# Patient Record
Sex: Female | Born: 1940
Health system: Southern US, Community
[De-identification: ages and names within clinical notes are randomized; demographics above are authoritative.]

## PROBLEM LIST (undated history)

## (undated) DIAGNOSIS — N289 Disorder of kidney and ureter, unspecified: Secondary | ICD-10-CM

## (undated) DIAGNOSIS — K829 Disease of gallbladder, unspecified: Secondary | ICD-10-CM

## (undated) DIAGNOSIS — C189 Malignant neoplasm of colon, unspecified: Secondary | ICD-10-CM

## (undated) DIAGNOSIS — E119 Type 2 diabetes mellitus without complications: Secondary | ICD-10-CM

## (undated) DIAGNOSIS — R112 Nausea with vomiting, unspecified: Secondary | ICD-10-CM

## (undated) DIAGNOSIS — I350 Nonrheumatic aortic (valve) stenosis: Secondary | ICD-10-CM

## (undated) DIAGNOSIS — R0600 Dyspnea, unspecified: Secondary | ICD-10-CM

## (undated) DIAGNOSIS — K760 Fatty (change of) liver, not elsewhere classified: Secondary | ICD-10-CM

## (undated) DIAGNOSIS — E039 Hypothyroidism, unspecified: Secondary | ICD-10-CM

## (undated) DIAGNOSIS — I10 Essential (primary) hypertension: Secondary | ICD-10-CM

## (undated) DIAGNOSIS — M810 Age-related osteoporosis without current pathological fracture: Secondary | ICD-10-CM

## (undated) DIAGNOSIS — T7840XA Allergy, unspecified, initial encounter: Secondary | ICD-10-CM

## (undated) DIAGNOSIS — F419 Anxiety disorder, unspecified: Secondary | ICD-10-CM

## (undated) DIAGNOSIS — Z8739 Personal history of other diseases of the musculoskeletal system and connective tissue: Secondary | ICD-10-CM

## (undated) DIAGNOSIS — R011 Cardiac murmur, unspecified: Secondary | ICD-10-CM

## (undated) DIAGNOSIS — M1990 Unspecified osteoarthritis, unspecified site: Secondary | ICD-10-CM

## (undated) DIAGNOSIS — D649 Anemia, unspecified: Secondary | ICD-10-CM

## (undated) DIAGNOSIS — Z9889 Other specified postprocedural states: Secondary | ICD-10-CM

## (undated) DIAGNOSIS — K219 Gastro-esophageal reflux disease without esophagitis: Secondary | ICD-10-CM

## (undated) HISTORY — DX: Disease of gallbladder, unspecified: K82.9

## (undated) HISTORY — DX: Disorder of kidney and ureter, unspecified: N28.9

## (undated) HISTORY — DX: Anxiety disorder, unspecified: F41.9

## (undated) HISTORY — DX: Fatty (change of) liver, not elsewhere classified: K76.0

## (undated) HISTORY — PX: TUBAL LIGATION: SHX77

## (undated) HISTORY — PX: CHOLECYSTECTOMY: SHX55

## (undated) HISTORY — DX: Allergy, unspecified, initial encounter: T78.40XA

## (undated) HISTORY — PX: CATARACT EXTRACTION: SUR2

## (undated) HISTORY — PX: KNEE SURGERY: SHX244

## (undated) HISTORY — DX: Age-related osteoporosis without current pathological fracture: M81.0

## (undated) HISTORY — PX: KNEE CARTILAGE SURGERY: SHX688

---

## 2000-01-03 ENCOUNTER — Other Ambulatory Visit: Admission: RE | Admit: 2000-01-03 | Discharge: 2000-01-03 | Payer: Self-pay | Admitting: Family Medicine

## 2000-09-26 ENCOUNTER — Encounter: Payer: Self-pay | Admitting: Family Medicine

## 2000-09-26 ENCOUNTER — Encounter: Admission: RE | Admit: 2000-09-26 | Discharge: 2000-09-26 | Payer: Self-pay | Admitting: Family Medicine

## 2001-11-11 ENCOUNTER — Encounter: Admission: RE | Admit: 2001-11-11 | Discharge: 2001-11-11 | Payer: Self-pay | Admitting: Family Medicine

## 2001-11-11 ENCOUNTER — Encounter: Payer: Self-pay | Admitting: Family Medicine

## 2002-12-10 ENCOUNTER — Encounter: Payer: Self-pay | Admitting: Family Medicine

## 2002-12-10 ENCOUNTER — Encounter: Admission: RE | Admit: 2002-12-10 | Discharge: 2002-12-10 | Payer: Self-pay | Admitting: Family Medicine

## 2002-12-29 ENCOUNTER — Other Ambulatory Visit: Admission: RE | Admit: 2002-12-29 | Discharge: 2002-12-29 | Payer: Self-pay | Admitting: Obstetrics and Gynecology

## 2004-02-04 ENCOUNTER — Encounter: Admission: RE | Admit: 2004-02-04 | Discharge: 2004-02-04 | Payer: Self-pay | Admitting: Family Medicine

## 2004-09-06 ENCOUNTER — Ambulatory Visit: Payer: Self-pay | Admitting: Family Medicine

## 2005-02-01 ENCOUNTER — Ambulatory Visit: Payer: Self-pay | Admitting: Family Medicine

## 2005-03-22 ENCOUNTER — Encounter: Admission: RE | Admit: 2005-03-22 | Discharge: 2005-03-22 | Payer: Self-pay | Admitting: Family Medicine

## 2005-03-22 ENCOUNTER — Ambulatory Visit: Payer: Self-pay | Admitting: Family Medicine

## 2005-05-03 ENCOUNTER — Other Ambulatory Visit: Admission: RE | Admit: 2005-05-03 | Discharge: 2005-05-03 | Payer: Self-pay | Admitting: Obstetrics and Gynecology

## 2005-07-05 ENCOUNTER — Ambulatory Visit: Payer: Self-pay | Admitting: Family Medicine

## 2005-10-11 ENCOUNTER — Ambulatory Visit: Payer: Self-pay | Admitting: Family Medicine

## 2005-11-08 ENCOUNTER — Ambulatory Visit: Payer: Self-pay | Admitting: Family Medicine

## 2006-02-28 ENCOUNTER — Ambulatory Visit: Payer: Self-pay | Admitting: Family Medicine

## 2006-05-21 ENCOUNTER — Ambulatory Visit: Payer: Self-pay | Admitting: Family Medicine

## 2006-06-04 ENCOUNTER — Ambulatory Visit: Payer: Self-pay | Admitting: Family Medicine

## 2006-06-27 ENCOUNTER — Encounter: Admission: RE | Admit: 2006-06-27 | Discharge: 2006-06-27 | Payer: Self-pay | Admitting: Family Medicine

## 2006-10-01 ENCOUNTER — Ambulatory Visit: Payer: Self-pay | Admitting: Family Medicine

## 2006-12-05 ENCOUNTER — Ambulatory Visit: Payer: Self-pay | Admitting: Family Medicine

## 2007-11-28 ENCOUNTER — Encounter: Admission: RE | Admit: 2007-11-28 | Discharge: 2007-11-28 | Payer: Self-pay | Admitting: Family Medicine

## 2009-03-16 DEATH — deceased

## 2009-05-31 ENCOUNTER — Encounter: Admission: RE | Admit: 2009-05-31 | Discharge: 2009-05-31 | Payer: Self-pay | Admitting: Family Medicine

## 2010-08-01 ENCOUNTER — Encounter
Admission: RE | Admit: 2010-08-01 | Discharge: 2010-08-01 | Payer: Self-pay | Source: Home / Self Care | Attending: Family Medicine | Admitting: Family Medicine

## 2011-02-07 ENCOUNTER — Emergency Department (HOSPITAL_COMMUNITY): Payer: Medicare Other

## 2011-02-07 ENCOUNTER — Emergency Department (HOSPITAL_COMMUNITY)
Admission: EM | Admit: 2011-02-07 | Discharge: 2011-02-08 | Disposition: A | Discharge status: Home / Self Care | Payer: Medicare Other | Attending: Emergency Medicine | Admitting: Emergency Medicine

## 2011-02-07 DIAGNOSIS — E039 Hypothyroidism, unspecified: Secondary | ICD-10-CM | POA: Insufficient documentation

## 2011-02-07 DIAGNOSIS — E119 Type 2 diabetes mellitus without complications: Secondary | ICD-10-CM | POA: Insufficient documentation

## 2011-02-07 DIAGNOSIS — R350 Frequency of micturition: Secondary | ICD-10-CM | POA: Insufficient documentation

## 2011-02-07 DIAGNOSIS — N200 Calculus of kidney: Secondary | ICD-10-CM | POA: Insufficient documentation

## 2011-02-07 DIAGNOSIS — R109 Unspecified abdominal pain: Secondary | ICD-10-CM | POA: Insufficient documentation

## 2011-02-07 DIAGNOSIS — Q618 Other cystic kidney diseases: Secondary | ICD-10-CM | POA: Insufficient documentation

## 2011-02-07 DIAGNOSIS — I10 Essential (primary) hypertension: Secondary | ICD-10-CM | POA: Insufficient documentation

## 2011-02-07 DIAGNOSIS — Z9089 Acquired absence of other organs: Secondary | ICD-10-CM | POA: Insufficient documentation

## 2011-02-07 LAB — CBC
HCT: 44.2 % (ref 36.0–46.0)
MCH: 31 pg (ref 26.0–34.0)
MCHC: 33 g/dL (ref 30.0–36.0)
RDW: 13.5 % (ref 11.5–15.5)

## 2011-02-07 LAB — URINALYSIS, ROUTINE W REFLEX MICROSCOPIC
Protein, ur: NEGATIVE mg/dL
Urobilinogen, UA: 0.2 mg/dL (ref 0.0–1.0)

## 2011-02-07 LAB — COMPREHENSIVE METABOLIC PANEL
Albumin: 4.1 g/dL (ref 3.5–5.2)
Alkaline Phosphatase: 122 U/L — ABNORMAL HIGH (ref 39–117)
BUN: 12 mg/dL (ref 6–23)
Calcium: 10 mg/dL (ref 8.4–10.5)
Creatinine, Ser: 0.9 mg/dL (ref 0.50–1.10)
GFR calc Af Amer: >60 mL/min (ref >60)
Potassium: 4 mEq/L (ref 3.5–5.1)
Total Protein: 7.9 g/dL (ref 6.0–8.3)

## 2011-02-07 LAB — DIFFERENTIAL
Basophils Absolute: 0 10*3/uL (ref 0.0–0.1)
Basophils Relative: 0 % (ref 0–1)
Eosinophils Relative: 1 % (ref 0–5)
Lymphocytes Relative: 41 % (ref 12–46)
Monocytes Absolute: 0.5 10*3/uL (ref 0.1–1.0)
Monocytes Relative: 6 % (ref 3–12)

## 2011-02-07 LAB — URINE MICROSCOPIC-ADD ON

## 2011-02-07 LAB — LIPASE, BLOOD: Lipase: 34 U/L (ref 11–59)

## 2011-10-16 ENCOUNTER — Encounter (HOSPITAL_COMMUNITY): Payer: Self-pay | Admitting: Emergency Medicine

## 2011-10-16 ENCOUNTER — Inpatient Hospital Stay (HOSPITAL_COMMUNITY)
Admission: EM | Admit: 2011-10-16 | Discharge: 2011-10-18 | DRG: 684 | Disposition: A | Discharge status: Home / Self Care | Payer: Medicare Other | Attending: Internal Medicine | Admitting: Internal Medicine

## 2011-10-16 DIAGNOSIS — I10 Essential (primary) hypertension: Secondary | ICD-10-CM | POA: Diagnosis present

## 2011-10-16 DIAGNOSIS — R5381 Other malaise: Secondary | ICD-10-CM | POA: Diagnosis present

## 2011-10-16 DIAGNOSIS — E039 Hypothyroidism, unspecified: Secondary | ICD-10-CM | POA: Diagnosis present

## 2011-10-16 DIAGNOSIS — A084 Viral intestinal infection, unspecified: Secondary | ICD-10-CM | POA: Diagnosis present

## 2011-10-16 DIAGNOSIS — R7302 Impaired glucose tolerance (oral): Secondary | ICD-10-CM | POA: Diagnosis present

## 2011-10-16 DIAGNOSIS — R5383 Other fatigue: Secondary | ICD-10-CM | POA: Diagnosis present

## 2011-10-16 DIAGNOSIS — K219 Gastro-esophageal reflux disease without esophagitis: Secondary | ICD-10-CM | POA: Diagnosis present

## 2011-10-16 DIAGNOSIS — R7309 Other abnormal glucose: Secondary | ICD-10-CM | POA: Diagnosis present

## 2011-10-16 DIAGNOSIS — A088 Other specified intestinal infections: Secondary | ICD-10-CM | POA: Diagnosis present

## 2011-10-16 DIAGNOSIS — N179 Acute kidney failure, unspecified: Secondary | ICD-10-CM

## 2011-10-16 HISTORY — DX: Essential (primary) hypertension: I10

## 2011-10-16 HISTORY — DX: Other specified postprocedural states: Z98.890

## 2011-10-16 HISTORY — DX: Nausea with vomiting, unspecified: R11.2

## 2011-10-16 LAB — DIFFERENTIAL
Basophils Absolute: 0 10*3/uL (ref 0.0–0.1)
Basophils Relative: 0 % (ref 0–1)
Eosinophils Absolute: 0.1 10*3/uL (ref 0.0–0.7)
Eosinophils Relative: 1 % (ref 0–5)
Lymphocytes Relative: 34 % (ref 12–46)
Lymphs Abs: 2.7 10*3/uL (ref 0.7–4.0)
Monocytes Absolute: 0.5 10*3/uL (ref 0.1–1.0)
Monocytes Relative: 6 % (ref 3–12)
Neutro Abs: 4.6 10*3/uL (ref 1.7–7.7)
Neutrophils Relative %: 58 % (ref 43–77)

## 2011-10-16 LAB — BASIC METABOLIC PANEL
BUN: 51 mg/dL — ABNORMAL HIGH (ref 6–23)
CO2: 18 mEq/L — ABNORMAL LOW (ref 19–32)
Calcium: 8.9 mg/dL (ref 8.4–10.5)
Chloride: 102 mEq/L (ref 96–112)
Creatinine, Ser: 2.22 mg/dL — ABNORMAL HIGH (ref 0.50–1.10)
GFR calc Af Amer: 25 mL/min — ABNORMAL LOW (ref >90)
GFR calc non Af Amer: 21 mL/min — ABNORMAL LOW (ref >90)
Glucose, Bld: 201 mg/dL — ABNORMAL HIGH (ref 70–99)
Potassium: 3.5 mEq/L (ref 3.5–5.1)
Sodium: 136 mEq/L (ref 135–145)

## 2011-10-16 LAB — CBC
HCT: 44.4 % (ref 36.0–46.0)
Hemoglobin: 14.8 g/dL (ref 12.0–15.0)
MCH: 31.8 pg (ref 26.0–34.0)
MCHC: 33.3 g/dL (ref 30.0–36.0)
MCV: 95.3 fL (ref 78.0–100.0)
Platelets: 186 10*3/uL (ref 150–400)
RBC: 4.66 MIL/uL (ref 3.87–5.11)
RDW: 14.3 % (ref 11.5–15.5)
WBC: 7.9 10*3/uL (ref 4.0–10.5)

## 2011-10-16 LAB — GLUCOSE, CAPILLARY: Glucose-Capillary: 106 mg/dL — ABNORMAL HIGH (ref 70–99)

## 2011-10-16 MED ORDER — LEVOTHYROXINE SODIUM 88 MCG PO TABS
88.0000 ug | ORAL_TABLET | Freq: Every day | ORAL | Status: DC
  Administered 2011-10-17 – 2011-10-18 (×2): 88 ug via ORAL
  Filled 2011-10-16 (×2): qty 1

## 2011-10-16 MED ORDER — ENOXAPARIN SODIUM 30 MG/0.3ML ~~LOC~~ SOLN
30.0000 mg | SUBCUTANEOUS | Status: DC
  Administered 2011-10-17: 30 mg via SUBCUTANEOUS
  Filled 2011-10-16 (×3): qty 0.3

## 2011-10-16 MED ORDER — ACETAMINOPHEN 325 MG PO TABS: 650.0000 mg | ORAL_TABLET | Freq: Four times a day (QID) | ORAL | Status: DC | PRN

## 2011-10-16 MED ORDER — SODIUM CHLORIDE 0.9 % IV SOLN
INTRAVENOUS | Status: DC
  Administered 2011-10-16 – 2011-10-17 (×5): via INTRAVENOUS

## 2011-10-16 MED ORDER — ONDANSETRON HCL 4 MG PO TABS: 4.0000 mg | ORAL_TABLET | Freq: Four times a day (QID) | ORAL | Status: DC | PRN

## 2011-10-16 MED ORDER — MORPHINE SULFATE 4 MG/ML IJ SOLN
4.0000 mg | Freq: Once | INTRAMUSCULAR | Status: AC
  Administered 2011-10-16: 4 mg via INTRAVENOUS
  Filled 2011-10-16: qty 1

## 2011-10-16 MED ORDER — SODIUM CHLORIDE 0.9 % IV BOLUS (SEPSIS)
1000.0000 mL | Freq: Once | INTRAVENOUS | Status: AC
  Administered 2011-10-16: 1000 mL via INTRAVENOUS

## 2011-10-16 MED ORDER — SODIUM CHLORIDE 0.9 % IV SOLN
1000.0000 mL | Freq: Once | INTRAVENOUS | Status: AC
  Administered 2011-10-16: 1000 mL via INTRAVENOUS

## 2011-10-16 MED ORDER — ACETAMINOPHEN 650 MG RE SUPP: 650.0000 mg | Freq: Four times a day (QID) | RECTAL | Status: DC | PRN

## 2011-10-16 MED ORDER — ONDANSETRON HCL 4 MG/2ML IJ SOLN
4.0000 mg | Freq: Four times a day (QID) | INTRAMUSCULAR | Status: DC | PRN
  Administered 2011-10-16: 4 mg via INTRAVENOUS
  Filled 2011-10-16: qty 2

## 2011-10-16 MED ORDER — ONDANSETRON 8 MG/NS 50 ML IVPB
8.0000 mg | Freq: Once | INTRAVENOUS | Status: AC
  Administered 2011-10-16: 8 mg via INTRAVENOUS
  Filled 2011-10-16: qty 8

## 2011-10-16 MED ORDER — OXYCODONE HCL 5 MG PO TABS: 5.0000 mg | ORAL_TABLET | ORAL | Status: DC | PRN

## 2011-10-16 MED ORDER — PANTOPRAZOLE SODIUM 40 MG PO TBEC
40.0000 mg | DELAYED_RELEASE_TABLET | Freq: Every day | ORAL | Status: DC
  Administered 2011-10-16 – 2011-10-18 (×3): 40 mg via ORAL
  Filled 2011-10-16 (×3): qty 1

## 2011-10-16 NOTE — ED Notes (Signed)
Pt states she has had N/V/D since Saturday night  Pt states when she drinks anything it makes her stomach hurt and it goes straight thru her  Pt states she just feels sick all over

## 2011-10-16 NOTE — Progress Notes (Signed)
   CARE MANAGEMENT NOTE 10/16/2011  Patient:  Cheryl Jordan, Cheryl Jordan   Account Number:  [id number]  Date Initiated:  10/16/2011  Documentation initiated by:  [facility]  Subjective/Objective Assessment:   ADMITTED W/N/V/D.HX:HTN     Action/Plan:   FROM HOME W/SPOUSE   Anticipated DC Date:  10/19/2011   Anticipated DC Plan:  HOME/SELF CARE         Choice offered to / List presented to:             Status of service:  In process, will continue to follow Medicare Important Message given?   (If response is "NO", the following Medicare IM given date fields will be blank) Date Medicare IM given:   Date Additional Medicare IM given:    Discharge Disposition:    Per UR Regulation:  Reviewed for med. necessity/level of care/duration of stay  If discussed at [place] of Stay Meetings, dates discussed:    Comments:  10/16/11 [facility] RN,BSN NCM [id number]

## 2011-10-16 NOTE — H&P (Addendum)
PCP:   Dr. Dione Housekeeper   Chief Complaint:  N/V/D  HPI: Cheryl 71 y/o white Jordan with a h/o HTN, GERD, Hypothyroidism and borderline DM, has been having n/v and diarrhea since Saturday night. Her husband and sister in law have been sick as well with the same symptoms. The vomiting has now stopped, however she continues to have over 18 episodes of watery diarrhea a day. She comes to the ED to seek assistance for her GI symptoms. She is found to have ARF with a Cr of 2.22. [place] measured Cr in the system is of 0.9 in 7/12. We are asked to admit her for further evaluation and management.  Allergies:   Allergies  Allergen Reactions  . Aspirin   . Codeine       Past Medical History  Diagnosis Date  . Hypertension   . Thyroid disease   . Borderline diabetes     Past Surgical History  Procedure Date  . Cholecystectomy   . Knee surgery   . Tubal ligation     Prior to Admission medications   Medication Sig Start Date End Date Taking? Authorizing Provider  amLODipine (NORVASC) 5 MG tablet Take 5 mg by mouth daily.   Yes Historical Provider, MD  atenolol (TENORMIN) 50 MG tablet Take 50 mg by mouth daily.   Yes Historical Provider, MD  levothyroxine (SYNTHROID, LEVOTHROID) 88 MCG tablet Take 88 mcg by mouth daily.   Yes Historical Provider, MD  losartan (COZAAR) 50 MG tablet Take 25 mg by mouth daily.   Yes Historical Provider, MD  metFORMIN (GLUCOPHAGE) 500 MG tablet Take 500 mg by mouth 2 (two) times daily with a meal.   Yes Historical Provider, MD  omeprazole (PRILOSEC) 40 MG capsule Take 40 mg by mouth daily.   Yes Historical Provider, MD  rosuvastatin (CRESTOR) 5 MG tablet Take 2.5 mg by mouth daily.   Yes Historical Provider, MD    Social History:  reports that she has never smoked. She does not have any smokeless tobacco history on file. She reports that she does not drink alcohol or use illicit drugs.  Family History  Problem Relation Age of Onset  . Diabetes Mother     . Hypertension Other     Review of Systems:  Negative except as mentioned in HPI.  Physical Exam: Blood pressure 122/51, pulse 79, temperature 97.8 F (36.6 C), temperature source Oral, resp. rate 17, SpO2 91.00%. Gen: AAOx3, no distress. HEENT: [place]/AT/PERRL/EOMI, very dry mucous membranes. Neck: supple, no JVD, no LAD, no bruits, no goiter. CV: RRR, no M/R/G. Lungs: CTA B Abdomen: S/NT/ND/+BS/ no masses or organomegaly noted. Ext: no C/C/E, + pedal pulses. Neuro: grossly intact and nonfocal.  Labs on Admission:  Results for orders placed during the hospital encounter of 10/16/11 (from the past 48 hour(s))  CBC     Status: Normal   Collection Time   10/16/11  4:35 AM      Component Value Range Comment   WBC 7.9  4.0 - 10.5 (K/uL)    RBC 4.66  3.87 - 5.11 (MIL/uL)    Hemoglobin 14.8  12.0 - 15.0 (g/dL)    HCT 44.4  36.0 - 46.0 (%)    MCV 95.3  78.0 - 100.0 (fL)    MCH 31.8  26.0 - 34.0 (pg)    MCHC 33.3  30.0 - 36.0 (g/dL)    RDW 14.3  11.5 - 15.5 (%)    Platelets 186  150 - 400 (K/uL)  DIFFERENTIAL     Status: Normal   Collection Time   10/16/11  4:35 AM      Component Value Range Comment   Neutrophils Relative 58  43 - 77 (%)    Neutro Abs 4.6  1.7 - 7.7 (K/uL)    Lymphocytes Relative 34  12 - 46 (%)    Lymphs Abs 2.7  0.7 - 4.0 (K/uL)    Monocytes Relative 6  3 - 12 (%)    Monocytes Absolute 0.5  0.1 - 1.0 (K/uL)    Eosinophils Relative 1  0 - 5 (%)    Eosinophils Absolute 0.1  0.0 - 0.7 (K/uL)    Basophils Relative 0  0 - 1 (%)    Basophils Absolute 0.0  0.0 - 0.1 (K/uL)   BASIC METABOLIC PANEL     Status: Abnormal   Collection Time   10/16/11  4:35 AM      Component Value Range Comment   Sodium 136  135 - 145 (mEq/L)    Potassium 3.5  3.5 - 5.1 (mEq/L)    Chloride 102  96 - 112 (mEq/L)    CO2 18 (*) 19 - 32 (mEq/L)    Glucose, Bld 201 (*) 70 - 99 (mg/dL)    BUN 51 (*) 6 - 23 (mg/dL)    Creatinine, Ser 2.22 (*) 0.50 - 1.10 (mg/dL)    Calcium 8.9  8.4 - 10.5  (mg/dL)    GFR calc non Af Amer 21 (*) >90 (mL/min)    GFR calc Af Amer 25 (*) >90 (mL/min)     Radiological Exams on Admission: No results found.  Assessment/Plan Principal Problem:  *ARF (acute renal failure) Active Problems:  Viral gastroenteritis  HTN (hypertension)  Hypothyroidism  GERD (gastroesophageal reflux disease)  Glucose intolerance (impaired glucose tolerance)   #1 ARF: Likely prerenal 2/2 GI losses on top of ARB. Hydrate with NS and follow Cr in am. Hold losartan.  #2 N/V/Diarrhea: Likely viral gastroenteritis. Should be self limiting. Follow.  #3 HTN: hold all anti-hypertensive meds.  #4 Hypothyroidism: Check TSH. Continue home synthroid dose.  #5 Glucose Intolerance: Follow CBGs. Cover if needed.  #6 DVT Prophylaxis: Lovenox.  Time Spent on Admission: 60 minutes.  Lelon Frohlich [facility] Pager: [phone] 10/16/2011, 8:27 AM

## 2011-10-16 NOTE — ED Provider Notes (Cosign Needed)
History    70yf with n/v/d. Onset 2d ago. Persistent since. Mild crampy and diffuse abdominal pain. No radiation. No fever or chills. NO sick contacts. No urinary complaints. No CP or SOB. No blood in stool or emesis.  CSN: [id number]  Arrival date & time 10/16/11  [id number]   First MD Initiated Contact with Patient 10/16/11 0445      Chief Complaint  Patient presents with  . Emesis  . Diarrhea    (Consider location/radiation/quality/duration/timing/severity/associated sxs/prior treatment) HPI  Past Medical History  Diagnosis Date  . Hypertension   . Thyroid disease   . Borderline diabetes     Past Surgical History  Procedure Date  . Cholecystectomy   . Knee surgery   . Tubal ligation     Family History  Problem Relation Age of Onset  . Diabetes Mother   . Hypertension Other     History  Substance Use Topics  . Smoking status: Never Smoker   . Smokeless tobacco: Not on file  . Alcohol Use: No    OB History    Grav Para Term Preterm Abortions TAB SAB Ect Mult Living                  Review of Systems   Review of symptoms negative unless otherwise noted in HPI.   Allergies  Aspirin and Codeine  Home Medications   Current Outpatient Rx  Name Route Sig Dispense Refill  . AMLODIPINE BESYLATE 5 MG PO TABS Oral Take 5 mg by mouth daily.    . ATENOLOL 50 MG PO TABS Oral Take 50 mg by mouth daily.    Marland Kitchen LEVOTHYROXINE SODIUM 88 MCG PO TABS Oral Take 88 mcg by mouth daily.    Marland Kitchen LOSARTAN POTASSIUM 50 MG PO TABS Oral Take 25 mg by mouth daily.    Marland Kitchen METFORMIN HCL 500 MG PO TABS Oral Take 500 mg by mouth 2 (two) times daily with a meal.    . OMEPRAZOLE 40 MG PO CPDR Oral Take 40 mg by mouth daily.    Marland Kitchen ROSUVASTATIN CALCIUM 5 MG PO TABS Oral Take 2.5 mg by mouth daily.      BP 146/68  Pulse 87  Temp(Src) 97.8 F (36.6 C) (Oral)  Resp 20  SpO2 95%  Physical Exam  Nursing note and vitals reviewed. Constitutional:       Sitting up in bed. Uncomfortable  appearing. Obese.  HENT:  Head: Normocephalic and atraumatic.  Eyes: Conjunctivae are normal. Pupils are equal, round, and reactive to light. Right eye exhibits no discharge. Left eye exhibits no discharge.  Neck: Neck supple.  Cardiovascular: Normal rate, regular rhythm and normal heart sounds.  Exam reveals no gallop and no friction rub.   No murmur heard. Pulmonary/Chest: Effort normal and breath sounds normal. No respiratory distress.  Abdominal: Soft. She exhibits no distension. There is tenderness.       Mild diffuse tenderness without rebound or guarding.  Musculoskeletal: She exhibits no edema and no tenderness.  Neurological: She is alert.  Skin: Skin is warm and dry. She is not diaphoretic.  Psychiatric: She has a normal mood and affect. Her behavior is normal. Thought content normal.    ED Course  Procedures (including critical care time)  Labs Reviewed  BASIC METABOLIC PANEL - Abnormal; Notable for the following:    CO2 18 (*)    Glucose, Bld 201 (*)    BUN 51 (*)    Creatinine, Ser 2.22 (*)  GFR calc non Af Amer 21 (*)    GFR calc Af Amer 25 (*)    All other components within normal limits  CBC  DIFFERENTIAL   No results found.   1. Acute renal failure   2. Vomiting 3. Diarrhea 4. Dehydration   MDM  70yF with n/v/d. Suspect viral illness. Presumably acute renal failure, likely prerenal from GI loss. Given age and ongoing symptoms making oral tolerance difficult at this time will admit.        Virgel Manifold, MD 10/18/11 [id number]  Virgel Manifold, MD 10/18/11 [phone]

## 2011-10-17 LAB — CBC
MCH: 30.8 pg (ref 26.0–34.0)
MCHC: 31.9 g/dL (ref 30.0–36.0)
MCV: 96.5 fL (ref 78.0–100.0)
Platelets: 133 10*3/uL — ABNORMAL LOW (ref 150–400)
RDW: 14.2 % (ref 11.5–15.5)
WBC: 5.5 10*3/uL (ref 4.0–10.5)

## 2011-10-17 LAB — BASIC METABOLIC PANEL
BUN: 30 mg/dL — ABNORMAL HIGH (ref 6–23)
CO2: 19 mEq/L (ref 19–32)
Calcium: 8.1 mg/dL — ABNORMAL LOW (ref 8.4–10.5)
Creatinine, Ser: 1.3 mg/dL — ABNORMAL HIGH (ref 0.50–1.10)

## 2011-10-17 LAB — GLUCOSE, CAPILLARY: Glucose-Capillary: 116 mg/dL — ABNORMAL HIGH (ref 70–99)

## 2011-10-17 LAB — CLOSTRIDIUM DIFFICILE BY PCR: Toxigenic C. Difficile by PCR: NEGATIVE

## 2011-10-17 MED ORDER — ATENOLOL 50 MG PO TABS
50.0000 mg | ORAL_TABLET | Freq: Every day | ORAL | Status: DC
  Administered 2011-10-17 – 2011-10-18 (×2): 50 mg via ORAL
  Filled 2011-10-17 (×2): qty 1

## 2011-10-17 MED ORDER — INSULIN ASPART 100 UNIT/ML ~~LOC~~ SOLN: 0.0000 [IU] | Freq: Every day | SUBCUTANEOUS | Status: DC

## 2011-10-17 MED ORDER — INSULIN ASPART 100 UNIT/ML ~~LOC~~ SOLN: 0.0000 [IU] | Freq: Three times a day (TID) | SUBCUTANEOUS | Status: DC

## 2011-10-17 NOTE — Progress Notes (Addendum)
Patient ID: Cheryl Jordan, female   DOB: 08/12/1940, 71 y.o.   MRN: [id number]  Assessment/Plan:  Principal Problem:   *ARF (acute renal failure) - secondary to dehydration versus ARB - losartan on hold - continue IV fluids for now - creatinine improving - follow up BMP in am  Active Problems:   Viral gastroenteritis - patient continue to have diarrhea but reports feeling better - continue IV fluids - follow up C. Diff results   HTN (hypertension) - restart atenolol 50 mg daily - hold losartan   Hypothyroidism - continue levothyroxine   GERD (gastroesophageal reflux disease) - continue protonix   Glucose intolerance (impaired glucose tolerance) - sliding scale insulin - continue CBG monitoring - hold metformin in light of acute renal failure and possible infection   EDUCATION - test results and diagnostic studies were discussed with patient and pt's family who was present at the bedside - patient and family have verbalized the understanding - questions were answered at the bedside and contact information was provided for additional questions or concerns   Subjective: No events overnight. Patient denies chest pain, shortness of breath, abdominal pain. Continues to have diarrhea.  Objective:  Vital signs in last 24 hours:  Filed Vitals:   10/16/11 1135 10/16/11 1435 10/16/11 2105 10/17/11 0605  BP: 110/66 123/71 129/70 135/75  Pulse: 71 80 75 77  Temp: 97.9 F (36.6 C) 98 F (36.7 C) 98 F (36.7 C) 98 F (36.7 C)  TempSrc: Oral Oral Oral Oral  Resp: 16 18 18 18   Height: 5\' 8"  (1.727 m)     Weight: 95.255 kg (210 lb)     SpO2: 93% 94% 92% 93%    Intake/Output from previous day:   Intake/Output Summary (Last 24 hours) at 10/17/11 1158 Last data filed at 10/17/11 0600  Gross per 24 hour  Intake    587 ml  Output      1 ml  Net    586 ml    Physical Exam: General: Alert, awake, oriented x3, in no acute distress. HEENT: No bruits, no goiter. Moist  mucous membranes, no scleral icterus, no conjunctival pallor. Heart: Regular rate and rhythm, S1/S2 +, no murmurs, rubs, gallops. Lungs: Clear to auscultation bilaterally. No wheezing, no rhonchi, no rales.  Abdomen: Soft, nontender, nondistended, positive bowel sounds. Extremities: No clubbing or cyanosis, no pitting edema,  positive pedal pulses. Neuro: Grossly nonfocal.  Lab Results:  Lab 10/17/11 0415 10/16/11 0435  WBC 5.5 7.9  HGB 12.2 14.8  HCT 38.2 44.4  PLT 133* 186  MCV 96.5 95.3    Lab 10/17/11 0415 10/16/11 0435  NA 138 136  K 3.9 3.5  CL 111 102  CO2 19 18*  GLUCOSE 109* 201*  BUN 30* 51*  CREATININE 1.30* 2.22*  CALCIUM 8.1* 8.9     Medications: Scheduled Meds:   . enoxaparin  30 mg Subcutaneous Q24H  . levothyroxine  88 mcg Oral QAC breakfast  . pantoprazole  40 mg Oral Q1200   Continuous Infusions:   . sodium chloride 125 mL/hr at 10/17/11 0720   PRN Meds:.acetaminophen, acetaminophen, ondansetron (ZOFRAN) IV, ondansetron, oxyCODONE   LOS: 1 day   Cheryl Jordan 10/17/2011, 11:58 AM  [facility] Pager: [phone]

## 2011-10-17 NOTE — Progress Notes (Signed)
Pt w/o nausea/vomiting.Tolerating Clear Liquid Diet.  Diet advanced to Full Liquid.Harlene Ramus

## 2011-10-18 LAB — CBC
MCH: 31.3 pg (ref 26.0–34.0)
MCHC: 33.3 g/dL (ref 30.0–36.0)
MCV: 93.8 fL (ref 78.0–100.0)
Platelets: 127 10*3/uL — ABNORMAL LOW (ref 150–400)
RDW: 13.7 % (ref 11.5–15.5)

## 2011-10-18 LAB — BASIC METABOLIC PANEL
Calcium: 8.6 mg/dL (ref 8.4–10.5)
Creatinine, Ser: 0.94 mg/dL (ref 0.50–1.10)
GFR calc non Af Amer: 61 mL/min — ABNORMAL LOW (ref >90)
Sodium: 141 mEq/L (ref 135–145)

## 2011-10-18 MED ORDER — ENOXAPARIN SODIUM 40 MG/0.4ML ~~LOC~~ SOLN
40.0000 mg | SUBCUTANEOUS | Status: DC
  Filled 2011-10-18: qty 0.4

## 2011-10-18 NOTE — Discharge Summary (Signed)
Patient ID: Cheryl Jordan MRN: [id number] DOB/AGE: 1941-03-13 71 y.o.  Admit date: 10/16/2011 Discharge date: 10/18/2011  Primary Care Physician:  No primary provider on file.  Discharge Diagnoses:    Present on Admission:  .ARF (acute renal failure) .Viral gastroenteritis .HTN (hypertension) .Hypothyroidism .GERD (gastroesophageal reflux disease) .Glucose intolerance (impaired glucose tolerance)  Principal Problem:  *ARF (acute renal failure) Active Problems:  Viral gastroenteritis  HTN (hypertension)  Hypothyroidism  GERD (gastroesophageal reflux disease)  Glucose intolerance (impaired glucose tolerance)   Medication List  As of 10/18/2011  8:42 AM   TAKE these medications         amLODipine 5 MG tablet   Commonly known as: NORVASC   Take 5 mg by mouth daily.      atenolol 50 MG tablet   Commonly known as: TENORMIN   Take 50 mg by mouth daily.      levothyroxine 88 MCG tablet   Commonly known as: SYNTHROID, LEVOTHROID   Take 88 mcg by mouth daily.      losartan 50 MG tablet   Commonly known as: COZAAR   Take 25 mg by mouth daily.      metFORMIN 500 MG tablet   Commonly known as: GLUCOPHAGE   Take 500 mg by mouth 2 (two) times daily with a meal.      omeprazole 40 MG capsule   Commonly known as: PRILOSEC   Take 40 mg by mouth daily.      rosuvastatin 5 MG tablet   Commonly known as: CRESTOR   Take 2.5 mg by mouth daily.            Disposition and Follow-up:  - medically stable for discharge - please follow up with PCP to re evaluate kidney function and whether losartan and metformin can be continued for now  Consults:  None requested on this admission  Significant Diagnostic Studies:  No results found.  Brief H and P:  71 y/o white woman with a h/o HTN, GERD, Hypothyroidism and borderline DM, presented with intractable nausea and vomiting and diarrhea. Patient reports multiple episodes of diarrhea. Patient also reported feeling very weak few days  prior to admission. Admitted to hospitalist service for further evaluation and management.  Physical Exam on Discharge:  Filed Vitals:   10/17/11 0605 10/17/11 1335 10/17/11 2210 10/18/11 0352  BP: 135/75 155/77 125/68 134/63  Pulse: 77 74 62 62  Temp: 98 F (36.7 C) 97.8 F (36.6 C) 98.3 F (36.8 C) 98.4 F (36.9 C)  TempSrc: Oral Oral Oral Oral  Resp: 18 18 17 18   Height:      Weight:      SpO2: 93% 95% 95% 95%     Intake/Output Summary (Last 24 hours) at 10/18/11 0842 Last data filed at 10/17/11 1400  Gross per 24 hour  Intake    867 ml  Output      0 ml  Net    867 ml    General: Alert, awake, oriented x3, in no acute distress. HEENT: No bruits, no goiter. Heart: Regular rate and rhythm, without murmurs, rubs, gallops. Lungs: Clear to auscultation bilaterally. Abdomen: Soft, nontender, nondistended, positive bowel sounds. Extremities: No clubbing cyanosis or edema with positive pedal pulses. Neuro: Grossly intact, nonfocal.  CBC:    Component Value Date/Time   WBC 4.5 10/18/2011 0814   HGB 12.6 10/18/2011 0814   HCT 37.8 10/18/2011 0814   PLT 127* 10/18/2011 0814   MCV 93.8 10/18/2011 0814   NEUTROABS  4.6 10/16/2011 0435   LYMPHSABS 2.7 10/16/2011 0435   MONOABS 0.5 10/16/2011 0435   EOSABS 0.1 10/16/2011 0435   BASOSABS 0.0 10/16/2011 [id number]    Basic Metabolic Panel:    Component Value Date/Time   NA 138 10/17/2011 0415   K 3.9 10/17/2011 0415   CL 111 10/17/2011 0415   CO2 19 10/17/2011 0415   BUN 30* 10/17/2011 0415   CREATININE 1.30* 10/17/2011 0415   GLUCOSE 109* 10/17/2011 0415   CALCIUM 8.1* 10/17/2011 0415   Assessment/Plan:   Principal Problem:   *ARF (acute renal failure)  - secondary to dehydration versus ARB  - losartan was on hold; may resume on discharge but to follow up with PCP to re evaluate kidney function  - creatinine improving   Active Problems:   Viral gastroenteritis  - patient reports feeling better with no episodes of diarrhea in  last 12 hours -  follow up C. Diff results negative  HTN (hypertension)  - restart atenolol 50 mg daily   Hypothyroidism  - continue levothyroxine   GERD (gastroesophageal reflux disease)  - continue protonix   Glucose intolerance (impaired glucose tolerance)  - held metformin in light of acute renal failure and possible infection  - may resume on discharge but with follow up  With PCP to reassess kidney function  EDUCATION  - test results and diagnostic studies were discussed with patient and pt's family who was present at the bedside  - patient and family have verbalized the understanding  - questions were answered at the bedside    Time spent on Discharge: Greater than 30 minutes spent on coordinating the care and preparing discharge summary  Signed: [place], [place] 10/18/2011, 8:42 AM

## 2011-10-18 NOTE — Discharge Instructions (Signed)
Viral Gastroenteritis Viral gastroenteritis is also known as stomach flu. This condition affects the stomach and intestinal tract. It can cause sudden diarrhea and vomiting. The illness typically lasts 3 to 8 days. Most people develop an immune response that eventually gets rid of the virus. While this natural response develops, the virus can make you quite ill. CAUSES  Many different viruses can cause gastroenteritis, such as rotavirus or noroviruses. You can catch one of these viruses by consuming contaminated food or water. You may also catch a virus by sharing utensils or other personal items with an infected person or by touching a contaminated surface. SYMPTOMS  The most common symptoms are diarrhea and vomiting. These problems can cause a severe loss of body fluids (dehydration) and a body salt (electrolyte) imbalance. Other symptoms may include:  Fever.   Headache.   Fatigue.   Abdominal pain.  DIAGNOSIS  Your caregiver can usually diagnose viral gastroenteritis based on your symptoms and a physical exam. A stool sample may also be taken to test for the presence of viruses or other infections. TREATMENT  This illness typically goes away on its own. Treatments are aimed at rehydration. The most serious cases of viral gastroenteritis involve vomiting so severely that you are not able to keep fluids down. In these cases, fluids must be given through an intravenous line (IV). HOME CARE INSTRUCTIONS   Drink enough fluids to keep your urine clear or pale yellow. Drink small amounts of fluids frequently and increase the amounts as tolerated.   Ask your caregiver for specific rehydration instructions.   Avoid:   Foods high in sugar.   Alcohol.   Carbonated drinks.   Tobacco.   Juice.   Caffeine drinks.   Extremely hot or cold fluids.   Fatty, greasy foods.   Too much intake of anything at one time.   Dairy products until 24 to 48 hours after diarrhea stops.   You may  consume probiotics. Probiotics are active cultures of beneficial bacteria. They may lessen the amount and number of diarrheal stools in adults. Probiotics can be found in yogurt with active cultures and in supplements.   Wash your hands well to avoid spreading the virus.   Only take over-the-counter or prescription medicines for pain, discomfort, or fever as directed by your caregiver. Do not give aspirin to children. Antidiarrheal medicines are not recommended.   Ask your caregiver if you should continue to take your regular prescribed and over-the-counter medicines.   Keep all follow-up appointments as directed by your caregiver.  SEEK IMMEDIATE MEDICAL CARE IF:   You are unable to keep fluids down.   You do not urinate at least once every 6 to 8 hours.   You develop shortness of breath.   You notice blood in your stool or vomit. This may look like coffee grounds.   You have abdominal pain that increases or is concentrated in one small area (localized).   You have persistent vomiting or diarrhea.   You have a fever.   The patient is a child younger than 3 months, and he or she has a fever.   The patient is a child older than 3 months, and he or she has a fever and persistent symptoms.   The patient is a child older than 3 months, and he or she has a fever and symptoms suddenly get worse.   The patient is a baby, and he or she has no tears when crying.  MAKE SURE YOU:     Understand these instructions.   Will watch your condition.   Will get help right away if you are not doing well or get worse.  Document Released: 07/02/2005 Document Revised: 06/21/2011 Document Reviewed: 04/18/2011 [employer] Patient Information 2012 [employer]. 

## 2011-10-18 NOTE — Evaluation (Signed)
Physical Therapy Evaluation Patient Details Name: Cheryl Jordan MRN: [id number] DOB: January 07, 1941 Today's Date: 10/18/2011  Problem List:  Patient Active Problem List  Diagnoses  . ARF (acute renal failure)  . Viral gastroenteritis  . HTN (hypertension)  . Hypothyroidism  . GERD (gastroesophageal reflux disease)  . Glucose intolerance (impaired glucose tolerance)    Past Medical History:  Past Medical History  Diagnosis Date  . Hypertension   . Thyroid disease   . Borderline diabetes   . PONV (postoperative nausea and vomiting)    Past Surgical History:  Past Surgical History  Procedure Date  . Cholecystectomy   . Knee surgery   . Tubal ligation     PT Assessment/Plan/Recommendation PT Assessment Clinical Impression Statement: Pt presents with diagnosis of diarrhea and ARF. 1x eval. No follow-up needed. PT Recommendation/Assessment: Patent does not need any further PT services No Skilled PT: Patient is independent with all acitivity/mobility PT Recommendation Follow Up Recommendations: No PT follow up Equipment Recommended: None recommended by PT PT Goals     PT Evaluation Precautions/Restrictions    Prior Functioning  Home Living Lives With: Spouse Receives Help From: Family Type of Home: House Home Layout: Laundry or work area in basement;Two level;Able to live on main level with bedroom/bathroom Home Adaptive Equipment: Walker - rolling Additional Comments: pt not using RW Prior Function Level of Independence: Independent with basic ADLs;Independent with homemaking with ambulation;Independent with gait;Independent with transfers Driving: Yes Cognition Cognition Arousal/Alertness: Awake/alert Overall Cognitive Status: Appears within functional limits for tasks assessed Orientation Level: Oriented X4 Sensation/Coordination Sensation Light Touch: Appears Intact Coordination Gross Motor Movements are Fluid and Coordinated: Yes Extremity Assessment RLE  Assessment RLE Assessment: Within Functional Limits LLE Assessment LLE Assessment: Within Functional Limits Mobility (including Balance) Bed Mobility Bed Mobility: Yes Supine to Sit: 7: Independent Transfers Transfers: Yes Sit to Stand: 7: Independent Stand to Sit: 7: Independent Ambulation/Gait Ambulation/Gait: Yes Ambulation/Gait Assistance: 7: Independent Ambulation Distance (Feet): 450 Feet Assistive device: None Gait Pattern: Within Functional Limits  Posture/Postural Control Posture/Postural Control: No significant limitations Balance Balance Assessed: No Exercise    End of Session PT - End of Session Equipment Utilized During Treatment: Gait belt Activity Tolerance: Patient tolerated treatment well Patient left: in chair;with call bell in reach General Behavior During Session: [facility] for tasks performed Cognition: [facility] for tasks performed  [place]on [facility] 10/18/2011, 11:00 AM [id number]

## 2011-10-22 LAB — GLUCOSE, CAPILLARY
Glucose-Capillary: 134 mg/dL — ABNORMAL HIGH (ref 70–99)
Glucose-Capillary: 124 mg/dL — ABNORMAL HIGH (ref 70–99)
Glucose-Capillary: 141 mg/dL — ABNORMAL HIGH (ref 70–99)

## 2011-10-24 ENCOUNTER — Other Ambulatory Visit: Payer: Self-pay | Admitting: Family Medicine

## 2011-10-24 DIAGNOSIS — Z1231 Encounter for screening mammogram for malignant neoplasm of breast: Secondary | ICD-10-CM

## 2011-11-19 ENCOUNTER — Ambulatory Visit
Admission: RE | Admit: 2011-11-19 | Discharge: 2011-11-19 | Disposition: A | Discharge status: Home / Self Care | Payer: Medicare Other | Source: Ambulatory Visit | Attending: Family Medicine | Admitting: Family Medicine

## 2011-11-19 DIAGNOSIS — Z1231 Encounter for screening mammogram for malignant neoplasm of breast: Secondary | ICD-10-CM

## 2013-01-06 ENCOUNTER — Other Ambulatory Visit: Payer: Self-pay

## 2013-01-06 DIAGNOSIS — Z1231 Encounter for screening mammogram for malignant neoplasm of breast: Secondary | ICD-10-CM

## 2013-01-20 ENCOUNTER — Ambulatory Visit: Payer: Medicare Other

## 2013-01-30 ENCOUNTER — Ambulatory Visit
Admission: RE | Admit: 2013-01-30 | Discharge: 2013-01-30 | Disposition: A | Discharge status: Home / Self Care | Payer: Medicare Other | Source: Ambulatory Visit

## 2013-01-30 DIAGNOSIS — Z1231 Encounter for screening mammogram for malignant neoplasm of breast: Secondary | ICD-10-CM

## 2013-12-17 DIAGNOSIS — M1990 Unspecified osteoarthritis, unspecified site: Secondary | ICD-10-CM | POA: Insufficient documentation

## 2013-12-17 DIAGNOSIS — E1159 Type 2 diabetes mellitus with other circulatory complications: Secondary | ICD-10-CM | POA: Insufficient documentation

## 2013-12-17 DIAGNOSIS — I152 Hypertension secondary to endocrine disorders: Secondary | ICD-10-CM | POA: Insufficient documentation

## 2014-11-18 ENCOUNTER — Other Ambulatory Visit: Payer: Self-pay | Admitting: Gastroenterology

## 2014-11-18 DIAGNOSIS — C180 Malignant neoplasm of cecum: Secondary | ICD-10-CM

## 2014-11-23 ENCOUNTER — Ambulatory Visit
Admission: RE | Admit: 2014-11-23 | Discharge: 2014-11-23 | Disposition: A | Discharge status: Home / Self Care | Payer: Medicare Other | Source: Ambulatory Visit | Attending: Gastroenterology | Admitting: Gastroenterology

## 2014-11-23 DIAGNOSIS — C180 Malignant neoplasm of cecum: Secondary | ICD-10-CM

## 2014-11-23 MED ORDER — IOHEXOL 300 MG/ML  SOLN: 100.0000 mL | Freq: Once | INTRAMUSCULAR | Status: AC | PRN

## 2014-11-24 ENCOUNTER — Other Ambulatory Visit: Payer: Self-pay | Admitting: Gastroenterology

## 2014-11-24 DIAGNOSIS — E611 Iron deficiency: Secondary | ICD-10-CM

## 2014-11-24 DIAGNOSIS — K219 Gastro-esophageal reflux disease without esophagitis: Principal | ICD-10-CM

## 2014-11-24 DIAGNOSIS — IMO0001 Reserved for inherently not codable concepts without codable children: Secondary | ICD-10-CM

## 2014-12-03 ENCOUNTER — Ambulatory Visit
Admission: RE | Admit: 2014-12-03 | Discharge: 2014-12-03 | Disposition: A | Discharge status: Home / Self Care | Payer: Medicare Other | Source: Ambulatory Visit | Attending: Gastroenterology | Admitting: Gastroenterology

## 2014-12-03 DIAGNOSIS — K219 Gastro-esophageal reflux disease without esophagitis: Principal | ICD-10-CM

## 2014-12-03 DIAGNOSIS — IMO0001 Reserved for inherently not codable concepts without codable children: Secondary | ICD-10-CM

## 2014-12-03 DIAGNOSIS — E611 Iron deficiency: Secondary | ICD-10-CM

## 2014-12-08 ENCOUNTER — Ambulatory Visit: Payer: Self-pay | Admitting: Surgery

## 2014-12-08 ENCOUNTER — Other Ambulatory Visit: Payer: Self-pay | Admitting: Surgery

## 2014-12-19 ENCOUNTER — Ambulatory Visit
Admission: RE | Admit: 2014-12-19 | Discharge: 2014-12-19 | Disposition: A | Discharge status: Home / Self Care | Payer: Medicare Other | Source: Ambulatory Visit | Attending: Gastroenterology | Admitting: Gastroenterology

## 2014-12-19 MED ORDER — GADOBENATE DIMEGLUMINE 529 MG/ML IV SOLN
20.0000 mL | Freq: Once | INTRAVENOUS | Status: AC | PRN
  Administered 2014-12-19: 20 mL via INTRAVENOUS

## 2014-12-31 NOTE — Patient Instructions (Addendum)
YOUR PROCEDURE IS SCHEDULED ON : 01/06/15  REPORT TO [facility] MAIN ENTRANCE FOLLOW SIGNS TO SHORT STAY CENTER AT :  5:30 AM  CALL THIS NUMBER IF YOU HAVE PROBLEMS THE MORNING OF SURGERY [phone]  REMEMBER:ONLY 1 PER PERSON MAY GO TO SHORT STAY WITH YOU TO GET READY THE MORNING OF YOUR SURGERY  DO NOT EAT FOOD OR DRINK LIQUIDS AFTER MIDNIGHT  TAKE THESE MEDICINES THE MORNING OF SURGERY:  AMLODIPINE / ATENOLOL / NEXIUM / LEVOTHYROXINE  STOP ASPIRIN / IBUPROFEN / ALEVE / VITAMINS / HERBAL MEDS __5__ DAYS BEFORE SURGERY  FOLLOW BOWEL PREP FROM OFFICE  YOU MAY NOT HAVE ANY METAL ON YOUR BODY INCLUDING HAIR PINS AND PIERCING'S. DO NOT WEAR JEWELRY, MAKEUP, LOTIONS, POWDERS OR PERFUMES. DO NOT WEAR NAIL POLISH. DO NOT SHAVE 48 HRS PRIOR TO SURGERY. MEN MAY SHAVE FACE AND NECK.  DO NOT [place]. [facility] IS NOT RESPONSIBLE FOR VALUABLES.  CONTACTS, DENTURES OR PARTIALS MAY NOT BE WORN TO SURGERY. LEAVE SUITCASE IN CAR. CAN BE BROUGHT TO ROOM AFTER SURGERY.  PATIENTS DISCHARGED THE DAY OF SURGERY WILL NOT BE ALLOWED TO DRIVE HOME.  PLEASE READ OVER THE FOLLOWING INSTRUCTION SHEETS _________________________________________________________________________________                                          [facility] - PREPARING FOR SURGERY  Before surgery, you can play an important role.  Because skin is not sterile, your skin needs to be as free of germs as possible.  You can reduce the number of germs on your skin by washing with CHG (chlorahexidine gluconate) soap before surgery.  CHG is an antiseptic cleaner which kills germs and bonds with the skin to continue killing germs even after washing. Please DO NOT use if you have an allergy to CHG or antibacterial soaps.  If your skin becomes reddened/irritated stop using the CHG and inform your nurse when you arrive at Short Stay. Do not shave (including legs and underarms) for at least 48 hours  prior to the first CHG shower.  You may shave your face. Please follow these instructions carefully:   1.  Shower with CHG Soap the night before surgery and the  morning of Surgery.   2.  If you choose to wash your hair, wash your hair first as usual with your  normal  Shampoo.   3.  After you shampoo, rinse your hair and body thoroughly to remove the  shampoo.                                         4.  Use CHG as you would any other liquid soap.  You can apply chg directly  to the skin and wash . Gently wash with scrungie or clean wascloth    5.  Apply the CHG Soap to your body ONLY FROM THE NECK DOWN.   Do not use on open                           Wound or open sores. Avoid contact with eyes, ears mouth and genitals (private parts).  Genitals (private parts) with your normal soap.              6.  Wash thoroughly, paying special attention to the area where your surgery  will be performed.   7.  Thoroughly rinse your body with warm water from the neck down.   8.  DO NOT shower/wash with your normal soap after using and rinsing off  the CHG Soap .                9.  Pat yourself dry with a clean towel.             10.  Wear clean night clothes to bed after shower             11.  Place clean sheets on your bed the night of your first shower and do not  sleep with pets.  Day of Surgery : Do not apply any lotions/deodorants the morning of surgery.  Please wear clean clothes to the hospital/surgery center.  FAILURE TO FOLLOW THESE INSTRUCTIONS MAY RESULT IN THE CANCELLATION OF YOUR SURGERY    PATIENT SIGNATURE_________________________________  ______________________________________________________________________

## 2015-01-03 ENCOUNTER — Encounter (HOSPITAL_COMMUNITY)
Admission: RE | Admit: 2015-01-03 | Discharge: 2015-01-03 | Disposition: A | Discharge status: Home / Self Care | Payer: Medicare Other | Source: Ambulatory Visit | Attending: Surgery | Admitting: Surgery

## 2015-01-03 ENCOUNTER — Encounter (HOSPITAL_COMMUNITY): Payer: Self-pay

## 2015-01-03 DIAGNOSIS — Z01812 Encounter for preprocedural laboratory examination: Secondary | ICD-10-CM | POA: Diagnosis not present

## 2015-01-03 DIAGNOSIS — R001 Bradycardia, unspecified: Secondary | ICD-10-CM | POA: Insufficient documentation

## 2015-01-03 DIAGNOSIS — Z0183 Encounter for blood typing: Secondary | ICD-10-CM | POA: Insufficient documentation

## 2015-01-03 DIAGNOSIS — E119 Type 2 diabetes mellitus without complications: Secondary | ICD-10-CM | POA: Diagnosis not present

## 2015-01-03 DIAGNOSIS — I10 Essential (primary) hypertension: Secondary | ICD-10-CM | POA: Insufficient documentation

## 2015-01-03 HISTORY — DX: Personal history of other diseases of the musculoskeletal system and connective tissue: Z87.39

## 2015-01-03 HISTORY — DX: Hypothyroidism, unspecified: E03.9

## 2015-01-03 HISTORY — DX: Gastro-esophageal reflux disease without esophagitis: K21.9

## 2015-01-03 HISTORY — DX: Type 2 diabetes mellitus without complications: E11.9

## 2015-01-03 HISTORY — DX: Malignant neoplasm of colon, unspecified: C18.9

## 2015-01-03 LAB — BASIC METABOLIC PANEL
ANION GAP: 8 (ref 5–15)
BUN: 15 mg/dL (ref 6–20)
CHLORIDE: 107 mmol/L (ref 101–111)
CO2: 27 mmol/L (ref 22–32)
CREATININE: 0.95 mg/dL (ref 0.44–1.00)
Calcium: 9.3 mg/dL (ref 8.9–10.3)
GFR calc Af Amer: >60 mL/min (ref >60)
GFR calc non Af Amer: 58 mL/min — ABNORMAL LOW (ref >60)
GLUCOSE: 96 mg/dL (ref 65–99)
Potassium: 4.4 mmol/L (ref 3.5–5.1)
Sodium: 142 mmol/L (ref 135–145)

## 2015-01-03 LAB — CBC
HCT: 35 % — ABNORMAL LOW (ref 36.0–46.0)
Hemoglobin: 10.4 g/dL — ABNORMAL LOW (ref 12.0–15.0)
MCH: 25.2 pg — ABNORMAL LOW (ref 26.0–34.0)
MCHC: 29.7 g/dL — AB (ref 30.0–36.0)
MCV: 84.7 fL (ref 78.0–100.0)
PLATELETS: 146 10*3/uL — AB (ref 150–400)
RBC: 4.13 MIL/uL (ref 3.87–5.11)
RDW: 15.5 % (ref 11.5–15.5)
WBC: 6.3 10*3/uL (ref 4.0–10.5)

## 2015-01-03 LAB — ABO/RH: ABO/RH(D): A POS

## 2015-01-03 NOTE — Progress Notes (Signed)
   01/03/15 1017  OBSTRUCTIVE SLEEP APNEA  Have you ever been diagnosed with sleep apnea through a sleep study? No  Do you snore loudly (loud enough to be heard through closed doors)?  1  Do you often feel tired, fatigued, or sleepy during the daytime? 1  Has anyone observed you stop breathing during your sleep? 0  Do you have, or are you being treated for high blood pressure? 1  BMI more than 35 kg/m2? 0  Age over 73 years old? 1  Neck circumference greater than 40 cm/16 inches? 0  Gender: 0

## 2015-01-05 ENCOUNTER — Encounter (HOSPITAL_COMMUNITY): Payer: Self-pay | Admitting: Surgery

## 2015-01-05 DIAGNOSIS — C189 Malignant neoplasm of colon, unspecified: Secondary | ICD-10-CM | POA: Diagnosis present

## 2015-01-05 NOTE — H&P (Signed)
General Surgery [facility]  Cheryl Jordan DOB: 02-Mar-1941 Married / Language: Undefined / Race: White Female  History of Present Illness Patient words: cecal cancer.  The patient is a 74 year old female who presents with colorectal cancer. Patient referred by Dr. Teena Irani for newly diagnosed adenocarcinoma of the colon. Patient was referred with anemia and heme positive stools. She underwent colonoscopy which showed a mass in the cecum. Biopsy was positive for invasive adenocarcinoma. CT scan of the abdomen and pelvis confirmed a tumor in the cecum. There were also changes in the liver concerning for possible cirrhosis. Patient has no history of liver disease. She has never had hepatitis. She does not consume alcohol. An MRI of the liver has been ordered but not completed at this time. Patient is referred today for evaluation for right colectomy for management of adenocarcinoma of the cecum. Previous abdominal surgery includes tubal ligation and cholecystectomy. Medical problems include hypertension and type 2 diabetes.   Other Problems Colon Cancer Diabetes Mellitus Gastroesophageal Reflux Disease High blood pressure Thyroid Disease  Past Surgical History Colon Polyp Removal - Colonoscopy Gallbladder Surgery - Laparoscopic Knee Surgery Left.  Diagnostic Studies History Colonoscopy within last year Mammogram 1-3 years ago Pap Smear >5 years ago  Allergies  Aspirin *ANALGESICS - NonNarcotic* Codeine Sulfate *ANALGESICS - OPIOID*  Medication History  LORazepam (0.5MG  Tablet, Oral) Active. AmLODIPine Besylate (5MG  Tablet, Oral) Active. Atenolol (50MG  Tablet, Oral) Active. GlipiZIDE XL (5MG  Tablet ER 24HR, Oral) Active. Synthroid (88MCG Tablet, Oral) Active. Omeprazole (40MG  Capsule DR, Oral) Active. Losartan Potassium (50MG  Tablet, Oral) Active. Iron (Ferrous Gluconate) (325MG  Tablet, Oral) Active. Medications  Reconciled  Social History  Caffeine use Carbonated beverages. No alcohol use No drug use Tobacco use Never smoker.  Family History  Diabetes Mellitus Mother. Hypertension Mother.  Pregnancy / Birth History  Age at menarche 57 years. Age of menopause 79-50 Gravida 2 Maternal age 68-25 Para 2  Review of Systems  General Present- Fatigue. Not Present- Appetite Loss, Chills, Fever, Night Sweats, Weight Gain and Weight Loss. Skin Not Present- Change in Wart/Mole, Dryness, Hives, Jaundice, New Lesions, Non-Healing Wounds, Rash and Ulcer. HEENT Present- Wears glasses/contact lenses. Not Present- Earache, Hearing Loss, Hoarseness, Nose Bleed, Oral Ulcers, Ringing in the Ears, Seasonal Allergies, Sinus Pain, Sore Throat, Visual Disturbances and Yellow Eyes. Respiratory Present- Snoring. Not Present- Bloody sputum, Chronic Cough, Difficulty Breathing and Wheezing. Cardiovascular Not Present- Chest Pain, Difficulty Breathing Lying Down, Leg Cramps, Palpitations, Rapid Heart Rate, Shortness of Breath and Swelling of Extremities. Gastrointestinal Present- Excessive gas. Not Present- Abdominal Pain, Bloating, Bloody Stool, Change in Bowel Habits, Chronic diarrhea, Constipation, Difficulty Swallowing, Gets full quickly at meals, Hemorrhoids, Indigestion, Nausea, Rectal Pain and Vomiting. Female Genitourinary Not Present- Frequency, Nocturia, Painful Urination, Pelvic Pain and Urgency. Musculoskeletal Not Present- Back Pain, Joint Pain, Joint Stiffness, Muscle Pain, Muscle Weakness and Swelling of Extremities. Neurological Not Present- Decreased Memory, Fainting, Headaches, Numbness, Seizures, Tingling, Tremor, Trouble walking and Weakness. Endocrine Present- New Diabetes. Not Present- Cold Intolerance, Excessive Hunger, Hair Changes, Heat Intolerance and Hot flashes. Hematology Not Present- Easy Bruising, Excessive bleeding, Gland problems, HIV and Persistent Infections.   Vitals   Weight: 211.8 lb Height: 68in Body Surface Area: 2.15 m Body Mass Index: 32.2 kg/m Temp.: 98.39F(Oral)  Pulse: 66 (Regular)  BP: 136/88 (Sitting, Left Arm, Standard)    Physical Exam  General - appears comfortable, no distress; not diaphorectic  HEENT - normocephalic; sclerae clear, gaze conjugate; mucous membranes moist, dentition  good; voice normal  Neck - symmetric on extension; no palpable anterior or posterior cervical adenopathy; no palpable masses in the thyroid bed  Chest - clear bilaterally without rhonchi, rales, or wheeze  Cor - regular rhythm with normal rate; no significant murmur  Abd - soft without distension; well-healed surgical incision; no palpable masses; no hepatosplenomegaly; no tenderness  Ext - non-tender without significant edema or lymphedema  Neuro - grossly intact; no tremor    Assessment & Plan  ADENOCARCINOMA OF COLON (153.9  C18.9)  Patient is referred with newly diagnosed adenocarcinoma of the colon. She is accompanied by her family. I provided them with written literature on colon surgery to review at home.  Patient has an adenocarcinoma which is biopsy proven involving the cecum. She has undergone a CT scan which suggested changes of cirrhosis in the liver and possible liver mass. An MRI of the abdomen to better evaluate the liver has been scheduled.  I have recommended proceeding with right colectomy. We discussed the procedure. We discussed the need for a bowel prep. We discussed the hospital stay to be anticipated. We discussed the potential for infection. We discussed the potential need for adjuvant chemotherapy following surgery. Patient understands and wishes to proceed with surgery in the near future.  The risks and benefits of the procedure have been discussed at length with the patient. The patient understands the proposed procedure, potential alternative treatments, and the course of recovery to be expected. All of the  patient's questions have been answered at this time. The patient wishes to proceed with surgery.  Earnstine Regal, MD, [facility] Office: [phone]

## 2015-01-06 ENCOUNTER — Inpatient Hospital Stay (HOSPITAL_COMMUNITY): Admission: RE | Admit: 2015-01-06 | Payer: Medicare Other | Source: Ambulatory Visit | Admitting: Surgery

## 2015-01-06 ENCOUNTER — Encounter (HOSPITAL_COMMUNITY): Admission: RE | Payer: Self-pay | Source: Ambulatory Visit

## 2015-01-06 LAB — TYPE AND SCREEN
ABO/RH(D): A POS
Antibody Screen: NEGATIVE

## 2015-01-06 SURGERY — COLECTOMY, PARTIAL
Anesthesia: General

## 2015-01-24 ENCOUNTER — Other Ambulatory Visit: Payer: Self-pay | Admitting: Urology

## 2015-02-04 NOTE — Patient Instructions (Addendum)
Cheryl Jordan  02/04/2015   Your procedure is scheduled on: February 10, 2015  Report to [facility] Main  Entrance take [facility]  elevators to 3rd floor to  [place] at 8:00  AM.  Call this number if you have problems the morning of surgery [phone]   Remember: ONLY 1 PERSON MAY GO WITH YOU TO SHORT STAY TO GET  READY MORNING OF [place].  Do not eat food after midnight Tuesday night.  Follow clear liquid diet until midnight Wednesday night.  Then nothing by mouth.             Bowel prep per physicians instructions.   CLEAR LIQUID DIET   Foods Allowed                                                                     Foods Excluded  Coffee and tea, regular and decaf                             liquids that you cannot  Plain Jell-O in any flavor                                             see through such as: Fruit ices (not with fruit pulp)                                     milk, soups, orange juice  Iced Popsicles                                    All solid food Carbonated beverages, regular and diet                                    Cranberry, grape and apple juices Sports drinks like Gatorade Lightly seasoned clear broth or consume(fat free) Sugar, honey syrup  Sample Menu Breakfast                                Lunch                                     Supper Cranberry juice                    Beef broth                            Chicken broth Jell-O  Grape juice                           Apple juice Coffee or tea                        Jell-O                                      Popsicle                                                Coffee or tea                        Coffee or tea  _____________________________________________________________________       Take these medicines the morning of surgery with A SIP OF WATER:  Amlodipine (Norvasc), Atenolol (Tenormin), Nexium, Levothyroxine                   You may not have any metal on your body including hair pins and              piercings  Do not wear jewelry, make-up, lotions, powders or perfumes, deodorant             Do not wear nail polish.  Do not shave  48 hours prior to surgery.          .   Do not bring valuables to the hospital. [place].  Contacts, dentures or bridgework may not be worn into surgery.  Leave suitcase in the car. After surgery it may be brought to your room.       Special Instructions: coughing and deep breathing exercises, leg exercises              Please read over the following fact sheets you were given: _____________________________________________________________________             [facility] - Preparing for Surgery Before surgery, you can play an important role.  Because skin is not sterile, your skin needs to be as free of germs as possible.  You can reduce the number of germs on your skin by washing with CHG (chlorahexidine gluconate) soap before surgery.  CHG is an antiseptic cleaner which kills germs and bonds with the skin to continue killing germs even after washing. Please DO NOT use if you have an allergy to CHG or antibacterial soaps.  If your skin becomes reddened/irritated stop using the CHG and inform your nurse when you arrive at Short Stay. Do not shave (including legs and underarms) for at least 48 hours prior to the first CHG shower.  You may shave your face/neck. Please follow these instructions carefully:  1.  Shower with CHG Soap the night before surgery and the  morning of Surgery.  2.  If you choose to wash your hair, wash your hair first as usual with your  normal  shampoo.  3.  After you shampoo, rinse your hair and body thoroughly to remove the  shampoo.  4.  Use CHG as you would any other liquid soap.  You can apply chg directly  to the skin and wash                       Gently with a  scrungie or clean washcloth.  5.  Apply the CHG Soap to your body ONLY FROM THE NECK DOWN.   Do not use on face/ open                           Wound or open sores. Avoid contact with eyes, ears mouth and genitals (private parts).                       Wash face,  Genitals (private parts) with your normal soap.             6.  Wash thoroughly, paying special attention to the area where your surgery  will be performed.  7.  Thoroughly rinse your body with warm water from the neck down.  8.  DO NOT shower/wash with your normal soap after using and rinsing off  the CHG Soap.                9.  Pat yourself dry with a clean towel.            10.  Wear clean pajamas.            11.  Place clean sheets on your bed the night of your first shower and do not  sleep with pets. Day of Surgery : Do not apply any lotions/deodorants the morning of surgery.  Please wear clean clothes to the hospital/surgery center.  FAILURE TO FOLLOW THESE INSTRUCTIONS MAY RESULT IN THE CANCELLATION OF YOUR SURGERY PATIENT SIGNATURE_________________________________  NURSE SIGNATURE__________________________________  ________________________________________________________________________

## 2015-02-04 NOTE — Progress Notes (Signed)
02-04-15 - Faxed HGA1C results on patient from 01-20-15 to Dr. Harlow Asa

## 2015-02-04 NOTE — Progress Notes (Addendum)
01-20-15 - CBC in chart 01-20-15 - CMP -in chart 01-20-15- HGA1C - in chart 01-20-15 - Lipid Panel - in chart 01-03-15 - EKG - EPIC 12-20-14 - MR ABD W Wo contrast - EPIC 11-23-14 - CT Abd/Pelvis w/contrast - EPIC

## 2015-02-07 ENCOUNTER — Encounter (HOSPITAL_COMMUNITY)
Admission: RE | Admit: 2015-02-07 | Discharge: 2015-02-07 | Disposition: A | Discharge status: Home / Self Care | Payer: Medicare Other | Source: Ambulatory Visit | Attending: Surgery | Admitting: Surgery

## 2015-02-07 ENCOUNTER — Encounter (HOSPITAL_COMMUNITY): Payer: Self-pay

## 2015-02-07 HISTORY — DX: Unspecified osteoarthritis, unspecified site: M19.90

## 2015-02-07 HISTORY — DX: Anemia, unspecified: D64.9

## 2015-02-07 NOTE — Progress Notes (Signed)
   02/07/15 1003  OBSTRUCTIVE SLEEP APNEA  Have you ever been diagnosed with sleep apnea through a sleep study? No  Do you snore loudly (loud enough to be heard through closed doors)?  1  Do you often feel tired, fatigued, or sleepy during the daytime? 1  Has anyone observed you stop breathing during your sleep? 0  Do you have, or are you being treated for high blood pressure? 1  Age over 73 years old? 1  Obstructive Sleep Apnea Score 4

## 2015-02-09 NOTE — H&P (Signed)
General Surgery [facility]  Cheryl Jordan DOB: [birth year] Married / Language: Undefined / Race: White Female  History of Present Illness Patient words: cecal cancer.  The patient is a 74 year old female who presents with colorectal cancer. Patient referred by Dr. Teena Irani for newly diagnosed adenocarcinoma of the colon. Patient was referred with anemia and heme positive stools. She underwent colonoscopy which showed a mass in the cecum. Biopsy was positive for invasive adenocarcinoma. CT scan of the abdomen and pelvis confirmed a tumor in the cecum. There were also changes in the liver concerning for possible cirrhosis. Patient has no history of liver disease. She has never had hepatitis. She does not consume alcohol. An MRI of the liver has been ordered but not completed at this time. Patient is referred today for evaluation for right colectomy for management of adenocarcinoma of the cecum. Previous abdominal surgery includes tubal ligation and cholecystectomy. Medical problems include hypertension and type 2 diabetes.   Other Problems Colon Cancer Diabetes Mellitus Gastroesophageal Reflux Disease High blood pressure Thyroid Disease  Past Surgical History Colon Polyp Removal - Colonoscopy Gallbladder Surgery - Laparoscopic Knee Surgery Left.  Diagnostic Studies History Colonoscopy within last year Mammogram 1-3 years ago Pap Smear >5 years ago  Allergies Aspirin *ANALGESICS - NonNarcotic* Codeine Sulfate *ANALGESICS - OPIOID*  Medication History LORazepam (0.5MG  Tablet, Oral) Active. AmLODIPine Besylate (5MG  Tablet, Oral) Active. Atenolol (50MG  Tablet, Oral) Active. GlipiZIDE XL (5MG  Tablet ER 24HR, Oral) Active. Synthroid (88MCG Tablet, Oral) Active. Omeprazole (40MG  Capsule DR, Oral) Active. Losartan Potassium (50MG  Tablet, Oral) Active. Iron (Ferrous Gluconate) (325MG  Tablet, Oral) Active. Medications  Reconciled  Social History Caffeine use Carbonated beverages. No alcohol use No drug use Tobacco use Never smoker.  Family History Diabetes Mellitus Mother. Hypertension Mother.  Pregnancy / Birth History Age at menarche 60 years. Age of menopause 40-50 Gravida 2 Maternal age 11-25 Para 2  Review of Systems General Present- Fatigue. Not Present- Appetite Loss, Chills, Fever, Night Sweats, Weight Gain and Weight Loss. Skin Not Present- Change in Wart/Mole, Dryness, Hives, Jaundice, New Lesions, Non-Healing Wounds, Rash and Ulcer. HEENT Present- Wears glasses/contact lenses. Not Present- Earache, Hearing Loss, Hoarseness, Nose Bleed, Oral Ulcers, Ringing in the Ears, Seasonal Allergies, Sinus Pain, Sore Throat, Visual Disturbances and Yellow Eyes. Respiratory Present- Snoring. Not Present- Bloody sputum, Chronic Cough, Difficulty Breathing and Wheezing. Cardiovascular Not Present- Chest Pain, Difficulty Breathing Lying Down, Leg Cramps, Palpitations, Rapid Heart Rate, Shortness of Breath and Swelling of Extremities. Gastrointestinal Present- Excessive gas. Not Present- Abdominal Pain, Bloating, Bloody Stool, Change in Bowel Habits, Chronic diarrhea, Constipation, Difficulty Swallowing, Gets full quickly at meals, Hemorrhoids, Indigestion, Nausea, Rectal Pain and Vomiting. Female Genitourinary Not Present- Frequency, Nocturia, Painful Urination, Pelvic Pain and Urgency. Musculoskeletal Not Present- Back Pain, Joint Pain, Joint Stiffness, Muscle Pain, Muscle Weakness and Swelling of Extremities. Neurological Not Present- Decreased Memory, Fainting, Headaches, Numbness, Seizures, Tingling, Tremor, Trouble walking and Weakness. Endocrine Present- New Diabetes. Not Present- Cold Intolerance, Excessive Hunger, Hair Changes, Heat Intolerance and Hot flashes. Hematology Not Present- Easy Bruising, Excessive bleeding, Gland problems, HIV and Persistent Infections.   Vitals Weight:  211.8 lb Height: 68in Body Surface Area: 2.15 m Body Mass Index: 32.2 kg/m Temp.: 98.61F(Oral)  Pulse: 66 (Regular)  BP: 136/88 (Sitting, Left Arm, Standard)   Physical Exam  General - appears comfortable, no distress; not diaphorectic  HEENT - normocephalic; sclerae clear, gaze conjugate; mucous membranes moist, dentition good; voice normal  Neck - symmetric on  extension; no palpable anterior or posterior cervical adenopathy; no palpable masses in the thyroid bed  Chest - clear bilaterally without rhonchi, rales, or wheeze  Cor - regular rhythm with normal rate; no significant murmur  Abd - soft without distension; well-healed surgical incision; no palpable masses; no hepatosplenomegaly; no tenderness  Ext - non-tender without significant edema or lymphedema  Neuro - grossly intact; no tremor    Assessment & Plan  ADENOCARCINOMA OF COLON (153.9  C18.9)  Patient is referred with newly diagnosed adenocarcinoma of the colon. She is accompanied by her family. I provided them with written literature on colon surgery to review at home.  Patient has an adenocarcinoma which is biopsy proven involving the cecum. She has undergone a CT scan which suggested changes of cirrhosis in the liver and possible liver mass. An MRI of the abdomen to better evaluate the liver has been scheduled.  I have recommended proceeding with right colectomy. We discussed the procedure. We discussed the need for a bowel prep. We discussed the hospital stay to be anticipated. We discussed the potential for infection. We discussed the potential need for adjuvant chemotherapy following surgery. Patient understands and wishes to proceed with surgery in the near future.  The risks and benefits of the procedure have been discussed at length with the patient. The patient understands the proposed procedure, potential alternative treatments, and the course of recovery to be expected. All of the patient's  questions have been answered at this time. The patient wishes to proceed with surgery.  Earnstine Regal, MD, [facility] Office: [phone]

## 2015-02-10 ENCOUNTER — Inpatient Hospital Stay (HOSPITAL_COMMUNITY): Payer: Medicare Other | Admitting: Certified Registered Nurse Anesthetist

## 2015-02-10 ENCOUNTER — Inpatient Hospital Stay (HOSPITAL_COMMUNITY)
Admission: RE | Admit: 2015-02-10 | Discharge: 2015-02-16 | DRG: 330 | Disposition: A | Discharge status: Home Health | Payer: Medicare Other | Source: Ambulatory Visit | Attending: Surgery | Admitting: Surgery

## 2015-02-10 ENCOUNTER — Encounter (HOSPITAL_COMMUNITY): Payer: Self-pay | Admitting: *Deleted

## 2015-02-10 ENCOUNTER — Encounter (HOSPITAL_COMMUNITY)
Admission: RE | Disposition: A | Discharge status: Home Health | Payer: Self-pay | Source: Ambulatory Visit | Attending: Surgery

## 2015-02-10 DIAGNOSIS — I10 Essential (primary) hypertension: Secondary | ICD-10-CM | POA: Diagnosis present

## 2015-02-10 DIAGNOSIS — K219 Gastro-esophageal reflux disease without esophagitis: Secondary | ICD-10-CM | POA: Diagnosis present

## 2015-02-10 DIAGNOSIS — Z86010 Personal history of colonic polyps: Secondary | ICD-10-CM | POA: Diagnosis not present

## 2015-02-10 DIAGNOSIS — E119 Type 2 diabetes mellitus without complications: Secondary | ICD-10-CM | POA: Diagnosis not present

## 2015-02-10 DIAGNOSIS — D649 Anemia, unspecified: Secondary | ICD-10-CM | POA: Diagnosis present

## 2015-02-10 DIAGNOSIS — Z833 Family history of diabetes mellitus: Secondary | ICD-10-CM

## 2015-02-10 DIAGNOSIS — Z79899 Other long term (current) drug therapy: Secondary | ICD-10-CM

## 2015-02-10 DIAGNOSIS — E079 Disorder of thyroid, unspecified: Secondary | ICD-10-CM | POA: Diagnosis present

## 2015-02-10 DIAGNOSIS — Z886 Allergy status to analgesic agent status: Secondary | ICD-10-CM | POA: Diagnosis not present

## 2015-02-10 DIAGNOSIS — C775 Secondary and unspecified malignant neoplasm of intrapelvic lymph nodes: Secondary | ICD-10-CM | POA: Diagnosis not present

## 2015-02-10 DIAGNOSIS — N281 Cyst of kidney, acquired: Secondary | ICD-10-CM | POA: Diagnosis not present

## 2015-02-10 DIAGNOSIS — Z885 Allergy status to narcotic agent status: Secondary | ICD-10-CM | POA: Diagnosis not present

## 2015-02-10 DIAGNOSIS — R110 Nausea: Secondary | ICD-10-CM | POA: Diagnosis not present

## 2015-02-10 DIAGNOSIS — R197 Diarrhea, unspecified: Secondary | ICD-10-CM | POA: Diagnosis not present

## 2015-02-10 DIAGNOSIS — Z8249 Family history of ischemic heart disease and other diseases of the circulatory system: Secondary | ICD-10-CM

## 2015-02-10 DIAGNOSIS — C180 Malignant neoplasm of cecum: Secondary | ICD-10-CM | POA: Diagnosis not present

## 2015-02-10 DIAGNOSIS — C189 Malignant neoplasm of colon, unspecified: Secondary | ICD-10-CM | POA: Diagnosis present

## 2015-02-10 HISTORY — PX: NEPHRECTOMY: SHX65

## 2015-02-10 HISTORY — PX: PARTIAL COLECTOMY: SHX5273

## 2015-02-10 LAB — TYPE AND SCREEN
ABO/RH(D): A POS
ANTIBODY SCREEN: NEGATIVE

## 2015-02-10 LAB — CBC
HCT: 34.3 % — ABNORMAL LOW (ref 36.0–46.0)
HEMOGLOBIN: 10.6 g/dL — AB (ref 12.0–15.0)
MCH: 26.2 pg (ref 26.0–34.0)
MCHC: 30.9 g/dL (ref 30.0–36.0)
MCV: 84.7 fL (ref 78.0–100.0)
Platelets: 228 10*3/uL (ref 150–400)
RBC: 4.05 MIL/uL (ref 3.87–5.11)
RDW: 15.2 % (ref 11.5–15.5)
WBC: 18.5 10*3/uL — ABNORMAL HIGH (ref 4.0–10.5)

## 2015-02-10 LAB — GLUCOSE, CAPILLARY
GLUCOSE-CAPILLARY: 186 mg/dL — AB (ref 65–99)
GLUCOSE-CAPILLARY: 208 mg/dL — AB (ref 65–99)
GLUCOSE-CAPILLARY: 227 mg/dL — AB (ref 65–99)
Glucose-Capillary: 94 mg/dL (ref 65–99)
Glucose-Capillary: 158 mg/dL — ABNORMAL HIGH (ref 65–99)

## 2015-02-10 LAB — CREATININE, SERUM
CREATININE: 1.23 mg/dL — AB (ref 0.44–1.00)
GFR, EST AFRICAN AMERICAN: 49 mL/min — AB (ref >60)
GFR, EST NON AFRICAN AMERICAN: 42 mL/min — AB (ref >60)

## 2015-02-10 SURGERY — COLECTOMY, PARTIAL
Anesthesia: General

## 2015-02-10 MED ORDER — METOPROLOL TARTRATE 1 MG/ML IV SOLN
5.0000 mg | Freq: Four times a day (QID) | INTRAVENOUS | Status: DC | PRN
  Administered 2015-02-14: 5 mg via INTRAVENOUS
  Filled 2015-02-10 (×2): qty 5

## 2015-02-10 MED ORDER — NALOXONE HCL 0.4 MG/ML IJ SOLN: 0.4000 mg | INTRAMUSCULAR | Status: DC | PRN

## 2015-02-10 MED ORDER — SODIUM CHLORIDE 0.9 % IJ SOLN: 9.0000 mL | INTRAMUSCULAR | Status: DC | PRN

## 2015-02-10 MED ORDER — ALVIMOPAN 12 MG PO CAPS
12.0000 mg | ORAL_CAPSULE | Freq: Two times a day (BID) | ORAL | Status: DC
  Administered 2015-02-11 – 2015-02-13 (×5): 12 mg via ORAL
  Filled 2015-02-10 (×6): qty 1

## 2015-02-10 MED ORDER — PROPOFOL INFUSION 10 MG/ML OPTIME
INTRAVENOUS | Status: DC | PRN
  Administered 2015-02-10: 25 ug/kg/min via INTRAVENOUS

## 2015-02-10 MED ORDER — DIPHENHYDRAMINE HCL 12.5 MG/5ML PO ELIX: 12.5000 mg | ORAL_SOLUTION | Freq: Four times a day (QID) | ORAL | Status: DC | PRN

## 2015-02-10 MED ORDER — PROPOFOL 10 MG/ML IV BOLUS
INTRAVENOUS | Status: DC | PRN
  Administered 2015-02-10: 120 mg via INTRAVENOUS

## 2015-02-10 MED ORDER — ONDANSETRON HCL 4 MG/2ML IJ SOLN
INTRAMUSCULAR | Status: DC | PRN
  Administered 2015-02-10: 4 mg via INTRAVENOUS

## 2015-02-10 MED ORDER — LIDOCAINE HCL (CARDIAC) 20 MG/ML IV SOLN
INTRAVENOUS | Status: AC
  Filled 2015-02-10: qty 5

## 2015-02-10 MED ORDER — LEVOTHYROXINE SODIUM 88 MCG PO TABS
88.0000 ug | ORAL_TABLET | Freq: Every day | ORAL | Status: DC
  Administered 2015-02-11 – 2015-02-16 (×6): 88 ug via ORAL
  Filled 2015-02-10 (×8): qty 1

## 2015-02-10 MED ORDER — LACTATED RINGERS IV SOLN
INTRAVENOUS | Status: DC | PRN
  Administered 2015-02-10: 11:00:00 via INTRAVENOUS

## 2015-02-10 MED ORDER — SODIUM CHLORIDE 0.9 % IV SOLN
1.0000 g | INTRAVENOUS | Status: AC
  Administered 2015-02-10: 1 g via INTRAVENOUS
  Filled 2015-02-10: qty 1

## 2015-02-10 MED ORDER — ONDANSETRON HCL 4 MG/2ML IJ SOLN: 4.0000 mg | Freq: Once | INTRAMUSCULAR | Status: DC | PRN

## 2015-02-10 MED ORDER — MORPHINE SULFATE 1 MG/ML IV SOLN
INTRAVENOUS | Status: AC
  Filled 2015-02-10: qty 25

## 2015-02-10 MED ORDER — METOPROLOL TARTRATE 12.5 MG HALF TABLET
12.5000 mg | ORAL_TABLET | Freq: Two times a day (BID) | ORAL | Status: DC
  Administered 2015-02-10 – 2015-02-16 (×12): 12.5 mg via ORAL
  Filled 2015-02-10 (×13): qty 1

## 2015-02-10 MED ORDER — SUGAMMADEX SODIUM 200 MG/2ML IV SOLN
INTRAVENOUS | Status: DC | PRN
  Administered 2015-02-10: 200 mg via INTRAVENOUS

## 2015-02-10 MED ORDER — MENTHOL 3 MG MT LOZG
1.0000 | LOZENGE | OROMUCOSAL | Status: DC | PRN
  Filled 2015-02-10: qty 9

## 2015-02-10 MED ORDER — MIDAZOLAM HCL 2 MG/2ML IJ SOLN
INTRAMUSCULAR | Status: AC
  Filled 2015-02-10: qty 2

## 2015-02-10 MED ORDER — ROCURONIUM BROMIDE 100 MG/10ML IV SOLN
INTRAVENOUS | Status: AC
  Filled 2015-02-10: qty 1

## 2015-02-10 MED ORDER — DIPHENHYDRAMINE HCL 50 MG/ML IJ SOLN: 12.5000 mg | Freq: Four times a day (QID) | INTRAMUSCULAR | Status: DC | PRN

## 2015-02-10 MED ORDER — MORPHINE SULFATE 1 MG/ML IV SOLN
INTRAVENOUS | Status: DC
  Administered 2015-02-10: 16.5 mg via INTRAVENOUS
  Administered 2015-02-10: 21:00:00 via INTRAVENOUS
  Administered 2015-02-10: 1.5 mg via INTRAVENOUS
  Filled 2015-02-10: qty 25

## 2015-02-10 MED ORDER — HYDROMORPHONE HCL 1 MG/ML IJ SOLN
0.2500 mg | INTRAMUSCULAR | Status: DC | PRN
  Administered 2015-02-10: 0.25 mg via INTRAVENOUS
  Administered 2015-02-10: 0.5 mg via INTRAVENOUS
  Administered 2015-02-10: 0.25 mg via INTRAVENOUS

## 2015-02-10 MED ORDER — LACTATED RINGERS IV SOLN
INTRAVENOUS | Status: DC | PRN
  Administered 2015-02-10 (×3): via INTRAVENOUS

## 2015-02-10 MED ORDER — FENTANYL CITRATE (PF) 250 MCG/5ML IJ SOLN
INTRAMUSCULAR | Status: AC
  Filled 2015-02-10: qty 5

## 2015-02-10 MED ORDER — ALUM & MAG HYDROXIDE-SIMETH 200-200-20 MG/5ML PO SUSP: 30.0000 mL | Freq: Four times a day (QID) | ORAL | Status: DC | PRN

## 2015-02-10 MED ORDER — PHENOL 1.4 % MT LIQD
2.0000 | OROMUCOSAL | Status: DC | PRN
  Filled 2015-02-10: qty 177

## 2015-02-10 MED ORDER — INSULIN ASPART 100 UNIT/ML ~~LOC~~ SOLN
0.0000 [IU] | SUBCUTANEOUS | Status: DC
  Administered 2015-02-10 (×2): 5 [IU] via SUBCUTANEOUS
  Administered 2015-02-11: 3 [IU] via SUBCUTANEOUS
  Administered 2015-02-11: 2 [IU] via SUBCUTANEOUS
  Administered 2015-02-11 – 2015-02-12 (×5): 3 [IU] via SUBCUTANEOUS
  Administered 2015-02-12: 2 [IU] via SUBCUTANEOUS
  Administered 2015-02-12 (×2): 3 [IU] via SUBCUTANEOUS
  Administered 2015-02-12: 2 [IU] via SUBCUTANEOUS
  Administered 2015-02-12 – 2015-02-13 (×2): 3 [IU] via SUBCUTANEOUS
  Administered 2015-02-13 – 2015-02-14 (×6): 2 [IU] via SUBCUTANEOUS
  Administered 2015-02-14 (×2): 3 [IU] via SUBCUTANEOUS

## 2015-02-10 MED ORDER — PROPOFOL 10 MG/ML IV BOLUS
INTRAVENOUS | Status: AC
  Filled 2015-02-10: qty 20

## 2015-02-10 MED ORDER — MANNITOL 25 % IV SOLN
25.0000 g | Freq: Once | INTRAVENOUS | Status: DC
  Filled 2015-02-10: qty 100

## 2015-02-10 MED ORDER — ACETAMINOPHEN 500 MG PO TABS
1000.0000 mg | ORAL_TABLET | Freq: Three times a day (TID) | ORAL | Status: DC
  Administered 2015-02-10 – 2015-02-13 (×10): 1000 mg via ORAL
  Filled 2015-02-10 (×14): qty 2

## 2015-02-10 MED ORDER — ENOXAPARIN SODIUM 40 MG/0.4ML ~~LOC~~ SOLN
40.0000 mg | SUBCUTANEOUS | Status: DC
  Administered 2015-02-11 – 2015-02-15 (×5): 40 mg via SUBCUTANEOUS
  Filled 2015-02-10 (×6): qty 0.4

## 2015-02-10 MED ORDER — LIDOCAINE HCL (CARDIAC) 20 MG/ML IV SOLN
INTRAVENOUS | Status: DC | PRN
  Administered 2015-02-10: 30 mg via INTRAVENOUS

## 2015-02-10 MED ORDER — PANTOPRAZOLE SODIUM 40 MG PO TBEC
40.0000 mg | DELAYED_RELEASE_TABLET | Freq: Two times a day (BID) | ORAL | Status: DC
  Administered 2015-02-11 – 2015-02-16 (×11): 40 mg via ORAL
  Filled 2015-02-10 (×15): qty 1

## 2015-02-10 MED ORDER — LIP MEDEX EX OINT
1.0000 "application " | TOPICAL_OINTMENT | Freq: Two times a day (BID) | CUTANEOUS | Status: DC
  Administered 2015-02-10 – 2015-02-16 (×12): 1 via TOPICAL

## 2015-02-10 MED ORDER — MAGIC MOUTHWASH
15.0000 mL | Freq: Four times a day (QID) | ORAL | Status: DC | PRN
  Filled 2015-02-10: qty 15

## 2015-02-10 MED ORDER — HYDROMORPHONE HCL 1 MG/ML IJ SOLN
INTRAMUSCULAR | Status: AC
  Filled 2015-02-10: qty 1

## 2015-02-10 MED ORDER — SUGAMMADEX SODIUM 200 MG/2ML IV SOLN
INTRAVENOUS | Status: AC
  Filled 2015-02-10: qty 2

## 2015-02-10 MED ORDER — ONDANSETRON HCL 4 MG/2ML IJ SOLN
4.0000 mg | Freq: Four times a day (QID) | INTRAMUSCULAR | Status: DC | PRN
  Administered 2015-02-10: 4 mg via INTRAVENOUS
  Filled 2015-02-10: qty 2

## 2015-02-10 MED ORDER — ALVIMOPAN 12 MG PO CAPS
12.0000 mg | ORAL_CAPSULE | Freq: Once | ORAL | Status: AC
  Administered 2015-02-10: 12 mg via ORAL
  Filled 2015-02-10: qty 1

## 2015-02-10 MED ORDER — METOPROLOL TARTRATE 12.5 MG HALF TABLET
12.5000 mg | ORAL_TABLET | Freq: Two times a day (BID) | ORAL | Status: DC | PRN
  Filled 2015-02-10: qty 1

## 2015-02-10 MED ORDER — METHOCARBAMOL 1000 MG/10ML IJ SOLN
1000.0000 mg | Freq: Four times a day (QID) | INTRAVENOUS | Status: DC | PRN
  Filled 2015-02-10: qty 10

## 2015-02-10 MED ORDER — LEVOTHYROXINE SODIUM 88 MCG PO TABS
88.0000 ug | ORAL_TABLET | Freq: Every day | ORAL | Status: DC
Start: 2015-02-10 — End: 2015-02-10

## 2015-02-10 MED ORDER — AMLODIPINE BESYLATE 5 MG PO TABS
5.0000 mg | ORAL_TABLET | Freq: Every morning | ORAL | Status: DC
  Administered 2015-02-11 – 2015-02-16 (×6): 5 mg via ORAL
  Filled 2015-02-10 (×6): qty 1

## 2015-02-10 MED ORDER — MORPHINE SULFATE 2 MG/ML IJ SOLN: 2.0000 mg | INTRAMUSCULAR | Status: DC | PRN

## 2015-02-10 MED ORDER — LACTATED RINGERS IV BOLUS (SEPSIS)
1000.0000 mL | Freq: Three times a day (TID) | INTRAVENOUS | Status: DC | PRN
Start: 2015-02-10 — End: 2015-02-11

## 2015-02-10 MED ORDER — HYDROMORPHONE 0.3 MG/ML IV SOLN
INTRAVENOUS | Status: DC
  Administered 2015-02-10: 22:00:00 via INTRAVENOUS
  Administered 2015-02-11: 3.6 mg via INTRAVENOUS
  Administered 2015-02-11: 0.9 mg via INTRAVENOUS
  Administered 2015-02-11: 16:00:00 via INTRAVENOUS
  Administered 2015-02-11: 1.2 mg via INTRAVENOUS
  Administered 2015-02-11: 0.9 mg via INTRAVENOUS
  Administered 2015-02-11: 1.5 mg via INTRAVENOUS
  Administered 2015-02-12 (×2): 0.6 mg via INTRAVENOUS
  Administered 2015-02-12: 1.2 mg via INTRAVENOUS
  Administered 2015-02-12: 0.3 mg via INTRAVENOUS
  Administered 2015-02-12: 0.6 mg via INTRAVENOUS
  Administered 2015-02-12 – 2015-02-13 (×2): 0.3 mg via INTRAVENOUS
  Administered 2015-02-13 (×3): 0.6 mg via INTRAVENOUS
  Administered 2015-02-13 – 2015-02-14 (×4): 0.3 mg via INTRAVENOUS
  Filled 2015-02-10 (×3): qty 25

## 2015-02-10 MED ORDER — HYDROMORPHONE HCL 1 MG/ML IJ SOLN: 0.5000 mg | INTRAMUSCULAR | Status: DC | PRN

## 2015-02-10 MED ORDER — MEPERIDINE HCL 50 MG/ML IJ SOLN
6.2500 mg | INTRAMUSCULAR | Status: DC | PRN
Start: 2015-02-10 — End: 2015-02-10

## 2015-02-10 MED ORDER — MIDAZOLAM HCL 5 MG/5ML IJ SOLN
INTRAMUSCULAR | Status: DC | PRN
  Administered 2015-02-10: 1 mg via INTRAVENOUS

## 2015-02-10 MED ORDER — FENTANYL CITRATE (PF) 250 MCG/5ML IJ SOLN
INTRAMUSCULAR | Status: DC | PRN
  Administered 2015-02-10: 150 ug via INTRAVENOUS
  Administered 2015-02-10 (×3): 100 ug via INTRAVENOUS

## 2015-02-10 MED ORDER — KCL IN DEXTROSE-NACL 20-5-0.45 MEQ/L-%-% IV SOLN
INTRAVENOUS | Status: DC
  Administered 2015-02-10 – 2015-02-11 (×3): via INTRAVENOUS
  Administered 2015-02-11: 1000 mL via INTRAVENOUS
  Administered 2015-02-12: 17:00:00 via INTRAVENOUS
  Administered 2015-02-12: 1000 mL via INTRAVENOUS
  Administered 2015-02-13 – 2015-02-15 (×5): via INTRAVENOUS
  Filled 2015-02-10 (×14): qty 1000

## 2015-02-10 MED ORDER — KCL IN DEXTROSE-NACL 20-5-0.45 MEQ/L-%-% IV SOLN
INTRAVENOUS | Status: AC
  Filled 2015-02-10: qty 1000

## 2015-02-10 MED ORDER — CHLORHEXIDINE GLUCONATE 4 % EX LIQD: Freq: Once | CUTANEOUS | Status: DC

## 2015-02-10 MED ORDER — ROCURONIUM BROMIDE 100 MG/10ML IV SOLN
INTRAVENOUS | Status: DC | PRN
  Administered 2015-02-10: 20 mg via INTRAVENOUS
  Administered 2015-02-10: 50 mg via INTRAVENOUS

## 2015-02-10 MED ORDER — ATENOLOL 50 MG PO TABS: 50.0000 mg | ORAL_TABLET | Freq: Every morning | ORAL | Status: DC

## 2015-02-10 MED ORDER — LOSARTAN POTASSIUM 25 MG PO TABS
25.0000 mg | ORAL_TABLET | Freq: Every morning | ORAL | Status: DC
  Administered 2015-02-11 – 2015-02-16 (×6): 25 mg via ORAL
  Filled 2015-02-10 (×6): qty 1

## 2015-02-10 MED ORDER — LIP MEDEX EX OINT
TOPICAL_OINTMENT | CUTANEOUS | Status: AC
  Filled 2015-02-10: qty 7

## 2015-02-10 MED ORDER — ONDANSETRON HCL 4 MG/2ML IJ SOLN
4.0000 mg | Freq: Four times a day (QID) | INTRAMUSCULAR | Status: DC | PRN
  Administered 2015-02-11 – 2015-02-13 (×4): 4 mg via INTRAVENOUS
  Filled 2015-02-10 (×4): qty 2

## 2015-02-10 SURGICAL SUPPLY — 98 items
APPLIER CLIP ROT 10 11.4 M/L (STAPLE) ×4
APR CLP MED LRG 11.4X10 (STAPLE) ×2
ATTRACTOMAT 16X20 MAGNETIC DRP (DRAPES) ×4 IMPLANT
BAG ISL DRAPE 18X18 STRL (DRAPES) ×2
BAG ISOLATION DRAPE 18X18 (DRAPES) ×2 IMPLANT
BLADE EXTENDED COATED 6.5IN (ELECTRODE) ×4 IMPLANT
BLADE HEX COATED 2.75 (ELECTRODE) ×8 IMPLANT
BLADE SURG SZ10 CARB STEEL (BLADE) IMPLANT
BLADE SURG SZ20 CARB STEEL (BLADE) ×4 IMPLANT
CHLORAPREP W/TINT 26ML (MISCELLANEOUS) ×8 IMPLANT
CLIP APPLIE ROT 10 11.4 M/L (STAPLE) IMPLANT
CLIP LIGATING HEM O LOK PURPLE (MISCELLANEOUS) ×16 IMPLANT
CLIP LIGATING HEMO O LOK GREEN (MISCELLANEOUS) ×16 IMPLANT
CLIP SUT LAPRA TY ABSORB (SUTURE) ×8 IMPLANT
CLIP TI LARGE 6 (CLIP) ×2 IMPLANT
COVER MAYO STAND STRL (DRAPES) ×8 IMPLANT
COVER SURGICAL LIGHT HANDLE (MISCELLANEOUS) ×8 IMPLANT
DECANTER SPIKE VIAL GLASS SM (MISCELLANEOUS) IMPLANT
DISSECTOR ROUND CHERRY 3/8 STR (MISCELLANEOUS) ×4 IMPLANT
DRAIN CHANNEL 15F RND FF 3/16 (WOUND CARE) ×4 IMPLANT
DRAIN PENROSE 18X1/2 LTX STRL (DRAIN) IMPLANT
DRAPE INCISE IOBAN 66X45 STRL (DRAPES) ×4 IMPLANT
DRAPE ISOLATION BAG 18X18 (DRAPES) ×2
DRAPE LAPAROSCOPIC ABDOMINAL (DRAPES) ×4 IMPLANT
DRAPE LAPAROTOMY TRNSV 102X78 (DRAPE) IMPLANT
DRAPE SHEET LG 3/4 BI-LAMINATE (DRAPES) ×4 IMPLANT
DRAPE SLUSH/WARMER DISC (DRAPES) ×4 IMPLANT
DRAPE TABLE BACK 44X90 PK DISP (DRAPES) ×4 IMPLANT
DRAPE UTILITY XL STRL (DRAPES) ×8 IMPLANT
DRAPE WARM FLUID 44X44 (DRAPE) ×8 IMPLANT
DRSG OPSITE POSTOP 4X10 (GAUZE/BANDAGES/DRESSINGS) IMPLANT
DRSG OPSITE POSTOP 4X12 (GAUZE/BANDAGES/DRESSINGS) ×2 IMPLANT
DRSG OPSITE POSTOP 4X6 (GAUZE/BANDAGES/DRESSINGS) IMPLANT
DRSG OPSITE POSTOP 4X8 (GAUZE/BANDAGES/DRESSINGS) IMPLANT
DRSG PAD ABDOMINAL 8X10 ST (GAUZE/BANDAGES/DRESSINGS) IMPLANT
ELECT PENCIL ROCKER SW 15FT (MISCELLANEOUS) ×4 IMPLANT
ELECT REM PT RETURN 9FT ADLT (ELECTROSURGICAL) ×12
ELECTRODE REM PT RTRN 9FT ADLT (ELECTROSURGICAL) ×6 IMPLANT
EVACUATOR DRAINAGE 7X20 100CC (MISCELLANEOUS) ×2 IMPLANT
EVACUATOR SILICONE 100CC (DRAIN) IMPLANT
EVACUATOR SILICONE 100CC (MISCELLANEOUS) ×4
FLOSEAL 10ML (HEMOSTASIS) ×4 IMPLANT
GAUZE SPONGE 4X4 12PLY STRL (GAUZE/BANDAGES/DRESSINGS) ×8 IMPLANT
GAUZE SPONGE 4X4 16PLY XRAY LF (GAUZE/BANDAGES/DRESSINGS) ×4 IMPLANT
GLOVE BIOGEL M STRL SZ7.5 (GLOVE) ×4 IMPLANT
GLOVE SURG ORTHO 8.0 STRL STRW (GLOVE) ×8 IMPLANT
GOWN STRL REUS W/TWL LRG LVL3 (GOWN DISPOSABLE) ×12 IMPLANT
GOWN STRL REUS W/TWL XL LVL3 (GOWN DISPOSABLE) ×16 IMPLANT
HEMOSTAT SURGICEL 4X8 (HEMOSTASIS) ×8 IMPLANT
KIT BASIN OR (CUSTOM PROCEDURE TRAY) ×8 IMPLANT
LEGGING LITHOTOMY PAIR STRL (DRAPES) ×4 IMPLANT
LIGASURE IMPACT 36 18CM CVD LR (INSTRUMENTS) IMPLANT
LOOP VESSEL MAXI BLUE (MISCELLANEOUS) ×4 IMPLANT
LUBRICANT JELLY K Y 4OZ (MISCELLANEOUS) IMPLANT
NS IRRIG 1000ML POUR BTL (IV SOLUTION) ×14 IMPLANT
PACK GENERAL/GYN (CUSTOM PROCEDURE TRAY) ×8 IMPLANT
POSITIONER SURGICAL ARM (MISCELLANEOUS) ×8 IMPLANT
RELOAD PROXIMATE 75MM BLUE (ENDOMECHANICALS) ×8 IMPLANT
RELOAD STAPLE 75 3.8 BLU REG (ENDOMECHANICALS) IMPLANT
SPONGE LAP 18X18 X RAY DECT (DISPOSABLE) ×28 IMPLANT
SPONGE SURGIFOAM ABS GEL 100 (HEMOSTASIS) IMPLANT
STAPLER GUN LINEAR PROX 60 (STAPLE) ×2 IMPLANT
STAPLER PROXIMATE 75MM BLUE (STAPLE) ×2 IMPLANT
STAPLER VISISTAT 35W (STAPLE) ×8 IMPLANT
SUCTION POOLE TIP (SUCTIONS) ×4 IMPLANT
SUT ETHILON 3 0 PS 1 (SUTURE) IMPLANT
SUT MON AB 2-0 SH 27 (SUTURE) ×8 IMPLANT
SUT MON AB 2-0 SH27 (SUTURE) ×2 IMPLANT
SUT NOV 1 T60/GS (SUTURE) ×2 IMPLANT
SUT NOVA NAB DX-16 0-1 5-0 T12 (SUTURE) IMPLANT
SUT NOVA T20/GS 25 (SUTURE) ×8 IMPLANT
SUT PDS AB 1 TP1 96 (SUTURE) ×8 IMPLANT
SUT SILK 0 (SUTURE) ×4
SUT SILK 0 30XBRD TIE 6 (SUTURE) ×2 IMPLANT
SUT SILK 2 0 (SUTURE) ×12
SUT SILK 2 0 SH CR/8 (SUTURE) ×4 IMPLANT
SUT SILK 2 0SH CR/8 30 (SUTURE) IMPLANT
SUT SILK 2-0 18XBRD TIE 12 (SUTURE) ×2 IMPLANT
SUT SILK 2-0 30XBRD TIE 12 (SUTURE) ×4 IMPLANT
SUT SILK 3 0 (SUTURE) ×4
SUT SILK 3 0 SH CR/8 (SUTURE) ×4 IMPLANT
SUT SILK 3-0 18XBRD TIE 12 (SUTURE) ×2 IMPLANT
SUT VIC AB 2-0 SH 27 (SUTURE) ×16
SUT VIC AB 2-0 SH 27X BRD (SUTURE) ×8 IMPLANT
SUT VIC AB 3-0 SH 18 (SUTURE) ×2 IMPLANT
SUT VIC AB 3-0 SH 27 (SUTURE) ×12
SUT VIC AB 3-0 SH 27X BRD (SUTURE) ×2 IMPLANT
SUT VIC AB 4-0 RB1 27 (SUTURE) ×8
SUT VIC AB 4-0 RB1 27XBRD (SUTURE) ×4 IMPLANT
SYRINGE 10CC LL (SYRINGE) ×4 IMPLANT
TAPE CLOTH SURG 4X10 WHT LF (GAUZE/BANDAGES/DRESSINGS) ×4 IMPLANT
TOWEL OR 17X26 10 PK STRL BLUE (TOWEL DISPOSABLE) ×16 IMPLANT
TOWEL OR NON WOVEN STRL DISP B (DISPOSABLE) ×12 IMPLANT
TRAY FOLEY W/METER SILVER 14FR (SET/KITS/TRAYS/PACK) ×8 IMPLANT
TUBING CONNECTING 10 (TUBING) ×3 IMPLANT
TUBING CONNECTING 10' (TUBING) ×1
WATER STERILE IRR 1500ML POUR (IV SOLUTION) ×4 IMPLANT
YANKAUER SUCT BULB TIP 10FT TU (MISCELLANEOUS) ×4 IMPLANT

## 2015-02-10 NOTE — Progress Notes (Signed)
Actual pre op Assessment time [id number]

## 2015-02-10 NOTE — Brief Op Note (Signed)
02/10/2015  2:36 PM  PATIENT:  Cheryl Jordan  74 y.o. female  PRE-OPERATIVE DIAGNOSIS:  COLON CANCER/Left Renal Mass  POST-OPERATIVE DIAGNOSIS:  COLON CANCER/Left Renal Mass  PROCEDURE:  Procedure(s): OPEN RIGHT  COLECTOMY RETROPERINTONEAL EXPLORATION LEFT RENAL  CYST CORTICATION X 5 (N/A) LEFT OPEN PARTIAL NEPHRECTOMY (Left)  SURGEON:  Surgeon(s) and Role: Panel 1:    * [name], MD - Primary    * Mickeal Skinner, MD - Assisting  Panel 2:    * Cleon Gustin, MD - Primary  ANESTHESIA:   general  EBL:  Total I/O In: 2000 [I.V.:2000] Out: 700 [Urine:500; Blood:200]  BLOOD ADMINISTERED:none  DRAINS: none   LOCAL MEDICATIONS USED:  NONE  SPECIMEN:  Excision  DISPOSITION OF SPECIMEN:  PATHOLOGY  COUNTS:  YES  TOURNIQUET:  * No tourniquets in log *  DICTATION: .Other Dictation: Dictation Number [id number]  PLAN OF CARE: Admit to inpatient   PATIENT DISPOSITION:  PACU - hemodynamically stable.   Delay start of Pharmacological VTE agent (>24hrs) due to surgical blood loss or risk of bleeding: yes  Earnstine Regal, MD, [facility] Office: [phone]

## 2015-02-10 NOTE — Anesthesia Preprocedure Evaluation (Signed)
Anesthesia Evaluation  Patient identified by MRN, date of birth, ID band Patient awake    Reviewed: Allergy & Precautions, NPO status , Patient's Chart, lab work & pertinent test results  History of Anesthesia Complications (+) PONV  Airway Mallampati: I  TM Distance: >3 FB Neck ROM: Full    Dental   Pulmonary    Pulmonary exam normal       Cardiovascular hypertension, Pt. on medications Normal cardiovascular exam    Neuro/Psych    GI/Hepatic GERD-  Medicated and Controlled,  Endo/Other  diabetes  Renal/GU      Musculoskeletal   Abdominal   Peds  Hematology   Anesthesia Other Findings   Reproductive/Obstetrics                             Anesthesia Physical Anesthesia Plan  ASA: II  Anesthesia Plan: General   Post-op Pain Management:    Induction: Intravenous  Airway Management Planned: Oral ETT  Additional Equipment:   Intra-op Plan:   Post-operative Plan: Extubation in OR  Informed Consent: I have reviewed the patients History and Physical, chart, labs and discussed the procedure including the risks, benefits and alternatives for the proposed anesthesia with the patient or authorized representative who has indicated his/her understanding and acceptance.     Plan Discussed with: CRNA and Surgeon  Anesthesia Plan Comments:         Anesthesia Quick Evaluation

## 2015-02-10 NOTE — Interval H&P Note (Signed)
History and Physical Interval Note:  02/10/2015 9:57 AM  Cheryl Jordan  has presented today for surgery, with the diagnosis of COLON CANCER/Left Renal Mass.  The various methods of treatment have been discussed with the patient and family. After consideration of risks, benefits and other options for treatment, the patient has consented to    Procedure(s): OPEN RIGHT  COLECTOMY (N/A) LEFT OPEN PARTIAL NEPHRECTOMY (Left) as a surgical intervention .    The patient's history has been reviewed, patient examined, no change in status, stable for surgery.  I have reviewed the patient's chart and labs.  Questions were answered to the patient's satisfaction.    Earnstine Regal, MD, [facility] Office: [place]

## 2015-02-10 NOTE — Transfer of Care (Signed)
Immediate Anesthesia Transfer of Care Note  [name]  Procedure(s) Performed: Procedure(s): OPEN RIGHT  COLECTOMY  (N/A) OPEN RETROPERINTONEAL EXPLORATION LEFT RENAL  CYST DECORTICATION X 5 (Left)  Patient Location: PACU  Anesthesia Type:General  Level of Consciousness: awake, alert  and oriented  Airway & Oxygen Therapy: Patient Spontanous Breathing and Patient connected to face mask oxygen  Post-op Assessment: Report given to RN  Post vital signs: Reviewed and stable  Last Vitals:  Filed Vitals:   02/10/15 0751  BP: 162/69  Pulse: 64  Temp: 36.7 C  Resp: 18    Complications: No apparent anesthesia complications

## 2015-02-10 NOTE — Anesthesia Procedure Notes (Signed)
Procedure Name: Intubation Date/Time: 02/10/2015 10:49 AM Performed by: Chandra Batch A Pre-anesthesia Checklist: Patient identified, Emergency Drugs available, Suction available, Patient being monitored and Timeout performed Patient Re-evaluated:Patient Re-evaluated prior to inductionOxygen Delivery Method: Circle system utilized Preoxygenation: Pre-oxygenation with 100% oxygen Intubation Type: IV induction Ventilation: Mask ventilation without difficulty Laryngoscope Size: Mac and 3 Grade View: Grade II Tube type: Oral Tube size: 7.0 mm Number of attempts: 1 (DL by Sanjuana Mae SRNA) Airway Equipment and Method: Stylet Placement Confirmation: ETT inserted through vocal cords under direct vision,  positive ETCO2 and breath sounds checked- equal and bilateral Secured at: 21 cm Tube secured with: Tape Dental Injury: Teeth and Oropharynx as per pre-operative assessment

## 2015-02-10 NOTE — Anesthesia Postprocedure Evaluation (Signed)
Anesthesia Post Note  [name]  Procedure(s) Performed: Procedure(s) (LRB): OPEN RIGHT  COLECTOMY  (N/A) OPEN RETROPERINTONEAL EXPLORATION LEFT RENAL  CYST DECORTICATION X 5 (Left)  Anesthesia type: general  Patient location: PACU  Post pain: Pain level controlled  Post assessment: Patient's Cardiovascular Status Stable  Last Vitals:  Filed Vitals:   02/10/15 1740  BP: 137/56  Pulse: 65  Temp: 36.9 C  Resp: 17    Post vital signs: Reviewed and stable  Level of consciousness: sedated  Complications: No apparent anesthesia complications

## 2015-02-10 NOTE — Progress Notes (Signed)
Pt cbg 227. Unable to upload in epic

## 2015-02-11 ENCOUNTER — Encounter (HOSPITAL_COMMUNITY): Payer: Self-pay | Admitting: Surgery

## 2015-02-11 LAB — BASIC METABOLIC PANEL
ANION GAP: 6 (ref 5–15)
BUN: 20 mg/dL (ref 6–20)
CALCIUM: 8.3 mg/dL — AB (ref 8.9–10.3)
CO2: 25 mmol/L (ref 22–32)
Chloride: 106 mmol/L (ref 101–111)
Creatinine, Ser: 1.27 mg/dL — ABNORMAL HIGH (ref 0.44–1.00)
GFR calc Af Amer: 47 mL/min — ABNORMAL LOW (ref >60)
GFR calc non Af Amer: 41 mL/min — ABNORMAL LOW (ref >60)
Glucose, Bld: 179 mg/dL — ABNORMAL HIGH (ref 65–99)
Potassium: 4.7 mmol/L (ref 3.5–5.1)
Sodium: 137 mmol/L (ref 135–145)

## 2015-02-11 LAB — CBC
HEMATOCRIT: 31.6 % — AB (ref 36.0–46.0)
Hemoglobin: 9.9 g/dL — ABNORMAL LOW (ref 12.0–15.0)
MCH: 26.4 pg (ref 26.0–34.0)
MCHC: 31.3 g/dL (ref 30.0–36.0)
MCV: 84.3 fL (ref 78.0–100.0)
Platelets: 213 10*3/uL (ref 150–400)
RBC: 3.75 MIL/uL — ABNORMAL LOW (ref 3.87–5.11)
RDW: 15.4 % (ref 11.5–15.5)
WBC: 16.2 10*3/uL — ABNORMAL HIGH (ref 4.0–10.5)

## 2015-02-11 LAB — GLUCOSE, CAPILLARY: GLUCOSE-CAPILLARY: 152 mg/dL — AB (ref 65–99)

## 2015-02-11 NOTE — Progress Notes (Signed)
1 Day Post-Op Subjective: Patient reports mild pain with PCA. She has nausea poorly controlled with zofran. Negative flatus.   Objective: Vital signs in last 24 hours: Temp:  [97.7 F (36.5 C)-98 F (36.7 C)] 97.7 F (36.5 C) (07/29 1827) Pulse Rate:  [65-75] 74 (07/29 1827) Resp:  [12-19] 16 (07/29 2020) BP: (116-152)/(54-95) 150/55 mmHg (07/29 1827) SpO2:  [94 %-97 %] 94 % (07/29 2020)  Intake/Output from previous day: 07/28 0701 - 07/29 0700 In: 4168.3 [I.V.:4168.3] Out: 1325 [Urine:1125; Blood:200] Intake/Output this shift:    Physical Exam:  General:alert, cooperative and appears stated age GI: not done, distended and tenderness: general mild  Female genitalia: not done Extremities: extremities normal, atraumatic, no cyanosis or edema  Lab Results:  Recent Labs  02/10/15 1755 02/11/15 0445  HGB 10.6* 9.9*  HCT 34.3* 31.6*   BMET  Recent Labs  02/10/15 1755 02/11/15 0445  NA  --  137  K  --  4.7  CL  --  106  CO2  --  25  GLUCOSE  --  179*  BUN  --  20  CREATININE 1.23* 1.27*  CALCIUM  --  8.3*   No results for input(s): LABPT, INR in the last 72 hours. No results for input(s): LABURIN in the last 72 hours. Results for orders placed or performed during the hospital encounter of 10/16/11  Clostridium Difficile by PCR     Status: None   Collection Time: 10/17/11  9:10 AM  Result Value Ref Range Status   C difficile by pcr NEGATIVE NEGATIVE Final    Studies/Results: No results found.  Assessment/Plan: -continue post op management per general surgery. I discussed treatment of her left renal mass which includes RFA and cryotherapy. We discussed timing of treatment which is usually several months after attempted partial nephrectomy. We also discussed surveillance of the left renal mass which includes [place] at 1 month. The patient is to followup in 1 month for renal US.    LOS: 1 day   Gurshan Settlemire L 02/11/2015, 9:54 PM

## 2015-02-11 NOTE — Op Note (Signed)
Cheryl Jordan, TIMBERMAN                   ACCOUNT NO.:  [id number]  MEDICAL RECORD NO.:  [id number]  LOCATION:  57                         FACILITY:  [facility]  PHYSICIAN:  Earnstine Regal, MD      DATE OF BIRTH:  1940-07-25  DATE OF PROCEDURE:  02/10/2015                              OPERATIVE REPORT   PREOPERATIVE DIAGNOSES: 1. Adenocarcinoma of the cecum. 2. Left renal mass.  POSTOPERATIVE DIAGNOSES: 1. Adenocarcinoma of the cecum. 2. Left renal mass.  PROCEDURE:  Right colectomy.  SURGEON:  Earnstine Regal, MD, FACS  ASSISTANT:  Gurney Maxin, M.D.  ANESTHESIA:  General per Dr. Lillia Abed.  ESTIMATED BLOOD LOSS:  100 mL.  PREPARATION:  ChloraPrep.  COMPLICATIONS:  None.  INDICATIONS:  The patient is a 74 year old female referred by Dr. Teena Irani for newly-diagnosed adenocarcinoma of the colon.  The patient had presented with anemia and heme-positive stools.  She underwent colonoscopy, which showed a mass in the cecum.  Biopsy was positive for invasive adenocarcinoma.  CT scan of the abdomen and pelvis confirmed a tumor in the cecum.  Liver was noted to have changes worrisome for cirrhosis.  The patient underwent MRI scan to evaluate the liver and was also noted to have a left renal mass.  Urology was consulted and Dr. Alyson Ingles agreed to perform evaluation of the kidney and possible partial nephrectomy at the time of right colectomy.  The patient now comes to the operating room.  BODY OF REPORT:  Procedure was done in OR #4 at the [facility].  The patient was brought to the operating room, placed in supine position on the operating room table.  Following administration of general anesthesia, the patient was positioned and then prepped and draped in the usual aseptic fashion.  After ascertaining that an adequate level of anesthesia had been achieved, a midline abdominal incision was made with #10 blade.  Dissection was carried through the subcutaneous  tissues.  Fascia was incised in the midline and the peritoneal cavity was entered cautiously.  Incision was extended to just below the umbilicus.  At the level of the umbilicus, permanent what appeared to be Ethibond sutures in the fascia.  These were removed.  Abdomen was explored.  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  There was rather extensive disease throughout the liver.  There was a large palpable mass in the cecum measuring approximately 4 cm in dimension.  Palpation in the pelvis showed no evidence of metastatic disease.  Omentum appeared normal without evidence of metastatic disease.  Bookwalter retractor was placed for exposure.  The right colon was then mobilized along its lateral peritoneal attachments.  Dissection was carried cephalad and the hepatic flexure of the colon was taken down using the LigaSure.  Dissection was carried around the base of the cecum and the appendix and up to the terminal ileum.  The terminal ileum was transected approximately 8 cm from the ileocecal valve with a GIA stapler.  Mesentery was divided with the LigaSure taking care to avoid the retroperitoneal structures.  Dissection was carried along the right mesocolon.  The duodenum was identified and  preserved.  A point in the proximal transverse colon was selected.  Omentum was mobilized off the proximal transverse colon allowing for division of the proximal transverse colon using a GIA stapler.  Remainder of the mesentery was then divided with the LigaSure and the specimen was removed from the patient and passed off the field for submission to Pathology.  Good hemostasis was noted.  Next, a side-to-side functionally end-to-end anastomosis was created between the terminal ileum and the proximal transverse colon.  This was performed with a GIA stapler.  Staple line was inspected for hemostasis.  Enterotomy was closed with a TA-60 stapler.  Mesenteric defect was closed  with interrupted 2-0 silk sutures.  Good hemostasis was noted throughout the operative field. Anastomosis appeared to be widely patent on palpation.  Omentum and a laparotomy sponge were placed over the area of the anastomosis after copious irrigation of the abdomen with normal saline.  At this point, Dr. Alyson Ingles from Urology was called to the operating room.  He proceeded with exploration of the left kidney.  This will be dictated under a separate operative report.  Following the conclusion of the urologic portion of the procedure, the abdomen was again briefly explored.  Fluid was evacuated.  All sponges were removed.  Midline incision was then closed with interrupted #1 Novafil simple sutures.  Subcutaneous tissues were irrigated.  Skin was closed with stainless steel staples.  Sterile dressings were applied. The patient was awakened from anesthesia and brought to the recovery room.  The patient tolerated the procedure well.    Earnstine Regal, MD, [facility] Office: [phone]    TMG/MEDQ  D:  02/10/2015  T:  02/11/2015  Job:  [id number]  cc:   Dr. Casimiro Needle C. Amedeo Plenty, M.D. Fax: [phone]

## 2015-02-11 NOTE — Progress Notes (Signed)
Patient ID: Cheryl Jordan, female   DOB: [birth year], 74 y.o.   MRN: [id number]  [facility]  POD#: 1  Subjective: Patient with mild pain, now on Dilaudid PCA.  Mild nausea, no emesis.  Up at bedside twice.  Objective: Vital signs in last 24 hours: Temp:  [97.4 F (36.3 C)-98.4 F (36.9 C)] 97.8 F (36.6 C) (07/29 0557) Pulse Rate:  [58-75] 70 (07/29 0557) Resp:  [11-19] 16 (07/29 0557) BP: (111-151)/(53-62) 149/62 mmHg (07/29 0557) SpO2:  [92 %-100 %] 95 % (07/29 0557) Last BM Date: 02/10/15  Intake/Output from previous day: 07/28 0701 - 07/29 0700 In: 4168.3 [I.V.:4168.3] Out: 1325 [Urine:1125; Blood:200] Intake/Output this shift:    Physical Exam: HEENT - sclerae clear, mucous membranes moist Neck - soft Chest - clear bilaterally Cor - RRR Abdomen - soft, midline wound dry and intact; few BS present Ext - no edema, non-tender Neuro - alert & oriented, no focal deficits  Lab Results:   Recent Labs  02/10/15 1755 02/11/15 0445  WBC 18.5* 16.2*  HGB 10.6* 9.9*  HCT 34.3* 31.6*  PLT 228 213   BMET  Recent Labs  02/10/15 1755 02/11/15 0445  NA  --  137  K  --  4.7  CL  --  106  CO2  --  25  GLUCOSE  --  179*  BUN  --  20  CREATININE 1.23* 1.27*  CALCIUM  --  8.3*   PT/INR No results for input(s): LABPROT, INR in the last 72 hours. Comprehensive Metabolic Panel:    Component Value Date/Time   NA 137 02/11/2015 0445   NA 142 01/03/2015 1055   K 4.7 02/11/2015 0445   K 4.4 01/03/2015 1055   CL 106 02/11/2015 0445   CL 107 01/03/2015 1055   CO2 25 02/11/2015 0445   CO2 27 01/03/2015 1055   BUN 20 02/11/2015 0445   BUN 15 01/03/2015 1055   CREATININE 1.27* 02/11/2015 0445   CREATININE 1.23* 02/10/2015 1755   GLUCOSE 179* 02/11/2015 0445   GLUCOSE 96 01/03/2015 1055   CALCIUM 8.3* 02/11/2015 0445   CALCIUM 9.3 01/03/2015 1055   AST 74* 02/07/2011 1940   ALT 54* 02/07/2011 1940   ALKPHOS 122* 02/07/2011 1940    BILITOT 0.6 02/07/2011 1940   PROT 7.9 02/07/2011 1940   ALBUMIN 4.1 02/07/2011 1940    Studies/Results: No results found.  Anti-infectives: Anti-infectives    Start     Dose/Rate Route Frequency Ordered Stop   02/10/15 0752  ertapenem (INVANZ) 1 g in sodium chloride 0.9 % 50 mL IVPB     1 g 100 mL/hr over 30 Minutes Intravenous On call to O.R. 02/10/15 0752 02/10/15 1045      Assessment & Plans: Status post right colectomy, marsupialization of renal cysts  Begin clear liquid diet  Encouraged OOB, ambulation  IS use  PCA for pain control  Earnstine Regal, MD, [facility] Office: [place] 02/11/2015

## 2015-02-12 LAB — GLUCOSE, CAPILLARY
GLUCOSE-CAPILLARY: 139 mg/dL — AB (ref 65–99)
GLUCOSE-CAPILLARY: 141 mg/dL — AB (ref 65–99)
GLUCOSE-CAPILLARY: 153 mg/dL — AB (ref 65–99)
GLUCOSE-CAPILLARY: 176 mg/dL — AB (ref 65–99)
Glucose-Capillary: 167 mg/dL — ABNORMAL HIGH (ref 65–99)
Glucose-Capillary: 164 mg/dL — ABNORMAL HIGH (ref 65–99)
Glucose-Capillary: 169 mg/dL — ABNORMAL HIGH (ref 65–99)
Glucose-Capillary: 155 mg/dL — ABNORMAL HIGH (ref 65–99)
Glucose-Capillary: 156 mg/dL — ABNORMAL HIGH (ref 65–99)
Glucose-Capillary: 134 mg/dL — ABNORMAL HIGH (ref 65–99)

## 2015-02-12 NOTE — Progress Notes (Signed)
Patient ID: Cheryl Jordan, female   DOB: Aug 19, 1940, 74 y.o.   MRN: [id number] [facility] Progress Note:   2 Days Post-Op  Subjective: Mental status is clear.  Burping but not passing flatus Objective: Vital signs in last 24 hours: Temp:  [97.6 F (36.4 C)-98 F (36.7 C)] 97.6 F (36.4 C) (07/30 0600) Pulse Rate:  [61-74] 70 (07/30 0600) Resp:  [15-20] 20 (07/30 0734) BP: (136-152)/(55-95) 150/58 mmHg (07/30 0600) SpO2:  [93 %-100 %] 100 % (07/30 0734)  Intake/Output from previous day: 07/29 0701 - 07/30 0700 In: 2486.7 [P.O.:60; I.V.:2426.7] Out: 2800 [Urine:2800] Intake/Output this shift:    Physical Exam: Work of breathing is normal.  No flatus.  Abdominal pain to be expected.    Lab Results:  Results for orders placed or performed during the hospital encounter of 02/10/15 (from the past 48 hour(s))  Glucose, capillary     Status: Abnormal   Collection Time: 02/10/15  2:49 PM  Result Value Ref Range   Glucose-Capillary 158 (H) 65 - 99 mg/dL   Comment 1 Notify RN    Comment 2 Document in Chart   Glucose, capillary     Status: Abnormal   Collection Time: 02/10/15  4:55 PM  Result Value Ref Range   Glucose-Capillary 227 (H) 65 - 99 mg/dL  CBC     Status: Abnormal   Collection Time: 02/10/15  5:55 PM  Result Value Ref Range   WBC 18.5 (H) 4.0 - 10.5 K/uL   RBC 4.05 3.87 - 5.11 MIL/uL   Hemoglobin 10.6 (L) 12.0 - 15.0 g/dL   HCT 34.3 (L) 36.0 - 46.0 %   MCV 84.7 78.0 - 100.0 fL   MCH 26.2 26.0 - 34.0 pg   MCHC 30.9 30.0 - 36.0 g/dL   RDW 15.2 11.5 - 15.5 %   Platelets 228 150 - 400 K/uL  Creatinine, serum     Status: Abnormal   Collection Time: 02/10/15  5:55 PM  Result Value Ref Range   Creatinine, Ser 1.23 (H) 0.44 - 1.00 mg/dL   GFR calc non Af Amer 42 (L) >60 mL/min   GFR calc Af Amer 49 (L) >60 mL/min    Comment: (NOTE) The eGFR has been calculated using the CKD EPI equation. This calculation has not been validated in all clinical  situations. eGFR's persistently <60 mL/min signify possible Chronic Kidney Disease.   Glucose, capillary     Status: Abnormal   Collection Time: 02/10/15  7:54 PM  Result Value Ref Range   Glucose-Capillary 208 (H) 65 - 99 mg/dL  Glucose, capillary     Status: Abnormal   Collection Time: 02/10/15 11:47 PM  Result Value Ref Range   Glucose-Capillary 186 (H) 65 - 99 mg/dL  Glucose, capillary     Status: Abnormal   Collection Time: 02/11/15  4:03 AM  Result Value Ref Range   Glucose-Capillary 152 (H) 65 - 99 mg/dL  Basic metabolic panel     Status: Abnormal   Collection Time: 02/11/15  4:45 AM  Result Value Ref Range   Sodium 137 135 - 145 mmol/L   Potassium 4.7 3.5 - 5.1 mmol/L   Chloride 106 101 - 111 mmol/L   CO2 25 22 - 32 mmol/L   Glucose, Bld 179 (H) 65 - 99 mg/dL   BUN 20 6 - 20 mg/dL   Creatinine, Ser 1.27 (H) 0.44 - 1.00 mg/dL   Calcium 8.3 (L) 8.9 - 10.3 mg/dL   GFR calc  non Af Amer 41 (L) >60 mL/min   GFR calc Af Amer 47 (L) >60 mL/min    Comment: (NOTE) The eGFR has been calculated using the CKD EPI equation. This calculation has not been validated in all clinical situations. eGFR's persistently <60 mL/min signify possible Chronic Kidney Disease.    Anion gap 6 5 - 15  CBC     Status: Abnormal   Collection Time: 02/11/15  4:45 AM  Result Value Ref Range   WBC 16.2 (H) 4.0 - 10.5 K/uL   RBC 3.75 (L) 3.87 - 5.11 MIL/uL   Hemoglobin 9.9 (L) 12.0 - 15.0 g/dL   HCT 31.6 (L) 36.0 - 46.0 %   MCV 84.3 78.0 - 100.0 fL   MCH 26.4 26.0 - 34.0 pg   MCHC 31.3 30.0 - 36.0 g/dL   RDW 15.4 11.5 - 15.5 %   Platelets 213 150 - 400 K/uL  Glucose, capillary     Status: Abnormal   Collection Time: 02/11/15  7:43 AM  Result Value Ref Range   Glucose-Capillary 153 (H) 65 - 99 mg/dL  Glucose, capillary     Status: Abnormal   Collection Time: 02/11/15 12:22 PM  Result Value Ref Range   Glucose-Capillary 167 (H) 65 - 99 mg/dL  Glucose, capillary     Status: Abnormal    Collection Time: 02/11/15  4:30 PM  Result Value Ref Range   Glucose-Capillary 139 (H) 65 - 99 mg/dL  Glucose, capillary     Status: Abnormal   Collection Time: 02/11/15  8:08 PM  Result Value Ref Range   Glucose-Capillary 164 (H) 65 - 99 mg/dL  Glucose, capillary     Status: Abnormal   Collection Time: 02/12/15 12:11 AM  Result Value Ref Range   Glucose-Capillary 141 (H) 65 - 99 mg/dL  Glucose, capillary     Status: Abnormal   Collection Time: 02/12/15  4:15 AM  Result Value Ref Range   Glucose-Capillary 169 (H) 65 - 99 mg/dL  Glucose, capillary     Status: Abnormal   Collection Time: 02/12/15  8:07 AM  Result Value Ref Range   Glucose-Capillary 176 (H) 65 - 99 mg/dL    Radiology/Results: No results found.  Anti-infectives: Anti-infectives    Start     Dose/Rate Route Frequency Ordered Stop   02/10/15 0752  ertapenem (INVANZ) 1 g in sodium chloride 0.9 % 50 mL IVPB     1 g 100 mL/hr over 30 Minutes Intravenous On call to O.R. 02/10/15 0752 02/10/15 1045      Assessment/Plan: Problem List: Patient Active Problem List   Diagnosis Date Noted  . 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 ARF (acute renal failure) 10/16/2011  . Viral gastroenteritis 10/16/2011  . HTN (hypertension) 10/16/2011  . Hypothyroidism 10/16/2011  . GERD (gastroesophageal reflux disease) 10/16/2011  . Glucose intolerance (impaired glucose tolerance) 10/16/2011    Still with anticipated postop ileus 2 Days Post-Op    LOS: 2 days   Matt B. Hassell Done, MD, [facility] [phone] beeper [phone]  02/12/2015 9:41 AM

## 2015-02-13 LAB — GLUCOSE, CAPILLARY
GLUCOSE-CAPILLARY: 144 mg/dL — AB (ref 65–99)
Glucose-Capillary: 113 mg/dL — ABNORMAL HIGH (ref 65–99)
Glucose-Capillary: 125 mg/dL — ABNORMAL HIGH (ref 65–99)
Glucose-Capillary: 143 mg/dL — ABNORMAL HIGH (ref 65–99)
Glucose-Capillary: 148 mg/dL — ABNORMAL HIGH (ref 65–99)
Glucose-Capillary: 172 mg/dL — ABNORMAL HIGH (ref 65–99)

## 2015-02-13 NOTE — Care Management Important Message (Signed)
Important Message  Patient Details  Name: Cheryl Jordan MRN: [id number] Date of Birth: May 26, 1941   Medicare Important Message Given:  Yes-second notification given    Apolonio Schneiders, RN 02/13/2015, 3:08 PM

## 2015-02-13 NOTE — Op Note (Addendum)
Preoperative diagnosis: Left renal mass  Postop diagnosis: Same  Procedure: 1.  Left open renal cysto decortication  Attending: Nicolette Bang, MD  Assistant: [name], MD  Anesthesia: General  Estimated blood loss: 50 cc  Drains: 16 French Foley catheter  Specimens: 1. Renal cyst [place] x 4  Antibiotics: cefoxitin  Findings: Multiple large renal cysts. Posterior renal mass unable to be resected due to dense fibrosis of the renal hilium not allowing for mobilization of the kidney  Indications: Patient is a 74 year old with a history of 2 cm left renal mass.  The is amenable to partial nephrectomy.  After discussing treatment options patient decided to proceed with left open partial nephrectomy.  Procedure in detail: Prior to procedure consent was obtained. Patient was brought to the operating room and briefing was done sure correct patient, correct procedure, correct site.  General anesthesia was in administered patient was placed in the supine position. She intially underwent right hemicolectomy by Dr. Harlow Asa. After completing the hemicolectomy we proceed with left partial nephrectomy.  We incised along the White line of [place]. We then entered the retroperitoneum. Using blunt dissection we freed the lateral and inferior aspect of the left kidney. We encounted 2 large renal cysts and proceeded to resect them. Once they were removed we then cauterized the bases. We then attempted to identify the renal hilium. There was dense fibrosis of the hilium and we could not dissect around the renal vein and artery for vascular control of the kideny. We then attempted to free the upper pole. We resected 2 more cyst in the upper and mid pole while trying to mobilize the kidney. We cauterized the bases of both cysts.  We identified the renal mass on the posterior aspect of the upper pole but we could not mobilize the kidney to resect the mass. We then decided to abandon the procedure due to the risk of  converting to radical nephrectomy and the bleeding risk associated with attempting to mobilize the kidney further. Dr. Tera Helper team then proceeded to close the incision. This concluded the procedure which well tolerated by the patient.  Complications: None  Condition: Stable, extubated, transferred to PACU.  Plan: Patient is to be admitted for inpatient stay. Once she has healed from her hemicoloctomy we will pursue ablation of her posterior renal mass.

## 2015-02-13 NOTE — Progress Notes (Signed)
Patient ID: Cheryl Jordan, female   DOB: [birth year], 74 y.o.   MRN: [id number] [facility] Surgery Progress Note:   3 Days Post-Op  Subjective: Mental status is clear.  Nauseated but passing a little flatus.   Objective: Vital signs in last 24 hours: Temp:  [98.2 F (36.8 C)-98.4 F (36.9 C)] 98.2 F (36.8 C) (07/31 0415) Pulse Rate:  [75-81] 75 (07/31 0415) Resp:  [14-21] 17 (07/31 0803) BP: (137-168)/(58-68) 161/68 mmHg (07/31 0415) SpO2:  [92 %-96 %] 94 % (07/31 0803)  Intake/Output from previous day: 07/30 0701 - 07/31 0700 In: 2640 [P.O.:240; I.V.:2400] Out: 1350 [Urine:1350] Intake/Output this shift:    Physical Exam: Work of breathing is not labored or increased.  Abdomen is appropriately sore  Lab Results:  Results for orders placed or performed during the hospital encounter of 02/10/15 (from the past 48 hour(s))  Glucose, capillary     Status: Abnormal   Collection Time: 02/11/15 12:22 PM  Result Value Ref Range   Glucose-Capillary 167 (H) 65 - 99 mg/dL  Glucose, capillary     Status: Abnormal   Collection Time: 02/11/15  4:30 PM  Result Value Ref Range   Glucose-Capillary 139 (H) 65 - 99 mg/dL  Glucose, capillary     Status: Abnormal   Collection Time: 02/11/15  8:08 PM  Result Value Ref Range   Glucose-Capillary 164 (H) 65 - 99 mg/dL  Glucose, capillary     Status: Abnormal   Collection Time: 02/12/15 12:11 AM  Result Value Ref Range   Glucose-Capillary 141 (H) 65 - 99 mg/dL  Glucose, capillary     Status: Abnormal   Collection Time: 02/12/15  4:15 AM  Result Value Ref Range   Glucose-Capillary 169 (H) 65 - 99 mg/dL  Glucose, capillary     Status: Abnormal   Collection Time: 02/12/15  8:07 AM  Result Value Ref Range   Glucose-Capillary 176 (H) 65 - 99 mg/dL  Glucose, capillary     Status: Abnormal   Collection Time: 02/12/15 12:22 PM  Result Value Ref Range   Glucose-Capillary 155 (H) 65 - 99 mg/dL  Glucose, capillary     Status: Abnormal   Collection  Time: 02/12/15  4:10 PM  Result Value Ref Range   Glucose-Capillary 156 (H) 65 - 99 mg/dL  Glucose, capillary     Status: Abnormal   Collection Time: 02/12/15  8:21 PM  Result Value Ref Range   Glucose-Capillary 134 (H) 65 - 99 mg/dL  Glucose, capillary     Status: Abnormal   Collection Time: 02/12/15 11:57 PM  Result Value Ref Range   Glucose-Capillary 125 (H) 65 - 99 mg/dL   Comment 1 Notify RN    Comment 2 Document in Chart   Glucose, capillary     Status: Abnormal   Collection Time: 02/13/15  4:16 AM  Result Value Ref Range   Glucose-Capillary 143 (H) 65 - 99 mg/dL   Comment 1 Notify RN    Comment 2 Document in Chart   Glucose, capillary     Status: Abnormal   Collection Time: 02/13/15  8:00 AM  Result Value Ref Range   Glucose-Capillary 172 (H) 65 - 99 mg/dL    Radiology/Results: No results found.  Anti-infectives: Anti-infectives    Start     Dose/Rate Route Frequency Ordered Stop   02/10/15 0752  ertapenem (INVANZ) 1 g in sodium chloride 0.9 % 50 mL IVPB     1 g 100 mL/hr over 30 Minutes  Intravenous On call to O.R. 02/10/15 0752 02/10/15 1045      Assessment/Plan: Problem List: Patient Active Problem List   Diagnosis Date Noted  . 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 ARF (acute renal failure) 10/16/2011  . Viral gastroenteritis 10/16/2011  . HTN (hypertension) 10/16/2011  . Hypothyroidism 10/16/2011  . GERD (gastroesophageal reflux disease) 10/16/2011  . Glucose intolerance (impaired glucose tolerance) 10/16/2011    Postop nausea but with flatus indicating resolution of ileus.  Not ready for diet advancement.  3 Days Post-Op    LOS: 3 days   Matt B. Hassell Done, MD, [facility] [phone] beeper [phone]  02/13/2015 9:32 AM

## 2015-02-14 LAB — GLUCOSE, CAPILLARY
GLUCOSE-CAPILLARY: 107 mg/dL — AB (ref 65–99)
GLUCOSE-CAPILLARY: 118 mg/dL — AB (ref 65–99)
GLUCOSE-CAPILLARY: 126 mg/dL — AB (ref 65–99)
GLUCOSE-CAPILLARY: 130 mg/dL — AB (ref 65–99)
GLUCOSE-CAPILLARY: 152 mg/dL — AB (ref 65–99)
Glucose-Capillary: 160 mg/dL — ABNORMAL HIGH (ref 65–99)

## 2015-02-14 MED ORDER — HYDROCODONE-ACETAMINOPHEN 5-325 MG PO TABS: 1.0000 | ORAL_TABLET | ORAL | Status: DC | PRN

## 2015-02-14 MED ORDER — ACETAMINOPHEN 325 MG PO TABS
650.0000 mg | ORAL_TABLET | ORAL | Status: DC | PRN
  Administered 2015-02-14: 650 mg via ORAL
  Filled 2015-02-14: qty 2

## 2015-02-14 NOTE — Progress Notes (Signed)
Patient ID: Cheryl Jordan, female   DOB: 1941-04-15, 74 y.o.   MRN: [id number]  [facility]  POD#: 4  Subjective: Patient comfortable, family at bedside.  Loose BM's, voiding.  Taking some regular food, mild nausea this AM.  Ambulating a limited amount.  Objective: Vital signs in last 24 hours: Temp:  [98.3 F (36.8 C)-98.9 F (37.2 C)] 98.3 F (36.8 C) (08/01 1000) Pulse Rate:  [76-101] 101 (08/01 1000) Resp:  [12-18] 18 (08/01 1000) BP: (160-178)/(61-67) 169/66 mmHg (08/01 1000) SpO2:  [93 %-96 %] 95 % (08/01 1000) Last BM Date: 02/13/15  Intake/Output from previous day: 07/31 0701 - 08/01 0700 In: 2760 [P.O.:360; I.V.:2400] Out: 2250 [Urine:2250] Intake/Output this shift: Total I/O In: 120 [P.O.:120] Out: 500 [Urine:500]  Physical Exam: HEENT - sclerae clear, mucous membranes moist Neck - soft Chest - clear bilaterally Cor - RRR Abdomen - soft, midline wound dry and intact; BS present Ext - no edema, non-tender Neuro - alert & oriented, no focal deficits  Lab Results:  No results for input(s): WBC, HGB, HCT, PLT in the last 72 hours. BMET No results for input(s): NA, K, CL, CO2, GLUCOSE, BUN, CREATININE, CALCIUM in the last 72 hours. PT/INR No results for input(s): LABPROT, INR in the last 72 hours. Comprehensive Metabolic Panel:    Component Value Date/Time   NA 137 02/11/2015 0445   NA 142 01/03/2015 1055   K 4.7 02/11/2015 0445   K 4.4 01/03/2015 1055   CL 106 02/11/2015 0445   CL 107 01/03/2015 1055   CO2 25 02/11/2015 0445   CO2 27 01/03/2015 1055   BUN 20 02/11/2015 0445   BUN 15 01/03/2015 1055   CREATININE 1.27* 02/11/2015 0445   CREATININE 1.23* 02/10/2015 1755   GLUCOSE 179* 02/11/2015 0445   GLUCOSE 96 01/03/2015 1055   CALCIUM 8.3* 02/11/2015 0445   CALCIUM 9.3 01/03/2015 1055   AST 74* 02/07/2011 1940   ALT 54* 02/07/2011 1940   ALKPHOS 122* 02/07/2011 1940   BILITOT 0.6 02/07/2011 1940   PROT 7.9  02/07/2011 1940   ALBUMIN 4.1 02/07/2011 1940    Studies/Results: No results found.  Anti-infectives: Anti-infectives    Start     Dose/Rate Route Frequency Ordered Stop   02/10/15 0752  ertapenem (INVANZ) 1 g in sodium chloride 0.9 % 50 mL IVPB     1 g 100 mL/hr over 30 Minutes Intravenous On call to O.R. 02/10/15 0752 02/10/15 1045      Assessment & Plans: Status post right colectomy  Await path  Regular diet  Anticipate discharge home tomorrow if nausea resolves and diet tolerated  Earnstine Regal, MD, [facility] Office: [place] 02/14/2015

## 2015-02-14 NOTE — Care Management Important Message (Signed)
Important Message  Patient Details  Name: Cheryl Jordan MRN: [id number] Date of Birth: 04-Jun-1941   Medicare Important Message Given:  Yes-third notification given    [name] 02/14/2015, 3:38 [place] Message  Patient Details  Name: Cheryl Jordan MRN: [id number] Date of Birth: [birth year]   Medicare Important Message Given:  Yes-third notification given    [name] 02/14/2015, 3:37 PM

## 2015-02-15 LAB — GLUCOSE, CAPILLARY
GLUCOSE-CAPILLARY: 129 mg/dL — AB (ref 65–99)
Glucose-Capillary: 145 mg/dL — ABNORMAL HIGH (ref 65–99)
Glucose-Capillary: 124 mg/dL — ABNORMAL HIGH (ref 65–99)
Glucose-Capillary: 131 mg/dL — ABNORMAL HIGH (ref 65–99)

## 2015-02-15 MED ORDER — INSULIN ASPART 100 UNIT/ML ~~LOC~~ SOLN: 0.0000 [IU] | Freq: Every day | SUBCUTANEOUS | Status: DC

## 2015-02-15 MED ORDER — INSULIN ASPART 100 UNIT/ML ~~LOC~~ SOLN
0.0000 [IU] | Freq: Three times a day (TID) | SUBCUTANEOUS | Status: DC
  Administered 2015-02-15 (×3): 2 [IU] via SUBCUTANEOUS
  Administered 2015-02-16: 3 [IU] via SUBCUTANEOUS

## 2015-02-15 MED ORDER — INSULIN ASPART 100 UNIT/ML ~~LOC~~ SOLN: 0.0000 [IU] | Freq: Three times a day (TID) | SUBCUTANEOUS | Status: DC

## 2015-02-15 MED ORDER — PROMETHAZINE HCL 25 MG/ML IJ SOLN
12.5000 mg | INTRAMUSCULAR | Status: DC | PRN
  Administered 2015-02-15: 12.5 mg via INTRAVENOUS
  Filled 2015-02-15: qty 1

## 2015-02-15 NOTE — Progress Notes (Signed)
Patient ID: Cheryl Jordan, female   DOB: 04/13/1941, 74 y.o.   MRN: [id number]  [facility]  POD#: 5  Subjective: Patient with nausea, no emesis.  Poor po intake of solid food.  Diarrhea every 3-4 hours overnight.  Objective: Vital signs in last 24 hours: Temp:  [98.3 F (36.8 C)-99.4 F (37.4 C)] 98.3 F (36.8 C) (08/02 0556) Pulse Rate:  [77-105] 103 (08/02 0556) Resp:  [16-18] 16 (08/02 0556) BP: (153-180)/(61-66) 153/66 mmHg (08/02 0556) SpO2:  [92 %-96 %] 95 % (08/02 0556) Last BM Date: 02/14/15  Intake/Output from previous day: 08/01 0701 - 08/02 0700 In: 1697.5 [P.O.:360; I.V.:1337.5] Out: 1951 [Urine:1950; Stool:1] Intake/Output this shift: Total I/O In: -  Out: 350 [Urine:350]  Physical Exam: HEENT - sclerae clear, mucous membranes moist Neck - soft Chest - clear bilaterally Cor - RRR Abdomen - soft, wound dry and intact, dressing removed Ext - no edema, non-tender Neuro - alert & oriented, no focal deficits  Lab Results:  No results for input(s): WBC, HGB, HCT, PLT in the last 72 hours. BMET No results for input(s): NA, K, CL, CO2, GLUCOSE, BUN, CREATININE, CALCIUM in the last 72 hours. PT/INR No results for input(s): LABPROT, INR in the last 72 hours. Comprehensive Metabolic Panel:    Component Value Date/Time   NA 137 02/11/2015 0445   NA 142 01/03/2015 1055   K 4.7 02/11/2015 0445   K 4.4 01/03/2015 1055   CL 106 02/11/2015 0445   CL 107 01/03/2015 1055   CO2 25 02/11/2015 0445   CO2 27 01/03/2015 1055   BUN 20 02/11/2015 0445   BUN 15 01/03/2015 1055   CREATININE 1.27* 02/11/2015 0445   CREATININE 1.23* 02/10/2015 1755   GLUCOSE 179* 02/11/2015 0445   GLUCOSE 96 01/03/2015 1055   CALCIUM 8.3* 02/11/2015 0445   CALCIUM 9.3 01/03/2015 1055   AST 74* 02/07/2011 1940   ALT 54* 02/07/2011 1940   ALKPHOS 122* 02/07/2011 1940   BILITOT 0.6 02/07/2011 1940   PROT 7.9 02/07/2011 1940   ALBUMIN 4.1 02/07/2011  1940    Studies/Results: No results found.  Anti-infectives: Anti-infectives    Start     Dose/Rate Route Frequency Ordered Stop   02/10/15 0752  ertapenem (INVANZ) 1 g in sodium chloride 0.9 % 50 mL IVPB     1 g 100 mL/hr over 30 Minutes Intravenous On call to O.R. 02/10/15 0752 02/10/15 1045      Assessment & Plans: Status post right colectomy  Path shows 5.1 cm tumor with 8/20 positive nodes - will need oncology consult 3-4 weeks  Encouraged regular diet  Encouraged OOB, ambulate  Rx with Phenergan for nausea  Probably home in AM 8/3  Earnstine Regal, MD, [facility] Office: [place] 02/15/2015

## 2015-02-16 ENCOUNTER — Telehealth: Payer: Self-pay | Admitting: *Deleted

## 2015-02-16 DIAGNOSIS — C189 Malignant neoplasm of colon, unspecified: Secondary | ICD-10-CM

## 2015-02-16 LAB — GLUCOSE, CAPILLARY: GLUCOSE-CAPILLARY: 137 mg/dL — AB (ref 65–99)

## 2015-02-16 MED ORDER — PROMETHAZINE HCL 25 MG PO TABS: 25.0000 mg | ORAL_TABLET | Freq: Four times a day (QID) | ORAL | Status: DC | PRN

## 2015-02-16 MED ORDER — HYDROCODONE-ACETAMINOPHEN 5-325 MG PO TABS: 1.0000 | ORAL_TABLET | ORAL | Status: DC | PRN

## 2015-02-16 NOTE — Discharge Instructions (Signed)
[facility] ° °OPEN ABDOMINAL SURGERY: POST OP INSTRUCTIONS ° °Always review your discharge instruction sheet given to you by the facility where your surgery was performed. ° °1. A prescription for pain medication may be given to you upon discharge.  Take your pain medication as prescribed.  If narcotic pain medicine is not needed, then you may take acetaminophen (Tylenol) or ibuprofen (Advil) as needed. °2. Take your usually prescribed medications unless otherwise directed. °3. If you need a refill on your pain medication, please contact your pharmacy. They will contact our office to request authorization.  Prescriptions will not be filled after 5 pm or on weekends. °4. You should follow a light diet the first few days after arrival home, such as soup and crackers, unless your doctor has advised otherwise. A high-fiber, low fat diet can be resumed as tolerated.  Be sure to include plenty of fluids daily.  °5. Most patients will experience some swelling and bruising in the area of the incision. Ice packs will help. Swelling and bruising can take several days to resolve. °6. It is common to experience some constipation if taking pain medication after surgery.  Increasing fluid intake and taking a stool softener will usually help or prevent this problem from occurring.  A mild laxative (Milk of Magnesia or Miralax) should be taken according to package directions if there are no bowel movements after 48 hours. °7.  You may have steri-strips (small skin tapes) in place directly over the incision.  These strips should be left on the skin for 5-7 days.  Any sutures or staples will be removed at the office during your follow-up visit. You may find that a light gauze bandage over your incision may keep your staples from being rubbed or pulled. You may shower and replace the bandage daily. °8. ACTIVITIES:  You may resume regular (light) daily activities beginning the next day - such as daily self-care,  walking, climbing stairs - gradually increasing activities as tolerated.  You may have sexual intercourse when it is comfortable.  Refrain from any heavy lifting or straining until approved by your doctor.  You may drive when you no longer are taking prescription pain medication, you can comfortably wear a seatbelt, and you can safely maneuver your car and apply brakes. °9. You should see your doctor in the office for a follow-up appointment approximately 2-3 weeks after your surgery.  Make sure that you call for this appointment within a day or two after you arrive home to insure a convenient appointment time. ° °WHEN TO CALL YOUR DOCTOR: °1. Fever greater than 101.0 °2. Inability to urinate °3. Persistent nausea and/or vomiting °4. Extreme swelling or bruising °5. Continued bleeding from incision °6. Increased pain, redness, or drainage from the incision °7. Difficulty swallowing or breathing °8. Muscle cramping or spasms °9. Numbness or tingling in hands or around lips ° °IF YOU HAVE DISABILITY OR FAMILY LEAVE FORMS, YOU MUST BRING THEM TO THE OFFICE FOR PROCESSING.  PLEASE DO NOT GIVE THEM TO YOUR DOCTOR. ° °The clinic staff is available to answer your questions during regular business hours.  Please don’t hesitate to call and ask to speak to one of the nurses if you have concerns. ° °[facility] °Office: [phone] ° °For further questions, please visit °[email or web address] ° ° °

## 2015-02-16 NOTE — Care Management Note (Signed)
Case Management Note  Patient Details  Name: Cheryl Jordan MRN: [id number] Date of Birth: 07-19-1940  Subjective/Objective:         S/p exploratory laparotomy, right colectomy, and exploration of left kidney           Action/Plan: Discharge planning  Expected Discharge Date:                  Expected Discharge Plan:  [place]  In-House Referral:  NA  Discharge planning Services  CM Consult  Post Acute Care Choice:  Home Health Choice offered to:  Patient  DME Arranged:    DME Agency:     HH Arranged:  PT [place]:  [place]  Status of Service:  Completed, signed off  Medicare Important Message Given:  Yes-third notification given Date Medicare IM Given:    Medicare IM give by:    Date Additional Medicare IM Given:    Additional Medicare Important Message give by:     If discussed at [place] of Stay Meetings, dates discussed:    Additional Comments:  Guadalupe Maple, RN 02/16/2015, 10:49 AM

## 2015-02-16 NOTE — Progress Notes (Signed)
Patient's vital signs WNL, tolerating diet well, pain controlled, incision site without signs or symptoms of infection.  Discharge instructions reviewed with patient.  Pt. Had no questions or concerns.  Pt. Discharged to home.

## 2015-02-16 NOTE — Discharge Summary (Signed)
  Physician Discharge Summary [facility]  Patient ID: Cheryl Jordan MRN: [id number] DOB/AGE: 03/11/1941 74 y.o.  Admit date: 02/10/2015 Discharge date: 02/16/2015  Admission Diagnoses:  Colon cancer, left renal mass  Discharge Diagnoses:  Principal Problem:   Adenocarcinoma of colon   Discharged Condition: good  Hospital Course: patient admitted after exploratory laparotomy, right colectomy, and exploration of left kidney.  Post op course complicated by diarrhea and nausea.  Gradual advancement of diet and resolution of diarrheal stools.  Tolerating regular diet.  Ambulating.  Prepared for discharge on POD#6.  Consults: urology  Treatments: surgery: right colectomy  Discharge Exam: Blood pressure 157/70, pulse 100, temperature 98.2 F (36.8 C), temperature source Oral, resp. rate 16, height 5\' 8"  (1.727 m), weight 96.163 kg (212 lb), SpO2 93 %. HEENT - clear Neck - soft Chest - clear bilaterally Cor - RRR Abd - wound dry and intact, staples in place; BS active  Disposition: Home  Discharge Instructions    Diet - low sodium heart healthy    Complete by:  As directed      Increase activity slowly    Complete by:  As directed      No dressing needed    Complete by:  As directed             Medication List    TAKE these medications        amLODipine 5 MG tablet  Commonly known as:  NORVASC  Take 5 mg by mouth every morning.     atenolol 50 MG tablet  Commonly known as:  TENORMIN  Take 50 mg by mouth every morning.     esomeprazole 20 MG capsule  Commonly known as:  NEXIUM  Take 20 mg by mouth daily at 12 noon.     glipiZIDE 5 MG 24 hr tablet  Commonly known as:  GLUCOTROL XL  Take 5 mg by mouth daily with breakfast.     HYDROcodone-acetaminophen 5-325 MG per tablet  Commonly known as:  NORCO/VICODIN  Take 1-2 tablets by mouth every 4 (four) hours as needed for moderate pain.     levothyroxine 88 MCG tablet  Commonly known as:  SYNTHROID,  LEVOTHROID  Take 88 mcg by mouth daily.     losartan 50 MG tablet  Commonly known as:  COZAAR  Take 25 mg by mouth every morning.     promethazine 25 MG tablet  Commonly known as:  PHENERGAN  Take 1 tablet (25 mg total) by mouth every 6 (six) hours as needed for nausea or vomiting.           Follow-up Information    Follow up with Earnstine Regal, MD. Schedule an appointment as soon as possible for a visit in 5 days.   Specialty:  General Surgery   Why:  For suture removal   Contact information:   [address] [place] [address] [phone]       Earnstine Regal, MD, [facility] Office: [phone]   Signed: Earnstine Regal 02/16/2015, 8:26 AM

## 2015-02-16 NOTE — Telephone Encounter (Signed)
Called patient to be seen in GI Clinic on 02/18/15 per referral of Dr. Harlow Asa. Patient being discharged today and declines to be seen this week. Wishes to wait until next week.

## 2015-02-17 ENCOUNTER — Telehealth: Payer: Self-pay | Admitting: Oncology

## 2015-02-17 NOTE — Telephone Encounter (Signed)
new patient appt-s/w patient and gave np appt for 08/05 @ 10:30 w/Dr. Burr Medico. Referral Dr. Harlow Asa Dx-Colon Ca/renal cell carcinoma

## 2015-02-24 ENCOUNTER — Ambulatory Visit (HOSPITAL_BASED_OUTPATIENT_CLINIC_OR_DEPARTMENT_OTHER): Payer: Medicare Other

## 2015-02-24 ENCOUNTER — Telehealth: Payer: Self-pay | Admitting: Oncology

## 2015-02-24 ENCOUNTER — Ambulatory Visit: Payer: Medicare Other

## 2015-02-24 ENCOUNTER — Encounter: Payer: Self-pay | Admitting: Oncology

## 2015-02-24 ENCOUNTER — Ambulatory Visit (HOSPITAL_BASED_OUTPATIENT_CLINIC_OR_DEPARTMENT_OTHER): Payer: Medicare Other | Admitting: Oncology

## 2015-02-24 VITALS — BP 161/66 | HR 71 | Temp 98.7°F | Resp 18 | Ht 68.0 in | Wt 203.0 lb

## 2015-02-24 DIAGNOSIS — C189 Malignant neoplasm of colon, unspecified: Secondary | ICD-10-CM

## 2015-02-24 DIAGNOSIS — N289 Disorder of kidney and ureter, unspecified: Secondary | ICD-10-CM | POA: Diagnosis not present

## 2015-02-24 LAB — COMPREHENSIVE METABOLIC PANEL (CC13)
ALT: 20 U/L (ref 0–55)
AST: 36 U/L — AB (ref 5–34)
Albumin: 3.2 g/dL — ABNORMAL LOW (ref 3.5–5.0)
Alkaline Phosphatase: 101 U/L (ref 40–150)
Anion Gap: 8 mEq/L (ref 3–11)
BILIRUBIN TOTAL: 0.68 mg/dL (ref 0.20–1.20)
BUN: 11.7 mg/dL (ref 7.0–26.0)
CHLORIDE: 107 meq/L (ref 98–109)
CO2: 26 mEq/L (ref 22–29)
Calcium: 8.6 mg/dL (ref 8.4–10.4)
Creatinine: 1.2 mg/dL — ABNORMAL HIGH (ref 0.6–1.1)
EGFR: 43 mL/min/{1.73_m2} — ABNORMAL LOW (ref >90)
Glucose: 93 mg/dl (ref 70–140)
Potassium: 4.2 mEq/L (ref 3.5–5.1)
Sodium: 142 mEq/L (ref 136–145)
Total Protein: 6.5 g/dL (ref 6.4–8.3)

## 2015-02-24 LAB — CBC WITH DIFFERENTIAL/PLATELET
BASO%: 0.4 % (ref 0.0–2.0)
BASOS ABS: 0 10*3/uL (ref 0.0–0.1)
EOS%: 2.5 % (ref 0.0–7.0)
Eosinophils Absolute: 0.2 10*3/uL (ref 0.0–0.5)
HCT: 30.6 % — ABNORMAL LOW (ref 34.8–46.6)
HEMOGLOBIN: 9.4 g/dL — AB (ref 11.6–15.9)
LYMPH#: 2.8 10*3/uL (ref 0.9–3.3)
LYMPH%: 34.9 % (ref 14.0–49.7)
MCH: 25.8 pg (ref 25.1–34.0)
MCHC: 30.7 g/dL — ABNORMAL LOW (ref 31.5–36.0)
MCV: 83.8 fL (ref 79.5–101.0)
MONO#: 0.5 10*3/uL (ref 0.1–0.9)
MONO%: 6.3 % (ref 0.0–14.0)
NEUT#: 4.5 10*3/uL (ref 1.5–6.5)
NEUT%: 55.9 % (ref 38.4–76.8)
Platelets: 220 10*3/uL (ref 145–400)
RBC: 3.65 10*6/uL — ABNORMAL LOW (ref 3.70–5.45)
RDW: 15.8 % — AB (ref 11.2–14.5)
WBC: 8 10*3/uL (ref 3.9–10.3)
nRBC: 0 % (ref 0–0)

## 2015-02-24 NOTE — Progress Notes (Signed)
Checked in new pt with no financial concerns prior to seeing the dr.  Pt has my card for any billing questions, concerns or if financial assistance is needed.  ° °

## 2015-02-24 NOTE — Progress Notes (Signed)
[place] Patient Consult   Referring MD: Jayleen Scaglione Plante 74 y.o.  06/21/1941    Reason for Referral: Colon cancer   HPI: Cheryl Jordan reports developing dark stool at the beginning of 2016. She reports Dr. Edrick Oh confirmed a Hemoccult-positive stool and she was referred to Dr. Amedeo Plenty. A colonoscopy (we do not have the report available today) confirmed a mass in the cecum. A CT of the abdomen and pelvis 11/23/2014 revealed evidence of cirrhosis. A focal area of low attenuation in the posterior right hepatic lobe measures 1.2 cm. Numerous bilateral renal cysts. A cyst arising from the posterior aspect of the "right "kidney had a partial soft tissue component measuring 2.8 cm. A mass involving the base of the cecum with small ileocolic lymph nodes in the right lower quadrant. No enlarged retroperitoneal or mesenteric adenopathy. She was referred for an MRI of the abdomen on 12/19/2014. The right hepatic lobe lesion was confirmed to represent a benign hemangioma. The liver had a nodular contour consistent with cirrhosis. Mild splenomegaly. 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  She was referred to Dr. Harlow Asa and was taken to the operating room on 02/10/2015 for a right colectomy. There was evidence of cirrhosis of the liver. A palpable mass was noted in the cecum. No evidence of metastatic disease. The terminal ileum was transected approximate 7 m from the ileocecal valve a side to side anastomosis was created between the terminal ileum and proximal transverse colon. Dr. Alyson Ingles resected to cysts from the left kidney. A mass was identified at the upper aspect of the left kidney, but the kidney could not be mobilized for resection. The planned partial nephrectomy was aborted.  She reports a decreased appetite following surgery. She has mild discomfort at the left lateral  iliac.  Past Medical History  Diagnosis Date  . Hypertension   . PONV (postoperative nausea and vomiting)   . History of gout   . Colon cancer-cecum (T4b, N2b) C's   02/02/2015   . GERD (gastroesophageal reflux disease)   . Diabetes mellitus without complication   . Hypothyroidism   . Arthritis   . Anemia     hx of    .   Left renal mass                                                                                                 11/23/2014  Past Surgical History  Procedure Laterality Date  . Cholecystectomy    . Knee surgery-arthroscopy     . Tubal ligation    . Partial colectomy N/A 02/10/2015    Procedure: OPEN RIGHT  COLECTOMY ;  Surgeon: [name], MD;  Location: [facility];  Service: General;  Laterality: N/A;  . Nephrectomy Left 02/10/2015    Procedure: OPEN RETROPERINTONEAL EXPLORATION LEFT RENAL  CYST DECORTICATION X 5;  Surgeon: Cleon Gustin, MD;  Location: [facility];  Service: Urology;  Laterality: Left;    Medications: Reviewed  Allergies:  Allergies  Allergen Reactions  . Aspirin Other (See Comments)    Runny nose   . Codeine Nausea And Vomiting    Family history: A brother had lung cancer and was a smoker. No other family history cancer.  Social History:   She lives in [place]. She is retired from a textile occupation. She does not use cigarettes or alcohol. No transfusion history. No risk factor for HIV or hepatitis.     ROS:   Positives include: Occasional solid dysphagia, dark stool beginning in January 2016  A complete ROS was otherwise negative.  Physical Exam:  Blood pressure 161/66, pulse 71, temperature 98.7 F (37.1 C), temperature source Oral, resp. rate 18, height _0  (1.727 m), weight 203 lb (92.08 kg), SpO2 97 %.  HEENT: Partial upper and lower denture plate, oropharynx without visible mass, neck without mass Lungs: Clear bilaterally Cardiac: Regular rate and rhythm Abdomen: Healed midline incision with Steri-Strips in place,  no hepatomegaly, nontender, no mass  Vascular: No leg edema Lymph nodes: No cervical, supraclavicular, axillary, or inguinal nodes Neurologic: Alert and oriented, the motor exam appears intact in the upper and lower extremities Skin: No rash Musculoskeletal: No spine tenderness, mild tenderness at the left lateral iliac without a palpable mass   LAB:  CBC  Lab Results  Component Value Date   WBC 8.0 02/24/2015   HGB 9.4* 02/24/2015   HCT 30.6* 02/24/2015   MCV 83.8 02/24/2015   PLT 220 02/24/2015   NEUTROABS 4.5 02/24/2015       Imaging:  CT abdomen/pelvis 11/23/2014-reviewed   Assessment/Plan:   1. 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  Loss of MLH1 and PMS2 expression  2. Anemia  3.   Left renal mass suspicious for a renal cell carcinoma  4.   Diabetes  5.   Hypertension   Disposition:   Cheryl Jordan has been diagnosed with stage III colon cancer. I reviewed the details of the surgical pathology report with her. We discussed the prognosis and adjuvant treatment options. She is at high risk of developing recurrent colon cancer over the next several years. I recommend adjuvant systemic chemotherapy. We reviewed the benefit associated with 5-fluorouracil-based chemotherapy in this setting. We also discussed the small absolute benefit associated with the addition of oxaliplatin. I explained the lack of a clear benefit for oxaliplatin in her age group, the need for a Port-A-Cath with the initiation of oxaliplatin, and the potential toxicity associated with this agent. She indicated that she does not wish to consider oxaliplatin-based therapy.  I recommend capecitabine. We reviewed the potential toxicities  associated with capecitabine including the chance for nausea, mucositis, alopecia, diarrhea, and hematologic toxicity. We discussed the sun sensitivity, hyperpigmentation, and hand/foot syndrome associated with capecitabine. She agrees to proceed. She will attend a chemotherapy teaching class.  Cheryl Jordan will be referred for a staging chest CT and we will check the CEA today. She will return for an office visit and chemotherapy teaching class 03/03/2015.  The plan is to begin adjuvant capecitabine after the 03/03/2015 visit.  She will see Dr. Alyson Ingles next week to discuss management of the left renal mass. I think it is reasonable to delay resection of the left renal mass until the completion of adjuvant therapy. We will proceed with adjuvant capecitabine  if Dr. Alyson Ingles is in agreement.  Approximate 50 minutes were spent with the patient today. The majority of time was used for counseling and coordination of care.       [place], [place] 02/24/2015, 6:07 PM

## 2015-02-24 NOTE — Telephone Encounter (Signed)
Patient sent back to lab and given avs report with appointments for August. [facility] radiology will contact patient re ct appointment.

## 2015-02-25 LAB — CEA: CEA: 3.5 ng/mL (ref 0.0–5.0)

## 2015-02-28 ENCOUNTER — Other Ambulatory Visit: Payer: Medicare Other

## 2015-02-28 ENCOUNTER — Encounter: Payer: Self-pay | Admitting: *Deleted

## 2015-02-28 NOTE — Progress Notes (Signed)
Email to pathology requesting confirmation of MSI testing ordered and to request BRAF testing to following case:  [name], Cheryl Jordan Collected: 02/10/2015 Client: [facility] Accession: [id number] Received: 02/10/2015 [name] DOB: Oct 30, 1940 Age: 74 Gender: F Reported: 02/15/2015 [address] Patient Ph: [phone] MRN #: [id number] [place], [place] [id number]  Also added for GI Cancer Conference on 03/09/15. REPORT

## 2015-03-03 ENCOUNTER — Ambulatory Visit (HOSPITAL_BASED_OUTPATIENT_CLINIC_OR_DEPARTMENT_OTHER): Payer: Medicare Other | Admitting: Oncology

## 2015-03-03 ENCOUNTER — Ambulatory Visit (HOSPITAL_COMMUNITY)
Admission: RE | Admit: 2015-03-03 | Discharge: 2015-03-03 | Disposition: A | Discharge status: Home / Self Care | Payer: Medicare Other | Source: Ambulatory Visit | Attending: Oncology | Admitting: Oncology

## 2015-03-03 ENCOUNTER — Telehealth: Payer: Self-pay | Admitting: Oncology

## 2015-03-03 ENCOUNTER — Encounter: Payer: Self-pay | Admitting: Oncology

## 2015-03-03 ENCOUNTER — Encounter: Payer: Self-pay | Admitting: *Deleted

## 2015-03-03 ENCOUNTER — Encounter (HOSPITAL_COMMUNITY): Payer: Self-pay

## 2015-03-03 VITALS — BP 153/67 | HR 67 | Temp 98.1°F | Resp 18 | Ht 68.0 in | Wt 199.3 lb

## 2015-03-03 DIAGNOSIS — D649 Anemia, unspecified: Secondary | ICD-10-CM

## 2015-03-03 DIAGNOSIS — C189 Malignant neoplasm of colon, unspecified: Secondary | ICD-10-CM

## 2015-03-03 DIAGNOSIS — J90 Pleural effusion, not elsewhere classified: Secondary | ICD-10-CM | POA: Diagnosis not present

## 2015-03-03 DIAGNOSIS — C778 Secondary and unspecified malignant neoplasm of lymph nodes of multiple regions: Secondary | ICD-10-CM | POA: Diagnosis not present

## 2015-03-03 DIAGNOSIS — I708 Atherosclerosis of other arteries: Secondary | ICD-10-CM | POA: Insufficient documentation

## 2015-03-03 DIAGNOSIS — I358 Other nonrheumatic aortic valve disorders: Secondary | ICD-10-CM | POA: Diagnosis not present

## 2015-03-03 DIAGNOSIS — C180 Malignant neoplasm of cecum: Secondary | ICD-10-CM | POA: Diagnosis not present

## 2015-03-03 DIAGNOSIS — N289 Disorder of kidney and ureter, unspecified: Secondary | ICD-10-CM | POA: Diagnosis not present

## 2015-03-03 DIAGNOSIS — I700 Atherosclerosis of aorta: Secondary | ICD-10-CM | POA: Insufficient documentation

## 2015-03-03 DIAGNOSIS — I517 Cardiomegaly: Secondary | ICD-10-CM | POA: Insufficient documentation

## 2015-03-03 NOTE — Progress Notes (Signed)
[place] OFFICE PROGRESS NOTE   Diagnosis: Colon cancer  INTERVAL HISTORY:   Cheryl Jordan returns as scheduled. She continues to have intermittent nausea and anorexia. She would like to wait on starting the adjuvant chemotherapy for a few more weeks. She saw Dr. Alyson Ingles and the plan is to observe the renal mass. She attended a chemotherapy teaching class earlier this week.  Objective:  Vital signs in last 24 hours:  Blood pressure 153/67, pulse 67, temperature 98.1 F (36.7 C), temperature source Oral, resp. rate 18, height $RemoveBe'5\' 8"'WEbilauLR$  (1.727 m), weight 199 lb 4.8 oz (90.402 kg), SpO2 98 %.    Resp: Lungs clear bilaterally Cardio: Regular rate and rhythm GI: Mild tenderness throughout the right abdomen, no hepatomegaly, no mass. Healed midline incision with Steri-Strips in place Vascular: No leg edema  Lab Results:  Lab Results  Component Value Date   WBC 8.0 02/24/2015   HGB 9.4* 02/24/2015   HCT 30.6* 02/24/2015   MCV 83.8 02/24/2015   PLT 220 02/24/2015   NEUTROABS 4.5 02/24/2015     Lab Results  Component Value Date   CEA 3.5 02/24/2015    Imaging:  Ct Chest Wo Contrast  03/03/2015   CLINICAL DATA:  74 year old female with history of colon cancer (diagnosed in July 2016). Evaluate for potential metastatic disease.  EXAM: CT CHEST WITHOUT CONTRAST  TECHNIQUE: Multidetector CT imaging of the chest was performed following the standard protocol without IV contrast.  COMPARISON:  No priors.  FINDINGS: Mediastinum/Lymph Nodes: Heart size is mildly enlarged. There is no significant pericardial fluid, thickening or pericardial calcification. Mild calcifications of the aortic valve. Mild atherosclerotic calcifications throughout the thoracic aorta and great vessels of the mediastinum. No definite coronary artery calcifications are noted at this time. No pathologically enlarged mediastinal or hilar lymph nodes. Please note that accurate exclusion of hilar adenopathy is  limited on noncontrast CT scans. Mild dilatation of the pulmonic trunk (3.3 cm in diameter). Esophagus is unremarkable in appearance. No axillary lymphadenopathy.  Lungs/Pleura: Trace left pleural effusion lying dependently. Linear areas of scarring and/or atelectasis in the lower lobes of the lungs bilaterally. No suspicious appearing pulmonary nodules or masses. No acute consolidative airspace disease.  Upper Abdomen: Pneumobilia, presumably related to prior sphincterotomy. The liver has a very nodular contour, compatible with underlying cirrhosis. Surgical clips around the left kidney where there are extensive inflammatory changes in the retroperitoneal fat, presumably related to recent left renal cyst decortication. Exophytic lesion from the posterior aspect of the upper pole of the left kidney, similar prior studies, previously characterized as a cyst.  Musculoskeletal/Soft Tissues: There are no aggressive appearing lytic or blastic lesions noted in the visualized portions of the skeleton.  IMPRESSION: 1. No definite findings to suggest metastatic disease to the thorax. 2. Trace left pleural effusion lying dependently. 3. Mild cardiomegaly. 4. Atherosclerosis. 5. Mild calcifications of the aortic valve. 6. Postoperative and post procedural changes in the upper abdomen, as above.   Electronically Signed   By: Vinnie Langton M.D.   On: 03/03/2015 16:41    Medications: I have reviewed the patient's current medications.  Assessment/Plan: 1. 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  Loss of MLH1 and PMS2 expression  Microsatellite instability-high  CT chest 03/03/2015-negative from estate disease  2.  Anemia  3. Left renal mass  suspicious for a renal cell carcinoma  4. Diabetes  5. Hypertension    Disposition:  Cheryl Jordan appears stable. She would like to wait a few more weeks prior to beginning adjuvant capecitabine. She attended a chemotherapy teaching class and we reviewed the potential toxicities associated with capecitabine again today. She will return for an office visit 03/17/2015 with the plan to begin adjuvant therapy that day.  She will continue follow-up with Dr. Alyson Ingles for management of the left renal mass.  We are waiting on BRAF testing. 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 We will proceed with additional testing if the BRAF returns negative.  [name], MD  03/03/2015  4:52 PM

## 2015-03-03 NOTE — Telephone Encounter (Signed)
Pt confirmed labs/ov per 08/18 POF, gave pt AVS and Calendar... KJ

## 2015-03-03 NOTE — Progress Notes (Signed)
Oncology Nurse Navigator Documentation  Oncology Nurse Navigator Flowsheets 03/03/2015  Referral date to RadOnc/MedOnc -  Navigator Encounter Type Initial MedOnc  Patient Visit Type Medonc  Treatment Phase Treatment planning-Xeloda  Barriers/Navigation Needs Family concerns--appetite  Interventions Referrals  Referrals Nutrition/dietician  Time Spent with Patient 30  Met with patient and husband for the first time and informed them of my role as GI Navigator. Reviewed Xeloda information and consent signed. Main worry is of her mild nausea and poor appetite. Will have dietician see her at next appointment. She reports liking supplements Breeze and Ensure Clear. Does not know where to find these. She is concerned about what her co pay will be for Xeloda. We will let her know before she needs to pick it up. Reviewed her lab work with her and provided copies.

## 2015-03-04 ENCOUNTER — Other Ambulatory Visit: Payer: Self-pay | Admitting: *Deleted

## 2015-03-04 ENCOUNTER — Telehealth: Payer: Self-pay | Admitting: Nurse Practitioner

## 2015-03-04 MED ORDER — CAPECITABINE 500 MG PO TABS: 500.0000 mg | ORAL_TABLET | Freq: Two times a day (BID) | ORAL | Status: DC

## 2015-03-04 NOTE — Telephone Encounter (Signed)
Lft msg for pt confirming NUT class for 08/31, there was no 09/01 availability. Advised pt if she can't do this to call to r/s, mailed out schedule.Marland Kitchen KJ

## 2015-03-09 ENCOUNTER — Encounter: Payer: Self-pay | Admitting: *Deleted

## 2015-03-09 ENCOUNTER — Encounter (HOSPITAL_COMMUNITY): Payer: Self-pay

## 2015-03-09 NOTE — [facility] Oncology Navigator Note (Signed)
F/U with [place] regarding status of Xeloda. Co pay is $401.96. Currently no co pay assist is available for her diagnosis. Will continue to check on [facility] in next week. Patient is aware and plans to pick up medication next week when here for MD visit. Pharmacist seems to think she may pay the co pay for now. Did not want IV chemo.

## 2015-03-15 ENCOUNTER — Telehealth: Payer: Self-pay | Admitting: Oncology

## 2015-03-15 NOTE — Telephone Encounter (Signed)
Returned [employer] account executive. Patient moved Nutrition appointment from 08/31 to 09/09 patient confirmed.

## 2015-03-16 ENCOUNTER — Encounter: Payer: Medicare Other | Admitting: Nutrition

## 2015-03-17 ENCOUNTER — Ambulatory Visit (HOSPITAL_BASED_OUTPATIENT_CLINIC_OR_DEPARTMENT_OTHER): Payer: Medicare Other | Admitting: Nurse Practitioner

## 2015-03-17 ENCOUNTER — Telehealth: Payer: Self-pay | Admitting: Oncology

## 2015-03-17 ENCOUNTER — Other Ambulatory Visit (HOSPITAL_BASED_OUTPATIENT_CLINIC_OR_DEPARTMENT_OTHER): Payer: Medicare Other

## 2015-03-17 VITALS — BP 158/67 | HR 70 | Temp 98.1°F | Resp 18 | Ht 68.0 in | Wt 200.8 lb

## 2015-03-17 DIAGNOSIS — C180 Malignant neoplasm of cecum: Secondary | ICD-10-CM

## 2015-03-17 DIAGNOSIS — D649 Anemia, unspecified: Secondary | ICD-10-CM

## 2015-03-17 DIAGNOSIS — C189 Malignant neoplasm of colon, unspecified: Secondary | ICD-10-CM

## 2015-03-17 DIAGNOSIS — N289 Disorder of kidney and ureter, unspecified: Secondary | ICD-10-CM | POA: Diagnosis not present

## 2015-03-17 DIAGNOSIS — C778 Secondary and unspecified malignant neoplasm of lymph nodes of multiple regions: Secondary | ICD-10-CM

## 2015-03-17 LAB — COMPREHENSIVE METABOLIC PANEL (CC13)
ALBUMIN: 3.4 g/dL — AB (ref 3.5–5.0)
ALK PHOS: 87 U/L (ref 40–150)
ALT: 16 U/L (ref 0–55)
ANION GAP: 7 meq/L (ref 3–11)
AST: 28 U/L (ref 5–34)
BUN: 11.5 mg/dL (ref 7.0–26.0)
CALCIUM: 9.3 mg/dL (ref 8.4–10.4)
CHLORIDE: 109 meq/L (ref 98–109)
CO2: 26 mEq/L (ref 22–29)
CREATININE: 1 mg/dL (ref 0.6–1.1)
EGFR: 54 mL/min/{1.73_m2} — ABNORMAL LOW (ref >90)
Glucose: 145 mg/dl — ABNORMAL HIGH (ref 70–140)
POTASSIUM: 4.4 meq/L (ref 3.5–5.1)
Sodium: 142 mEq/L (ref 136–145)
Total Bilirubin: 0.46 mg/dL (ref 0.20–1.20)
Total Protein: 6.5 g/dL (ref 6.4–8.3)

## 2015-03-17 LAB — CBC WITH DIFFERENTIAL/PLATELET
BASO%: 0.6 % (ref 0.0–2.0)
BASOS ABS: 0 10*3/uL (ref 0.0–0.1)
EOS%: 1.5 % (ref 0.0–7.0)
Eosinophils Absolute: 0.1 10*3/uL (ref 0.0–0.5)
HEMATOCRIT: 28.1 % — AB (ref 34.8–46.6)
HEMOGLOBIN: 8.6 g/dL — AB (ref 11.6–15.9)
LYMPH#: 2.2 10*3/uL (ref 0.9–3.3)
LYMPH%: 38.1 % (ref 14.0–49.7)
MCH: 24.1 pg — AB (ref 25.1–34.0)
MCHC: 30.7 g/dL — AB (ref 31.5–36.0)
MCV: 78.4 fL — ABNORMAL LOW (ref 79.5–101.0)
MONO#: 0.4 10*3/uL (ref 0.1–0.9)
MONO%: 6.2 % (ref 0.0–14.0)
NEUT#: 3.1 10*3/uL (ref 1.5–6.5)
NEUT%: 53.6 % (ref 38.4–76.8)
PLATELETS: 173 10*3/uL (ref 145–400)
RBC: 3.59 10*6/uL — ABNORMAL LOW (ref 3.70–5.45)
RDW: 15.6 % — ABNORMAL HIGH (ref 11.2–14.5)
WBC: 5.8 10*3/uL (ref 3.9–10.3)

## 2015-03-17 NOTE — Telephone Encounter (Signed)
Gave and printed appt sched and avs for pt for Sept °

## 2015-03-17 NOTE — Progress Notes (Signed)
  [facility] OFFICE PROGRESS NOTE   Diagnosis:  Colon cancer  INTERVAL HISTORY:   Cheryl Jordan returns as scheduled. She denies nausea/vomiting. No mouth sores. Bowels moving regularly. No bloody or black stools. No abdominal pain. No rash.  Objective:  Vital signs in last 24 hours:  Blood pressure 158/67, pulse 70, temperature 98.1 F (36.7 C), temperature source Oral, resp. rate 18, height $RemoveBe'5\' 8"'sDXZzIxCe$  (1.727 m), weight 200 lb 12.8 oz (91.082 kg), SpO2 98 %.    HEENT: No thrush or ulcers. Resp: Lungs clear bilaterally. Cardio: Regular rate and rhythm. GI: Abdomen soft and nontender. No hepatomegaly. Well-healed midline incision. Vascular: No leg edema.   Lab Results:  Lab Results  Component Value Date   WBC 5.8 03/17/2015   HGB 8.6* 03/17/2015   HCT 28.1* 03/17/2015   MCV 78.4* 03/17/2015   PLT 173 03/17/2015   NEUTROABS 3.1 03/17/2015    Imaging:  No results found.  Medications: I have reviewed the patient's current medications.  Assessment/Plan: 1. 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  Loss of MLH1 and PMS2 expression  Microsatellite instability-high  CT chest 03/03/2015-negative for metastatic disease  Cycle 1 adjuvant Xeloda 03/18/2015  2. Anemia 3. 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 Diabetes 5. Hypertension    Disposition: Cheryl Jordan appears stable. She will begin cycle 1 adjuvant Xeloda on 03/18/2015.  She has a microcytic anemia. Likely iron deficiency related to previous GI blood loss. She will begin ferrous sulfate 3 times daily.  She will return for a follow-up visit in 3 weeks.  Plan reviewed with Dr. Benay Spice.  Cheryl Jordan ANP/GNP-BC    03/17/2015  11:41 AM

## 2015-03-17 NOTE — Patient Instructions (Signed)
Begin ferrous sulfate 325 mg three times a day

## 2015-03-25 ENCOUNTER — Encounter: Payer: Medicare Other | Admitting: Nutrition

## 2015-04-08 ENCOUNTER — Telehealth: Payer: Self-pay | Admitting: Oncology

## 2015-04-08 ENCOUNTER — Ambulatory Visit (HOSPITAL_BASED_OUTPATIENT_CLINIC_OR_DEPARTMENT_OTHER): Payer: Medicare Other | Admitting: Nurse Practitioner

## 2015-04-08 ENCOUNTER — Other Ambulatory Visit (HOSPITAL_BASED_OUTPATIENT_CLINIC_OR_DEPARTMENT_OTHER): Payer: Medicare Other

## 2015-04-08 VITALS — BP 156/70 | HR 69 | Temp 98.2°F | Resp 16 | Ht 68.0 in | Wt 207.0 lb

## 2015-04-08 DIAGNOSIS — C189 Malignant neoplasm of colon, unspecified: Secondary | ICD-10-CM | POA: Diagnosis not present

## 2015-04-08 LAB — COMPREHENSIVE METABOLIC PANEL (CC13)
ALBUMIN: 3.6 g/dL (ref 3.5–5.0)
ALK PHOS: 82 U/L (ref 40–150)
ALT: 17 U/L (ref 0–55)
AST: 29 U/L (ref 5–34)
Anion Gap: 8 mEq/L (ref 3–11)
BUN: 14.1 mg/dL (ref 7.0–26.0)
CO2: 24 meq/L (ref 22–29)
Calcium: 9 mg/dL (ref 8.4–10.4)
Chloride: 111 mEq/L — ABNORMAL HIGH (ref 98–109)
Creatinine: 1.4 mg/dL — ABNORMAL HIGH (ref 0.6–1.1)
EGFR: 37 mL/min/{1.73_m2} — AB (ref >90)
GLUCOSE: 110 mg/dL (ref 70–140)
POTASSIUM: 4.4 meq/L (ref 3.5–5.1)
SODIUM: 143 meq/L (ref 136–145)
Total Bilirubin: 0.36 mg/dL (ref 0.20–1.20)
Total Protein: 6.5 g/dL (ref 6.4–8.3)

## 2015-04-08 LAB — CBC WITH DIFFERENTIAL/PLATELET
BASO%: 0.4 % (ref 0.0–2.0)
BASOS ABS: 0 10*3/uL (ref 0.0–0.1)
EOS ABS: 0.1 10*3/uL (ref 0.0–0.5)
EOS%: 1.6 % (ref 0.0–7.0)
HCT: 31.4 % — ABNORMAL LOW (ref 34.8–46.6)
HGB: 9.7 g/dL — ABNORMAL LOW (ref 11.6–15.9)
LYMPH%: 44.2 % (ref 14.0–49.7)
MCH: 26.7 pg (ref 25.1–34.0)
MCHC: 31 g/dL — ABNORMAL LOW (ref 31.5–36.0)
MCV: 86.1 fL (ref 79.5–101.0)
MONO#: 0.4 10*3/uL (ref 0.1–0.9)
MONO%: 6.8 % (ref 0.0–14.0)
NEUT#: 2.7 10*3/uL (ref 1.5–6.5)
NEUT%: 47 % (ref 38.4–76.8)
Platelets: 188 10*3/uL (ref 145–400)
RBC: 3.64 10*6/uL — AB (ref 3.70–5.45)
RDW: 28.8 % — ABNORMAL HIGH (ref 11.2–14.5)
WBC: 5.8 10*3/uL (ref 3.9–10.3)
lymph#: 2.6 10*3/uL (ref 0.9–3.3)

## 2015-04-08 LAB — FERRITIN CHCC: Ferritin: 27 ng/ml (ref 9–269)

## 2015-04-08 NOTE — Progress Notes (Signed)
  [place] OFFICE PROGRESS NOTE   Diagnosis:  Colon cancer  INTERVAL HISTORY:   Cheryl Jordan returns as scheduled. She completed cycle 1 adjuvant Xeloda beginning 03/18/2015. She denies nausea/vomiting. No mouth sores. No diarrhea. No hand or foot pain or redness. She has noted some skin changes over the forearms. She was recently treated for urinary tract infection.  Objective:  Vital signs in last 24 hours:  Blood pressure 156/70, pulse 69, temperature 98.2 F (36.8 C), temperature source Oral, resp. rate 16, height $RemoveBe'5\' 8"'wPJoOTbKk$  (1.727 m), weight 207 lb (93.895 kg), SpO2 97 %.    HEENT: No thrush or ulcers. Resp: Lungs clear bilaterally. Cardio: Regular rate and rhythm. GI: Abdomen soft and nontender. No hepatomegaly. Vascular: Trace lower leg/ankle edema bilaterally. Skin: Dry skin noted over the forearms.    Lab Results:  Lab Results  Component Value Date   WBC 5.8 04/08/2015   HGB 9.7* 04/08/2015   HCT 31.4* 04/08/2015   MCV 86.1 04/08/2015   PLT 188 04/08/2015   NEUTROABS 2.7 04/08/2015    Imaging:  No results found.  Medications: I have reviewed the patient's current medications.  Assessment/Plan: 1. 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  Loss of MLH1 and PMS2 expression  Microsatellite instability-high  CT chest 03/03/2015-negative for metastatic disease  Cycle 1 adjuvant Xeloda 03/18/2015  Cycle 2 adjuvant Xeloda 04/09/2015  2. Anemia, microcytic. Likely iron deficiency from previous GI blood loss. Oral iron initiated 03/17/2015. Improved 04/08/2015. 3. 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 Diabetes 5. Hypertension   Disposition: Cheryl Jordan appears  stable. She has completed 1 cycle of adjuvant Xeloda. Plan to proceed with cycle 2 beginning 04/09/2015.  The anemia has improved since beginning oral iron. She will continue the same.  She will return for a follow-up visit in approximately 3 weeks. She will contact the office in the interim with any problems.    Ned Card ANP/GNP-BC   04/08/2015  2:19 PM

## 2015-04-08 NOTE — Telephone Encounter (Signed)
Gave patient avs report and appointments for October and November.  °

## 2015-04-28 ENCOUNTER — Encounter: Payer: Self-pay | Admitting: *Deleted

## 2015-04-28 ENCOUNTER — Telehealth: Payer: Self-pay | Admitting: Nurse Practitioner

## 2015-04-28 ENCOUNTER — Other Ambulatory Visit: Payer: Self-pay | Admitting: Oncology

## 2015-04-28 ENCOUNTER — Other Ambulatory Visit (HOSPITAL_BASED_OUTPATIENT_CLINIC_OR_DEPARTMENT_OTHER): Payer: Medicare Other

## 2015-04-28 ENCOUNTER — Ambulatory Visit (HOSPITAL_BASED_OUTPATIENT_CLINIC_OR_DEPARTMENT_OTHER): Payer: Medicare Other | Admitting: Nurse Practitioner

## 2015-04-28 VITALS — BP 159/59 | HR 68 | Temp 98.0°F | Resp 18 | Ht 68.0 in | Wt 206.8 lb

## 2015-04-28 DIAGNOSIS — C778 Secondary and unspecified malignant neoplasm of lymph nodes of multiple regions: Secondary | ICD-10-CM

## 2015-04-28 DIAGNOSIS — C180 Malignant neoplasm of cecum: Secondary | ICD-10-CM | POA: Diagnosis not present

## 2015-04-28 DIAGNOSIS — C189 Malignant neoplasm of colon, unspecified: Secondary | ICD-10-CM

## 2015-04-28 DIAGNOSIS — D649 Anemia, unspecified: Secondary | ICD-10-CM | POA: Diagnosis not present

## 2015-04-28 DIAGNOSIS — N289 Disorder of kidney and ureter, unspecified: Secondary | ICD-10-CM

## 2015-04-28 LAB — CBC WITH DIFFERENTIAL/PLATELET
BASO%: 0.3 % (ref 0.0–2.0)
BASOS ABS: 0 10*3/uL (ref 0.0–0.1)
EOS%: 6 % (ref 0.0–7.0)
Eosinophils Absolute: 0.4 10*3/uL (ref 0.0–0.5)
HEMATOCRIT: 32.7 % — AB (ref 34.8–46.6)
HEMOGLOBIN: 10.3 g/dL — AB (ref 11.6–15.9)
LYMPH#: 3 10*3/uL (ref 0.9–3.3)
LYMPH%: 47.6 % (ref 14.0–49.7)
MCH: 29.1 pg (ref 25.1–34.0)
MCHC: 31.5 g/dL (ref 31.5–36.0)
MCV: 92.4 fL (ref 79.5–101.0)
MONO#: 0.4 10*3/uL (ref 0.1–0.9)
MONO%: 6 % (ref 0.0–14.0)
NEUT%: 40.1 % (ref 38.4–76.8)
NEUTROS ABS: 2.6 10*3/uL (ref 1.5–6.5)
Platelets: 156 10*3/uL (ref 145–400)
RBC: 3.54 10*6/uL — ABNORMAL LOW (ref 3.70–5.45)
RDW: 28.6 % — AB (ref 11.2–14.5)
WBC: 6.4 10*3/uL (ref 3.9–10.3)

## 2015-04-28 LAB — COMPREHENSIVE METABOLIC PANEL (CC13)
ALBUMIN: 3.6 g/dL (ref 3.5–5.0)
ALK PHOS: 90 U/L (ref 40–150)
ALT: 20 U/L (ref 0–55)
AST: 35 U/L — AB (ref 5–34)
Anion Gap: 6 mEq/L (ref 3–11)
BUN: 17 mg/dL (ref 7.0–26.0)
CALCIUM: 9.3 mg/dL (ref 8.4–10.4)
CO2: 26 mEq/L (ref 22–29)
CREATININE: 1.1 mg/dL (ref 0.6–1.1)
Chloride: 111 mEq/L — ABNORMAL HIGH (ref 98–109)
EGFR: 48 mL/min/{1.73_m2} — ABNORMAL LOW (ref >90)
GLUCOSE: 107 mg/dL (ref 70–140)
POTASSIUM: 4.3 meq/L (ref 3.5–5.1)
SODIUM: 143 meq/L (ref 136–145)
TOTAL PROTEIN: 6.7 g/dL (ref 6.4–8.3)
Total Bilirubin: 0.52 mg/dL (ref 0.20–1.20)

## 2015-04-28 NOTE — Progress Notes (Signed)
  [place] OFFICE PROGRESS NOTE   Diagnosis:  Colon cancer  INTERVAL HISTORY:   Cheryl Jordan returns as scheduled. She completed cycle 2 adjuvant Xeloda beginning 04/09/2015. She denies nausea/vomiting. No mouth sores. No diarrhea. Last week the soles of her feet became "sore and red". This resolved over about 3 days. She continues to note skin lesions over the forearms and more recently the lower legs. Some of the lesions are "itchy".  Objective:  Vital signs in last 24 hours:  Blood pressure 159/59, pulse 68, temperature 98 F (36.7 C), temperature source Oral, resp. rate 18, height _0  (1.727 m), weight 206 lb 12.8 oz (93.804 kg), SpO2 98 %.    HEENT: No thrush or ulcers. Resp: Lungs clear bilaterally. Cardio: Regular rate and rhythm. GI: Abdomen soft and nontender. Hepatomegaly. Vascular: No leg edema. Skin: Palms and soles without erythema. No skin breakdown. Small flat dry appearing erythematous lesions scattered over the forearms and lower legs.    Lab Results:  Lab Results  Component Value Date   WBC 6.4 04/28/2015   HGB 10.3* 04/28/2015   HCT 32.7* 04/28/2015   MCV 92.4 04/28/2015   PLT 156 04/28/2015   NEUTROABS 2.6 04/28/2015    Imaging:  No results found.  Medications: I have reviewed the patient's current medications.  Assessment/Plan: 1. 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  Loss of MLH1 and PMS2 expression  Microsatellite instability-high  CT chest 03/03/2015-negative for metastatic disease  Cycle 1 adjuvant Xeloda 03/18/2015  Cycle 2 adjuvant Xeloda 04/09/2015  Cycle 3 adjuvant Xeloda 04/30/2015  2. Anemia, microcytic. Likely iron deficiency from previous GI  blood loss. Oral iron initiated 03/17/2015. Improved. 3. 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 Diabetes 5. Hypertension    Disposition: Cheryl Jordan appears stable. She has completed 2 cycles of adjuvant Xeloda. Plan to proceed with cycle 3 beginning 04/30/2015.  The erythematous dry appearing rash over the forearms and lower legs is most likely related to Xeloda. She will continue a moisturizing lotion. If she has pruritus she will try topical over-the-counter hydrocortisone cream.  She will return for a follow-up visit on 05/19/2015. She will contact the office in the interim with any problems. We specifically discussed symptoms of hand-foot syndrome.  Ned Card ANP/GNP-BC   04/28/2015  2:23 PM

## 2015-04-28 NOTE — Telephone Encounter (Signed)
per pof to sch pt appt-gave pt copy of avs °

## 2015-04-28 NOTE — Progress Notes (Signed)
Oncology Nurse Navigator Documentation  Oncology Nurse Navigator Flowsheets 04/28/2015  Referral date to RadOnc/MedOnc -  Navigator Encounter Type 3 month  Patient Visit Type Medonc  Treatment Phase Treatment-cycle #3 Xeloda due to start  Barriers/Navigation Needs Financial-copay for drug is $401  Interventions Left message for Oral Chemo Program pharmacist to research assistance for her  Referrals -  Time Spent with Patient 15  Minimal adverse effect from the Xeloda. Has dry, itchy rash on arms and legs.

## 2015-05-02 ENCOUNTER — Encounter: Payer: Self-pay | Admitting: Oncology

## 2015-05-02 NOTE — Progress Notes (Signed)
I called [employer] and no funds available for asst with Xeloda. I left a message for Cheryl Jordan with [employer] to see if she has any funds to help.

## 2015-05-09 ENCOUNTER — Encounter: Payer: Self-pay | Admitting: Oncology

## 2015-05-09 NOTE — Progress Notes (Signed)
I left message on vmail for Cheryl Jordan at [employer] to see if funds for xeloda asst.

## 2015-05-11 ENCOUNTER — Encounter: Payer: Self-pay | Admitting: Oncology

## 2015-05-11 ENCOUNTER — Other Ambulatory Visit: Payer: Self-pay | Admitting: *Deleted

## 2015-05-11 MED ORDER — CAPECITABINE 500 MG PO TABS: ORAL_TABLET | ORAL | Status: DC

## 2015-05-11 NOTE — Progress Notes (Signed)
I tried to call the patient to get income/# of folks in household but no answer. I will send to [employer] once all of application is filled out.

## 2015-05-11 NOTE — Progress Notes (Signed)
I left message for nurse. Per dorrie with [employer] there maybe funds for xeloda. She needs script with diag and number of refills along with application.

## 2015-05-19 ENCOUNTER — Ambulatory Visit (HOSPITAL_BASED_OUTPATIENT_CLINIC_OR_DEPARTMENT_OTHER): Payer: Medicare Other | Admitting: Oncology

## 2015-05-19 ENCOUNTER — Other Ambulatory Visit (HOSPITAL_BASED_OUTPATIENT_CLINIC_OR_DEPARTMENT_OTHER): Payer: Medicare Other

## 2015-05-19 ENCOUNTER — Encounter: Payer: Self-pay | Admitting: Oncology

## 2015-05-19 ENCOUNTER — Other Ambulatory Visit: Payer: Self-pay | Admitting: *Deleted

## 2015-05-19 ENCOUNTER — Telehealth: Payer: Self-pay | Admitting: Oncology

## 2015-05-19 VITALS — BP 158/58 | HR 77 | Temp 98.0°F | Resp 20 | Ht 68.0 in | Wt 208.6 lb

## 2015-05-19 DIAGNOSIS — C778 Secondary and unspecified malignant neoplasm of lymph nodes of multiple regions: Secondary | ICD-10-CM

## 2015-05-19 DIAGNOSIS — C189 Malignant neoplasm of colon, unspecified: Secondary | ICD-10-CM

## 2015-05-19 DIAGNOSIS — N289 Disorder of kidney and ureter, unspecified: Secondary | ICD-10-CM

## 2015-05-19 DIAGNOSIS — C180 Malignant neoplasm of cecum: Secondary | ICD-10-CM | POA: Diagnosis not present

## 2015-05-19 DIAGNOSIS — D649 Anemia, unspecified: Secondary | ICD-10-CM

## 2015-05-19 LAB — COMPREHENSIVE METABOLIC PANEL (CC13)
ALBUMIN: 3.6 g/dL (ref 3.5–5.0)
ALK PHOS: 84 U/L (ref 40–150)
ALT: 20 U/L (ref 0–55)
ANION GAP: 5 meq/L (ref 3–11)
AST: 30 U/L (ref 5–34)
BILIRUBIN TOTAL: 0.58 mg/dL (ref 0.20–1.20)
BUN: 15.1 mg/dL (ref 7.0–26.0)
CALCIUM: 9.6 mg/dL (ref 8.4–10.4)
CO2: 26 mEq/L (ref 22–29)
Chloride: 110 mEq/L — ABNORMAL HIGH (ref 98–109)
Creatinine: 1.1 mg/dL (ref 0.6–1.1)
EGFR: 48 mL/min/{1.73_m2} — AB (ref >90)
Glucose: 153 mg/dl — ABNORMAL HIGH (ref 70–140)
Potassium: 4.4 mEq/L (ref 3.5–5.1)
Sodium: 142 mEq/L (ref 136–145)
TOTAL PROTEIN: 6.4 g/dL (ref 6.4–8.3)

## 2015-05-19 LAB — CBC WITH DIFFERENTIAL/PLATELET
BASO%: 0.3 % (ref 0.0–2.0)
Basophils Absolute: 0 10*3/uL (ref 0.0–0.1)
EOS ABS: 0.6 10*3/uL — AB (ref 0.0–0.5)
EOS%: 9 % — ABNORMAL HIGH (ref 0.0–7.0)
HEMATOCRIT: 33.4 % — AB (ref 34.8–46.6)
HEMOGLOBIN: 10.8 g/dL — AB (ref 11.6–15.9)
LYMPH%: 41.1 % (ref 14.0–49.7)
MCH: 31 pg (ref 25.1–34.0)
MCHC: 32.5 g/dL (ref 31.5–36.0)
MCV: 95.4 fL (ref 79.5–101.0)
MONO#: 0.3 10*3/uL (ref 0.1–0.9)
MONO%: 5.4 % (ref 0.0–14.0)
NEUT%: 44.2 % (ref 38.4–76.8)
NEUTROS ABS: 2.8 10*3/uL (ref 1.5–6.5)
PLATELETS: 147 10*3/uL (ref 145–400)
RBC: 3.5 10*6/uL — AB (ref 3.70–5.45)
RDW: 33.9 % — ABNORMAL HIGH (ref 11.2–14.5)
WBC: 6.2 10*3/uL (ref 3.9–10.3)
lymph#: 2.6 10*3/uL (ref 0.9–3.3)

## 2015-05-19 MED ORDER — CAPECITABINE 500 MG PO TABS: ORAL_TABLET | ORAL | Status: DC

## 2015-05-19 NOTE — Telephone Encounter (Signed)
Pt escorted to Raquel's office to complete co-pay assistance application. Xeloda Rx sent to [facility] for 11/26 cycle.

## 2015-05-19 NOTE — Telephone Encounter (Signed)
Gave adn pritned appt sched and avs for pt for NOV and DEC

## 2015-05-19 NOTE — Progress Notes (Signed)
  [place] OFFICE PROGRESS NOTE   Diagnosis: Colon cancer  INTERVAL HISTORY:   Cheryl Jordan returns as scheduled. She began another cycle of Xeloda on 04/30/2015. No mouth sores or diarrhea. She reports transient soreness at the palms and soles. She continues to have an erythematous rash over the lower arms and lower legs.  Objective:  Vital signs in last 24 hours:  Blood pressure 158/58, pulse 77, temperature 98 F (36.7 C), resp. rate 20, height $RemoveBe'5\' 8"'qaMswFRLm$  (1.727 m), weight 208 lb 9.6 oz (94.62 kg), SpO2 98 %.    HEENT: No thrush or ulcers Resp: Lungs clear bilaterally Cardio: Regular rate and rhythm GI: No hepatomegaly, nontender Vascular: No leg edema  Skin: Mild hyperpigmentation of the palms and soles, no skin breakdown   Portacath/PICC-without erythema  Lab Results:  Lab Results  Component Value Date   WBC 6.2 05/19/2015   HGB 10.8* 05/19/2015   HCT 33.4* 05/19/2015   MCV 95.4 05/19/2015   PLT 147 05/19/2015   NEUTROABS 2.8 05/19/2015      Lab Results  Component Value Date   CEA 3.5 02/24/2015    Imaging:  No results found.  Medications: I have reviewed the patient's current medications.  Assessment/Plan: 1. 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  Loss of MLH1 and PMS2 expression  Microsatellite instability-high  CT chest 03/03/2015-negative for metastatic disease  Cycle 1 adjuvant Xeloda 03/18/2015  Cycle 2 adjuvant Xeloda 04/09/2015  Cycle 3 adjuvant Xeloda 04/30/2015  Cycle 4 adjuvant Xeloda 05/21/2015  2. Anemia, microcytic. Likely iron deficiency from previous GI blood loss. Oral iron initiated 03/17/2015. Improved. 3. Left renal mass suspicious for a renal  cell carcinoma 4. Diabetes 5. Hypertension    Disposition:  Cheryl Jordan appears unchanged. She has completed 3 cycles of adjuvant Xeloda. The plan is to proceed with cycle 4 on 05/21/2015. She will return for an office visit on 06/07/2015.  [name], MD  05/19/2015  10:51 AM

## 2015-05-19 NOTE — Progress Notes (Signed)
I faxed [employer] form for possible xeloda asst  [address]

## 2015-05-20 ENCOUNTER — Encounter: Payer: Self-pay | Admitting: Oncology

## 2015-05-20 NOTE — Progress Notes (Signed)
Per Dorrie at [employer], there are no funds left for Xeloda asst. I will let the dr,nurse and Gerald Stabs know.

## 2015-06-07 ENCOUNTER — Other Ambulatory Visit (HOSPITAL_BASED_OUTPATIENT_CLINIC_OR_DEPARTMENT_OTHER): Payer: Medicare Other

## 2015-06-07 ENCOUNTER — Encounter: Payer: Self-pay | Admitting: Oncology

## 2015-06-07 ENCOUNTER — Ambulatory Visit (HOSPITAL_BASED_OUTPATIENT_CLINIC_OR_DEPARTMENT_OTHER): Payer: Medicare Other | Admitting: Oncology

## 2015-06-07 VITALS — BP 119/69 | HR 64 | Temp 98.5°F | Resp 18 | Ht 68.0 in | Wt 177.6 lb

## 2015-06-07 DIAGNOSIS — L271 Localized skin eruption due to drugs and medicaments taken internally: Secondary | ICD-10-CM | POA: Diagnosis not present

## 2015-06-07 DIAGNOSIS — C180 Malignant neoplasm of cecum: Secondary | ICD-10-CM

## 2015-06-07 DIAGNOSIS — C189 Malignant neoplasm of colon, unspecified: Secondary | ICD-10-CM

## 2015-06-07 LAB — COMPREHENSIVE METABOLIC PANEL (CC13)
ALBUMIN: 3.5 g/dL (ref 3.5–5.0)
ALK PHOS: 92 U/L (ref 40–150)
ALT: 20 U/L (ref 0–55)
AST: 32 U/L (ref 5–34)
Anion Gap: 9 mEq/L (ref 3–11)
BUN: 16.8 mg/dL (ref 7.0–26.0)
CHLORIDE: 112 meq/L — AB (ref 98–109)
CO2: 22 meq/L (ref 22–29)
Calcium: 9.5 mg/dL (ref 8.4–10.4)
Creatinine: 1.3 mg/dL — ABNORMAL HIGH (ref 0.6–1.1)
EGFR: 41 mL/min/{1.73_m2} — AB (ref >90)
GLUCOSE: 143 mg/dL — AB (ref 70–140)
POTASSIUM: 4.3 meq/L (ref 3.5–5.1)
SODIUM: 143 meq/L (ref 136–145)
Total Bilirubin: 0.65 mg/dL (ref 0.20–1.20)
Total Protein: 6.4 g/dL (ref 6.4–8.3)

## 2015-06-07 LAB — CBC WITH DIFFERENTIAL/PLATELET
BASO%: 0.3 % (ref 0.0–2.0)
Basophils Absolute: 0 10*3/uL (ref 0.0–0.1)
EOS%: 4.9 % (ref 0.0–7.0)
Eosinophils Absolute: 0.3 10*3/uL (ref 0.0–0.5)
HCT: 33.7 % — ABNORMAL LOW (ref 34.8–46.6)
HGB: 11.2 g/dL — ABNORMAL LOW (ref 11.6–15.9)
LYMPH#: 2.7 10*3/uL (ref 0.9–3.3)
LYMPH%: 40.1 % (ref 14.0–49.7)
MCH: 32.7 pg (ref 25.1–34.0)
MCHC: 33.2 g/dL (ref 31.5–36.0)
MCV: 98.5 fL (ref 79.5–101.0)
MONO#: 0.4 10*3/uL (ref 0.1–0.9)
MONO%: 5.2 % (ref 0.0–14.0)
NEUT#: 3.3 10*3/uL (ref 1.5–6.5)
NEUT%: 49.5 % (ref 38.4–76.8)
Platelets: 143 10*3/uL — ABNORMAL LOW (ref 145–400)
RBC: 3.42 10*6/uL — AB (ref 3.70–5.45)
RDW: 27.4 % — AB (ref 11.2–14.5)
WBC: 6.7 10*3/uL (ref 3.9–10.3)

## 2015-06-07 MED ORDER — NYSTATIN 100000 UNIT/GM EX CREA: 1.0000 "application " | TOPICAL_CREAM | Freq: Two times a day (BID) | CUTANEOUS | Status: DC

## 2015-06-07 MED ORDER — CAPECITABINE 500 MG PO TABS: ORAL_TABLET | ORAL | Status: DC

## 2015-06-07 NOTE — Progress Notes (Signed)
  [place] OFFICE PROGRESS NOTE   Diagnosis: Colon cancer  INTERVAL HISTORY:   Cheryl Jordan returns as scheduled. She began another cycle of Xeloda on 05/21/2015. No mouth sores or diarrhea. She reports transient soreness at the soles of her feet. States that skin is now peeling and she has developed a blister of her left small toe.. She continues to have an erythematous rash over the lower arms and lower legs. Rash is now also on the abdomen for the past week.  Objective:  Vital signs in last 24 hours:  Blood pressure 119/69, pulse 64, temperature 98.5 F (36.9 C), temperature source Oral, resp. rate 18, height $RemoveBe'5\' 8"'VaSiNjTIG$  (1.727 m), weight 177 lb 9.6 oz (80.559 kg), SpO2 100 %.    HEENT: No thrush or ulcers Resp: Lungs clear bilaterally Cardio: Regular rate and rhythm GI: No hepatomegaly, nontender Vascular: No leg edema  Skin: Mild hyperpigmentation of the palms and soles. Mild desquamation to soles of bilateral feet.    Portacath/PICC-without erythema  Lab Results:  Lab Results  Component Value Date   WBC 6.7 06/07/2015   HGB 11.2* 06/07/2015   HCT 33.7* 06/07/2015   MCV 98.5 06/07/2015   PLT 143* 06/07/2015   NEUTROABS 3.3 06/07/2015      Lab Results  Component Value Date   CEA 3.5 02/24/2015    Imaging:  No results found.  Medications: I have reviewed the patient's current medications.  Assessment/Plan: 1. 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  Loss of MLH1 and PMS2 expression  Microsatellite instability-high  CT chest 03/03/2015-negative for metastatic disease  Cycle 1 adjuvant Xeloda 03/18/2015  Cycle 2 adjuvant Xeloda 04/09/2015  Cycle 3 adjuvant Xeloda  04/30/2015  Cycle 4 adjuvant Xeloda 05/21/2015  Cycle 5 adjuvant Xeloda 06/11/2015 (dose reduced due to desquamation of feet).  2. Anemia, microcytic. Likely iron deficiency from previous GI blood loss. Oral iron initiated 03/17/2015. Improved. 3. 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 Diabetes 5. Hypertension    Disposition:  Cheryl Jordan is tolerating the Xeloda well with the exception of mild desquamation of the sole of her feet. She has completed 4 cycles of adjuvant Xeloda. The plan is to proceed with cycle 4 on 06/11/2015, but will decrease the dose of Xeloda to 1500 mg in the morning and 1000 mg in the evening. A new prescription was sent to her pharmacy. She will return for an office visit on 06/29/15.  The patient was instructed to call for questions or concerns prior to the next visit.  The patient was seen and examined with Dr. Benay Spice.   Mikey Bussing, DNP, AGPCNP-BC, AOCNP  06/07/2015  9:07 PM   This was a shared visit with Altamese Dilling.  Cheryl Jordan was interviewed and examined.  She has mild hand-foot syndrome. Plan is to proceed with xeloda at a reduced dose.  Julieanne Manson, MD

## 2015-06-10 ENCOUNTER — Telehealth: Payer: Self-pay | Admitting: *Deleted

## 2015-06-10 ENCOUNTER — Telehealth: Payer: Self-pay | Admitting: Oncology

## 2015-06-10 NOTE — Telephone Encounter (Signed)
"  I missed a call from there."  This nurse asked if patient has lost weight recently.  "No I've gained weight.  I weighed 208 lbs when I was there.  What do you have?"  Advised weight = 177 lbs per EMR.  "They must have written that down wrong.  I weighed 208 lbs when I was there."  Thanked her and will notify staff.

## 2015-06-10 NOTE — Telephone Encounter (Signed)
Left message for pt to call office. Weight discrepancy noticed after office visit with APP 11/22. Need to find out if she has in fact lost 30 lbs.

## 2015-06-10 NOTE — Telephone Encounter (Signed)
Spoke with patient re appointment for January 3rd.

## 2015-06-29 ENCOUNTER — Ambulatory Visit (HOSPITAL_BASED_OUTPATIENT_CLINIC_OR_DEPARTMENT_OTHER): Payer: Medicare Other | Admitting: Oncology

## 2015-06-29 ENCOUNTER — Other Ambulatory Visit (HOSPITAL_BASED_OUTPATIENT_CLINIC_OR_DEPARTMENT_OTHER): Payer: Medicare Other

## 2015-06-29 ENCOUNTER — Telehealth: Payer: Self-pay | Admitting: Oncology

## 2015-06-29 ENCOUNTER — Other Ambulatory Visit: Payer: Self-pay | Admitting: Oncology

## 2015-06-29 VITALS — BP 153/61 | HR 62 | Temp 98.1°F | Resp 18 | Ht 68.0 in | Wt 211.3 lb

## 2015-06-29 DIAGNOSIS — C180 Malignant neoplasm of cecum: Secondary | ICD-10-CM

## 2015-06-29 DIAGNOSIS — C189 Malignant neoplasm of colon, unspecified: Secondary | ICD-10-CM

## 2015-06-29 DIAGNOSIS — R234 Changes in skin texture: Secondary | ICD-10-CM | POA: Diagnosis not present

## 2015-06-29 DIAGNOSIS — D509 Iron deficiency anemia, unspecified: Secondary | ICD-10-CM | POA: Diagnosis not present

## 2015-06-29 DIAGNOSIS — N289 Disorder of kidney and ureter, unspecified: Secondary | ICD-10-CM

## 2015-06-29 LAB — CBC WITH DIFFERENTIAL/PLATELET
BASO%: 0.4 % (ref 0.0–2.0)
BASOS ABS: 0 10*3/uL (ref 0.0–0.1)
EOS ABS: 0.2 10*3/uL (ref 0.0–0.5)
EOS%: 3.2 % (ref 0.0–7.0)
HEMATOCRIT: 35.6 % (ref 34.8–46.6)
HEMOGLOBIN: 11.7 g/dL (ref 11.6–15.9)
LYMPH#: 2.4 10*3/uL (ref 0.9–3.3)
LYMPH%: 40.4 % (ref 14.0–49.7)
MCH: 34.4 pg — AB (ref 25.1–34.0)
MCHC: 32.9 g/dL (ref 31.5–36.0)
MCV: 104.6 fL — AB (ref 79.5–101.0)
MONO#: 0.3 10*3/uL (ref 0.1–0.9)
MONO%: 5.3 % (ref 0.0–14.0)
NEUT#: 3 10*3/uL (ref 1.5–6.5)
NEUT%: 50.7 % (ref 38.4–76.8)
Platelets: 123 10*3/uL — ABNORMAL LOW (ref 145–400)
RBC: 3.4 10*6/uL — ABNORMAL LOW (ref 3.70–5.45)
RDW: 26 % — AB (ref 11.2–14.5)
WBC: 6 10*3/uL (ref 3.9–10.3)

## 2015-06-29 LAB — COMPREHENSIVE METABOLIC PANEL
ALBUMIN: 3.6 g/dL (ref 3.5–5.0)
ALK PHOS: 82 U/L (ref 40–150)
ALT: 20 U/L (ref 0–55)
AST: 34 U/L (ref 5–34)
Anion Gap: 8 mEq/L (ref 3–11)
BUN: 16.3 mg/dL (ref 7.0–26.0)
CALCIUM: 9.2 mg/dL (ref 8.4–10.4)
CHLORIDE: 108 meq/L (ref 98–109)
CO2: 26 mEq/L (ref 22–29)
Creatinine: 1.2 mg/dL — ABNORMAL HIGH (ref 0.6–1.1)
EGFR: 46 mL/min/{1.73_m2} — AB (ref >90)
Glucose: 167 mg/dl — ABNORMAL HIGH (ref 70–140)
POTASSIUM: 4.6 meq/L (ref 3.5–5.1)
Sodium: 142 mEq/L (ref 136–145)
Total Bilirubin: 0.83 mg/dL (ref 0.20–1.20)
Total Protein: 6.6 g/dL (ref 6.4–8.3)

## 2015-06-29 MED ORDER — CAPECITABINE 500 MG PO TABS: ORAL_TABLET | ORAL | Status: DC

## 2015-06-29 NOTE — Telephone Encounter (Signed)
per pof to sch pt appt-gave pt copy of avs °

## 2015-06-29 NOTE — Progress Notes (Signed)
  [place] OFFICE PROGRESS NOTE   Diagnosis: Colon cancer  INTERVAL HISTORY:   Cheryl Jordan returns as scheduled. She completed cycle 5 adjuvant Xeloda beginning 06/11/2015. No mouth sores or diarrhea. She has developed superficial desquamation at the feet. No pain.  Objective:  Vital signs in last 24 hours:  Blood pressure 153/61, pulse 62, temperature 98.1 F (36.7 C), temperature source Oral, resp. rate 18, height '5\' 8"'$  (1.727 m), weight 211 lb 4.8 oz (95.845 kg), SpO2 97 %.    HEENT: No thrush or ulcers Resp: Lungs clear bilaterally Cardio: Regular rate and rhythm GI: No hepatomegaly, nontender Vascular: The left lower leg is slightly larger than the right side, no edema or erythema  Skin: Mild hyperpigmentation and erythema of the palms, mild erythema with superficial desquamation at the soles.  Lab Results:  Lab Results  Component Value Date   WBC 6.0 06/29/2015   HGB 11.7 06/29/2015   HCT 35.6 06/29/2015   MCV 104.6* 06/29/2015   PLT 123* 06/29/2015   NEUTROABS 3.0 06/29/2015     Medications: I have reviewed the patient's current medications.  Assessment/Plan: 1. 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  Loss of MLH1 and PMS2 expression  Microsatellite instability-high  CT chest 03/03/2015-negative for metastatic disease  Cycle 1 adjuvant Xeloda 03/18/2015  Cycle 2 adjuvant Xeloda 04/09/2015  Cycle 3 adjuvant Xeloda 04/30/2015  Cycle 4 adjuvant Xeloda 05/21/2015  Cycle 5 adjuvant Xeloda 06/11/2015 (dose reduced due to desquamation of feet).  Cycle 6 adjuvant Xeloda 07/02/2015  2. Anemia, microcytic. Likely iron deficiency from previous GI blood loss. Oral iron initiated  03/17/2015. Improved. 3. 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 Diabetes 5. Hypertension   Disposition:  Cheryl Jordan appears stable. She has superficial desquamation at the feet without pain or ulceration. The plan is to begin cycle 6 adjuvant Xeloda 07/02/2015. She will return for an office visit in 3 weeks. She will discontinue Xeloda and contact us if she develops hand or foot pain.  [name], MD  06/29/2015  10:22 AM

## 2015-07-19 ENCOUNTER — Other Ambulatory Visit (HOSPITAL_BASED_OUTPATIENT_CLINIC_OR_DEPARTMENT_OTHER): Payer: Medicare Other

## 2015-07-19 ENCOUNTER — Encounter: Payer: Self-pay | Admitting: Oncology

## 2015-07-19 ENCOUNTER — Ambulatory Visit (HOSPITAL_BASED_OUTPATIENT_CLINIC_OR_DEPARTMENT_OTHER): Payer: Medicare Other | Admitting: Oncology

## 2015-07-19 VITALS — BP 163/62 | HR 60 | Temp 98.4°F | Resp 18 | Ht 68.0 in | Wt 213.8 lb

## 2015-07-19 DIAGNOSIS — D509 Iron deficiency anemia, unspecified: Secondary | ICD-10-CM

## 2015-07-19 DIAGNOSIS — N289 Disorder of kidney and ureter, unspecified: Secondary | ICD-10-CM

## 2015-07-19 DIAGNOSIS — C180 Malignant neoplasm of cecum: Secondary | ICD-10-CM

## 2015-07-19 DIAGNOSIS — C189 Malignant neoplasm of colon, unspecified: Secondary | ICD-10-CM

## 2015-07-19 DIAGNOSIS — R234 Changes in skin texture: Secondary | ICD-10-CM

## 2015-07-19 LAB — COMPREHENSIVE METABOLIC PANEL
ALK PHOS: 90 U/L (ref 40–150)
ALT: 21 U/L (ref 0–55)
ANION GAP: 8 meq/L (ref 3–11)
AST: 33 U/L (ref 5–34)
Albumin: 3.6 g/dL (ref 3.5–5.0)
BUN: 15.6 mg/dL (ref 7.0–26.0)
CALCIUM: 9.4 mg/dL (ref 8.4–10.4)
CHLORIDE: 107 meq/L (ref 98–109)
CO2: 27 mEq/L (ref 22–29)
Creatinine: 1.2 mg/dL — ABNORMAL HIGH (ref 0.6–1.1)
EGFR: 47 mL/min/{1.73_m2} — AB (ref >90)
Glucose: 165 mg/dl — ABNORMAL HIGH (ref 70–140)
POTASSIUM: 4.5 meq/L (ref 3.5–5.1)
Sodium: 142 mEq/L (ref 136–145)
Total Bilirubin: 0.86 mg/dL (ref 0.20–1.20)
Total Protein: 6.7 g/dL (ref 6.4–8.3)

## 2015-07-19 LAB — CBC WITH DIFFERENTIAL/PLATELET
BASO%: 0.6 % (ref 0.0–2.0)
Basophils Absolute: 0 10*3/uL (ref 0.0–0.1)
EOS%: 2.6 % (ref 0.0–7.0)
Eosinophils Absolute: 0.2 10*3/uL (ref 0.0–0.5)
HCT: 36.3 % (ref 34.8–46.6)
HEMOGLOBIN: 12.2 g/dL (ref 11.6–15.9)
LYMPH%: 40.8 % (ref 14.0–49.7)
MCH: 35.8 pg — AB (ref 25.1–34.0)
MCHC: 33.6 g/dL (ref 31.5–36.0)
MCV: 106.6 fL — ABNORMAL HIGH (ref 79.5–101.0)
MONO#: 0.4 10*3/uL (ref 0.1–0.9)
MONO%: 5.4 % (ref 0.0–14.0)
NEUT%: 50.6 % (ref 38.4–76.8)
NEUTROS ABS: 3.3 10*3/uL (ref 1.5–6.5)
Platelets: 137 10*3/uL — ABNORMAL LOW (ref 145–400)
RBC: 3.41 10*6/uL — ABNORMAL LOW (ref 3.70–5.45)
RDW: 21 % — AB (ref 11.2–14.5)
WBC: 6.5 10*3/uL (ref 3.9–10.3)
lymph#: 2.7 10*3/uL (ref 0.9–3.3)

## 2015-07-19 NOTE — Progress Notes (Signed)
  [place] OFFICE PROGRESS NOTE   Diagnosis: Colon cancer  INTERVAL HISTORY:   Cheryl Jordan returns as scheduled. She completed cycle 6 adjuvant Xeloda beginning 07/02/2015. No mouth sores or diarrhea. She has developed superficial desquamation at the feet. No pain.  Objective:  Vital signs in last 24 hours:  Blood pressure 163/62, pulse 60, temperature 98.4 F (36.9 C), temperature source Oral, resp. rate 18, height _0  (1.727 m), weight 213 lb 12.8 oz (96.979 kg), SpO2 97 %.    HEENT: No thrush or ulcers Resp: Lungs clear bilaterally Cardio: Regular rate and rhythm GI: No hepatomegaly, nontender Vascular: The left lower leg is slightly larger than the right side, no edema or erythema  Skin: Mild hyperpigmentation and erythema of the palms, mild erythema with superficial desquamation at the soles.   Lab Results:  Lab Results  Component Value Date   WBC 6.5 07/19/2015   HGB 12.2 07/19/2015   HCT 36.3 07/19/2015   MCV 106.6* 07/19/2015   PLT 137* 07/19/2015   NEUTROABS 3.3 07/19/2015     Medications: I have reviewed the patient's current medications.  Assessment/Plan: 1. 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  Loss of MLH1 and PMS2 expression  Microsatellite instability-high  CT chest 03/03/2015-negative for metastatic disease  Cycle 1 adjuvant Xeloda 03/18/2015  Cycle 2 adjuvant Xeloda 04/09/2015  Cycle 3 adjuvant Xeloda 04/30/2015  Cycle 4 adjuvant Xeloda 05/21/2015  Cycle 5 adjuvant Xeloda 06/11/2015 (dose reduced due to desquamation of feet).  Cycle 6 adjuvant Xeloda 07/02/2015  Cycle 7 adjuvant Xeloda 07/23/2015  2. Anemia, microcytic. Likely iron deficiency from  previous GI blood loss. Oral iron initiated 03/17/2015. Improved. 3. 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 Diabetes 5. Hypertension   Disposition:  Ms. Pung appears stable. She has superficial desquamation at the feet without pain or ulceration. The plan is to begin cycle 7 adjuvant Xeloda01/01/2016. She will return for an office visit in 3 weeks. She will discontinue Xeloda and contact us if she develops hand or foot pain.  Mikey Bussing, DNP, AGPCNP-BC, AOCNP  07/19/2015  2:59 PM

## 2015-08-09 MED FILL — CAPECITABINE 500 MG TABLET: 500 | 14 days supply | Qty: 70 | Fill #1

## 2015-08-10 ENCOUNTER — Telehealth: Payer: Self-pay | Admitting: Oncology

## 2015-08-10 ENCOUNTER — Other Ambulatory Visit (HOSPITAL_BASED_OUTPATIENT_CLINIC_OR_DEPARTMENT_OTHER): Payer: Medicare Other

## 2015-08-10 ENCOUNTER — Ambulatory Visit (HOSPITAL_BASED_OUTPATIENT_CLINIC_OR_DEPARTMENT_OTHER): Payer: Medicare Other | Admitting: Oncology

## 2015-08-10 VITALS — BP 168/71 | HR 60 | Temp 98.1°F | Resp 18 | Ht 68.0 in | Wt 214.6 lb

## 2015-08-10 DIAGNOSIS — C180 Malignant neoplasm of cecum: Secondary | ICD-10-CM | POA: Diagnosis not present

## 2015-08-10 DIAGNOSIS — C183 Malignant neoplasm of hepatic flexure: Secondary | ICD-10-CM | POA: Diagnosis not present

## 2015-08-10 DIAGNOSIS — C189 Malignant neoplasm of colon, unspecified: Secondary | ICD-10-CM

## 2015-08-10 DIAGNOSIS — D509 Iron deficiency anemia, unspecified: Secondary | ICD-10-CM

## 2015-08-10 DIAGNOSIS — N289 Disorder of kidney and ureter, unspecified: Secondary | ICD-10-CM

## 2015-08-10 DIAGNOSIS — L271 Localized skin eruption due to drugs and medicaments taken internally: Secondary | ICD-10-CM

## 2015-08-10 LAB — CBC WITH DIFFERENTIAL/PLATELET
BASO%: 0.2 % (ref 0.0–2.0)
BASOS ABS: 0 10*3/uL (ref 0.0–0.1)
EOS ABS: 0.1 10*3/uL (ref 0.0–0.5)
EOS%: 2 % (ref 0.0–7.0)
HEMATOCRIT: 37.4 % (ref 34.8–46.6)
HEMOGLOBIN: 12.6 g/dL (ref 11.6–15.9)
LYMPH#: 2.6 10*3/uL (ref 0.9–3.3)
LYMPH%: 41 % (ref 14.0–49.7)
MCH: 35.7 pg — AB (ref 25.1–34.0)
MCHC: 33.7 g/dL (ref 31.5–36.0)
MCV: 105.9 fL — AB (ref 79.5–101.0)
MONO#: 0.4 10*3/uL (ref 0.1–0.9)
MONO%: 5.6 % (ref 0.0–14.0)
NEUT#: 3.3 10*3/uL (ref 1.5–6.5)
NEUT%: 51.2 % (ref 38.4–76.8)
Platelets: 119 10*3/uL — ABNORMAL LOW (ref 145–400)
RBC: 3.53 10*6/uL — ABNORMAL LOW (ref 3.70–5.45)
RDW: 17.3 % — AB (ref 11.2–14.5)
WBC: 6.4 10*3/uL (ref 3.9–10.3)

## 2015-08-10 LAB — COMPREHENSIVE METABOLIC PANEL
ALBUMIN: 3.6 g/dL (ref 3.5–5.0)
ALK PHOS: 96 U/L (ref 40–150)
ALT: 19 U/L (ref 0–55)
ANION GAP: 7 meq/L (ref 3–11)
AST: 30 U/L (ref 5–34)
BUN: 15.7 mg/dL (ref 7.0–26.0)
CALCIUM: 9.2 mg/dL (ref 8.4–10.4)
CO2: 27 mEq/L (ref 22–29)
Chloride: 108 mEq/L (ref 98–109)
Creatinine: 1.2 mg/dL — ABNORMAL HIGH (ref 0.6–1.1)
EGFR: 46 mL/min/{1.73_m2} — AB (ref >90)
Glucose: 150 mg/dl — ABNORMAL HIGH (ref 70–140)
POTASSIUM: 4.5 meq/L (ref 3.5–5.1)
Sodium: 142 mEq/L (ref 136–145)
Total Bilirubin: 0.79 mg/dL (ref 0.20–1.20)
Total Protein: 6.8 g/dL (ref 6.4–8.3)

## 2015-08-10 NOTE — Telephone Encounter (Signed)
per pof to sch pt appt-gave pt copy of avs °

## 2015-08-10 NOTE — Progress Notes (Signed)
  [place] OFFICE PROGRESS NOTE   Diagnosis: Colon cancer  INTERVAL HISTORY:   Ms. Barritt returns as scheduled. She feels well. She completed another cycle of Xeloda beginning 07/23/2015. No mouth sores or diarrhea. She has mild "burning "of the feet.  Objective:  Vital signs in last 24 hours:  Blood pressure 168/71, pulse 60, temperature 98.1 F (36.7 C), temperature source Oral, resp. rate 18, height 5' 8" (1.727 m), weight 214 lb 9.6 oz (97.342 kg), SpO2 100 %.    HEENT: No thrush or ulcers Lymphatics: No cervical or supra-clavicular nodes Resp: Lungs clear bilaterally Cardio: Regular rate and rhythm GI: No hepatomegaly, nontender Vascular: The left lower leg is slightly larger than the right side, no edema or erythema  Skin: Mild hyperpigmentation of the hands. Mild erythema with superficial desquamation of the soles.     Lab Results:  Lab Results  Component Value Date   WBC 6.4 08/10/2015   HGB 12.6 08/10/2015   HCT 37.4 08/10/2015   MCV 105.9* 08/10/2015   PLT 119* 08/10/2015   NEUTROABS 3.3 08/10/2015      Lab Results  Component Value Date   CEA 3.5 02/24/2015     Medications: I have reviewed the patient's current medications.  Assessment/Plan: 1. 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  Loss of MLH1 and PMS2 expression  Microsatellite instability-high  CT chest 03/03/2015-negative for metastatic disease  Cycle 1 adjuvant Xeloda 03/18/2015  Cycle 2 adjuvant Xeloda 04/09/2015  Cycle 3 adjuvant Xeloda 04/30/2015  Cycle 4 adjuvant Xeloda 05/21/2015  Cycle 5 adjuvant Xeloda 06/11/2015 (dose reduced due to desquamation of feet).  Cycle 6 adjuvant Xeloda  07/02/2015  Cycle 7 adjuvant Xeloda 07/23/2015  Cycle 8 adjuvant Xeloda 08/13/2015  2. Anemia, microcytic. Likely iron deficiency from previous GI blood loss. Oral iron initiated 03/17/2015. Improved. 3. 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 Diabetes 5. Hypertension 6. Mild hand/foot syndrome secondary to Xeloda   Disposition:  Cheryl Jordan appears stable. She will complete a final cycle of Xeloda beginning 08/13/2015. She will return for an office visit and CEA in one month.  [name], MD  08/10/2015  10:40 AM

## 2015-08-11 DIAGNOSIS — C189 Malignant neoplasm of colon, unspecified: Secondary | ICD-10-CM | POA: Insufficient documentation

## 2015-08-11 DIAGNOSIS — E785 Hyperlipidemia, unspecified: Secondary | ICD-10-CM | POA: Insufficient documentation

## 2015-08-11 DIAGNOSIS — E1169 Type 2 diabetes mellitus with other specified complication: Secondary | ICD-10-CM | POA: Insufficient documentation

## 2015-08-12 ENCOUNTER — Telehealth: Payer: Self-pay | Admitting: *Deleted

## 2015-08-12 ENCOUNTER — Telehealth: Payer: Self-pay | Admitting: Nurse Practitioner

## 2015-08-12 NOTE — Telephone Encounter (Signed)
Oncology Nurse Navigator Documentation  Oncology Nurse Navigator Flowsheets 08/12/2015  Navigator Location [facility]  Navigator Encounter Type Telephone--6 mo f/u  Telephone Outgoing Call  Patient Visit Type -  Treatment Phase Final Chemo TX-Xeloda starts final cycle 08/13/15  Barriers/Navigation Needs No Questions;No Needs;No barriers at this time. Last few fills had no co pay.This fill was $360  Interventions -Instructed her to call for any questions or concerns  Referrals -  Acuity Level 1  Time Spent with Patient 15

## 2015-08-12 NOTE — Telephone Encounter (Signed)
cld & spoke to pt and gave updated time & sch dates for appt for 2/22 & 3/22

## 2015-09-06 ENCOUNTER — Other Ambulatory Visit: Payer: Self-pay | Admitting: Nurse Practitioner

## 2015-09-06 DIAGNOSIS — C180 Malignant neoplasm of cecum: Secondary | ICD-10-CM

## 2015-09-07 ENCOUNTER — Other Ambulatory Visit: Payer: Medicare Other

## 2015-09-07 ENCOUNTER — Telehealth: Payer: Self-pay | Admitting: Nurse Practitioner

## 2015-09-07 ENCOUNTER — Ambulatory Visit: Payer: Medicare Other | Admitting: Nurse Practitioner

## 2015-09-07 ENCOUNTER — Other Ambulatory Visit (HOSPITAL_BASED_OUTPATIENT_CLINIC_OR_DEPARTMENT_OTHER): Payer: Medicare Other

## 2015-09-07 ENCOUNTER — Ambulatory Visit (HOSPITAL_BASED_OUTPATIENT_CLINIC_OR_DEPARTMENT_OTHER): Payer: Medicare Other | Admitting: Nurse Practitioner

## 2015-09-07 VITALS — BP 160/57 | HR 63 | Temp 98.3°F | Resp 18 | Ht 68.0 in | Wt 215.9 lb

## 2015-09-07 DIAGNOSIS — C180 Malignant neoplasm of cecum: Secondary | ICD-10-CM

## 2015-09-07 DIAGNOSIS — D509 Iron deficiency anemia, unspecified: Secondary | ICD-10-CM

## 2015-09-07 DIAGNOSIS — E119 Type 2 diabetes mellitus without complications: Secondary | ICD-10-CM

## 2015-09-07 DIAGNOSIS — N289 Disorder of kidney and ureter, unspecified: Secondary | ICD-10-CM | POA: Diagnosis not present

## 2015-09-07 DIAGNOSIS — I10 Essential (primary) hypertension: Secondary | ICD-10-CM

## 2015-09-07 DIAGNOSIS — C189 Malignant neoplasm of colon, unspecified: Secondary | ICD-10-CM

## 2015-09-07 LAB — CBC WITH DIFFERENTIAL/PLATELET
BASO%: 0.2 % (ref 0.0–2.0)
BASOS ABS: 0 10*3/uL (ref 0.0–0.1)
EOS ABS: 0.1 10*3/uL (ref 0.0–0.5)
EOS%: 2.3 % (ref 0.0–7.0)
HEMATOCRIT: 38 % (ref 34.8–46.6)
HEMOGLOBIN: 12.5 g/dL (ref 11.6–15.9)
LYMPH%: 39.4 % (ref 14.0–49.7)
MCH: 34.8 pg — ABNORMAL HIGH (ref 25.1–34.0)
MCHC: 32.9 g/dL (ref 31.5–36.0)
MCV: 105.8 fL — ABNORMAL HIGH (ref 79.5–101.0)
MONO#: 0.2 10*3/uL (ref 0.1–0.9)
MONO%: 4.9 % (ref 0.0–14.0)
NEUT%: 53.2 % (ref 38.4–76.8)
NEUTROS ABS: 2.6 10*3/uL (ref 1.5–6.5)
PLATELETS: 117 10*3/uL — AB (ref 145–400)
RBC: 3.59 10*6/uL — ABNORMAL LOW (ref 3.70–5.45)
RDW: 15.6 % — AB (ref 11.2–14.5)
WBC: 4.9 10*3/uL (ref 3.9–10.3)
lymph#: 1.9 10*3/uL (ref 0.9–3.3)

## 2015-09-07 NOTE — Progress Notes (Signed)
  [place] OFFICE PROGRESS NOTE   Diagnosis:  Colon cancer  INTERVAL HISTORY:   [name] returns as scheduled. She completed the eighth and final cycle of adjuvant Xeloda beginning 08/13/2015. She denies nausea/vomiting. No mouth sores. No diarrhea. No hand or foot pain or redness. She notes some peeling skin on the heels.  Objective:  Vital signs in last 24 hours:  Blood pressure 160/57, pulse 63, temperature 98.3 F (36.8 C), temperature source Oral, resp. rate 18, height _0  (1.727 m), weight 215 lb 14.4 oz (97.932 kg), SpO2 97 %.    HEENT: No thrush or ulcers.  Lymphatics: No palpable cervical, supra clavicular, axillary or inguinal lymph nodes. Resp: Lungs clear bilaterally. Cardio: Regular rate and rhythm. GI: Abdomen soft and nontender. No hepatomegaly. No mass. Vascular: No leg edema. Skin: Palms with mild hyperpigmentation. No skin breakdown.    Lab Results:  Lab Results  Component Value Date   WBC 4.9 09/07/2015   HGB 12.5 09/07/2015   HCT 38.0 09/07/2015   MCV 105.8* 09/07/2015   PLT 117* 09/07/2015   NEUTROABS 2.6 09/07/2015    Imaging:  No results found.  Medications: I have reviewed the patient's current medications.  Assessment/Plan: 1. 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  Loss of MLH1 and PMS2 expression  Microsatellite instability-high  CT chest 03/03/2015-negative for metastatic disease  Cycle 1 adjuvant Xeloda 03/18/2015  Cycle 2 adjuvant Xeloda 04/09/2015  Cycle 3 adjuvant Xeloda 04/30/2015  Cycle 4 adjuvant Xeloda 05/21/2015  Cycle 5 adjuvant Xeloda 06/11/2015 (dose reduced due to desquamation of feet).  Cycle 6 adjuvant Xeloda  07/02/2015  Cycle 7 adjuvant Xeloda 07/23/2015  Cycle 8 adjuvant Xeloda 08/13/2015  2. Anemia, microcytic. Likely iron deficiency from previous GI blood loss. Oral iron initiated 03/17/2015. Improved. 3. 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 She is followed by Dr. Alyson Ingles, urology. 4. Diabetes 5. Hypertension 6. History of mild hand/foot syndrome secondary to Xeloda   Disposition: Cheryl Jordan appears stable. She has completed the planned course of adjuvant chemotherapy. We will follow-up on the CEA from today. The plan is for restaging CT scans at the end of July 2017 which will be one year from diagnosis. She plans to contact Dr. Amedeo Jordan to schedule her 1 year colonoscopy.  We will see her back 2-3 days after the July CT scan to review the results. She will contact the office in the interim with any problems.  Plan reviewed with Dr. Benay Spice.    Ned Card ANP/GNP-BC   09/07/2015  10:54 AM

## 2015-09-07 NOTE — Telephone Encounter (Signed)
per pof to sch pt appt-adv pt central sch willc all to sch scan-gave pt copy of avs

## 2015-09-08 ENCOUNTER — Telehealth: Payer: Self-pay | Admitting: *Deleted

## 2015-09-08 LAB — CEA (PARALLEL TESTING): CEA: 2.8 ng/mL — AB

## 2015-09-08 LAB — CEA: CEA1: 3.6 ng/mL (ref 0.0–4.7)

## 2015-09-08 NOTE — Telephone Encounter (Signed)
Oncology Nurse Navigator Documentation  Oncology Nurse Navigator Flowsheets 09/08/2015  Navigator Location [facility]  Navigator Encounter Type Telephone  Telephone Outgoing Call;Diagnostic Results--CEA results and discussed parallel testing with [facility] and will mail copy to home  Patient Visit Type -  Treatment Phase -  Barriers/Navigation Needs -None  Interventions Education Method  Referrals -  Education Method Verbal  Acuity Level 1  Time Spent with Patient 15

## 2015-09-19 ENCOUNTER — Other Ambulatory Visit: Payer: Self-pay

## 2015-09-19 DIAGNOSIS — Z1231 Encounter for screening mammogram for malignant neoplasm of breast: Secondary | ICD-10-CM

## 2015-09-28 ENCOUNTER — Ambulatory Visit
Admission: RE | Admit: 2015-09-28 | Discharge: 2015-09-28 | Disposition: A | Discharge status: Home / Self Care | Payer: Medicare Other | Source: Ambulatory Visit

## 2015-09-28 DIAGNOSIS — Z1231 Encounter for screening mammogram for malignant neoplasm of breast: Secondary | ICD-10-CM

## 2015-10-05 ENCOUNTER — Ambulatory Visit: Payer: Medicare Other | Admitting: Nurse Practitioner

## 2015-10-05 ENCOUNTER — Other Ambulatory Visit: Payer: Medicare Other

## 2016-02-06 ENCOUNTER — Other Ambulatory Visit (HOSPITAL_BASED_OUTPATIENT_CLINIC_OR_DEPARTMENT_OTHER): Payer: Medicare Other

## 2016-02-06 ENCOUNTER — Encounter (HOSPITAL_COMMUNITY): Payer: Self-pay

## 2016-02-06 ENCOUNTER — Ambulatory Visit (HOSPITAL_COMMUNITY)
Admission: RE | Admit: 2016-02-06 | Discharge: 2016-02-06 | Disposition: A | Discharge status: Home / Self Care | Payer: Medicare Other | Source: Ambulatory Visit | Attending: Nurse Practitioner | Admitting: Nurse Practitioner

## 2016-02-06 DIAGNOSIS — Z9049 Acquired absence of other specified parts of digestive tract: Secondary | ICD-10-CM | POA: Diagnosis not present

## 2016-02-06 DIAGNOSIS — K639 Disease of intestine, unspecified: Secondary | ICD-10-CM | POA: Diagnosis not present

## 2016-02-06 DIAGNOSIS — C189 Malignant neoplasm of colon, unspecified: Secondary | ICD-10-CM

## 2016-02-06 DIAGNOSIS — C180 Malignant neoplasm of cecum: Secondary | ICD-10-CM | POA: Diagnosis not present

## 2016-02-06 DIAGNOSIS — K7460 Unspecified cirrhosis of liver: Secondary | ICD-10-CM | POA: Diagnosis not present

## 2016-02-06 LAB — CBC WITH DIFFERENTIAL/PLATELET
BASO%: 0.5 % (ref 0.0–2.0)
Basophils Absolute: 0 10*3/uL (ref 0.0–0.1)
EOS ABS: 0.1 10*3/uL (ref 0.0–0.5)
EOS%: 1.6 % (ref 0.0–7.0)
HCT: 41.6 % (ref 34.8–46.6)
HEMOGLOBIN: 13.7 g/dL (ref 11.6–15.9)
LYMPH%: 39 % (ref 14.0–49.7)
MCH: 30.4 pg (ref 25.1–34.0)
MCHC: 32.8 g/dL (ref 31.5–36.0)
MCV: 92.6 fL (ref 79.5–101.0)
MONO#: 0.2 10*3/uL (ref 0.1–0.9)
MONO%: 3.9 % (ref 0.0–14.0)
NEUT%: 55 % (ref 38.4–76.8)
NEUTROS ABS: 3.1 10*3/uL (ref 1.5–6.5)
Platelets: 111 10*3/uL — ABNORMAL LOW (ref 145–400)
RBC: 4.5 10*6/uL (ref 3.70–5.45)
RDW: 14.5 % (ref 11.2–14.5)
WBC: 5.6 10*3/uL (ref 3.9–10.3)
lymph#: 2.2 10*3/uL (ref 0.9–3.3)

## 2016-02-06 LAB — COMPREHENSIVE METABOLIC PANEL
ALBUMIN: 3.8 g/dL (ref 3.5–5.0)
ALK PHOS: 117 U/L (ref 40–150)
ALT: 32 U/L (ref 0–55)
AST: 36 U/L — AB (ref 5–34)
Anion Gap: 10 mEq/L (ref 3–11)
BILIRUBIN TOTAL: 0.75 mg/dL (ref 0.20–1.20)
BUN: 13.8 mg/dL (ref 7.0–26.0)
CO2: 24 meq/L (ref 22–29)
CREATININE: 1 mg/dL (ref 0.6–1.1)
Calcium: 9.6 mg/dL (ref 8.4–10.4)
Chloride: 107 mEq/L (ref 98–109)
EGFR: 53 mL/min/{1.73_m2} — ABNORMAL LOW (ref >90)
GLUCOSE: 151 mg/dL — AB (ref 70–140)
Potassium: 4.3 mEq/L (ref 3.5–5.1)
SODIUM: 142 meq/L (ref 136–145)
TOTAL PROTEIN: 7.3 g/dL (ref 6.4–8.3)

## 2016-02-06 MED ORDER — IOPAMIDOL (ISOVUE-300) INJECTION 61%
100.0000 mL | Freq: Once | INTRAVENOUS | Status: AC | PRN
Start: 2016-02-06 — End: 2016-02-06
  Administered 2016-02-06: 100 mL via INTRAVENOUS

## 2016-02-07 LAB — CEA (PARALLEL TESTING): CEA: 3.5 ng/mL — ABNORMAL HIGH

## 2016-02-07 LAB — CEA: CEA1: 4.2 ng/mL (ref 0.0–4.7)

## 2016-02-10 ENCOUNTER — Ambulatory Visit (HOSPITAL_BASED_OUTPATIENT_CLINIC_OR_DEPARTMENT_OTHER): Payer: Medicare Other | Admitting: Oncology

## 2016-02-10 ENCOUNTER — Telehealth: Payer: Self-pay | Admitting: Oncology

## 2016-02-10 VITALS — BP 167/60 | HR 59 | Temp 98.6°F | Resp 18 | Ht 68.0 in | Wt 219.5 lb

## 2016-02-10 DIAGNOSIS — D509 Iron deficiency anemia, unspecified: Secondary | ICD-10-CM

## 2016-02-10 DIAGNOSIS — C189 Malignant neoplasm of colon, unspecified: Secondary | ICD-10-CM

## 2016-02-10 DIAGNOSIS — C180 Malignant neoplasm of cecum: Secondary | ICD-10-CM

## 2016-02-10 DIAGNOSIS — N289 Disorder of kidney and ureter, unspecified: Secondary | ICD-10-CM | POA: Diagnosis not present

## 2016-02-10 NOTE — Telephone Encounter (Signed)
Gave pt cal & avs °

## 2016-02-10 NOTE — Progress Notes (Signed)
  [place] OFFICE PROGRESS NOTE   Diagnosis: Colon cancer  INTERVAL HISTORY:   [name] returns as scheduled. She feels well. No complaint. She is scheduled for a surveillance colonoscopy with Dr. Amedeo Jordan.  Objective:  Vital signs in last 24 hours:  Blood pressure (!) 167/60, pulse (!) 59, temperature 98.6 F (37 C), temperature source Oral, resp. rate 18, height _0  (1.727 m), weight 219 lb 8 oz (99.6 kg), SpO2 97 %.    HEENT: Neck without mass Lymphatics: No cervical, supra-clavicular, axillary, or inguinal nodes Resp: Lungs clear bilaterally Cardio: Regular rate and rhythm GI: No hepatosplenic the, no mass, nontender Vascular: No leg edema   Lab Results:  Lab Results  Component Value Date   WBC 5.6 02/06/2016   HGB 13.7 02/06/2016   HCT 41.6 02/06/2016   MCV 92.6 02/06/2016   PLT 111 (L) 02/06/2016   NEUTROABS 3.1 02/06/2016   Lab Results  Component Value Date   CEA1 4.2 02/06/2016       Medications: I have reviewed the patient's current medications.  Assessment/Plan: 1. 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 ? Loss of MLH1 and PMS2 expression ? Microsatellite instability-high ? CT chest 03/03/2015-negative for metastatic disease ? Cycle 1 adjuvant Xeloda 03/18/2015 ? Cycle 2 adjuvant Xeloda 04/09/2015 ? Cycle 3 adjuvant Xeloda 04/30/2015 ? Cycle 4 adjuvant Xeloda 05/21/2015 ? Cycle 5 adjuvant Xeloda 06/11/2015 (dose reduced due to desquamation of feet). ? Cycle 6 adjuvant Xeloda 07/02/2015 ? Cycle 7 adjuvant Xeloda 07/23/2015 ? Cycle 8 adjuvant Xeloda 08/13/2015 ? CTs chest, abdomen, and pelvis 02/06/2016-cirrhosis, heterogenous enhancement of the liver, small  hypoenhancing lesions in the liver-? 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 Anemia, microcytic. Likely iron deficiency from previous GI blood loss. Oral iron initiated 03/17/2015. Improved. 3. 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 She is followed by Dr. Alyson Jordan, urology. Unchanged on CT 02/06/2016 4. Diabetes 5. Hypertension 6. History of mild hand/foot syndrome secondary to Xeloda    Disposition:  [name] remains in clinical remission from colon cancer. I suspect the hypoenhancing areas in the liver are related to cirrhosis. I will present her case at the GI tumor conference to decide on the indication for additional imaging.  She will return for a CEA in 3 months and an office visit in 6 months.  [name], MD  02/10/2016  12:00 PM

## 2016-02-15 ENCOUNTER — Encounter: Payer: Self-pay | Admitting: Genetic Counselor

## 2016-03-02 ENCOUNTER — Other Ambulatory Visit: Payer: Self-pay | Admitting: Gastroenterology

## 2016-05-11 ENCOUNTER — Other Ambulatory Visit: Payer: Medicare Other

## 2016-05-11 DIAGNOSIS — C189 Malignant neoplasm of colon, unspecified: Secondary | ICD-10-CM

## 2016-05-11 LAB — CEA (IN HOUSE-CHCC): CEA (CHCC-In House): 4.26 ng/mL (ref 0.00–5.00)

## 2016-05-12 LAB — CEA: CEA1: 3.7 ng/mL (ref 0.0–4.7)

## 2016-05-14 ENCOUNTER — Telehealth: Payer: Self-pay | Admitting: *Deleted

## 2016-05-14 NOTE — Telephone Encounter (Signed)
-----   Message from Ladell Pier, MD sent at 05/13/2016  8:32 AM EDT ----- Please call patient, cea is normal

## 2016-08-10 ENCOUNTER — Other Ambulatory Visit (HOSPITAL_BASED_OUTPATIENT_CLINIC_OR_DEPARTMENT_OTHER): Payer: Medicare Other

## 2016-08-10 ENCOUNTER — Ambulatory Visit (HOSPITAL_BASED_OUTPATIENT_CLINIC_OR_DEPARTMENT_OTHER): Payer: Medicare Other | Admitting: Nurse Practitioner

## 2016-08-10 ENCOUNTER — Telehealth: Payer: Self-pay | Admitting: Oncology

## 2016-08-10 VITALS — BP 178/63 | HR 60 | Temp 97.8°F | Resp 18 | Ht 68.0 in | Wt 220.4 lb

## 2016-08-10 DIAGNOSIS — Z85038 Personal history of other malignant neoplasm of large intestine: Secondary | ICD-10-CM

## 2016-08-10 DIAGNOSIS — C189 Malignant neoplasm of colon, unspecified: Secondary | ICD-10-CM

## 2016-08-10 DIAGNOSIS — N289 Disorder of kidney and ureter, unspecified: Secondary | ICD-10-CM | POA: Diagnosis not present

## 2016-08-10 DIAGNOSIS — D500 Iron deficiency anemia secondary to blood loss (chronic): Secondary | ICD-10-CM | POA: Diagnosis not present

## 2016-08-10 LAB — CEA (IN HOUSE-CHCC): CEA (CHCC-In House): 4.21 ng/mL (ref 0.00–5.00)

## 2016-08-10 NOTE — Telephone Encounter (Signed)
Appointments scheduled per 1/26 LOS. Patient given AVS report and calendars with future scheduled appointments. Patient given two bottles of contrast and instructions for CT scan appointment.

## 2016-08-10 NOTE — Progress Notes (Signed)
  [place] OFFICE PROGRESS NOTE   Diagnosis:  Colon cancer  INTERVAL HISTORY:   Cheryl Jordan returns as scheduled. No change in bowel habits. No abdominal pain. No nausea or vomiting. She has a good appetite.  She reports having a colonoscopy earlier this week with multiple polyps removed.  Objective:  Vital signs in last 24 hours:  Blood pressure (!) 178/63, pulse 60, temperature 97.8 F (36.6 C), temperature source Oral, resp. rate 18, height _0  (1.727 m), weight 220 lb 6.4 oz (100 kg), SpO2 98 %.    HEENT: No neck mass. Lymphatics: No palpable cervical, supraclavicular, axillary or inguinal lymph nodes. Resp: Lungs clear bilaterally. Cardio: Regular rate and rhythm. GI: Abdomen soft and nontender. Hepatomegaly. Vascular: No leg edema.   Lab Results:  Lab Results  Component Value Date   WBC 5.6 02/06/2016   HGB 13.7 02/06/2016   HCT 41.6 02/06/2016   MCV 92.6 02/06/2016   PLT 111 (L) 02/06/2016   NEUTROABS 3.1 02/06/2016    Imaging:  No results found.  Medications: I have reviewed the patient's current medications.  Assessment/Plan: 1. 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 ? Loss of MLH1 and PMS2 expression ? Microsatellite instability-high ? CT chest 03/03/2015-negative for metastatic disease ? Cycle 1 adjuvant Xeloda 03/18/2015 ? Cycle 2 adjuvant Xeloda 04/09/2015 ? Cycle 3 adjuvant Xeloda 04/30/2015 ? Cycle 4 adjuvant Xeloda 05/21/2015 ? Cycle 5 adjuvant Xeloda 06/11/2015 (dose reduced due to desquamation of feet). ? Cycle 6 adjuvant Xeloda 07/02/2015 ? Cycle 7 adjuvant Xeloda 07/23/2015 ? Cycle 8 adjuvant Xeloda 08/13/2015 ? CTs chest, abdomen, and pelvis  02/06/2016-cirrhosis, heterogenous enhancement of the liver, small hypoenhancing lesions in the liver-? 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 Likely regenerative nodules in the liver; does not appear as [place] or metastases. Left renal mass very slow growing. CT scan recommended in one year) ? Colonoscopy 08/06/2016-multiple 5-15 mm very subtle sessile serrated adenomas found in the transverse colon and at the anastomosis. Next colonoscopy at a one-year interval.  2. Anemia, microcytic. Likely iron deficiency from previous GI blood loss. Oral iron initiated 03/17/2015. Improved. 3. 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 She is followed by Dr. Alyson Ingles, urology. Unchanged on CT 02/06/2016 4. Diabetes 5. Hypertension 6. History of mild hand/foot syndrome secondary to Xeloda   Disposition: Cheryl Jordan remains in clinical remission from colon cancer. We will follow-up on the CEA from today. We are referring her for CT scans in 6 months. She will return for a follow-up visit a few days after the scans to review the results. She will contact the office in the interim with any problems.  She will continue colonoscopy follow-up with Dr. Stephanie Acre at [facility].   Ned Card ANP/GNP-BC   08/10/2016  12:47 PM

## 2016-08-11 LAB — CEA: CEA: 3.6 ng/mL (ref 0.0–4.7)

## 2016-08-13 ENCOUNTER — Telehealth: Payer: Self-pay | Admitting: *Deleted

## 2016-08-13 NOTE — Telephone Encounter (Signed)
-----   Message from Ladell Pier, MD sent at 08/12/2016  6:26 PM EST ----- Please call patient, Cheryl Jordan is normal

## 2016-08-14 NOTE — Telephone Encounter (Signed)
Called and informed pt of normal cea result. Pt verbalized understanding and denies any questions or concerns at this time

## 2016-08-14 NOTE — Telephone Encounter (Signed)
-----   Message from Ladell Pier, MD sent at 08/12/2016  6:26 PM EST ----- Please call patient, cea is normal

## 2016-11-22 ENCOUNTER — Other Ambulatory Visit: Payer: Self-pay | Admitting: Urology

## 2016-11-22 DIAGNOSIS — N289 Disorder of kidney and ureter, unspecified: Secondary | ICD-10-CM

## 2016-12-06 ENCOUNTER — Ambulatory Visit
Admission: RE | Admit: 2016-12-06 | Discharge: 2016-12-06 | Disposition: A | Discharge status: Home / Self Care | Payer: Medicare Other | Source: Ambulatory Visit | Attending: Urology | Admitting: Urology

## 2016-12-06 DIAGNOSIS — N289 Disorder of kidney and ureter, unspecified: Secondary | ICD-10-CM

## 2016-12-06 MED ORDER — GADOBENATE DIMEGLUMINE 529 MG/ML IV SOLN
20.0000 mL | Freq: Once | INTRAVENOUS | Status: AC | PRN
  Administered 2016-12-06: 20 mL via INTRAVENOUS

## 2016-12-20 ENCOUNTER — Telehealth: Payer: Self-pay | Admitting: Oncology

## 2016-12-20 NOTE — Telephone Encounter (Signed)
Spoke with patient re 6/19 f/u

## 2017-01-01 ENCOUNTER — Ambulatory Visit (HOSPITAL_BASED_OUTPATIENT_CLINIC_OR_DEPARTMENT_OTHER): Payer: Medicare Other

## 2017-01-01 ENCOUNTER — Telehealth: Payer: Self-pay | Admitting: Oncology

## 2017-01-01 ENCOUNTER — Ambulatory Visit (HOSPITAL_BASED_OUTPATIENT_CLINIC_OR_DEPARTMENT_OTHER): Payer: Medicare Other | Admitting: Oncology

## 2017-01-01 VITALS — BP 170/64 | HR 63 | Temp 97.2°F | Resp 18 | Ht 68.0 in | Wt 221.2 lb

## 2017-01-01 DIAGNOSIS — K7460 Unspecified cirrhosis of liver: Secondary | ICD-10-CM | POA: Diagnosis not present

## 2017-01-01 DIAGNOSIS — C189 Malignant neoplasm of colon, unspecified: Secondary | ICD-10-CM

## 2017-01-01 DIAGNOSIS — C180 Malignant neoplasm of cecum: Secondary | ICD-10-CM

## 2017-01-01 DIAGNOSIS — K769 Liver disease, unspecified: Secondary | ICD-10-CM | POA: Diagnosis not present

## 2017-01-01 LAB — CBC WITH DIFFERENTIAL/PLATELET
BASO%: 1 % (ref 0.0–2.0)
Basophils Absolute: 0.1 10*3/uL (ref 0.0–0.1)
EOS%: 1.1 % (ref 0.0–7.0)
Eosinophils Absolute: 0.1 10*3/uL (ref 0.0–0.5)
HEMATOCRIT: 39.4 % (ref 34.8–46.6)
HEMOGLOBIN: 13.2 g/dL (ref 11.6–15.9)
LYMPH#: 2.5 10*3/uL (ref 0.9–3.3)
LYMPH%: 42.2 % (ref 14.0–49.7)
MCH: 31 pg (ref 25.1–34.0)
MCHC: 33.4 g/dL (ref 31.5–36.0)
MCV: 92.9 fL (ref 79.5–101.0)
MONO#: 0.3 10*3/uL (ref 0.1–0.9)
MONO%: 4.3 % (ref 0.0–14.0)
NEUT#: 3 10*3/uL (ref 1.5–6.5)
NEUT%: 51.4 % (ref 38.4–76.8)
Platelets: 109 10*3/uL — ABNORMAL LOW (ref 145–400)
RBC: 4.24 10*6/uL (ref 3.70–5.45)
RDW: 14.6 % — AB (ref 11.2–14.5)
WBC: 5.8 10*3/uL (ref 3.9–10.3)

## 2017-01-01 LAB — COMPREHENSIVE METABOLIC PANEL
ALBUMIN: 3.6 g/dL (ref 3.5–5.0)
ALT: 35 U/L (ref 0–55)
AST: 42 U/L — AB (ref 5–34)
Alkaline Phosphatase: 100 U/L (ref 40–150)
Anion Gap: 8 mEq/L (ref 3–11)
BUN: 14.3 mg/dL (ref 7.0–26.0)
CALCIUM: 9.4 mg/dL (ref 8.4–10.4)
CHLORIDE: 109 meq/L (ref 98–109)
CO2: 25 mEq/L (ref 22–29)
CREATININE: 1 mg/dL (ref 0.6–1.1)
EGFR: 53 mL/min/{1.73_m2} — ABNORMAL LOW (ref >90)
GLUCOSE: 130 mg/dL (ref 70–140)
POTASSIUM: 4.8 meq/L (ref 3.5–5.1)
SODIUM: 142 meq/L (ref 136–145)
Total Bilirubin: 0.88 mg/dL (ref 0.20–1.20)
Total Protein: 6.7 g/dL (ref 6.4–8.3)

## 2017-01-01 LAB — PROTIME-INR
INR: 1.1 — ABNORMAL LOW (ref 2.00–3.50)
PROTIME: 13.2 s (ref 10.6–13.4)

## 2017-01-01 LAB — CEA (IN HOUSE-CHCC): CEA (CHCC-IN HOUSE): 4.58 ng/mL (ref 0.00–5.00)

## 2017-01-01 NOTE — Progress Notes (Signed)
[place] OFFICE PROGRESS NOTE   Diagnosis: Colon cancer  INTERVAL HISTORY:   Cheryl Jordan returns prior to a scheduled visit. She had an MRI of the abdomen 12/06/2016. A known left renal mass is unchanged. A lesion in the right liver, segment 8, measures 13 mm compared to 9 mm on a CT in July 2017. No adenopathy. Other liver lesions are stable and felt to represent hemangiomas or vascular abnormalities. Changes of cirrhosis are noted.  She feels well. No difficulty with bowel function. Good appetite. She reports intermittent pain at the right heel for the past 4 months. She reports Dr. Edrick Oh has diagnosed her with plantar fasciitis. She underwent a colonoscopy at [facility] 08/06/2016. Multiple small "serrated adenomas "in the transverse colon and at the anastomosis were removed. The pathology revealed a serrated polyp without dysplasia at the anastomotic biopsy site.  Objective:  Vital signs in last 24 hours:  Blood pressure (!) 170/64, pulse 63, temperature 97.2 F (36.2 C), temperature source Oral, resp. rate 18, height 5' 8"  (1.727 m), weight 221 lb 3.2 oz (100.3 kg), SpO2 97 %.    HEENT: Neck without mass Lymphatics: No cervical, supraclavicular, axillary, or inguinal nodes Resp: Lungs clear bilaterally Cardio: Regular rate and rhythm GI: No hepatosplenomegaly, no mass, nontender Vascular: No leg edema   Lab Results: CEA and AFP pending   Medications: I have reviewed the patient's current medications.  Assessment/Plan: 1. 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 ? Loss of MLH1 and PMS2 expression ? Microsatellite instability-high ? CT chest 03/03/2015-negative for metastatic disease ? Cycle 1  adjuvant Xeloda 03/18/2015 ? Cycle 2 adjuvant Xeloda 04/09/2015 ? Cycle 3 adjuvant Xeloda 04/30/2015 ? Cycle 4 adjuvant Xeloda 05/21/2015 ? Cycle 5 adjuvant Xeloda 06/11/2015 (dose reduced due to desquamation of feet). ? Cycle 6 adjuvant Xeloda 07/02/2015 ? Cycle 7 adjuvant Xeloda 07/23/2015 ? Cycle 8 adjuvant Xeloda 08/13/2015 ? CTs chest, abdomen, and pelvis 02/06/2016-cirrhosis, heterogenous enhancement of the liver, small hypoenhancing lesions in the liver-? 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 Likely regenerative nodules in the liver; does not appear as [place] or metastases. Left renal mass very slow growing. CT scan recommended in one year) ? Colonoscopy 08/06/2016-multiple 5-15 mm very subtle sessile serrated adenomas found in the transverse colon and at the anastomosis. Next colonoscopy at a one-year interval.  2. Anemia, microcytic. Likely iron deficiency from previous GI blood loss. Oral iron initiated 03/17/2015. Improved. 3. 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 She is followed by Dr. Alyson Ingles, urology.Unchanged on CT 02/06/2016, unchanged on MRI 12/06/2016 4. Diabetes 5. Hypertension 6. History of mild hand/foot syndrome secondary to Xeloda 7. Enlarging segment 8 liver lesion noted on MRI 12/06/2016-felt to potentially represent a metastasis or HCC    Disposition:  She remains in clinical remission from colon cancer. I discussed the MRI findings with her. There is a small segment 8 liver lesion that has enlarged compared to a CT from a year ago. We discussed the differential diagnosis. We will check an AFP and CEA today. I will present her case at the GI tumor conference next week. She will be referred to interventional radiology if the GI tumor group agrees.  Cheryl Jordan will return as scheduled 02/12/2017.  25 minutes were  spent with the patient today. The majority of the time was used for  counseling and coordination of care.  Donneta Romberg, MD  01/01/2017  8:45 AM

## 2017-01-01 NOTE — Telephone Encounter (Signed)
Appts already scheduled per 6/19 los. Gave patient AVS and calender.

## 2017-01-02 LAB — AFP TUMOR MARKER: AFP, SERUM, TUMOR MARKER: 3.1 ng/mL (ref 0.0–8.3)

## 2017-01-11 ENCOUNTER — Telehealth: Payer: Self-pay | Admitting: *Deleted

## 2017-01-11 DIAGNOSIS — C189 Malignant neoplasm of colon, unspecified: Secondary | ICD-10-CM

## 2017-01-11 NOTE — Telephone Encounter (Signed)
Called pt with update. Per Dr. Benay Spice: recommendation after GI conference is for chest CT and Interventional Radiology eval for liver ablation. Informed her that radiology will call to schedule both, pt voiced understandig.Marland Kitchen

## 2017-01-24 ENCOUNTER — Ambulatory Visit
Admission: RE | Admit: 2017-01-24 | Discharge: 2017-01-24 | Disposition: A | Discharge status: Home / Self Care | Payer: Medicare Other | Source: Ambulatory Visit | Attending: Oncology | Admitting: Oncology

## 2017-01-24 DIAGNOSIS — C189 Malignant neoplasm of colon, unspecified: Secondary | ICD-10-CM

## 2017-01-24 HISTORY — PX: IR RADIOLOGIST EVAL & MGMT: IMG5224

## 2017-01-25 ENCOUNTER — Other Ambulatory Visit (HOSPITAL_COMMUNITY): Payer: Self-pay | Admitting: Interventional Radiology

## 2017-01-25 DIAGNOSIS — C189 Malignant neoplasm of colon, unspecified: Secondary | ICD-10-CM

## 2017-01-28 ENCOUNTER — Telehealth: Payer: Self-pay | Admitting: *Deleted

## 2017-01-28 NOTE — Telephone Encounter (Signed)
-----   Message from Ladell Pier, MD sent at 01/25/2017  9:49 AM EDT ----- Please call patient, chest CT negative for cancer, proceed with IR evaluation as planned

## 2017-01-28 NOTE — Telephone Encounter (Signed)
Called pt with CT result per MD note below. She voiced understanding. Pt has biopsy scheduled for 7/25, office visit 7/31. Will review with provider to clarify whether additional labs are needed prior to visit.(Currently scheduled for 7/26)

## 2017-01-30 NOTE — Consult Note (Signed)
Chief Complaint: Patient was seen in consultation today for liver lesion and possible ablation at the request of Sherrill,Gary B  Referring Physician(s): Sherrill,Gary B  History of Present Illness: Cheryl Jordan is a 76 y.o. 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 She underwent 8 cycles of Xeloda. Colonoscopy in January revealed adenomas in the transverse colon without carcinoma.  CT of the abdomen in July, 2017 demonstrated a new 9 mm lesion in the peripheral aspect of segment 5 of the right lobe of the liver. 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 MRI of the abdomen on 12/06/2016 demonstrated some enlargement and peripheral enhancement of this lesion. Additional lesions of the liver are felt to represent a combination of hemangiomas and benign vascular abnormalities.  Past Medical History:  Diagnosis Date  . Anemia    hx of  . Arthritis   . Colon cancer ([place]) dx'd 11/2014  . Diabetes mellitus without complication ([place])   . GERD (gastroesophageal reflux disease)   . History of gout   . Hypertension   . Hypothyroidism   . PONV (postoperative nausea and vomiting)     Past Surgical History:  Procedure Laterality Date  . CHOLECYSTECTOMY    . KNEE SURGERY    . NEPHRECTOMY Left 02/10/2015   Procedure: OPEN RETROPERINTONEAL EXPLORATION LEFT RENAL  CYST DECORTICATION X 5;  Surgeon: Cleon Gustin, MD;  Location: [facility];  Service: Urology;  Laterality: Left;  . PARTIAL COLECTOMY N/A 02/10/2015   Procedure: OPEN RIGHT  COLECTOMY ;  Surgeon: [name], MD;  Location: [facility];  Service: General;  Laterality: N/A;  . TUBAL LIGATION      Allergies: Aspirin and Codeine  Medications: Prior to Admission medications   Medication Sig Start Date End Date Taking? Authorizing Provider  amLODipine (NORVASC) 5 MG tablet Take 5 mg by mouth every  morning.    Yes [provider]  atenolol (TENORMIN) 50 MG tablet Take 50 mg by mouth every morning.    Yes [provider]  glipiZIDE (GLUCOTROL XL) 5 MG 24 hr tablet Take 5 mg by mouth daily with breakfast.   Yes [provider]  levothyroxine (SYNTHROID, LEVOTHROID) 88 MCG tablet Take 88 mcg by mouth daily.   Yes [provider]  LORazepam (ATIVAN) 0.5 MG tablet Take 0.5 mg by mouth every 8 (eight) hours as needed. for anxiety 12/04/16  Yes [provider]  losartan (COZAAR) 50 MG tablet Take 25 mg by mouth every morning.    Yes [provider]  omeprazole (PRILOSEC) 20 MG capsule Take 20 mg by mouth daily. 12/03/16  Yes [provider]     Family History  Problem Relation Age of Onset  . Diabetes Mother   . Hypertension Other     Social History   Social History  . Marital status: Married    Spouse name: N/A  . Number of children: N/A  . Years of education: N/A   Social History Main Topics  . Smoking status: Never Smoker  . Smokeless tobacco: Never Used  . Alcohol use No  . Drug use: No  . Sexual activity: No   Other Topics Concern  . Not on file   Social History Narrative   03/03/15-Married, husband Stefanie Libel 2 years   #2 grown sons and #2 grand daughters   Has outdoor cat  Retired from [employer] to garden and can food when well       ECOG Status: 0 - Asymptomatic  Review of Systems: A 12 point ROS discussed and pertinent positives are indicated in the HPI above.  All other systems are negative.  Review of Systems  Constitutional: Positive for fatigue. Negative for activity change, appetite change, chills, fever and unexpected weight change.  HENT: Negative.   Respiratory: Negative.   Cardiovascular: Negative.   Gastrointestinal: Negative.   Genitourinary: Negative.   Musculoskeletal: Negative.   Neurological: Negative.     Vital Signs: BP (!) 197/96   Pulse 64   Temp 98 F (36.7  C) (Oral)   Resp 14   Ht 5' 8"  (1.727 m)   Wt 221 lb (100.2 kg)   SpO2 94%   BMI 33.60 kg/m   Physical Exam  Constitutional: She is oriented to person, place, and time. She appears well-developed and well-nourished. No distress.  Cardiovascular: Normal rate, regular rhythm and normal heart sounds.  Exam reveals no gallop and no friction rub.   No murmur heard. Pulmonary/Chest: Effort normal and breath sounds normal. No respiratory distress. She has no wheezes. She has no rales.  Abdominal: Soft. Bowel sounds are normal. She exhibits no distension. There is no tenderness. There is no rebound and no guarding.  Musculoskeletal:  Mild lower leg edema bilaterally.  Neurological: She is alert and oriented to person, place, and time.  Skin: She is not diaphoretic.  Nursing note and vitals reviewed.   Imaging: Ct Chest Wo Contrast  Result Date: 01/24/2017 CLINICAL DATA:  Colon adenocarcinoma. Cirrhosis. 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 Restaging. EXAM: CT CHEST WITHOUT CONTRAST TECHNIQUE: Multidetector CT imaging of the chest was performed following the standard protocol without IV contrast. COMPARISON:  02/06/2016 FINDINGS: Cardiovascular: No acute findings. Stable mild cardiomegaly. Aortic atherosclerosis. Mediastinum/Nodes: No masses or pathologically enlarged lymph nodes identified on this unenhanced exam. Lungs/Pleura: No pulmonary infiltrate or mass identified. Stable mild bilateral lower lobe scarring. No effusion present. Upper Abdomen:  Hepatic cirrhosis and splenomegaly again noted. Musculoskeletal:  No suspicious bone lesions. IMPRESSION: No evidence of metastatic disease or other acute findings within the thorax. Aortic Atherosclerosis (ICD10-I70.0). Electronically Signed   By: Earle Gell M.D.   On: 01/24/2017 17:16    Labs:  CBC:  Recent Labs  02/06/16 0958 01/01/17 0912  WBC 5.6 5.8  HGB 13.7 13.2  HCT 41.6 39.4  PLT 111* 109*    COAGS:  Recent Labs  01/01/17 0912    INR 1.10*    BMP:  Recent Labs  02/06/16 0958 01/01/17 0913  NA 142 142  K 4.3 4.8  CO2 24 25  GLUCOSE 151* 130  BUN 13.8 14.3  CALCIUM 9.6 9.4  CREATININE 1.0 1.0    LIVER FUNCTION TESTS:  Recent Labs  02/06/16 0958 01/01/17 0913  BILITOT 0.75 0.88  AST 36* 42*  ALT 32 35  ALKPHOS 117 100  PROT 7.3 6.7  ALBUMIN 3.8 3.6    TUMOR MARKERS:  Recent Labs  02/06/16 0958  CEA 3.5*    Assessment and Plan:  I met With Cheryl Jordan and her husband. We reviewed imaging findings. Based on my review, the lesion within the peripheral aspect of the right lobe of the liver in segment 5 measures roughly 9 mm in short axis on both the CT last July as well as the MRI study in May of this year. I'm not convinced that this has grown  significantly. In addition, her CEA and AFP levels are not elevated. There is evidence of significant nodular cirrhosis of the liver and there is possibility that this may represent an enhancing regenerating nodule. However, this certainly could represent a low-grade malignancy such as an early hepatocellular carcinoma.  I would have expected a colonic carcinoma metastasis to show more convincing growth over this time frame.  Cheryl Jordan is somewhat frail and I recommended that we perform biopsy of the lesion before committing to more definitive ablation therapy. I would like to prove that the lesion is malignant prior to committing her to performing thermal ablation which would require general anesthesia and overnight hospital observation. We did discuss details of thermal ablation of this lesion. Given its small size, it should respond well to ablation if it becomes necessary to treat the lesion.  Cheryl Jordan is agreeable to scheduling outpatient biopsy of the liver lesion. She would like to have the procedure performed at [facility].   Thank you for this interesting consult.  I greatly enjoyed meeting Cheryl Jordan and look forward to participating in their care.   A copy of this report was sent to the requesting provider on this date.  Electronically SignedAletta Edouard T 01/30/2017, 11:32 AM   I spent a total of 40 Minutes in face to face in clinical consultation, greater than 50% of which was counseling/coordinating care for a right lobe liver lesion.

## 2017-02-05 ENCOUNTER — Other Ambulatory Visit: Payer: Self-pay | Admitting: Radiology

## 2017-02-06 ENCOUNTER — Ambulatory Visit (HOSPITAL_COMMUNITY)
Admission: RE | Admit: 2017-02-06 | Discharge: 2017-02-06 | Disposition: A | Discharge status: Home / Self Care | Payer: Medicare Other | Source: Ambulatory Visit | Attending: Interventional Radiology | Admitting: Interventional Radiology

## 2017-02-06 ENCOUNTER — Encounter (HOSPITAL_COMMUNITY): Payer: Self-pay

## 2017-02-06 DIAGNOSIS — Z885 Allergy status to narcotic agent status: Secondary | ICD-10-CM | POA: Insufficient documentation

## 2017-02-06 DIAGNOSIS — M109 Gout, unspecified: Secondary | ICD-10-CM | POA: Diagnosis not present

## 2017-02-06 DIAGNOSIS — Z9889 Other specified postprocedural states: Secondary | ICD-10-CM | POA: Diagnosis not present

## 2017-02-06 DIAGNOSIS — C189 Malignant neoplasm of colon, unspecified: Secondary | ICD-10-CM | POA: Insufficient documentation

## 2017-02-06 DIAGNOSIS — E119 Type 2 diabetes mellitus without complications: Secondary | ICD-10-CM | POA: Insufficient documentation

## 2017-02-06 DIAGNOSIS — Z9049 Acquired absence of other specified parts of digestive tract: Secondary | ICD-10-CM | POA: Insufficient documentation

## 2017-02-06 DIAGNOSIS — Z7984 Long term (current) use of oral hypoglycemic drugs: Secondary | ICD-10-CM | POA: Insufficient documentation

## 2017-02-06 DIAGNOSIS — C779 Secondary and unspecified malignant neoplasm of lymph node, unspecified: Secondary | ICD-10-CM | POA: Insufficient documentation

## 2017-02-06 DIAGNOSIS — Z905 Acquired absence of kidney: Secondary | ICD-10-CM | POA: Insufficient documentation

## 2017-02-06 DIAGNOSIS — Z79899 Other long term (current) drug therapy: Secondary | ICD-10-CM | POA: Insufficient documentation

## 2017-02-06 DIAGNOSIS — Z886 Allergy status to analgesic agent status: Secondary | ICD-10-CM | POA: Diagnosis not present

## 2017-02-06 DIAGNOSIS — I10 Essential (primary) hypertension: Secondary | ICD-10-CM | POA: Insufficient documentation

## 2017-02-06 DIAGNOSIS — E039 Hypothyroidism, unspecified: Secondary | ICD-10-CM | POA: Insufficient documentation

## 2017-02-06 DIAGNOSIS — K219 Gastro-esophageal reflux disease without esophagitis: Secondary | ICD-10-CM | POA: Insufficient documentation

## 2017-02-06 DIAGNOSIS — K7460 Unspecified cirrhosis of liver: Secondary | ICD-10-CM | POA: Insufficient documentation

## 2017-02-06 LAB — CBC WITH DIFFERENTIAL/PLATELET
BASOS ABS: 0 10*3/uL (ref 0.0–0.1)
Basophils Relative: 0 %
EOS ABS: 0.1 10*3/uL (ref 0.0–0.7)
Eosinophils Relative: 2 %
HEMATOCRIT: 39.7 % (ref 36.0–46.0)
HEMOGLOBIN: 13.5 g/dL (ref 12.0–15.0)
LYMPHS PCT: 41 %
Lymphs Abs: 2.5 10*3/uL (ref 0.7–4.0)
MCH: 31.6 pg (ref 26.0–34.0)
MCHC: 34 g/dL (ref 30.0–36.0)
MCV: 93 fL (ref 78.0–100.0)
MONOS PCT: 5 %
Monocytes Absolute: 0.3 10*3/uL (ref 0.1–1.0)
NEUTROS ABS: 3.2 10*3/uL (ref 1.7–7.7)
NEUTROS PCT: 52 %
Platelets: UNDETERMINED 10*3/uL (ref 150–400)
RBC: 4.27 MIL/uL (ref 3.87–5.11)
RDW: 14.5 % (ref 11.5–15.5)
WBC: 6.1 10*3/uL (ref 4.0–10.5)

## 2017-02-06 LAB — PROTIME-INR
INR: 1.07
Prothrombin Time: 14 seconds (ref 11.4–15.2)

## 2017-02-06 LAB — COMPREHENSIVE METABOLIC PANEL
ALBUMIN: 3.9 g/dL (ref 3.5–5.0)
ALK PHOS: 97 U/L (ref 38–126)
ALT: 40 U/L (ref 14–54)
AST: 64 U/L — AB (ref 15–41)
Anion gap: 9 (ref 5–15)
BILIRUBIN TOTAL: 0.6 mg/dL (ref 0.3–1.2)
BUN: 15 mg/dL (ref 6–20)
CALCIUM: 9.4 mg/dL (ref 8.9–10.3)
CO2: 24 mmol/L (ref 22–32)
Chloride: 108 mmol/L (ref 101–111)
Creatinine, Ser: 0.96 mg/dL (ref 0.44–1.00)
GFR calc Af Amer: >60 mL/min (ref >60)
GFR calc non Af Amer: 56 mL/min — ABNORMAL LOW (ref >60)
GLUCOSE: 139 mg/dL — AB (ref 65–99)
Potassium: 4.7 mmol/L (ref 3.5–5.1)
SODIUM: 141 mmol/L (ref 135–145)
TOTAL PROTEIN: 7.3 g/dL (ref 6.5–8.1)

## 2017-02-06 LAB — GLUCOSE, CAPILLARY: GLUCOSE-CAPILLARY: 131 mg/dL — AB (ref 65–99)

## 2017-02-06 MED ORDER — SODIUM CHLORIDE 0.9 % IV SOLN
INTRAVENOUS | Status: DC
  Administered 2017-02-06: 12:00:00 via INTRAVENOUS

## 2017-02-06 MED ORDER — FENTANYL CITRATE (PF) 100 MCG/2ML IJ SOLN
INTRAMUSCULAR | Status: AC | PRN
  Administered 2017-02-06: 50 ug via INTRAVENOUS

## 2017-02-06 MED ORDER — FENTANYL CITRATE (PF) 100 MCG/2ML IJ SOLN
INTRAMUSCULAR | Status: AC
  Filled 2017-02-06: qty 4

## 2017-02-06 MED ORDER — MIDAZOLAM HCL 2 MG/2ML IJ SOLN
INTRAMUSCULAR | Status: AC
  Filled 2017-02-06: qty 6

## 2017-02-06 MED ORDER — MIDAZOLAM HCL 2 MG/2ML IJ SOLN
INTRAMUSCULAR | Status: AC | PRN
  Administered 2017-02-06 (×2): 1 mg via INTRAVENOUS

## 2017-02-06 NOTE — H&P (Signed)
Referring Physician(s): Sherrill,B  Supervising Physician: Aletta Edouard  Patient Status:  [facility]  Chief Complaint:  "I'm having a biopsy"  Subjective: Patient familiar to IR service from recent consultation with Dr. Kathlene Cote 01/24/17 for evaluation of liver lesion and possible ablation candidacy. She is a 76 y.o. female with a history of stage IIIc adenocarcinoma of the cecum , status post right colectomy in July, 2016 with 8 of 20 lymph nodes positive for metastatic carcinoma. She underwent 8 cycles of Xeloda. Colonoscopy in January revealed adenomas in the transverse colon without carcinoma. CT of the abdomen in July, 2017 demonstrated a new 9 mm lesion in the peripheral aspect of segment 5 of the right lobe of the liver. 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 MRI of the abdomen on 12/06/2016 demonstrated some enlargement and peripheral enhancement of this lesion. Additional lesions of the liver are felt to represent a combination of hemangiomas and benign vascular abnormalities. She presents today for image guided liver lesion biopsy for further evaluation prior to any potential ablative therapies. Past Medical History:  Diagnosis Date  . Anemia    hx of  . Arthritis   . Colon cancer ([place]) dx'd 11/2014  . Diabetes mellitus without complication ([place])   . GERD (gastroesophageal reflux disease)   . History of gout   . Hypertension   . Hypothyroidism   . PONV (postoperative nausea and vomiting)    Past Surgical History:  Procedure Laterality Date  . CHOLECYSTECTOMY    . KNEE SURGERY    . NEPHRECTOMY Left 02/10/2015   Procedure: OPEN RETROPERINTONEAL EXPLORATION LEFT RENAL  CYST DECORTICATION X 5;  Surgeon: Cleon Gustin, MD;  [facility];  Service: Urology;  Laterality: Left;  . PARTIAL COLECTOMY N/A 02/10/2015   Procedure: OPEN RIGHT  COLECTOMY ;  Surgeon: [name], MD;  Location: [facility];  Service: General;  Laterality: N/A;  . TUBAL LIGATION         Allergies: Aspirin and Codeine  Medications: Prior to Admission medications   Medication Sig Start Date End Date Taking? Authorizing Provider  amLODipine (NORVASC) 5 MG tablet Take 5 mg by mouth every morning.    Yes [provider]  atenolol (TENORMIN) 50 MG tablet Take 50 mg by mouth every morning.    Yes [provider]  glipiZIDE (GLUCOTROL XL) 5 MG 24 hr tablet Take 5 mg by mouth daily with breakfast.   Yes [provider]  levothyroxine (SYNTHROID, LEVOTHROID) 88 MCG tablet Take 88 mcg by mouth daily.   Yes [provider]  LORazepam (ATIVAN) 0.5 MG tablet Take 0.5 mg by mouth every 8 (eight) hours as needed. for anxiety 12/04/16  Yes [provider]  losartan (COZAAR) 50 MG tablet Take 25 mg by mouth every morning.    Yes [provider]  omeprazole (PRILOSEC) 20 MG capsule Take 20 mg by mouth daily. 12/03/16  Yes [provider]     Vital Signs: BP (!) 175/68   Pulse 68   Temp 98.7 F (37.1 C) (Oral)   Resp 16   SpO2 96%   Physical Exam awake, alert. Chest clear to auscultation bilaterally. Heart with regular rate and rhythm. Abdomen soft, obese, positive bowel sounds, nontender. Trace to 1+ pretibial edema bilaterally.  Imaging: No results found.  Labs:  CBC:  Recent Labs  01/01/17 0912  WBC 5.8  HGB 13.2  HCT 39.4  PLT 109*    COAGS:  Recent Labs  01/01/17  0912  INR 1.10*    BMP:  Recent Labs  01/01/17 0913  NA 142  K 4.8  CO2 25  GLUCOSE 130  BUN 14.3  CALCIUM 9.4  CREATININE 1.0    LIVER FUNCTION TESTS:  Recent Labs  01/01/17 0913  BILITOT 0.88  AST 42*  ALT 35  ALKPHOS 100  PROT 6.7  ALBUMIN 3.6    Assessment and Plan: 76 y.o. female with a history of stage IIIc adenocarcinoma of the cecum , status post right colectomy in July, 2016 with 8 of 20 lymph nodes positive for metastatic carcinoma. She underwent 8 cycles of Xeloda. Colonoscopy in January revealed  adenomas in the transverse colon without carcinoma. CT of the abdomen in July, 2017 demonstrated a new 9 mm lesion in the peripheral aspect of segment 5 of the right lobe of the liver. 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 MRI of the abdomen on 12/06/2016 demonstrated some enlargement and peripheral enhancement of this lesion. Additional lesions of the liver are felt to represent a combination of hemangiomas and benign vascular abnormalities. Seen in consultation by Dr. Kathlene Cote on 01/24/17 to discuss further evaluation of right liver lesion and possible ablation. She presents today for image guided right liver lesion biopsy to r/o malignancy prior to any potential ablative therapies.Risks and benefits discussed with the patient /family including, but not limited to bleeding, infection, damage to adjacent structures or low yield requiring additional tests.All of the patient's questions were answered, patient is agreeable to proceed. Consent signed and in chart.      Electronically Signed: D. Rowe Robert, PA-C 02/06/2017, 11:43 AM   I spent a total of 20 minutes at the the patient's bedside AND on the patient's hospital floor or unit, greater than 50% of which was counseling/coordinating care for image guided liver lesion biopsy

## 2017-02-06 NOTE — Procedures (Signed)
Interventional Radiology Procedure Note  Procedure: US guided liver biopsy  Complications: None  Estimated Blood Loss: < 10 mL  [place] localized a 1.3 x 1.0 x 1.3 ovoid, hypoechoic lateral right lobe liver lesion corresponding to the previous CT and MR abnormality. 18 G core biopsy x 3 via 17 G needle.   Gelfoam pledgets advanced on completion.  Venetia Night. Kathlene Cote, M.D Pager:  [phone]

## 2017-02-06 NOTE — Discharge Instructions (Signed)
Liver Biopsy, Care After °These instructions give you information on caring for yourself after your procedure. Your doctor may also give you more specific instructions. Call your doctor if you have any problems or questions after your procedure. °Follow these instructions at home: °· Rest at home for 1-2 days or as told by your doctor. °· Have someone stay with you for at least 24 hours. °· Do not do these things in the first 24 hours: °? Drive. °? Use machinery. °? Take care of other people. °? Sign legal documents. °? Take a bath or shower. °· There are many different ways to close and cover a cut (incision). For example, a cut can be closed with stitches, skin glue, or adhesive strips. Follow your doctor's instructions on: °? Taking care of your cut. °? Changing and removing your bandage (dressing). °? Removing whatever was used to close your cut. °· Do not drink alcohol in the first week. °· Do not lift more than 5 pounds or play contact sports for the first 2 weeks. °· Take medicines only as told by your doctor. For 1 week, do not take medicine that has aspirin in it or medicines like ibuprofen. °· Get your test results. °Contact a doctor if: °· A cut bleeds and leaves more than just a small spot of blood. °· A cut is red, puffs up (swells), or hurts more than before. °· Fluid or something else comes from a cut. °· A cut smells bad. °· You have a fever or chills. °Get help right away if: °· You have swelling, bloating, or pain in your belly (abdomen). °· You get dizzy or faint. °· You have a rash. °· You feel sick to your stomach (nauseous) or throw up (vomit). °· You have trouble breathing, feel short of breath, or feel faint. °· Your chest hurts. °· You have problems talking or seeing. °· You have trouble balancing or moving your arms or legs. °This information is not intended to replace advice given to you by your health care provider. Make sure you discuss any questions you have with your health care  provider. °Document Released: 04/10/2008 Document Revised: 12/08/2015 Document Reviewed: 08/28/2013 °Elsevier Interactive Patient Education © 2018 [employer] ° ° ° ° °Moderate Conscious Sedation, Adult, Care After °These instructions provide you with information about caring for yourself after your procedure. Your health care provider may also give you more specific instructions. Your treatment has been planned according to current medical practices, but problems sometimes occur. Call your health care provider if you have any problems or questions after your procedure. °What can I expect after the procedure? °After your procedure, it is common: °· To feel sleepy for several hours. °· To feel clumsy and have poor balance for several hours. °· To have poor judgment for several hours. °· To vomit if you eat too soon. ° °Follow these instructions at home: °For at least 24 hours after the procedure: ° °· Do not: °? Participate in activities where you could fall or become injured. °? Drive. °? Use heavy machinery. °? Drink alcohol. °? Take sleeping pills or medicines that cause drowsiness. °? Make important decisions or sign legal documents. °? Take care of children on your own. °· Rest. °Eating and drinking °· Follow the diet recommended by your health care provider. °· If you vomit: °? Drink water, juice, or soup when you can drink without vomiting. °? Make sure you have little or no nausea before eating solid foods. °General instructions °·   Have a responsible adult stay with you until you are awake and alert. °· Take over-the-counter and prescription medicines only as told by your health care provider. °· If you smoke, do not smoke without supervision. °· Keep all follow-up visits as told by your health care provider. This is important. °Contact a health care provider if: °· You keep feeling nauseous or you keep vomiting. °· You feel light-headed. °· You develop a rash. °· You have a fever. °Get help right away  if: °· You have trouble breathing. °This information is not intended to replace advice given to you by your health care provider. Make sure you discuss any questions you have with your health care provider. °Document Released: 04/22/2013 Document Revised: 12/05/2015 Document Reviewed: 10/22/2015 °Elsevier Interactive Patient Education © 2018 Elsevier Inc[employer] ° ° °

## 2017-02-07 ENCOUNTER — Other Ambulatory Visit (HOSPITAL_BASED_OUTPATIENT_CLINIC_OR_DEPARTMENT_OTHER): Payer: Medicare Other

## 2017-02-07 DIAGNOSIS — C180 Malignant neoplasm of cecum: Secondary | ICD-10-CM | POA: Diagnosis not present

## 2017-02-07 DIAGNOSIS — C189 Malignant neoplasm of colon, unspecified: Secondary | ICD-10-CM

## 2017-02-07 LAB — CBC WITH DIFFERENTIAL/PLATELET
BASO%: 0.2 % (ref 0.0–2.0)
BASOS ABS: 0 10*3/uL (ref 0.0–0.1)
EOS%: 1.3 % (ref 0.0–7.0)
Eosinophils Absolute: 0.1 10*3/uL (ref 0.0–0.5)
HEMATOCRIT: 40 % (ref 34.8–46.6)
HEMOGLOBIN: 13 g/dL (ref 11.6–15.9)
LYMPH#: 2.1 10*3/uL (ref 0.9–3.3)
LYMPH%: 41.1 % (ref 14.0–49.7)
MCH: 31.3 pg (ref 25.1–34.0)
MCHC: 32.5 g/dL (ref 31.5–36.0)
MCV: 96.4 fL (ref 79.5–101.0)
MONO#: 0.3 10*3/uL (ref 0.1–0.9)
MONO%: 5.2 % (ref 0.0–14.0)
NEUT#: 2.7 10*3/uL (ref 1.5–6.5)
NEUT%: 52.2 % (ref 38.4–76.8)
Platelets: 103 10*3/uL — ABNORMAL LOW (ref 145–400)
RBC: 4.15 10*6/uL (ref 3.70–5.45)
RDW: 14.5 % (ref 11.2–14.5)
WBC: 5.2 10*3/uL (ref 3.9–10.3)

## 2017-02-07 LAB — COMPREHENSIVE METABOLIC PANEL
ALBUMIN: 3.5 g/dL (ref 3.5–5.0)
ALK PHOS: 103 U/L (ref 40–150)
ALT: 37 U/L (ref 0–55)
AST: 42 U/L — AB (ref 5–34)
Anion Gap: 8 mEq/L (ref 3–11)
BILIRUBIN TOTAL: 0.74 mg/dL (ref 0.20–1.20)
BUN: 18.2 mg/dL (ref 7.0–26.0)
CALCIUM: 9.6 mg/dL (ref 8.4–10.4)
CO2: 25 mEq/L (ref 22–29)
CREATININE: 1.2 mg/dL — AB (ref 0.6–1.1)
Chloride: 108 mEq/L (ref 98–109)
EGFR: 44 mL/min/{1.73_m2} — ABNORMAL LOW (ref >90)
Glucose: 297 mg/dl — ABNORMAL HIGH (ref 70–140)
Potassium: 4.4 mEq/L (ref 3.5–5.1)
Sodium: 141 mEq/L (ref 136–145)
Total Protein: 6.7 g/dL (ref 6.4–8.3)

## 2017-02-07 LAB — CEA (IN HOUSE-CHCC): CEA (CHCC-IN HOUSE): 4.83 ng/mL (ref 0.00–5.00)

## 2017-02-12 ENCOUNTER — Telehealth: Payer: Self-pay

## 2017-02-12 ENCOUNTER — Ambulatory Visit (HOSPITAL_BASED_OUTPATIENT_CLINIC_OR_DEPARTMENT_OTHER): Payer: Medicare Other | Admitting: Oncology

## 2017-02-12 VITALS — BP 166/62 | HR 62 | Temp 98.2°F | Resp 18 | Ht 68.0 in | Wt 220.1 lb

## 2017-02-12 DIAGNOSIS — C180 Malignant neoplasm of cecum: Secondary | ICD-10-CM

## 2017-02-12 DIAGNOSIS — C189 Malignant neoplasm of colon, unspecified: Secondary | ICD-10-CM

## 2017-02-12 DIAGNOSIS — K7460 Unspecified cirrhosis of liver: Secondary | ICD-10-CM

## 2017-02-12 NOTE — Progress Notes (Signed)
[place] OFFICE PROGRESS NOTE   Diagnosis: Colon cancer, cirrhosis  INTERVAL HISTORY:   Cheryl Jordan returns as scheduled. She saw Dr. Kathlene Cote 01/24/2017 to consider ablation of the liver lesion. He recommended a biopsy the lesion prior to proceeding with an ablation procedure. She underwent an ultrasound guided biopsy of the liver lesion on 02/06/2017. The final pathology is pending. I discussed the case with Dr. Melina Copa and she reports the biopsy shows cirrhosis with no evidence of malignancy.  Ms. Bramhall reports tolerating the procedure well. She had mild discomfort at the biopsy site.  Objective:  Vital signs in last 24 hours:  Blood pressure (!) 166/62, pulse 62, temperature 98.2 F (36.8 C), temperature source Oral, resp. rate 18, height 5' 8"  (1.727 m), weight 220 lb 1.6 oz (99.8 kg), SpO2 96 %.  Resp: Lungs clear bilaterally Cardio: Regular rate and rhythm GI: No hepatomegaly, no mass, mild tenderness at the right upper lateral abdomen/chest biopsy site. Small resolving ecchymosis Vascular: The left lower leg is slightly larger than the right side, no edema   Lab Results:  Lab Results  Component Value Date   WBC 5.2 02/07/2017   HGB 13.0 02/07/2017   HCT 40.0 02/07/2017   MCV 96.4 02/07/2017   PLT 103 (L) 02/07/2017   NEUTROABS 2.7 02/07/2017    CMP     Component Value Date/Time   NA 141 02/07/2017 0942   K 4.4 02/07/2017 0942   CL 108 02/06/2017 1116   CO2 25 02/07/2017 0942   GLUCOSE 297 (H) 02/07/2017 0942   BUN 18.2 02/07/2017 0942   CREATININE 1.2 (H) 02/07/2017 0942   CALCIUM 9.6 02/07/2017 0942   PROT 6.7 02/07/2017 0942   ALBUMIN 3.5 02/07/2017 0942   AST 42 (H) 02/07/2017 0942   ALT 37 02/07/2017 0942   ALKPHOS 103 02/07/2017 0942   BILITOT 0.74 02/07/2017 0942   GFRNONAA 56 (L) 02/06/2017 1116   GFRAA >60 02/06/2017 1116    Lab Results  Component Value Date   CEA1 4.83 02/07/2017     Medications: I have reviewed the  patient's current medications.  Assessment/Plan: 1. 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 ? Loss of MLH1 and PMS2 expression ? Microsatellite instability-high ? CT chest 03/03/2015-negative for metastatic disease ? Cycle 1 adjuvant Xeloda 03/18/2015 ? Cycle 2 adjuvant Xeloda 04/09/2015 ? Cycle 3 adjuvant Xeloda 04/30/2015 ? Cycle 4 adjuvant Xeloda 05/21/2015 ? Cycle 5 adjuvant Xeloda 06/11/2015 (dose reduced due to desquamation of feet). ? Cycle 6 adjuvant Xeloda 07/02/2015 ? Cycle 7 adjuvant Xeloda 07/23/2015 ? Cycle 8 adjuvant Xeloda 08/13/2015 ? CTs chest, abdomen, and pelvis 02/06/2016-cirrhosis, heterogenous enhancement of the liver, small hypoenhancing lesions in the liver-? 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 Likely regenerative nodules in the liver;does not appear as HCCor metastases. Left renal mass very slow growing. CT scan recommended in one year) ? Colonoscopy 08/06/2016-multiple 5-15 mm very subtle sessile serrated adenomasfound in the transverse colon and at the anastomosis. Next colonoscopy at a one-year interval.  2. Anemia, microcytic. Likely iron deficiency from previous GI blood loss. Oral iron initiated 03/17/2015. Improved. 3. 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 She is followed by Dr. Alyson Ingles, urology.Unchanged on CT 02/06/2016, unchanged on MRI 12/06/2016 4. Diabetes 5.  Hypertension 6. History of mild hand/foot syndrome secondary to Xeloda 7. Enlarging segment 8 liver lesion noted on MRI 12/06/2016-felt to potentially represent a metastasis or HCC  Ultrasound-guided biopsy of the lesion on 02/06/2017  revealed cirrhosis, no malignancy 8. Cirrhosis   Disposition:  Ms. Lague appears well. The biopsy of the right liver lesion returned benign. She appears to have cirrhosis with nodular change.  The plan is to continue observation with regard to the history of colon cancer. She will return for an office visit and CEA in 4 months.  15 minutes were spent with the patient today. The majority of the time was used for counseling and coordination of care.  Donneta Romberg, MD  02/12/2017  12:09 PM

## 2017-02-12 NOTE — Telephone Encounter (Signed)
appts made and avs printed for patient 

## 2017-02-25 ENCOUNTER — Other Ambulatory Visit: Payer: Self-pay | Admitting: Family Medicine

## 2017-02-25 DIAGNOSIS — Z1231 Encounter for screening mammogram for malignant neoplasm of breast: Secondary | ICD-10-CM

## 2017-04-02 ENCOUNTER — Ambulatory Visit
Admission: RE | Admit: 2017-04-02 | Discharge: 2017-04-02 | Disposition: A | Discharge status: Home / Self Care | Payer: Medicare Other | Source: Ambulatory Visit | Attending: Family Medicine | Admitting: Family Medicine

## 2017-04-02 DIAGNOSIS — Z1231 Encounter for screening mammogram for malignant neoplasm of breast: Secondary | ICD-10-CM

## 2017-04-03 ENCOUNTER — Other Ambulatory Visit: Payer: Self-pay | Admitting: Family Medicine

## 2017-04-03 DIAGNOSIS — R928 Other abnormal and inconclusive findings on diagnostic imaging of breast: Secondary | ICD-10-CM

## 2017-04-10 ENCOUNTER — Other Ambulatory Visit: Payer: Self-pay | Admitting: Family Medicine

## 2017-04-10 ENCOUNTER — Ambulatory Visit
Admission: RE | Admit: 2017-04-10 | Discharge: 2017-04-10 | Disposition: A | Discharge status: Home / Self Care | Payer: Medicare Other | Source: Ambulatory Visit | Attending: Family Medicine | Admitting: Family Medicine

## 2017-04-10 DIAGNOSIS — N6310 Unspecified lump in the right breast, unspecified quadrant: Secondary | ICD-10-CM

## 2017-04-10 DIAGNOSIS — R928 Other abnormal and inconclusive findings on diagnostic imaging of breast: Secondary | ICD-10-CM

## 2017-04-29 ENCOUNTER — Encounter: Payer: Self-pay | Admitting: Interventional Radiology

## 2017-06-27 ENCOUNTER — Telehealth: Payer: Self-pay | Admitting: Oncology

## 2017-06-27 ENCOUNTER — Other Ambulatory Visit (HOSPITAL_BASED_OUTPATIENT_CLINIC_OR_DEPARTMENT_OTHER): Payer: Medicare Other

## 2017-06-27 ENCOUNTER — Telehealth: Payer: Self-pay | Admitting: Emergency Medicine

## 2017-06-27 ENCOUNTER — Ambulatory Visit: Payer: Medicare Other | Admitting: Oncology

## 2017-06-27 ENCOUNTER — Other Ambulatory Visit: Payer: Self-pay | Admitting: Emergency Medicine

## 2017-06-27 VITALS — BP 174/70 | HR 65 | Temp 98.2°F | Resp 18 | Ht 68.0 in | Wt 222.9 lb

## 2017-06-27 DIAGNOSIS — K7460 Unspecified cirrhosis of liver: Secondary | ICD-10-CM | POA: Diagnosis not present

## 2017-06-27 DIAGNOSIS — C180 Malignant neoplasm of cecum: Secondary | ICD-10-CM | POA: Diagnosis not present

## 2017-06-27 DIAGNOSIS — C189 Malignant neoplasm of colon, unspecified: Secondary | ICD-10-CM

## 2017-06-27 LAB — CEA (IN HOUSE-CHCC): CEA (CHCC-In House): 5.42 ng/mL — ABNORMAL HIGH (ref 0.00–5.00)

## 2017-06-27 NOTE — Progress Notes (Signed)
  [place] OFFICE PROGRESS NOTE   Diagnosis: Colon cancer  INTERVAL HISTORY:   Cheryl Jordan returns as scheduled.  She feels well.  Good appetite.  She has intermittent upper abdominal pain.  She wonders whether this is related to reflux.  She is scheduled for colonoscopy in January.  Objective:  Vital signs in last 24 hours:  Blood pressure (!) 174/70, pulse 65, temperature 98.2 F (36.8 C), temperature source Oral, resp. rate 18, height '5\' 8"'$  (1.727 m), weight 222 lb 14.4 oz (101.1 kg), SpO2 95 %.    HEENT: Neck without mass Lymphatics: No cervical, supraclavicular, axillary, or inguinal nodes Resp: Lungs with end inspiratory rhonchi at the posterior base bilaterally, no respiratory distress Cardio: Regular rate and rhythm GI: No hepatomegaly, no mass, nontender.  No splenomegaly. Vascular: No leg edema   Lab Results:  Lab Results  Component Value Date   WBC 5.2 02/07/2017   HGB 13.0 02/07/2017   HCT 40.0 02/07/2017   MCV 96.4 02/07/2017   PLT 103 (L) 02/07/2017   NEUTROABS 2.7 02/07/2017     Medications: I have reviewed the patient's current medications.  Assessment/Plan: 1. 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 ? Loss of MLH1 and PMS2 expression ? Microsatellite instability-high ? CT chest 03/03/2015-negative for metastatic disease ? Cycle 1 adjuvant Xeloda 03/18/2015 ? Cycle 2 adjuvant Xeloda 04/09/2015 ? Cycle 3 adjuvant Xeloda 04/30/2015 ? Cycle 4 adjuvant Xeloda 05/21/2015 ? Cycle 5 adjuvant Xeloda 06/11/2015 (dose reduced due to desquamation of feet). ? Cycle 6 adjuvant Xeloda 07/02/2015 ? Cycle 7 adjuvant Xeloda 07/23/2015 ? Cycle 8 adjuvant Xeloda 08/13/2015 ? CTs chest,  abdomen, and pelvis 02/06/2016-cirrhosis, heterogenous enhancement of the liver, small hypoenhancing lesions in the liver-? 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 Likely regenerative nodules in the liver;does not appear as HCCor metastases. Left renal mass very slow growing. CT scan recommended in one year) ? Colonoscopy 08/06/2016-multiple 5-15 mm very subtle sessile serrated adenomasfound in the transverse colon and at the anastomosis. Next colonoscopy at a one-year interval.  2. Anemia, microcytic. Likely iron deficiency from previous GI blood loss. Oral iron initiated 03/17/2015. Improved. 3. 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 She is followed by Dr. Alyson Jordan, urology.Unchanged on CT 02/06/2016, unchanged on MRI 12/06/2016 4. Diabetes 5. Hypertension 6. History of mild hand/foot syndrome secondary to Xeloda 7. Enlarging segment 8 liver lesion noted on MRI 12/06/2016-felt to potentially represent a metastasis or HCC  Ultrasound-guided biopsy of the lesion on 02/06/2017 revealed cirrhosis, no malignancy 8. Cirrhosis 9. 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  We will follow-up on the CEA from today.  She will return for an office and CTs/lab visit in 6 months.  We will check a CBC and AFP when she returns in 6 months.  She will see Dr. Amedeo Jordan for a surveillance colonoscopy and management of cirrhosis.  Cheryl Coder, MD  06/27/2017  12:25 PM

## 2017-06-27 NOTE — Telephone Encounter (Signed)
Patient returned call.  Previous call information provided.

## 2017-06-27 NOTE — Telephone Encounter (Addendum)
Called patient and left VM to call back regarding this note. CEA ordered and scheduling message sent for lab appt. CT ordered by Dr.Sherrill  ----- Message from Ladell Pier, MD sent at 06/27/2017  1:36 PM EST ----- Please call patient, cea mildly elevated- just above normal range, has been at upper end of normal in the past.  Repeat CEA in 3 months, CTs 6 months

## 2017-06-27 NOTE — Telephone Encounter (Signed)
Gave patient avs and calendar with appts per 12/13 los.

## 2017-07-02 ENCOUNTER — Telehealth: Payer: Self-pay | Admitting: Oncology

## 2017-07-02 NOTE — Telephone Encounter (Signed)
Left message for patient regarding upcoming March appointments per 12/13 sch message.

## 2017-09-26 ENCOUNTER — Inpatient Hospital Stay: Payer: Medicare Other | Attending: Oncology

## 2017-09-26 DIAGNOSIS — C189 Malignant neoplasm of colon, unspecified: Secondary | ICD-10-CM | POA: Insufficient documentation

## 2017-09-26 LAB — CBC WITH DIFFERENTIAL/PLATELET
BASOS ABS: 0 10*3/uL (ref 0.0–0.1)
Basophils Relative: 0 %
Eosinophils Absolute: 0.1 10*3/uL (ref 0.0–0.5)
Eosinophils Relative: 1 %
HEMATOCRIT: 38.4 % (ref 34.8–46.6)
Hemoglobin: 12.7 g/dL (ref 11.6–15.9)
LYMPHS PCT: 36 %
Lymphs Abs: 1.6 10*3/uL (ref 0.9–3.3)
MCH: 30.9 pg (ref 25.1–34.0)
MCHC: 33 g/dL (ref 31.5–36.0)
MCV: 93.7 fL (ref 79.5–101.0)
Monocytes Absolute: 0.2 10*3/uL (ref 0.1–0.9)
Monocytes Relative: 4 %
NEUTROS ABS: 2.5 10*3/uL (ref 1.5–6.5)
NEUTROS PCT: 59 %
Platelets: 106 10*3/uL — ABNORMAL LOW (ref 145–400)
RBC: 4.1 MIL/uL (ref 3.70–5.45)
RDW: 14.4 % (ref 11.2–14.5)
WBC: 4.3 10*3/uL (ref 3.9–10.3)

## 2017-09-26 LAB — CEA (IN HOUSE-CHCC): CEA (CHCC-In House): 5.14 ng/mL — ABNORMAL HIGH (ref 0.00–5.00)

## 2017-10-01 ENCOUNTER — Telehealth: Payer: Self-pay | Admitting: *Deleted

## 2017-10-01 NOTE — Telephone Encounter (Signed)
Notified pt of CEA result. Instructed her to call office if she is not contacted with June CT appt.

## 2017-10-01 NOTE — Telephone Encounter (Signed)
-----   Message from Ladell Pier, MD sent at 09/30/2017  6:53 PM EDT ----- Please call patient, CEA is at the upper end of normal and stable, follow-up for a repeat CEA and restaging CTs as scheduled in 3 months

## 2017-10-09 ENCOUNTER — Ambulatory Visit
Admission: RE | Admit: 2017-10-09 | Discharge: 2017-10-09 | Disposition: A | Discharge status: Home / Self Care | Payer: Medicare Other | Source: Ambulatory Visit | Attending: Family Medicine | Admitting: Family Medicine

## 2017-10-09 DIAGNOSIS — N6310 Unspecified lump in the right breast, unspecified quadrant: Secondary | ICD-10-CM

## 2017-11-16 ENCOUNTER — Observation Stay (HOSPITAL_COMMUNITY): Payer: Medicare Other

## 2017-11-16 ENCOUNTER — Observation Stay (HOSPITAL_COMMUNITY)
Admission: EM | Admit: 2017-11-16 | Discharge: 2017-11-17 | Disposition: A | Discharge status: Home / Self Care | Payer: Medicare Other | Attending: Internal Medicine | Admitting: Internal Medicine

## 2017-11-16 ENCOUNTER — Encounter (HOSPITAL_COMMUNITY): Payer: Self-pay

## 2017-11-16 ENCOUNTER — Other Ambulatory Visit: Payer: Self-pay

## 2017-11-16 ENCOUNTER — Emergency Department (HOSPITAL_COMMUNITY): Payer: Medicare Other

## 2017-11-16 DIAGNOSIS — E119 Type 2 diabetes mellitus without complications: Secondary | ICD-10-CM | POA: Insufficient documentation

## 2017-11-16 DIAGNOSIS — R112 Nausea with vomiting, unspecified: Secondary | ICD-10-CM

## 2017-11-16 DIAGNOSIS — Z79899 Other long term (current) drug therapy: Secondary | ICD-10-CM | POA: Diagnosis not present

## 2017-11-16 DIAGNOSIS — E039 Hypothyroidism, unspecified: Secondary | ICD-10-CM | POA: Diagnosis not present

## 2017-11-16 DIAGNOSIS — M109 Gout, unspecified: Secondary | ICD-10-CM | POA: Diagnosis not present

## 2017-11-16 DIAGNOSIS — Z885 Allergy status to narcotic agent status: Secondary | ICD-10-CM | POA: Insufficient documentation

## 2017-11-16 DIAGNOSIS — K219 Gastro-esophageal reflux disease without esophagitis: Secondary | ICD-10-CM | POA: Diagnosis not present

## 2017-11-16 DIAGNOSIS — Z886 Allergy status to analgesic agent status: Secondary | ICD-10-CM | POA: Diagnosis not present

## 2017-11-16 DIAGNOSIS — I10 Essential (primary) hypertension: Secondary | ICD-10-CM | POA: Insufficient documentation

## 2017-11-16 DIAGNOSIS — R0789 Other chest pain: Secondary | ICD-10-CM | POA: Diagnosis not present

## 2017-11-16 DIAGNOSIS — Z7984 Long term (current) use of oral hypoglycemic drugs: Secondary | ICD-10-CM | POA: Insufficient documentation

## 2017-11-16 DIAGNOSIS — R110 Nausea: Secondary | ICD-10-CM | POA: Diagnosis present

## 2017-11-16 DIAGNOSIS — Z85038 Personal history of other malignant neoplasm of large intestine: Secondary | ICD-10-CM | POA: Diagnosis not present

## 2017-11-16 DIAGNOSIS — A084 Viral intestinal infection, unspecified: Secondary | ICD-10-CM | POA: Insufficient documentation

## 2017-11-16 DIAGNOSIS — R079 Chest pain, unspecified: Secondary | ICD-10-CM

## 2017-11-16 LAB — GLUCOSE, CAPILLARY
Glucose-Capillary: 129 mg/dL — ABNORMAL HIGH (ref 65–99)
Glucose-Capillary: 141 mg/dL — ABNORMAL HIGH (ref 65–99)

## 2017-11-16 LAB — BASIC METABOLIC PANEL
ANION GAP: 13 (ref 5–15)
BUN: 17 mg/dL (ref 6–20)
CHLORIDE: 104 mmol/L (ref 101–111)
CO2: 23 mmol/L (ref 22–32)
Calcium: 9.2 mg/dL (ref 8.9–10.3)
Creatinine, Ser: 0.98 mg/dL (ref 0.44–1.00)
GFR calc Af Amer: >60 mL/min (ref >60)
GFR calc non Af Amer: 55 mL/min — ABNORMAL LOW (ref >60)
Glucose, Bld: 208 mg/dL — ABNORMAL HIGH (ref 65–99)
POTASSIUM: 4.1 mmol/L (ref 3.5–5.1)
Sodium: 140 mmol/L (ref 135–145)

## 2017-11-16 LAB — CBC
HEMATOCRIT: 42.2 % (ref 36.0–46.0)
HEMOGLOBIN: 14 g/dL (ref 12.0–15.0)
MCH: 31.1 pg (ref 26.0–34.0)
MCHC: 33.2 g/dL (ref 30.0–36.0)
MCV: 93.8 fL (ref 78.0–100.0)
Platelets: 109 10*3/uL — ABNORMAL LOW (ref 150–400)
RBC: 4.5 MIL/uL (ref 3.87–5.11)
RDW: 14 % (ref 11.5–15.5)
WBC: 6.7 10*3/uL (ref 4.0–10.5)

## 2017-11-16 LAB — I-STAT TROPONIN, ED: Troponin i, poc: 0 ng/mL (ref 0.00–0.08)

## 2017-11-16 LAB — TROPONIN I
Troponin I: <0.03 ng/mL (ref <0.03)
Troponin I: <0.03 ng/mL (ref <0.03)

## 2017-11-16 LAB — BRAIN NATRIURETIC PEPTIDE: B NATRIURETIC PEPTIDE 5: 79.2 pg/mL (ref 0.0–100.0)

## 2017-11-16 LAB — CBG MONITORING, ED: GLUCOSE-CAPILLARY: 210 mg/dL — AB (ref 65–99)

## 2017-11-16 MED ORDER — DIPHENHYDRAMINE HCL 50 MG/ML IJ SOLN
25.0000 mg | Freq: Once | INTRAMUSCULAR | Status: AC
  Administered 2017-11-16: 25 mg via INTRAVENOUS
  Filled 2017-11-16: qty 1

## 2017-11-16 MED ORDER — ONDANSETRON HCL 4 MG/2ML IJ SOLN
4.0000 mg | Freq: Once | INTRAMUSCULAR | Status: AC
  Administered 2017-11-16: 4 mg via INTRAVENOUS
  Filled 2017-11-16: qty 2

## 2017-11-16 MED ORDER — NITROGLYCERIN 0.4 MG SL SUBL: 0.4000 mg | SUBLINGUAL_TABLET | SUBLINGUAL | Status: DC | PRN

## 2017-11-16 MED ORDER — GI COCKTAIL ~~LOC~~
30.0000 mL | Freq: Once | ORAL | Status: AC
  Administered 2017-11-16: 30 mL via ORAL
  Filled 2017-11-16: qty 30

## 2017-11-16 MED ORDER — HYDRALAZINE HCL 20 MG/ML IJ SOLN: 10.0000 mg | Freq: Three times a day (TID) | INTRAMUSCULAR | Status: DC | PRN

## 2017-11-16 MED ORDER — GI COCKTAIL ~~LOC~~: 30.0000 mL | Freq: Four times a day (QID) | ORAL | Status: DC | PRN

## 2017-11-16 MED ORDER — ENOXAPARIN SODIUM 40 MG/0.4ML ~~LOC~~ SOLN
40.0000 mg | SUBCUTANEOUS | Status: DC
  Administered 2017-11-16: 40 mg via SUBCUTANEOUS
  Filled 2017-11-16: qty 0.4

## 2017-11-16 MED ORDER — SODIUM CHLORIDE 0.9 % IV SOLN
INTRAVENOUS | Status: AC
  Administered 2017-11-16 – 2017-11-17 (×2): via INTRAVENOUS

## 2017-11-16 MED ORDER — ACETAMINOPHEN 325 MG PO TABS: 650.0000 mg | ORAL_TABLET | ORAL | Status: DC | PRN

## 2017-11-16 MED ORDER — ZOLPIDEM TARTRATE 5 MG PO TABS: 5.0000 mg | ORAL_TABLET | Freq: Every evening | ORAL | Status: DC | PRN

## 2017-11-16 MED ORDER — SODIUM CHLORIDE 0.9 % IV BOLUS
500.0000 mL | Freq: Once | INTRAVENOUS | Status: AC
  Administered 2017-11-16: 500 mL via INTRAVENOUS

## 2017-11-16 MED ORDER — INSULIN ASPART 100 UNIT/ML ~~LOC~~ SOLN
0.0000 [IU] | Freq: Three times a day (TID) | SUBCUTANEOUS | Status: DC
  Administered 2017-11-17: 3 [IU] via SUBCUTANEOUS
  Administered 2017-11-17: 2 [IU] via SUBCUTANEOUS

## 2017-11-16 MED ORDER — ONDANSETRON HCL 4 MG/2ML IJ SOLN: 4.0000 mg | Freq: Four times a day (QID) | INTRAMUSCULAR | Status: DC | PRN

## 2017-11-16 NOTE — ED Notes (Signed)
Pt is alert and oriented x 4 and is verbally responsive. Pt reports mid-sternal chest pain 6/10 with associated n/v. Pt is being cardiac monitoring. Pt sister and husband are present in the room.

## 2017-11-16 NOTE — ED Triage Notes (Signed)
PT C/O CHEST PAIN WITH N/V SINCE 0830 TODAY. PT STS SHE HAD A DRY COUGH X2 DAYS, AND TOOK COUGH SYRUP WITH CODEINE LAST NIGHT. AN HOUR AND HALF AFTER TAKING THE SYRUP SHE STARTED TO FEEL JITTERY AND "WEIRD". PT STS SHE IS ALLERGIC TO CODEINE, AS WELL. PT DENIED ITCHING, HIVES, OR DIFFICULTY BREATHING.

## 2017-11-16 NOTE — ED Notes (Signed)
ED TO INPATIENT HANDOFF REPORT  Name/Age/Gender Cheryl Jordan 77 y.o. female  Code Status Code Status History    Date Active Date Inactive Code Status Order ID Comments User Context   02/10/2015 1638 02/16/2015 1335 Full Code [id number]  [name], MD Inpatient   10/16/2011 1101 10/18/2011 1643 Full Code [id number]  [name], RN Inpatient      Home/SNF/Other Home  Chief Complaint has been sick, chest pain  Level of Care/Admitting Diagnosis ED Disposition    ED Disposition Condition Comment   [facility]: [facility] [[id number]]  Level of Care: Telemetry [5]  Admit to tele based on following criteria: Monitor for Ischemic changes  Diagnosis: Chest pain [938101]  Admitting Physician: [name] [[id number]]  Attending Physician: [name] [[id number]]  PT Class (Do Not Modify): Observation [104]  PT Acc Code (Do Not Modify): Observation [10022]       Medical History Past Medical History:  Diagnosis Date  . Anemia    hx of  . Arthritis   . Colon cancer ([place]) dx'd 11/2014  . Diabetes mellitus without complication ([place])    TYPE II  . GERD (gastroesophageal reflux disease)   . History of gout   . Hypertension   . Hypothyroidism   . PONV (postoperative nausea and vomiting)     Allergies Allergies  Allergen Reactions  . Aspirin Other (See Comments)    Runny nose   . Codeine Nausea And Vomiting    IV Location/Drains/Wounds Patient Lines/Drains/Airways Status   Active Line/Drains/Airways    Name:   Placement date:   Placement time:   Site:   Days:   Peripheral IV 11/23/14   11/23/14    1155    -   1089   Peripheral IV 11/16/17 Left Antecubital   11/16/17    1213    Antecubital   less than 1   Incision (Closed) 02/10/15 Abdomen Other (Comment)   02/10/15    1437     1010          Labs/Imaging Results for orders placed or performed during the hospital encounter of 11/16/17 (from the past 48 hour(s))  CBG monitoring,  ED     Status: Abnormal   Collection Time: 11/16/17 11:35 AM  Result Value Ref Range   Glucose-Capillary 210 (H) 65 - 99 mg/dL  Basic metabolic panel     Status: Abnormal   Collection Time: 11/16/17 12:12 PM  Result Value Ref Range   Sodium 140 135 - 145 mmol/L   Potassium 4.1 3.5 - 5.1 mmol/L   Chloride 104 101 - 111 mmol/L   CO2 23 22 - 32 mmol/L   Glucose, Bld 208 (H) 65 - 99 mg/dL   BUN 17 6 - 20 mg/dL   Creatinine, Ser 0.98 0.44 - 1.00 mg/dL   Calcium 9.2 8.9 - 10.3 mg/dL   GFR calc non Af Amer 55 (L) >60 mL/min   GFR calc Af Amer >60 >60 mL/min    Comment: (NOTE) The eGFR has been calculated using the CKD EPI equation. This calculation has not been validated in all clinical situations. eGFR's persistently <60 mL/min signify possible Chronic Kidney Disease.    Anion gap 13 5 - 15    Comment: Performed at [facility], [place] [address], [place], [place] [address]  CBC     Status: Abnormal   Collection Time: 11/16/17 12:12 PM  Result Value Ref Range   WBC 6.7  4.0 - 10.5 K/uL   RBC 4.50 3.87 - 5.11 MIL/uL   Hemoglobin 14.0 12.0 - 15.0 g/dL   HCT 42.2 36.0 - 46.0 %   MCV 93.8 78.0 - 100.0 fL   MCH 31.1 26.0 - 34.0 pg   MCHC 33.2 30.0 - 36.0 g/dL   RDW 14.0 11.5 - 15.5 %   Platelets 109 (L) 150 - 400 K/uL    Comment: REPEATED TO VERIFY SPECIMEN CHECKED FOR CLOTS PLATELET COUNT CONFIRMED BY SMEAR Performed at [facility], [place] [address] [place], [place] [address]   I-stat troponin, ED     Status: None   Collection Time: 11/16/17 12:20 PM  Result Value Ref Range   Troponin i, poc 0.00 0.00 - 0.08 ng/mL   Comment 3            Comment: Due to the release kinetics of cTnI, a negative result within the first hours of the onset of symptoms does not rule out myocardial infarction with certainty. If myocardial infarction is still suspected, repeat the test at appropriate intervals.    Dg Chest 2 View  Result Date:  11/16/2017 CLINICAL DATA:  77 year old female with history of productive cough for the past 2 days. Chest pain today. EXAM: CHEST - 2 VIEW COMPARISON:  No prior chest x-ray.  Chest CT 01/24/2017. FINDINGS: Lung volumes are low. No acute consolidative airspace disease. No pleural effusions. No evidence of pulmonary edema. No suspicious appearing pulmonary nodules or masses. Mild cardiomegaly. Upper mediastinal contours are within normal limits. IMPRESSION: 1. No radiographic evidence of acute pulmonary disease. 2. Mild cardiomegaly. Electronically Signed   By: Vinnie Langton M.D.   On: 11/16/2017 12:06    Pending Labs Unresulted Labs (From admission, onward)   None      Vitals/Pain Today's Vitals   11/16/17 1311 11/16/17 1313 11/16/17 1527 11/16/17 1530  BP:  (!) 151/62 (!) 147/70   Pulse: (!) 58 64 (!) 59 60  Resp: 17 19 16 20   Temp:   98 F (36.7 C)   TempSrc:   Oral   SpO2: 98% 97% 93% 96%  Weight:      Height:      PainSc:        Isolation Precautions No active isolations  Medications Medications  gi cocktail (Maalox,Lidocaine,Donnatal) (has no administration in time range)  ondansetron (ZOFRAN) injection 4 mg (has no administration in time range)  diphenhydrAMINE (BENADRYL) injection 25 mg (has no administration in time range)  ondansetron (ZOFRAN) injection 4 mg (4 mg Intravenous Given 11/16/17 1311)  sodium chloride 0.9 % bolus 500 mL (0 mLs Intravenous Stopped 11/16/17 1420)    Mobility walks

## 2017-11-16 NOTE — ED Provider Notes (Addendum)
[facility] DEPT Provider Note   CSN: [id number] Arrival date & time: 11/16/17  1120     History   Chief Complaint Chief Complaint  Patient presents with  . Chest Pain    HPI Cheryl Jordan is a 77 y.o. female.  HPI Cheryl Jordan is a 77 y.o. female with history of colon cancer, anemia, diabetes, hypertension, hypothyroidism, presents to emergency department with complaint of chest pressure, nausea, vomiting.  Patient states she had a cough yesterday.  She took Tussionex cough syrup which she had leftover from prior use.  She states shortly after that she became nauseated and dizzy.  She states she was unable to eat dinner, states felt weak, and went to bed.  This morning when she woke up she continued to feel nauseated and had an episode of emesis.  She reports that she had associated chest pressure, dizziness, weakness.  She did not eat breakfast and was unable to take her medications because she is been feeling so bad.  Currently, she denies any chest pain but states she is still nauseated and dizzy and weak.  She denies any cardiac history.  She denies any swelling in extremities.  No recent travel surgeries.  She states she has not been coughing today.  Past Medical History:  Diagnosis Date  . Anemia    hx of  . Arthritis   . Colon cancer ([place]) dx'd 11/2014  . Diabetes mellitus without complication ([place])    TYPE II  . GERD (gastroesophageal reflux disease)   . History of gout   . Hypertension   . Hypothyroidism   . PONV (postoperative nausea and vomiting)     Patient Active Problem List   Diagnosis Date Noted  . Adenocarcinoma of colon ([place]) 01/05/2015  . ARF (acute renal failure) ([place]) 10/16/2011  . Viral gastroenteritis 10/16/2011  . HTN (hypertension) 10/16/2011  . Hypothyroidism 10/16/2011  . GERD (gastroesophageal reflux disease) 10/16/2011  . Glucose intolerance (impaired glucose tolerance) 10/16/2011    Past Surgical History:    Procedure Laterality Date  . CHOLECYSTECTOMY    . IR RADIOLOGIST EVAL & MGMT  01/24/2017  . KNEE SURGERY    . NEPHRECTOMY Left 02/10/2015   Procedure: OPEN RETROPERINTONEAL EXPLORATION LEFT RENAL  CYST DECORTICATION X 5;  Surgeon: Cleon Gustin, MD;  Location: [facility] ORS;  Service: Urology;  Laterality: Left;  . PARTIAL COLECTOMY N/A 02/10/2015   Procedure: OPEN RIGHT  COLECTOMY ;  Surgeon: [name], MD;  Location: [facility] ORS;  Service: General;  Laterality: N/A;  . TUBAL LIGATION       OB History   None      Home Medications    Prior to Admission medications   Medication Sig Start Date End Date Taking? Authorizing Provider  amLODipine (NORVASC) 5 MG tablet Take 5 mg by mouth every morning.     [provider]  atenolol (TENORMIN) 50 MG tablet Take 50 mg by mouth every morning.     [provider]  glipiZIDE (GLUCOTROL XL) 5 MG 24 hr tablet Take 5 mg by mouth daily with breakfast.    [provider]  levothyroxine (SYNTHROID, LEVOTHROID) 88 MCG tablet Take 88 mcg by mouth daily.    [provider]  LORazepam (ATIVAN) 0.5 MG tablet Take 0.5 mg by mouth every 8 (eight) hours as needed. for anxiety 12/04/16   [provider]  losartan (COZAAR) 50 MG tablet Take 25 mg by mouth every morning.  [provider]  omeprazole (PRILOSEC) 20 MG capsule Take 20 mg by mouth daily. 12/03/16   [provider]    Family History Family History  Problem Relation Age of Onset  . Diabetes Mother   . Hypertension Other     Social History Social History   Tobacco Use  . Smoking status: Never Smoker  . Smokeless tobacco: Never Used  Substance Use Topics  . Alcohol use: No  . Drug use: No     Allergies   Aspirin and Codeine   Review of Systems Review of Systems  Constitutional: Positive for fatigue. Negative for chills and fever.  Respiratory: Positive for chest tightness. Negative for cough and shortness of breath.    Cardiovascular: Negative for chest pain, palpitations and leg swelling.  Gastrointestinal: Positive for nausea and vomiting. Negative for abdominal pain and diarrhea.  Genitourinary: Negative for dysuria, flank pain, pelvic pain, vaginal bleeding, vaginal discharge and vaginal pain.  Musculoskeletal: Negative for arthralgias, myalgias, neck pain and neck stiffness.  Skin: Negative for rash.  Neurological: Positive for dizziness, weakness and light-headedness. Negative for headaches.  All other systems reviewed and are negative.    Physical Exam Updated Vital Signs BP (!) 171/85 (BP Location: Left Arm)   Pulse 67   Temp 98.3 F (36.8 C) (Oral)   Resp 15   Ht 5\' 8"  (1.727 m)   Wt 98.4 kg (217 lb)   SpO2 98%   BMI 32.99 kg/m   Physical Exam  Constitutional: She appears well-developed and well-nourished. No distress.  HENT:  Head: Normocephalic.  Eyes: Conjunctivae are normal.  Neck: Neck supple.  Cardiovascular: Normal rate, regular rhythm and normal heart sounds.  Pulmonary/Chest: Effort normal and breath sounds normal. No respiratory distress. She has no wheezes. She has no rales.  Abdominal: Soft. Bowel sounds are normal. She exhibits no distension. There is no tenderness. There is no rebound.  Musculoskeletal: She exhibits no edema.  Neurological: She is alert.  Skin: Skin is warm and dry.  Psychiatric: She has a normal mood and affect. Her behavior is normal.  Nursing note and vitals reviewed.    ED Treatments / Results  Labs (all labs ordered are listed, but only abnormal results are displayed) Labs Reviewed  BASIC METABOLIC PANEL - Abnormal; Notable for the following components:      Result Value   Glucose, Bld 208 (*)    GFR calc non Af Amer 55 (*)    All other components within normal limits  CBC - Abnormal; Notable for the following components:   Platelets 109 (*)    All other components within normal limits  CBG MONITORING, ED - Abnormal; Notable for the  following components:   Glucose-Capillary 210 (*)    All other components within normal limits  I-STAT TROPONIN, ED    EKG EKG Interpretation  Date/Time:  Saturday Nov 16 2017 12:00:02 EDT Ventricular Rate:  67 PR Interval:    QRS Duration: 84 QT Interval:  428 QTC Calculation: 452 R Axis:   5 Text Interpretation:  Sinus rhythm No STEMI.  Confirmed by Nanda Quinton [phone]) on 11/16/2017 12:03:53 PM   Radiology Dg Chest 2 View  Result Date: 11/16/2017 CLINICAL DATA:  77 year old female with history of productive cough for the past 2 days. Chest pain today. EXAM: CHEST - 2 VIEW COMPARISON:  No prior chest x-ray.  Chest CT 01/24/2017. FINDINGS: Lung volumes are low. No acute consolidative airspace disease. No pleural effusions. No evidence of pulmonary edema.  No suspicious appearing pulmonary nodules or masses. Mild cardiomegaly. Upper mediastinal contours are within normal limits. IMPRESSION: 1. No radiographic evidence of acute pulmonary disease. 2. Mild cardiomegaly. Electronically Signed   By: Vinnie Langton M.D.   On: 11/16/2017 12:06    Procedures Procedures (including critical care time)  Medications Ordered in ED Medications  ondansetron (ZOFRAN) injection 4 mg (has no administration in time range)  sodium chloride 0.9 % bolus 500 mL (has no administration in time range)     Initial Impression / Assessment and Plan / ED Course  I have reviewed the triage vital signs and the nursing notes.  Pertinent labs & imaging results that were available during my care of the patient were reviewed by me and considered in my medical decision making (see chart for details).     Patient in emergency department with some nausea, vomiting, dizziness, lightheadedness, weakness, and episode of chest pressure that was earlier this morning.  Currently chest pain-free.  Will check labs, EKG, chest x-ray, will give Zofran for nausea and vomiting.  PT allergic to aspirin. Patient is hypertensive,  otherwise normal vital signs.  She did not take her medications this morning   2:04 PM Initial troponin negative.  Chest x-ray and EKG normal.  Patient's heart score is 4, with 2 points given for her age and 2 points for greater than or equal to 3 risk factors including hypertension, diabetes, obesity.  Patient is chest pain-free at this time.  I will admit her for observation and repeat troponin.   Spoke with medicine, will admit.   Vitals:   11/16/17 1132 11/16/17 1133 11/16/17 1311 11/16/17 1313  BP: (!) 171/85   (!) 151/62  Pulse: 67  (!) 58 64  Resp: 15  17 19   Temp: 98.3 F (36.8 C)     TempSrc: Oral     SpO2: 98%  98% 97%  Weight:  98.4 kg (217 lb)    Height:  5\' 8"  (1.727 m)       Final Clinical Impressions(s) / ED Diagnoses   Final diagnoses:  Chest pain, unspecified type  Nausea    ED Discharge Orders    None           Jeannett Senior, PA-C 11/16/17 [place], [facility], MD 11/16/17 1824

## 2017-11-16 NOTE — H&P (Signed)
[facility] History and Physical  RYLYNNE SCHICKER GMW:[id number] DOB: [birth year] DOA: 11/16/2017  Referring physician:  PCP: Dione Housekeeper, MD   Chief Complaint: Nausea  HPI: SARAH-JANE NAZARIO is a 77 y.o. female with past medical history significant for reflux, hypertension, low thyroid, colon cancer who is still under the care of oncology presents the emergency room with complaint of nausea.  Patient states she took some hydrocodone syrup for cough.  Afterwards had violent nausea with some vomiting.  Vomiting has ceased but continues to be nauseous.  Developed some chest discomfort after the vomiting.  Pain radiated up the chest described as a pressure.  Some diaphoresis while this was going on.  Patient has no history of heart attack.  No recent upper respiratory tract illness.  No suspicious ingestions.  No recent medication changes.  ED course: Troponin checked.  Chest x-ray negative.  Negative opponent.  Hospitalist consulted for chest pain admission.   Review of Systems:  As per HPI otherwise 10 point review of systems negative.    Past Medical History:  Diagnosis Date  . Anemia    hx of  . Arthritis   . Colon cancer ([place]) dx'd 11/2014  . Diabetes mellitus without complication ([place])    TYPE II  . GERD (gastroesophageal reflux disease)   . History of gout   . Hypertension   . Hypothyroidism   . PONV (postoperative nausea and vomiting)    Past Surgical History:  Procedure Laterality Date  . CHOLECYSTECTOMY    . IR RADIOLOGIST EVAL & MGMT  01/24/2017  . KNEE SURGERY    . NEPHRECTOMY Left 02/10/2015   Procedure: OPEN RETROPERINTONEAL EXPLORATION LEFT RENAL  CYST DECORTICATION X 5;  Surgeon: Cleon Gustin, MD;  Location: [facility];  Service: Urology;  Laterality: Left;  . PARTIAL COLECTOMY N/A 02/10/2015   Procedure: OPEN RIGHT  COLECTOMY ;  Surgeon: [name], MD;  Location: [facility];  Service: General;  Laterality: N/A;  . TUBAL LIGATION     Social History:  reports that she  has never smoked. She has never used smokeless tobacco. She reports that she does not drink alcohol or use drugs.  Allergies  Allergen Reactions  . Aspirin Other (See Comments)    Runny nose   . Codeine Nausea And Vomiting    Family History  Problem Relation Age of Onset  . Diabetes Mother   . Hypertension Other      Prior to Admission medications   Medication Sig Start Date End Date Taking? Authorizing Provider  acetaminophen (TYLENOL) 325 MG tablet Take 325 mg by mouth every 6 (six) hours as needed for mild pain or headache.   Yes [provider]  amLODipine (NORVASC) 5 MG tablet Take 5 mg by mouth every morning.    Yes [provider]  atenolol (TENORMIN) 50 MG tablet Take 50 mg by mouth every morning.    Yes [provider]  glipiZIDE (GLUCOTROL XL) 5 MG 24 hr tablet Take 5 mg by mouth daily with breakfast.   Yes [provider]  HYDROcodone-homatropine (HYCODAN) 5-1.5 MG/5ML syrup Take 5 mLs by mouth at bedtime as needed for cough.  07/18/17  Yes [provider]  levothyroxine (SYNTHROID, LEVOTHROID) 88 MCG tablet Take 88 mcg by mouth daily.   Yes [provider]  LORazepam (ATIVAN) 0.5 MG tablet Take 0.5 mg by mouth every 8 (eight) hours as needed. for anxiety 12/04/16  Yes [provider]  omeprazole (PRILOSEC) 20 MG  capsule Take 20 mg by mouth daily. 12/03/16  Yes [provider]   Physical Exam: Vitals:   11/16/17 1313 11/16/17 1527 11/16/17 1530 11/16/17 1654  BP: (!) 151/62 (!) 147/70  (!) 188/78  Pulse: 64 (!) 59 60 66  Resp: 19 16 20    Temp:  98 F (36.7 C)  98 F (36.7 C)  TempSrc:  Oral  Oral  SpO2: 97% 93% 96% 96%  Weight:    98.6 kg (217 lb 6 oz)  Height:    5\' 8"  (1.727 m)    Wt Readings from Last 3 Encounters:  11/16/17 98.6 kg (217 lb 6 oz)  06/27/17 101.1 kg (222 lb 14.4 oz)  02/12/17 99.8 kg (220 lb 1.6 oz)    General:  Appears calm and comfortable; a&Ox3 Eyes:  PERRL, EOMI,  normal lids, iris ENT:  grossly normal hearing, lips & tongue Neck:  no LAD, masses or thyromegaly Cardiovascular:  RRR, no m/r/g. No LE edema.  Respiratory:  CTA bilaterally, no w/r/r. Normal respiratory effort. Abdomen:  soft, ntnd Skin:  no rash or induration seen on limited exam Musculoskeletal:  grossly normal tone BUE/BLE Psychiatric:  grossly normal mood and affect, speech fluent and appropriate Neurologic:  CN 2-12 grossly intact, moves all extremities in coordinated fashion.          Labs on Admission:  Basic Metabolic Panel: Recent Labs  Lab 11/16/17 1212  NA 140  K 4.1  CL 104  CO2 23  GLUCOSE 208*  BUN 17  CREATININE 0.98  CALCIUM 9.2   Liver Function Tests: No results for input(s): AST, ALT, ALKPHOS, BILITOT, PROT, ALBUMIN in the last 168 hours. No results for input(s): LIPASE, AMYLASE in the last 168 hours. No results for input(s): AMMONIA in the last 168 hours. CBC: Recent Labs  Lab 11/16/17 1212  WBC 6.7  HGB 14.0  HCT 42.2  MCV 93.8  PLT 109*   Cardiac Enzymes: Recent Labs  Lab 11/16/17 1658  TROPONINI <0.03    BNP (last 3 results) Recent Labs    11/16/17 1658  BNP 79.2    ProBNP (last 3 results) No results for input(s): PROBNP in the last 8760 hours.   Serum creatinine: 0.98 mg/dL 11/16/17 1212 Estimated creatinine clearance: 60.9 mL/min  CBG: Recent Labs  Lab 11/16/17 1135 11/16/17 1707  GLUCAP 210* 129*    Radiological Exams on Admission: Dg Chest 2 View  Result Date: 11/16/2017 CLINICAL DATA:  77 year old female with history of productive cough for the past 2 days. Chest pain today. EXAM: CHEST - 2 VIEW COMPARISON:  No prior chest x-ray.  Chest CT 01/24/2017. FINDINGS: Lung volumes are low. No acute consolidative airspace disease. No pleural effusions. No evidence of pulmonary edema. No suspicious appearing pulmonary nodules or masses. Mild cardiomegaly. Upper mediastinal contours are within normal limits. IMPRESSION: 1. No  radiographic evidence of acute pulmonary disease. 2. Mild cardiomegaly. Electronically Signed   By: Vinnie Langton M.D.   On: 11/16/2017 12:06   Dg Abd 2 Views  Result Date: 11/16/2017 CLINICAL DATA:  Nausea, vomiting. EXAM: ABDOMEN - 2 VIEW COMPARISON:  None. FINDINGS: The bowel gas pattern is normal. There is no evidence of free air. Surgical clips are noted in the left upper quadrant. No radio-opaque calculi or other significant radiographic abnormality is seen. IMPRESSION: No evidence of bowel obstruction or ileus. Electronically Signed   By: Marijo Conception, M.D.   On: 11/16/2017 17:05    EKG: Independently reviewed. No stemi.  NSR.  Assessment/Plan Active Problems:   Chest pain  CP Given hear score of 4 by EDP in ED. - serial trop ordered, initial neg - prn EKG CP - prn ntg cp - asa allergy - echo ordered for AM - tele bed, cardiac monitoring - zofran prn for nausea  Nausea vomiting Likely due to patient's sensitivity to narcotics PRN Zofran a one-time dose of Benadryl X-ray of the abdomen did not show acute findings If patient remains nauseous would consider getting a CT scan due to her history of colon cancer  DM SSI AC  Hypertension When necessary hydralazine 10 mg IV as needed for severe blood pressure   Code Status: FC  DVT Prophylaxis: lovenox Family Communication: husband at bedside Disposition Plan: Pending Improvement  Status: tele obs  [name], MD Family Medicine [facility] [email or web address] Password [email or web address]

## 2017-11-16 NOTE — Progress Notes (Signed)
Pt was lying in bed when I arrived; she was alert and awake. She said she was feeling sleepy. She described how she began feeling ill last night and said she felt like she was going to die. She said she had this dry cough and took some cough medicine she had in January when she had bronchitis. She said about thirty minutes later she started feeling sick with chest pains and nausea. She said she told her husband this morning that she was going to have to go somewhere-and they came to [place]. Pt said she sent her husband home because he is not well. She said she was fine and does not need anything. Please page if assistance is needed. [place], [place]   11/16/17 1700  Clinical Encounter Type  Visited With Patient

## 2017-11-17 ENCOUNTER — Observation Stay (HOSPITAL_BASED_OUTPATIENT_CLINIC_OR_DEPARTMENT_OTHER): Payer: Medicare Other

## 2017-11-17 DIAGNOSIS — R079 Chest pain, unspecified: Secondary | ICD-10-CM

## 2017-11-17 DIAGNOSIS — I10 Essential (primary) hypertension: Secondary | ICD-10-CM | POA: Diagnosis not present

## 2017-11-17 DIAGNOSIS — E039 Hypothyroidism, unspecified: Secondary | ICD-10-CM | POA: Diagnosis not present

## 2017-11-17 DIAGNOSIS — R0789 Other chest pain: Principal | ICD-10-CM

## 2017-11-17 DIAGNOSIS — E119 Type 2 diabetes mellitus without complications: Secondary | ICD-10-CM | POA: Diagnosis not present

## 2017-11-17 LAB — GLUCOSE, CAPILLARY
GLUCOSE-CAPILLARY: 142 mg/dL — AB (ref 65–99)
Glucose-Capillary: 172 mg/dL — ABNORMAL HIGH (ref 65–99)

## 2017-11-17 LAB — BASIC METABOLIC PANEL
ANION GAP: 9 (ref 5–15)
BUN: 16 mg/dL (ref 6–20)
CALCIUM: 8.5 mg/dL — AB (ref 8.9–10.3)
CO2: 24 mmol/L (ref 22–32)
CREATININE: 1.04 mg/dL — AB (ref 0.44–1.00)
Chloride: 108 mmol/L (ref 101–111)
GFR calc Af Amer: 59 mL/min — ABNORMAL LOW (ref >60)
GFR, EST NON AFRICAN AMERICAN: 51 mL/min — AB (ref >60)
Glucose, Bld: 125 mg/dL — ABNORMAL HIGH (ref 65–99)
Potassium: 4 mmol/L (ref 3.5–5.1)
SODIUM: 141 mmol/L (ref 135–145)

## 2017-11-17 LAB — ECHOCARDIOGRAM COMPLETE
HEIGHTINCHES: 68 in
Weight: 3477.98 oz

## 2017-11-17 LAB — TROPONIN I: Troponin I: <0.03 ng/mL (ref <0.03)

## 2017-11-17 NOTE — Progress Notes (Signed)
  Echocardiogram 2D Echocardiogram has been performed.  Bralee Feldt G Renuka Farfan 11/17/2017, 9:05 AM

## 2017-11-17 NOTE — Discharge Summary (Signed)
Discharge Summary  Cheryl Jordan NUU:[id number] DOB: 1940-09-21  PCP: Dione Housekeeper, MD  Admit date: 11/16/2017 Discharge date: 11/17/2017  Time spent: 25 mins   Recommendations for Outpatient Follow-up:  1. PCP  Discharge Diagnoses:  Active Hospital Problems   Diagnosis Date Noted  . Chest pain 11/16/2017    Resolved Hospital Problems  No resolved problems to display.    Discharge Condition: Stable  Diet recommendation: Heart healthy  Vitals:   11/16/17 2159 11/17/17 0403  BP: (!) 141/59 (!) 167/65  Pulse: 71 67  Resp: 20 18  Temp: 98 F (36.7 C) 98.1 F (36.7 C)  SpO2: 92% 92%    History of present illness:  77 year old female with past medical history significant for hypertension, hypothyroidism, diabetes, colon cancer status post surgery and chemotherapy presents to the ED complaining of nausea, with one episode of vomiting and chest pain prior to arrival in the ED for 1 day.  Patient reported taking a cough syrup that has hydrocodone, shortly after that developed severe nausea, with one episode of emesis as well as chest pain across her whole chest.  No prior history of CAD.  In the ED, patient was already chest pain-free, EKG showed normal sinus rhythm, troponins negative.  Patient had a heart score of 4 and was admitted for observation and ACS rule out  Today, patient reports feeling better denies any further nausea, no vomiting, no further chest pain, no diaphoresis, no shortness of breath, no dizziness, no fever/chills.  Troponin x3 all negative, EKG with no acute ST changes.  Patient stable to be discharged home with close follow-up with PCP and oncologist  Hospital Course:  Active Problems:   Chest pain  Likely non-cardiac chest pain Currently chest pain-free Troponin x3-, EKG with no acute ST changes, BNP 79 Chest x-ray unremarkable Echo showed EF of 65 to 70%, with grade 1 diastolic dysfunction Patient stable for discharge with follow-up with  PCP  Nausea/vomiting Resolved ??  Sensitivity to narcotics, of note, patient has reaction of nausea and vomiting to codeine noted on allergy list in 2013 Abdominal x-ray unremarkable  Hypertension Continue home meds  Hypothyroidism Continue home meds  DM type II Continue home meds  History of colon cancer Has scheduled appointments with oncologist every 6 months, due 12/2017   Procedures:  None  Consultations:  None  Discharge Exam: BP (!) 167/65 (BP Location: Left Arm)   Pulse 67   Temp 98.1 F (36.7 C)   Resp 18   Ht 5\' 8"  (1.727 m)   Wt 98.6 kg (217 lb 6 oz)   SpO2 92%   BMI 33.05 kg/m   General: NAD Cardiovascular: S1, S2 present Respiratory: CTA B  Discharge Instructions You were cared for by a hospitalist during your hospital stay. If you have any questions about your discharge medications or the care you received while you were in the hospital after you are discharged, you can call the unit and asked to speak with the hospitalist on call if the hospitalist that took care of you is not available. Once you are discharged, your primary care physician will handle any further medical issues. Please note that NO REFILLS for any discharge medications will be authorized once you are discharged, as it is imperative that you return to your primary care physician (or establish a relationship with a primary care physician if you do not have one) for your aftercare needs so that they can reassess your need for medications and monitor your lab  values.  Discharge Instructions    Diet - low sodium heart healthy   Complete by:  As directed    Increase activity slowly   Complete by:  As directed      Allergies as of 11/17/2017      Reactions   Aspirin Other (See Comments)   Runny nose    Codeine Nausea And Vomiting      Medication List    STOP taking these medications   HYDROcodone-homatropine 5-1.5 MG/5ML syrup Commonly known as:  HYCODAN     TAKE these  medications   acetaminophen 325 MG tablet Commonly known as:  TYLENOL Take 325 mg by mouth every 6 (six) hours as needed for mild pain or headache.   amLODipine 5 MG tablet Commonly known as:  NORVASC Take 5 mg by mouth every morning.   atenolol 50 MG tablet Commonly known as:  TENORMIN Take 50 mg by mouth every morning.   glipiZIDE 5 MG 24 hr tablet Commonly known as:  GLUCOTROL XL Take 5 mg by mouth daily with breakfast.   levothyroxine 88 MCG tablet Commonly known as:  SYNTHROID, LEVOTHROID Take 88 mcg by mouth daily.   LORazepam 0.5 MG tablet Commonly known as:  ATIVAN Take 0.5 mg by mouth every 8 (eight) hours as needed. for anxiety   omeprazole 20 MG capsule Commonly known as:  PRILOSEC Take 20 mg by mouth daily.      Allergies  Allergen Reactions  . Aspirin Other (See Comments)    Runny nose   . Codeine Nausea And Vomiting   Follow-up Information    Dione Housekeeper, MD. Schedule an appointment as soon as possible for a visit in 1 week(s).   Specialty:  Family Medicine Contact information: [address] [place] [address] [phone]            The results of significant diagnostics from this hospitalization (including imaging, microbiology, ancillary and laboratory) are listed below for reference.    Significant Diagnostic Studies: Dg Chest 2 View  Result Date: 11/16/2017 CLINICAL DATA:  77 year old female with history of productive cough for the past 2 days. Chest pain today. EXAM: CHEST - 2 VIEW COMPARISON:  No prior chest x-ray.  Chest CT 01/24/2017. FINDINGS: Lung volumes are low. No acute consolidative airspace disease. No pleural effusions. No evidence of pulmonary edema. No suspicious appearing pulmonary nodules or masses. Mild cardiomegaly. Upper mediastinal contours are within normal limits. IMPRESSION: 1. No radiographic evidence of acute pulmonary disease. 2. Mild cardiomegaly. Electronically Signed   By: Vinnie Langton M.D.   On:  11/16/2017 12:06   Dg Abd 2 Views  Result Date: 11/16/2017 CLINICAL DATA:  Nausea, vomiting. EXAM: ABDOMEN - 2 VIEW COMPARISON:  None. FINDINGS: The bowel gas pattern is normal. There is no evidence of free air. Surgical clips are noted in the left upper quadrant. No radio-opaque calculi or other significant radiographic abnormality is seen. IMPRESSION: No evidence of bowel obstruction or ileus. Electronically Signed   By: Marijo Conception, M.D.   On: 11/16/2017 17:05    Microbiology: No results found for this or any previous visit (from the past 240 hour(s)).   Labs: Basic Metabolic Panel: Recent Labs  Lab 11/16/17 1212 11/17/17 0442  NA 140 141  K 4.1 4.0  CL 104 108  CO2 23 24  GLUCOSE 208* 125*  BUN 17 16  CREATININE 0.98 1.04*  CALCIUM 9.2 8.5*   Liver Function Tests: No results for input(s): AST, ALT, ALKPHOS,  BILITOT, PROT, ALBUMIN in the last 168 hours. No results for input(s): LIPASE, AMYLASE in the last 168 hours. No results for input(s): AMMONIA in the last 168 hours. CBC: Recent Labs  Lab 11/16/17 1212  WBC 6.7  HGB 14.0  HCT 42.2  MCV 93.8  PLT 109*   Cardiac Enzymes: Recent Labs  Lab 11/16/17 1658 11/16/17 2258 11/17/17 0442  TROPONINI <0.03 <0.03 <0.03   BNP: BNP (last 3 results) Recent Labs    11/16/17 1658  BNP 79.2    ProBNP (last 3 results) No results for input(s): PROBNP in the last 8760 hours.  CBG: Recent Labs  Lab 11/16/17 1135 11/16/17 1707 11/16/17 2157 11/17/17 0739 11/17/17 1156  GLUCAP 210* 129* 141* 172* 142*       Signed:  Alma Friendly, MD [facility] 11/17/2017, 1:14 PM

## 2017-11-17 NOTE — Progress Notes (Signed)
Patient discharged home. Verbalized understanding of all instructions. Denied any questions or concerns about instructions. Alert and oriented x 4 at time. Transferred to the lobby in the wheelchair.

## 2017-11-26 ENCOUNTER — Telehealth: Payer: Self-pay | Admitting: *Deleted

## 2017-11-26 NOTE — Telephone Encounter (Signed)
Call from pt reporting her CT has not been scheduled. Requested central scheduling contact pt.

## 2017-11-29 ENCOUNTER — Telehealth: Payer: Self-pay | Admitting: Oncology

## 2017-11-29 NOTE — Telephone Encounter (Signed)
Spoke w/ pt to resch appt from 6/13 to 6/17. Pt needed afternoon appt.  Pt did not need new calendar.

## 2017-12-23 ENCOUNTER — Ambulatory Visit (HOSPITAL_COMMUNITY)
Admission: RE | Admit: 2017-12-23 | Discharge: 2017-12-23 | Disposition: A | Discharge status: Home / Self Care | Payer: Medicare Other | Source: Ambulatory Visit | Attending: Oncology | Admitting: Oncology

## 2017-12-23 ENCOUNTER — Encounter (HOSPITAL_COMMUNITY): Payer: Self-pay

## 2017-12-23 DIAGNOSIS — I700 Atherosclerosis of aorta: Secondary | ICD-10-CM | POA: Diagnosis not present

## 2017-12-23 DIAGNOSIS — R161 Splenomegaly, not elsewhere classified: Secondary | ICD-10-CM | POA: Insufficient documentation

## 2017-12-23 DIAGNOSIS — C189 Malignant neoplasm of colon, unspecified: Secondary | ICD-10-CM

## 2017-12-23 DIAGNOSIS — C642 Malignant neoplasm of left kidney, except renal pelvis: Secondary | ICD-10-CM | POA: Insufficient documentation

## 2017-12-23 DIAGNOSIS — I517 Cardiomegaly: Secondary | ICD-10-CM | POA: Insufficient documentation

## 2017-12-23 DIAGNOSIS — R918 Other nonspecific abnormal finding of lung field: Secondary | ICD-10-CM | POA: Insufficient documentation

## 2017-12-23 DIAGNOSIS — N200 Calculus of kidney: Secondary | ICD-10-CM | POA: Insufficient documentation

## 2017-12-23 DIAGNOSIS — K7689 Other specified diseases of liver: Secondary | ICD-10-CM | POA: Insufficient documentation

## 2017-12-23 DIAGNOSIS — K5730 Diverticulosis of large intestine without perforation or abscess without bleeding: Secondary | ICD-10-CM | POA: Insufficient documentation

## 2017-12-23 MED ORDER — IOPAMIDOL (ISOVUE-300) INJECTION 61%
INTRAVENOUS | Status: AC
  Filled 2017-12-23: qty 100

## 2017-12-23 MED ORDER — IOPAMIDOL (ISOVUE-300) INJECTION 61%
100.0000 mL | Freq: Once | INTRAVENOUS | Status: AC | PRN
  Administered 2017-12-23: 100 mL via INTRAVENOUS

## 2017-12-24 ENCOUNTER — Telehealth: Payer: Self-pay | Admitting: Emergency Medicine

## 2017-12-24 NOTE — Telephone Encounter (Addendum)
Pt verbalized understanding  ----- Message from Ladell Pier, MD sent at 12/23/2017  5:30 PM EDT ----- Please call patient, CT shows no evidence of progressive colon cancer or development of liver cancer, the kidney mass is slightly larger, follow-up as scheduled

## 2017-12-26 ENCOUNTER — Ambulatory Visit: Payer: Medicare Other | Admitting: Oncology

## 2017-12-26 ENCOUNTER — Other Ambulatory Visit: Payer: Medicare Other

## 2017-12-30 ENCOUNTER — Inpatient Hospital Stay: Payer: Medicare Other | Attending: Oncology

## 2017-12-30 ENCOUNTER — Inpatient Hospital Stay: Payer: Medicare Other | Admitting: Oncology

## 2017-12-30 ENCOUNTER — Encounter: Payer: Self-pay | Admitting: Oncology

## 2017-12-30 ENCOUNTER — Telehealth: Payer: Self-pay | Admitting: Oncology

## 2017-12-30 VITALS — BP 163/74 | HR 63 | Temp 98.6°F | Resp 18 | Ht 68.0 in | Wt 222.3 lb

## 2017-12-30 DIAGNOSIS — N2889 Other specified disorders of kidney and ureter: Secondary | ICD-10-CM

## 2017-12-30 DIAGNOSIS — C180 Malignant neoplasm of cecum: Secondary | ICD-10-CM | POA: Insufficient documentation

## 2017-12-30 DIAGNOSIS — I10 Essential (primary) hypertension: Secondary | ICD-10-CM | POA: Insufficient documentation

## 2017-12-30 DIAGNOSIS — R600 Localized edema: Secondary | ICD-10-CM | POA: Insufficient documentation

## 2017-12-30 DIAGNOSIS — K7460 Unspecified cirrhosis of liver: Secondary | ICD-10-CM | POA: Diagnosis not present

## 2017-12-30 DIAGNOSIS — E119 Type 2 diabetes mellitus without complications: Secondary | ICD-10-CM | POA: Insufficient documentation

## 2017-12-30 DIAGNOSIS — C189 Malignant neoplasm of colon, unspecified: Secondary | ICD-10-CM

## 2017-12-30 DIAGNOSIS — K7469 Other cirrhosis of liver: Secondary | ICD-10-CM | POA: Diagnosis not present

## 2017-12-30 LAB — CEA (IN HOUSE-CHCC): CEA (CHCC-In House): 5.31 ng/mL — ABNORMAL HIGH (ref 0.00–5.00)

## 2017-12-30 NOTE — Telephone Encounter (Signed)
Appointments scheduled AVS/Calendar printed per 6/17 los °

## 2017-12-30 NOTE — Progress Notes (Signed)
[facility] OFFICE PROGRESS NOTE   Diagnosis: Colon cancer  INTERVAL HISTORY:   Cheryl Jordan returns as scheduled.  Good appetite.  She complains of bilateral lower leg and foot swelling.  This improves during the night.  She had an episode of "gout "in the left great toe last month. She reports undergoing a colonoscopy earlier this year. Objective:  Vital signs in last 24 hours:  Blood pressure (!) 163/74, pulse 63, temperature 98.6 F (37 C), temperature source Oral, resp. rate 18, height 5' 8"  (1.727 m), weight 222 lb 4.8 oz (100.8 kg), SpO2 95 %.    HEENT: Neck without mass Lymphatics: No cervical, supraclavicular, axillary, or inguinal nodes Resp: Lungs clear bilaterally Cardio: Regular rate and rhythm GI: No hepatosplenomegaly, no mass, nontender Vascular: Trace edema at the low leg and ankle bilaterally   Lab Results:  Lab Results  Component Value Date   WBC 6.7 11/16/2017   HGB 14.0 11/16/2017   HCT 42.2 11/16/2017   MCV 93.8 11/16/2017   PLT 109 (L) 11/16/2017   NEUTROABS 2.5 09/26/2017    CMP  Lab Results  Component Value Date   NA 141 11/17/2017   K 4.0 11/17/2017   CL 108 11/17/2017   CO2 24 11/17/2017   GLUCOSE 125 (H) 11/17/2017   BUN 16 11/17/2017   CREATININE 1.04 (H) 11/17/2017   CALCIUM 8.5 (L) 11/17/2017   PROT 6.7 02/07/2017   ALBUMIN 3.5 02/07/2017   AST 42 (H) 02/07/2017   ALT 37 02/07/2017   ALKPHOS 103 02/07/2017   BILITOT 0.74 02/07/2017   GFRNONAA 51 (L) 11/17/2017   GFRAA 59 (L) 11/17/2017    Lab Results  Component Value Date   CEA1 5.14 (H) 09/26/2017     Medications: I have reviewed the patient's current medications.   Assessment/Plan: 1. 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 ? Loss of MLH1 and PMS2 expression ? Microsatellite instability-high ? CT chest 03/03/2015-negative for metastatic disease ? Cycle 1 adjuvant Xeloda 03/18/2015 ? Cycle 2 adjuvant Xeloda 04/09/2015 ? Cycle 3 adjuvant Xeloda 04/30/2015 ? Cycle 4 adjuvant Xeloda 05/21/2015 ? Cycle 5 adjuvant Xeloda 06/11/2015 (dose reduced due to desquamation of feet). ? Cycle 6 adjuvant Xeloda 07/02/2015 ? Cycle 7 adjuvant Xeloda 07/23/2015 ? Cycle 8 adjuvant Xeloda 08/13/2015 ? CTs chest, abdomen, and pelvis 02/06/2016-cirrhosis, heterogenous enhancement of the liver, small hypoenhancing lesions in the liver-? 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 Likely regenerative nodules in the liver;does not appear as HCCor metastases. Left renal mass very slow growing. CT scan recommended in one year) ? Colonoscopy 08/06/2016-multiple 5-15 mm very subtle sessile serrated adenomasfound in the transverse colon and at the anastomosis. Next colonoscopy at a one-year interval. ? CT 12/23/2017- no evidence of recurrent colon cancer, cirrhotic liver with no dominant mass, left renal mass-cystic component mildly larger compared to 2017  2. Anemia, microcytic. Likely iron deficiency from previous GI blood loss. Oral iron initiated 03/17/2015. Improved. 3. 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 She is followed by Dr. Alyson Ingles, urology.Unchanged on CT 02/06/2016, unchanged on MRI 12/06/2016, cystic component slightly larger on CT 12/23/2017 4. Diabetes 5. Hypertension 6. History of mild hand/foot syndrome secondary to Xeloda 7. Enlarging segment 8 liver lesion noted on MRI 12/06/2016-felt to  potentially represent a metastasis or HCC  Ultrasound-guided biopsy of the lesion on 02/06/2017 revealed cirrhosis, no malignancy  CT 12/23/2017- chronic liver nodularity, no dominant mass, changes of  cirrhosis 8. Cirrhosis 9. History of mild thrombocytopenia-likely secondary to cirrhosis   Disposition: Cheryl Jordan remains in clinical remission from colon cancer.  The CEA was mildly elevated in December 2018.  We will follow-up on the CEA from today.  The restaging CT evaluation shows no evidence of recurrent colon cancer.  She will return for an office visit and CEA in 6 months.  She is scheduled to see Dr. Alyson Ingles later this week for follow-up of the left renal mass.   No evidence of hepatocellular carcinoma on the CT 12/23/2017.  Cheryl Jordan will discuss the leg edema with Dr. Edrick Oh.  This may be related to amlodipine therapy.  15 minutes were spent with the patient today.  The majority of the time was used for counseling and coordination of care.  [name], MD  12/30/2017  3:15 PM

## 2017-12-31 LAB — AFP TUMOR MARKER: AFP, Serum, Tumor Marker: 3.2 ng/mL (ref 0.0–8.3)

## 2018-01-02 ENCOUNTER — Telehealth: Payer: Self-pay

## 2018-01-02 NOTE — Telephone Encounter (Addendum)
Pt voiced understanding of message   ----- Message from Ladell Pier, MD sent at 12/30/2017  5:05 PM EDT ----- Please call patient, CEA is mildly elevated and stable, will repeat in 6 months, follow-up as scheduled

## 2018-04-07 ENCOUNTER — Other Ambulatory Visit: Payer: Self-pay | Admitting: Family Medicine

## 2018-04-07 DIAGNOSIS — Z1231 Encounter for screening mammogram for malignant neoplasm of breast: Secondary | ICD-10-CM

## 2018-04-23 DIAGNOSIS — M25569 Pain in unspecified knee: Secondary | ICD-10-CM | POA: Insufficient documentation

## 2018-05-05 ENCOUNTER — Ambulatory Visit
Admission: RE | Admit: 2018-05-05 | Discharge: 2018-05-05 | Disposition: A | Discharge status: Home / Self Care | Payer: Medicare Other | Source: Ambulatory Visit | Attending: Family Medicine | Admitting: Family Medicine

## 2018-05-05 DIAGNOSIS — Z1231 Encounter for screening mammogram for malignant neoplasm of breast: Secondary | ICD-10-CM

## 2018-06-30 ENCOUNTER — Inpatient Hospital Stay: Payer: Medicare Other | Attending: Oncology

## 2018-06-30 ENCOUNTER — Telehealth: Payer: Self-pay

## 2018-06-30 ENCOUNTER — Inpatient Hospital Stay: Payer: Medicare Other | Admitting: Oncology

## 2018-06-30 VITALS — BP 183/71 | HR 58 | Temp 97.7°F | Resp 18 | Wt 219.2 lb

## 2018-06-30 DIAGNOSIS — Z85038 Personal history of other malignant neoplasm of large intestine: Secondary | ICD-10-CM | POA: Insufficient documentation

## 2018-06-30 DIAGNOSIS — D509 Iron deficiency anemia, unspecified: Secondary | ICD-10-CM

## 2018-06-30 DIAGNOSIS — I10 Essential (primary) hypertension: Secondary | ICD-10-CM | POA: Insufficient documentation

## 2018-06-30 DIAGNOSIS — K7469 Other cirrhosis of liver: Secondary | ICD-10-CM | POA: Insufficient documentation

## 2018-06-30 DIAGNOSIS — Z9221 Personal history of antineoplastic chemotherapy: Secondary | ICD-10-CM | POA: Diagnosis not present

## 2018-06-30 DIAGNOSIS — C189 Malignant neoplasm of colon, unspecified: Secondary | ICD-10-CM

## 2018-06-30 DIAGNOSIS — E119 Type 2 diabetes mellitus without complications: Secondary | ICD-10-CM | POA: Insufficient documentation

## 2018-06-30 DIAGNOSIS — N2889 Other specified disorders of kidney and ureter: Secondary | ICD-10-CM | POA: Insufficient documentation

## 2018-06-30 DIAGNOSIS — K7460 Unspecified cirrhosis of liver: Secondary | ICD-10-CM

## 2018-06-30 LAB — CEA (IN HOUSE-CHCC): CEA (CHCC-In House): 4.96 ng/mL (ref 0.00–5.00)

## 2018-06-30 NOTE — Progress Notes (Signed)
  [place] OFFICE PROGRESS NOTE   Diagnosis: Colon cancer  INTERVAL HISTORY:   Cheryl Jordan returns as scheduled.  She feels well.  Good appetite.  No bleeding.  She continues follow-up with urology for evaluation of the renal mass.  Objective:  Vital signs in last 24 hours:  Blood pressure (!) 183/71, pulse (!) 58, temperature 97.7 F (36.5 C), temperature source Oral, resp. rate 18, weight 219 lb 3.2 oz (99.4 kg), SpO2 96 %.    HEENT: Neck without mass Lymphatics: No cervical, supraclavicular, axillary, or inguinal nodes Resp: Lungs with end inspiratory coarse rhonchi at the right greater than left posterior base, no respiratory distress Cardio: Regular rate and rhythm GI: No hepatosplenomegaly, no mass, nontender Vascular: No leg edema  Lab Results:    Lab Results  Component Value Date   CEA1 4.96 06/30/2018     Medications: I have reviewed the patient's current medications.   Assessment/Plan: 1. 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 ? Loss of MLH1 and PMS2 expression ? Microsatellite instability-high ? CT chest 03/03/2015-negative for metastatic disease ? Cycle 1 adjuvant Xeloda 03/18/2015 ? Cycle 2 adjuvant Xeloda 04/09/2015 ? Cycle 3 adjuvant Xeloda 04/30/2015 ? Cycle 4 adjuvant Xeloda 05/21/2015 ? Cycle 5 adjuvant Xeloda 06/11/2015 (dose reduced due to desquamation of feet). ? Cycle 6 adjuvant Xeloda 07/02/2015 ? Cycle 7 adjuvant Xeloda 07/23/2015 ? Cycle 8 adjuvant Xeloda 08/13/2015 ? CTs chest, abdomen, and pelvis 02/06/2016-cirrhosis, heterogenous enhancement of the liver, small hypoenhancing lesions in the liver-? 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 Likely regenerative nodules in the liver;does not appear as HCCor metastases. Left renal mass very slow growing. CT scan recommended in one year) ? Colonoscopy 08/06/2016-multiple 5-15 mm very subtle sessile serrated adenomasfound in the transverse colon and at the anastomosis. Next colonoscopy at a one-year interval. ? CT 12/23/2017- no evidence of recurrent colon cancer, cirrhotic liver with no dominant mass, left renal mass-cystic component mildly larger compared to 2017  2. Anemia, microcytic. Likely iron deficiency from previous GI blood loss. Oral iron initiated 03/17/2015. Improved. 3. 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 She is followed by Dr. Alyson Ingles, urology.Unchanged on CT 02/06/2016, unchanged on MRI 12/06/2016, cystic component slightly larger on CT 12/23/2017 4. Diabetes 5. Hypertension 6. History of mild hand/foot syndrome secondary to Xeloda 7. Enlarging segment 8 liver lesion noted on MRI 12/06/2016-felt to potentially represent a metastasis or HCC  Ultrasound-guided biopsy of the lesion on 02/06/2017 revealed cirrhosis, no malignancy  CT 12/23/2017- chronic liver nodularity, no dominant mass, changes of cirrhosis 8. Cirrhosis 9. History of mild thrombocytopenia-likely secondary to cirrhosis     Disposition: Cheryl Jordan remains in clinical remission from colon cancer.  She will return for an office visit and CEA in 6 months.  15 minutes were spent with the patient today.  The majority of the time was used for counseling and coordination of care.  Cheryl Coder, MD  06/30/2018  12:30 PM

## 2018-06-30 NOTE — Telephone Encounter (Signed)
Printed avs and calender of upcoming appointment. Per 12/16 los 

## 2018-07-01 LAB — AFP TUMOR MARKER: AFP, SERUM, TUMOR MARKER: 3 ng/mL (ref 0.0–8.3)

## 2018-12-29 ENCOUNTER — Inpatient Hospital Stay: Payer: Medicare Other

## 2018-12-29 ENCOUNTER — Inpatient Hospital Stay: Payer: Medicare Other | Attending: Nurse Practitioner | Admitting: Nurse Practitioner

## 2018-12-29 ENCOUNTER — Telehealth: Payer: Self-pay | Admitting: Oncology

## 2018-12-29 ENCOUNTER — Encounter: Payer: Self-pay | Admitting: Nurse Practitioner

## 2018-12-29 ENCOUNTER — Other Ambulatory Visit: Payer: Self-pay

## 2018-12-29 VITALS — BP 165/72 | HR 64 | Temp 97.9°F | Resp 17 | Ht 68.0 in | Wt 220.4 lb

## 2018-12-29 DIAGNOSIS — C189 Malignant neoplasm of colon, unspecified: Secondary | ICD-10-CM

## 2018-12-29 DIAGNOSIS — I10 Essential (primary) hypertension: Secondary | ICD-10-CM | POA: Diagnosis not present

## 2018-12-29 DIAGNOSIS — D509 Iron deficiency anemia, unspecified: Secondary | ICD-10-CM | POA: Insufficient documentation

## 2018-12-29 DIAGNOSIS — E119 Type 2 diabetes mellitus without complications: Secondary | ICD-10-CM

## 2018-12-29 DIAGNOSIS — Z85038 Personal history of other malignant neoplasm of large intestine: Secondary | ICD-10-CM | POA: Insufficient documentation

## 2018-12-29 DIAGNOSIS — D696 Thrombocytopenia, unspecified: Secondary | ICD-10-CM | POA: Diagnosis not present

## 2018-12-29 DIAGNOSIS — Z9221 Personal history of antineoplastic chemotherapy: Secondary | ICD-10-CM | POA: Diagnosis not present

## 2018-12-29 LAB — CEA (IN HOUSE-CHCC): CEA (CHCC-In House): 5.51 ng/mL — ABNORMAL HIGH (ref 0.00–5.00)

## 2018-12-29 NOTE — Telephone Encounter (Signed)
Scheduled appt per los. Gave avs and calendar

## 2018-12-29 NOTE — Progress Notes (Signed)
[facility] OFFICE PROGRESS NOTE   Diagnosis: Colon cancer  INTERVAL HISTORY:   [name] returns as scheduled.  Bowels moving regularly.  No bleeding with bowel movements.  She denies abdominal pain.  She has a good appetite.  No nausea or vomiting.  She has occasional dyspnea on exertion.  No fever or cough.  Objective:  Vital signs in last 24 hours:  Blood pressure (!) 165/72, pulse 64, temperature 97.9 F (36.6 C), temperature source Oral, resp. rate 17, height 5' 8"  (1.727 m), weight 220 lb 6.4 oz (100 kg), SpO2 96 %.    HEENT: Neck without mass. Lymphatics: No palpable cervical, supraclavicular, axillary or inguinal lymph nodes. GI: Abdomen soft and nontender.  No hepatomegaly.  No mass. Vascular: Trace edema at the lower legs bilaterally.  Lab Results:  Lab Results  Component Value Date   WBC 6.7 11/16/2017   HGB 14.0 11/16/2017   HCT 42.2 11/16/2017   MCV 93.8 11/16/2017   PLT 109 (L) 11/16/2017   NEUTROABS 2.5 09/26/2017    Imaging:  No results found.  Medications: I have reviewed the patient's current medications.  Assessment/Plan: 1. 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 ? Loss of MLH1 and PMS2 expression ? Microsatellite instability-high ? CT chest 03/03/2015-negative for metastatic disease ? Cycle 1 adjuvant Xeloda 03/18/2015 ? Cycle 2 adjuvant Xeloda 04/09/2015 ? Cycle 3 adjuvant Xeloda 04/30/2015 ? Cycle 4 adjuvant Xeloda 05/21/2015 ? Cycle 5 adjuvant Xeloda 06/11/2015 (dose reduced due to desquamation of feet). ? Cycle 6 adjuvant Xeloda 07/02/2015 ? Cycle 7 adjuvant Xeloda 07/23/2015 ? Cycle 8 adjuvant Xeloda 08/13/2015 ? CTs chest, abdomen, and pelvis  02/06/2016-cirrhosis, heterogenous enhancement of the liver, small hypoenhancing lesions in the liver-? 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 Likely regenerative nodules in the liver;does not appear as HCCor metastases. Left renal mass very slow growing. CT scan recommended in one year) ? Colonoscopy 08/06/2016-multiple 5-15 mm very subtle sessile serrated adenomasfound in the transverse colon and at the anastomosis. Next colonoscopy at a one-year interval. ? CT 12/23/2017- no evidence of recurrent colon cancer, cirrhotic liver with no dominant mass, left renal mass-cystic component mildly larger compared to 2017  2. Anemia, microcytic. Likely iron deficiency from previous GI blood loss. Oral iron initiated 03/17/2015. Improved. 3. 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 She is followed by Dr. Alyson Ingles, urology.Unchanged on CT 02/06/2016, unchanged on MRI 12/06/2016, cystic component slightly larger on CT 12/23/2017 4. Diabetes 5. Hypertension 6. History of mild hand/foot syndrome secondary to Xeloda 7. Enlarging segment 8 liver lesion noted on MRI 12/06/2016-felt to potentially represent a metastasis or HCC  Ultrasound-guided biopsy of the lesion on 02/06/2017 revealed cirrhosis, no malignancy  CT 12/23/2017- chronic liver nodularity, no dominant mass, changes of cirrhosis 8. Cirrhosis 9. History of mild thrombocytopenia-likely secondary to cirrhosis   Disposition: Cheryl Jordan remains in clinical remission from colon cancer.  We will follow-up on the CEA from today.  She continues follow-up of the left renal mass with Dr. Alyson Ingles.  She will return for a CEA and follow-up visit in 6 months.  She will contact the office in the interim with any problems.    Ned Card ANP/GNP-BC   12/29/2018  2:11 PM

## 2019-01-29 ENCOUNTER — Ambulatory Visit (INDEPENDENT_AMBULATORY_CARE_PROVIDER_SITE_OTHER): Payer: Medicare Other | Admitting: Physician Assistant

## 2019-01-29 ENCOUNTER — Encounter: Payer: Self-pay | Admitting: Physician Assistant

## 2019-01-29 ENCOUNTER — Other Ambulatory Visit: Payer: Self-pay

## 2019-01-29 VITALS — BP 140/86 | HR 55 | Temp 97.9°F | Resp 16 | Ht 68.0 in | Wt 217.0 lb

## 2019-01-29 DIAGNOSIS — E1169 Type 2 diabetes mellitus with other specified complication: Secondary | ICD-10-CM | POA: Diagnosis not present

## 2019-01-29 DIAGNOSIS — C189 Malignant neoplasm of colon, unspecified: Secondary | ICD-10-CM | POA: Diagnosis not present

## 2019-01-29 DIAGNOSIS — E1159 Type 2 diabetes mellitus with other circulatory complications: Secondary | ICD-10-CM | POA: Diagnosis not present

## 2019-01-29 DIAGNOSIS — E039 Hypothyroidism, unspecified: Secondary | ICD-10-CM | POA: Diagnosis not present

## 2019-01-29 DIAGNOSIS — I10 Essential (primary) hypertension: Secondary | ICD-10-CM | POA: Diagnosis not present

## 2019-01-29 DIAGNOSIS — E785 Hyperlipidemia, unspecified: Secondary | ICD-10-CM

## 2019-01-29 DIAGNOSIS — R932 Abnormal findings on diagnostic imaging of liver and biliary tract: Secondary | ICD-10-CM

## 2019-01-29 DIAGNOSIS — Z780 Asymptomatic menopausal state: Secondary | ICD-10-CM

## 2019-01-29 LAB — CBC WITH DIFFERENTIAL/PLATELET
Basophils Absolute: 0 10*3/uL (ref 0.0–0.1)
Basophils Relative: 0.5 % (ref 0.0–3.0)
Eosinophils Absolute: 0.1 10*3/uL (ref 0.0–0.7)
Eosinophils Relative: 1.3 % (ref 0.0–5.0)
HCT: 39.2 % (ref 36.0–46.0)
Hemoglobin: 12.9 g/dL (ref 12.0–15.0)
Lymphocytes Relative: 36.1 % (ref 12.0–46.0)
Lymphs Abs: 1.7 10*3/uL (ref 0.7–4.0)
MCHC: 32.8 g/dL (ref 30.0–36.0)
MCV: 91.9 fl (ref 78.0–100.0)
Monocytes Absolute: 0.2 10*3/uL (ref 0.1–1.0)
Monocytes Relative: 4.1 % (ref 3.0–12.0)
Neutro Abs: 2.7 10*3/uL (ref 1.4–7.7)
Neutrophils Relative %: 58 % (ref 43.0–77.0)
Platelets: 98 10*3/uL — ABNORMAL LOW (ref 150.0–400.0)
RBC: 4.27 Mil/uL (ref 3.87–5.11)
RDW: 15.3 % (ref 11.5–15.5)
WBC: 4.7 10*3/uL (ref 4.0–10.5)

## 2019-01-29 LAB — COMPREHENSIVE METABOLIC PANEL
ALT: 28 U/L (ref 0–35)
AST: 48 U/L — ABNORMAL HIGH (ref 0–37)
Albumin: 4 g/dL (ref 3.5–5.2)
Alkaline Phosphatase: 118 U/L — ABNORMAL HIGH (ref 39–117)
BUN: 15 mg/dL (ref 6–23)
CO2: 26 mEq/L (ref 19–32)
Calcium: 9.1 mg/dL (ref 8.4–10.5)
Chloride: 106 mEq/L (ref 96–112)
Creatinine, Ser: 0.97 mg/dL (ref 0.40–1.20)
GFR: 55.68 mL/min — ABNORMAL LOW (ref >60.00)
Glucose, Bld: 187 mg/dL — ABNORMAL HIGH (ref 70–99)
Potassium: 4.6 mEq/L (ref 3.5–5.1)
Sodium: 141 mEq/L (ref 135–145)
Total Bilirubin: 1.1 mg/dL (ref 0.2–1.2)
Total Protein: 6.5 g/dL (ref 6.0–8.3)

## 2019-01-29 LAB — TSH: TSH: 1.28 u[IU]/mL (ref 0.35–4.50)

## 2019-01-29 LAB — HEMOGLOBIN A1C: Hgb A1c MFr Bld: 7.3 % — ABNORMAL HIGH (ref 4.6–6.5)

## 2019-01-29 LAB — LIPID PANEL
Cholesterol: 142 mg/dL (ref 0–200)
HDL: 41.9 mg/dL (ref >39.00)
LDL Cholesterol: 71 mg/dL (ref 0–99)
NonHDL: 99.81
Total CHOL/HDL Ratio: 3
Triglycerides: 143 mg/dL (ref 0.0–149.0)
VLDL: 28.6 mg/dL (ref 0.0–40.0)

## 2019-01-29 NOTE — Patient Instructions (Signed)
Please go to the lab today for blood work.  I will call you with your results. We will alter treatment regimen(s) if indicated by your results.   Let us know when you are due for refills.  Continue current medication regimen for now. We will get you set up for a bone density test once things calm down more with COVID.  It was very nice meeting you today. Welcome to [facility]!

## 2019-01-29 NOTE — Progress Notes (Signed)
Patient presents to clinic today to establish care.  Acute Concerns: Denies acute concerns at today's visit. Reviewing HM parameters, patient is due for a repeat Bone Density assessment.   Chronic Issues: Hypertension -- Patient is currently on a regimen of Atenolol 50 mg QD, Amlodipine 2.5 mg QD. Endorses taking medications as directed. Is in need of refills. Patient denies chest pain, palpitations, lightheadedness, dizziness, vision changes or frequent headaches.  BP Readings from Last 3 Encounters:  01/29/19 140/86  12/29/18 (!) 165/72  06/30/18 (!) 183/71   Diabetes Mellitus II -- Patient is currently on regimen of Glipizide 5 mg QD. Endorses taking as directed. Notes she is due for labs and foot examination. Note eye exam is up-to-date. Denies known history of retinopathy. Has history of peripheral neuropathy of some toes bilaterally but notes stable for years without deterioration. 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  Not currently on ACEI or ARB but denies allergy. Notes glucose levels are averaging around 140 currently. Tries to keep a low carb diet but notes diet has not been the best since COVID. Denies regular exercise due to some arthritis for which she is followed by Orthopedics.  Hypothyroidism -- Currently taking levothyroxine 88 mcg daily. Endorses taking daily as directed. Unsure of last TSH check.   Patient also notes a history of abnormal liver lab and imaging. Underwent liver biopsy 2 years ago due to questionable cirrhotic changes of liver. Biopsy negative for malignancy. Is being followed by Oncology as she has history of adenocarcinoma of colon. IS getting routine liver testing and CEA levels.   Health Maintenance: Immunizations -- Unsure. Will need to obtain records from previous PCP. Colonoscopy -- Last year. Stable per patient. Recall in 3 years at this point.  Mammogram -- yeearly. Denies history of abnormal mammogram.  Bone Density -- Has been over 5  years. Agrees to get as things with COVID calm down.   Past Medical History:  Diagnosis Date   Allergy    Anemia    hx of   Arthritis    Colon cancer ([place]) dx'd 11/2014   Diabetes mellitus without complication (HCC)    TYPE II   GERD (gastroesophageal reflux disease)    History of gout    Hypertension    Hypothyroidism    PONV (postoperative nausea and vomiting)     Past Surgical History:  Procedure Laterality Date   CHOLECYSTECTOMY     IR RADIOLOGIST EVAL & MGMT  01/24/2017   KNEE SURGERY     NEPHRECTOMY Left 02/10/2015   Procedure: OPEN RETROPERINTONEAL EXPLORATION LEFT RENAL  CYST DECORTICATION X 5;  Surgeon: Cleon Gustin, MD;  Location: [facility];  Service: Urology;  Laterality: Left;   PARTIAL COLECTOMY N/A 02/10/2015   Procedure: OPEN RIGHT  COLECTOMY ;  Surgeon: [name], MD;  Location: [facility];  Service: General;  Laterality: N/A;   TUBAL LIGATION      Current Outpatient Medications on File Prior to Visit  Medication Sig Dispense Refill   ACCU-CHEK AVIVA PLUS test strip      amLODipine (NORVASC) 2.5 MG tablet Take 1 tablet by mouth daily.     atenolol (TENORMIN) 50 MG tablet Take 50 mg by mouth every morning.      glipiZIDE (GLUCOTROL XL) 5 MG 24 hr tablet Take 5 mg by mouth daily with breakfast.     levothyroxine (SYNTHROID, LEVOTHROID) 88 MCG tablet Take 88 mcg by mouth daily.     omeprazole (PRILOSEC)  20 MG capsule Take 20 mg by mouth daily.  3   LORazepam (ATIVAN) 0.5 MG tablet Take 0.5 mg by mouth every 8 (eight) hours as needed. for anxiety  0   No current facility-administered medications on file prior to visit.     Allergies  Allergen Reactions   Aspirin Other (See Comments)    Runny nose    Codeine Nausea And Vomiting    Family History  Problem Relation Age of Onset   Diabetes Mother    Hypertension Other    Heart disease Father    Heart attack Brother    COPD Brother     Social History   Socioeconomic  History   Marital status: Married    Spouse name: Not on file   Number of children: Not on file   Years of education: Not on file   Highest education level: Not on file  Occupational History   Occupation: Retired  Scientist, product/process development strain: Not on file   Food insecurity    Worry: Not on file    Inability: Not on Lexicographer needs    Medical: Not on file    Non-medical: Not on file  Tobacco Use   Smoking status: Never Smoker   Smokeless tobacco: Never Used  Substance and Sexual Activity   Alcohol use: No   Drug use: No   Sexual activity: Never  Lifestyle   Physical activity    Days per week: Not on file    Minutes per session: Not on file   Stress: Not on file  Relationships   Social connections    Talks on phone: Not on file    Gets together: Not on file    Attends religious service: Not on file    Active member of club or organization: Not on file    Attends meetings of clubs or organizations: Not on file    Relationship status: Not on file   Intimate partner violence    Fear of current or ex partner: Not on file    Emotionally abused: Not on file    Physically abused: Not on file    Forced sexual activity: Not on file  Other Topics Concern   Not on file  Social History Narrative   03/03/15-Married, husband Stefanie Libel 15 years   #2 grown sons and #2 grand daughters   Has outdoor cat   Retired from [employer]   Used to garden and can food when well   Review of Systems  Constitutional: Negative for fever, malaise/fatigue and weight loss.  HENT: Negative for hearing loss.   Eyes: Negative for blurred vision and double vision.  Respiratory: Negative for cough and shortness of breath.   Cardiovascular: Negative for chest pain and palpitations.  Neurological: Negative for dizziness and loss of consciousness.  Psychiatric/Behavioral: Negative for depression and suicidal ideas. The patient does not have insomnia.    BP  140/86    Pulse (!) 55    Temp 97.9 F (36.6 C) (Skin)    Resp 16    Ht 5\' 8"  (1.727 m)    Wt 217 lb (98.4 kg)    SpO2 98%    BMI 32.99 kg/m   Physical Exam Vitals signs reviewed.  Constitutional:      Appearance: Normal appearance.  HENT:     Head: Normocephalic and atraumatic.     Right Ear: Tympanic membrane normal.     Left Ear: Tympanic membrane normal.  Mouth/Throat:     Mouth: Mucous membranes are moist.  Eyes:     Conjunctiva/sclera: Conjunctivae normal.     Pupils: Pupils are equal, round, and reactive to light.  Neck:     Musculoskeletal: Neck supple.  Cardiovascular:     Rate and Rhythm: Normal rate and regular rhythm.     Pulses: Normal pulses.     Heart sounds: Normal heart sounds.  Pulmonary:     Effort: Pulmonary effort is normal.     Breath sounds: Normal breath sounds.  Neurological:     General: No focal deficit present.     Mental Status: She is alert and oriented to person, place, and time.  Psychiatric:        Mood and Affect: Mood normal.    Diabetic Foot Exam - Simple   Simple Foot Form Diabetic Foot exam was performed with the following findings: Yes 01/29/2019  1:03 PM  Visual Inspection No deformities, no ulcerations, no other skin breakdown bilaterally: Yes Sensation Testing See comments: Yes Pulse Check Posterior Tibialis and Dorsalis pulse intact bilaterally: Yes Comments Diminished sensation of distal third toes bilaterally. Otherwise sensation intact.       Recent Results (from the past 2160 hour(s))  CEA (IN HOUSE-[facility])     Status: Abnormal   Collection Time: 12/29/18  1:36 PM  Result Value Ref Range   CEA ([facility]-In House) 5.51 (H) 0.00 - 5.00 ng/mL    Comment: (NOTE) This test was performed using [employer]'s Chemiluminescent Microparticle Immunoassay. Values obtained from different assay methods cannot be used interchangeably. Please note that 5-10% of patients who smoke may see CEA levels up to 6.9 ng/mL. Performed at [facility], [place] [address], [place], [place] [address]   CBC with Differential/Platelet     Status: Abnormal   Collection Time: 01/29/19  8:52 AM  Result Value Ref Range   WBC 4.7 4.0 - 10.5 K/uL   RBC 4.27 3.87 - 5.11 Mil/uL   Hemoglobin 12.9 12.0 - 15.0 g/dL   HCT 39.2 36.0 - 46.0 %   MCV 91.9 78.0 - 100.0 fl   MCHC 32.8 30.0 - 36.0 g/dL   RDW 15.3 11.5 - 15.5 %   Platelets 98.0 (L) 150.0 - 400.0 K/uL   Neutrophils Relative % 58.0 43.0 - 77.0 %   Lymphocytes Relative 36.1 12.0 - 46.0 %   Monocytes Relative 4.1 3.0 - 12.0 %   Eosinophils Relative 1.3 0.0 - 5.0 %   Basophils Relative 0.5 0.0 - 3.0 %   Neutro Abs 2.7 1.4 - 7.7 K/uL   Lymphs Abs 1.7 0.7 - 4.0 K/uL   Monocytes Absolute 0.2 0.1 - 1.0 K/uL   Eosinophils Absolute 0.1 0.0 - 0.7 K/uL   Basophils Absolute 0.0 0.0 - 0.1 K/uL  Comprehensive metabolic panel     Status: Abnormal   Collection Time: 01/29/19  8:52 AM  Result Value Ref Range   Sodium 141 135 - 145 mEq/L   Potassium 4.6 3.5 - 5.1 mEq/L   Chloride 106 96 - 112 mEq/L   CO2 26 19 - 32 mEq/L   Glucose, Bld 187 (H) 70 - 99 mg/dL   BUN 15 6 - 23 mg/dL   Creatinine, Ser 0.97 0.40 - 1.20 mg/dL   Total Bilirubin 1.1 0.2 - 1.2 mg/dL   Alkaline Phosphatase 118 (H) 39 - 117 U/L   AST 48 (H) 0 - 37 U/L   ALT 28 0 - 35 U/L  Total Protein 6.5 6.0 - 8.3 g/dL   Albumin 4.0 3.5 - 5.2 g/dL   Calcium 9.1 8.4 - 10.5 mg/dL   GFR 55.68 (L) >60.00 mL/min  Hemoglobin A1c     Status: Abnormal   Collection Time: 01/29/19  8:52 AM  Result Value Ref Range   Hgb A1c MFr Bld 7.3 (H) 4.6 - 6.5 %    Comment: Glycemic Control Guidelines for People with Diabetes:Non Diabetic:  <6%Goal of Therapy: <7%Additional Action Suggested:  >8%   Lipid panel     Status: None   Collection Time: 01/29/19  8:52 AM  Result Value Ref Range   Cholesterol 142 0 - 200 mg/dL    Comment: ATP III Classification       Desirable:  < 200 mg/dL               Borderline High:  200 - 239  mg/dL          High:  > = 240 mg/dL   Triglycerides 143.0 0.0 - 149.0 mg/dL    Comment: Normal:  <150 mg/dLBorderline High:  150 - 199 mg/dL   HDL 41.90 >39.00 mg/dL   VLDL 28.6 0.0 - 40.0 mg/dL   LDL Cholesterol 71 0 - 99 mg/dL   Total CHOL/HDL Ratio 3     Comment:                Men          Women1/2 Average Risk     3.4          3.3Average Risk          5.0          4.42X Average Risk          9.6          7.13X Average Risk          15.0          11.0                       NonHDL 99.81     Comment: NOTE:  Non-HDL goal should be 30 mg/dL higher than patient's LDL goal (i.e. LDL goal of < 70 mg/dL, would have non-HDL goal of < 100 mg/dL)  TSH     Status: None   Collection Time: 01/29/19  8:52 AM  Result Value Ref Range   TSH 1.28 0.35 - 4.50 uIU/mL   Assessment/Plan: 1. Hypertension associated with diabetes ([place]) Much improved from prior checks. Asymptomatic. Continue current regimen. Repeat labs today.  - CBC with Differential/Platelet - Comprehensive metabolic panel - Hemoglobin A1c - Lipid panel - TSH  3. 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 Repeat labs today. Will alter regimen accordingly. Dietary recommendations reviewed. Will get records to assess immunizations and why she is not on ACEI/ARB.  - CBC with Differential/Platelet - Comprehensive metabolic panel - Hemoglobin A1c - Lipid panel - TSH  4. Hypothyroidism, unspecified type Taking levothyroxine as directed. Will repeat TSH today to ensure euthyroid state. Will alter dose if indicated by results.   5. Malignant neoplasm of colon, unspecified part of colon ([place]) Remission. Managed by Oncology.   6. Abnormal finding on imaging of liver Being monitored by Oncology. Last liver biopsy unremarkable. Last follow-up in December. Will keep check on liver enzymes. Repeat labs today.   Leeanne Rio, PA-C

## 2019-02-01 DIAGNOSIS — R932 Abnormal findings on diagnostic imaging of liver and biliary tract: Secondary | ICD-10-CM | POA: Insufficient documentation

## 2019-04-13 ENCOUNTER — Encounter: Payer: Self-pay | Admitting: Emergency Medicine

## 2019-04-13 LAB — HM DIABETES EYE EXAM

## 2019-04-16 ENCOUNTER — Other Ambulatory Visit: Payer: Self-pay

## 2019-04-16 DIAGNOSIS — Z20822 Contact with and (suspected) exposure to covid-19: Secondary | ICD-10-CM

## 2019-04-17 LAB — NOVEL CORONAVIRUS, NAA: SARS-CoV-2, NAA: NOT DETECTED

## 2019-04-23 ENCOUNTER — Ambulatory Visit
Admission: RE | Admit: 2019-04-23 | Discharge: 2019-04-23 | Disposition: A | Discharge status: Home / Self Care | Payer: Medicare Other | Source: Ambulatory Visit | Attending: Physician Assistant | Admitting: Physician Assistant

## 2019-04-23 ENCOUNTER — Other Ambulatory Visit: Payer: Self-pay

## 2019-04-23 DIAGNOSIS — Z780 Asymptomatic menopausal state: Secondary | ICD-10-CM

## 2019-05-06 ENCOUNTER — Ambulatory Visit (INDEPENDENT_AMBULATORY_CARE_PROVIDER_SITE_OTHER): Payer: Medicare Other | Admitting: Physician Assistant

## 2019-05-06 ENCOUNTER — Other Ambulatory Visit: Payer: Self-pay

## 2019-05-06 ENCOUNTER — Encounter: Payer: Self-pay | Admitting: Physician Assistant

## 2019-05-06 VITALS — BP 130/70 | HR 68 | Temp 98.2°F | Resp 16 | Ht 68.0 in | Wt 219.0 lb

## 2019-05-06 DIAGNOSIS — M810 Age-related osteoporosis without current pathological fracture: Secondary | ICD-10-CM | POA: Diagnosis not present

## 2019-05-06 DIAGNOSIS — K219 Gastro-esophageal reflux disease without esophagitis: Secondary | ICD-10-CM

## 2019-05-06 MED ORDER — ALENDRONATE SODIUM 70 MG PO TABS
70.0000 mg | ORAL_TABLET | ORAL | 3 refills | Status: DC
Start: 2019-05-06 — End: 2019-06-09

## 2019-05-06 MED ORDER — SACCHAROMYCES BOULARDII 250 MG PO CAPS: 250.0000 mg | ORAL_CAPSULE | Freq: Two times a day (BID) | ORAL | 1 refills | Status: DC

## 2019-05-06 MED ORDER — PANTOPRAZOLE SODIUM 40 MG PO TBEC: 40.0000 mg | DELAYED_RELEASE_TABLET | Freq: Every day | ORAL | 3 refills | Status: DC

## 2019-05-06 NOTE — Progress Notes (Signed)
Patient presents to clinic today to discuss bone density results and treatment options. Patient had DEXA scan on 04/23/2019 revealing osteoporosis with t-score of -2.8. Was recommended to start a Citracal-D supplement until follow-up. Has started and taking as directed. Tolerating well. In terms of exercise, none currently. Does try to keep active with house work.   Patient also noting breakthrough GERD symptoms despite daily Prilosec. Also having belching and burping. BM normal per patient, without melena, hematochezia or tenesmus. Denies abdominal pain.   Past Medical History:  Diagnosis Date  . Allergy   . Anemia    hx of  . Arthritis   . Colon cancer ([place]) dx'd 11/2014  . Diabetes mellitus without complication ([place])    TYPE II  . GERD (gastroesophageal reflux disease)   . History of gout   . Hypertension   . Hypothyroidism   . PONV (postoperative nausea and vomiting)     Current Outpatient Medications on File Prior to Visit  Medication Sig Dispense Refill  . ACCU-CHEK AVIVA PLUS test strip     . amLODipine (NORVASC) 2.5 MG tablet Take 1 tablet by mouth daily.    Marland Kitchen atenolol (TENORMIN) 50 MG tablet Take 50 mg by mouth every morning.     . calcium citrate-vitamin D (CITRACAL+D) 315-200 MG-UNIT tablet Take 1 tablet by mouth 2 (two) times daily.    Marland Kitchen glipiZIDE (GLUCOTROL XL) 5 MG 24 hr tablet Take 5 mg by mouth daily with breakfast.    . levothyroxine (SYNTHROID, LEVOTHROID) 88 MCG tablet Take 88 mcg by mouth daily.    Marland Kitchen LORazepam (ATIVAN) 0.5 MG tablet Take 0.5 mg by mouth every 8 (eight) hours as needed. for anxiety  0  . omeprazole (PRILOSEC) 20 MG capsule Take 20 mg by mouth daily.  3   No current facility-administered medications on file prior to visit.     Allergies  Allergen Reactions  . Aspirin Other (See Comments)    Runny nose   . Codeine Nausea And Vomiting    Family History  Problem Relation Age of Onset  . Diabetes Mother   . Hypertension Other   . Heart  disease Father   . Heart attack Brother   . COPD Brother     Social History   Socioeconomic History  . Marital status: Married    Spouse name: Not on file  . Number of children: Not on file  . Years of education: Not on file  . Highest education level: Not on file  Occupational History  . Occupation: Retired  Scientific laboratory technician  . Financial resource strain: Not on file  . Food insecurity    Worry: Not on file    Inability: Not on file  . Transportation needs    Medical: Not on file    Non-medical: Not on file  Tobacco Use  . Smoking status: Never Smoker  . Smokeless tobacco: Never Used  Substance and Sexual Activity  . Alcohol use: No  . Drug use: No  . Sexual activity: Never  Lifestyle  . Physical activity    Days per week: Not on file    Minutes per session: Not on file  . Stress: Not on file  Relationships  . Social Herbalist on phone: Not on file    Gets together: Not on file    Attends religious service: Not on file    Active member of club or organization: Not on file    Attends meetings of clubs  or organizations: Not on file    Relationship status: Not on file  Other Topics Concern  . Not on file  Social History Narrative   03/03/15-Married, husband Stefanie Libel 24 years   #2 grown sons and #2 grand daughters   Has outdoor cat   Retired from [employer] to garden and can food when well   Review of Systems - See HPI.  All other ROS are negative.  BP 130/70   Pulse 68   Temp 98.2 F (36.8 C) (Temporal)   Resp 16   Ht 5\' 8"  (1.727 m)   Wt 219 lb (99.3 kg)   SpO2 98%   BMI 33.30 kg/m   Physical Exam Vitals signs reviewed.  Constitutional:      Appearance: Normal appearance.  HENT:     Head: Normocephalic and atraumatic.  Neck:     Musculoskeletal: Neck supple.  Cardiovascular:     Rate and Rhythm: Normal rate and regular rhythm.     Heart sounds: Normal heart sounds.  Abdominal:     General: Bowel sounds are normal.      Palpations: Abdomen is soft.     Tenderness: There is no abdominal tenderness.  Neurological:     Mental Status: She is alert.    Recent Results (from the past 2160 hour(s))  HM DIABETES EYE EXAM     Status: None   Collection Time: 04/13/19 12:00 AM  Result Value Ref Range   HM Diabetic Eye Exam No Retinopathy No Retinopathy  Novel Coronavirus, NAA ([facility])     Status: None   Collection Time: 04/16/19 12:00 AM   Specimen: Oropharyngeal(OP) collection in vial transport medium   OROPHARYNGEA  TESTING  Result Value Ref Range   SARS-CoV-2, NAA Not Detected Not Detected    Comment: Testing was performed using the cobas(R) SARS-CoV-2 test. This nucleic acid amplification test was developed and its performance characteristics determined by [employer], Dickinson and Company. Nucleic acid amplification tests include PCR and TMA. This test has not been FDA cleared or approved. This test has been authorized by FDA under an Emergency Use Authorization (EUA). This test is only authorized for the duration of time the declaration that circumstances exist justifying the authorization of the emergency use of in vitro diagnostic tests for detection of SARS-CoV-2 virus and/or diagnosis of COVID-19 infection under section 564(b)(1) of the Act, 21 U.S.C. 336PQA-4(S) (1), unless the authorization is terminated or revoked sooner. When diagnostic testing is negative, the possibility of a false negative result should be considered in the context of a patient's recent exposures and the presence of clinical signs and symptoms consistent with COVID-19. An individual without symptoms  of COVID-19 and who is not shedding SARS-CoV-2 virus would expect to have a negative (not detected) result in this assay.    Assessment/Plan: 1. Age-related osteoporosis without current pathological fracture Discussed options for treatment. Elected to start once weekly Fosamax. Rx sent. Continue Calcium-D supplementation. Discussed mild  resistance training with upper body using 2-5 lb weights. She will start this. Follow-up 1 month to assess how she is doing. Will check Vitamin D levels at that time.   2. Gastroesophageal reflux disease without esophagitis Will stop Omeprazole and start trial of Protonix x 2 weeks. Start probiotic -- Rx Florastor. GERD diet reviewed. Will follow.   Leeanne Rio, PA-C

## 2019-05-06 NOTE — Patient Instructions (Addendum)
Please stop the Omeprazole. Take the Protonix as directed. Try to start the daily probiotic (Florastor). I have sent to pharmacy.  We will start the Fosamax once weekly as it is showing me that is what is covered by insurance (cheapest). If you issue with cost, tell pharmacist "No Thank You" and let me know.   We will follow-up via phone in 1 month to see how you are tolerating the once weekly medication

## 2019-06-08 ENCOUNTER — Other Ambulatory Visit: Payer: Self-pay | Admitting: Emergency Medicine

## 2019-06-08 DIAGNOSIS — E1169 Type 2 diabetes mellitus with other specified complication: Secondary | ICD-10-CM

## 2019-06-08 MED ORDER — GLIPIZIDE ER 5 MG PO TB24: 5.0000 mg | ORAL_TABLET | Freq: Every day | ORAL | 1 refills | Status: DC

## 2019-06-09 ENCOUNTER — Ambulatory Visit (INDEPENDENT_AMBULATORY_CARE_PROVIDER_SITE_OTHER): Payer: Medicare Other | Admitting: Physician Assistant

## 2019-06-09 ENCOUNTER — Other Ambulatory Visit: Payer: Self-pay

## 2019-06-09 ENCOUNTER — Encounter: Payer: Self-pay | Admitting: Physician Assistant

## 2019-06-09 DIAGNOSIS — K219 Gastro-esophageal reflux disease without esophagitis: Secondary | ICD-10-CM

## 2019-06-09 DIAGNOSIS — M810 Age-related osteoporosis without current pathological fracture: Secondary | ICD-10-CM | POA: Diagnosis not present

## 2019-06-09 MED ORDER — LEVOTHYROXINE SODIUM 88 MCG PO TABS: 88.0000 ug | ORAL_TABLET | Freq: Every day | ORAL | 1 refills | Status: DC

## 2019-06-09 MED ORDER — OMEPRAZOLE 20 MG PO CPDR: 20.0000 mg | DELAYED_RELEASE_CAPSULE | Freq: Every day | ORAL | 1 refills | Status: DC

## 2019-06-09 MED ORDER — ATENOLOL 50 MG PO TABS: 50.0000 mg | ORAL_TABLET | Freq: Every morning | ORAL | 1 refills | Status: DC

## 2019-06-09 MED ORDER — ALENDRONATE SODIUM 70 MG PO TABS: 70.0000 mg | ORAL_TABLET | ORAL | 1 refills | Status: DC

## 2019-06-09 MED ORDER — AMLODIPINE BESYLATE 2.5 MG PO TABS: 2.5000 mg | ORAL_TABLET | Freq: Every day | ORAL | 1 refills | Status: DC

## 2019-06-09 NOTE — Progress Notes (Signed)
Virtual Visit via Telephone Note I connected with Cheryl Jordan on 06/09/19 at  9:00 AM EST by telephone and verified that I am speaking with the correct person using two identifiers.  Location: Patient: Home  Provider: [facility]    I discussed the limitations, risks, security and privacy concerns of performing an evaluation and management service by telephone and the availability of in person appointments. I also discussed with the patient that there may be a patient responsible charge related to this service. The patient expressed understanding and agreed to proceed.  History of Present Illness: Patient presents today to follow-up for osteoporosis. At last visit was started on Fosamax. Patient states she went to pick up medication but told would be 45.00 month from local pharmacy. As such she did not pick up. She has been taking her Citracal-D supplement as directed.   Also in regards to patient's GERD, she did start Protonix daily. Notes a swimmy-headed sensation with medication. This happened for 5 days and so she stopped. Restarted her Omeprazole along with probiotic and notes good control of symptoms. Would like new Rx for Omeprazole sent to mail-order pharmacy today.   Observations/Objective: No labored breathing.  Speech is clear and coherent with logical content.  Patient is alert and oriented at baseline.   Assessment and Plan: 1. Age-related osteoporosis without current pathological fracture Will attempt to send Fosamax Rx to her mail order pharmacy to see if this offers lower co-pay. If still an issue she is to let us know so we can make further adjustments.  2. Gastroesophageal reflux disease without esophagitis Rx Omeprazole sent to mail order. Continue with probiotic. GERD regimen reviewed.    Follow Up Instructions: I discussed the assessment and treatment plan with the patient. The patient was provided an opportunity to ask questions and all were answered. The  patient agreed with the plan and demonstrated an understanding of the instructions.   The patient was advised to call back or seek an in-person evaluation if the symptoms worsen or if the condition fails to improve as anticipated.  I provided 10 minutes of non-face-to-face time during this encounter.   Leeanne Rio, PA-C

## 2019-06-09 NOTE — Progress Notes (Signed)
I have discussed the procedure for the virtual visit with the patient who has given consent to proceed with assessment and treatment.   Nejla Reasor S Elba Dendinger, CMA     

## 2019-06-30 ENCOUNTER — Inpatient Hospital Stay: Payer: Medicare Other | Attending: Oncology | Admitting: Oncology

## 2019-06-30 ENCOUNTER — Inpatient Hospital Stay: Payer: Medicare Other

## 2019-06-30 ENCOUNTER — Other Ambulatory Visit: Payer: Self-pay

## 2019-06-30 ENCOUNTER — Telehealth: Payer: Self-pay

## 2019-06-30 VITALS — BP 178/61 | HR 65 | Temp 98.2°F | Resp 16 | Ht 68.0 in | Wt 216.9 lb

## 2019-06-30 DIAGNOSIS — K922 Gastrointestinal hemorrhage, unspecified: Secondary | ICD-10-CM | POA: Diagnosis not present

## 2019-06-30 DIAGNOSIS — Z79899 Other long term (current) drug therapy: Secondary | ICD-10-CM | POA: Insufficient documentation

## 2019-06-30 DIAGNOSIS — E119 Type 2 diabetes mellitus without complications: Secondary | ICD-10-CM | POA: Insufficient documentation

## 2019-06-30 DIAGNOSIS — K7469 Other cirrhosis of liver: Secondary | ICD-10-CM | POA: Insufficient documentation

## 2019-06-30 DIAGNOSIS — C189 Malignant neoplasm of colon, unspecified: Secondary | ICD-10-CM

## 2019-06-30 DIAGNOSIS — N2889 Other specified disorders of kidney and ureter: Secondary | ICD-10-CM | POA: Diagnosis not present

## 2019-06-30 DIAGNOSIS — D509 Iron deficiency anemia, unspecified: Secondary | ICD-10-CM | POA: Insufficient documentation

## 2019-06-30 DIAGNOSIS — I10 Essential (primary) hypertension: Secondary | ICD-10-CM | POA: Insufficient documentation

## 2019-06-30 DIAGNOSIS — C180 Malignant neoplasm of cecum: Secondary | ICD-10-CM | POA: Insufficient documentation

## 2019-06-30 LAB — CEA (IN HOUSE-CHCC): CEA (CHCC-In House): 5.35 ng/mL — ABNORMAL HIGH (ref 0.00–5.00)

## 2019-06-30 NOTE — Progress Notes (Signed)
[facility] OFFICE PROGRESS NOTE   Diagnosis: Colon cancer  INTERVAL HISTORY:   Cheryl Jordan returns as scheduled.  She feels well.  Good appetite and energy level.  No new complaint.  She continues to see Dr. Alyson Ingles for follow-up of the renal mass.  Objective:  Vital signs in last 24 hours:  Blood pressure (!) 178/61, pulse 65, temperature 98.2 F (36.8 C), temperature source Temporal, resp. rate 16, height 5' 8"  (1.727 m), weight 216 lb 14.4 oz (98.4 kg), SpO2 98 %.   Limited physical examination secondary to distancing with the Covid plan Lymphatics: No cervical, supraclavicular, axillary, or inguinal nodes GI: No hepatosplenomegaly, no mass, nontender Vascular: No leg edema     Lab Results:  Lab Results  Component Value Date   WBC 4.7 01/29/2019   HGB 12.9 01/29/2019   HCT 39.2 01/29/2019   MCV 91.9 01/29/2019   PLT 98.0 (L) 01/29/2019   NEUTROABS 2.7 01/29/2019    CMP  Lab Results  Component Value Date   NA 141 01/29/2019   K 4.6 01/29/2019   CL 106 01/29/2019   CO2 26 01/29/2019   GLUCOSE 187 (H) 01/29/2019   BUN 15 01/29/2019   CREATININE 0.97 01/29/2019   CALCIUM 9.1 01/29/2019   PROT 6.5 01/29/2019   ALBUMIN 4.0 01/29/2019   AST 48 (H) 01/29/2019   ALT 28 01/29/2019   ALKPHOS 118 (H) 01/29/2019   BILITOT 1.1 01/29/2019   GFRNONAA 51 (L) 11/17/2017   GFRAA 59 (L) 11/17/2017    Lab Results  Component Value Date   CEA1 5.35 (H) 06/30/2019    Medications: I have reviewed the patient's current medications.   Assessment/Plan: 1. 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 ? Loss of MLH1 and PMS2 expression ? Microsatellite instability-high ? CT chest 03/03/2015-negative  for metastatic disease ? Cycle 1 adjuvant Xeloda 03/18/2015 ? Cycle 2 adjuvant Xeloda 04/09/2015 ? Cycle 3 adjuvant Xeloda 04/30/2015 ? Cycle 4 adjuvant Xeloda 05/21/2015 ? Cycle 5 adjuvant Xeloda 06/11/2015 (dose reduced due to desquamation of feet). ? Cycle 6 adjuvant Xeloda 07/02/2015 ? Cycle 7 adjuvant Xeloda 07/23/2015 ? Cycle 8 adjuvant Xeloda 08/13/2015 ? CTs chest, abdomen, and pelvis 02/06/2016-cirrhosis, heterogenous enhancement of the liver, small hypoenhancing lesions in the liver-? 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 Likely regenerative nodules in the liver;does not appear as HCCor metastases. Left renal mass very slow growing. CT scan recommended in one year) ? Colonoscopy 08/06/2016-multiple 5-15 mm very subtle sessile serrated adenomasfound in the transverse colon and at the anastomosis. Next colonoscopy at a one-year interval. ? CT 12/23/2017- no evidence of recurrent colon cancer, cirrhotic liver with no dominant mass, left renal mass-cystic component mildly larger compared to 2017  2. Anemia, microcytic. Likely iron deficiency from previous GI blood loss. Oral iron initiated 03/17/2015. Improved. 3. 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 She is followed by Dr. Alyson Ingles, urology.Unchanged on CT 02/06/2016, unchanged on MRI 12/06/2016, cystic component slightly larger on CT 12/23/2017 4. Diabetes 5. Hypertension 6. History of mild hand/foot syndrome secondary to Xeloda 7. Enlarging segment 8 liver lesion noted on MRI 12/06/2016-felt to potentially represent a metastasis or HCC  Ultrasound-guided biopsy of the lesion on 02/06/2017 revealed cirrhosis, no malignancy  CT 12/23/2017- chronic liver  nodularity, no dominant mass, changes of cirrhosis 8. Cirrhosis 9. History of mild thrombocytopenia-likely secondary to cirrhosis     Disposition: Cheryl Jordan is in clinical remission from  colon cancer.  The CEA mildly elevated and stable.  The CEA has been chronically elevated.  This may be to a normal variant or the renal cell mass.  There is no clinical evidence for progression of the colon cancer.  She will return for office visit and CEA in 6 months.  [name], MD  06/30/2019  3:00 PM

## 2019-06-30 NOTE — Telephone Encounter (Signed)
TC to patient per DR Benay Spice to let her know cea is stable, f/u as scheduled. Patient verbalized understanding. No further problems or concerns at this time.

## 2019-07-01 ENCOUNTER — Telehealth: Payer: Self-pay | Admitting: Oncology

## 2019-07-01 NOTE — Telephone Encounter (Signed)
Scheduled per los. Called and left msg. Mailed printout  °

## 2019-07-02 ENCOUNTER — Telehealth: Payer: Self-pay | Admitting: Physician Assistant

## 2019-07-02 NOTE — Telephone Encounter (Signed)
Yes she needs appointment for a BP that elevated. In office preferred. ER for any chest pain, racing heart, SOB, LH or dizziness

## 2019-07-02 NOTE — Telephone Encounter (Signed)
Spoke with patient about her blood pressure. She has a blood pressure monitor at home but it doesn't work as well. She has taken her medications this morning. She is having headache, denies chest pains, sob, edema or vision changes. Her last  BP at Oncology was 178/61  Please advise

## 2019-07-02 NOTE — Telephone Encounter (Signed)
Michae Kava a nurse that does AWV with pt's insurance called in stating that pt's BP was 190/82 then 198/84 all while sitting. She states that pt did take her medication as directed this morning and is not having any other symptoms. Please advise if pt needs an appt to have [facility] evaluate her. Please call pt at the home #

## 2019-07-03 ENCOUNTER — Other Ambulatory Visit: Payer: Self-pay

## 2019-07-03 ENCOUNTER — Ambulatory Visit (INDEPENDENT_AMBULATORY_CARE_PROVIDER_SITE_OTHER): Payer: Medicare Other | Admitting: Physician Assistant

## 2019-07-03 ENCOUNTER — Encounter: Payer: Self-pay | Admitting: Physician Assistant

## 2019-07-03 VITALS — BP 162/76 | HR 60 | Temp 98.3°F | Resp 16 | Ht 68.0 in | Wt 216.0 lb

## 2019-07-03 DIAGNOSIS — E1169 Type 2 diabetes mellitus with other specified complication: Secondary | ICD-10-CM

## 2019-07-03 DIAGNOSIS — E785 Hyperlipidemia, unspecified: Secondary | ICD-10-CM

## 2019-07-03 DIAGNOSIS — E1159 Type 2 diabetes mellitus with other circulatory complications: Secondary | ICD-10-CM

## 2019-07-03 DIAGNOSIS — I10 Essential (primary) hypertension: Secondary | ICD-10-CM | POA: Diagnosis not present

## 2019-07-03 LAB — COMPREHENSIVE METABOLIC PANEL
ALT: 25 U/L (ref 0–35)
AST: 47 U/L — ABNORMAL HIGH (ref 0–37)
Albumin: 3.6 g/dL (ref 3.5–5.2)
Alkaline Phosphatase: 103 U/L (ref 39–117)
BUN: 15 mg/dL (ref 6–23)
CO2: 26 mEq/L (ref 19–32)
Calcium: 8.9 mg/dL (ref 8.4–10.5)
Chloride: 105 mEq/L (ref 96–112)
Creatinine, Ser: 1.01 mg/dL (ref 0.40–1.20)
GFR: 53.09 mL/min — ABNORMAL LOW (ref >60.00)
Glucose, Bld: 302 mg/dL — ABNORMAL HIGH (ref 70–99)
Potassium: 4.2 mEq/L (ref 3.5–5.1)
Sodium: 137 mEq/L (ref 135–145)
Total Bilirubin: 0.8 mg/dL (ref 0.2–1.2)
Total Protein: 6.2 g/dL (ref 6.0–8.3)

## 2019-07-03 LAB — HEMOGLOBIN A1C: Hgb A1c MFr Bld: 7.3 % — ABNORMAL HIGH (ref 4.6–6.5)

## 2019-07-03 MED ORDER — ACCU-CHEK AVIVA DEVI: 0 refills | Status: DC

## 2019-07-03 MED ORDER — LOSARTAN POTASSIUM 25 MG PO TABS: 25.0000 mg | ORAL_TABLET | Freq: Every day | ORAL | 1 refills | Status: DC

## 2019-07-03 NOTE — Patient Instructions (Signed)
Please go to the lab today for blood work.  I will call you with your results. We will alter treatment regimen(s) if indicated by your results.   Please continue amlodipine and atenolol at current dose.  Start the losartan once daily. Keep low-salt diet.  We will follow-up in 2 weeks for reassessment.  I have sent in a new glucometer for you. The insurance will not pay for the BP cuff per what my EMR is telling me. Would ask pharmacy for a good, cost effective brand to get over-the-counter.  Check BP at home and record. Bring to follow-up visit.  If you note BP not improving call me. If you note chest pain, vision changes, racing heart, lightheadedness or dizziness, you need ER assessment.

## 2019-07-03 NOTE — Progress Notes (Signed)
Patient presents to clinic today due to recent elevated blood pressure despite taking her amlodipine and atenolol as directed.  At recent visit with oncology, blood pressure was noted to be elevated in the 188Q systolic.  Had a home wellness evaluation yesterday through insurance.  Nurse noted BP at 190/100.  Patient endorses occasional headaches. Patient denies chest pain, palpitations, lightheadedness, dizziness, vision changes or frequent headaches.  Denies any change to diet.  Denies recent use of steroid or NSAID.  Occasional use of Tylenol.  Some increased stress recently but denies anxiety or depressed mood.  Past Medical History:  Diagnosis Date  . Allergy   . Anemia    hx of  . Arthritis   . Colon cancer ([place]) dx'd 11/2014  . Diabetes mellitus without complication ([place])    TYPE II  . GERD (gastroesophageal reflux disease)   . History of gout   . Hypertension   . Hypothyroidism   . PONV (postoperative nausea and vomiting)     Current Outpatient Medications on File Prior to Visit  Medication Sig Dispense Refill  . ACCU-CHEK AVIVA PLUS test strip     . amLODipine (NORVASC) 2.5 MG tablet Take 1 tablet (2.5 mg total) by mouth daily. 90 tablet 1  . atenolol (TENORMIN) 50 MG tablet Take 1 tablet (50 mg total) by mouth every morning. 90 tablet 1  . calcium citrate-vitamin D (CITRACAL+D) 315-200 MG-UNIT tablet Take 1 tablet by mouth 2 (two) times daily.    Marland Kitchen glipiZIDE (GLUCOTROL XL) 5 MG 24 hr tablet Take 1 tablet (5 mg total) by mouth daily with breakfast. 90 tablet 1  . levothyroxine (SYNTHROID) 88 MCG tablet Take 1 tablet (88 mcg total) by mouth daily. 90 tablet 1  . LORazepam (ATIVAN) 0.5 MG tablet Take 0.5 mg by mouth every 8 (eight) hours as needed. for anxiety  0  . omeprazole (PRILOSEC) 20 MG capsule Take 1 capsule (20 mg total) by mouth daily. 90 capsule 1  . alendronate (FOSAMAX) 70 MG tablet Take 1 tablet (70 mg total) by mouth every 7 (seven) days. Take with a full glass of  water on an empty stomach. (Patient not taking: Reported on 06/30/2019) 12 tablet 1   No current facility-administered medications on file prior to visit.    Allergies  Allergen Reactions  . Aspirin Other (See Comments)    Runny nose   . Codeine Nausea And Vomiting    Family History  Problem Relation Age of Onset  . Diabetes Mother   . Hypertension Other   . Heart disease Father   . Heart attack Brother   . COPD Brother     Social History   Socioeconomic History  . Marital status: Married    Spouse name: Not on file  . Number of children: Not on file  . Years of education: Not on file  . Highest education level: Not on file  Occupational History  . Occupation: Retired  Tobacco Use  . Smoking status: Never Smoker  . Smokeless tobacco: Never Used  Substance and Sexual Activity  . Alcohol use: No  . Drug use: No  . Sexual activity: Never  Other Topics Concern  . Not on file  Social History Narrative   03/03/15-Married, husband Stefanie Libel 90 years   #2 grown sons and #2 grand daughters   Has outdoor cat   Retired from [employer] to garden and can food when well   Social Determinants of Health  Financial Resource Strain:   . Difficulty of Paying Living Expenses: Not on file  Food Insecurity:   . Worried About Charity fundraiser in the Last Year: Not on file  . Ran Out of Food in the Last Year: Not on file  Transportation Needs:   . Lack of Transportation (Medical): Not on file  . Lack of Transportation (Non-Medical): Not on file  Physical Activity:   . Days of Exercise per Week: Not on file  . Minutes of Exercise per Session: Not on file  Stress:   . Feeling of Stress : Not on file  Social Connections:   . Frequency of Communication with Friends and Family: Not on file  . Frequency of Social Gatherings with Friends and Family: Not on file  . Attends Religious Services: Not on file  . Active Member of Clubs or Organizations: Not on file  .  Attends Archivist Meetings: Not on file  . Marital Status: Not on file   Review of Systems - See HPI.  All other ROS are negative.  BP (!) 162/76 (BP Location: Right Arm)   Pulse 60   Temp 98.3 F (36.8 C) (Temporal)   Resp 16   Ht 5' 8"  (1.727 m)   Wt 216 lb (98 kg)   SpO2 97%   BMI 32.84 kg/m   Physical Exam Vitals reviewed.  Constitutional:      Appearance: Normal appearance.  HENT:     Head: Normocephalic and atraumatic.     Nose: Nose normal.  Eyes:     Conjunctiva/sclera: Conjunctivae normal.     Pupils: Pupils are equal, round, and reactive to light.  Cardiovascular:     Rate and Rhythm: Normal rate and regular rhythm.     Pulses: Normal pulses.     Heart sounds: Normal heart sounds.  Pulmonary:     Effort: Pulmonary effort is normal.     Breath sounds: Normal breath sounds.  Musculoskeletal:     Cervical back: Neck supple.  Neurological:     Mental Status: She is alert.  Psychiatric:        Mood and Affect: Mood normal.     Recent Results (from the past 2160 hour(s))  HM DIABETES EYE EXAM     Status: None   Collection Time: 04/13/19 12:00 AM  Result Value Ref Range   HM Diabetic Eye Exam No Retinopathy No Retinopathy  Novel Coronavirus, NAA ([facility])     Status: None   Collection Time: 04/16/19 12:00 AM   Specimen: Oropharyngeal(OP) collection in vial transport medium   OROPHARYNGEA  TESTING  Result Value Ref Range   SARS-CoV-2, NAA Not Detected Not Detected    Comment: Testing was performed using the cobas(R) SARS-CoV-2 test. This nucleic acid amplification test was developed and its performance characteristics determined by [employer], [employer] and Company. Nucleic acid amplification tests include PCR and TMA. This test has not been FDA cleared or approved. This test has been authorized by FDA under an Emergency Use Authorization (EUA). This test is only authorized for the duration of time the declaration that circumstances exist justifying the  authorization of the emergency use of in vitro diagnostic tests for detection of SARS-CoV-2 virus and/or diagnosis of COVID-19 infection under section 564(b)(1) of the Act, 21 U.S.C. 132GMW-1(U) (1), unless the authorization is terminated or revoked sooner. When diagnostic testing is negative, the possibility of a false negative result should be considered in the context of a patient's recent exposures and the presence of  clinical signs and symptoms consistent with COVID-19. An individual without symptoms  of COVID-19 and who is not shedding SARS-CoV-2 virus would expect to have a negative (not detected) result in this assay.   CEA (IN HOUSE-[facility])     Status: Abnormal   Collection Time: 06/30/19 11:14 AM  Result Value Ref Range   CEA ([facility]-In House) 5.35 (H) 0.00 - 5.00 ng/mL    Comment: (NOTE) This test was performed using [employer]'s Chemiluminescent Microparticle Immunoassay. Values obtained from different assay methods cannot be used interchangeably. Please note that 5-10% of patients who smoke may see CEA levels up to 6.9 ng/mL. Performed at [facility], [place] [address] [place], [place] [address]     Assessment/Plan: 1. DM type 2 with diabetic dyslipidemia [facility]) Patient due for diabetic follow-up.  Is taking medications as directed.  Starting ARB today.  Flu shot up-to-date.  Pneumonia postponed.  Order for new glucometer sent in to pharmacy.  Will check fasting labs today.  Will adjust medication regimen accordingly. - Comp Met (CMET) - Hemoglobin A1c - Blood Glucose Monitoring Suppl (ACCU-CHEK AVIVA) device; Use as instructed  Dispense: 1 each; Refill: 0  2. Hypertension associated with diabetes ([place]) Significantly above goal.  Asymptomatic except for mild occasional headache.  BP initially 180/80 but on recheck down to 162/76.  We will have her continue atenolol and amlodipine at current doses.  Do not want to increase these due to lower resting  heart rate.  Add on losartan 25 mg once daily.  This will help with blood pressure and offer renovascular protection from diabetes.  DASH diet encouraged.  She is to get a new home blood pressure monitor and check BP daily.  She is to report these.  Follow-up in office has been scheduled.  Strict ER precautions reviewed with patient. - Comp Met (CMET) - Hemoglobin A1c - losartan (COZAAR) 25 MG tablet; Take 1 tablet (25 mg total) by mouth daily.  Dispense: 30 tablet; Refill: 1  This visit occurred during the SARS-CoV-2 public health emergency.  Safety protocols were in place, including screening questions prior to the visit, additional usage of staff PPE, and extensive cleaning of exam room while observing appropriate contact time as indicated for disinfecting solutions.    Leeanne Rio, PA-C

## 2019-07-03 NOTE — Telephone Encounter (Signed)
Appt has been scheduled for today at 6

## 2019-07-13 ENCOUNTER — Other Ambulatory Visit: Payer: Self-pay

## 2019-07-13 MED ORDER — ATENOLOL 50 MG PO TABS: 50.0000 mg | ORAL_TABLET | Freq: Every morning | ORAL | 1 refills | Status: DC

## 2019-07-20 ENCOUNTER — Ambulatory Visit: Payer: Medicare Other | Admitting: Physician Assistant

## 2019-07-23 ENCOUNTER — Other Ambulatory Visit: Payer: Self-pay

## 2019-07-24 ENCOUNTER — Encounter: Payer: Self-pay | Admitting: Physician Assistant

## 2019-07-24 ENCOUNTER — Ambulatory Visit (INDEPENDENT_AMBULATORY_CARE_PROVIDER_SITE_OTHER): Payer: Medicare Other | Admitting: Physician Assistant

## 2019-07-24 VITALS — BP 178/78 | HR 58 | Temp 98.3°F | Resp 16 | Ht 68.0 in | Wt 218.4 lb

## 2019-07-24 DIAGNOSIS — I10 Essential (primary) hypertension: Secondary | ICD-10-CM | POA: Diagnosis not present

## 2019-07-24 DIAGNOSIS — E1159 Type 2 diabetes mellitus with other circulatory complications: Secondary | ICD-10-CM

## 2019-07-24 MED ORDER — TELMISARTAN 20 MG PO TABS: 20.0000 mg | ORAL_TABLET | Freq: Every day | ORAL | 0 refills | Status: DC

## 2019-07-24 NOTE — Progress Notes (Signed)
Patient presents to clinic today for follow-up of hypertension. At last visit, losartan 25 mg was added to her regimen of Amlodipine 2.5 mg and Atenolol 50 mg daily. Endorses taking for a few days but noting issue with lightheadedness 30 min- 1 hour after taking each time. Switched to nighttime dose and not having much issue but still not noting improvement in home BP. Patient denies chest pain, palpitations, lightheadedness, dizziness, vision changes or frequent headaches. Of note, patient endorses she just received cortisone injections in knees bilaterally and is wondering if this is contributing to BP elevation. No NSAID use or alcohol.   Past Medical History:  Diagnosis Date  . Allergy   . Anemia    hx of  . Arthritis   . Colon cancer ([place]) dx'd 11/2014  . Diabetes mellitus without complication ([place])    TYPE II  . GERD (gastroesophageal reflux disease)   . History of gout   . Hypertension   . Hypothyroidism   . PONV (postoperative nausea and vomiting)     Current Outpatient Medications on File Prior to Visit  Medication Sig Dispense Refill  . ACCU-CHEK AVIVA PLUS test strip     . alendronate (FOSAMAX) 70 MG tablet Take 1 tablet (70 mg total) by mouth every 7 (seven) days. Take with a full glass of water on an empty stomach. (Patient not taking: Reported on 06/30/2019) 12 tablet 1  . amLODipine (NORVASC) 2.5 MG tablet Take 1 tablet (2.5 mg total) by mouth daily. 90 tablet 1  . atenolol (TENORMIN) 50 MG tablet Take 1 tablet (50 mg total) by mouth every morning. 90 tablet 1  . Blood Glucose Monitoring Suppl (ACCU-CHEK AVIVA) device Use as instructed 1 each 0  . calcium citrate-vitamin D (CITRACAL+D) 315-200 MG-UNIT tablet Take 1 tablet by mouth 2 (two) times daily.    Marland Kitchen glipiZIDE (GLUCOTROL XL) 5 MG 24 hr tablet Take 1 tablet (5 mg total) by mouth daily with breakfast. 90 tablet 1  . levothyroxine (SYNTHROID) 88 MCG tablet Take 1 tablet (88 mcg total) by mouth daily. 90 tablet 1  .  LORazepam (ATIVAN) 0.5 MG tablet Take 0.5 mg by mouth every 8 (eight) hours as needed. for anxiety  0  . losartan (COZAAR) 25 MG tablet Take 1 tablet (25 mg total) by mouth daily. 30 tablet 1  . omeprazole (PRILOSEC) 20 MG capsule Take 1 capsule (20 mg total) by mouth daily. 90 capsule 1   No current facility-administered medications on file prior to visit.    Allergies  Allergen Reactions  . Aspirin Other (See Comments)    Runny nose   . Codeine Nausea And Vomiting    Family History  Problem Relation Age of Onset  . Diabetes Mother   . Hypertension Other   . Heart disease Father   . Heart attack Brother   . COPD Brother     Social History   Socioeconomic History  . Marital status: Married    Spouse name: Not on file  . Number of children: Not on file  . Years of education: Not on file  . Highest education level: Not on file  Occupational History  . Occupation: Retired  Tobacco Use  . Smoking status: Never Smoker  . Smokeless tobacco: Never Used  Substance and Sexual Activity  . Alcohol use: No  . Drug use: No  . Sexual activity: Never  Other Topics Concern  . Not on file  Social History Narrative   03/03/15-Married, husband Jeneen Rinks  X 52 years   #2 grown sons and #2 grand daughters   Has outdoor cat   Retired from [employer] to garden and can food when well   Social Determinants of Radio broadcast assistant Strain:   . Difficulty of Paying Living Expenses: Not on file  Food Insecurity:   . Worried About Charity fundraiser in the Last Year: Not on file  . Ran Out of Food in the Last Year: Not on file  Transportation Needs:   . Lack of Transportation (Medical): Not on file  . Lack of Transportation (Non-Medical): Not on file  Physical Activity:   . Days of Exercise per Week: Not on file  . Minutes of Exercise per Session: Not on file  Stress:   . Feeling of Stress : Not on file  Social Connections:   . Frequency of Communication with Friends  and Family: Not on file  . Frequency of Social Gatherings with Friends and Family: Not on file  . Attends Religious Services: Not on file  . Active Member of Clubs or Organizations: Not on file  . Attends Archivist Meetings: Not on file  . Marital Status: Not on file   Review of Systems - See HPI.  All other ROS are negative.  BP (!) 178/78   Pulse (!) 58   Temp 98.3 F (36.8 C) (Temporal)   Resp 16   Ht 5' 8"  (1.727 m)   Wt 218 lb 6.4 oz (99.1 kg)   SpO2 98%   BMI 33.21 kg/m   Physical Exam Vitals reviewed.  Constitutional:      Appearance: Normal appearance.  HENT:     Head: Normocephalic and atraumatic.  Cardiovascular:     Rate and Rhythm: Normal rate and regular rhythm.     Pulses: Normal pulses.     Heart sounds: Normal heart sounds.  Pulmonary:     Effort: Pulmonary effort is normal.     Breath sounds: Normal breath sounds.  Musculoskeletal:     Cervical back: Neck supple.  Neurological:     Mental Status: She is alert.  Psychiatric:        Mood and Affect: Mood normal.     Recent Results (from the past 2160 hour(s))  CEA (IN HOUSE-[facility])     Status: Abnormal   Collection Time: 06/30/19 11:14 AM  Result Value Ref Range   CEA ([facility]-In House) 5.35 (H) 0.00 - 5.00 ng/mL    Comment: (NOTE) This test was performed using Architect's Chemiluminescent Microparticle Immunoassay. Values obtained from different assay methods cannot be used interchangeably. Please note that 5-10% of patients who smoke may see CEA levels up to 6.9 ng/mL. Performed at [facility], [place] [address], [place], [place] [address]   Comp Met (CMET)     Status: Abnormal   Collection Time: 07/03/19 11:25 AM  Result Value Ref Range   Sodium 137 135 - 145 mEq/L   Potassium 4.2 3.5 - 5.1 mEq/L   Chloride 105 96 - 112 mEq/L   CO2 26 19 - 32 mEq/L   Glucose, Bld 302 (H) 70 - 99 mg/dL   BUN 15 6 - 23 mg/dL   Creatinine, Ser 1.01 0.40 - 1.20 mg/dL   Total  Bilirubin 0.8 0.2 - 1.2 mg/dL   Alkaline Phosphatase 103 39 - 117 U/L   AST 47 (H) 0 - 37 U/L   ALT 25 0 - 35 U/L  Total Protein 6.2 6.0 - 8.3 g/dL   Albumin 3.6 3.5 - 5.2 g/dL   GFR 53.09 (L) >60.00 mL/min   Calcium 8.9 8.4 - 10.5 mg/dL  Hemoglobin A1c     Status: Abnormal   Collection Time: 07/03/19 11:25 AM  Result Value Ref Range   Hgb A1c MFr Bld 7.3 (H) 4.6 - 6.5 %    Comment: Glycemic Control Guidelines for People with Diabetes:Non Diabetic:  <6%Goal of Therapy: <7%Additional Action Suggested:  >8%     Assessment/Plan: 1. Hypertension associated with diabetes ([place]) Unable to tolerate the Losartan in th day and no improvement with BP. Wish could increase her CCB but pulse already low with current dose and BB and do not want to worsen this. Has history of gout so trying to avoid thiazides. Will switch to Micardis for a trial over the next week -- first to make sure she tolerates -- and will follow-up via phone to make further adjustments in dose.    Leeanne Rio, PA-C

## 2019-07-24 NOTE — Patient Instructions (Signed)
Please keep hydrated and keep a low-salt diet.  Stop the Losartan. Start the Micardis as directed. Continue other medications as directed. As the injections get our of your system, blood pressure should improve so maybe we can come off of this new medication.  Check BP daily and record. We will follow-up in 10-14 days, sooner if needed.

## 2019-08-07 ENCOUNTER — Other Ambulatory Visit: Payer: Self-pay

## 2019-08-07 ENCOUNTER — Ambulatory Visit (INDEPENDENT_AMBULATORY_CARE_PROVIDER_SITE_OTHER): Payer: Medicare Other | Admitting: Physician Assistant

## 2019-08-07 ENCOUNTER — Encounter: Payer: Self-pay | Admitting: Physician Assistant

## 2019-08-07 VITALS — BP 180/73 | HR 63

## 2019-08-07 DIAGNOSIS — E1159 Type 2 diabetes mellitus with other circulatory complications: Secondary | ICD-10-CM | POA: Diagnosis not present

## 2019-08-07 DIAGNOSIS — I10 Essential (primary) hypertension: Secondary | ICD-10-CM

## 2019-08-07 MED ORDER — TELMISARTAN 40 MG PO TABS: 40.0000 mg | ORAL_TABLET | Freq: Every day | ORAL | 1 refills | Status: DC

## 2019-08-07 NOTE — Addendum Note (Signed)
Addended by: Brunetta Jeans on: 08/07/2019 10:33 AM   Modules accepted: Level of Service

## 2019-08-07 NOTE — Progress Notes (Signed)
I have discussed the procedure for the virtual visit with the patient who has given consent to proceed with assessment and treatment.   Murdock Jellison S Arend Bahl, CMA     

## 2019-08-07 NOTE — Progress Notes (Signed)
Virtual Visit via Telephone Note  I connected with Cheryl Jordan on 08/07/19 at  9:00 AM EST by telephone and verified that I am speaking with the correct person using two identifiers.  Location: Patient: Home Provider: [place]   I discussed the limitations, risks, security and privacy concerns of performing an evaluation and management service by telephone and the availability of in person appointments. I also discussed with the patient that there may be a patient responsible charge related to this service. The patient expressed understanding and agreed to proceed.  History of Present Illness: Patient presents via phone today to follow-up regarding hypertension.  At last visit patient was switched from losartan to telmisartan 20 mg due to some noted intolerance to losartan.  Endorses taking medication as directed.  Tolerating much better than the losartan.  Notes blood pressure slightly improved down to 370W systolically when she checks.  Notes occasional headache.  Denies vision changes, chest pain, shortness of breath, palpitations, lightheadedness or dizziness.  Blood pressure elevated this morning.  Has not taken her blood pressure medication yet.  Denies new concerns today.   Observations/Objective: No labored breathing.  Speech is clear and coherent with logical content.  Patient is alert and oriented at baseline.   Assessment and Plan: 1. Hypertension associated with diabetes ([place]) Tolerating telmisartan well which was our first goal.  We will now increase to get her to therapeutic blood pressure level.  Will increase to 40 mg once daily.  Continue other antihypertensive medications as directed.  Continue DASH diet.  Patient to check blood pressure daily at least 1 hour after taking BP medications, while having resting for 5 minutes prior to check.  We will call her in 1 week to get updated blood pressure readings so further adjustments can be  made.   Follow Up Instructions:  I discussed the assessment and treatment plan with the patient. The patient was provided an opportunity to ask questions and all were answered. The patient agreed with the plan and demonstrated an understanding of the instructions.   The patient was advised to call back or seek an in-person evaluation if the symptoms worsen or if the condition fails to improve as anticipated.  I provided 10 minutes of non-face-to-face time during this encounter.   Leeanne Rio, PA-C

## 2019-08-14 ENCOUNTER — Telehealth: Payer: Self-pay | Admitting: Physician Assistant

## 2019-08-14 NOTE — Telephone Encounter (Signed)
Called patient to get updated bp readings. Patient states her readings are as follows: Jan 22nd 155/69, Jan 23rd 160/76, Jan 24th 169/71, Jan 26 147/73, Jan 27 161/72, Jan 28th 132/65. Patient states she takes her bp 11-11:30 daily and tries to relax before taking it. She states she was only able to take the Micardis 40 mg once because it gave her a terrible headache and dropped her heart rate down to 52. Patient states she had some 20mg  of the Micardis left and has been taking a 20mg  in the morning and one at night. She states that is working better for her and is not dropping her heart rate. Her heart rate has now been running 60-70 bpm.

## 2019-08-14 NOTE — Telephone Encounter (Signed)
-----   Message from Brunetta Jeans, PA-C sent at 08/07/2019  9:43 AM EST ----- Call patient for updated blood pressure measurements.

## 2019-08-17 NOTE — Telephone Encounter (Signed)
Micardis should not cause lowering of heart rate as this is not how the medication works.  I am fine with her taking the medication -- 20 mg twice daily -- if she tolerates better. She needs to be consistent with this for the next 10 days or so so we can see truly what the medication is going to do. Then we can make further adjustments.

## 2019-08-17 NOTE — Telephone Encounter (Signed)
Spoke with patient and she is agreeable with taking the Micardis bid to tolerate better. She will continue to monitor her blood pressures and call with the readings.

## 2019-10-26 ENCOUNTER — Other Ambulatory Visit: Payer: Self-pay | Admitting: Physician Assistant

## 2019-12-03 ENCOUNTER — Other Ambulatory Visit: Payer: Self-pay | Admitting: Physician Assistant

## 2019-12-03 DIAGNOSIS — E785 Hyperlipidemia, unspecified: Secondary | ICD-10-CM

## 2019-12-04 ENCOUNTER — Other Ambulatory Visit: Payer: Self-pay

## 2019-12-04 ENCOUNTER — Encounter: Payer: Self-pay | Admitting: Physician Assistant

## 2019-12-04 ENCOUNTER — Other Ambulatory Visit: Payer: Self-pay | Admitting: Emergency Medicine

## 2019-12-04 ENCOUNTER — Ambulatory Visit (INDEPENDENT_AMBULATORY_CARE_PROVIDER_SITE_OTHER): Payer: Medicare Other | Admitting: Physician Assistant

## 2019-12-04 VITALS — BP 138/62 | HR 64 | Temp 98.3°F | Resp 16 | Ht 68.0 in | Wt 213.0 lb

## 2019-12-04 DIAGNOSIS — R300 Dysuria: Secondary | ICD-10-CM

## 2019-12-04 LAB — POCT URINALYSIS DIPSTICK
Bilirubin, UA: NEGATIVE
Blood, UA: NEGATIVE
Glucose, UA: NEGATIVE
Ketones, UA: NEGATIVE
Nitrite, UA: NEGATIVE
Protein, UA: POSITIVE — AB
Spec Grav, UA: 1.015 (ref 1.010–1.025)
Urobilinogen, UA: 0.2 E.U./dL
pH, UA: 6 (ref 5.0–8.0)

## 2019-12-04 MED ORDER — LEVOTHYROXINE SODIUM 88 MCG PO TABS: 88.0000 ug | ORAL_TABLET | Freq: Every day | ORAL | 2 refills | Status: AC

## 2019-12-04 MED ORDER — OMEPRAZOLE 20 MG PO CPDR: 20.0000 mg | DELAYED_RELEASE_CAPSULE | Freq: Every day | ORAL | 2 refills | Status: DC

## 2019-12-04 MED ORDER — CEPHALEXIN 500 MG PO CAPS: 500.0000 mg | ORAL_CAPSULE | Freq: Two times a day (BID) | ORAL | 0 refills | Status: AC

## 2019-12-04 MED ORDER — AMLODIPINE BESYLATE 2.5 MG PO TABS: 2.5000 mg | ORAL_TABLET | Freq: Every day | ORAL | 2 refills | Status: DC

## 2019-12-04 NOTE — Patient Instructions (Signed)
Your symptoms are consistent with a bladder infection, also called acute cystitis. Please take your antibiotic (Keflex) as directed until all pills are gone.  Stay very well hydrated.  Consider a daily probiotic (Align, Culturelle, or Activia) to help prevent stomach upset caused by the antibiotic.  Taking a probiotic daily may also help prevent recurrent UTIs.  Also consider taking AZO (Phenazopyridine) tablets to help decrease pain with urination.  I will call you with your urine testing results.  We will change antibiotics if indicated.  Call or return to clinic if symptoms are not resolved by completion of antibiotic.   For the hip, apply topical Voltaren gel to the area. Tylenol if needed.    Urinary Tract Infection A urinary tract infection (UTI) can occur any place along the urinary tract. The tract includes the kidneys, ureters, bladder, and urethra. A type of germ called bacteria often causes a UTI. UTIs are often helped with antibiotic medicine.  HOME CARE   If given, take antibiotics as told by your doctor. Finish them even if you start to feel better.  Drink enough fluids to keep your pee (urine) clear or pale yellow.  Avoid tea, drinks with caffeine, and bubbly (carbonated) drinks.  Pee often. Avoid holding your pee in for a long time.  Pee before and after having sex (intercourse).  Wipe from front to back after you poop (bowel movement) if you are a woman. Use each tissue only once. GET HELP RIGHT AWAY IF:   You have back pain.  You have lower belly (abdominal) pain.  You have chills.  You feel sick to your stomach (nauseous).  You throw up (vomit).  Your burning or discomfort with peeing does not go away.  You have a fever.  Your symptoms are not better in 3 days. MAKE SURE YOU:   Understand these instructions.  Will watch your condition.  Will get help right away if you are not doing well or get worse. Document Released: 12/19/2007 Document Revised:  03/26/2012 Document Reviewed: 01/31/2012 [facility] Patient Information 2015 [place], [place]. This information is not intended to replace advice given to you by your health care provider. Make sure you discuss any questions you have with your health care provider.

## 2019-12-04 NOTE — Progress Notes (Signed)
Patient presents to clinic today c/o 2 days of dysuria, urinary frequency and low back pain.  Has noted some associated nausea without vomiting.  Denies objective fever but has noted some chills.  Denies flank pain, hematuria or urinary hesitancy.  Has been taking Tylenol for symptoms and trying to stay well-hydrated.  Past Medical History:  Diagnosis Date  . Allergy   . Anemia    hx of  . Arthritis   . Colon cancer ([place]) dx'd 11/2014  . Diabetes mellitus without complication ([place])    TYPE II  . GERD (gastroesophageal reflux disease)   . History of gout   . Hypertension   . Hypothyroidism   . PONV (postoperative nausea and vomiting)     Current Outpatient Medications on File Prior to Visit  Medication Sig Dispense Refill  . ACCU-CHEK AVIVA PLUS test strip     . alendronate (FOSAMAX) 70 MG tablet Take 1 tablet (70 mg total) by mouth every 7 (seven) days. Take with a full glass of water on an empty stomach. 12 tablet 1  . amLODipine (NORVASC) 2.5 MG tablet Take 1 tablet (2.5 mg total) by mouth daily. 90 tablet 1  . atenolol (TENORMIN) 50 MG tablet Take 1 tablet (50 mg total) by mouth every morning. 90 tablet 1  . Blood Glucose Monitoring Suppl (ACCU-CHEK AVIVA) device Use as instructed 1 each 0  . calcium citrate-vitamin D (CITRACAL+D) 315-200 MG-UNIT tablet Take 1 tablet by mouth 2 (two) times daily.    Marland Kitchen glipiZIDE (GLUCOTROL XL) 5 MG 24 hr tablet TAKE (1) TABLET DAILY WITH BREAKFAST. 90 tablet 1  . levothyroxine (SYNTHROID) 88 MCG tablet Take 1 tablet (88 mcg total) by mouth daily. 90 tablet 1  . LORazepam (ATIVAN) 0.5 MG tablet Take 0.5 mg by mouth every 8 (eight) hours as needed. for anxiety  0  . omeprazole (PRILOSEC) 20 MG capsule Take 1 capsule (20 mg total) by mouth daily. 90 capsule 1  . telmisartan (MICARDIS) 40 MG tablet TAKE 1 TABLET ONCE DAILY 90 tablet 1   No current facility-administered medications on file prior to visit.    Allergies  Allergen Reactions  .  Aspirin Other (See Comments)    Runny nose   . Codeine Nausea And Vomiting    Family History  Problem Relation Age of Onset  . Diabetes Mother   . Hypertension Other   . Heart disease Father   . Heart attack Brother   . COPD Brother     Social History   Socioeconomic History  . Marital status: Married    Spouse name: Not on file  . Number of children: Not on file  . Years of education: Not on file  . Highest education level: Not on file  Occupational History  . Occupation: Retired  Tobacco Use  . Smoking status: Never Smoker  . Smokeless tobacco: Never Used  Substance and Sexual Activity  . Alcohol use: No  . Drug use: No  . Sexual activity: Never  Other Topics Concern  . Not on file  Social History Narrative   03/03/15-Married, husband Stefanie Libel 43 years   #2 grown sons and #2 grand daughters   Has outdoor cat   Retired from [employer] to garden and can food when well   Social Determinants of Radio broadcast assistant Strain:   . Difficulty of Paying Living Expenses:   Food Insecurity:   . Worried About Charity fundraiser in the  Last Year:   . [place] in the Last Year:   Transportation Needs:   . Film/video editor (Medical):   Marland Kitchen Lack of Transportation (Non-Medical):   Physical Activity:   . Days of Exercise per Week:   . Minutes of Exercise per Session:   Stress:   . Feeling of Stress :   Social Connections:   . Frequency of Communication with Friends and Family:   . Frequency of Social Gatherings with Friends and Family:   . Attends Religious Services:   . Active Member of Clubs or Organizations:   . Attends Archivist Meetings:   Marland Kitchen Marital Status:    Review of Systems - See HPI.  All other ROS are negative.  BP 138/62   Pulse 64   Temp 98.3 F (36.8 C) (Temporal)   Resp 16   Ht 5\' 8"  (1.727 m)   Wt 213 lb (96.6 kg)   SpO2 98%   BMI 32.39 kg/m   Physical Exam Vitals reviewed.  Constitutional:       Appearance: Normal appearance.  HENT:     Head: Normocephalic and atraumatic.  Cardiovascular:     Rate and Rhythm: Normal rate and regular rhythm.     Pulses: Normal pulses.  Pulmonary:     Effort: Pulmonary effort is normal.  Abdominal:     General: Bowel sounds are normal.     Palpations: Abdomen is soft.     Tenderness: There is no abdominal tenderness. There is no right CVA tenderness or left CVA tenderness.  Musculoskeletal:     Cervical back: Neck supple.  Neurological:     Mental Status: She is alert.  Psychiatric:        Mood and Affect: Mood normal.     Recent Results (from the past 2160 hour(s))  POCT Urinalysis Dipstick     Status: Abnormal   Collection Time: 12/04/19  1:52 PM  Result Value Ref Range   Color, UA Dark Yellow    Clarity, UA Clear    Glucose, UA Negative Negative   Bilirubin, UA Negative    Ketones, UA Negative    Spec Grav, UA 1.015 1.010 - 1.025   Blood, UA Negative    pH, UA 6.0 5.0 - 8.0   Protein, UA Positive (A) Negative   Urobilinogen, UA 0.2 0.2 or 1.0 E.U./dL   Nitrite, UA Negative    Leukocytes, UA Small (1+) (A) Negative   Appearance     Odor      Assessment/Plan: 1. Dysuria Absence of alarm signs or symptoms.  Seems most consistent with uncomplicated cystitis.  UA with leukocytes and some protein noted.  Will send for culture.  Given current signs and symptoms and history of UTI will start empiric antibiotic treatment.  Rx Keflex 500 mg twice daily x7 days.  Supportive measures and OTC medications reviewed with patient.  Will alter treatment plan according to culture sensitivities. - POCT Urinalysis Dipstick - Urine Culture - cephALEXin (KEFLEX) 500 MG capsule; Take 1 capsule (500 mg total) by mouth 2 (two) times daily for 7 days.  Dispense: 14 capsule; Refill: 0   Leeanne Rio, PA-C

## 2019-12-05 LAB — URINE CULTURE
MICRO NUMBER:: 10506312
SPECIMEN QUALITY:: ADEQUATE

## 2019-12-21 ENCOUNTER — Ambulatory Visit: Payer: Medicare Other | Attending: Orthopedic Surgery | Admitting: Physical Therapy

## 2019-12-21 ENCOUNTER — Other Ambulatory Visit: Payer: Self-pay

## 2019-12-21 DIAGNOSIS — M25561 Pain in right knee: Secondary | ICD-10-CM | POA: Diagnosis present

## 2019-12-21 NOTE — Therapy (Signed)
[facility] [place], [place], [address] Phone: [phone]   Fax:  [phone]  Physical Therapy Evaluation  Patient Details  Name: Cheryl Jordan MRN: [id number] Date of Birth: 10-28-1940 Referring Provider (PT): Paralee Cancel MD   Encounter Date: 12/21/2019  PT End of Session - 12/21/19 1113    Visit Number  1    Number of Visits  8    Date for PT Re-Evaluation  01/18/20    PT Start Time  6546    PT Stop Time  1113    PT Time Calculation (min)  41 min       Past Medical History:  Diagnosis Date  . Allergy   . Anemia    hx of  . Arthritis   . Colon cancer ([place]) dx'd 11/2014  . Diabetes mellitus without complication ([place])    TYPE II  . GERD (gastroesophageal reflux disease)   . History of gout   . Hypertension   . Hypothyroidism   . PONV (postoperative nausea and vomiting)     Past Surgical History:  Procedure Laterality Date  . CHOLECYSTECTOMY    . IR RADIOLOGIST EVAL & MGMT  01/24/2017  . KNEE SURGERY    . NEPHRECTOMY Left 02/10/2015   Procedure: OPEN RETROPERINTONEAL EXPLORATION LEFT RENAL  CYST DECORTICATION X 5;  Surgeon: Cleon Gustin, MD;  Location: [facility];  Service: Urology;  Laterality: Left;  . PARTIAL COLECTOMY N/A 02/10/2015   Procedure: OPEN RIGHT  COLECTOMY ;  Surgeon: [name], MD;  Location: [facility];  Service: General;  Laterality: N/A;  . TUBAL LIGATION      There were no vitals filed for this visit.   Subjective Assessment - 12/21/19 1052    Subjective  COVID-19 screen performed prior to patient entering clinic.  The patient presents to the clinic today with c/o right knee pain.  She had arthroscopic surgery on 10/19/19 and states she did very well.  Approximately 2 weeks later her rightknee began to hurt for no apparent reason.  She states she was examined and found to have bursitis.  Increased activity increases pain and rest decrease pain.    Pertinent History  DM, HTN, hypothyroidism.     How long can you stand comfortably?  Varies.    How long can you walk comfortably?  Varies.    Patient Stated Goals  Get out of pain.    Currently in Pain?  Yes    Pain Score  4     Pain Location  Knee    Pain Orientation  Right    Pain Descriptors / Indicators  Sore    Pain Type  Acute pain    Pain Onset  1 to 4 weeks ago    Pain Frequency  Constant    Aggravating Factors   See above.    Pain Relieving Factors  See above.         [facility] PT Assessment - 12/21/19 0001      Assessment   Medical Diagnosis  Right knee pain.    Referring Provider (PT)  Paralee Cancel MD    Onset Date/Surgical Date  --   10/19/19.     Precautions   Precaution Comments  PAIN-FREE THER EX.      Restrictions   Weight Bearing Restrictions  No      Balance Screen   Has the patient fallen in the past 6 months  No    Has the patient had  a decrease in activity level because of a fear of falling?   No    Is the patient reluctant to leave their home because of a fear of falling?   No      Home Environment   Living Environment  Private residence      Prior Function   Level of Independence  Independent      Observation/Other Assessments-Edema    Edema  --   No right knee edema.     ROM / Strength   AROM / PROM / Strength  AROM;Strength      AROM   Overall AROM Comments  Full active right knee extension and flexion to 125 degrees.      Strength   Overall Strength Comments  Right hip and knee strength= 4+/5.      Palpation   Palpation comment  Tender to palaption over right knee Pes Anserine region.      Ambulation/Gait   Gait Comments  Mild gait antalgia.                  Objective measurements completed on examination: See above findings.      [facility] Adult PT Treatment/Exercise - 12/21/19 0001      Modalities   Modalities  Electrical Stimulation;Vasopneumatic      Electrical Stimulation   Electrical Stimulation Location  Right Pes Anserine.    Electrical Stimulation  Action  Pre-mod.    Electrical Stimulation Parameters  80-150 Hz x 15 minutes.    Electrical Stimulation Goals  Pain      Vasopneumatic   Number Minutes Vasopneumatic   15 minutes    Vasopnuematic Location   --   Right knee.   Vasopneumatic Pressure  Low                          Plan - 12/21/19 1101    Clinical Impression Statement  The patient presenst to OPPT with c/o right knee pain.  Her range of motion and strength are nearly normal.  She is tender to palpation over her right Pes Anserine area.  She has mild gait antalgia.  Patient will benefit from skilled physical therapy intervention to address deficits and pain.    Personal Factors and Comorbidities  Comorbidity 1;Comorbidity 2    Comorbidities  DM, HTN, hypothyroidism.    Examination-Activity Limitations  Stand;Other    Examination-Participation Restrictions  Other    Stability/Clinical Decision Making  Stable/Uncomplicated    Clinical Decision Making  Low    Rehab Potential  Excellent    PT Frequency  2x / week    PT Duration  4 weeks    PT Treatment/Interventions  ADLs/Self Care Home Management;Cryotherapy;Electrical Stimulation;Ultrasound;Moist Heat;Iontophoresis 4mg /ml Dexamethasone;Therapeutic activities;Functional mobility training;Therapeutic exercise;Manual techniques;Patient/family education;Vasopneumatic Device    PT Next Visit Plan  Pulsed combo/E'stim, Ionto (once cert is signed), e'stim/Vasopnuematic.  PAIN-FREE RT LE THER EX.    Consulted and Agree with Plan of Care  Patient       Patient will benefit from skilled therapeutic intervention in order to improve the following deficits and impairments:  Pain, Decreased activity tolerance, Decreased strength  Visit Diagnosis: Acute pain of right knee - Plan: PT plan of care cert/re-cert     Problem List Patient Active Problem List   Diagnosis Date Noted  . Abnormal finding on imaging of liver 02/01/2019  . Chronic kidney disease (CKD),  stage III (moderate) 12/09/2018  . Malignant neoplasm of colon ([place])  08/11/2015  . 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 Hypertension associated with diabetes ([place]) 12/17/2013  . Arthritis 12/17/2013  . Hypothyroidism 10/16/2011  . GERD (gastroesophageal reflux disease) 10/16/2011    Raigan Baria, [place] MPT 12/21/2019, 11:18 AM  [facility] [place], [place], [address] Phone: [phone]   Fax:  [phone]  Name: ABBYGALE LAPID MRN: [id number] Date of Birth: 23-Nov-1940

## 2019-12-24 ENCOUNTER — Other Ambulatory Visit: Payer: Self-pay

## 2019-12-24 ENCOUNTER — Ambulatory Visit: Payer: Medicare Other | Admitting: Physical Therapy

## 2019-12-24 ENCOUNTER — Encounter: Payer: Self-pay | Admitting: Physical Therapy

## 2019-12-24 DIAGNOSIS — M25561 Pain in right knee: Secondary | ICD-10-CM | POA: Diagnosis not present

## 2019-12-24 NOTE — Therapy (Addendum)
[facility] [place], [place], [address] Phone: [phone]   Fax:  [phone]  Physical Therapy Treatment  Patient Details  Name: Cheryl Jordan MRN: [id number] Date of Birth: [birth year] Referring Provider (PT): Paralee Cancel MD   Encounter Date: 12/24/2019   PT End of Session - 12/24/19 1100    Visit Number 2    Number of Visits 8    Date for PT Re-Evaluation 01/18/20    PT Start Time 0945    PT Stop Time 1031    PT Time Calculation (min) 46 min    Activity Tolerance Patient tolerated treatment well    Behavior During Therapy [facility] for tasks assessed/performed           Past Medical History:  Diagnosis Date  . Allergy   . Anemia    hx of  . Arthritis   . Colon cancer ([employer]) dx'd 11/2014  . Diabetes mellitus without complication ([place])    TYPE II  . GERD (gastroesophageal reflux disease)   . History of gout   . Hypertension   . Hypothyroidism   . PONV (postoperative nausea and vomiting)     Past Surgical History:  Procedure Laterality Date  . CHOLECYSTECTOMY    . IR RADIOLOGIST EVAL & MGMT  01/24/2017  . KNEE SURGERY    . NEPHRECTOMY Left 02/10/2015   Procedure: OPEN RETROPERINTONEAL EXPLORATION LEFT RENAL  CYST DECORTICATION X 5;  Surgeon: Cleon Gustin, MD;  Location: [facility];  Service: Urology;  Laterality: Left;  . PARTIAL COLECTOMY N/A 02/10/2015   Procedure: OPEN RIGHT  COLECTOMY ;  Surgeon: [name], MD;  Location: [facility];  Service: General;  Laterality: N/A;  . TUBAL LIGATION      There were no vitals filed for this visit.   Subjective Assessment - 12/24/19 1257    Subjective COVID-19 screening performed upon arrival. Patient arrives with some pain in R knee.    Pertinent History DM, HTN, hypothyroidism.    How long can you stand comfortably? Varies.    How long can you walk comfortably? Varies.    Patient Stated Goals Get out of pain.    Currently in Pain? Yes    Pain Score 4     Pain  Location Knee    Pain Orientation Right    Pain Descriptors / Indicators Sore    Pain Type Acute pain    Pain Onset 1 to 4 weeks ago    Pain Frequency Constant              [facility] PT Assessment - 12/24/19 0001      Assessment   Medical Diagnosis Right knee pain.    Referring Provider (PT) Paralee Cancel MD      Precautions   Precaution Comments PAIN-FREE THER EX.                         [facility] Adult PT Treatment/Exercise - 12/24/19 0001      Exercises   Exercises Knee/Hip      Knee/Hip Exercises: Aerobic   Nustep level 2 x10 mins      Modalities   Modalities Electrical Stimulation;Vasopneumatic;Ultrasound;Iontophoresis      Electrical Stimulation   Electrical Stimulation Location Right Pes Anserine.    Electrical Stimulation Action pre-mod    Electrical Stimulation Parameters 80-150 hz x10 mins    Electrical Stimulation Goals Pain      Ultrasound   Ultrasound Location  R pes anserine    Ultrasound Parameters 50%, 3.3 mhz, 1.5 w/cm2 x10 mins    Ultrasound Goals Pain      Iontophoresis   Type of Iontophoresis Dexamethasone    Location R pes anserine    Dose 1 ml    Time 8 mins (1 of 6)      Vasopneumatic   Number Minutes Vasopneumatic  10 minutes    Vasopnuematic Location  Knee    Vasopneumatic Pressure Low    Vasopneumatic Temperature  34                       PT Long Term Goals - 12/24/19 1259      PT LONG TERM GOAL #1   Title Independent with a HEP.    Time 4    Period Weeks    Status New      PT LONG TERM GOAL #2   Title Perform ADL's with right knee pain-level not to exceed a 3/10.    Time 4    Period Weeks    Status New                 Plan - 12/24/19 1100    Clinical Impression Statement Patient responded well to therapy session with only reports of slight increase of discomfort in right knee after the nustep. No reports of increased pain with pulsed combo. Patient educated on iontophoresis patch and removal  time. Patient reported understanding. No adverse affects upon removal of modalities.    Personal Factors and Comorbidities Comorbidity 1;Comorbidity 2    Comorbidities DM, HTN, hypothyroidism.    Examination-Activity Limitations Stand;Other    Examination-Participation Restrictions Other    Stability/Clinical Decision Making Stable/Uncomplicated    Clinical Decision Making Low    Rehab Potential Excellent    PT Frequency 2x / week    PT Duration 4 weeks    PT Treatment/Interventions ADLs/Self Care Home Management;Cryotherapy;Electrical Stimulation;Ultrasound;Moist Heat;Iontophoresis 4mg /ml Dexamethasone;Therapeutic activities;Functional mobility training;Therapeutic exercise;Manual techniques;Patient/family education;Vasopneumatic Device    PT Next Visit Plan Pulsed combo/E'stim, Ionto (once cert is signed), e'stim/Vasopnuematic.  PAIN-FREE RT LE THER EX.    Consulted and Agree with Plan of Care Patient           Patient will benefit from skilled therapeutic intervention in order to improve the following deficits and impairments:  Pain, Decreased activity tolerance, Decreased strength  Visit Diagnosis: Acute pain of right knee     Problem List Patient Active Problem List   Diagnosis Date Noted  . Abnormal finding on imaging of liver 02/01/2019  . Chronic kidney disease (CKD), stage III (moderate) 12/09/2018  . Malignant neoplasm of colon ([place]) 08/11/2015  . 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 Adenocarcinoma of colon ([place]) 01/05/2015  . Hypertension associated with diabetes ([place]) 12/17/2013  . Arthritis 12/17/2013  . Hypothyroidism 10/16/2011  . GERD (gastroesophageal reflux disease) 10/16/2011    Gabriela Eves, PT, DPT 12/24/2019, 5:59 PM  [facility] [address] [place], [place], [address] Phone: [phone]   Fax:  [phone]  Name: Cheryl Jordan MRN: [id number] Date of Birth: Jan 21, 1941

## 2019-12-28 ENCOUNTER — Other Ambulatory Visit: Payer: Self-pay

## 2019-12-28 ENCOUNTER — Ambulatory Visit: Payer: Medicare Other | Admitting: *Deleted

## 2019-12-28 DIAGNOSIS — M25561 Pain in right knee: Secondary | ICD-10-CM

## 2019-12-28 NOTE — Therapy (Signed)
[facility] [place], [place], [address] Phone: [phone]   Fax:  [phone]  Physical Therapy Treatment  Patient Details  Name: Cheryl Jordan MRN: [id number] Date of Birth: 1941/05/31 Referring Provider (PT): Paralee Cancel MD   Encounter Date: 12/28/2019   PT End of Session - 12/28/19 1035    Visit Number 3    Number of Visits 8    Date for PT Re-Evaluation 01/18/20    PT Start Time 1030    PT Stop Time 1121    PT Time Calculation (min) 51 min           Past Medical History:  Diagnosis Date   Allergy    Anemia    hx of   Arthritis    Colon cancer ([place]) dx'd 11/2014   Diabetes mellitus without complication ([place])    TYPE II   GERD (gastroesophageal reflux disease)    History of gout    Hypertension    Hypothyroidism    PONV (postoperative nausea and vomiting)     Past Surgical History:  Procedure Laterality Date   CHOLECYSTECTOMY     IR RADIOLOGIST EVAL & MGMT  01/24/2017   KNEE SURGERY     NEPHRECTOMY Left 02/10/2015   Procedure: OPEN RETROPERINTONEAL EXPLORATION LEFT RENAL  CYST DECORTICATION X 5;  Surgeon: Cleon Gustin, MD;  Location: [facility];  Service: Urology;  Laterality: Left;   PARTIAL COLECTOMY N/A 02/10/2015   Procedure: OPEN RIGHT  COLECTOMY ;  Surgeon: [name], MD;  Location: [facility];  Service: General;  Laterality: N/A;   TUBAL LIGATION      There were no vitals filed for this visit.   Subjective Assessment - 12/28/19 1034    Subjective COVID-19 screening performed upon arrival. Did great after last Rx    Pertinent History DM, HTN, hypothyroidism.    How long can you stand comfortably? Varies.    How long can you walk comfortably? Varies.    Patient Stated Goals Get out of pain.    Currently in Pain? Yes    Pain Score 1     Pain Location Knee    Pain Orientation Right    Pain Descriptors / Indicators Sore    Pain Type Acute pain    Pain Onset 1 to 4 weeks ago                              [facility] Adult PT Treatment/Exercise - 12/28/19 0001      Exercises   Exercises Knee/Hip      Knee/Hip Exercises: Aerobic   Nustep level 2 x10 mins      Modalities   Modalities Electrical Stimulation;Vasopneumatic;Ultrasound;Iontophoresis      Electrical Stimulation   Electrical Stimulation Location Right Pes Anserine.    Electrical Stimulation Action pre-mod    Electrical Stimulation Parameters 80-150hz  x 15 mins    Electrical Stimulation Goals Pain      Ultrasound   Ultrasound Location RT pes anserine    Ultrasound Parameters 50% 3.3 mhz, 1.5 w/cm2 x 10 mins    Ultrasound Goals Pain      Iontophoresis   Type of Iontophoresis Dexamethasone    Location R pes anserine    Dose 1 ml    Time 8 mins (1 of 6)      Vasopneumatic   Number Minutes Vasopneumatic  10 minutes    Vasopnuematic Location  Knee  Vasopneumatic Pressure Low    Vasopneumatic Temperature  34                       PT Long Term Goals - 12/24/19 1259      PT LONG TERM GOAL #1   Title Independent with a HEP.    Time 4    Period Weeks    Status New      PT LONG TERM GOAL #2   Title Perform ADL's with right knee pain-level not to exceed a 3/10.    Time 4    Period Weeks    Status New                 Plan - 12/28/19 1144    Clinical Impression Statement Pt arrived today reporting improvement from last Rx with decreased pain. She tolerated therex, [place], ionto as well as Vaso. Decreased point tenderness pes- anserine area. Pt to remove patch at home after 4 hrs    Personal Factors and Comorbidities Comorbidity 1;Comorbidity 2    Comorbidities DM, HTN, hypothyroidism.    Stability/Clinical Decision Making Stable/Uncomplicated    Rehab Potential Excellent    PT Frequency 2x / week    PT Treatment/Interventions ADLs/Self Care Home Management;Cryotherapy;Electrical Stimulation;Ultrasound;Moist Heat;Iontophoresis 4mg /ml Dexamethasone;Therapeutic  activities;Functional mobility training;Therapeutic exercise;Manual techniques;Patient/family education;Vasopneumatic Device    PT Next Visit Plan Pulsed combo/E'stim, Ionto (once cert is signed), e'stim/Vasopnuematic.  PAIN-FREE RT LE THER EX.           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

## 2019-12-29 ENCOUNTER — Telehealth: Payer: Self-pay | Admitting: Physician Assistant

## 2019-12-29 ENCOUNTER — Encounter: Payer: Self-pay | Admitting: Nurse Practitioner

## 2019-12-29 ENCOUNTER — Other Ambulatory Visit: Payer: Self-pay

## 2019-12-29 ENCOUNTER — Telehealth: Payer: Self-pay | Admitting: Nurse Practitioner

## 2019-12-29 ENCOUNTER — Inpatient Hospital Stay: Payer: Medicare Other | Attending: Nurse Practitioner | Admitting: Nurse Practitioner

## 2019-12-29 ENCOUNTER — Inpatient Hospital Stay: Payer: Medicare Other

## 2019-12-29 VITALS — BP 160/49 | HR 61 | Temp 98.1°F | Resp 16 | Wt 216.2 lb

## 2019-12-29 DIAGNOSIS — D509 Iron deficiency anemia, unspecified: Secondary | ICD-10-CM | POA: Diagnosis not present

## 2019-12-29 DIAGNOSIS — C180 Malignant neoplasm of cecum: Secondary | ICD-10-CM | POA: Diagnosis present

## 2019-12-29 DIAGNOSIS — R970 Elevated carcinoembryonic antigen [CEA]: Secondary | ICD-10-CM | POA: Insufficient documentation

## 2019-12-29 DIAGNOSIS — N2889 Other specified disorders of kidney and ureter: Secondary | ICD-10-CM | POA: Insufficient documentation

## 2019-12-29 DIAGNOSIS — Z79899 Other long term (current) drug therapy: Secondary | ICD-10-CM | POA: Diagnosis not present

## 2019-12-29 DIAGNOSIS — E119 Type 2 diabetes mellitus without complications: Secondary | ICD-10-CM | POA: Insufficient documentation

## 2019-12-29 DIAGNOSIS — C189 Malignant neoplasm of colon, unspecified: Secondary | ICD-10-CM

## 2019-12-29 DIAGNOSIS — K7469 Other cirrhosis of liver: Secondary | ICD-10-CM | POA: Diagnosis not present

## 2019-12-29 DIAGNOSIS — I10 Essential (primary) hypertension: Secondary | ICD-10-CM | POA: Insufficient documentation

## 2019-12-29 NOTE — Telephone Encounter (Signed)
Left message for patient to schedule Annual Wellness Visit.  Please schedule with Nurse Health Advisor Martha Stanley, RN at [facility]  

## 2019-12-29 NOTE — Progress Notes (Signed)
  [facility] OFFICE PROGRESS NOTE   Diagnosis: Colon cancer  INTERVAL HISTORY:   Cheryl Jordan returns as scheduled.  She feels well.  No change in bowel habits.  No bloody or black bowel movements.  She denies abdominal pain.  No nausea or vomiting.  She has a good appetite.  Objective:  Vital signs in last 24 hours:  Blood pressure (!) 160/49, pulse 61, temperature 98.1 F (36.7 C), temperature source Temporal, resp. rate 16, weight 216 lb 3.2 oz (98.1 kg), SpO2 97 %.    HEENT: Neck without mass. Lymphatics: No palpable cervical, supraclavicular, axillary or inguinal lymph nodes. Resp: Lungs clear bilaterally. Cardio: Regular rate and rhythm. GI: Abdomen soft and nontender.  No hepatosplenomegaly.  No mass. Vascular: No leg edema.  Lab Results:  Lab Results  Component Value Date   WBC 4.7 01/29/2019   HGB 12.9 01/29/2019   HCT 39.2 01/29/2019   MCV 91.9 01/29/2019   PLT 98.0 (L) 01/29/2019   NEUTROABS 2.7 01/29/2019    Imaging:  No results found.  Medications: I have reviewed the patient's current medications.  Assessment/Plan: 1. 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 ? Loss of MLH1 and PMS2 expression ? Microsatellite instability-high ? CT chest 03/03/2015-negative for metastatic disease ? Cycle 1 adjuvant Xeloda 03/18/2015 ? Cycle 2 adjuvant Xeloda 04/09/2015 ? Cycle 3 adjuvant Xeloda 04/30/2015 ? Cycle 4 adjuvant Xeloda 05/21/2015 ? Cycle 5 adjuvant Xeloda 06/11/2015 (dose reduced due to desquamation of feet). ? Cycle 6 adjuvant Xeloda 07/02/2015 ? Cycle 7 adjuvant Xeloda 07/23/2015 ? Cycle 8 adjuvant Xeloda 08/13/2015 ? CTs chest, abdomen, and pelvis 02/06/2016-cirrhosis,  heterogenous enhancement of the liver, small hypoenhancing lesions in the liver-? 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 Likely regenerative nodules in the liver;does not appear as HCCor metastases. Left renal mass very slow growing. CT scan recommended in one year) ? Colonoscopy 08/06/2016-multiple 5-15 mm very subtle sessile serrated adenomasfound in the transverse colon and at the anastomosis. Next colonoscopy at a one-year interval. ? CT 12/23/2017-no evidence of recurrent colon cancer, cirrhotic liver with no dominant mass, left renal mass-cystic component mildly larger compared to 2017  2. Anemia, microcytic. Likely iron deficiency from previous GI blood loss. Oral iron initiated 03/17/2015. Improved. 3. 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 She is followed by Dr. Alyson Ingles, urology.Unchanged on CT 02/06/2016, unchanged on MRI 12/06/2016, cystic component slightly larger on CT 12/23/2017 4. Diabetes 5. Hypertension 6. History of mild hand/foot syndrome secondary to Xeloda 7. Enlarging segment 8 liver lesion noted on MRI 12/06/2016-felt to potentially represent a metastasis or HCC  Ultrasound-guided biopsy of the lesion on 02/06/2017 revealed cirrhosis, no malignancy  CT 12/23/2017-chronic liver nodularity, no dominant mass, changes of cirrhosis 8. Cirrhosis 9. History of mild thrombocytopenia-likely secondary to cirrhosis    Disposition: Cheryl Jordan remains in clinical remission from colon cancer.  We will follow-up on the CEA from today.  CEA has been chronically elevated in the past.  She continues follow-up of the left renal mass with Dr. Alyson Ingles.  She will return for CEA and follow-up visit in 6 months.    Ned Card ANP/GNP-BC   12/29/2019  3:06 PM

## 2019-12-29 NOTE — Telephone Encounter (Signed)
Scheduled per 6/15 los. Printed avs and calendar for pt.

## 2019-12-30 LAB — CEA (IN HOUSE-CHCC): CEA (CHCC-In House): 5.11 ng/mL — ABNORMAL HIGH (ref 0.00–5.00)

## 2019-12-31 ENCOUNTER — Ambulatory Visit: Payer: Medicare Other | Admitting: Physical Therapy

## 2019-12-31 ENCOUNTER — Other Ambulatory Visit: Payer: Self-pay

## 2019-12-31 ENCOUNTER — Encounter: Payer: Self-pay | Admitting: Physical Therapy

## 2019-12-31 DIAGNOSIS — M25561 Pain in right knee: Secondary | ICD-10-CM

## 2019-12-31 NOTE — Therapy (Signed)
[facility] [place], [place], [address] Phone: [phone]   Fax:  [phone]  Physical Therapy Treatment  Patient Details  Name: Cheryl Jordan MRN: [id number] Date of Birth: Jan 03, 1941 Referring Provider (PT): Paralee Cancel MD   Encounter Date: 12/31/2019   PT End of Session - 12/31/19 1217    Visit Number 4    Number of Visits 8    Date for PT Re-Evaluation 01/18/20    PT Start Time 1115    PT Stop Time 1206    PT Time Calculation (min) 51 min    Activity Tolerance Patient tolerated treatment well    Behavior During Therapy [facility] for tasks assessed/performed           Past Medical History:  Diagnosis Date  . Allergy   . Anemia    hx of  . Arthritis   . Colon cancer ([place]) dx'd 11/2014  . Diabetes mellitus without complication ([place])    TYPE II  . GERD (gastroesophageal reflux disease)   . History of gout   . Hypertension   . Hypothyroidism   . PONV (postoperative nausea and vomiting)     Past Surgical History:  Procedure Laterality Date  . CHOLECYSTECTOMY    . IR RADIOLOGIST EVAL & MGMT  01/24/2017  . KNEE SURGERY    . NEPHRECTOMY Left 02/10/2015   Procedure: OPEN RETROPERINTONEAL EXPLORATION LEFT RENAL  CYST DECORTICATION X 5;  Surgeon: Cleon Gustin, MD;  Location: [facility];  Service: Urology;  Laterality: Left;  . PARTIAL COLECTOMY N/A 02/10/2015   Procedure: OPEN RIGHT  COLECTOMY ;  Surgeon: [name], MD;  Location: [facility];  Service: General;  Laterality: N/A;  . TUBAL LIGATION      There were no vitals filed for this visit.   Subjective Assessment - 12/31/19 1216    Subjective COVID-19 screening performed upon arrival. Reports doing well, had some discomfort in her knee last night.    Pertinent History DM, HTN, hypothyroidism.    How long can you stand comfortably? Varies.    How long can you walk comfortably? Varies.    Patient Stated Goals Get out of pain.    Currently in Pain? No/denies               [facility] PT Assessment - 12/31/19 0001      Assessment   Medical Diagnosis Right knee pain.    Referring Provider (PT) Paralee Cancel MD      Precautions   Precaution Comments PAIN-FREE THER EX.                         [place] Adult PT Treatment/Exercise - 12/31/19 0001      Exercises   Exercises Knee/Hip      Knee/Hip Exercises: Aerobic   Nustep level 2 x10 mins      Knee/Hip Exercises: Supine   Other Supine Knee/Hip Exercises pillow squeeze 5" hold x10; supine clamshells yellow theraband x10      Modalities   Modalities Electrical Stimulation;Vasopneumatic;Ultrasound;Iontophoresis      Electrical Stimulation   Electrical Stimulation Location Right Pes Anserine.    Electrical Stimulation Action pre-mod    Electrical Stimulation Parameters 80-150 hz x10 mins    Electrical Stimulation Goals Pain;Edema      Ultrasound   Ultrasound Location Right Pes anserine    Ultrasound Parameters 50% 3.3 mhz x10 mins    Ultrasound Goals Pain  Iontophoresis   Type of Iontophoresis Dexamethasone    Location R pes anserine    Dose 1 ml     Time 8 mins 3 of 6      Vasopneumatic   Number Minutes Vasopneumatic  10 minutes    Vasopnuematic Location  Knee    Vasopneumatic Pressure Low    Vasopneumatic Temperature  34                       PT Long Term Goals - 12/24/19 1259      PT LONG TERM GOAL #1   Title Independent with a HEP.    Time 4    Period Weeks    Status New      PT LONG TERM GOAL #2   Title Perform ADL's with right knee pain-level not to exceed a 3/10.    Time 4    Period Weeks    Status New                 Plan - 12/31/19 1217    Clinical Impression Statement Patient responded well to therapy session. Supine LE strengthening exercises initiated with a good response and no reports of increased pain. No adverse affects noted upon completion of modalities. Patient able to recite removal time of ionto patch. Patient  provided with HEP to improve strengthening.    Personal Factors and Comorbidities Comorbidity 1;Comorbidity 2    Comorbidities DM, HTN, hypothyroidism.    Examination-Activity Limitations Stand;Other    Examination-Participation Restrictions Other    Stability/Clinical Decision Making Stable/Uncomplicated    Clinical Decision Making Low    Rehab Potential Excellent    PT Frequency 2x / week    PT Duration 4 weeks    PT Treatment/Interventions ADLs/Self Care Home Management;Cryotherapy;Electrical Stimulation;Ultrasound;Moist Heat;Iontophoresis 4mg /ml Dexamethasone;Therapeutic activities;Functional mobility training;Therapeutic exercise;Manual techniques;Patient/family education;Vasopneumatic Device    PT Next Visit Plan Pulsed combo/E'stim, Ionto (once cert is signed), e'stim/Vasopnuematic.  PAIN-FREE RT LE THER EX.    PT Home Exercise Plan supine clam shells, pillow squeeze    Consulted and Agree with Plan of Care Patient           Patient will benefit from skilled therapeutic intervention in order to improve the following deficits and impairments:  Pain, Decreased activity tolerance, Decreased strength  Visit Diagnosis: Acute pain of right knee     Problem List Patient Active Problem List   Diagnosis Date Noted  . Abnormal finding on imaging of liver 02/01/2019  . Chronic kidney disease (CKD), stage III (moderate) 12/09/2018  . Malignant neoplasm of colon ([place]) 08/11/2015  . 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 Adenocarcinoma of colon ([place]) 01/05/2015  . Hypertension associated with diabetes ([place]) 12/17/2013  . Arthritis 12/17/2013  . Hypothyroidism 10/16/2011  . GERD (gastroesophageal reflux disease) 10/16/2011    Gabriela Eves, PT, DPT 12/31/2019, 12:21 PM  [facility] [address] [place], [place], [address] Phone: [phone]   Fax:  [phone]  Name: Cheryl Jordan MRN: [id number] Date of Birth:  1940/11/02

## 2020-01-04 ENCOUNTER — Encounter: Payer: Self-pay | Admitting: Physical Therapy

## 2020-01-04 ENCOUNTER — Other Ambulatory Visit: Payer: Self-pay

## 2020-01-04 ENCOUNTER — Ambulatory Visit: Payer: Medicare Other | Admitting: Physical Therapy

## 2020-01-04 DIAGNOSIS — M25561 Pain in right knee: Secondary | ICD-10-CM | POA: Diagnosis not present

## 2020-01-04 NOTE — Therapy (Signed)
[facility] [place], [place], [address] Phone: [phone]   Fax:  [phone]  Physical Therapy Treatment  Patient Details  Name: Cheryl Jordan MRN: [id number] Date of Birth: 11/06/1940 Referring Provider (PT): Paralee Cancel MD   Encounter Date: 01/04/2020   PT End of Session - 01/04/20 1320    Visit Number 5    Number of Visits 8    Date for PT Re-Evaluation 01/18/20    PT Start Time 1300    PT Stop Time 1349    PT Time Calculation (min) 49 min    Activity Tolerance Patient tolerated treatment well    Behavior During Therapy [facility] for tasks assessed/performed           Past Medical History:  Diagnosis Date  . Allergy   . Anemia    hx of  . Arthritis   . Colon cancer ([place]) dx'd 11/2014  . Diabetes mellitus without complication ([place])    TYPE II  . GERD (gastroesophageal reflux disease)   . History of gout   . Hypertension   . Hypothyroidism   . PONV (postoperative nausea and vomiting)     Past Surgical History:  Procedure Laterality Date  . CHOLECYSTECTOMY    . IR RADIOLOGIST EVAL & MGMT  01/24/2017  . KNEE SURGERY    . NEPHRECTOMY Left 02/10/2015   Procedure: OPEN RETROPERINTONEAL EXPLORATION LEFT RENAL  CYST DECORTICATION X 5;  Surgeon: Cleon Gustin, MD;  [facility];  Service: Urology;  Laterality: Left;  . PARTIAL COLECTOMY N/A 02/10/2015   Procedure: OPEN RIGHT  COLECTOMY ;  Surgeon: [name], MD;  [facility];  Service: General;  Laterality: N/A;  . TUBAL LIGATION      There were no vitals filed for this visit.   Subjective Assessment - 01/04/20 1404    Subjective COVID-19 screening performed upon arrival. Reports no new complaints. Less pain today.    Pertinent History DM, HTN, hypothyroidism.    How long can you stand comfortably? Varies.    How long can you walk comfortably? Varies.              [facility] PT Assessment - 01/04/20 0001      Assessment   Medical Diagnosis Right  knee pain.    Referring Provider (PT) Paralee Cancel MD      Precautions   Precaution Comments PAIN-FREE THER EX.                         [facility] Adult PT Treatment/Exercise - 01/04/20 0001      Exercises   Exercises Knee/Hip      Knee/Hip Exercises: Aerobic   Nustep level 4 x12 mins      Knee/Hip Exercises: Supine   Bridges with Ball Squeeze AROM;Both;1 set;15 reps    Straight Leg Raises AROM;Right;1 set;15 reps    Other Supine Knee/Hip Exercises clam shell x15 red theraband 2" hold      Modalities   Modalities Electrical Stimulation;Vasopneumatic;Ultrasound;Iontophoresis      Electrical Stimulation   Electrical Stimulation Location Right Pes Anserine.    Electrical Stimulation Action pre-mod    Electrical Stimulation Parameters 80-150 hz x10 mins    Electrical Stimulation Goals Pain;Edema      Ultrasound   Ultrasound Location Right Pes Anserine    Ultrasound Parameters 50% 3.3 mhz x10 mins    Ultrasound Goals Pain      Vasopneumatic   Number  Minutes Vasopneumatic  10 minutes    Vasopnuematic Location  Knee    Vasopneumatic Pressure Low    Vasopneumatic Temperature  34                       PT Long Term Goals - 01/04/20 1320      PT LONG TERM GOAL #1   Title Independent with a HEP.    Time 4    Period Weeks    Status On-going      PT LONG TERM GOAL #2   Title Perform ADL's with right knee pain-level not to exceed a 3/10.    Time 4    Period Weeks    Status On-going                 Plan - 01/04/20 1401    Clinical Impression Statement Patient responded well to therapy session with added exercises. Patient able to complete exercises with good form and technique with only slight soreness. Ultrasound performed with no adverse affects. Still noted with slight edema in pes anserine region therefore e-stim and vasopneumatic device performed with no adverse affects upon removal.    Personal Factors and Comorbidities Comorbidity  1;Comorbidity 2    Comorbidities DM, HTN, hypothyroidism.    Examination-Activity Limitations Stand;Other    Examination-Participation Restrictions Other    Stability/Clinical Decision Making Stable/Uncomplicated    Clinical Decision Making Low    Rehab Potential Excellent    PT Frequency 2x / week    PT Duration 4 weeks    PT Treatment/Interventions ADLs/Self Care Home Management;Cryotherapy;Electrical Stimulation;Ultrasound;Moist Heat;Iontophoresis 4mg /ml Dexamethasone;Therapeutic activities;Functional mobility training;Therapeutic exercise;Manual techniques;Patient/family education;Vasopneumatic Device    PT Next Visit Plan Pulsed combo/E'stim, Ionto (once cert is signed), e'stim/Vasopnuematic.  PAIN-FREE RT LE THER EX.    PT Home Exercise Plan supine clam shells, pillow squeeze    Consulted and Agree with Plan of Care Patient           Patient will benefit from skilled therapeutic intervention in order to improve the following deficits and impairments:  Pain, Decreased activity tolerance, Decreased strength  Visit Diagnosis: Acute pain of right knee     Problem List Patient Active Problem List   Diagnosis Date Noted  . Abnormal finding on imaging of liver 02/01/2019  . Chronic kidney disease (CKD), stage III (moderate) 12/09/2018  . Malignant neoplasm of colon ([place]) 08/11/2015  . 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 Adenocarcinoma of colon ([place]) 01/05/2015  . Hypertension associated with diabetes ([place]) 12/17/2013  . Arthritis 12/17/2013  . Hypothyroidism 10/16/2011  . GERD (gastroesophageal reflux disease) 10/16/2011    Gabriela Eves, PT, DPT 01/04/2020, 2:40 PM  [facility] [address] [place], [place], [address] Phone: [phone]   Fax:  [phone]  Name: Cheryl Jordan MRN: [id number] Date of Birth: March 28, 1941

## 2020-01-07 ENCOUNTER — Ambulatory Visit: Payer: Medicare Other | Admitting: Physical Therapy

## 2020-01-07 ENCOUNTER — Other Ambulatory Visit: Payer: Self-pay

## 2020-01-07 DIAGNOSIS — M25561 Pain in right knee: Secondary | ICD-10-CM | POA: Diagnosis not present

## 2020-01-07 NOTE — Therapy (Signed)
[facility] [place], [place], [address] Phone: [phone]   Fax:  [phone]  Physical Therapy Treatment  Patient Details  Name: Cheryl Jordan MRN: [id number] Date of Birth: Aug 24, 1940 Referring Provider (PT): Paralee Cancel MD   Encounter Date: 01/07/2020   PT End of Session - 01/07/20 1002    Visit Number 6    Number of Visits 8    Date for PT Re-Evaluation 01/18/20    PT Start Time 0945    PT Stop Time 1033    PT Time Calculation (min) 48 min    Activity Tolerance Patient tolerated treatment well    Behavior During Therapy [facility] for tasks assessed/performed           Past Medical History:  Diagnosis Date  . Allergy   . Anemia    hx of  . Arthritis   . Colon cancer ([place]) dx'd 11/2014  . Diabetes mellitus without complication ([place])    TYPE II  . GERD (gastroesophageal reflux disease)   . History of gout   . Hypertension   . Hypothyroidism   . PONV (postoperative nausea and vomiting)     Past Surgical History:  Procedure Laterality Date  . CHOLECYSTECTOMY    . IR RADIOLOGIST EVAL & MGMT  01/24/2017  . KNEE SURGERY    . NEPHRECTOMY Left 02/10/2015   Procedure: OPEN RETROPERINTONEAL EXPLORATION LEFT RENAL  CYST DECORTICATION X 5;  Surgeon: Cleon Gustin, MD;  [facility];  Service: Urology;  Laterality: Left;  . PARTIAL COLECTOMY N/A 02/10/2015   Procedure: OPEN RIGHT  COLECTOMY ;  Surgeon: [name], MD;  [facility];  Service: General;  Laterality: N/A;  . TUBAL LIGATION      There were no vitals filed for this visit.   Subjective Assessment - 01/07/20 1002    Subjective COVID-19 screening performed upon arrival. Reports 3-4/10 soreness.    Pertinent History DM, HTN, hypothyroidism.    How long can you stand comfortably? Varies.    How long can you walk comfortably? Varies.    Patient Stated Goals Get out of pain.    Currently in Pain? No/denies              [facility] PT Assessment -  01/07/20 0001      Assessment   Medical Diagnosis Right knee pain.    Referring Provider (PT) Paralee Cancel MD      Precautions   Precaution Comments PAIN-FREE THER EX.                         [facility] Adult PT Treatment/Exercise - 01/07/20 0001      Exercises   Exercises Knee/Hip      Knee/Hip Exercises: Aerobic   Nustep level 4 x12 mins      Knee/Hip Exercises: Supine   Short Arc Quad Sets Strengthening;Right;2 sets;10 reps    Short Arc Quad Sets Limitations 2#    Straight Leg Raises AROM;Right;1 set;2 sets;10 reps    Other Supine Knee/Hip Exercises clam shell x15 red theraband 2" hold      Modalities   Modalities Electrical Stimulation;Vasopneumatic;Ultrasound;Iontophoresis      Electrical Stimulation   Electrical Stimulation Location Right Pes Anserine.    Electrical Stimulation Action pre-mod    Electrical Stimulation Parameters 80-150 hz x10    Electrical Stimulation Goals Pain;Edema      Ultrasound   Ultrasound Location Right Pes Anserine  Ultrasound Parameters 50%, 3.3 mhz, 1.5 w/cm2 x8 mins    Ultrasound Goals Pain      Iontophoresis   Type of Iontophoresis Dexamethasone    Location R pes anserine    Dose 13mL    Time 8 mins 4 of 6      Vasopneumatic   Number Minutes Vasopneumatic  10 minutes    Vasopnuematic Location  Knee    Vasopneumatic Pressure Low    Vasopneumatic Temperature  34                       PT Long Term Goals - 01/04/20 1320      PT LONG TERM GOAL #1   Title Independent with a HEP.    Time 4    Period Weeks    Status On-going      PT LONG TERM GOAL #2   Title Perform ADL's with right knee pain-level not to exceed a 3/10.    Time 4    Period Weeks    Status On-going                 Plan - 01/07/20 1040    Clinical Impression Statement Patient responded well to the progression of reps with no complaints. Patient reported slight increase of soreness with SLR but able to complete all reps.  Iontophoresis patch donned and patient able to recall carryover of removal time. No adverse affects upon removal of modalities.    Personal Factors and Comorbidities Comorbidity 1;Comorbidity 2    Comorbidities DM, HTN, hypothyroidism.    Examination-Activity Limitations Stand;Other    Examination-Participation Restrictions Other    Stability/Clinical Decision Making Stable/Uncomplicated    Clinical Decision Making Low    Rehab Potential Excellent    PT Frequency 2x / week    PT Duration 4 weeks    PT Treatment/Interventions ADLs/Self Care Home Management;Cryotherapy;Electrical Stimulation;Ultrasound;Moist Heat;Iontophoresis 4mg /ml Dexamethasone;Therapeutic activities;Functional mobility training;Therapeutic exercise;Manual techniques;Patient/family education;Vasopneumatic Device    PT Next Visit Plan Pulsed combo/E'stim, Ionto (once cert is signed), e'stim/Vasopnuematic.  PAIN-FREE RT LE THER EX.    PT Home Exercise Plan supine clam shells, pillow squeeze    Consulted and Agree with Plan of Care Patient           Patient will benefit from skilled therapeutic intervention in order to improve the following deficits and impairments:  Pain, Decreased activity tolerance, Decreased strength  Visit Diagnosis: Acute pain of right knee     Problem List Patient Active Problem List   Diagnosis Date Noted  . Abnormal finding on imaging of liver 02/01/2019  . Chronic kidney disease (CKD), stage III (moderate) 12/09/2018  . Malignant neoplasm of colon ([place]) 08/11/2015  . 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 Adenocarcinoma of colon ([place]) 01/05/2015  . Hypertension associated with diabetes ([place]) 12/17/2013  . Arthritis 12/17/2013  . Hypothyroidism 10/16/2011  . GERD (gastroesophageal reflux disease) 10/16/2011    Cheryl Jordan, PT, DPT 01/07/2020, 11:24 AM  [facility] [address] [place], [place], [address] Phone:  [phone]   Fax:  [phone]  Name: Cheryl Jordan MRN: [id number] Date of Birth: 1940/08/14

## 2020-01-11 ENCOUNTER — Other Ambulatory Visit: Payer: Self-pay

## 2020-01-11 ENCOUNTER — Ambulatory Visit: Payer: Medicare Other | Admitting: Physical Therapy

## 2020-01-11 ENCOUNTER — Encounter: Payer: Self-pay | Admitting: Physical Therapy

## 2020-01-11 DIAGNOSIS — M25561 Pain in right knee: Secondary | ICD-10-CM

## 2020-01-11 NOTE — Therapy (Signed)
[facility] [place], [place], [address] Phone: [phone]   Fax:  [phone]  Physical Therapy Treatment  Patient Details  Name: Cheryl Jordan MRN: [id number] Date of Birth: [birth year] Referring Provider (PT): Paralee Cancel MD   Encounter Date: 01/11/2020   PT End of Session - 01/11/20 1145    Visit Number 7    Number of Visits 8    Date for PT Re-Evaluation 01/18/20    PT Start Time 1116    PT Stop Time 1201    PT Time Calculation (min) 45 min    Activity Tolerance Patient tolerated treatment well    Behavior During Therapy [facility] for tasks assessed/performed           Past Medical History:  Diagnosis Date  . Allergy   . Anemia    hx of  . Arthritis   . Colon cancer ([place]) dx'd 11/2014  . Diabetes mellitus without complication ([place])    TYPE II  . GERD (gastroesophageal reflux disease)   . History of gout   . Hypertension   . Hypothyroidism   . PONV (postoperative nausea and vomiting)     Past Surgical History:  Procedure Laterality Date  . CHOLECYSTECTOMY    . IR RADIOLOGIST EVAL & MGMT  01/24/2017  . KNEE SURGERY    . NEPHRECTOMY Left 02/10/2015   Procedure: OPEN RETROPERINTONEAL EXPLORATION LEFT RENAL  CYST DECORTICATION X 5;  Surgeon: Cleon Gustin, MD;  [facility];  Service: Urology;  Laterality: Left;  . PARTIAL COLECTOMY N/A 02/10/2015   Procedure: OPEN RIGHT  COLECTOMY ;  Surgeon: [name], MD;  [facility];  Service: General;  Laterality: N/A;  . TUBAL LIGATION      There were no vitals filed for this visit.   Subjective Assessment - 01/11/20 1116    Subjective COVID-19 screening performed upon arrival. No pain upon arrival but does have more swelling as the day progresses.    Pertinent History DM, HTN, hypothyroidism.    How long can you stand comfortably? Varies.    How long can you walk comfortably? Varies.    Patient Stated Goals Get out of pain.    Currently in Pain?  No/denies              [facility] PT Assessment - 01/11/20 0001      Assessment   Medical Diagnosis Right knee pain.    Referring Provider (PT) Paralee Cancel MD      Precautions   Precaution Comments PAIN-FREE THER EX.                         [place] Adult PT Treatment/Exercise - 01/11/20 0001      Knee/Hip Exercises: Aerobic   Nustep level 4 x12 mins      Knee/Hip Exercises: Supine   Short Arc Quad Sets Strengthening;Right;2 sets;10 reps    Short Arc Quad Sets Limitations 2#    Hip Adduction Isometric Strengthening;20 reps    Straight Leg Raises AROM;Right;10 reps    Straight Leg Raises Limitations limited by R hip discomfort    Other Supine Knee/Hip Exercises clam shell x20 red theraband 2" hold      Modalities   Modalities Vasopneumatic;Iontophoresis      Iontophoresis   Type of Iontophoresis Dexamethasone    Location R pes anserine    Dose 9mL    Time 8 mins 5 of 6  Vasopneumatic   Number Minutes Vasopneumatic  10 minutes    Vasopnuematic Location  Knee    Vasopneumatic Pressure Low    Vasopneumatic Temperature  34                       PT Long Term Goals - 01/04/20 1320      PT LONG TERM GOAL #1   Title Independent with a HEP.    Time 4    Period Weeks    Status On-going      PT LONG TERM GOAL #2   Title Perform ADL's with right knee pain-level not to exceed a 3/10.    Time 4    Period Weeks    Status On-going                 Plan - 01/11/20 1214    Clinical Impression Statement Patient tolerated today's treatment well as she arrived with no pain. Patient reporting swelling of R knee to ankle as the day progresses. No pain reported by patient during light therex strengthening. Ionto patch donned to R medial knee as indicated by patient and advised to remove in 4 hours. Normal vasopnuematic response noted following removal of the modality.    Personal Factors and Comorbidities Comorbidity 1;Comorbidity 2     Comorbidities DM, HTN, hypothyroidism.    Examination-Activity Limitations Stand;Other    Examination-Participation Restrictions Other    Stability/Clinical Decision Making Stable/Uncomplicated    Rehab Potential Excellent    PT Frequency 2x / week    PT Duration 4 weeks    PT Treatment/Interventions ADLs/Self Care Home Management;Cryotherapy;Electrical Stimulation;Ultrasound;Moist Heat;Iontophoresis 4mg /ml Dexamethasone;Therapeutic activities;Functional mobility training;Therapeutic exercise;Manual techniques;Patient/family education;Vasopneumatic Device    PT Next Visit Plan Pulsed combo/E'stim, Ionto (once cert is signed), e'stim/Vasopnuematic.  PAIN-FREE RT LE THER EX.    PT Home Exercise Plan supine clam shells, pillow squeeze    Consulted and Agree with Plan of Care Patient           Patient will benefit from skilled therapeutic intervention in order to improve the following deficits and impairments:  Pain, Decreased activity tolerance, Decreased strength  Visit Diagnosis: Acute pain of right knee     Problem List Patient Active Problem List   Diagnosis Date Noted  . Abnormal finding on imaging of liver 02/01/2019  . Chronic kidney disease (CKD), stage III (moderate) 12/09/2018  . Malignant neoplasm of colon ([place]) 08/11/2015  . 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 Adenocarcinoma of colon ([place]) 01/05/2015  . Hypertension associated with diabetes ([place]) 12/17/2013  . Arthritis 12/17/2013  . Hypothyroidism 10/16/2011  . GERD (gastroesophageal reflux disease) 10/16/2011    Standley Brooking, PTA 01/11/2020, 12:19 PM  [facility] [address] [place], [place], [address] Phone: [phone]   Fax:  [phone]  Name: Cheryl Jordan MRN: [id number] Date of Birth: [birth year]

## 2020-01-14 ENCOUNTER — Encounter: Payer: Self-pay | Admitting: Physical Therapy

## 2020-01-14 ENCOUNTER — Other Ambulatory Visit: Payer: Self-pay

## 2020-01-14 ENCOUNTER — Ambulatory Visit: Payer: Medicare Other | Attending: Orthopedic Surgery | Admitting: Physical Therapy

## 2020-01-14 DIAGNOSIS — M25561 Pain in right knee: Secondary | ICD-10-CM | POA: Diagnosis not present

## 2020-01-14 NOTE — Therapy (Signed)
[facility] [place], [place], [address] Phone: [phone]   Fax:  [phone]  Physical Therapy Treatment  Patient Details  Name: Cheryl Jordan MRN: [id number] Date of Birth: 1940/09/18 Referring Provider (PT): Paralee Cancel MD   Encounter Date: 01/14/2020   PT End of Session - 01/14/20 1124    Visit Number 8    Number of Visits 8    Date for PT Re-Evaluation 01/18/20    PT Start Time 1118    PT Stop Time 1158    PT Time Calculation (min) 40 min    Activity Tolerance Patient tolerated treatment well    Behavior During Therapy [facility] for tasks assessed/performed           Past Medical History:  Diagnosis Date  . Allergy   . Anemia    hx of  . Arthritis   . Colon cancer ([place]) dx'd 11/2014  . Diabetes mellitus without complication ([place])    TYPE II  . GERD (gastroesophageal reflux disease)   . History of gout   . Hypertension   . Hypothyroidism   . PONV (postoperative nausea and vomiting)     Past Surgical History:  Procedure Laterality Date  . CHOLECYSTECTOMY    . IR RADIOLOGIST EVAL & MGMT  01/24/2017  . KNEE SURGERY    . NEPHRECTOMY Left 02/10/2015   Procedure: OPEN RETROPERINTONEAL EXPLORATION LEFT RENAL  CYST DECORTICATION X 5;  Surgeon: Cleon Gustin, MD;  Location: [facility];  Service: Urology;  Laterality: Left;  . PARTIAL COLECTOMY N/A 02/10/2015   Procedure: OPEN RIGHT  COLECTOMY ;  Surgeon: [name], MD;  Location: [facility];  Service: General;  Laterality: N/A;  . TUBAL LIGATION      There were no vitals filed for this visit.   Subjective Assessment - 01/14/20 1120    Subjective COVID-19 screening performed upon arrival. Patient reports that she has B knee soreness but reports that may be due to weather front coming in.    Pertinent History DM, HTN, hypothyroidism.    How long can you stand comfortably? Varies.    How long can you walk comfortably? Varies.    Patient Stated Goals Get out of pain.      Currently in Pain? Yes    Pain Score 3     Pain Location Knee    Pain Orientation Right;Left    Pain Descriptors / Indicators Sore    Pain Type Chronic pain    Pain Onset Today    Pain Frequency Intermittent              [facility] PT Assessment - 01/14/20 0001      Assessment   Medical Diagnosis Right knee pain.    Referring Provider (PT) Paralee Cancel MD    Next MD Visit PRN      Precautions   Precaution Comments PAIN-FREE THER EX.                         [facility] Adult PT Treatment/Exercise - 01/14/20 0001      Knee/Hip Exercises: Aerobic   Nustep L4 x12 min      Knee/Hip Exercises: Standing   Hip Abduction AROM;Both;15 reps;Knee straight      Knee/Hip Exercises: Seated   Long Arc Quad Strengthening;Right;20 reps;Weights    Long Arc Quad Weight 4 lbs.    Clamshell with Marga Hoots   x20 reps   Hamstring Curl Strengthening;Right;20 reps  Hamstring Limitations green theraband    Sit to Sand 10 reps;without UE support      Knee/Hip Exercises: Supine   Straight Leg Raises AROM;Right;2 sets;10 reps                       PT Long Term Goals - 01/14/20 1137      PT LONG TERM GOAL #1   Title Independent with a HEP.    Time 4    Period Weeks    Status Achieved      PT LONG TERM GOAL #2   Title Perform ADL's with right knee pain-level not to exceed a 3/10.    Time 4    Period Weeks    Status Achieved                 Plan - 01/14/20 1200    Clinical Impression Statement Patient presented in clinic with B knee soreness but correlates it to weather. Patient progressed through LE strengthening with only muscle fatigue reported by patient. Patient able to achieve all goals set at evaluation. Patient encouraged to continue HEP as directed to avoid any relapse of pain and limitations. Normal vasopnuematic response noted following removal of the modality.    Personal Factors and Comorbidities Comorbidity 1;Comorbidity 2     Comorbidities DM, HTN, hypothyroidism.    Examination-Activity Limitations Stand;Other    Examination-Participation Restrictions Other    Stability/Clinical Decision Making Stable/Uncomplicated    Rehab Potential Excellent    PT Frequency 2x / week    PT Duration 4 weeks    PT Treatment/Interventions ADLs/Self Care Home Management;Cryotherapy;Electrical Stimulation;Ultrasound;Moist Heat;Iontophoresis 78m/ml Dexamethasone;Therapeutic activities;Functional mobility training;Therapeutic exercise;Manual techniques;Patient/family education;Vasopneumatic Device    PT Next Visit Plan Pulsed combo/E'stim, Ionto (once cert is signed), e'stim/Vasopnuematic.  PAIN-FREE RT LE THER EX.    PT Home Exercise Plan supine clam shells, pillow squeeze    Consulted and Agree with Plan of Care Patient           Patient will benefit from skilled therapeutic intervention in order to improve the following deficits and impairments:  Pain, Decreased activity tolerance, Decreased strength  Visit Diagnosis: Acute pain of right knee     Problem List Patient Active Problem List   Diagnosis Date Noted  . Abnormal finding on imaging of liver 02/01/2019  . Chronic kidney disease (CKD), stage III (moderate) 12/09/2018  . Malignant [place] of colon ([place] 08/11/2015  . 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[place] 08/11/2015  . Adenocarcinoma of colon ([facility] 01/05/2015  . Hypertension associated with diabetes ([place] 12/17/2013  . Arthritis 12/17/2013  . Hypothyroidism 10/16/2011  . GERD (gastroesophageal reflux disease) 10/16/2011    KStandley Brooking7/07/2019, 12:14 PM  [facility] [address] [address] [place] [address]Phone: [phone]  Fax:  [phone] Name: Cheryl DEASMRN: [id number] of Birth: 701/23/1942 PHYSICAL THERAPY DISCHARGE SUMMARY  Visits from Start of Care: 10.  Current functional level related to goals / functional outcomes: See  above.   Remaining deficits: Goals met. Patient agrees to discharge.  Patient goals were met. Patient is being discharged due to meeting the stated rehab goals.  ?????         CMaliApplegate MPT

## 2020-01-27 ENCOUNTER — Other Ambulatory Visit: Payer: Self-pay

## 2020-01-27 ENCOUNTER — Ambulatory Visit (INDEPENDENT_AMBULATORY_CARE_PROVIDER_SITE_OTHER): Payer: Medicare Other | Admitting: Physician Assistant

## 2020-01-27 ENCOUNTER — Encounter: Payer: Self-pay | Admitting: Physician Assistant

## 2020-01-27 VITALS — BP 138/80 | HR 68 | Temp 98.0°F | Resp 16 | Ht 68.0 in | Wt 218.0 lb

## 2020-01-27 DIAGNOSIS — E785 Hyperlipidemia, unspecified: Secondary | ICD-10-CM

## 2020-01-27 DIAGNOSIS — R0602 Shortness of breath: Secondary | ICD-10-CM | POA: Diagnosis not present

## 2020-01-27 DIAGNOSIS — R42 Dizziness and giddiness: Secondary | ICD-10-CM | POA: Diagnosis not present

## 2020-01-27 DIAGNOSIS — E1169 Type 2 diabetes mellitus with other specified complication: Secondary | ICD-10-CM | POA: Diagnosis not present

## 2020-01-27 LAB — HEMOGLOBIN A1C: Hgb A1c MFr Bld: 6.5 % (ref 4.6–6.5)

## 2020-01-27 LAB — CBC WITH DIFFERENTIAL/PLATELET
Basophils Absolute: 0 10*3/uL (ref 0.0–0.1)
Basophils Relative: 0.6 % (ref 0.0–3.0)
Eosinophils Absolute: 0 10*3/uL (ref 0.0–0.7)
Eosinophils Relative: 1 % (ref 0.0–5.0)
HCT: 29.7 % — ABNORMAL LOW (ref 36.0–46.0)
Hemoglobin: 9.7 g/dL — ABNORMAL LOW (ref 12.0–15.0)
Lymphocytes Relative: 29.1 % (ref 12.0–46.0)
Lymphs Abs: 1.4 10*3/uL (ref 0.7–4.0)
MCHC: 32.8 g/dL (ref 30.0–36.0)
MCV: 85.6 fl (ref 78.0–100.0)
Monocytes Absolute: 0.3 10*3/uL (ref 0.1–1.0)
Monocytes Relative: 5.2 % (ref 3.0–12.0)
Neutro Abs: 3.2 10*3/uL (ref 1.4–7.7)
Neutrophils Relative %: 64.1 % (ref 43.0–77.0)
Platelets: 111 10*3/uL — ABNORMAL LOW (ref 150.0–400.0)
RBC: 3.47 Mil/uL — ABNORMAL LOW (ref 3.87–5.11)
RDW: 16.5 % — ABNORMAL HIGH (ref 11.5–15.5)
WBC: 5 10*3/uL (ref 4.0–10.5)

## 2020-01-27 LAB — LIPID PANEL
Cholesterol: 116 mg/dL (ref 0–200)
HDL: 43.8 mg/dL (ref >39.00)
LDL Cholesterol: 53 mg/dL (ref 0–99)
NonHDL: 71.84
Total CHOL/HDL Ratio: 3
Triglycerides: 92 mg/dL (ref 0.0–149.0)
VLDL: 18.4 mg/dL (ref 0.0–40.0)

## 2020-01-27 LAB — COMPREHENSIVE METABOLIC PANEL
ALT: 18 U/L (ref 0–35)
AST: 24 U/L (ref 0–37)
Albumin: 3.5 g/dL (ref 3.5–5.2)
Alkaline Phosphatase: 104 U/L (ref 39–117)
BUN: 17 mg/dL (ref 6–23)
CO2: 27 mEq/L (ref 19–32)
Calcium: 8.7 mg/dL (ref 8.4–10.5)
Chloride: 107 mEq/L (ref 96–112)
Creatinine, Ser: 1 mg/dL (ref 0.40–1.20)
GFR: 53.62 mL/min — ABNORMAL LOW (ref >60.00)
Glucose, Bld: 172 mg/dL — ABNORMAL HIGH (ref 70–99)
Potassium: 4.3 mEq/L (ref 3.5–5.1)
Sodium: 141 mEq/L (ref 135–145)
Total Bilirubin: 0.7 mg/dL (ref 0.2–1.2)
Total Protein: 5.9 g/dL — ABNORMAL LOW (ref 6.0–8.3)

## 2020-01-27 LAB — TSH: TSH: 0.88 u[IU]/mL (ref 0.35–4.50)

## 2020-01-27 NOTE — Patient Instructions (Signed)
Please go to the lab today for blood work.  I will call you with your results. We will alter treatment regimen(s) if indicated by your results.   I want you to continue staying well-hydrated.  You need to work on adding more protein to your diet. A lot of what you are eating are carbohydrates and is causing your sugar to spike but then crash without protein to help keep more stable. Use the meal planning guide I have given to work on these changes.  If you have a recurrence of symptoms I need you to check your glucose then, not an hour after eating. We want to know what the glucose is at that moment to see if the issue is hypoglycemia, just fluctuation in glucose or something else.   Giving your windedness with exertion, I am going to have Cardiology assess to see if they want to do an echo or stress testing.   Ok to continue the Lorazepam as directed. If labs look stable, again I would recommend we start a daily medication for anxiety.

## 2020-01-28 ENCOUNTER — Other Ambulatory Visit (INDEPENDENT_AMBULATORY_CARE_PROVIDER_SITE_OTHER): Payer: Medicare Other

## 2020-01-28 ENCOUNTER — Other Ambulatory Visit: Payer: Self-pay | Admitting: Emergency Medicine

## 2020-01-28 DIAGNOSIS — D649 Anemia, unspecified: Secondary | ICD-10-CM

## 2020-01-28 DIAGNOSIS — R710 Precipitous drop in hematocrit: Secondary | ICD-10-CM

## 2020-01-28 LAB — IBC PANEL
Iron: 46 ug/dL (ref 42–145)
Saturation Ratios: 10.4 % — ABNORMAL LOW (ref 20.0–50.0)
Transferrin: 317 mg/dL (ref 212.0–360.0)

## 2020-01-28 NOTE — Progress Notes (Signed)
Patient presents to clinic today c/o a few episodes of feeling nervous, jittery and lightheaded. Notes the other day this happened in the late morning. States she ate something and felt better, checking her blood glucose afterwards and noting it to be at 279. States she still felt a little tired that day but no other "spells". Notes similar episode this week that was milder. Overall notes she keeps well-hydrated. Is not always consistent with what she eats. Denies any fever, chills, malaise. Denies chest pain or racing heart. Has noted some SOBOE especially with climbing stairs. Notes this is newer for her and has been present for several months. Denies any change to bowel or bladder habits. Has noted mild increase in anxiety but controlled with Lorazepam PRN. Notes feeling well this morning.   Past Medical History:  Diagnosis Date  . Allergy   . Anemia    hx of  . Arthritis   . Colon cancer ([place]) dx'd 11/2014  . Diabetes mellitus without complication ([place])    TYPE II  . GERD (gastroesophageal reflux disease)   . History of gout   . Hypertension   . Hypothyroidism   . PONV (postoperative nausea and vomiting)     Current Outpatient Medications on File Prior to Visit  Medication Sig Dispense Refill  . ACCU-CHEK AVIVA PLUS test strip     . amLODipine (NORVASC) 2.5 MG tablet Take 1 tablet (2.5 mg total) by mouth daily. 90 tablet 2  . atenolol (TENORMIN) 50 MG tablet Take 1 tablet (50 mg total) by mouth every morning. 90 tablet 1  . Blood Glucose Monitoring Suppl (ACCU-CHEK AVIVA) device Use as instructed 1 each 0  . calcium citrate-vitamin D (CITRACAL+D) 315-200 MG-UNIT tablet Take 1 tablet by mouth 2 (two) times daily.    Marland Kitchen glipiZIDE (GLUCOTROL XL) 5 MG 24 hr tablet TAKE (1) TABLET DAILY WITH BREAKFAST. 90 tablet 1  . levothyroxine (SYNTHROID) 88 MCG tablet Take 1 tablet (88 mcg total) by mouth daily. 90 tablet 2  . LORazepam (ATIVAN) 0.5 MG tablet Take 0.5 mg by mouth every 8 (eight)  hours as needed. for anxiety  0  . omeprazole (PRILOSEC) 20 MG capsule Take 1 capsule (20 mg total) by mouth daily. 90 capsule 2  . telmisartan (MICARDIS) 40 MG tablet TAKE 1 TABLET ONCE DAILY 90 tablet 1  . alendronate (FOSAMAX) 70 MG tablet Take 1 tablet (70 mg total) by mouth every 7 (seven) days. Take with a full glass of water on an empty stomach. (Patient not taking: Reported on 12/21/2019) 12 tablet 1   No current facility-administered medications on file prior to visit.    Allergies  Allergen Reactions  . Aspirin Other (See Comments)    Runny nose   . Codeine Nausea And Vomiting    Family History  Problem Relation Age of Onset  . Diabetes Mother   . Hypertension Other   . Heart disease Father   . Heart attack Brother   . COPD Brother     Social History   Socioeconomic History  . Marital status: Married    Spouse name: Not on file  . Number of children: Not on file  . Years of education: Not on file  . Highest education level: Not on file  Occupational History  . Occupation: Retired  Tobacco Use  . Smoking status: Never Smoker  . Smokeless tobacco: Never Used  Vaping Use  . Vaping Use: Never used  Substance and Sexual Activity  .  Alcohol use: No  . Drug use: No  . Sexual activity: Never  Other Topics Concern  . 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 Difficulty of Paying Living Expenses:   Food Insecurity:   . Worried About Charity fundraiser in the Last Year:   . Arboriculturist in the Last Year:   Transportation Needs:   . Film/video editor (Medical):   Marland Kitchen Lack of Transportation (Non-Medical):   Physical Activity:   . Days of Exercise per Week:   . Minutes of Exercise per Session:   Stress:   . Feeling of  Stress :   Social Connections:   . Frequency of Communication with Friends and Family:   . Frequency of Social Gatherings with Friends and Family:   . Attends Religious Services:   . Active Member of Clubs or Organizations:   . Attends Archivist Meetings:   Marland Kitchen Marital Status:    Review of Systems - See HPI.  All other ROS are negative.  BP 138/80   Pulse 68   Temp 98 F (36.7 C) (Temporal)   Resp 16   Ht 5\' 8"  (1.727 m)   Wt 218 lb (98.9 kg)   SpO2 98%   BMI 33.15 kg/m   Physical Exam Vitals reviewed.  Constitutional:      Appearance: Normal appearance.  HENT:     Head: Normocephalic and atraumatic.     Right Ear: Tympanic membrane normal.     Left Ear: Tympanic membrane normal.  Eyes:     Conjunctiva/sclera: Conjunctivae normal.     Pupils: Pupils are equal, round, and reactive to light.  Cardiovascular:     Rate and Rhythm: Normal rate and regular rhythm.     Pulses: Normal pulses.     Heart sounds: Normal heart sounds.  Pulmonary:     Effort: Pulmonary effort is normal.     Breath sounds: Normal breath sounds.  Musculoskeletal:     Cervical back: Neck supple.  Neurological:     General: No focal deficit present.     Mental Status: She is alert and oriented to person, place, and time.  Psychiatric:        Mood and Affect: Mood normal.     Recent Results (from the past 2160 hour(s))  POCT Urinalysis Dipstick     Status: Abnormal   Collection Time: 12/04/19  1:52 PM  Result Value Ref Range   Color, UA Dark Yellow    Clarity, UA Clear    Glucose, UA Negative Negative   Bilirubin, UA Negative    Ketones, UA Negative    Spec Grav, UA 1.015 1.010 - 1.025   Blood, UA Negative    pH, UA 6.0 5.0 - 8.0   Protein, UA Positive (A) Negative   Urobilinogen, UA 0.2 0.2 or 1.0 E.U./dL   Nitrite, UA Negative    Leukocytes, UA Small (1+) (A) Negative   Appearance     Odor    Urine Culture     Status: None   Collection Time: 12/04/19  2:13 PM   Specimen:  Urine  Result Value Ref Range   MICRO NUMBER: [id number]  SPECIMEN QUALITY: Adequate    Sample Source NOT GIVEN    STATUS: FINAL    Result:      Growth of mixed flora was isolated, suggesting probable contamination. No further testing will be performed. If clinically indicated, recollection using a method to minimize contamination, with prompt transfer to Urine Culture Transport Tube, is  recommended.   Tumor Marker CEA (IN HOUSE-[facility])     Status: Abnormal   Collection Time: 12/29/19  2:36 PM  Result Value Ref Range   CEA ([facility]-In House) 5.11 (H) 0.00 - 5.00 ng/mL    Comment: (NOTE) This test was performed using Architect's Chemiluminescent Microparticle Immunoassay. Values obtained from different assay methods cannot be used interchangeably. Please note that 5-10% of patients who smoke may see CEA levels up to 6.9 ng/mL. Performed at [facility], [place] [address], [place], [place] [address]   CBC with Differential/Platelet     Status: Abnormal   Collection Time: 01/27/20 11:38 AM  Result Value Ref Range   WBC 5.0 4.0 - 10.5 K/uL   RBC 3.47 (L) 3.87 - 5.11 Mil/uL   Hemoglobin 9.7 (L) 12.0 - 15.0 g/dL   HCT 29.7 (L) 36 - 46 %   MCV 85.6 78.0 - 100.0 fl   MCHC 32.8 30.0 - 36.0 g/dL   RDW 16.5 (H) 11.5 - 15.5 %   Platelets 111.0 (L) 150 - 400 K/uL   Neutrophils Relative % 64.1 43 - 77 %   Lymphocytes Relative 29.1 12 - 46 %   Monocytes Relative 5.2 3 - 12 %   Eosinophils Relative 1.0 0 - 5 %   Basophils Relative 0.6 0 - 3 %   Neutro Abs 3.2 1.4 - 7.7 K/uL   Lymphs Abs 1.4 0.7 - 4.0 K/uL   Monocytes Absolute 0.3 0 - 1 K/uL   Eosinophils Absolute 0.0 0 - 0 K/uL   Basophils Absolute 0.0 0 - 0 K/uL  Comprehensive metabolic panel     Status: Abnormal   Collection Time: 01/27/20 11:38 AM  Result Value Ref Range   Sodium 141 135 - 145 mEq/L   Potassium 4.3 3.5 - 5.1 mEq/L   Chloride 107 96 - 112 mEq/L   CO2 27 19 - 32 mEq/L   Glucose, Bld 172 (H) 70 -  99 mg/dL   BUN 17 6 - 23 mg/dL   Creatinine, Ser 1.00 0.40 - 1.20 mg/dL   Total Bilirubin 0.7 0.2 - 1.2 mg/dL   Alkaline Phosphatase 104 39 - 117 U/L   AST 24 0 - 37 U/L   ALT 18 0 - 35 U/L   Total Protein 5.9 (L) 6.0 - 8.3 g/dL   Albumin 3.5 3.5 - 5.2 g/dL   GFR 53.62 (L) >60.00 mL/min   Calcium 8.7 8.4 - 10.5 mg/dL  Hemoglobin A1c     Status: None   Collection Time: 01/27/20 11:38 AM  Result Value Ref Range   Hgb A1c MFr Bld 6.5 4.6 - 6.5 %    Comment: Glycemic Control Guidelines for People with Diabetes:Non Diabetic:  <6%Goal of Therapy: <7%Additional Action Suggested:  >8%   Lipid panel     Status: None   Collection Time: 01/27/20 11:38 AM  Result Value Ref Range   Cholesterol 116 0 - 200 mg/dL    Comment: ATP III Classification       Desirable:  < 200 mg/dL               Borderline High:  200 - 239 mg/dL          High:  > = 240 mg/dL   Triglycerides 92.0 0 - 149 mg/dL    Comment: Normal:  <150 mg/dLBorderline High:  150 - 199 mg/dL   HDL 43.80 >39.00 mg/dL   VLDL 18.4 0.0 - 40.0 mg/dL   LDL Cholesterol 53 0 - 99 mg/dL   Total CHOL/HDL Ratio 3     Comment:                Men          Women1/2 Average Risk     3.4          3.3Average Risk          5.0          4.42X Average Risk          9.6          7.13X Average Risk          15.0          11.0                       NonHDL 71.84     Comment: NOTE:  Non-HDL goal should be 30 mg/dL higher than patient's LDL goal (i.e. LDL goal of < 70 mg/dL, would have non-HDL goal of < 100 mg/dL)  TSH     Status: None   Collection Time: 01/27/20 11:38 AM  Result Value Ref Range   TSH 0.88 0.35 - 4.50 uIU/mL    Assessment/Plan: 1. Episodic lightheadedness 2. SOBOE (shortness of breath on exertion) Unclear etiology. She has not checked glucose in those moments so hard to say if she is having a hypoglycemic episode or just a fluctuation in glucose overall. Discussed proper diet and glucose checks to further assess this. Examination  unremarkable today. Negative OVS. Will check CBC, CMP and TSH today. Will also refer to Cardiology for consideration of echocardiogram.  - CBC with Differential/Platelet - Comprehensive metabolic panel - TSH - Ambulatory referral to Cardiology  3. 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[place]) Repeat labs today to further assess. May need to consider change in medication dosage. Again diet reviewed with patient. She is not eating enough at each meal and needs more protein in the diet as there is virtually none. She is to check glucose each morning and call us early next week with these readings.  - Comprehensive metabolic panel - Hemoglobin A1c - Lipid panel  This visit occurred during the SARS-CoV-2 public health emergency.  Safety protocols were in place, including screening questions prior to the visit, additional usage of staff PPE, and extensive cleaning of exam room while observing appropriate contact time as indicated for disinfecting solutions.     Leeanne Rio, PA-C

## 2020-01-29 ENCOUNTER — Other Ambulatory Visit: Payer: Self-pay | Admitting: Emergency Medicine

## 2020-01-29 DIAGNOSIS — D509 Iron deficiency anemia, unspecified: Secondary | ICD-10-CM

## 2020-01-29 MED ORDER — FERROUS SULFATE 325 (65 FE) MG PO TBEC: 325.0000 mg | DELAYED_RELEASE_TABLET | Freq: Every day | ORAL | 1 refills | Status: DC

## 2020-02-01 ENCOUNTER — Other Ambulatory Visit: Payer: Self-pay

## 2020-02-01 ENCOUNTER — Ambulatory Visit (INDEPENDENT_AMBULATORY_CARE_PROVIDER_SITE_OTHER): Payer: Medicare Other

## 2020-02-01 DIAGNOSIS — R710 Precipitous drop in hematocrit: Secondary | ICD-10-CM | POA: Diagnosis not present

## 2020-02-01 LAB — CBC WITH DIFFERENTIAL/PLATELET
Basophils Absolute: 0 10*3/uL (ref 0.0–0.1)
Basophils Relative: 1 % (ref 0.0–3.0)
Eosinophils Absolute: 0 10*3/uL (ref 0.0–0.7)
Eosinophils Relative: 1.1 % (ref 0.0–5.0)
HCT: 29.8 % — ABNORMAL LOW (ref 36.0–46.0)
Hemoglobin: 9.6 g/dL — ABNORMAL LOW (ref 12.0–15.0)
Lymphocytes Relative: 34.8 % (ref 12.0–46.0)
Lymphs Abs: 1.5 10*3/uL (ref 0.7–4.0)
MCHC: 32.2 g/dL (ref 30.0–36.0)
MCV: 86 fl (ref 78.0–100.0)
Monocytes Absolute: 0.2 10*3/uL (ref 0.1–1.0)
Monocytes Relative: 4.8 % (ref 3.0–12.0)
Neutro Abs: 2.5 10*3/uL (ref 1.4–7.7)
Neutrophils Relative %: 58.3 % (ref 43.0–77.0)
Platelets: 104 10*3/uL — ABNORMAL LOW (ref 150.0–400.0)
RBC: 3.47 Mil/uL — ABNORMAL LOW (ref 3.87–5.11)
RDW: 16 % — ABNORMAL HIGH (ref 11.5–15.5)
WBC: 4.3 10*3/uL (ref 4.0–10.5)

## 2020-02-02 ENCOUNTER — Other Ambulatory Visit: Payer: Self-pay | Admitting: Emergency Medicine

## 2020-02-02 DIAGNOSIS — D509 Iron deficiency anemia, unspecified: Secondary | ICD-10-CM

## 2020-02-02 DIAGNOSIS — D619 Aplastic anemia, unspecified: Secondary | ICD-10-CM

## 2020-02-02 DIAGNOSIS — D649 Anemia, unspecified: Secondary | ICD-10-CM

## 2020-02-02 MED ORDER — FERROUS SULFATE 325 (65 FE) MG PO TBEC: 325.0000 mg | DELAYED_RELEASE_TABLET | Freq: Two times a day (BID) | ORAL | 1 refills | Status: DC

## 2020-02-03 ENCOUNTER — Other Ambulatory Visit: Payer: Self-pay | Admitting: Physician Assistant

## 2020-02-08 ENCOUNTER — Ambulatory Visit (INDEPENDENT_AMBULATORY_CARE_PROVIDER_SITE_OTHER): Payer: Medicare Other

## 2020-02-08 VITALS — Ht 68.0 in | Wt 218.0 lb

## 2020-02-08 DIAGNOSIS — Z0000 Encounter for general adult medical examination without abnormal findings: Secondary | ICD-10-CM | POA: Diagnosis not present

## 2020-02-08 NOTE — Progress Notes (Signed)
Subjective:   Cheryl Jordan is a 79 y.o. female who presents for an Initial Medicare Annual Wellness Visit.  I connected with Tyeasha today by telephone and verified that I am speaking with the correct person using two identifiers. Location patient: home Location provider: work Persons participating in the virtual visit: patient, Marine scientist.    I discussed the limitations, risks, security and privacy concerns of performing an evaluation and management service by telephone and the availability of in person appointments. I also discussed with the patient that there may be a patient responsible charge related to this service. The patient expressed understanding and verbally consented to this telephonic visit.    Interactive audio and video telecommunications were attempted between this provider and patient, however failed, due to patient having technical difficulties OR patient did not have access to video capability.  We continued and completed visit with audio only.  Some vital signs may be absent or patient reported.   Time Spent with patient on telephone encounter: 20 minutes  Review of Systems      Cardiac Risk Factors include: advanced age (>56men, >57 women);dyslipidemia;hypertension;obesity (BMI >30kg/m2);sedentary lifestyle;diabetes mellitus     Objective:    Today's Vitals   02/08/20 1154  Weight: (!) 218 lb (98.9 kg)  Height: 5\' 8"  (1.727 m)   Body mass index is 33.15 kg/m.  Advanced Directives 02/08/2020 12/21/2019 06/30/2019 06/30/2018 12/30/2017 11/16/2017 02/06/2017  Does Patient Have a Medical Advance Directive? No No No No No No No  Would patient like information on creating a medical advance directive? No - Patient declined - No - Patient declined No - Patient declined No - Patient declined No - Patient declined No - Patient declined  Pre-existing out of facility DNR order (yellow form or pink MOST form) - - - - - - -    Current Medications (verified) Outpatient Encounter  Medications as of 02/08/2020  Medication Sig  . ACCU-CHEK AVIVA PLUS test strip   . amLODipine (NORVASC) 2.5 MG tablet Take 1 tablet (2.5 mg total) by mouth daily.  Marland Kitchen atenolol (TENORMIN) 50 MG tablet TAKE 1 TABLET BY MOUTH IN  THE MORNING  . Blood Glucose Monitoring Suppl (ACCU-CHEK AVIVA) device Use as instructed  . calcium citrate-vitamin D (CITRACAL+D) 315-200 MG-UNIT tablet Take 1 tablet by mouth 2 (two) times daily.  . ferrous sulfate 325 (65 FE) MG EC tablet Take 1 tablet (325 mg total) by mouth 2 (two) times daily with a meal.  . glipiZIDE (GLUCOTROL XL) 5 MG 24 hr tablet TAKE (1) TABLET DAILY WITH BREAKFAST.  Marland Kitchen levothyroxine (SYNTHROID) 88 MCG tablet Take 1 tablet (88 mcg total) by mouth daily.  Marland Kitchen LORazepam (ATIVAN) 0.5 MG tablet Take 0.5 mg by mouth every 8 (eight) hours as needed. for anxiety  . omeprazole (PRILOSEC) 20 MG capsule Take 1 capsule (20 mg total) by mouth daily.  Marland Kitchen telmisartan (MICARDIS) 40 MG tablet TAKE 1 TABLET ONCE DAILY  . alendronate (FOSAMAX) 70 MG tablet Take 1 tablet (70 mg total) by mouth every 7 (seven) days. Take with a full glass of water on an empty stomach. (Patient not taking: Reported on 02/08/2020)   No facility-administered encounter medications on file as of 02/08/2020.    Allergies (verified) Aspirin and Codeine   History: Past Medical History:  Diagnosis Date  . Allergy   . Anemia    hx of  . Arthritis   . Colon cancer ([place]) dx'd 11/2014  . Diabetes mellitus without complication ([place])  TYPE II  . GERD (gastroesophageal reflux disease)   . History of gout   . Hypertension   . Hypothyroidism   . PONV (postoperative nausea and vomiting)    Past Surgical History:  Procedure Laterality Date  . CHOLECYSTECTOMY    . IR RADIOLOGIST EVAL & MGMT  01/24/2017  . KNEE SURGERY    . NEPHRECTOMY Left 02/10/2015   Procedure: OPEN RETROPERINTONEAL EXPLORATION LEFT RENAL  CYST DECORTICATION X 5;  Surgeon: Cleon Gustin, MD;  Location: [facility];   Service: Urology;  Laterality: Left;  . PARTIAL COLECTOMY N/A 02/10/2015   Procedure: OPEN RIGHT  COLECTOMY ;  Surgeon: [name], MD;  Location: [facility];  Service: General;  Laterality: N/A;  . TUBAL LIGATION     Family History  Problem Relation Age of Onset  . Diabetes Mother   . Hypertension Other   . Heart disease Father   . Heart attack Brother   . COPD Brother    Social History   Socioeconomic History  . Marital status: Married    Spouse name: Not on file  . Number of children: Not on file  . Years of education: Not on file  . Highest education level: Not on file  Occupational History  . Occupation: Retired  Tobacco Use  . Smoking status: Never Smoker  . Smokeless tobacco: Never Used  Vaping Use  . Vaping Use: Never used  Substance and Sexual Activity  . Alcohol use: No  . Drug use: No  . Sexual activity: Never  Other Topics Concern  . Not on file  Social History Narrative   03/03/15-Married, husband Stefanie Libel 11 years   #2 grown sons and #2 grand daughters   Has outdoor cat   Retired from [employer] to garden and can food when well   Social Determinants of Radio broadcast assistant Strain: Low Risk   . Difficulty of Paying Living Expenses: Not hard at all  Food Insecurity: No Food Insecurity  . Worried About Charity fundraiser in the Last Year: Never true  . Ran Out of Food in the Last Year: Never true  Transportation Needs: No Transportation Needs  . Lack of Transportation (Medical): No  . Lack of Transportation (Non-Medical): No  Physical Activity: Inactive  . Days of Exercise per Week: 0 days  . Minutes of Exercise per Session: 0 min  Stress: No Stress Concern Present  . Feeling of Stress : Not at all  Social Connections: Socially Integrated  . Frequency of Communication with Friends and Family: More than three times a week  . Frequency of Social Gatherings with Friends and Family: More than three times a week  . Attends Religious  Services: More than 4 times per year  . Active Member of Clubs or Organizations: Yes  . Attends Archivist Meetings: More than 4 times per year  . Marital Status: Married    Tobacco Counseling Counseling given: Not Answered   Clinical Intake:  Pre-visit preparation completed: Yes  Pain : No/denies pain     Nutritional Status: BMI > 30  Obese Nutritional Risks: None Diabetes: Yes CBG done?: No Did pt. bring in CBG monitor from home?: No  How often do you need to have someone help you when you read instructions, pamphlets, or other written materials from your doctor or pharmacy?: 1 - Never What is the last grade level you completed in school?: high school graduate  Nutrition Risk Assessment:  Has the patient had any N/V/D within the last 2 months?  No  Does the patient have any non-healing wounds?  No  Has the patient had any unintentional weight loss or weight gain?  No   Diabetes:  Is the patient diabetic?  Yes  If diabetic, was a CBG obtained today?  No  Did the patient bring in their glucometer from home?  No virtual visit How often do you monitor your CBG's? daily.   Financial Strains and Diabetes Management:  Are you having any financial strains with the device, your supplies or your medication? No .  Does the patient want to be seen by Chronic Care Management for management of their diabetes?  No  Would the patient like to be referred to a Nutritionist or for Diabetic Management?  No   Diabetic Exams:  Diabetic Eye Exam: Completed 04/13/2019.   Diabetic Foot Exam:  Pt has been advised about the importance in completing this exam. To be completed by PCP   Interpreter Needed?: No  Information entered by :: Caroleen Hamman LPN   Activities of Daily Living In your present state of health, do you have any difficulty performing the following activities: 02/08/2020  Hearing? N  Vision? N  Difficulty concentrating or making decisions? N  Walking or  climbing stairs? N  Dressing or bathing? N  Doing errands, shopping? N  Preparing Food and eating ? N  Using the Toilet? Y  Comment wears depends at night  In the past six months, have you accidently leaked urine? N  Do you have problems with loss of bowel control? N  Managing your Medications? N  Managing your Finances? N  Housekeeping or managing your Housekeeping? N  Some recent data might be hidden     Immunizations and Health Maintenance Immunization History  Administered Date(s) Administered  . Moderna SARS-COVID-2 Vaccination 08/08/2019, 09/05/2019   Health Maintenance Due  Topic Date Due  . Hepatitis C Screening  Never done  . DTAP VACCINES (1) Never done  . TETANUS/TDAP  Never done  . PNA vac Low Risk Adult (1 of 2 - PCV13) Never done  . FOOT EXAM  01/29/2020    Patient Care Team: Delorse Limber as PCP - General (Family Medicine) Luberta Mutter, MD as Consulting Physician (Ophthalmology) Alyson Ingles Candee Furbish, MD as Consulting Physician (Urology)  Indicate any recent Medical Services you may have received from other than [facility] providers in the past year (date may be approximate).     Assessment:   This is a routine wellness examination for [facility].  Hearing/Vision screen  Hearing Screening   125Hz  250Hz  500Hz  1000Hz  2000Hz  3000Hz  4000Hz  6000Hz  8000Hz   Right ear:           Left ear:           Comments: No issues  Vision Screening Comments: Wears glasses Has Cataracts Last eye exam 03/2020-Dr, McCuran  Dietary issues and exercise activities discussed: Current Exercise Habits: The patient does not participate in regular exercise at present, Exercise limited by: None identified  Goals Addressed            This Visit's Progress   . Patient Stated       No goals at this time      Depression Screen PHQ 2/9 Scores 02/08/2020 01/29/2019  PHQ - 2 Score 0 0  PHQ- 9 Score - 0    Fall Risk Fall Risk  02/08/2020  Falls in the past year? 0  Number  falls in past yr: 0  Injury with Fall? 0  Follow up Falls prevention discussed    [place]:  Any stairs in or around the home? Yes  If so, are there any without handrails? No   Home free of loose throw rugs in walkways, pet beds, electrical cords, etc? Yes  Adequate lighting in your home to reduce risk of falls? Yes   ASSISTIVE DEVICES UTILIZED TO PREVENT FALLS:  Life alert? No  Use of a cane, walker or w/c? No  Grab bars in the bathroom? Yes  Shower chair or bench in shower? No  Elevated toilet seat or a handicapped toilet? No    TIMED UP AND GO:  Was the test performed? No . Virtual visit  DME/home health order needed?  No    Cognitive Function:No cognitive impairment noted        Screening Tests Health Maintenance  Topic Date Due  . Hepatitis C Screening  Never done  . DTAP VACCINES (1) Never done  . TETANUS/TDAP  Never done  . PNA vac Low Risk Adult (1 of 2 - PCV13) Never done  . FOOT EXAM  01/29/2020  . DTaP/Tdap/Td (1 - Tdap) 05/05/2020 (Originally 01/19/1960)  . INFLUENZA VACCINE  02/14/2020  . OPHTHALMOLOGY EXAM  04/12/2020  . HEMOGLOBIN A1C  07/29/2020  . DEXA SCAN  Completed  . COVID-19 Vaccine  Completed    Qualifies for Shingles Vaccine? Yes  Zostavax completed no. Due for Shingrix. Education has been provided regarding the importance of this vaccine. Pt has been advised to call insurance company to determine out of pocket expense. Advised may also receive vaccine at local pharmacy or Health Dept. Verbalized acceptance and understanding.  Tdap: Although this vaccine is not a covered service during a Wellness Exam, does the patient still wish to receive this vaccine today?  No .  Education has been provided regarding the importance of this vaccine. Advised may receive this vaccine at local pharmacy or Health Dept. Aware to provide a copy of the vaccination record if obtained from local pharmacy or Health Dept. Verbalized  acceptance and understanding.  Flu Vaccine: Due 03/2020  Pneumococcal Vaccine: Due for Pneumococcal vaccine. Does the patient want to receive this vaccine today?  No . Education has been provided regarding the importance of this vaccine but still declined. Discuss with PCP at next office visit  COVID-19 vaccine:  Vaccines received  Cancer Screenings:  Colorectal Screening:  No longer required.   Mammogram: Patient plans to call to schedule an appt.  Bone Density: Completed 04/23/2019. Results reflect OSTEOPOROSIS. Repeat every 2 years.   Lung Cancer Screening: (Low Dose CT Chest recommended if Age 61-80 years, 30 pack-year currently smoking OR have quit w/in 15years.) does not qualify.    Additional Screening:  Hepatitis C Screening: Discuss with PCP   Dental Screening: Recommended annual dental exams for proper oral hygiene  Community Resource Referral:  CRR required this visit?  No     Plan:  I have personally reviewed and addressed the Medicare Annual Wellness questionnaire and have noted the following in the patient's chart:  A. Medical and social history B. Use of alcohol, tobacco or illicit drugs  C. Current medications and supplements D. Functional ability and status E.  Nutritional status F.  Physical activity G. Advance directives H. List of other physicians I.  Hospitalizations, surgeries, and ER visits in previous 12 months J.  [place] such as hearing and vision  if needed, cognitive and depression L. Referrals and appointments   In addition, I have reviewed and discussed with patient certain preventive protocols, quality metrics, and best practice recommendations. A written personalized care plan for preventive services as well as general preventive health recommendations were provided to patient.  Due to this being a telephonic visit, the after visit summary with patients personalized plan was offered to patient via mail or my-chart. Patient  declined at this time.  Signed,    Marta Antu, LPN   5/70/1779  Nurse Health Advisor    Nurse Notes: [facility]

## 2020-02-08 NOTE — Patient Instructions (Signed)
Cheryl Jordan , Thank you for taking time to come for your Medicare Wellness Visit. I appreciate your ongoing commitment to your health goals. Please review the following plan we discussed and let me know if I can assist you in the future.   Screening recommendations/referrals: Colonoscopy: Completed 09/18/2017-No longer indicated Mammogram: Completed 05/05/2018-Due-Please call the office when you are ready to schedule Bone Density: Completed 04/23/2019-Due 04/22/2021 Recommended yearly ophthalmology/optometry visit for glaucoma screening and checkup Recommended yearly dental visit for hygiene and checkup  Vaccinations: Influenza vaccine: Due 03/2020 Pneumococcal vaccine: Due-Discuss with PCP at next visit Tdap vaccine: Discuss with pharmacy Shingles vaccine: Discuss with pharmacy  Covid-19:Completed  vaccines  Advanced directives: Declined  Conditions/risks identified: See problem list  Next appointment: Follow up in one year for your annual wellness visit    Preventive Care 19 Years and Older, Female Preventive care refers to lifestyle choices and visits with your health care provider that can promote health and wellness. What does preventive care include?  A yearly physical exam. This is also called an annual well check.  Dental exams once or twice a year.  Routine eye exams. Ask your health care provider how often you should have your eyes checked.  Personal lifestyle choices, including:  Daily care of your teeth and gums.  Regular physical activity.  Eating a healthy diet.  Avoiding tobacco and drug use.  Limiting alcohol use.  Practicing safe sex.  Taking low-dose aspirin every day.  Taking vitamin and mineral supplements as recommended by your health care provider. What happens during an annual well check? The services and screenings done by your health care provider during your annual well check will depend on your age, overall health, lifestyle risk factors, and  family history of disease. Counseling  Your health care provider may ask you questions about your:  Alcohol use.  Tobacco use.  Drug use.  Emotional well-being.  Home and relationship well-being.  Sexual activity.  Eating habits.  History of falls.  Memory and ability to understand (cognition).  Work and work Statistician.  Reproductive health. Screening  You may have the following tests or measurements:  Height, weight, and BMI.  Blood pressure.  Lipid and cholesterol levels. These may be checked every 5 years, or more frequently if you are over 47 years old.  Skin check.  Lung cancer screening. You may have this screening every year starting at age 24 if you have a 30-pack-year history of smoking and currently smoke or have quit within the past 15 years.  Fecal occult blood test (FOBT) of the stool. You may have this test every year starting at age 41.  Flexible sigmoidoscopy or colonoscopy. You may have a sigmoidoscopy every 5 years or a colonoscopy every 10 years starting at age 23.  Hepatitis C blood test.  Hepatitis B blood test.  Sexually transmitted disease (STD) testing.  Diabetes screening. This is done by checking your blood sugar (glucose) after you have not eaten for a while (fasting). You may have this done every 1-3 years.  Bone density scan. This is done to screen for osteoporosis. You may have this done starting at age 78.  Mammogram. This may be done every 1-2 years. Talk to your health care provider about how often you should have regular mammograms. Talk with your health care provider about your test results, treatment options, and if necessary, the need for more tests. Vaccines  Your health care provider may recommend certain vaccines, such as:  Influenza vaccine. This  is recommended every year.  Tetanus, diphtheria, and acellular pertussis (Tdap, Td) vaccine. You may need a Td booster every 10 years.  Zoster vaccine. You may need this  after age 72.  Pneumococcal 13-valent conjugate (PCV13) vaccine. One dose is recommended after age 60.  Pneumococcal polysaccharide (PPSV23) vaccine. One dose is recommended after age 78. Talk to your health care provider about which screenings and vaccines you need and how often you need them. This information is not intended to replace advice given to you by your health care provider. Make sure you discuss any questions you have with your health care provider. Document Released: 07/29/2015 Document Revised: 03/21/2016 Document Reviewed: 05/03/2015 Elsevier Interactive Patient Education  2017 [place] Prevention in the Home Falls can cause injuries. They can happen to people of all ages. There are many things you can do to make your home safe and to help prevent falls. What can I do on the outside of my home?  Regularly fix the edges of walkways and driveways and fix any cracks.  Remove anything that might make you trip as you walk through a door, such as a raised step or threshold.  Trim any bushes or trees on the path to your home.  Use bright outdoor lighting.  Clear any walking paths of anything that might make someone trip, such as rocks or tools.  Regularly check to see if handrails are loose or broken. Make sure that both sides of any steps have handrails.  Any raised decks and porches should have guardrails on the edges.  Have any leaves, snow, or ice cleared regularly.  Use sand or salt on walking paths during winter.  Clean up any spills in your garage right away. This includes oil or grease spills. What can I do in the bathroom?  Use night lights.  Install grab bars by the toilet and in the tub and shower. Do not use towel bars as grab bars.  Use non-skid mats or decals in the tub or shower.  If you need to sit down in the shower, use a plastic, non-slip stool.  Keep the floor dry. Clean up any water that spills on the floor as soon as it  happens.  Remove soap buildup in the tub or shower regularly.  Attach bath mats securely with double-sided non-slip rug tape.  Do not have throw rugs and other things on the floor that can make you trip. What can I do in the bedroom?  Use night lights.  Make sure that you have a light by your bed that is easy to reach.  Do not use any sheets or blankets that are too big for your bed. They should not hang down onto the floor.  Have a firm chair that has side arms. You can use this for support while you get dressed.  Do not have throw rugs and other things on the floor that can make you trip. What can I do in the kitchen?  Clean up any spills right away.  Avoid walking on wet floors.  Keep items that you use a lot in easy-to-reach places.  If you need to reach something above you, use a strong step stool that has a grab bar.  Keep electrical cords out of the way.  Do not use floor polish or wax that makes floors slippery. If you must use wax, use non-skid floor wax.  Do not have throw rugs and other things on the floor that can make you  trip. What can I do with my stairs?  Do not leave any items on the stairs.  Make sure that there are handrails on both sides of the stairs and use them. Fix handrails that are broken or loose. Make sure that handrails are as long as the stairways.  Check any carpeting to make sure that it is firmly attached to the stairs. Fix any carpet that is loose or worn.  Avoid having throw rugs at the top or bottom of the stairs. If you do have throw rugs, attach them to the floor with carpet tape.  Make sure that you have a light switch at the top of the stairs and the bottom of the stairs. If you do not have them, ask someone to add them for you. What else can I do to help prevent falls?  Wear shoes that:  Do not have high heels.  Have rubber bottoms.  Are comfortable and fit you well.  Are closed at the toe. Do not wear sandals.  If you  use a stepladder:  Make sure that it is fully opened. Do not climb a closed stepladder.  Make sure that both sides of the stepladder are locked into place.  Ask someone to hold it for you, if possible.  Clearly mark and make sure that you can see:  Any grab bars or handrails.  First and last steps.  Where the edge of each step is.  Use tools that help you move around (mobility aids) if they are needed. These include:  Canes.  Walkers.  Scooters.  Crutches.  Turn on the lights when you go into a dark area. Replace any light bulbs as soon as they burn out.  Set up your furniture so you have a clear path. Avoid moving your furniture around.  If any of your floors are uneven, fix them.  If there are any pets around you, be aware of where they are.  Review your medicines with your doctor. Some medicines can make you feel dizzy. This can increase your chance of falling. Ask your doctor what other things that you can do to help prevent falls. This information is not intended to replace advice given to you by your health care provider. Make sure you discuss any questions you have with your health care provider. Document Released: 04/28/2009 Document Revised: 12/08/2015 Document Reviewed: 08/06/2014 Elsevier Interactive Patient Education  2017 [employer].

## 2020-02-29 ENCOUNTER — Ambulatory Visit (INDEPENDENT_AMBULATORY_CARE_PROVIDER_SITE_OTHER): Payer: Medicare Other

## 2020-02-29 ENCOUNTER — Other Ambulatory Visit: Payer: Self-pay

## 2020-02-29 DIAGNOSIS — R710 Precipitous drop in hematocrit: Secondary | ICD-10-CM

## 2020-03-01 ENCOUNTER — Other Ambulatory Visit: Payer: Self-pay | Admitting: Emergency Medicine

## 2020-03-01 DIAGNOSIS — E1159 Type 2 diabetes mellitus with other circulatory complications: Secondary | ICD-10-CM

## 2020-03-01 DIAGNOSIS — I152 Hypertension secondary to endocrine disorders: Secondary | ICD-10-CM

## 2020-03-01 MED ORDER — TELMISARTAN 40 MG PO TABS: 40.0000 mg | ORAL_TABLET | Freq: Every day | ORAL | 2 refills | Status: DC

## 2020-03-02 ENCOUNTER — Other Ambulatory Visit: Payer: Self-pay

## 2020-03-02 DIAGNOSIS — K921 Melena: Secondary | ICD-10-CM

## 2020-03-02 DIAGNOSIS — D509 Iron deficiency anemia, unspecified: Secondary | ICD-10-CM

## 2020-03-02 LAB — FECAL OCCULT BLOOD, IMMUNOCHEMICAL: Fecal Occult Bld: POSITIVE — AB

## 2020-03-04 ENCOUNTER — Other Ambulatory Visit: Payer: Self-pay

## 2020-03-04 ENCOUNTER — Ambulatory Visit (INDEPENDENT_AMBULATORY_CARE_PROVIDER_SITE_OTHER): Payer: Medicare Other

## 2020-03-04 DIAGNOSIS — D509 Iron deficiency anemia, unspecified: Secondary | ICD-10-CM

## 2020-03-04 LAB — CBC WITH DIFFERENTIAL/PLATELET
Basophils Absolute: 0 10*3/uL (ref 0.0–0.1)
Basophils Relative: 0.4 % (ref 0.0–3.0)
Eosinophils Absolute: 0.1 10*3/uL (ref 0.0–0.7)
Eosinophils Relative: 1.2 % (ref 0.0–5.0)
HCT: 34.3 % — ABNORMAL LOW (ref 36.0–46.0)
Hemoglobin: 11 g/dL — ABNORMAL LOW (ref 12.0–15.0)
Lymphocytes Relative: 34.6 % (ref 12.0–46.0)
Lymphs Abs: 1.5 10*3/uL (ref 0.7–4.0)
MCHC: 32 g/dL (ref 30.0–36.0)
MCV: 90.6 fl (ref 78.0–100.0)
Monocytes Absolute: 0.2 10*3/uL (ref 0.1–1.0)
Monocytes Relative: 4.7 % (ref 3.0–12.0)
Neutro Abs: 2.6 10*3/uL (ref 1.4–7.7)
Neutrophils Relative %: 59.1 % (ref 43.0–77.0)
Platelets: 94 10*3/uL — ABNORMAL LOW (ref 150.0–400.0)
RBC: 3.79 Mil/uL — ABNORMAL LOW (ref 3.87–5.11)
RDW: 19.9 % — ABNORMAL HIGH (ref 11.5–15.5)
WBC: 4.3 10*3/uL (ref 4.0–10.5)

## 2020-03-23 ENCOUNTER — Encounter: Payer: Self-pay | Admitting: Internal Medicine

## 2020-03-23 ENCOUNTER — Ambulatory Visit: Payer: Medicare Other | Admitting: Internal Medicine

## 2020-03-23 VITALS — BP 170/80 | HR 60 | Ht 67.0 in | Wt 220.0 lb

## 2020-03-23 DIAGNOSIS — D509 Iron deficiency anemia, unspecified: Secondary | ICD-10-CM | POA: Diagnosis not present

## 2020-03-23 DIAGNOSIS — R195 Other fecal abnormalities: Secondary | ICD-10-CM

## 2020-03-23 DIAGNOSIS — K219 Gastro-esophageal reflux disease without esophagitis: Secondary | ICD-10-CM

## 2020-03-23 DIAGNOSIS — R197 Diarrhea, unspecified: Secondary | ICD-10-CM

## 2020-03-23 DIAGNOSIS — Z85038 Personal history of other malignant neoplasm of large intestine: Secondary | ICD-10-CM | POA: Diagnosis not present

## 2020-03-23 DIAGNOSIS — K7460 Unspecified cirrhosis of liver: Secondary | ICD-10-CM

## 2020-03-23 MED ORDER — SUTAB 1479-225-188 MG PO TABS
1.0000 | ORAL_TABLET | Freq: Once | ORAL | 0 refills | Status: AC
Start: 2020-03-23 — End: 2020-03-23

## 2020-03-23 NOTE — Progress Notes (Signed)
HISTORY OF PRESENT ILLNESS:  Cheryl Jordan is a 79 y.o. 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 She is followed closely by oncology and was last seen December 29, 2019 (office note reviewed). She is sent today by her primary care provider regarding new onset iron deficiency anemia and Hemoccult-positive stool. Previous GI care with [facility] Dr. Penelope Coop. Now switching to our group as Dr. Penelope Coop is retiring. The patient's hemoglobin approximately 1 year ago was 12.9. This summer she reported fatigue. Hemoglobin was 9.7 with MCV 86. Platelets 111. Iron saturation 10%. She was placed on oral iron therapy. Follow-up hemoglobin 1 month later was 11.0. Patient does have a history of GERD for which he takes Prilosec. This helps. No dysphagia. She reports remote upper endoscopy. Next, she reports chronic problems with postprandial diarrhea and urgency. Worse after her gallbladder surgery. Even worse after colon surgery. No bleeding. No melena. No weight loss. She also reports bloating. She takes Glucotrol for her diabetes. Her last complete colonoscopy was performed March 2019. She was found to have diminutive polyps for which follow-up in 3 years was recommended. In addition to the aforementioned blood work results and prior colonoscopy, CT scan of the abdomen and pelvis with contrast was performed June 2019. Patient was found to have an irregular nodular liver compatible with cirrhosis. There is no new dominant mass or evidence of metastatic disease. Suspect he is having a renal cell carcinoma that is being monitored by urology.  REVIEW OF SYSTEMS:  All non-GI ROS negative unless otherwise stated in the HPI except for muscle cramps, blood in the urine, urinary leakage, ankle swelling, shortness  of breath  Past Medical History:  Diagnosis Date  . Allergy   . Anemia    hx of  . Anxiety   . Arthritis   . Colon cancer ([place]) dx'd 11/2014  . 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 Fatty liver   . Gallbladder disease   . GERD (gastroesophageal reflux disease)   . History of gout   . Hypertension   . Hypothyroidism   . Kidney disorder    spot on left kidney  . Osteoporosis   . PONV (postoperative nausea and vomiting)     Past Surgical History:  Procedure Laterality Date  . CHOLECYSTECTOMY    . IR RADIOLOGIST EVAL & MGMT  01/24/2017  . KNEE CARTILAGE SURGERY Bilateral   . NEPHRECTOMY Left 02/10/2015   Procedure: OPEN RETROPERINTONEAL EXPLORATION LEFT RENAL  CYST DECORTICATION X 5;  Surgeon: Cleon Gustin, MD;  Location: [facility];  Service: Urology;  Laterality: Left;  . PARTIAL COLECTOMY N/A 02/10/2015   Procedure: OPEN RIGHT  COLECTOMY ;  Surgeon: [name], MD;  Location: [facility];  Service: General;  Laterality: N/A;  . TUBAL LIGATION      Social History [name]  reports that she has never smoked. She has never used smokeless tobacco. She reports that she does not drink alcohol and does not use drugs.  family history includes COPD in her brother and brother; Diabetes in her mother; GER disease in her sister; Heart attack in her brother; Heart disease in her father; Hypertension in her brother, sister, and another family member; Lung cancer in her brother.  Allergies  Allergen Reactions  . Aspirin Other (See Comments)  Runny nose   . Codeine Nausea And Vomiting       PHYSICAL EXAMINATION: Vital signs: BP (!) 170/80 (BP Location: Left Arm, Patient Position: Sitting, Cuff Size: Normal)   Pulse 60   Ht 5\' 7"  (1.702 m) Comment: height measured without shoes  Wt 220 lb (99.8 kg)   BMI 34.46 kg/m   Constitutional: Overweight elderly female, no acute distress Psychiatric: alert and oriented x3, cooperative Eyes: extraocular movements  intact, anicteric, conjunctiva pink Mouth: oral pharynx moist, no lesions Neck: supple no lymphadenopathy Cardiovascular: heart regular rate and rhythm, no murmur Lungs: clear to auscultation bilaterally Abdomen: soft, nontender, obese, nondistended, no obvious ascites, no peritoneal signs, normal bowel sounds, no organomegaly Rectal: Deferred until colonoscopy Extremities: no clubbing or cyanosis. Trace lower extremity edema bilaterally Skin: no lesions on visible extremities Neuro: No focal deficits. No asterixis.    ASSESSMENT:  1. Iron deficiency anemia with Hemoccult-positive stool. Rule out GI mucosal lesion. Possibilities include recurrent neoplasia, AVMs, pleural hypertensive gastropathy. 2. 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 Last colonoscopy elsewhere March 2016 with diminutive polyps 3. 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 Multiple medical problems including obesity and diabetes 5. GERD. Symptoms controlled on PPI 6. Chronic postprandial diarrhea with urgency. Rule out IBS. Rule out also related. Rule out microscopic colitis   PLAN:  1. Schedule colonoscopy with biopsies to evaluate iron deficiency anemia, Hemoccult positive stool and diarrhea. Patient is HIGH risk given her age and comorbidities.The nature of the procedure, as well as the risks, benefits, and alternatives were carefully and thoroughly reviewed with the patient. Ample time for discussion and questions allowed. The patient understood, was satisfied, and agreed to proceed. 2. Schedule upper endoscopy to evaluate iron deficiency anemia and Hemoccult-positive stool. Patient is HIGH RISK given her age and comorbidities.The nature of the procedure, as well as the risks, benefits, and alternatives were carefully and thoroughly reviewed with the patient. Ample time for discussion and questions allowed. The patient understood, was  satisfied, and agreed to proceed. 3. Reflux precautions 4. Continue PPI 5. Hold iron 1 week prior to procedure to assist with colon cleansing for her procedures 6. Hold diabetic medication to avoid unwanted hypoglycemia 7. Ongoing general care with PCP and multiple specialists Total time of 60 minutes was spent preparing to see the patient, reviewing outside test, x-rays, laboratories, and colonoscopy report. Reviewing outside histories. Obtaining, personally, the comprehensive history. Performing comprehensive physical examination. Counseling the patient regarding multiple above listed issues, ordering advanced endoscopic procedures, and documenting clinical information in the health record.

## 2020-03-23 NOTE — Patient Instructions (Signed)
You have been scheduled for an endoscopy and colonoscopy. Please follow the written instructions given to you at your visit today. Please pick up your prep supplies at the pharmacy within the next 1-3 days. If you use inhalers (even only as needed), please bring them with you on the day of your procedure.    Hold your iron for one week prior to your procedure

## 2020-03-29 NOTE — Progress Notes (Signed)
Cardiology Office Note   Date:  03/30/2020   ID:  LAPORSCHA LINEHAN, DOB 07/11/1941, MRN [id number]  PCP:  Brunetta Jeans, PA-C  Cardiologist:   No primary care provider on file. Referring:  Brunetta Jeans, PA-C  Chief Complaint  Patient presents with  . Shortness of Breath      History of Present Illness: Cheryl Jordan is a 79 y.o. female who is referred by Brunetta Jeans, PA-C for evaluation of DOE.   She does not have any cardiac history other than an overnight hospital stay for chest pain.  I did review these records and it was felt to be atypical. She did have an echo in 2019 with no significant abnormalities.  This was at the time of that hospitalization.  She says she has been getting short of breath for the last couple of months.  This is worse with activity such as walking upstairs.  She can make it up the stairs which she has to collect herself although she does not have to stop when she gets to the top.  She does not have resting shortness of breath, PND or orthopnea.  He does have some resting episodes of shortness of breath, on sporadically.  She has some mild feet swelling.  This has been progressive over a couple months 2.  She denies any chest pressure, neck or arm discomfort.  She has had some slow steady weight.   Past Medical History:  Diagnosis Date  . Allergy   . Anemia    hx of  . Anxiety   . Arthritis   . Colon cancer ([place]) dx'd 11/2014  . Diabetes mellitus without complication ([place])    TYPE II  . Fatty liver   . Gallbladder disease   . GERD (gastroesophageal reflux disease)   . History of gout   . Hypertension   . Hypothyroidism   . Kidney disorder    spot on left kidney  . Osteoporosis   . PONV (postoperative nausea and vomiting)     Past Surgical History:  Procedure Laterality Date  . CHOLECYSTECTOMY    . IR RADIOLOGIST EVAL & MGMT  01/24/2017  . KNEE CARTILAGE SURGERY Bilateral   . NEPHRECTOMY Left 02/10/2015   Procedure: OPEN  RETROPERINTONEAL EXPLORATION LEFT RENAL  CYST DECORTICATION X 5;  Surgeon: Cleon Gustin, MD;  Location[facility];  Service: Urology;  Laterality: Left;  . PARTIAL COLECTOMY N/A 02/10/2015   Procedure: OPEN RIGHT  COLECTOMY ;  Surgeon: [name], MD;  Location: [facility];  Service: General;  Laterality: N/A;  . TUBAL LIGATION       Current Outpatient Medications  Medication Sig Dispense Refill  . amLODipine (NORVASC) 2.5 MG tablet Take 1 tablet (2.5 mg total) by mouth daily. 90 tablet 2  . atenolol (TENORMIN) 50 MG tablet TAKE 1 TABLET BY MOUTH IN  THE MORNING 90 tablet 2  . calcium citrate-vitamin D (CITRACAL+D) 315-200 MG-UNIT tablet Take 1 tablet by mouth 2 (two) times daily.    . ferrous sulfate 325 (65 FE) MG EC tablet Take 1 tablet (325 mg total) by mouth 2 (two) times daily with a meal. 90 tablet 1  . glipiZIDE (GLUCOTROL XL) 5 MG 24 hr tablet TAKE (1) TABLET DAILY WITH BREAKFAST. 90 tablet 1  . levothyroxine (SYNTHROID) 88 MCG tablet Take 1 tablet (88 mcg total) by mouth daily. 90 tablet 2  . LORazepam (ATIVAN) 0.5 MG tablet Take 0.5 mg by mouth every  8 (eight) hours as needed. for anxiety  0  . omeprazole (PRILOSEC) 20 MG capsule Take 1 capsule (20 mg total) by mouth daily. 90 capsule 2  . telmisartan (MICARDIS) 40 MG tablet Take 1 tablet (40 mg total) by mouth daily. 90 tablet 2  . ACCU-CHEK AVIVA PLUS test strip     . Blood Glucose Monitoring Suppl (ACCU-CHEK AVIVA) device Use as instructed 1 each 0   No current facility-administered medications for this visit.    Allergies:   Aspirin and Codeine    Social History:  The patient  reports that she has never smoked. She has never used smokeless tobacco. She reports that she does not drink alcohol and does not use drugs.   Family History:  The patient's family history includes COPD in her brother, brother, and father; Diabetes in her mother; GER disease in her sister; Heart attack in her brother; Hypertension in her brother,  sister, and another family member; Lung cancer in her brother.    ROS:  Please see the history of present illness.   Otherwise, review of systems are positive for none.   All other systems are reviewed and negative.    PHYSICAL EXAM: VS:  BP (!) 156/74   Pulse 65   Ht 5\' 7"  (1.702 m)   Wt 222 lb (100.7 kg)   BMI 34.77 kg/m  , BMI Body mass index is 34.77 kg/m. GENERAL:  Well appearing HEENT:  Pupils equal round and reactive, fundi not visualized, oral mucosa unremarkable NECK:  No jugular venous distention, waveform within normal limits, carotid upstroke brisk and symmetric, no bruits, no thyromegaly LYMPHATICS:  No cervical, inguinal adenopathy LUNGS:  Clear to auscultation bilaterally BACK:  No CVA tenderness CHEST:  Unremarkable HEART:  PMI not displaced or sustained,S1 and S2 within normal limits, no S3, no S4, no clicks, no rubs, 2 out of 6 brief apical systolic murmur and heard best at the left upper sternal border, no diastolic murmurs ABD:  Flat, positive bowel sounds normal in frequency in pitch, no bruits, no rebound, no guarding, no midline pulsatile mass, no hepatomegaly, no splenomegaly EXT:  2 plus pulses throughout, mild bilateral ankle edema, no cyanosis no clubbing SKIN:  No rashes no nodules NEURO:  Cranial nerves II through XII grossly intact, motor grossly intact throughout PSYCH:  Cognitively intact, oriented to person place and time    EKG:  EKG is ordered today. The ekg ordered today demonstrates sinus rhythm, rate 65, axis within normal limits, intervals within normal limits, no acute ST-T wave changes.   Recent Labs: 01/27/2020: ALT 18; BUN 17; Creatinine, Ser 1.00; Potassium 4.3; Sodium 141; TSH 0.88 03/04/2020: Hemoglobin 11.0; Platelets 94.0    Lipid Panel    Component Value Date/Time   CHOL 116 01/27/2020 1138   TRIG 92.0 01/27/2020 1138   HDL 43.80 01/27/2020 1138   CHOLHDL 3 01/27/2020 1138   VLDL 18.4 01/27/2020 1138   LDLCALC 53 01/27/2020  1138      Wt Readings from Last 3 Encounters:  03/30/20 222 lb (100.7 kg)  03/23/20 220 lb (99.8 kg)  02/08/20 (!) 218 lb (98.9 kg)      Other studies Reviewed: Additional studies/ records that were reviewed today include: Labs. Review of the above records demonstrates:  Please see elsewhere in the note.     ASSESSMENT AND PLAN:  DOE: The patient has dyspnea that could be multifactorial in part related to weight and deconditioning.  She does have a very slight  systolic murmur the previous echo was okay.  I am going to start with a BNP level and an echocardiogram.  I will also check a chest x-ray I will consider ischemia evaluation although her symptoms are somewhat atypical for this.  I do note that she has had a very slight anemia.  Her thyroid and electrolytes are unremarkable.  These of been going on the last couple of months.  HTN: Her blood pressure is elevated.  She is going to keep a blood pressure diary.  She might need further med titration.  DM: A1c is 6.5.  I will defer to Brunetta Jeans, PA-C   COVID EDUCATION: She has had her vaccine   Current medicines are reviewed at length with the patient today.  The patient does not have concerns regarding medicines.  The following changes have been made:  no change  Labs/ tests ordered today include:   Orders Placed This Encounter  Procedures  . DG Chest 2 View  . Pro b natriuretic peptide (BNP)  . EKG 12-Lead  . ECHOCARDIOGRAM COMPLETE     Disposition:   FU with me after the above testing   Signed, Minus Breeding, MD  03/30/2020 12:05 PM    [place]

## 2020-03-30 ENCOUNTER — Other Ambulatory Visit: Payer: Medicare Other | Admitting: *Deleted

## 2020-03-30 ENCOUNTER — Other Ambulatory Visit: Payer: Self-pay

## 2020-03-30 ENCOUNTER — Ambulatory Visit: Payer: Medicare Other | Admitting: Cardiology

## 2020-03-30 ENCOUNTER — Encounter: Payer: Self-pay | Admitting: Cardiology

## 2020-03-30 VITALS — BP 156/74 | HR 65 | Ht 67.0 in | Wt 222.0 lb

## 2020-03-30 DIAGNOSIS — R0602 Shortness of breath: Secondary | ICD-10-CM

## 2020-03-30 DIAGNOSIS — R0600 Dyspnea, unspecified: Secondary | ICD-10-CM

## 2020-03-30 DIAGNOSIS — R0609 Other forms of dyspnea: Secondary | ICD-10-CM

## 2020-03-30 NOTE — Patient Instructions (Signed)
Medication Instructions:  The current medical regimen is effective;  continue present plan and medications.  *If you need a refill on your cardiac medications before your next appointment, please call your pharmacy*  Lab Work: Please have blood work (Pro-BNP) If you have labs (blood work) drawn today and your tests are completely normal, you will receive your results only by: Marland Kitchen MyChart Message (if you have MyChart) OR . A paper copy in the mail If you have any lab test that is abnormal or we need to change your treatment, we will call you to review the results.  Testing/Procedures: Your physician has requested that you have an echocardiogram. Echocardiography is a painless test that uses sound waves to create images of your heart. It provides your doctor with information about the size and shape of your heart and how well your heart's chambers and valves are working. This procedure takes approximately one hour. There are no restrictions for this procedure.  A chest x-ray takes a picture of the organs and structures inside the chest, including the heart, lungs, and blood vessels. This test can show several things, including, whether the heart is enlarges; whether fluid is building up in the lungs; and whether pacemaker / defibrillator leads are still in place. [place], [place]  [place] [id number] Open - Monday - Friday 8am to 5 pm  Follow-Up: At [facility], you and your health needs are our priority.  As part of our continuing mission to provide you with exceptional heart care, we have created designated Provider Care Teams.  These Care Teams include your primary Cardiologist (physician) and Advanced Practice Providers (APPs -  Physician Assistants and Nurse Practitioners) who all work together to provide you with the care you need, when you need it.  We recommend signing up for the patient portal called "MyChart".  Sign up information is provided on this  After Visit Summary.  MyChart is used to connect with patients for Virtual Visits (Telemedicine).  Patients are able to view lab/test results, encounter notes, upcoming appointments, etc.  Non-urgent messages can be sent to your provider as well.   To learn more about what you can do with MyChart, go to [email or web address].    Follow up with Dr Percival Spanish after the above testinghas been completed.   Thank you for choosing [facility]!!

## 2020-03-31 LAB — PRO B NATRIURETIC PEPTIDE: NT-Pro BNP: 250 pg/mL (ref 0–738)

## 2020-04-01 ENCOUNTER — Ambulatory Visit: Payer: Medicare Other | Admitting: Gastroenterology

## 2020-04-08 ENCOUNTER — Ambulatory Visit
Admission: RE | Admit: 2020-04-08 | Discharge: 2020-04-08 | Disposition: A | Discharge status: Home / Self Care | Payer: Medicare Other | Source: Ambulatory Visit | Attending: Cardiology | Admitting: Cardiology

## 2020-04-08 ENCOUNTER — Other Ambulatory Visit: Payer: Medicare Other

## 2020-04-08 ENCOUNTER — Other Ambulatory Visit: Payer: Self-pay

## 2020-04-08 ENCOUNTER — Ambulatory Visit (HOSPITAL_COMMUNITY): Payer: Medicare Other | Attending: Cardiology

## 2020-04-08 DIAGNOSIS — R0600 Dyspnea, unspecified: Secondary | ICD-10-CM | POA: Diagnosis not present

## 2020-04-08 DIAGNOSIS — R0609 Other forms of dyspnea: Secondary | ICD-10-CM

## 2020-04-08 DIAGNOSIS — R0602 Shortness of breath: Secondary | ICD-10-CM | POA: Diagnosis present

## 2020-04-08 LAB — ECHOCARDIOGRAM COMPLETE
AR max vel: 1.77 cm2
AV Area VTI: 2.11 cm2
AV Area mean vel: 1.79 cm2
AV Mean grad: 11 mmHg
AV Peak grad: 18.7 mmHg
Ao pk vel: 2.17 m/s
Area-P 1/2: 2.66 cm2
S' Lateral: 3 cm

## 2020-04-21 ENCOUNTER — Telehealth: Payer: Self-pay | Admitting: Cardiology

## 2020-04-21 NOTE — Telephone Encounter (Signed)
Advised patient of results Patient denies any fever, chills, or cough.  Advised if develops contact PCP, verbalized understanding

## 2020-04-21 NOTE — Telephone Encounter (Signed)
    Pt is returning Cheryl Jordan's call for her echo result, pt said to call her later afternoon

## 2020-04-21 NOTE — Telephone Encounter (Signed)
-----   Message from Minus Breeding, MD sent at 04/11/2020  8:27 AM EDT ----- The CXR demonstrates no obvious disease although there is some linear density that could represent an early infiltrate.  He she has cough or fevers or chills she should follow with her primary provider.  Call Ms. Fonda with the results and send results to Brunetta Jeans, PA-C

## 2020-04-21 NOTE — Telephone Encounter (Signed)
-----   Message from Minus Breeding, MD sent at 04/10/2020  3:15 PM EDT ----- NL LV function.   There is some LVH and perhaps diastolic dysfunction.  However, her BNP was normal.  No change in therapy.  There is no suggestion of heart failure or valvular disease as a cause of her dyspnea.  Call [name] with the results and send results to Brunetta Jeans, PA-C

## 2020-04-27 ENCOUNTER — Telehealth (INDEPENDENT_AMBULATORY_CARE_PROVIDER_SITE_OTHER): Payer: Medicare Other | Admitting: Physician Assistant

## 2020-04-27 ENCOUNTER — Other Ambulatory Visit: Payer: Self-pay

## 2020-04-27 ENCOUNTER — Encounter: Payer: Self-pay | Admitting: Physician Assistant

## 2020-04-27 DIAGNOSIS — R197 Diarrhea, unspecified: Secondary | ICD-10-CM | POA: Diagnosis not present

## 2020-04-27 MED ORDER — LOPERAMIDE HCL 2 MG PO CAPS: ORAL_CAPSULE | ORAL | 0 refills | Status: DC

## 2020-04-27 MED ORDER — ONDANSETRON 4 MG PO TBDP: 4.0000 mg | ORAL_TABLET | Freq: Three times a day (TID) | ORAL | 0 refills | Status: DC | PRN

## 2020-04-27 NOTE — Progress Notes (Signed)
Virtual Visit via Telephone Note  I connected with Cheryl Jordan on 04/27/20 at  9:00 AM EDT by telephone and verified that I am speaking with the correct person using two identifiers.  Location: Patient: Home Provider: [facility]   I discussed the limitations, risks, security and privacy concerns of performing an evaluation and management service by telephone and the availability of in person appointments. I also discussed with the patient that there may be a patient responsible charge related to this service. The patient expressed understanding and agreed to proceed.  History of Present Illness: Patient endorses 2 days of nausea with episodes of non-bloody emesis occurring every 2-3 hours. Has noted significant diarrhea, going about every 30 minutes - hour. Notes fever and chills on Monday that have resolved.  Notes epigastric pain. Has not been able to eat anything solid. Just trying to keep hydrated. Notes significant weakness.    Observations/Objective:  No labored breathing.  Speech is clear and coherent with logical content.  Patient is alert and oriented at baseline.   Assessment and Plan: 1. Diarrhea of presumed infectious origin With non-bloody emesis, mild. No current fever. No melena or hematochezia. Mild abdominal cramping. Dehydration is the biggest concern giving amount and frequency of stool in conjunction with poor oral intake, age and comorbidity. Recommend UC/ER for assessment and IV fluids but she declines at present against recommendations. Did send in Rx immodium and zofran to help slow fluid loss as this seems viral in nature. Dietary recommendations reviewed. She promises if symptoms are not improving, anything worsens or if she can't keep fluids in to have her son take her to the ER.    Follow Up Instructions:    I discussed the assessment and treatment plan with the patient. The patient was provided an opportunity to ask questions and all were answered. The  patient agreed with the plan and demonstrated an understanding of the instructions.   The patient was advised to call back or seek an in-person evaluation if the symptoms worsen or if the condition fails to improve as anticipated.  I provided 15 minutes of non-face-to-face time during this encounter.   Leeanne Rio, PA-C

## 2020-04-27 NOTE — Progress Notes (Signed)
I have discussed the procedure for the virtual visit with the patient who has given consent to proceed with assessment and treatment.   Goldie Tregoning S Jaecion Dempster, CMA     

## 2020-04-29 ENCOUNTER — Inpatient Hospital Stay (HOSPITAL_COMMUNITY)
Admission: EM | Admit: 2020-04-29 | Discharge: 2020-05-02 | DRG: 392 | Disposition: A | Discharge status: Home / Self Care | Payer: Medicare Other | Attending: Internal Medicine | Admitting: Internal Medicine

## 2020-04-29 ENCOUNTER — Other Ambulatory Visit: Payer: Self-pay

## 2020-04-29 ENCOUNTER — Emergency Department (HOSPITAL_COMMUNITY): Payer: Medicare Other

## 2020-04-29 ENCOUNTER — Encounter (HOSPITAL_COMMUNITY): Payer: Self-pay

## 2020-04-29 DIAGNOSIS — K7460 Unspecified cirrhosis of liver: Secondary | ICD-10-CM | POA: Diagnosis present

## 2020-04-29 DIAGNOSIS — E1159 Type 2 diabetes mellitus with other circulatory complications: Secondary | ICD-10-CM

## 2020-04-29 DIAGNOSIS — Z9049 Acquired absence of other specified parts of digestive tract: Secondary | ICD-10-CM

## 2020-04-29 DIAGNOSIS — Z7984 Long term (current) use of oral hypoglycemic drugs: Secondary | ICD-10-CM

## 2020-04-29 DIAGNOSIS — N179 Acute kidney failure, unspecified: Secondary | ICD-10-CM | POA: Diagnosis present

## 2020-04-29 DIAGNOSIS — K529 Noninfective gastroenteritis and colitis, unspecified: Secondary | ICD-10-CM | POA: Diagnosis not present

## 2020-04-29 DIAGNOSIS — N1831 Chronic kidney disease, stage 3a: Secondary | ICD-10-CM | POA: Diagnosis present

## 2020-04-29 DIAGNOSIS — R932 Abnormal findings on diagnostic imaging of liver and biliary tract: Secondary | ICD-10-CM | POA: Diagnosis not present

## 2020-04-29 DIAGNOSIS — Z825 Family history of asthma and other chronic lower respiratory diseases: Secondary | ICD-10-CM

## 2020-04-29 DIAGNOSIS — F419 Anxiety disorder, unspecified: Secondary | ICD-10-CM | POA: Diagnosis present

## 2020-04-29 DIAGNOSIS — Z886 Allergy status to analgesic agent status: Secondary | ICD-10-CM

## 2020-04-29 DIAGNOSIS — R5381 Other malaise: Secondary | ICD-10-CM | POA: Diagnosis present

## 2020-04-29 DIAGNOSIS — C189 Malignant neoplasm of colon, unspecified: Secondary | ICD-10-CM | POA: Diagnosis not present

## 2020-04-29 DIAGNOSIS — A0811 Acute gastroenteropathy due to Norwalk agent: Principal | ICD-10-CM | POA: Diagnosis present

## 2020-04-29 DIAGNOSIS — Z833 Family history of diabetes mellitus: Secondary | ICD-10-CM

## 2020-04-29 DIAGNOSIS — E860 Dehydration: Secondary | ICD-10-CM | POA: Diagnosis present

## 2020-04-29 DIAGNOSIS — R112 Nausea with vomiting, unspecified: Secondary | ICD-10-CM

## 2020-04-29 DIAGNOSIS — E1122 Type 2 diabetes mellitus with diabetic chronic kidney disease: Secondary | ICD-10-CM | POA: Diagnosis present

## 2020-04-29 DIAGNOSIS — K219 Gastro-esophageal reflux disease without esophagitis: Secondary | ICD-10-CM | POA: Diagnosis present

## 2020-04-29 DIAGNOSIS — Z885 Allergy status to narcotic agent status: Secondary | ICD-10-CM

## 2020-04-29 DIAGNOSIS — N183 Chronic kidney disease, stage 3 unspecified: Secondary | ICD-10-CM | POA: Diagnosis present

## 2020-04-29 DIAGNOSIS — Z79890 Hormone replacement therapy (postmenopausal): Secondary | ICD-10-CM

## 2020-04-29 DIAGNOSIS — M109 Gout, unspecified: Secondary | ICD-10-CM | POA: Diagnosis present

## 2020-04-29 DIAGNOSIS — I152 Hypertension secondary to endocrine disorders: Secondary | ICD-10-CM

## 2020-04-29 DIAGNOSIS — Z79899 Other long term (current) drug therapy: Secondary | ICD-10-CM

## 2020-04-29 DIAGNOSIS — Z20822 Contact with and (suspected) exposure to covid-19: Secondary | ICD-10-CM | POA: Diagnosis present

## 2020-04-29 DIAGNOSIS — Z8249 Family history of ischemic heart disease and other diseases of the circulatory system: Secondary | ICD-10-CM

## 2020-04-29 DIAGNOSIS — Z85038 Personal history of other malignant neoplasm of large intestine: Secondary | ICD-10-CM

## 2020-04-29 DIAGNOSIS — M810 Age-related osteoporosis without current pathological fracture: Secondary | ICD-10-CM | POA: Diagnosis present

## 2020-04-29 DIAGNOSIS — E1169 Type 2 diabetes mellitus with other specified complication: Secondary | ICD-10-CM

## 2020-04-29 DIAGNOSIS — E039 Hypothyroidism, unspecified: Secondary | ICD-10-CM | POA: Diagnosis present

## 2020-04-29 DIAGNOSIS — Z905 Acquired absence of kidney: Secondary | ICD-10-CM

## 2020-04-29 DIAGNOSIS — Z801 Family history of malignant neoplasm of trachea, bronchus and lung: Secondary | ICD-10-CM

## 2020-04-29 DIAGNOSIS — E785 Hyperlipidemia, unspecified: Secondary | ICD-10-CM

## 2020-04-29 DIAGNOSIS — K760 Fatty (change of) liver, not elsewhere classified: Secondary | ICD-10-CM | POA: Diagnosis present

## 2020-04-29 LAB — COMPREHENSIVE METABOLIC PANEL
ALT: 20 U/L (ref 0–44)
AST: 30 U/L (ref 15–41)
Albumin: 3.1 g/dL — ABNORMAL LOW (ref 3.5–5.0)
Alkaline Phosphatase: 90 U/L (ref 38–126)
Anion gap: 9 (ref 5–15)
BUN: 25 mg/dL — ABNORMAL HIGH (ref 8–23)
CO2: 22 mmol/L (ref 22–32)
Calcium: 8.7 mg/dL — ABNORMAL LOW (ref 8.9–10.3)
Chloride: 104 mmol/L (ref 98–111)
Creatinine, Ser: 1.32 mg/dL — ABNORMAL HIGH (ref 0.44–1.00)
GFR, Estimated: 39 mL/min — ABNORMAL LOW (ref >60)
Glucose, Bld: 200 mg/dL — ABNORMAL HIGH (ref 70–99)
Potassium: 4 mmol/L (ref 3.5–5.1)
Sodium: 135 mmol/L (ref 135–145)
Total Bilirubin: 1.7 mg/dL — ABNORMAL HIGH (ref 0.3–1.2)
Total Protein: 6.2 g/dL — ABNORMAL LOW (ref 6.5–8.1)

## 2020-04-29 LAB — RESPIRATORY PANEL BY RT PCR (FLU A&B, COVID)
Influenza A by PCR: NEGATIVE
Influenza B by PCR: NEGATIVE
SARS Coronavirus 2 by RT PCR: NEGATIVE

## 2020-04-29 LAB — CBC
HCT: 34.2 % — ABNORMAL LOW (ref 36.0–46.0)
Hemoglobin: 11.2 g/dL — ABNORMAL LOW (ref 12.0–15.0)
MCH: 30.4 pg (ref 26.0–34.0)
MCHC: 32.7 g/dL (ref 30.0–36.0)
MCV: 92.7 fL (ref 80.0–100.0)
Platelets: 100 10*3/uL — ABNORMAL LOW (ref 150–400)
RBC: 3.69 MIL/uL — ABNORMAL LOW (ref 3.87–5.11)
RDW: 15.6 % — ABNORMAL HIGH (ref 11.5–15.5)
WBC: 6.8 10*3/uL (ref 4.0–10.5)
nRBC: 0 % (ref 0.0–0.2)

## 2020-04-29 LAB — URINALYSIS, ROUTINE W REFLEX MICROSCOPIC
Bilirubin Urine: NEGATIVE
Glucose, UA: NEGATIVE mg/dL
Hgb urine dipstick: NEGATIVE
Ketones, ur: NEGATIVE mg/dL
Leukocytes,Ua: 117 — AB
Nitrite: NEGATIVE
Protein, ur: NEGATIVE mg/dL
Specific Gravity, Urine: 1.011 (ref 1.005–1.030)
pH: 6 (ref 5.0–8.0)

## 2020-04-29 LAB — GLUCOSE, CAPILLARY
Glucose-Capillary: 155 mg/dL — ABNORMAL HIGH (ref 70–99)
Glucose-Capillary: 125 mg/dL — ABNORMAL HIGH (ref 70–99)

## 2020-04-29 LAB — LIPASE, BLOOD: Lipase: 95 U/L — ABNORMAL HIGH (ref 11–51)

## 2020-04-29 MED ORDER — ACETAMINOPHEN 650 MG RE SUPP: 650.0000 mg | Freq: Four times a day (QID) | RECTAL | Status: DC | PRN

## 2020-04-29 MED ORDER — INSULIN ASPART 100 UNIT/ML ~~LOC~~ SOLN
0.0000 [IU] | Freq: Three times a day (TID) | SUBCUTANEOUS | Status: DC
  Administered 2020-04-29 – 2020-04-30 (×3): 2 [IU] via SUBCUTANEOUS
  Administered 2020-04-30: 1 [IU] via SUBCUTANEOUS
  Administered 2020-05-01 – 2020-05-02 (×4): 2 [IU] via SUBCUTANEOUS

## 2020-04-29 MED ORDER — SODIUM CHLORIDE 0.9 % IV SOLN: INTRAVENOUS | Status: DC

## 2020-04-29 MED ORDER — SODIUM CHLORIDE 0.9 % IV SOLN: INTRAVENOUS | Status: AC

## 2020-04-29 MED ORDER — LEVOTHYROXINE SODIUM 88 MCG PO TABS
88.0000 ug | ORAL_TABLET | Freq: Every day | ORAL | Status: DC
  Administered 2020-04-30 – 2020-05-02 (×3): 88 ug via ORAL
  Filled 2020-04-29 (×3): qty 1

## 2020-04-29 MED ORDER — AMLODIPINE BESYLATE 5 MG PO TABS
2.5000 mg | ORAL_TABLET | Freq: Every day | ORAL | Status: DC
  Administered 2020-04-29 – 2020-05-02 (×4): 2.5 mg via ORAL
  Filled 2020-04-29 (×4): qty 1

## 2020-04-29 MED ORDER — PANTOPRAZOLE SODIUM 40 MG PO TBEC
40.0000 mg | DELAYED_RELEASE_TABLET | Freq: Every day | ORAL | Status: DC
  Administered 2020-04-29 – 2020-05-02 (×4): 40 mg via ORAL
  Filled 2020-04-29 (×4): qty 1

## 2020-04-29 MED ORDER — SODIUM CHLORIDE 0.9 % IV BOLUS
500.0000 mL | Freq: Once | INTRAVENOUS | Status: AC
  Administered 2020-04-29: 500 mL via INTRAVENOUS

## 2020-04-29 MED ORDER — METOPROLOL TARTRATE 5 MG/5ML IV SOLN
5.0000 mg | Freq: Four times a day (QID) | INTRAVENOUS | Status: DC | PRN
  Administered 2020-05-01: 22:00:00 5 mg via INTRAVENOUS
  Filled 2020-04-29: qty 5

## 2020-04-29 MED ORDER — HYDROMORPHONE HCL 1 MG/ML IJ SOLN
0.5000 mg | Freq: Once | INTRAMUSCULAR | Status: AC
  Administered 2020-04-29: 0.5 mg via INTRAVENOUS
  Filled 2020-04-29: qty 1

## 2020-04-29 MED ORDER — ATENOLOL 50 MG PO TABS
50.0000 mg | ORAL_TABLET | Freq: Every day | ORAL | Status: DC
  Administered 2020-04-29 – 2020-05-02 (×4): 50 mg via ORAL
  Filled 2020-04-29 (×4): qty 1

## 2020-04-29 MED ORDER — ENOXAPARIN SODIUM 40 MG/0.4ML ~~LOC~~ SOLN
40.0000 mg | SUBCUTANEOUS | Status: DC
  Administered 2020-04-29 – 2020-05-01 (×3): 40 mg via SUBCUTANEOUS
  Filled 2020-04-29 (×3): qty 0.4

## 2020-04-29 MED ORDER — FERROUS SULFATE 325 (65 FE) MG PO TABS
325.0000 mg | ORAL_TABLET | Freq: Every day | ORAL | Status: DC
  Administered 2020-04-30 – 2020-05-02 (×3): 325 mg via ORAL
  Filled 2020-04-29 (×3): qty 1

## 2020-04-29 MED ORDER — ACETAMINOPHEN 325 MG PO TABS
650.0000 mg | ORAL_TABLET | Freq: Four times a day (QID) | ORAL | Status: DC | PRN
  Administered 2020-04-29 – 2020-05-01 (×3): 650 mg via ORAL
  Filled 2020-04-29 (×3): qty 2

## 2020-04-29 MED ORDER — METOCLOPRAMIDE HCL 5 MG/ML IJ SOLN
2.5000 mg | Freq: Once | INTRAMUSCULAR | Status: AC
  Administered 2020-04-29: 2.5 mg via INTRAVENOUS
  Filled 2020-04-29: qty 2

## 2020-04-29 NOTE — ED Notes (Signed)
Hospitalist at bedside 

## 2020-04-29 NOTE — ED Triage Notes (Signed)
Patient states she has been having upper abdominal pain, N/V, and diarrhea x 4 days. Patient states she called her physician 3 days ago and received a prescription for nausea medicine, but nausea returned last night with vomiting and feeling weak.

## 2020-04-29 NOTE — H&P (Signed)
History and Physical        Hospital Admission Note Date: 04/29/2020  Patient name: Cheryl Jordan record number: [id number] Date of birth: 1941/02/15 Age: 79 y.o. Gender: female  PCP: Brunetta Jeans, PA-C  Patient coming from: Home Lives with: Husband At baseline, ambulates: Geneticist, molecular Complaint  Patient presents with  . Abdominal Pain  . Emesis  . Diarrhea      HPI:   This is a 79 year old female who has been vaccinated against COVID-19 with a history of type 2 diabetes, colon cancer, GERD, hypertension, hypothyroidism, cholecystectomy who presented to the ED with several days of epigastric abdominal pain and distention with nonbilious, nonbloody emesis which started on Monday along with watery diarrhea.  No recent travel history no sick contacts although her husband was recently admitted for aspiration pneumonia.  She was seen by her PCP and was given Zofran and Imodium but had minimal improvement in her symptoms.  No recent antibiotic use or change in medications otherwise and has generalized malaise.  Also with poor p.o. intake.  ED Course: Afebrile and hemodynamically stable on room air.  Notable labs: Glucose 200, BUN 25, creatinine 1.3 (baseline 0.9-1.0), T bili 1.7, AST and ALT unremarkable, Hb 11.2, platelets 100 (at baseline), lipase 95.  CT abdomen pelvis without contrast: Findings consistent with hepatocellular carcinoma, cirrhosis and mild splenomegaly as well as Berns thickening of the distal duodenum concerning for enteritis or PUD.  ED physician had discussed the CT findings with the patient and son who were aware of the concerning findings for [facility] and reported her oncologist is aware.  She was given IV fluids in the ED.   Vitals:   04/29/20 1500 04/29/20 1539  BP: (!) 159/61 (!) 168/64  Pulse: 63 61  Resp: 20 16  Temp:  98.2 F (36.8  C)  SpO2: 93% 94%     Review of Systems:  Review of Systems  Constitutional: Positive for malaise/fatigue. Negative for chills and fever.  Respiratory: Negative for shortness of breath.   Cardiovascular: Negative for chest pain and palpitations.  Gastrointestinal: Positive for abdominal pain, diarrhea, nausea and vomiting. Negative for blood in stool.  Genitourinary: Negative for dysuria.  All other systems reviewed and are negative.   Medical/Social/Family History   Past Medical History: Past Medical History:  Diagnosis Date  . Allergy   . Anemia    hx of  . Anxiety   . Arthritis   . Colon cancer ([place]) dx'd 11/2014  . Diabetes mellitus without complication ([place])    TYPE II  . Fatty liver   . Gallbladder disease   . GERD (gastroesophageal reflux disease)   . History of gout   . Hypertension   . Hypothyroidism   . Kidney disorder    spot on left kidney  . Osteoporosis   . PONV (postoperative nausea and vomiting)     Past Surgical History:  Procedure Laterality Date  . CHOLECYSTECTOMY    . IR RADIOLOGIST EVAL & MGMT  01/24/2017  . KNEE CARTILAGE SURGERY Bilateral   . NEPHRECTOMY Left 02/10/2015   Procedure: OPEN RETROPERINTONEAL EXPLORATION LEFT RENAL  CYST DECORTICATION X 5;  Surgeon: Saralyn Pilar  Alita Chyle, MD;  Location: [facility];  Service: Urology;  Laterality: Left;  . PARTIAL COLECTOMY N/A 02/10/2015   Procedure: OPEN RIGHT  COLECTOMY ;  Surgeon: [name], MD;  Location: [facility];  Service: General;  Laterality: N/A;  . TUBAL LIGATION      Medications: Prior to Admission medications   Medication Sig Start Date End Date Taking? Authorizing Provider  acetaminophen (TYLENOL) 500 MG tablet Take 500 mg by mouth every 6 (six) hours as needed for moderate pain.   Yes [provider]  amLODipine (NORVASC) 2.5 MG tablet Take 1 tablet (2.5 mg total) by mouth daily. 12/04/19  Yes Brunetta Jeans, PA-C  atenolol (TENORMIN) 50 MG tablet TAKE 1 TABLET BY MOUTH IN  THE  MORNING Patient taking differently: Take 50 mg by mouth daily.  02/03/20  Yes Brunetta Jeans, PA-C  calcium citrate-vitamin D (CITRACAL+D) 315-200 MG-UNIT tablet Take 1 tablet by mouth daily.    Yes [provider]  ferrous sulfate 325 (65 FE) MG EC tablet Take 1 tablet (325 mg total) by mouth 2 (two) times daily with a meal. Patient taking differently: Take 325 mg by mouth daily.  02/02/20  Yes Brunetta Jeans, PA-C  glipiZIDE (GLUCOTROL XL) 5 MG 24 hr tablet TAKE (1) TABLET DAILY WITH BREAKFAST. Patient taking differently: Take 5 mg by mouth daily.  12/03/19  Yes Brunetta Jeans, PA-C  levothyroxine (SYNTHROID) 88 MCG tablet Take 1 tablet (88 mcg total) by mouth daily. 12/04/19  Yes Brunetta Jeans, PA-C  loperamide (IMODIUM) 2 MG capsule Take 2 capsules by mouth once. Then take additional 2mg  capsule as needed for continued diarrhea. No more than 16 mg in 24 hours. Patient taking differently: Take 2 mg by mouth as needed for diarrhea or loose stools. Take 2 capsules by mouth once. Then take additional 2mg  capsule as needed for continued diarrhea. No more than 16 mg in 24 hours. 04/27/20  Yes Brunetta Jeans, PA-C  LORazepam (ATIVAN) 0.5 MG tablet Take 0.5 mg by mouth every 8 (eight) hours as needed for anxiety. for anxiety 12/04/16  Yes [provider]  omeprazole (PRILOSEC) 20 MG capsule Take 1 capsule (20 mg total) by mouth daily. 12/04/19  Yes Brunetta Jeans, PA-C  ondansetron (ZOFRAN ODT) 4 MG disintegrating tablet Take 1 tablet (4 mg total) by mouth every 8 (eight) hours as needed for nausea or vomiting. 04/27/20  Yes Brunetta Jeans, PA-C  telmisartan (MICARDIS) 40 MG tablet Take 1 tablet (40 mg total) by mouth daily. Patient taking differently: Take 20 mg by mouth in the morning and at bedtime.  03/01/20  Yes Brunetta Jeans, PA-C  ACCU-CHEK AVIVA PLUS test strip  10/20/18   [provider]  Blood Glucose Monitoring Suppl (ACCU-CHEK AVIVA) device Use as  instructed 07/03/19 07/02/20  Brunetta Jeans, PA-C    Allergies:   Allergies  Allergen Reactions  . Aspirin Other (See Comments)    Runny nose   . Codeine Nausea And Vomiting    Social History:  reports that she has never smoked. She has never used smokeless tobacco. She reports that she does not drink alcohol and does not use drugs.  Family History: Family History  Problem Relation Age of Onset  . Diabetes Mother   . Hypertension Other   . COPD Father   . Hypertension Sister   . GER disease Sister   . Heart attack Brother   . COPD Brother   .  Lung cancer Brother   . COPD Brother   . Hypertension Brother      Objective   Physical Exam: Blood pressure (!) 168/64, pulse 61, temperature 98.2 F (36.8 C), temperature source Oral, resp. rate 16, height 5\' 7"  (1.702 m), weight 98.9 kg, SpO2 94 %.  Physical Exam Vitals and nursing note reviewed.  Constitutional:      Appearance: Normal appearance.  HENT:     Head: Normocephalic and atraumatic.  Eyes:     Conjunctiva/sclera: Conjunctivae normal.  Cardiovascular:     Rate and Rhythm: Normal rate and regular rhythm.  Pulmonary:     Effort: Pulmonary effort is normal.     Breath sounds: Normal breath sounds.  Abdominal:     General: Abdomen is flat.     Palpations: Abdomen is soft.     Tenderness: There is generalized abdominal tenderness.  Musculoskeletal:        General: No swelling or tenderness.  Skin:    Coloration: Skin is not jaundiced or pale.  Neurological:     Mental Status: She is alert. Mental status is at baseline.  Psychiatric:        Mood and Affect: Mood normal.        Behavior: Behavior normal.     LABS on Admission: I have personally reviewed all the labs and imaging below    Basic Metabolic Panel: Recent Labs  Lab 04/29/20 1006  NA 135  K 4.0  CL 104  CO2 22  GLUCOSE 200*  BUN 25*  CREATININE 1.32*  CALCIUM 8.7*   Liver Function Tests: Recent Labs  Lab 04/29/20 1006  AST  30  ALT 20  ALKPHOS 90  BILITOT 1.7*  PROT 6.2*  ALBUMIN 3.1*   Recent Labs  Lab 04/29/20 1006  LIPASE 95*   No results for input(s): AMMONIA in the last 168 hours. CBC: Recent Labs  Lab 04/29/20 1006  WBC 6.8  HGB 11.2*  HCT 34.2*  MCV 92.7  PLT 100*   Cardiac Enzymes: No results for input(s): CKTOTAL, CKMB, CKMBINDEX, TROPONINI in the last 168 hours. BNP: Invalid input(s): POCBNP CBG: Recent Labs  Lab 04/29/20 1700  GLUCAP 155*    Radiological Exams on Admission:  CT Abdomen Pelvis Wo Contrast  Result Date: 04/29/2020 CLINICAL DATA:  Acute upper abdominal pain and distention. EXAM: CT ABDOMEN AND PELVIS WITHOUT CONTRAST TECHNIQUE: Multidetector CT imaging of the abdomen and pelvis was performed following the standard protocol without IV contrast. COMPARISON:  February 06, 2016.  Dec 06, 2016. FINDINGS: Lower chest: Small left pleural effusion is noted. Mild bibasilar subsegmental atelectasis is noted. Hepatobiliary: Status post cholecystectomy. No biliary dilatation is noted. Nodular hepatic margins are noted consistent with hepatic cirrhosis. Mild amount of perihepatic ascites is noted. 2.8 x 2.5 cm complex hyperdense abnormality is noted in right hepatic lobe posteriorly which is enlarged compared to prior exam and concerning for hepatocellular carcinoma. Pancreas: Unremarkable. No pancreatic ductal dilatation or surrounding inflammatory changes. Spleen: Mild splenomegaly is noted with mild amount of perisplenic fluid. Adrenals/Urinary Tract: Adrenal glands appear normal. Stable bilateral renal cysts are noted. No hydronephrosis or renal obstruction is noted. No renal or ureteral calculi are noted. Urinary bladder is unremarkable. Stomach/Bowel: Stomach is unremarkable. Status post right hemicolectomy. There is no evidence of bowel obstruction. Lybeck thickening and mild surrounding fluid is noted around the distal duodenum concerning for possible enteritis or peptic ulcer  disease. Sigmoid diverticulosis is noted without inflammation. Vascular/Lymphatic: Aortic atherosclerosis. No enlarged abdominal  or pelvic lymph nodes. Reproductive: Uterus and bilateral adnexa are unremarkable. Other: Mild amount of ascites is noted in the pelvis and both pericolic gutters. No hernia is noted. Musculoskeletal: No acute or significant osseous findings. IMPRESSION: 1. 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 2. 2.8 x 2.5 cm complex hyperdense abnormality is noted in right hepatic lobe posteriorly which is enlarged compared to prior exam and concerning for hepatocellular carcinoma. Further evaluation with MRI with and without gadolinium administration is recommended. 3. Norkus thickening of distal duodenum is noted with a mild amount of surrounding fluid concerning for enteritis or peptic ulcer disease. 4. Small left pleural effusion is noted with mild bibasilar subsegmental atelectasis. 5. Sigmoid diverticulosis without inflammation. 6. Aortic atherosclerosis. Aortic Atherosclerosis (ICD10-I70.0). Electronically Signed   By: Marijo Conception M.D.   On: 04/29/2020 11:08      EKG: Independently reviewed.    A & P   Principal Problem:   Enteritis Active Problems:   Hypothyroidism   Adenocarcinoma of colon (HCC)   Chronic kidney disease (CKD), stage III (moderate) (HCC)   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[place])   Hypertension associated with diabetes ([place])   Abnormal finding on imaging of liver   AKI (acute kidney injury) ([place])   1. Duodenal enteritis a. Afebrile and hemodynamically stable b. Check stool studies and hold off on Imodium for now c. Continue IV fluids d. Advance diet as tolerated  2. Abdominal pain  nausea and vomiting  a. Secondary to above with mild pancreatitis b. Elevated lipase: 95 c. Continue IV fluids  3. AKI on CKD 3 a a. Multifactorial: ARB, poor p.o. intake and GI losses b. IV fluids c. Hold  ARB  4. Hypertension a. Continue amlodipine and atenolol b. Hold telmisartan  5. Diabetes a. Reduce long-acting dose of insulin with sliding scale b. Hold glipizide  6. History of colon cancer with hepatic mass concerning for [place] a. Known by patient that her oncologist per ED physician b. Could consider further outpatient follow-up versus MRI  7. Hypothyroidism a. Continue home meds   DVT prophylaxis: Lovenox   Code Status: Partial Code  Diet: Clear liquid diet Family Communication: Admission, patients condition and plan of care including tests being ordered have been discussed with the patient who indicates understanding and agrees with the plan and Code Status. Patient's son was updated  Disposition Plan: The appropriate patient status for this patient is OBSERVATION. Observation status is judged to be reasonable and necessary in order to provide the required intensity of service to ensure the patient's safety. The patient's presenting symptoms, physical exam findings, and initial radiographic and laboratory data in the context of their medical condition is felt to place them at decreased risk for further clinical deterioration. Furthermore, it is anticipated that the patient will be medically stable for discharge from the hospital within 2 midnights of admission. The following factors support the patient status of observation.   " The patient's presenting symptoms include Abdominal pain, nausea and vomiting . " The physical exam findings include Abdominal discomfort . " The initial radiographic and laboratory data are AKI, enteritis .    Status is: Observation  The patient remains OBS appropriate and will d/c before 2 midnights.  Dispo: The patient is from: Home              Anticipated d/c is to: Home              Anticipated  d/c date is: 1 day              Patient currently is not medically stable to d/c.      Consultants  . None   Procedures  . None  Time Spent  on Admission: 66 minutes    [name], DO [facility]  04/29/2020, 5:43 PM

## 2020-04-29 NOTE — ED Notes (Signed)
Patient placed on purewick to obtain urine sample. Getting fluids at this time.

## 2020-04-29 NOTE — ED Provider Notes (Addendum)
[facility] DEPT Provider Note   CSN: [id number] Arrival date & time: 04/29/20  0935     History Chief Complaint  Patient presents with  . Abdominal Pain  . Emesis  . Diarrhea    Cheryl Jordan is a 79 y.o. female.  79 year old female presents with several days of abdominal distention as well as nonbilious emesis.  She also endorses watery diarrhea.  Denies any recent travel history.  Called her doctor yesterday for similar symptoms and was prescribed Zofran which is helped her emesis.  Abdominal pain is been epigastric.  She does have a prior history of cholecystectomy.  Denies any urinary symptoms.  No anginal type symptoms.  Denies any recent antibiotic use.  Now endorses increased weakness which is diffuse in nature.  Patient has completed her Covid vaccination series        Past Medical History:  Diagnosis Date  . Allergy   . Anemia    hx of  . Anxiety   . Arthritis   . Colon cancer ([place]) dx'd 11/2014  . Diabetes mellitus without complication ([place])    TYPE II  . Fatty liver   . Gallbladder disease   . GERD (gastroesophageal reflux disease)   . History of gout   . Hypertension   . Hypothyroidism   . Kidney disorder    spot on left kidney  . Osteoporosis   . PONV (postoperative nausea and vomiting)     Patient Active Problem List   Diagnosis Date Noted  . Abnormal finding on imaging of liver 02/01/2019  . Chronic kidney disease (CKD), stage III (moderate) ([place]) 12/09/2018  . Malignant neoplasm of colon ([place]) 08/11/2015  . 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[place]) 08/11/2015  . Adenocarcinoma of colon ([place]) 01/05/2015  . Hypertension associated with diabetes ([place]) 12/17/2013  . Arthritis 12/17/2013  . Hypothyroidism 10/16/2011  . GERD (gastroesophageal reflux disease) 10/16/2011    Past Surgical History:  Procedure Laterality Date  . CHOLECYSTECTOMY    . IR RADIOLOGIST EVAL & MGMT  01/24/2017  . KNEE CARTILAGE SURGERY  Bilateral   . NEPHRECTOMY Left 02/10/2015   Procedure: OPEN RETROPERINTONEAL EXPLORATION LEFT RENAL  CYST DECORTICATION X 5;  Surgeon: Cleon Gustin, MD;  [facility];  Service: Urology;  Laterality: Left;  . PARTIAL COLECTOMY N/A 02/10/2015   Procedure: OPEN RIGHT  COLECTOMY ;  Surgeon: [name], MD;  Location: [facility];  Service: General;  Laterality: N/A;  . TUBAL LIGATION       OB History   No obstetric history on file.     Family History  Problem Relation Age of Onset  . Diabetes Mother   . Hypertension Other   . COPD Father   . Hypertension Sister   . GER disease Sister   . Heart attack Brother   . COPD Brother   . Lung cancer Brother   . COPD Brother   . Hypertension Brother     Social History   Tobacco Use  . Smoking status: Never Smoker  . Smokeless tobacco: Never Used  Vaping Use  . Vaping Use: Never used  Substance Use Topics  . Alcohol use: No  . Drug use: No    Home Medications Prior to Admission medications   Medication Sig Start Date End Date Taking? Authorizing Provider  ACCU-CHEK AVIVA PLUS test strip  10/20/18   [provider]  amLODipine (NORVASC) 2.5 MG tablet Take 1 tablet (2.5 mg total) by mouth daily.  12/04/19   Brunetta Jeans, PA-C  atenolol (TENORMIN) 50 MG tablet TAKE 1 TABLET BY MOUTH IN  THE MORNING 02/03/20   Brunetta Jeans, PA-C  Blood Glucose Monitoring Suppl (ACCU-CHEK AVIVA) device Use as instructed 07/03/19 07/02/20  Brunetta Jeans, PA-C  calcium citrate-vitamin D (CITRACAL+D) 315-200 MG-UNIT tablet Take 1 tablet by mouth 2 (two) times daily.    [provider]  ferrous sulfate 325 (65 FE) MG EC tablet Take 1 tablet (325 mg total) by mouth 2 (two) times daily with a meal. 02/02/20   Brunetta Jeans, PA-C  glipiZIDE (GLUCOTROL XL) 5 MG 24 hr tablet TAKE (1) TABLET DAILY WITH BREAKFAST. 12/03/19   Brunetta Jeans, PA-C  levothyroxine (SYNTHROID) 88 MCG tablet Take 1 tablet (88 mcg total) by mouth  daily. 12/04/19   Brunetta Jeans, PA-C  loperamide (IMODIUM) 2 MG capsule Take 2 capsules by mouth once. Then take additional 2mg  capsule as needed for continued diarrhea. No more than 16 mg in 24 hours. 04/27/20   Brunetta Jeans, PA-C  LORazepam (ATIVAN) 0.5 MG tablet Take 0.5 mg by mouth every 8 (eight) hours as needed. for anxiety 12/04/16   [provider]  omeprazole (PRILOSEC) 20 MG capsule Take 1 capsule (20 mg total) by mouth daily. 12/04/19   Brunetta Jeans, PA-C  ondansetron (ZOFRAN ODT) 4 MG disintegrating tablet Take 1 tablet (4 mg total) by mouth every 8 (eight) hours as needed for nausea or vomiting. 04/27/20   Brunetta Jeans, PA-C  telmisartan (MICARDIS) 40 MG tablet Take 1 tablet (40 mg total) by mouth daily. 03/01/20   Brunetta Jeans, PA-C    Allergies    Aspirin and Codeine  Review of Systems   Review of Systems  All other systems reviewed and are negative.   Physical Exam Updated Vital Signs BP (!) 155/60 (BP Location: Right Arm)   Pulse 62   Temp 98.8 F (37.1 C) (Oral)   Resp (!) 21   Ht 1.702 m (5\' 7" )   Wt 98.9 kg   SpO2 95%   BMI 34.14 kg/m   Physical Exam Vitals and nursing note reviewed.  Constitutional:      General: She is not in acute distress.    Appearance: Normal appearance. She is well-developed. She is not toxic-appearing.  HENT:     Head: Normocephalic and atraumatic.  Eyes:     General: Lids are normal.     Conjunctiva/sclera: Conjunctivae normal.     Pupils: Pupils are equal, round, and reactive to light.  Neck:     Thyroid: No thyroid mass.     Trachea: No tracheal deviation.  Cardiovascular:     Rate and Rhythm: Normal rate and regular rhythm.     Heart sounds: Normal heart sounds. No murmur heard.  No gallop.   Pulmonary:     Effort: Pulmonary effort is normal. No respiratory distress.     Breath sounds: Normal breath sounds. No stridor. No decreased breath sounds, wheezing, rhonchi or rales.  Abdominal:      General: Bowel sounds are normal. There is distension.     Palpations: Abdomen is soft.     Tenderness: There is abdominal tenderness in the epigastric area. There is guarding. There is no rebound.    Musculoskeletal:        General: No tenderness. Normal range of motion.     Cervical back: Normal range of motion and neck supple.  Skin:  General: Skin is warm and dry.     Findings: No abrasion or rash.  Neurological:     Mental Status: She is alert and oriented to person, place, and time.     GCS: GCS eye subscore is 4. GCS verbal subscore is 5. GCS motor subscore is 6.     Cranial Nerves: No cranial nerve deficit.     Sensory: No sensory deficit.  Psychiatric:        Speech: Speech normal.        Behavior: Behavior normal.     ED Results / Procedures / Treatments   Labs (all labs ordered are listed, but only abnormal results are displayed) Labs Reviewed  LIPASE, BLOOD  COMPREHENSIVE METABOLIC PANEL  CBC  URINALYSIS, ROUTINE W REFLEX MICROSCOPIC    EKG None  Radiology No results found.  Procedures Procedures (including critical care time)  Medications Ordered in ED Medications  0.9 %  sodium chloride infusion (has no administration in time range)  sodium chloride 0.9 % bolus 500 mL (has no administration in time range)  HYDROmorphone (DILAUDID) injection 0.5 mg (has no administration in time range)  metoCLOPramide (REGLAN) injection 2.5 mg (has no administration in time range)    ED Course  I have reviewed the triage vital signs and the nursing notes.  Pertinent labs & imaging results that were available during my care of the patient were reviewed by me and considered in my medical decision making (see chart for details).    MDM Rules/Calculators/A&P                         Patient given IV fluids here along with antiemetics.  Continues to note not feeling well.  Labs consistent with dehydration.  Covid test negative.  Mild elevated lipase noted as well 2.   Liver mass noted on abdominal CT along with a mild enteritis and this was discussed at length with patient and her son who states that this is known to her oncologist Who has been following this.  Despite hydration patient continues to feel weak and lethargic.  Will consult hospitalist for overnight admission for observation Final Clinical Impression(s) / ED Diagnoses Final diagnoses:  None    Rx / DC Orders ED Discharge Orders    None       Lacretia Leigh, MD 04/29/20 1326    Lacretia Leigh, MD 04/29/20 1327

## 2020-04-30 DIAGNOSIS — K7460 Unspecified cirrhosis of liver: Secondary | ICD-10-CM | POA: Diagnosis present

## 2020-04-30 DIAGNOSIS — E860 Dehydration: Secondary | ICD-10-CM | POA: Diagnosis present

## 2020-04-30 DIAGNOSIS — A0811 Acute gastroenteropathy due to Norwalk agent: Secondary | ICD-10-CM | POA: Diagnosis present

## 2020-04-30 DIAGNOSIS — Z85038 Personal history of other malignant neoplasm of large intestine: Secondary | ICD-10-CM | POA: Diagnosis not present

## 2020-04-30 DIAGNOSIS — K529 Noninfective gastroenteritis and colitis, unspecified: Secondary | ICD-10-CM | POA: Diagnosis not present

## 2020-04-30 DIAGNOSIS — Z8249 Family history of ischemic heart disease and other diseases of the circulatory system: Secondary | ICD-10-CM | POA: Diagnosis not present

## 2020-04-30 DIAGNOSIS — Z7984 Long term (current) use of oral hypoglycemic drugs: Secondary | ICD-10-CM | POA: Diagnosis not present

## 2020-04-30 DIAGNOSIS — N179 Acute kidney failure, unspecified: Secondary | ICD-10-CM | POA: Diagnosis present

## 2020-04-30 DIAGNOSIS — K760 Fatty (change of) liver, not elsewhere classified: Secondary | ICD-10-CM | POA: Diagnosis present

## 2020-04-30 DIAGNOSIS — M810 Age-related osteoporosis without current pathological fracture: Secondary | ICD-10-CM | POA: Diagnosis present

## 2020-04-30 DIAGNOSIS — E1122 Type 2 diabetes mellitus with diabetic chronic kidney disease: Secondary | ICD-10-CM | POA: Diagnosis present

## 2020-04-30 DIAGNOSIS — Z79899 Other long term (current) drug therapy: Secondary | ICD-10-CM | POA: Diagnosis not present

## 2020-04-30 DIAGNOSIS — Z825 Family history of asthma and other chronic lower respiratory diseases: Secondary | ICD-10-CM | POA: Diagnosis not present

## 2020-04-30 DIAGNOSIS — Z885 Allergy status to narcotic agent status: Secondary | ICD-10-CM | POA: Diagnosis not present

## 2020-04-30 DIAGNOSIS — Z833 Family history of diabetes mellitus: Secondary | ICD-10-CM | POA: Diagnosis not present

## 2020-04-30 DIAGNOSIS — R5381 Other malaise: Secondary | ICD-10-CM | POA: Diagnosis present

## 2020-04-30 DIAGNOSIS — Z905 Acquired absence of kidney: Secondary | ICD-10-CM | POA: Diagnosis not present

## 2020-04-30 DIAGNOSIS — E039 Hypothyroidism, unspecified: Secondary | ICD-10-CM | POA: Diagnosis present

## 2020-04-30 DIAGNOSIS — Z886 Allergy status to analgesic agent status: Secondary | ICD-10-CM | POA: Diagnosis not present

## 2020-04-30 DIAGNOSIS — Z20822 Contact with and (suspected) exposure to covid-19: Secondary | ICD-10-CM | POA: Diagnosis present

## 2020-04-30 DIAGNOSIS — Z801 Family history of malignant neoplasm of trachea, bronchus and lung: Secondary | ICD-10-CM | POA: Diagnosis not present

## 2020-04-30 DIAGNOSIS — K219 Gastro-esophageal reflux disease without esophagitis: Secondary | ICD-10-CM | POA: Diagnosis present

## 2020-04-30 DIAGNOSIS — Z9049 Acquired absence of other specified parts of digestive tract: Secondary | ICD-10-CM | POA: Diagnosis not present

## 2020-04-30 DIAGNOSIS — Z79890 Hormone replacement therapy (postmenopausal): Secondary | ICD-10-CM | POA: Diagnosis not present

## 2020-04-30 DIAGNOSIS — M109 Gout, unspecified: Secondary | ICD-10-CM | POA: Diagnosis present

## 2020-04-30 LAB — CBC
HCT: 30.8 % — ABNORMAL LOW (ref 36.0–46.0)
Hemoglobin: 9.9 g/dL — ABNORMAL LOW (ref 12.0–15.0)
MCH: 29.7 pg (ref 26.0–34.0)
MCHC: 32.1 g/dL (ref 30.0–36.0)
MCV: 92.5 fL (ref 80.0–100.0)
Platelets: 83 10*3/uL — ABNORMAL LOW (ref 150–400)
RBC: 3.33 MIL/uL — ABNORMAL LOW (ref 3.87–5.11)
RDW: 15.2 % (ref 11.5–15.5)
WBC: 5.3 10*3/uL (ref 4.0–10.5)
nRBC: 0 % (ref 0.0–0.2)

## 2020-04-30 LAB — COMPREHENSIVE METABOLIC PANEL
ALT: 17 U/L (ref 0–44)
AST: 24 U/L (ref 15–41)
Albumin: 2.5 g/dL — ABNORMAL LOW (ref 3.5–5.0)
Alkaline Phosphatase: 72 U/L (ref 38–126)
Anion gap: 9 (ref 5–15)
BUN: 18 mg/dL (ref 8–23)
CO2: 19 mmol/L — ABNORMAL LOW (ref 22–32)
Calcium: 7.9 mg/dL — ABNORMAL LOW (ref 8.9–10.3)
Chloride: 107 mmol/L (ref 98–111)
Creatinine, Ser: 0.99 mg/dL (ref 0.44–1.00)
GFR, Estimated: 55 mL/min — ABNORMAL LOW (ref >60)
Glucose, Bld: 164 mg/dL — ABNORMAL HIGH (ref 70–99)
Potassium: 4 mmol/L (ref 3.5–5.1)
Sodium: 135 mmol/L (ref 135–145)
Total Bilirubin: 1.5 mg/dL — ABNORMAL HIGH (ref 0.3–1.2)
Total Protein: 5.2 g/dL — ABNORMAL LOW (ref 6.5–8.1)

## 2020-04-30 LAB — HEMOGLOBIN A1C
Hgb A1c MFr Bld: 6.2 % — ABNORMAL HIGH (ref 4.8–5.6)
Mean Plasma Glucose: 131 mg/dL

## 2020-04-30 LAB — GLUCOSE, CAPILLARY
Glucose-Capillary: 153 mg/dL — ABNORMAL HIGH (ref 70–99)
Glucose-Capillary: 178 mg/dL — ABNORMAL HIGH (ref 70–99)
Glucose-Capillary: 132 mg/dL — ABNORMAL HIGH (ref 70–99)
Glucose-Capillary: 158 mg/dL — ABNORMAL HIGH (ref 70–99)

## 2020-04-30 MED ORDER — DEXTROSE-NACL 5-0.45 % IV SOLN: INTRAVENOUS | Status: DC

## 2020-04-30 NOTE — Plan of Care (Signed)

## 2020-04-30 NOTE — Progress Notes (Addendum)
PROGRESS NOTE  Cheryl Jordan UVO:[id number] DOB: [birth year] DOA: 04/29/2020 PCP: Brunetta Jeans, PA-C  HPI/Recap of past 38 hours: 79 year old female admitted April 29, 2020 with several days of epigastric abdominal pain and distention with nonbilious nonbloody intermittent emesis she was diagnosed with enteritis.. April 30, 2020: Patient seen and examined at bedside She is complaining of nausea and not able to eat anything else.  She still has some diarrhea stool mixed with formed stool and her C. difficile could not be sent because of the mixture.  May 01, 2020: Patient seen and examined at bedside she feels a little better today she still feeling nauseous.  She says she could not eat the broth chicken broth.  She is requesting for soft diet she has not had any bowel movement today or since yesterday.  Complaining of gas passing gas denies abdominal bloating however.  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  Debility most likely due to the nausea vomiting and dehydration.  Patient has not resumed full oral intake  3.  Poor oral intake we will restart her IV fluid and monitor her electrolytes  4.  Hypertension.  Continue current medication  5.  Acute kidney injury due to dehydration improving  6.  Enteritis.  Continue contact precaution.  Continue IV hydration  7.  Her GI cultures are showing norovirus.  Contact isolation will be continued   Severity of Illness: The appropriate patient status for this patient is inpatient.  Inpatient status is judged to be reasonable and  necessary in order to provide the required intensity of service to ensure the patient's safety. The patient's presenting symptoms, physical exam findings, and initial radiographic and laboratory data in the context of their medical condition is felt to place them at decreased risk for further clinical deterioration. Furthermore, it is anticipated that the patient will be medically stable for discharge from the hospital within 2 midnights of admission. The following factors support the patient status of observation.   " The patient's presenting symptoms include nausea.  Diarrhea.  And inadequate oral intake..  The patient's oral intake of fluids is not sufficient to her needs as she is still on clear liquid and hardly drinking that because of nausea and still has ongoing fluid losses due to diarrhea, we need to keep in the hospital to give IVF and monitor her diarrhea and electrolytes  " The physical exam findings include generalized weakness and poor capillary refill " The initial radiographic and laboratory data are low protein     Family Communication: None at bedside  Disposition Plan: Change to inpatient, discharge 1 to 2 days after fluid hydration   Consultants:  None  Procedures:  None  Antimicrobials:  None   DVT prophylaxis: Lovenox   Diet: Clear liquid diet and advance as tolerated  Code Status: partial   Objective: Vitals:   04/29/20 2128 04/30/20 0144 04/30/20 0507 04/30/20 0846  BP: (!) 158/58 (!) 172/64 (!) 153/54 (!) 142/57  Pulse: 61 71 67 64  Resp: 20 18 (!) 24 16  Temp: 98.1 F (36.7 C) 98.6 F (37  C) 98.9 F (37.2 C) 98.5 F (36.9 C)  TempSrc: Oral Oral Oral Oral  SpO2: 92% 92% 92% 96%  Weight:      Height:        Intake/Output Summary (Last 24 hours) at 04/30/2020 1017 Last data filed at 04/30/2020 0000 Gross per 24 hour  Intake 2856.11 ml  Output --  Net 2856.11 ml   Filed Weights   04/29/20 0947  Weight: 98.9 kg   Body mass index is  34.14 kg/m.  Exam:  . General: 79 y.o. year-old female well developed well nourished in no acute distress.  Alert and oriented x3. . Cardiovascular: Regular rate and rhythm with no rubs or gallops.  No thyromegaly or JVD noted.   Marland Kitchen Respiratory: Clear to auscultation with no wheezes or rales. Good inspiratory effort. . Abdomen: Soft nontender slightly distended with normal bowel sounds x4 quadrants. . Musculoskeletal: No lower extremity edema. 2/4 pulses in all 4 extremities. . Skin: No ulcerative lesions noted or rashes, . Psychiatry: Mood is appropriate for condition and setting    Data Reviewed: CBC: Recent Labs  Lab 04/29/20 1006 04/30/20 0406  WBC 6.8 5.3  HGB 11.2* 9.9*  HCT 34.2* 30.8*  MCV 92.7 92.5  PLT 100* 83*   Basic Metabolic Panel: Recent Labs  Lab 04/29/20 1006 04/30/20 0406  NA 135 135  K 4.0 4.0  CL 104 107  CO2 22 19*  GLUCOSE 200* 164*  BUN 25* 18  CREATININE 1.32* 0.99  CALCIUM 8.7* 7.9*   GFR: Estimated Creatinine Clearance: 56.6 mL/min (by C-G formula based on SCr of 0.99 mg/dL). Liver Function Tests: Recent Labs  Lab 04/29/20 1006 04/30/20 0406  AST 30 24  ALT 20 17  ALKPHOS 90 72  BILITOT 1.7* 1.5*  PROT 6.2* 5.2*  ALBUMIN 3.1* 2.5*   Recent Labs  Lab 04/29/20 1006  LIPASE 95*   No results for input(s): AMMONIA in the last 168 hours. Coagulation Profile: No results for input(s): INR, PROTIME in the last 168 hours. Cardiac Enzymes: No results for input(s): CKTOTAL, CKMB, CKMBINDEX, TROPONINI in the last 168 hours. BNP (last 3 results) Recent Labs    03/30/20 1507  PROBNP 250   HbA1C: Recent Labs    04/29/20 1006  HGBA1C 6.2*   CBG: Recent Labs  Lab 04/29/20 1700 04/29/20 2130 04/30/20 0746  GLUCAP 155* 125* 153*   Lipid Profile: No results for input(s): CHOL, HDL, LDLCALC, TRIG, CHOLHDL, LDLDIRECT in the last 72 hours. Thyroid Function Tests: No results for input(s): TSH, T4TOTAL, FREET4, T3FREE, THYROIDAB  in the last 72 hours. Anemia Panel: No results for input(s): VITAMINB12, FOLATE, FERRITIN, TIBC, IRON, RETICCTPCT in the last 72 hours. Urine analysis:    Component Value Date/Time   COLORURINE YELLOW 04/29/2020 2000   APPEARANCEUR CLEAR 04/29/2020 2000   LABSPEC 1.011 04/29/2020 2000   PHURINE 6.0 04/29/2020 2000   GLUCOSEU NEGATIVE 04/29/2020 2000   HGBUR NEGATIVE 04/29/2020 2000   BILIRUBINUR NEGATIVE 04/29/2020 2000   BILIRUBINUR Negative 12/04/2019 1352   KETONESUR NEGATIVE 04/29/2020 2000   PROTEINUR NEGATIVE 04/29/2020 2000   UROBILINOGEN 0.2 12/04/2019 1352   UROBILINOGEN 0.2 02/07/2011 1832   NITRITE NEGATIVE 04/29/2020 2000   LEUKOCYTESUR 117 (A) 04/29/2020 2000   Sepsis Labs: @LABRCNTIP (procalcitonin:4,lacticidven:4)  ) Recent Results (from the past 240 hour(s))  Respiratory Panel by RT PCR (Flu A&B, Covid) - Nasopharyngeal Swab     Status: None   Collection Time: 04/29/20 10:30 AM   Specimen: Nasopharyngeal Swab  Result Value Ref Range Status   SARS Coronavirus 2 by RT PCR NEGATIVE NEGATIVE Final    Comment: (NOTE) SARS-CoV-2 target nucleic acids are NOT DETECTED.  The SARS-CoV-2 RNA is generally detectable in upper respiratoy specimens during the acute phase of infection. The lowest concentration of SARS-CoV-2 viral copies this assay can detect is 131 copies/mL. A negative result does not preclude SARS-Cov-2 infection and should not be used as the sole basis for treatment or other patient management decisions. A negative result may occur with  improper specimen collection/handling, submission of specimen other than nasopharyngeal swab, presence of viral mutation(s) within the areas targeted by this assay, and inadequate number of viral copies (<131 copies/mL). A negative result must be combined with clinical observations, patient history, and epidemiological information. The expected result is Negative.  Fact Sheet for Patients:   PinkCheek.be  Fact Sheet for Healthcare Providers:  GravelBags.it  This test is no t yet approved or cleared by the [place] FDA and  has been authorized for detection and/or diagnosis of SARS-CoV-2 by FDA under an Emergency Use Authorization (EUA). This EUA will remain  in effect (meaning this test can be used) for the duration of the COVID-19 declaration under Section 564(b)(1) of the Act, 21 U.S.C. section 360bbb-3(b)(1), unless the authorization is terminated or revoked sooner.     Influenza A by PCR NEGATIVE NEGATIVE Final   Influenza B by PCR NEGATIVE NEGATIVE Final    Comment: (NOTE) The Xpert Xpress SARS-CoV-2/FLU/RSV assay is intended as an aid in  the diagnosis of influenza from Nasopharyngeal swab specimens and  should not be used as a sole basis for treatment. Nasal washings and  aspirates are unacceptable for Xpert Xpress SARS-CoV-2/FLU/RSV  testing.  Fact Sheet for Patients: PinkCheek.be  Fact Sheet for Healthcare Providers: GravelBags.it  This test is not yet approved or cleared by the [place] FDA and  has been authorized for detection and/or diagnosis of SARS-CoV-2 by  FDA under an Emergency Use Authorization (EUA). This EUA will remain  in effect (meaning this test can be used) for the duration of the  Covid-19 declaration under Section 564(b)(1) of the Act, 21  U.S.C. section 360bbb-3(b)(1), unless the authorization is  terminated or revoked. Performed at [facility], [address], [place], [place] [address]       Studies: CT Abdomen Pelvis Wo Contrast  Result Date: 04/29/2020 CLINICAL DATA:  Acute upper abdominal pain and distention. EXAM: CT ABDOMEN AND PELVIS WITHOUT CONTRAST TECHNIQUE: Multidetector CT imaging of the abdomen and pelvis was performed following the standard protocol without IV contrast.  COMPARISON:  February 06, 2016.  Dec 06, 2016. FINDINGS: Lower chest: Small left pleural effusion is noted. Mild bibasilar subsegmental atelectasis is noted. Hepatobiliary: Status post cholecystectomy. No biliary dilatation is noted. Nodular hepatic margins are noted consistent with hepatic cirrhosis. Mild amount of perihepatic ascites is noted. 2.8 x 2.5 cm complex hyperdense abnormality is noted in right hepatic lobe posteriorly which is enlarged compared to prior exam and concerning for hepatocellular carcinoma. Pancreas: Unremarkable. No pancreatic ductal dilatation or surrounding inflammatory changes. Spleen: Mild splenomegaly is noted with mild amount of perisplenic fluid. Adrenals/Urinary Tract: Adrenal glands appear normal. Stable bilateral renal cysts are noted. No hydronephrosis or renal obstruction is noted. No renal or ureteral calculi are noted. Urinary bladder is unremarkable. Stomach/Bowel: Stomach is unremarkable. Status post right hemicolectomy. There is no evidence of bowel obstruction. Crance thickening and mild surrounding fluid is noted around the distal duodenum  concerning for possible enteritis or peptic ulcer disease. Sigmoid diverticulosis is noted without inflammation. Vascular/Lymphatic: Aortic atherosclerosis. No enlarged abdominal or pelvic lymph nodes. Reproductive: Uterus and bilateral adnexa are unremarkable. Other: Mild amount of ascites is noted in the pelvis and both pericolic gutters. No hernia is noted. Musculoskeletal: No acute or significant osseous findings. IMPRESSION: 1. 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 2. 2.8 x 2.5 cm complex hyperdense abnormality is noted in right hepatic lobe posteriorly which is enlarged compared to prior exam and concerning for hepatocellular carcinoma. Further evaluation with MRI with and without gadolinium administration is recommended. 3. Martinez thickening of distal duodenum is noted with a mild  amount of surrounding fluid concerning for enteritis or peptic ulcer disease. 4. Small left pleural effusion is noted with mild bibasilar subsegmental atelectasis. 5. Sigmoid diverticulosis without inflammation. 6. Aortic atherosclerosis. Aortic Atherosclerosis (ICD10-I70.0). Electronically Signed   By: Marijo Conception M.D.   On: 04/29/2020 11:08    Scheduled Meds: . amLODipine  2.5 mg Oral Daily  . atenolol  50 mg Oral Daily  . enoxaparin (LOVENOX) injection  40 mg Subcutaneous Q24H  . ferrous sulfate  325 mg Oral Daily  . insulin aspart  0-9 Units Subcutaneous TID WC  . levothyroxine  88 mcg Oral Q0600  . pantoprazole  40 mg Oral Daily    Continuous Infusions: . sodium chloride 100 mL/hr at 04/30/20 0000     LOS: 0 days     Cristal Deer, MD [facility]  To reach me or the doctor on call, go to: [email or web address] Password [email or web address]  04/30/2020, 10:17 AM

## 2020-05-01 LAB — COMPREHENSIVE METABOLIC PANEL
ALT: 17 U/L (ref 0–44)
AST: 26 U/L (ref 15–41)
Albumin: 2.6 g/dL — ABNORMAL LOW (ref 3.5–5.0)
Alkaline Phosphatase: 80 U/L (ref 38–126)
Anion gap: 6 (ref 5–15)
BUN: 15 mg/dL (ref 8–23)
CO2: 22 mmol/L (ref 22–32)
Calcium: 8.3 mg/dL — ABNORMAL LOW (ref 8.9–10.3)
Chloride: 107 mmol/L (ref 98–111)
Creatinine, Ser: 1.06 mg/dL — ABNORMAL HIGH (ref 0.44–1.00)
GFR, Estimated: 50 mL/min — ABNORMAL LOW (ref >60)
Glucose, Bld: 159 mg/dL — ABNORMAL HIGH (ref 70–99)
Potassium: 3.9 mmol/L (ref 3.5–5.1)
Sodium: 135 mmol/L (ref 135–145)
Total Bilirubin: 1.2 mg/dL (ref 0.3–1.2)
Total Protein: 5.5 g/dL — ABNORMAL LOW (ref 6.5–8.1)

## 2020-05-01 LAB — CBC WITH DIFFERENTIAL/PLATELET
Abs Immature Granulocytes: 0.06 10*3/uL (ref 0.00–0.07)
Basophils Absolute: 0 10*3/uL (ref 0.0–0.1)
Basophils Relative: 0 %
Eosinophils Absolute: 0 10*3/uL (ref 0.0–0.5)
Eosinophils Relative: 1 %
HCT: 32 % — ABNORMAL LOW (ref 36.0–46.0)
Hemoglobin: 10.5 g/dL — ABNORMAL LOW (ref 12.0–15.0)
Immature Granulocytes: 1 %
Lymphocytes Relative: 23 %
Lymphs Abs: 1.3 10*3/uL (ref 0.7–4.0)
MCH: 30.1 pg (ref 26.0–34.0)
MCHC: 32.8 g/dL (ref 30.0–36.0)
MCV: 91.7 fL (ref 80.0–100.0)
Monocytes Absolute: 0.4 10*3/uL (ref 0.1–1.0)
Monocytes Relative: 7 %
Neutro Abs: 3.9 10*3/uL (ref 1.7–7.7)
Neutrophils Relative %: 68 %
Platelets: 109 10*3/uL — ABNORMAL LOW (ref 150–400)
RBC: 3.49 MIL/uL — ABNORMAL LOW (ref 3.87–5.11)
RDW: 15.1 % (ref 11.5–15.5)
WBC: 5.8 10*3/uL (ref 4.0–10.5)
nRBC: 0 % (ref 0.0–0.2)

## 2020-05-01 LAB — GASTROINTESTINAL PANEL BY PCR, STOOL (REPLACES STOOL CULTURE)

## 2020-05-01 LAB — GLUCOSE, CAPILLARY
Glucose-Capillary: 166 mg/dL — ABNORMAL HIGH (ref 70–99)
Glucose-Capillary: 174 mg/dL — ABNORMAL HIGH (ref 70–99)
Glucose-Capillary: 170 mg/dL — ABNORMAL HIGH (ref 70–99)
Glucose-Capillary: 150 mg/dL — ABNORMAL HIGH (ref 70–99)

## 2020-05-01 MED ORDER — ONDANSETRON HCL 4 MG/2ML IJ SOLN
4.0000 mg | Freq: Four times a day (QID) | INTRAMUSCULAR | Status: DC | PRN
  Administered 2020-05-01: 4 mg via INTRAVENOUS
  Filled 2020-05-01: qty 2

## 2020-05-01 NOTE — Progress Notes (Signed)
Patient is on contact precautions to rule out CDIFF infection. Patient was originally having liquid type 7 stools. She had her 1st BM today at 1515 type 6 stool (mushy with chunks) stool does not meet CDIFF testing requirements. Notified Ugah MD and made her aware, she may be discontinuing the contact precautions since patient has not had a liquid stool for some time now.

## 2020-05-01 NOTE — Progress Notes (Signed)
Patients GI panel resulted today. Norovirus is (+) MD Kyung [place]acc[place] will be continuing contact precautions

## 2020-05-02 DIAGNOSIS — K529 Noninfective gastroenteritis and colitis, unspecified: Secondary | ICD-10-CM | POA: Diagnosis not present

## 2020-05-02 LAB — GLUCOSE, CAPILLARY
Glucose-Capillary: 181 mg/dL — ABNORMAL HIGH (ref 70–99)
Glucose-Capillary: 153 mg/dL — ABNORMAL HIGH (ref 70–99)

## 2020-05-02 NOTE — Discharge Summary (Signed)
Physician Discharge Summary  Cheryl Jordan ZDG:[id number] DOB: Jan 03, 1941 DOA: 04/29/2020  PCP: Brunetta Jeans, PA-C  Admit date: 04/29/2020 Discharge date: 05/02/2020  Admitted From: Home Disposition: Home  Recommendations for Outpatient Follow-up:  1. Follow up with PCP in 1-2 weeks   Home Health: Not applicable Equipment/Devices: Not applicable  Discharge Condition: Stable CODE STATUS: Partial Diet recommendation: Regular diet, low carb  Discharge summary: 79 year old female with history of hypothyroidism, CKD stage III, type 2 diabetes, hypertension admitted to the hospital with several days of epigastric abdominal pain and distention, intermittent emesis.  Reported illness in family, has been admitted with vomiting and aspiration pneumonia. In the emergency room patient was afebrile and hemodynamically stable.  On room air.  CT scan showed liver lesion, cirrhosis and mild splenomegaly, distal duodenal thickening.  Patient is known to have hepatic abnormality.  Cancer in remission.  Nausea, vomiting, gastroenteritis with electrolyte abnormalities and acute renal failure due to norovirus infection: Treated with symptomatic treatment, IV fluids and nausea medications with adequate improvement.  Diarrhea has improved.  She is tolerating diet as well as able to keep up liquids.  With adequate improvement, she is going home today with symptomatic treatment.  She has no more diarrhea, no more isolation indicated.  All other chronic medical issues remained stable and she will resume all her home medications. CT scan of the abdomen showed liver lesion which is known to the patient and her oncologist.  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  Hydrate yourself well.   Increase activity slowly   Complete by: As directed      Allergies as of 05/02/2020      Reactions   Aspirin Other (See Comments)   Runny nose    Codeine Nausea And Vomiting      Medication List    TAKE these medications   Accu-Chek Aviva device Use as instructed   Accu-Chek Aviva Plus test strip Generic drug: glucose blood   acetaminophen 500 MG tablet Commonly known as: TYLENOL Take 500 mg by mouth every 6 (six) hours as needed for moderate pain.   amLODipine 2.5 MG tablet Commonly known as: NORVASC Take 1 tablet (2.5 mg total) by mouth daily.   atenolol 50 MG tablet Commonly known as: TENORMIN TAKE 1 TABLET BY MOUTH IN  THE MORNING What changed: when to take this   calcium citrate-vitamin D 315-200 MG-UNIT tablet Commonly known as: CITRACAL+D Take 1 tablet by mouth daily.   ferrous sulfate 325 (65 FE) MG EC tablet Take 1 tablet (325 mg total) by mouth 2 (two) times daily with a meal. What changed: when to take this   glipiZIDE 5 MG 24 hr tablet Commonly known as: GLUCOTROL XL TAKE (1) TABLET DAILY WITH BREAKFAST. What changed: See the new instructions.   levothyroxine 88 MCG tablet Commonly known as: SYNTHROID Take 1 tablet (  88 mcg total) by mouth daily.   loperamide 2 MG capsule Commonly known as: IMODIUM Take 2 capsules by mouth once. Then take additional 2mg  capsule as needed for continued diarrhea. No more than 16 mg in 24 hours. What changed:   how much to take  how to take this  when to take this  reasons to take this   LORazepam 0.5 MG tablet Commonly known as: ATIVAN Take 0.5 mg by mouth  every 8 (eight) hours as needed for anxiety. for anxiety   omeprazole 20 MG capsule Commonly known as: PRILOSEC Take 1 capsule (20 mg total) by mouth daily.   ondansetron 4 MG disintegrating tablet Commonly known as: Zofran ODT Take 1 tablet (4 mg total) by mouth every 8 (eight) hours as needed for nausea or vomiting.   telmisartan 40 MG tablet Commonly known as: MICARDIS Take 1 tablet (40 mg total) by mouth daily. What changed:   how much to take  when to take this       Follow-up Information    Brunetta Jeans, PA-C Follow up in 2 week(s).   Specialty: Family Medicine Contact information: [address] [place] [address] [phone]              Allergies  Allergen Reactions  . Aspirin Other (See Comments)    Runny nose   . Codeine Nausea And Vomiting    Consultations:  None   Procedures/Studies: CT Abdomen Pelvis Wo Contrast  Result Date: 04/29/2020 CLINICAL DATA:  Acute upper abdominal pain and distention. EXAM: CT ABDOMEN AND PELVIS WITHOUT CONTRAST TECHNIQUE: Multidetector CT imaging of the abdomen and pelvis was performed following the standard protocol without IV contrast. COMPARISON:  February 06, 2016.  Dec 06, 2016. FINDINGS: Lower chest: Small left pleural effusion is noted. Mild bibasilar subsegmental atelectasis is noted. Hepatobiliary: Status post cholecystectomy. No biliary dilatation is noted. Nodular hepatic margins are noted consistent with hepatic cirrhosis. Mild amount of perihepatic ascites is noted. 2.8 x 2.5 cm complex hyperdense abnormality is noted in right hepatic lobe posteriorly which is enlarged compared to prior exam and concerning for hepatocellular carcinoma. Pancreas: Unremarkable. No pancreatic ductal dilatation or surrounding inflammatory changes. Spleen: Mild splenomegaly is noted with mild amount of perisplenic fluid. Adrenals/Urinary Tract: Adrenal glands appear normal. Stable bilateral renal cysts are noted. No  hydronephrosis or renal obstruction is noted. No renal or ureteral calculi are noted. Urinary bladder is unremarkable. Stomach/Bowel: Stomach is unremarkable. Status post right hemicolectomy. There is no evidence of bowel obstruction. Dalto thickening and mild surrounding fluid is noted around the distal duodenum concerning for possible enteritis or peptic ulcer disease. Sigmoid diverticulosis is noted without inflammation. Vascular/Lymphatic: Aortic atherosclerosis. No enlarged abdominal or pelvic lymph nodes. Reproductive: Uterus and bilateral adnexa are unremarkable. Other: Mild amount of ascites is noted in the pelvis and both pericolic gutters. No hernia is noted. Musculoskeletal: No acute or significant osseous findings. IMPRESSION: 1. 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 2. 2.8 x 2.5 cm complex hyperdense abnormality is noted in right hepatic lobe posteriorly which is enlarged compared to prior exam and concerning for hepatocellular carcinoma. Further evaluation with MRI with and without gadolinium administration is recommended. 3. Bernardy thickening of distal duodenum is noted with a mild amount of surrounding fluid concerning for enteritis or peptic ulcer disease. 4. Small left pleural effusion is noted with mild bibasilar subsegmental atelectasis. 5. Sigmoid diverticulosis without inflammation. 6. Aortic atherosclerosis. Aortic  Atherosclerosis (ICD10-I70.0). Electronically Signed   By: Marijo Conception M.D.   On: 04/29/2020 11:08   DG Chest 2 View  Result Date: 04/10/2020 CLINICAL DATA:  Shortness of breath for several months EXAM: CHEST - 2 VIEW COMPARISON:  11/16/2017 FINDINGS: Cardiac shadow is enlarged but stable. The lungs are well aerated bilaterally. Linear density is noted in the lateral aspect of left mid lung. No sizable effusion is seen. No bony abnormality is noted. IMPRESSION: Linear density in the left mid lung laterally likely  representing some early infiltrate. Electronically Signed   By: Inez Catalina M.D.   On: 04/10/2020 15:34   ECHOCARDIOGRAM COMPLETE  Result Date: 04/08/2020    ECHOCARDIOGRAM REPORT   Patient Name:   Cheryl Jordan Pfeffer   Date of Exam: 04/08/2020 Medical Rec #:  [id number]     Height:       67.0 in Accession #:    [id number]    Weight:       222.0 lb Date of Birth:  11-Aug-1940      BSA:          2.114 m Patient Age:    64 years      BP:           156/74 mmHg Patient Gender: F             HR:           60 bpm. Exam Location:  [facility] Procedure: 2D Echo, 3D Echo, Cardiac Doppler, Color Doppler and Strain Analysis Indications:    R06.00 Dyspnea on exertion  History:        Patient has prior history of Echocardiogram examinations, most                 recent 11/17/2017. Risk Factors:Hypertension and Diabetes. Colon                 cancer. CKD stage 3.  Sonographer:    Jessee Avers, RDCS Referring Phys: [place]  1. Left ventricular ejection fraction, by estimation, is 60 to 65%. The left ventricle has normal function. The left ventricle has no regional Raimer motion abnormalities. There is moderate left ventricular hypertrophy. Left ventricular diastolic parameters are consistent with Grade I diastolic dysfunction (impaired relaxation).  2. Right ventricular systolic function is normal. The right ventricular size is normal. Tricuspid regurgitation signal is inadequate for assessing PA pressure.  3. Left atrial size was mildly dilated.  4. The mitral valve is normal in structure. Mild mitral valve regurgitation. No evidence of mitral stenosis.  5. The aortic valve is tricuspid. Aortic valve regurgitation is not visualized. Mild aortic valve stenosis. Aortic valve area, by VTI measures 2.11 cm. Aortic valve mean gradient measures 11.0 mmHg.  6. The inferior vena cava is normal in size with greater than 50% respiratory variability, suggesting right atrial pressure of 3 mmHg. Comparison(s): 11/17/17 EF  65-70%. FINDINGS  Left Ventricle: Left ventricular ejection fraction, by estimation, is 60 to 65%. The left ventricle has normal function. The left ventricle has no regional Bilski motion abnormalities. The left ventricular internal cavity size was normal in size. There is  moderate left ventricular hypertrophy. Left ventricular diastolic parameters are consistent with Grade I diastolic dysfunction (impaired relaxation). Right Ventricle: The right ventricular size is normal. No increase in right ventricular Wrobleski thickness. Right ventricular systolic function is normal. Tricuspid regurgitation signal is inadequate for assessing PA pressure. Left Atrium: Left atrial size was mildly dilated. Right  Atrium: Right atrial size was normal in size. Pericardium: Trivial pericardial effusion is present. Mitral Valve: The mitral valve is normal in structure. There is mild calcification of the mitral valve leaflet(s). Mild mitral valve regurgitation. No evidence of mitral valve stenosis. Tricuspid Valve: The tricuspid valve is normal in structure. Tricuspid valve regurgitation is trivial. Aortic Valve: The aortic valve is tricuspid. Aortic valve regurgitation is not visualized. Mild aortic stenosis is present. Aortic valve mean gradient measures 11.0 mmHg. Aortic valve peak gradient measures 18.7 mmHg. Aortic valve area, by VTI measures 2.11 cm. Pulmonic Valve: The pulmonic valve was normal in structure. Pulmonic valve regurgitation is trivial. Aorta: The aortic root is normal in size and structure. Venous: The inferior vena cava is normal in size with greater than 50% respiratory variability, suggesting right atrial pressure of 3 mmHg. IAS/Shunts: No atrial level shunt detected by color flow Doppler.  LEFT VENTRICLE PLAX 2D LVIDd:         4.20 cm  Diastology LVIDs:         3.00 cm  LV e' medial:    4.75 cm/s LV PW:         1.40 cm  LV E/e' medial:  16.4 LV IVS:        1.40 cm  LV e' lateral:   6.81 cm/s LVOT diam:     2.00 cm   LV E/e' lateral: 11.5 LV SV:         109 LV SV Index:   52       2D Longitudinal Strain LVOT Area:     3.14 cm 2D Strain GLS (A2C):   -23.3 %                         2D Strain GLS (A3C):   -22.0 %                         2D Strain GLS (A4C):   -21.7 %                         2D Strain GLS Avg:     -22.3 %                          3D Volume EF:                         3D EF:        55 %                         LV EDV:       141 ml                         LV ESV:       63 ml                         LV SV:        78 ml RIGHT VENTRICLE RV Basal diam:  2.90 cm RV S prime:     8.31 cm/s TAPSE (M-mode): 2.1 cm LEFT ATRIUM             Index       RIGHT ATRIUM  Index LA diam:        5.40 cm 2.55 cm/m  RA Pressure: 3.00 mmHg LA Vol (A2C):   59.0 ml 27.91 ml/m RA Area:     8.72 cm LA Vol (A4C):   69.8 ml 33.02 ml/m RA Volume:   14.30 ml  6.76 ml/m LA Biplane Vol: 65.8 ml 31.13 ml/m  AORTIC VALVE AV Area (Vmax):    1.77 cm AV Area (Vmean):   1.79 cm AV Area (VTI):     2.11 cm AV Vmax:           216.50 cm/s AV Vmean:          144.500 cm/s AV VTI:            0.516 m AV Peak Grad:      18.7 mmHg AV Mean Grad:      11.0 mmHg LVOT Vmax:         122.00 cm/s LVOT Vmean:        82.300 cm/s LVOT VTI:          0.347 m LVOT/AV VTI ratio: 0.67  AORTA Ao Root diam: 3.30 cm Ao Asc diam:  3.60 cm MITRAL VALVE               TRICUSPID VALVE                            Estimated RAP:  3.00 mmHg  MV E velocity: 78.00 cm/s  SHUNTS MV A velocity: 98.50 cm/s  Systemic VTI:  0.35 m MV E/A ratio:  0.79        Systemic Diam: 2.00 cm Loralie Champagne MD Electronically signed by Loralie Champagne MD Signature Date/Time: 04/08/2020/5:33:50 PM    Final     (Echo, Carotid, EGD, Colonoscopy, ERCP)    Subjective: Patient seen and examined.  Granddaughter at the bedside.  Patient herself does not have great appetite, however denies any nausea vomiting.  Last bowel movement was yesterday morning and none since then.  She wants to go  home.   Discharge Exam: Vitals:   05/01/20 2241 05/02/20 0620  BP: 135/70 (!) 183/70  Pulse: 67 66  Resp:  16  Temp:  98 F (36.7 C)  SpO2:  93%   Vitals:   05/01/20 2040 05/01/20 2232 05/01/20 2241 05/02/20 0620  BP: (!) 182/74 (!) 165/59 135/70 (!) 183/70  Pulse: 68 62 67 66  Resp: 18   16  Temp: 98.2 F (36.8 C)   98 F (36.7 C)  TempSrc: Oral   Oral  SpO2: 93% 91%  93%  Weight:      Height:        General: Pt is alert, awake, not in acute distress Cardiovascular: RRR, S1/S2 +, no rubs, no gallops Respiratory: CTA bilaterally, no wheezing, no rhonchi Abdominal: Soft, NT, ND, bowel sounds + Extremities: no edema, no cyanosis    The results of significant diagnostics from this hospitalization (including imaging, microbiology, ancillary and laboratory) are listed below for reference.     Microbiology: Recent Results (from the past 240 hour(s))  Respiratory Panel by RT PCR (Flu A&B, Covid) - Nasopharyngeal Swab     Status: None   Collection Time: 04/29/20 10:30 AM   Specimen: Nasopharyngeal Swab  Result Value Ref Range Status   SARS Coronavirus 2 by RT PCR NEGATIVE NEGATIVE Final    Comment: (NOTE) SARS-CoV-2 target nucleic acids are NOT DETECTED.  The SARS-CoV-2 RNA is  generally detectable in upper respiratoy specimens during the acute phase of infection. The lowest concentration of SARS-CoV-2 viral copies this assay can detect is 131 copies/mL. A negative result does not preclude SARS-Cov-2 infection and should not be used as the sole basis for treatment or other patient management decisions. A negative result may occur with  improper specimen collection/handling, submission of specimen other than nasopharyngeal swab, presence of viral mutation(s) within the areas targeted by this assay, and inadequate number of viral copies (<131 copies/mL). A negative result must be combined with clinical observations, patient history, and epidemiological information.  The expected result is Negative.  Fact Sheet for Patients:  PinkCheek.be  Fact Sheet for Healthcare Providers:  GravelBags.it  This test is no t yet approved or cleared by the [place] FDA and  has been authorized for detection and/or diagnosis of SARS-CoV-2 by FDA under an Emergency Use Authorization (EUA). This EUA will remain  in effect (meaning this test can be used) for the duration of the COVID-19 declaration under Section 564(b)(1) of the Act, 21 U.S.C. section 360bbb-3(b)(1), unless the authorization is terminated or revoked sooner.     Influenza A by PCR NEGATIVE NEGATIVE Final   Influenza B by PCR NEGATIVE NEGATIVE Final    Comment: (NOTE) The Xpert Xpress SARS-CoV-2/FLU/RSV assay is intended as an aid in  the diagnosis of influenza from Nasopharyngeal swab specimens and  should not be used as a sole basis for treatment. Nasal washings and  aspirates are unacceptable for Xpert Xpress SARS-CoV-2/FLU/RSV  testing.  Fact Sheet for Patients: PinkCheek.be  Fact Sheet for Healthcare Providers: GravelBags.it  This test is not yet approved or cleared by the [place] FDA and  has been authorized for detection and/or diagnosis of SARS-CoV-2 by  FDA under an Emergency Use Authorization (EUA). This EUA will remain  in effect (meaning this test can be used) for the duration of the  Covid-19 declaration under Section 564(b)(1) of the Act, 21  U.S.C. section 360bbb-3(b)(1), unless the authorization is  terminated or revoked. Performed at [facility], [place] [address] [place], [place] [address]   Gastrointestinal Panel by PCR , Stool     Status: Abnormal   Collection Time: 04/30/20 10:31 AM   Specimen: Stool  Result Value Ref Range Status   Campylobacter species NOT DETECTED NOT DETECTED Final   Plesimonas shigelloides NOT DETECTED  NOT DETECTED Final   Salmonella species NOT DETECTED NOT DETECTED Final   Yersinia enterocolitica NOT DETECTED NOT DETECTED Final   Vibrio species NOT DETECTED NOT DETECTED Final   Vibrio cholerae NOT DETECTED NOT DETECTED Final   Enteroaggregative E coli (EAEC) NOT DETECTED NOT DETECTED Final   Enteropathogenic E coli (EPEC) NOT DETECTED NOT DETECTED Final   Enterotoxigenic E coli (ETEC) NOT DETECTED NOT DETECTED Final   Shiga like toxin producing E coli (STEC) NOT DETECTED NOT DETECTED Final   Shigella/Enteroinvasive E coli (EIEC) NOT DETECTED NOT DETECTED Final   Cryptosporidium NOT DETECTED NOT DETECTED Final   Cyclospora cayetanensis NOT DETECTED NOT DETECTED Final   Entamoeba histolytica NOT DETECTED NOT DETECTED Final   Giardia lamblia NOT DETECTED NOT DETECTED Final   Adenovirus F40/41 NOT DETECTED NOT DETECTED Final   Astrovirus NOT DETECTED NOT DETECTED Final   Norovirus GI/GII DETECTED (A) NOT DETECTED Final    Comment: RESULT CALLED TO, READ BACK BY AND VERIFIED WITH: BRITANNY BORING AT 1627 05/01/20.PMF    Rotavirus A NOT DETECTED NOT DETECTED Final   Sapovirus (I,  II, IV, and V) NOT DETECTED NOT DETECTED Final    Comment: Performed at [facility], [address] [place], [place] [address]     Labs: BNP (last 3 results) No results for input(s): BNP in the last 8760 hours. Basic Metabolic Panel: Recent Labs  Lab 04/29/20 1006 04/30/20 0406 05/01/20 0551  NA 135 135 135  K 4.0 4.0 3.9  CL 104 107 107  CO2 22 19* 22  GLUCOSE 200* 164* 159*  BUN 25* 18 15  CREATININE 1.32* 0.99 1.06*  CALCIUM 8.7* 7.9* 8.3*   Liver Function Tests: Recent Labs  Lab 04/29/20 1006 04/30/20 0406 05/01/20 0551  AST 30 24 26   ALT 20 17 17   ALKPHOS 90 72 80  BILITOT 1.7* 1.5* 1.2  PROT 6.2* 5.2* 5.5*  ALBUMIN 3.1* 2.5* 2.6*   Recent Labs  Lab 04/29/20 1006  LIPASE 95*   No results for input(s): AMMONIA in the last 168 hours. CBC: Recent Labs  Lab  04/29/20 1006 04/30/20 0406 05/01/20 0833  WBC 6.8 5.3 5.8  NEUTROABS  --   --  3.9  HGB 11.2* 9.9* 10.5*  HCT 34.2* 30.8* 32.0*  MCV 92.7 92.5 91.7  PLT 100* 83* 109*   Cardiac Enzymes: No results for input(s): CKTOTAL, CKMB, CKMBINDEX, TROPONINI in the last 168 hours. BNP: Invalid input(s): POCBNP CBG: Recent Labs  Lab 05/01/20 1201 05/01/20 1630 05/01/20 2042 05/02/20 0733 05/02/20 1144  GLUCAP 174* 170* 150* 181* 153*   D-Dimer No results for input(s): DDIMER in the last 72 hours. Hgb A1c No results for input(s): HGBA1C in the last 72 hours. Lipid Profile No results for input(s): CHOL, HDL, LDLCALC, TRIG, CHOLHDL, LDLDIRECT in the last 72 hours. Thyroid function studies No results for input(s): TSH, T4TOTAL, T3FREE, THYROIDAB in the last 72 hours.  Invalid input(s): FREET3 Anemia work up No results for input(s): VITAMINB12, FOLATE, FERRITIN, TIBC, IRON, RETICCTPCT in the last 72 hours. Urinalysis    Component Value Date/Time   COLORURINE YELLOW 04/29/2020 2000   APPEARANCEUR CLEAR 04/29/2020 2000   LABSPEC 1.011 04/29/2020 2000   PHURINE 6.0 04/29/2020 2000   GLUCOSEU NEGATIVE 04/29/2020 2000   HGBUR NEGATIVE 04/29/2020 2000   BILIRUBINUR NEGATIVE 04/29/2020 2000   BILIRUBINUR Negative 12/04/2019 1352   [place] 04/29/2020 2000   PROTEINUR NEGATIVE 04/29/2020 2000   UROBILINOGEN 0.2 12/04/2019 1352   UROBILINOGEN 0.2 02/07/2011 1832   NITRITE NEGATIVE 04/29/2020 2000   LEUKOCYTESUR 117 (A) 04/29/2020 2000   Sepsis Labs Invalid input(s): PROCALCITONIN,  WBC,  LACTICIDVEN Microbiology Recent Results (from the past 240 hour(s))  Respiratory Panel by RT PCR (Flu A&B, Covid) - Nasopharyngeal Swab     Status: None   Collection Time: 04/29/20 10:30 AM   Specimen: Nasopharyngeal Swab  Result Value Ref Range Status   SARS Coronavirus 2 by RT PCR NEGATIVE NEGATIVE Final    Comment: (NOTE) SARS-CoV-2 target nucleic acids are NOT DETECTED.  The  SARS-CoV-2 RNA is generally detectable in upper respiratoy specimens during the acute phase of infection. The lowest concentration of SARS-CoV-2 viral copies this assay can detect is 131 copies/mL. A negative result does not preclude SARS-Cov-2 infection and should not be used as the sole basis for treatment or other patient management decisions. A negative result may occur with  improper specimen collection/handling, submission of specimen other than nasopharyngeal swab, presence of viral mutation(s) within the areas targeted by this assay, and inadequate number of viral copies (<131 copies/mL). A negative result must  be combined with clinical observations, patient history, and epidemiological information. The expected result is Negative.  Fact Sheet for Patients:  PinkCheek.be  Fact Sheet for Healthcare Providers:  GravelBags.it  This test is no t yet approved or cleared by the [place] FDA and  has been authorized for detection and/or diagnosis of SARS-CoV-2 by FDA under an Emergency Use Authorization (EUA). This EUA will remain  in effect (meaning this test can be used) for the duration of the COVID-19 declaration under Section 564(b)(1) of the Act, 21 U.S.C. section 360bbb-3(b)(1), unless the authorization is terminated or revoked sooner.     Influenza A by PCR NEGATIVE NEGATIVE Final   Influenza B by PCR NEGATIVE NEGATIVE Final    Comment: (NOTE) The Xpert Xpress SARS-CoV-2/FLU/RSV assay is intended as an aid in  the diagnosis of influenza from Nasopharyngeal swab specimens and  should not be used as a sole basis for treatment. Nasal washings and  aspirates are unacceptable for Xpert Xpress SARS-CoV-2/FLU/RSV  testing.  Fact Sheet for Patients: PinkCheek.be  Fact Sheet for Healthcare Providers: GravelBags.it  This test is not yet approved or cleared  by the [place] FDA and  has been authorized for detection and/or diagnosis of SARS-CoV-2 by  FDA under an Emergency Use Authorization (EUA). This EUA will remain  in effect (meaning this test can be used) for the duration of the  Covid-19 declaration under Section 564(b)(1) of the Act, 21  U.S.C. section 360bbb-3(b)(1), unless the authorization is  terminated or revoked. Performed at [facility], [place] [address], [place], [place] [address]   Gastrointestinal Panel by PCR , Stool     Status: Abnormal   Collection Time: 04/30/20 10:31 AM   Specimen: Stool  Result Value Ref Range Status   Campylobacter species NOT DETECTED NOT DETECTED Final   Plesimonas shigelloides NOT DETECTED NOT DETECTED Final   Salmonella species NOT DETECTED NOT DETECTED Final   Yersinia enterocolitica NOT DETECTED NOT DETECTED Final   Vibrio species NOT DETECTED NOT DETECTED Final   Vibrio cholerae NOT DETECTED NOT DETECTED Final   Enteroaggregative E coli (EAEC) NOT DETECTED NOT DETECTED Final   Enteropathogenic E coli (EPEC) NOT DETECTED NOT DETECTED Final   Enterotoxigenic E coli (ETEC) NOT DETECTED NOT DETECTED Final   Shiga like toxin producing E coli (STEC) NOT DETECTED NOT DETECTED Final   Shigella/Enteroinvasive E coli (EIEC) NOT DETECTED NOT DETECTED Final   Cryptosporidium NOT DETECTED NOT DETECTED Final   Cyclospora cayetanensis NOT DETECTED NOT DETECTED Final   Entamoeba histolytica NOT DETECTED NOT DETECTED Final   Giardia lamblia NOT DETECTED NOT DETECTED Final   Adenovirus F40/41 NOT DETECTED NOT DETECTED Final   Astrovirus NOT DETECTED NOT DETECTED Final   Norovirus GI/GII DETECTED (A) NOT DETECTED Final    Comment: RESULT CALLED TO, READ BACK BY AND VERIFIED WITH: BRITANNY BORING AT 1627 05/01/20.PMF    Rotavirus A NOT DETECTED NOT DETECTED Final   Sapovirus (I, II, IV, and V) NOT DETECTED NOT DETECTED Final    Comment: Performed at [facility], [address], [place], [place] [address]     Time coordinating discharge: 35 minutes  SIGNED:   Barb Merino, MD  [facility] 05/02/2020, 12:44 PM

## 2020-05-03 ENCOUNTER — Telehealth: Payer: Self-pay

## 2020-05-03 NOTE — Telephone Encounter (Signed)
Transition Care Management Follow-up Telephone Call  Date of discharge and from where: 05/02/2020-[place]  How have you been since you were released from the hospital? As well as can be expected. No appetite  Any questions or concerns? No  Items Reviewed:  Did the pt receive and understand the discharge instructions provided? Yes   Medications obtained and verified? Yes   Other? Yes   Any new allergies since your discharge? No   Dietary orders reviewed? Yes  Do you have support at home? Yes   Home Care and Equipment/Supplies: Were home health services ordered? no If so, what is the name of the agency? N/A Has the agency set up a time to come to the patient's home? not applicable Were any new equipment or medical supplies ordered?  No What is the name of the medical supply agency? N/A Were you able to get the supplies/equipment? not applicable Do you have any questions related to the use of the equipment or supplies? No  Functional Questionnaire: (I = Independent and D = Dependent) ADLs: I  Bathing/Dressing- I  Meal Prep- I  Eating- I  Maintaining continence- I  Transferring/Ambulation- I  Managing Meds- I  Follow up appointments reviewed:   PCP Hospital f/u appt confirmed? No  Patient refused to make an appt at time of call. States she will call back in a couple of days to schedule.  [facility] f/u appt confirmed? No  N/A  Are transportation arrangements needed? No   If their condition worsens, is the pt aware to call PCP or go to the Emergency Dept.? Yes  Was the patient provided with contact information for the PCP's office or ED? Yes  Was to pt encouraged to call back with questions or concerns? Yes

## 2020-05-10 ENCOUNTER — Inpatient Hospital Stay (HOSPITAL_COMMUNITY)
Admission: EM | Admit: 2020-05-10 | Discharge: 2020-05-12 | DRG: 433 | Disposition: A | Discharge status: Home Health | Payer: Medicare Other | Attending: Internal Medicine | Admitting: Internal Medicine

## 2020-05-10 ENCOUNTER — Other Ambulatory Visit: Payer: Self-pay

## 2020-05-10 ENCOUNTER — Encounter (HOSPITAL_COMMUNITY): Payer: Self-pay

## 2020-05-10 ENCOUNTER — Emergency Department (HOSPITAL_COMMUNITY): Payer: Medicare Other

## 2020-05-10 DIAGNOSIS — Z886 Allergy status to analgesic agent status: Secondary | ICD-10-CM

## 2020-05-10 DIAGNOSIS — Z9049 Acquired absence of other specified parts of digestive tract: Secondary | ICD-10-CM

## 2020-05-10 DIAGNOSIS — K7460 Unspecified cirrhosis of liver: Principal | ICD-10-CM | POA: Diagnosis present

## 2020-05-10 DIAGNOSIS — M810 Age-related osteoporosis without current pathological fracture: Secondary | ICD-10-CM | POA: Diagnosis present

## 2020-05-10 DIAGNOSIS — D631 Anemia in chronic kidney disease: Secondary | ICD-10-CM | POA: Diagnosis present

## 2020-05-10 DIAGNOSIS — Z20822 Contact with and (suspected) exposure to covid-19: Secondary | ICD-10-CM | POA: Diagnosis present

## 2020-05-10 DIAGNOSIS — Z905 Acquired absence of kidney: Secondary | ICD-10-CM

## 2020-05-10 DIAGNOSIS — Z79890 Hormone replacement therapy (postmenopausal): Secondary | ICD-10-CM

## 2020-05-10 DIAGNOSIS — Z85038 Personal history of other malignant neoplasm of large intestine: Secondary | ICD-10-CM

## 2020-05-10 DIAGNOSIS — K219 Gastro-esophageal reflux disease without esophagitis: Secondary | ICD-10-CM | POA: Diagnosis present

## 2020-05-10 DIAGNOSIS — E1169 Type 2 diabetes mellitus with other specified complication: Secondary | ICD-10-CM | POA: Diagnosis present

## 2020-05-10 DIAGNOSIS — Z833 Family history of diabetes mellitus: Secondary | ICD-10-CM

## 2020-05-10 DIAGNOSIS — J45998 Other asthma: Secondary | ICD-10-CM | POA: Diagnosis present

## 2020-05-10 DIAGNOSIS — Z9221 Personal history of antineoplastic chemotherapy: Secondary | ICD-10-CM

## 2020-05-10 DIAGNOSIS — R0602 Shortness of breath: Secondary | ICD-10-CM | POA: Diagnosis present

## 2020-05-10 DIAGNOSIS — N183 Chronic kidney disease, stage 3 unspecified: Secondary | ICD-10-CM | POA: Diagnosis present

## 2020-05-10 DIAGNOSIS — Z885 Allergy status to narcotic agent status: Secondary | ICD-10-CM

## 2020-05-10 DIAGNOSIS — R188 Other ascites: Secondary | ICD-10-CM | POA: Diagnosis present

## 2020-05-10 DIAGNOSIS — N1831 Chronic kidney disease, stage 3a: Secondary | ICD-10-CM | POA: Diagnosis present

## 2020-05-10 DIAGNOSIS — E7849 Other hyperlipidemia: Secondary | ICD-10-CM | POA: Diagnosis present

## 2020-05-10 DIAGNOSIS — Z8249 Family history of ischemic heart disease and other diseases of the circulatory system: Secondary | ICD-10-CM | POA: Diagnosis not present

## 2020-05-10 DIAGNOSIS — E039 Hypothyroidism, unspecified: Secondary | ICD-10-CM | POA: Diagnosis present

## 2020-05-10 DIAGNOSIS — R059 Cough, unspecified: Secondary | ICD-10-CM

## 2020-05-10 DIAGNOSIS — I129 Hypertensive chronic kidney disease with stage 1 through stage 4 chronic kidney disease, or unspecified chronic kidney disease: Secondary | ICD-10-CM | POA: Diagnosis present

## 2020-05-10 DIAGNOSIS — I82441 Acute embolism and thrombosis of right tibial vein: Secondary | ICD-10-CM | POA: Diagnosis present

## 2020-05-10 DIAGNOSIS — C189 Malignant neoplasm of colon, unspecified: Secondary | ICD-10-CM | POA: Diagnosis present

## 2020-05-10 DIAGNOSIS — D649 Anemia, unspecified: Secondary | ICD-10-CM | POA: Diagnosis present

## 2020-05-10 DIAGNOSIS — R0600 Dyspnea, unspecified: Secondary | ICD-10-CM

## 2020-05-10 DIAGNOSIS — E785 Hyperlipidemia, unspecified: Secondary | ICD-10-CM | POA: Diagnosis present

## 2020-05-10 DIAGNOSIS — Z79899 Other long term (current) drug therapy: Secondary | ICD-10-CM

## 2020-05-10 DIAGNOSIS — E1122 Type 2 diabetes mellitus with diabetic chronic kidney disease: Secondary | ICD-10-CM | POA: Diagnosis present

## 2020-05-10 DIAGNOSIS — E8809 Other disorders of plasma-protein metabolism, not elsewhere classified: Secondary | ICD-10-CM | POA: Diagnosis present

## 2020-05-10 DIAGNOSIS — Z7984 Long term (current) use of oral hypoglycemic drugs: Secondary | ICD-10-CM | POA: Diagnosis not present

## 2020-05-10 DIAGNOSIS — Z801 Family history of malignant neoplasm of trachea, bronchus and lung: Secondary | ICD-10-CM

## 2020-05-10 DIAGNOSIS — Z825 Family history of asthma and other chronic lower respiratory diseases: Secondary | ICD-10-CM

## 2020-05-10 DIAGNOSIS — R7989 Other specified abnormal findings of blood chemistry: Secondary | ICD-10-CM | POA: Diagnosis not present

## 2020-05-10 LAB — BASIC METABOLIC PANEL
Anion gap: 12 (ref 5–15)
BUN: 11 mg/dL (ref 8–23)
CO2: 23 mmol/L (ref 22–32)
Calcium: 8.6 mg/dL — ABNORMAL LOW (ref 8.9–10.3)
Chloride: 105 mmol/L (ref 98–111)
Creatinine, Ser: 1.02 mg/dL — ABNORMAL HIGH (ref 0.44–1.00)
GFR, Estimated: 56 mL/min — ABNORMAL LOW (ref >60)
Glucose, Bld: 163 mg/dL — ABNORMAL HIGH (ref 70–99)
Potassium: 4.4 mmol/L (ref 3.5–5.1)
Sodium: 140 mmol/L (ref 135–145)

## 2020-05-10 LAB — D-DIMER, QUANTITATIVE: D-Dimer, Quant: 4.16 ug/mL-FEU — ABNORMAL HIGH (ref 0.00–0.50)

## 2020-05-10 LAB — CBC
HCT: 33.8 % — ABNORMAL LOW (ref 36.0–46.0)
Hemoglobin: 10.5 g/dL — ABNORMAL LOW (ref 12.0–15.0)
MCH: 28.8 pg (ref 26.0–34.0)
MCHC: 31.1 g/dL (ref 30.0–36.0)
MCV: 92.9 fL (ref 80.0–100.0)
Platelets: 141 10*3/uL — ABNORMAL LOW (ref 150–400)
RBC: 3.64 MIL/uL — ABNORMAL LOW (ref 3.87–5.11)
RDW: 14.6 % (ref 11.5–15.5)
WBC: 7.7 10*3/uL (ref 4.0–10.5)
nRBC: 0 % (ref 0.0–0.2)

## 2020-05-10 LAB — RESPIRATORY PANEL BY RT PCR (FLU A&B, COVID)
Influenza A by PCR: NEGATIVE
Influenza B by PCR: NEGATIVE
SARS Coronavirus 2 by RT PCR: NEGATIVE

## 2020-05-10 LAB — TROPONIN I (HIGH SENSITIVITY)
Troponin I (High Sensitivity): 8 ng/L (ref <18)
Troponin I (High Sensitivity): 8 ng/L (ref <18)

## 2020-05-10 LAB — POC OCCULT BLOOD, ED: Fecal Occult Bld: POSITIVE — AB

## 2020-05-10 MED ORDER — GUAIFENESIN 100 MG/5ML PO SOLN
5.0000 mL | ORAL | Status: DC | PRN
  Administered 2020-05-10: 100 mg via ORAL
  Filled 2020-05-10: qty 10

## 2020-05-10 MED ORDER — SODIUM CHLORIDE 0.9 % IV SOLN: 250.0000 mL | INTRAVENOUS | Status: DC | PRN

## 2020-05-10 MED ORDER — ACETAMINOPHEN 325 MG PO TABS: 650.0000 mg | ORAL_TABLET | ORAL | Status: DC | PRN

## 2020-05-10 MED ORDER — METHYLPREDNISOLONE SODIUM SUCC 40 MG IJ SOLR
40.0000 mg | Freq: Two times a day (BID) | INTRAMUSCULAR | Status: DC
  Administered 2020-05-10 – 2020-05-11 (×2): 40 mg via INTRAVENOUS
  Filled 2020-05-10 (×2): qty 1

## 2020-05-10 MED ORDER — HEPARIN SODIUM (PORCINE) 5000 UNIT/ML IJ SOLN
5000.0000 [IU] | Freq: Three times a day (TID) | INTRAMUSCULAR | Status: DC
  Administered 2020-05-10: 5000 [IU] via SUBCUTANEOUS
  Filled 2020-05-10: qty 1

## 2020-05-10 MED ORDER — IOHEXOL 350 MG/ML SOLN
100.0000 mL | Freq: Once | INTRAVENOUS | Status: AC | PRN
  Administered 2020-05-10: 100 mL via INTRAVENOUS

## 2020-05-10 MED ORDER — ONDANSETRON 4 MG PO TBDP: 4.0000 mg | ORAL_TABLET | Freq: Three times a day (TID) | ORAL | Status: DC | PRN

## 2020-05-10 MED ORDER — ATENOLOL 50 MG PO TABS
50.0000 mg | ORAL_TABLET | Freq: Every morning | ORAL | Status: DC
  Administered 2020-05-11 – 2020-05-12 (×2): 50 mg via ORAL
  Filled 2020-05-10 (×2): qty 1

## 2020-05-10 MED ORDER — BENZONATATE 100 MG PO CAPS
100.0000 mg | ORAL_CAPSULE | Freq: Three times a day (TID) | ORAL | Status: DC
  Administered 2020-05-10 – 2020-05-12 (×6): 100 mg via ORAL
  Filled 2020-05-10 (×6): qty 1

## 2020-05-10 MED ORDER — IRBESARTAN 150 MG PO TABS
75.0000 mg | ORAL_TABLET | Freq: Every day | ORAL | Status: DC
  Administered 2020-05-10 – 2020-05-12 (×3): 75 mg via ORAL
  Filled 2020-05-10 (×3): qty 1

## 2020-05-10 MED ORDER — HEPARIN SODIUM (PORCINE) 5000 UNIT/ML IJ SOLN
5000.0000 [IU] | Freq: Three times a day (TID) | INTRAMUSCULAR | Status: DC
  Administered 2020-05-11 (×2): 5000 [IU] via SUBCUTANEOUS
  Filled 2020-05-10 (×2): qty 1

## 2020-05-10 MED ORDER — SODIUM CHLORIDE 0.9% FLUSH: 3.0000 mL | INTRAVENOUS | Status: DC | PRN

## 2020-05-10 MED ORDER — LEVOTHYROXINE SODIUM 88 MCG PO TABS
88.0000 ug | ORAL_TABLET | Freq: Every day | ORAL | Status: DC
  Administered 2020-05-11 – 2020-05-12 (×2): 88 ug via ORAL
  Filled 2020-05-10 (×2): qty 1

## 2020-05-10 MED ORDER — SODIUM CHLORIDE (PF) 0.9 % IJ SOLN
INTRAMUSCULAR | Status: AC
  Filled 2020-05-10: qty 50

## 2020-05-10 MED ORDER — AMLODIPINE BESYLATE 5 MG PO TABS
2.5000 mg | ORAL_TABLET | Freq: Every day | ORAL | Status: DC
  Administered 2020-05-10 – 2020-05-12 (×3): 2.5 mg via ORAL
  Filled 2020-05-10 (×3): qty 1

## 2020-05-10 MED ORDER — ALBUTEROL SULFATE HFA 108 (90 BASE) MCG/ACT IN AERS: 1.0000 | INHALATION_SPRAY | RESPIRATORY_TRACT | Status: DC | PRN

## 2020-05-10 MED ORDER — SODIUM CHLORIDE 0.9% FLUSH
3.0000 mL | Freq: Two times a day (BID) | INTRAVENOUS | Status: DC
  Administered 2020-05-10 – 2020-05-12 (×4): 3 mL via INTRAVENOUS

## 2020-05-10 MED ORDER — ONDANSETRON HCL 4 MG/2ML IJ SOLN: 4.0000 mg | Freq: Four times a day (QID) | INTRAMUSCULAR | Status: DC | PRN

## 2020-05-10 NOTE — ED Notes (Signed)
Inpatient provider at bedside.

## 2020-05-10 NOTE — ED Provider Notes (Signed)
[facility] DEPT Provider Note   CSN: [id number] Arrival date & time: 05/10/20  1287     History Chief Complaint  Patient presents with  . Chest Pain  . Cough  . Shortness of Breath    Cheryl Jordan is a 79 y.o. female.  HPI Patient presents with shortness of breath and cough.  States recently was in the hospital and while she was in the hospital developed a cough.  States that she is gone home she continues to have an occasionally productive cough.  Has clear sputum and most the time she does not bring anything up.  States she does have some chills at times.  Does feel more short of breath.  Has some swelling in her legs but states this is not unusual for her.  Dull left-sided chest pain at times also.  No abdominal pain.  But states that when she tries to eat she is having trouble keeping it down like when she was in the hospital.    Past Medical History:  Diagnosis Date  . Allergy   . Anemia    hx of  . Anxiety   . Arthritis   . Colon cancer ([place]) dx'd 11/2014  . Diabetes mellitus without complication ([place])    TYPE II  . Fatty liver   . Gallbladder disease   . GERD (gastroesophageal reflux disease)   . History of gout   . Hypertension   . Hypothyroidism   . Kidney disorder    spot on left kidney  . Osteoporosis   . PONV (postoperative nausea and vomiting)     Patient Active Problem List   Diagnosis Date Noted  . Enteritis 04/29/2020  . AKI (acute kidney injury) ([place]) 04/29/2020  . Abnormal finding on imaging of liver 02/01/2019  . Chronic kidney disease (CKD), stage III (moderate) ([place]) 12/09/2018  . Malignant neoplasm of colon ([place]) 08/11/2015  . 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 Hypertension associated with diabetes ([place]) 12/17/2013  . Arthritis 12/17/2013  . Hypothyroidism 10/16/2011  . GERD (gastroesophageal reflux disease) 10/16/2011    Past Surgical  History:  Procedure Laterality Date  . CHOLECYSTECTOMY    . IR RADIOLOGIST EVAL & MGMT  01/24/2017  . KNEE CARTILAGE SURGERY Bilateral   . NEPHRECTOMY Left 02/10/2015   Procedure: OPEN RETROPERINTONEAL EXPLORATION LEFT RENAL  CYST DECORTICATION X 5;  Surgeon: Cleon Gustin, MD;  Location: [facility];  Service: Urology;  Laterality: Left;  . PARTIAL COLECTOMY N/A 02/10/2015   Procedure: OPEN RIGHT  COLECTOMY ;  Surgeon: [name], MD;  Location: [facility];  Service: General;  Laterality: N/A;  . TUBAL LIGATION       OB History   No obstetric history on file.     Family History  Problem Relation Age of Onset  . Diabetes Mother   . Hypertension Other   . COPD Father   . Hypertension Sister   . GER disease Sister   . Heart attack Brother   . COPD Brother   . Lung cancer Brother   . COPD Brother   . Hypertension Brother     Social History   Tobacco Use  . Smoking status: Never Smoker  . Smokeless tobacco: Never Used  Vaping Use  . Vaping Use: Never used  Substance Use Topics  . Alcohol use: No  . Drug use: No    Home Medications Prior to Admission medications  Medication Sig Start Date End Date Taking? Authorizing Provider  ACCU-CHEK AVIVA PLUS test strip  10/20/18   [provider]  acetaminophen (TYLENOL) 500 MG tablet Take 500 mg by mouth every 6 (six) hours as needed for moderate pain.    [provider]  amLODipine (NORVASC) 2.5 MG tablet Take 1 tablet (2.5 mg total) by mouth daily. 12/04/19   Brunetta Jeans, PA-C  atenolol (TENORMIN) 50 MG tablet TAKE 1 TABLET BY MOUTH IN  THE MORNING Patient taking differently: Take 50 mg by mouth daily.  02/03/20   Brunetta Jeans, PA-C  Blood Glucose Monitoring Suppl (ACCU-CHEK AVIVA) device Use as instructed 07/03/19 07/02/20  Brunetta Jeans, PA-C  calcium citrate-vitamin D (CITRACAL+D) 315-200 MG-UNIT tablet Take 1 tablet by mouth daily.     [provider]  ferrous sulfate 325 (65 FE) MG EC  tablet Take 1 tablet (325 mg total) by mouth 2 (two) times daily with a meal. Patient taking differently: Take 325 mg by mouth daily.  02/02/20   Brunetta Jeans, PA-C  glipiZIDE (GLUCOTROL XL) 5 MG 24 hr tablet TAKE (1) TABLET DAILY WITH BREAKFAST. Patient taking differently: Take 5 mg by mouth daily.  12/03/19   Brunetta Jeans, PA-C  levothyroxine (SYNTHROID) 88 MCG tablet Take 1 tablet (88 mcg total) by mouth daily. 12/04/19   Brunetta Jeans, PA-C  loperamide (IMODIUM) 2 MG capsule Take 2 capsules by mouth once. Then take additional 2mg  capsule as needed for continued diarrhea. No more than 16 mg in 24 hours. Patient taking differently: Take 2 mg by mouth as needed for diarrhea or loose stools. Take 2 capsules by mouth once. Then take additional 2mg  capsule as needed for continued diarrhea. No more than 16 mg in 24 hours. 04/27/20   Brunetta Jeans, PA-C  LORazepam (ATIVAN) 0.5 MG tablet Take 0.5 mg by mouth every 8 (eight) hours as needed for anxiety. for anxiety 12/04/16   [provider]  omeprazole (PRILOSEC) 20 MG capsule Take 1 capsule (20 mg total) by mouth daily. 12/04/19   Brunetta Jeans, PA-C  ondansetron (ZOFRAN ODT) 4 MG disintegrating tablet Take 1 tablet (4 mg total) by mouth every 8 (eight) hours as needed for nausea or vomiting. 04/27/20   Brunetta Jeans, PA-C  telmisartan (MICARDIS) 40 MG tablet Take 1 tablet (40 mg total) by mouth daily. Patient taking differently: Take 20 mg by mouth in the morning and at bedtime.  03/01/20   Brunetta Jeans, PA-C    Allergies    Aspirin and Codeine  Review of Systems   Review of Systems  Constitutional: Positive for appetite change.  HENT: Negative for congestion.   Respiratory: Positive for cough and shortness of breath.   Cardiovascular: Positive for leg swelling. Negative for chest pain.  Gastrointestinal: Positive for nausea. Negative for abdominal pain.  Genitourinary: Negative for dysuria.  Musculoskeletal:  Negative for back pain.  Skin: Negative for rash.  Neurological: Negative for weakness.  Hematological: Negative for adenopathy.  Psychiatric/Behavioral: Negative for confusion.    Physical Exam Updated Vital Signs BP (!) 160/72   Pulse 76   Temp 98.2 F (36.8 C) (Oral)   Resp 14   Ht 5\' 7"  (1.702 m)   Wt 98.9 kg   SpO2 90%   BMI 34.14 kg/m   Physical Exam Vitals and nursing note reviewed.  HENT:     Head: Atraumatic.  Cardiovascular:     Rate and Rhythm: Normal rate  and regular rhythm.  Pulmonary:     Effort: No tachypnea.     Breath sounds: No wheezing, rhonchi or rales.  Chest:     Chest Rohrer: No tenderness.  Musculoskeletal:     Right lower leg: No tenderness. Edema present.     Left lower leg: No tenderness. Edema present.  Skin:    General: Skin is warm.     Capillary Refill: Capillary refill takes less than 2 seconds.  Neurological:     Mental Status: She is alert and oriented to person, place, and time.     ED Results / Procedures / Treatments   Labs (all labs ordered are listed, but only abnormal results are displayed) Labs Reviewed  BASIC METABOLIC PANEL - Abnormal; Notable for the following components:      Result Value   Glucose, Bld 163 (*)    Creatinine, Ser 1.02 (*)    Calcium 8.6 (*)    GFR, Estimated 56 (*)    All other components within normal limits  CBC - Abnormal; Notable for the following components:   RBC 3.64 (*)    Hemoglobin 10.5 (*)    HCT 33.8 (*)    Platelets 141 (*)    All other components within normal limits  D-DIMER, QUANTITATIVE (NOT AT [facility]) - Abnormal; Notable for the following components:   D-Dimer, Quant 4.16 (*)    All other components within normal limits  RESPIRATORY PANEL BY RT PCR (FLU A&B, COVID)  TROPONIN I (HIGH SENSITIVITY)  TROPONIN I (HIGH SENSITIVITY)    EKG EKG Interpretation  Date/Time:  Tuesday May 10 2020 08:50:50 EDT Ventricular Rate:  75 PR Interval:    QRS Duration: 80 QT  Interval:  402 QTC Calculation: 449 R Axis:   12 Text Interpretation: Sinus rhythm Atrial premature complexes Abnormal R-wave progression, early transition Confirmed by Davonna Belling [phone]) on 05/10/2020 9:11:28 AM   Radiology DG Chest 2 View  Result Date: 05/10/2020 CLINICAL DATA:  Chest pain shortness of breath. EXAM: CHEST - 2 VIEW COMPARISON:  Multiple priors, most recent 04/08/2020. FINDINGS: Mild enlargement the cardiac silhouette. The previously seen linear densities in left lateral midlung appears more nodular on today's exam. Pulmonary vascular congestion small bilateral pleural effusions. No discernible pneumothorax. The visualized skeletal structures are unremarkable. IMPRESSION: 1. The previously seen linear densities in left lateral midlung appears more nodular on today's exam. While this could represent pneumonia, underlying pulmonary nodule is not excluded. Recommend short interval follow-up chest radiograph to ensure resolution or chest CT to further evaluate. 2. Mild cardiomegaly, pulmonary vascular congestion and small bilateral pleural effusions. Findings and recommendations discussed with Dr. Alvino Chapel at 9:43 AM via telephone. Electronically Signed   By: Margaretha Sheffield MD   On: 05/10/2020 09:45   CT Angio Chest PE W and/or Wo Contrast  Result Date: 05/10/2020 CLINICAL DATA:  Chest pain and shortness of breath EXAM: CT ANGIOGRAPHY CHEST WITH CONTRAST TECHNIQUE: Multidetector CT imaging of the chest was performed using the standard protocol during bolus administration of intravenous contrast. Multiplanar CT image reconstructions and MIPs were obtained to evaluate the vascular anatomy. CONTRAST:  138mL OMNIPAQUE IOHEXOL 350 MG/ML SOLN COMPARISON:  2019 FINDINGS: Cardiovascular: Poor contrast bolus timing and respiratory artifact limit evaluation of the pulmonary arteries beyond the central and proximal lobar levels. No embolism is identified. Mild calcified plaque along the  thoracic aorta. Mild cardiomegaly with left atrial enlargement. No pericardial effusion. Mediastinum/Nodes: No enlarged lymph nodes. Thyroid and esophagus are unremarkable. Lungs/Pleura: Small  left and trace right pleural effusions. Mild bibasilar atelectasis. No pneumothorax. Upper Abdomen: Cirrhosis with ascites. Right hepatic lobe lesion is poorly evaluated on this study. Musculoskeletal: No acute osseous abnormality. Review of the MIP images confirms the above findings. IMPRESSION: Suboptimal evaluation for pulmonary embolism due to contrast bolus timing and respiratory motion. No acute pulmonary embolism within the central and some of the proximal lobar arteries. Small left and trace right pleural effusions. Mild bibasilar atelectasis. Cirrhosis with ascites. Electronically Signed   By: Macy Mis M.D.   On: 05/10/2020 11:17    Procedures Procedures (including critical care time)  Medications Ordered in ED Medications  iohexol (OMNIPAQUE) 350 MG/ML injection 100 mL (100 mLs Intravenous Contrast Given 05/10/20 1023)    ED Course  I have reviewed the triage vital signs and the nursing notes.  Pertinent labs & imaging results that were available during my care of the patient were reviewed by me and considered in my medical decision making (see chart for details).    MDM Rules/Calculators/A&P                          Patient presents with cough shortness of breath and generalized fatigue.  Began while she was in the hospital with recent admission for abdominal symptoms.  Reviewed work-up from that time.  However with recent hospitalization pulmonary embolism considered high likelihood.  D-dimer was elevated also.  CT scan done due to D-dimer and some abnormal x-ray findings.  CT scan however was suboptimal for pulmonary embolism.  Unable to determine pulmonary was on rule out.  However does have some sats at all times dropped down to 90% at rest.  Also has had decreased oral intake.  Lab  work overall reassuring.  However has been more fatigued unable to manage well at home at this time.  Reported he has had very little oral intake.  I feels the patient would benefit from admission to the hospital for further work-up and rule out a pulmonary embolism and may need further evaluation for abdominal symptoms.  Will discuss with hospitalist.  Covid test has been added however the patient is vaccinated.  I reviewed imaging and lab work.  Differential diagnosis for the dyspnea including pneumonia CHF blood clots pneumothorax.   Final Clinical Impression(s) / ED Diagnoses Final diagnoses:  Dyspnea, unspecified type  Cough    Rx / DC Orders ED Discharge Orders    None       Davonna Belling, MD 05/10/20 1237

## 2020-05-10 NOTE — ED Triage Notes (Cosign Needed)
Patient reports that she has had intermittent mid and left chest pain during the night with SOB. Patient also reports that she has had a productive cough with clear sputum x 2 weeks

## 2020-05-10 NOTE — ED Notes (Signed)
Report given to [facility] in 4W

## 2020-05-10 NOTE — Progress Notes (Deleted)
Cardiology Office Note   Date:  05/10/2020   ID:  Cheryl Jordan, DOB Mar 18, 1941, MRN [id number]  PCP:  Cheryl Jeans, PA-C  Cardiologist:   No primary care provider on file. Referring:  Cheryl Jeans, PA-C  No chief complaint on file.     History of Present Illness: Cheryl Jordan is a 79 y.o. female who is referred by Cheryl Jeans, PA-C for evaluation of DOE.   She does not have any cardiac history other than an overnight hospital stay for chest pain.  I did review these records and it was felt to be atypical. She did have an echo in 2019 with no significant abnormalities.  This was at the time of that hospitalization.    She says she has been getting short of breath for the last couple of months.  This is worse with activity such as walking upstairs.  She can make it up the stairs which she has to collect herself although she does not have to stop when she gets to the top.  She does not have resting shortness of breath, PND or orthopnea.  He does have some resting episodes of shortness of breath, on sporadically.  She has some mild feet swelling.  This has been progressive over a couple months 2.  She denies any chest pressure, neck or arm discomfort.  She has had some slow steady weight.   Past Medical History:  Diagnosis Date  . Allergy   . Anemia    hx of  . Anxiety   . Arthritis   . Colon cancer ([place]) dx'd 11/2014  . Diabetes mellitus without complication ([place])    TYPE II  . Fatty liver   . Gallbladder disease   . GERD (gastroesophageal reflux disease)   . History of gout   . Hypertension   . Hypothyroidism   . Kidney disorder    spot on left kidney  . Osteoporosis   . PONV (postoperative nausea and vomiting)     Past Surgical History:  Procedure Laterality Date  . CHOLECYSTECTOMY    . IR RADIOLOGIST EVAL & MGMT  01/24/2017  . KNEE CARTILAGE SURGERY Bilateral   . NEPHRECTOMY Left 02/10/2015   Procedure: OPEN RETROPERINTONEAL EXPLORATION LEFT RENAL  CYST  DECORTICATION X 5;  Surgeon: Cleon Gustin, MD;  Location: [facility];  Service: Urology;  Laterality: Left;  . PARTIAL COLECTOMY N/A 02/10/2015   Procedure: OPEN RIGHT  COLECTOMY ;  Surgeon: [name], MD;  Location: [facility];  Service: General;  Laterality: N/A;  . TUBAL LIGATION       Current Outpatient Medications  Medication Sig Dispense Refill  . ACCU-CHEK AVIVA PLUS test strip     . acetaminophen (TYLENOL) 500 MG tablet Take 500 mg by mouth every 6 (six) hours as needed for moderate pain.    Marland Kitchen amLODipine (NORVASC) 2.5 MG tablet Take 1 tablet (2.5 mg total) by mouth daily. 90 tablet 2  . atenolol (TENORMIN) 50 MG tablet TAKE 1 TABLET BY MOUTH IN  THE MORNING (Patient taking differently: Take 50 mg by mouth daily. ) 90 tablet 2  . Blood Glucose Monitoring Suppl (ACCU-CHEK AVIVA) device Use as instructed 1 each 0  . calcium citrate-vitamin D (CITRACAL+D) 315-200 MG-UNIT tablet Take 1 tablet by mouth daily.     . ferrous sulfate 325 (65 FE) MG EC tablet Take 1 tablet (325 mg total) by mouth 2 (two) times daily with a meal. (Patient taking differently:  Take 325 mg by mouth daily. ) 90 tablet 1  . glipiZIDE (GLUCOTROL XL) 5 MG 24 hr tablet TAKE (1) TABLET DAILY WITH BREAKFAST. (Patient taking differently: Take 5 mg by mouth daily. ) 90 tablet 1  . levothyroxine (SYNTHROID) 88 MCG tablet Take 1 tablet (88 mcg total) by mouth daily. 90 tablet 2  . loperamide (IMODIUM) 2 MG capsule Take 2 capsules by mouth once. Then take additional 2mg  capsule as needed for continued diarrhea. No more than 16 mg in 24 hours. (Patient taking differently: Take 2 mg by mouth as needed for diarrhea or loose stools. Take 2 capsules by mouth once. Then take additional 2mg  capsule as needed for continued diarrhea. No more than 16 mg in 24 hours.) 30 capsule 0  . LORazepam (ATIVAN) 0.5 MG tablet Take 0.5 mg by mouth every 8 (eight) hours as needed for anxiety. for anxiety  0  . omeprazole (PRILOSEC) 20 MG capsule Take 1  capsule (20 mg total) by mouth daily. 90 capsule 2  . ondansetron (ZOFRAN ODT) 4 MG disintegrating tablet Take 1 tablet (4 mg total) by mouth every 8 (eight) hours as needed for nausea or vomiting. 20 tablet 0  . telmisartan (MICARDIS) 40 MG tablet Take 1 tablet (40 mg total) by mouth daily. (Patient taking differently: Take 20 mg by mouth in the morning and at bedtime. ) 90 tablet 2   No current facility-administered medications for this visit.    Allergies:   Aspirin and Codeine    Social History:  The patient  reports that she has never smoked. She has never used smokeless tobacco. She reports that she does not drink alcohol and does not use drugs.   Family History:  The patient's family history includes COPD in her brother, brother, and father; Diabetes in her mother; GER disease in her sister; Heart attack in her brother; Hypertension in her brother, sister, and another family member; Lung cancer in her brother.    ROS:  Please see the history of present illness.   Otherwise, review of systems are positive for none.   All other systems are reviewed and negative.    PHYSICAL EXAM: VS:  There were no vitals taken for this visit. , BMI There is no height or weight on file to calculate BMI. GENERAL:  Well appearing HEENT:  Pupils equal round and reactive, fundi not visualized, oral mucosa unremarkable NECK:  No jugular venous distention, waveform within normal limits, carotid upstroke brisk and symmetric, no bruits, no thyromegaly LYMPHATICS:  No cervical, inguinal adenopathy LUNGS:  Clear to auscultation bilaterally BACK:  No CVA tenderness CHEST:  Unremarkable HEART:  PMI not displaced or sustained,S1 and S2 within normal limits, no S3, no S4, no clicks, no rubs, 2 out of 6 brief apical systolic murmur and heard best at the left upper sternal border, no diastolic murmurs ABD:  Flat, positive bowel sounds normal in frequency in pitch, no bruits, no rebound, no guarding, no midline  pulsatile mass, no hepatomegaly, no splenomegaly EXT:  2 plus pulses throughout, mild bilateral ankle edema, no cyanosis no clubbing SKIN:  No rashes no nodules NEURO:  Cranial nerves II through XII grossly intact, motor grossly intact throughout PSYCH:  Cognitively intact, oriented to person place and time    EKG:  EKG is ordered today. The ekg ordered today demonstrates sinus rhythm, rate 65, axis within normal limits, intervals within normal limits, no acute ST-T wave changes.   Recent Labs: 01/27/2020: TSH 0.88 03/30/2020: NT-Pro  BNP 250 05/01/2020: ALT 17 05/10/2020: BUN 11; Creatinine, Ser 1.02; Hemoglobin 10.5; Platelets 141; Potassium 4.4; Sodium 140    Lipid Panel    Component Value Date/Time   CHOL 116 01/27/2020 1138   TRIG 92.0 01/27/2020 1138   HDL 43.80 01/27/2020 1138   CHOLHDL 3 01/27/2020 1138   VLDL 18.4 01/27/2020 1138   LDLCALC 53 01/27/2020 1138      Wt Readings from Last 3 Encounters:  05/10/20 218 lb (98.9 kg)  04/29/20 218 lb (98.9 kg)  03/30/20 222 lb (100.7 kg)      Other studies Reviewed: Additional studies/ records that were reviewed today include: Labs. Review of the above records demonstrates:  Please see elsewhere in the note.     ASSESSMENT AND PLAN:  DOE:  *** The patient has dyspnea that could be multifactorial in part related to weight and deconditioning.  She does have a very slight systolic murmur the previous echo was okay.  I am going to start with a BNP level and an echocardiogram.  I will also check a chest x-ray I will consider ischemia evaluation although her symptoms are somewhat atypical for this.  I do note that she has had a very slight anemia.  Her thyroid and electrolytes are unremarkable.  These of been going on the last couple of months.  HTN: Her blood pressure is *** elevated.  She is going to keep a blood pressure diary.  She might need further med titration.  DM: A1c is 6.5.  I will defer to Cheryl Jeans, PA-C     COVID EDUCATION: She has had her vaccine   Current medicines are reviewed at length with the patient today.  The patient does not have concerns regarding medicines.  The following changes have been made:  no change  Labs/ tests ordered today include:   No orders of the defined types were placed in this encounter.    Disposition:   FU with me after the above testing   Signed, Minus Breeding, MD  05/10/2020 11:34 AM    [place]

## 2020-05-10 NOTE — ED Notes (Signed)
Pt provided with food / bev. All needs met at this time. Will continue to monitor.

## 2020-05-10 NOTE — ED Notes (Addendum)
Pt ambulated to restroom and back to room with minimal assistance. Pt stated she felt SOB on her way back to the room.

## 2020-05-10 NOTE — H&P (Signed)
History and Physical    CINNAMON MORENCY ZES:[id number] DOB: [birth year] DOA: 05/10/2020  PCP: Brunetta Jeans, PA-C    Patient coming from: Home   Chief Complaint: Shortness of breath   HPI: Cheryl Jordan is a 79 y.o. female with medical history significant of anemia, diabetes mellitus type 2, GERD, history of colon cancer status post colon resection and chemo, hypothyroidism, presents to the ED with complaints of shortness of breath , she was also recently discharged after being hospitalized for Gastroenteritis picture.  States that she did not know that she has been anemic chronically.  Came in because the shortness of breath and her cough was getting worse.  Denies any traveling.  Currently lives at home with her husband. .  ED Course:  Blood pressure (!) 161/73, pulse 78, temperature 98.2 F (36.8 C), temperature source Oral, resp. rate (!) 21, height 5\' 7"  (1.702 m), weight 98.9 kg, SpO2 95 %. Patient is alert awake and oriented in the emergency room gives her history. Labs in the ED show serum creatinine of 1.02, GFR 56, CBC shows a hemoglobin of 10.5.  Evaded D-dimer 4.16.  CTA done in the emergency room was inaccurate due to timing of contrast bolus.   Review of Systems: As per HPI otherwise all systems reviewed and negative.  Past Medical History:  Diagnosis Date  . Allergy   . Anemia    hx of  . Anxiety   . Arthritis   . Colon cancer ([place]) dx'd 11/2014  . Diabetes mellitus without complication ([place])    TYPE II  . Fatty liver   . Gallbladder disease   . GERD (gastroesophageal reflux disease)   . History of gout   . Hypertension   . Hypothyroidism   . Kidney disorder    spot on left kidney  . Osteoporosis   . PONV (postoperative nausea and vomiting)     Past Surgical History:  Procedure Laterality Date  . CHOLECYSTECTOMY    . IR RADIOLOGIST EVAL & MGMT  01/24/2017  . KNEE CARTILAGE SURGERY Bilateral   . NEPHRECTOMY Left 02/10/2015   Procedure: OPEN RETROPERINTONEAL  EXPLORATION LEFT RENAL  CYST DECORTICATION X 5;  Surgeon: Cleon Gustin, MD;  Location: [facility];  Service: Urology;  Laterality: Left;  . PARTIAL COLECTOMY N/A 02/10/2015   Procedure: OPEN RIGHT  COLECTOMY ;  Surgeon: [name], MD;  Location: [facility];  Service: General;  Laterality: N/A;  . TUBAL LIGATION       reports that she has never smoked. She has never used smokeless tobacco. She reports that she does not drink alcohol and does not use drugs.  Allergies  Allergen Reactions  . Aspirin Other (See Comments)    Runny nose   . Codeine Nausea And Vomiting    Family History  Problem Relation Age of Onset  . Diabetes Mother   . Hypertension Other   . COPD Father   . Hypertension Sister   . GER disease Sister   . Heart attack Brother   . COPD Brother   . Lung cancer Brother   . COPD Brother   . Hypertension Brother     Prior to Admission medications   Medication Sig Start Date End Date Taking? Authorizing Provider  ACCU-CHEK AVIVA PLUS test strip  10/20/18   [provider]  acetaminophen (TYLENOL) 500 MG tablet Take 500 mg by mouth every 6 (six) hours as needed for moderate pain.    [provider]  amLODipine (NORVASC) 2.5 MG tablet Take 1 tablet (2.5 mg total) by mouth daily. 12/04/19   Brunetta Jeans, PA-C  atenolol (TENORMIN) 50 MG tablet TAKE 1 TABLET BY MOUTH IN  THE MORNING Patient taking differently: Take 50 mg by mouth daily.  02/03/20   Brunetta Jeans, PA-C  Blood Glucose Monitoring Suppl (ACCU-CHEK AVIVA) device Use as instructed 07/03/19 07/02/20  Brunetta Jeans, PA-C  calcium citrate-vitamin D (CITRACAL+D) 315-200 MG-UNIT tablet Take 1 tablet by mouth daily.     [provider]  ferrous sulfate 325 (65 FE) MG EC tablet Take 1 tablet (325 mg total) by mouth 2 (two) times daily with a meal. Patient taking differently: Take 325 mg by mouth daily.  02/02/20   Brunetta Jeans, PA-C  glipiZIDE (GLUCOTROL XL) 5 MG 24 hr tablet TAKE  (1) TABLET DAILY WITH BREAKFAST. Patient taking differently: Take 5 mg by mouth daily.  12/03/19   Brunetta Jeans, PA-C  levothyroxine (SYNTHROID) 88 MCG tablet Take 1 tablet (88 mcg total) by mouth daily. 12/04/19   Brunetta Jeans, PA-C  loperamide (IMODIUM) 2 MG capsule Take 2 capsules by mouth once. Then take additional 2mg  capsule as needed for continued diarrhea. No more than 16 mg in 24 hours. Patient taking differently: Take 2 mg by mouth as needed for diarrhea or loose stools. Take 2 capsules by mouth once. Then take additional 2mg  capsule as needed for continued diarrhea. No more than 16 mg in 24 hours. 04/27/20   Brunetta Jeans, PA-C  LORazepam (ATIVAN) 0.5 MG tablet Take 0.5 mg by mouth every 8 (eight) hours as needed for anxiety. for anxiety 12/04/16   [provider]  omeprazole (PRILOSEC) 20 MG capsule Take 1 capsule (20 mg total) by mouth daily. 12/04/19   Brunetta Jeans, PA-C  ondansetron (ZOFRAN ODT) 4 MG disintegrating tablet Take 1 tablet (4 mg total) by mouth every 8 (eight) hours as needed for nausea or vomiting. 04/27/20   Brunetta Jeans, PA-C  telmisartan (MICARDIS) 40 MG tablet Take 1 tablet (40 mg total) by mouth daily. Patient taking differently: Take 20 mg by mouth in the morning and at bedtime.  03/01/20   Brunetta Jeans, PA-C    Physical Exam: Vitals:   05/10/20 1600 05/10/20 1630 05/10/20 1700 05/10/20 1730  BP: (!) 178/97 (!) 177/66 (!) 155/68 (!) 161/73  Pulse: 81 75 77 78  Resp: (!) 25 20 (!) 21 (!) 21  Temp:      TempSrc:      SpO2: 90% 93% 95% 95%  Weight:      Height:        Constitutional: NAD, calm, comfortable Vitals:   05/10/20 1600 05/10/20 1630 05/10/20 1700 05/10/20 1730  BP: (!) 178/97 (!) 177/66 (!) 155/68 (!) 161/73  Pulse: 81 75 77 78  Resp: (!) 25 20 (!) 21 (!) 21  Temp:      TempSrc:      SpO2: 90% 93% 95% 95%  Weight:      Height:       Eyes: PERRL, EOMI lids and conjunctivae normal ENMT: Mucous membranes  are moist. Posterior pharynx clear of any exudate or lesions.Normal dentition.  Neck: normal, supple, no masses, no thyromegaly, no carotid bruit  Respiratory: clear to auscultation bilaterally, no wheezing, no crackles. Normal respiratory effort. No accessory muscle use.  Cardiovascular: Regular rate and rhythm, no murmurs / rubs / gallops. No extremity edema. 2+ pedal pulses. No carotid bruits.  Abdomen: no tenderness, no masses palpated. No hepatosplenomegaly. Bowel sounds positive.  Musculoskeletal: no clubbing / cyanosis. No joint deformity upper and lower extremities. Pt moving all four ext , no contractures. Normal muscle tone.  Skin: no rashes, lesions, ulcers. No induration Neurologic: CN 2-12 grossly intact. Strength 5/5 in all 4.  Psychiatric: Normal judgment and insight. Alert and oriented x 3. Normal mood.   Labs on Admission: I have personally reviewed following labs and imaging studies  CBC: Recent Labs  Lab 05/10/20 0839  WBC 7.7  HGB 10.5*  HCT 33.8*  MCV 92.9  PLT 268*   Basic Metabolic Panel: Recent Labs  Lab 05/10/20 0839  NA 140  K 4.4  CL 105  CO2 23  GLUCOSE 163*  BUN 11  CREATININE 1.02*  CALCIUM 8.6*   GFR: Estimated Creatinine Clearance: 54.9 mL/min (A) (by C-G formula based on SCr of 1.02 mg/dL (H)). Liver Function Tests: No results for input(s): AST, ALT, ALKPHOS, BILITOT, PROT, ALBUMIN in the last 168 hours. No results for input(s): LIPASE, AMYLASE in the last 168 hours. No results for input(s): AMMONIA in the last 168 hours. Coagulation Profile: No results for input(s): INR, PROTIME in the last 168 hours. Cardiac Enzymes: No results for input(s): CKTOTAL, CKMB, CKMBINDEX, TROPONINI in the last 168 hours. BNP (last 3 results) Recent Labs    03/30/20 1507  PROBNP 250   HbA1C: No results for input(s): HGBA1C in the last 72 hours. CBG: No results for input(s): GLUCAP in the last 168 hours. Lipid Profile: No results for input(s): CHOL,  HDL, LDLCALC, TRIG, CHOLHDL, LDLDIRECT in the last 72 hours. Thyroid Function Tests: No results for input(s): TSH, T4TOTAL, FREET4, T3FREE, THYROIDAB in the last 72 hours. Anemia Panel: No results for input(s): VITAMINB12, FOLATE, FERRITIN, TIBC, IRON, RETICCTPCT in the last 72 hours. Urine analysis:    Component Value Date/Time   COLORURINE YELLOW 04/29/2020 2000   APPEARANCEUR CLEAR 04/29/2020 2000   LABSPEC 1.011 04/29/2020 2000   PHURINE 6.0 04/29/2020 2000   GLUCOSEU NEGATIVE 04/29/2020 2000   HGBUR NEGATIVE 04/29/2020 2000   BILIRUBINUR NEGATIVE 04/29/2020 2000   BILIRUBINUR Negative 12/04/2019 1352   KETONESUR NEGATIVE 04/29/2020 2000   PROTEINUR NEGATIVE 04/29/2020 2000   UROBILINOGEN 0.2 12/04/2019 1352   UROBILINOGEN 0.2 02/07/2011 1832   NITRITE NEGATIVE 04/29/2020 2000   LEUKOCYTESUR 117 (A) 04/29/2020 2000   No intake or output data in the 24 hours ending 05/10/20 1809 Lab Results  Component Value Date   CREATININE 1.02 (H) 05/10/2020   CREATININE 1.06 (H) 05/01/2020   CREATININE 0.99 04/30/2020    COVID-19 Labs  Recent Labs    05/10/20 0839  DDIMER 4.16*    Lab Results  Component Value Date   SARSCOV2NAA NEGATIVE 05/10/2020   [place] NEGATIVE 04/29/2020   [place] Not Detected 04/16/2019    Radiological Exams on Admission: DG Chest 2 View  Result Date: 05/10/2020 CLINICAL DATA:  Chest pain shortness of breath. EXAM: CHEST - 2 VIEW COMPARISON:  Multiple priors, most recent 04/08/2020. FINDINGS: Mild enlargement the cardiac silhouette. The previously seen linear densities in left lateral midlung appears more nodular on today's exam. Pulmonary vascular congestion small bilateral pleural effusions. No discernible pneumothorax. The visualized skeletal structures are unremarkable. IMPRESSION: 1. The previously seen linear densities in left lateral midlung appears more nodular on today's exam. While this could represent pneumonia, underlying pulmonary  nodule is not excluded. Recommend short interval follow-up chest radiograph to ensure resolution or chest CT to further  evaluate. 2. Mild cardiomegaly, pulmonary vascular congestion and small bilateral pleural effusions. Findings and recommendations discussed with Dr. Alvino Chapel at 9:43 AM via telephone. Electronically Signed   By: Margaretha Sheffield MD   On: 05/10/2020 09:45   CT Angio Chest PE W and/or Wo Contrast  Result Date: 05/10/2020 CLINICAL DATA:  Chest pain and shortness of breath EXAM: CT ANGIOGRAPHY CHEST WITH CONTRAST TECHNIQUE: Multidetector CT imaging of the chest was performed using the standard protocol during bolus administration of intravenous contrast. Multiplanar CT image reconstructions and MIPs were obtained to evaluate the vascular anatomy. CONTRAST:  151mL OMNIPAQUE IOHEXOL 350 MG/ML SOLN COMPARISON:  2019 FINDINGS: Cardiovascular: Poor contrast bolus timing and respiratory artifact limit evaluation of the pulmonary arteries beyond the central and proximal lobar levels. No embolism is identified. Mild calcified plaque along the thoracic aorta. Mild cardiomegaly with left atrial enlargement. No pericardial effusion. Mediastinum/Nodes: No enlarged lymph nodes. Thyroid and esophagus are unremarkable. Lungs/Pleura: Small left and trace right pleural effusions. Mild bibasilar atelectasis. No pneumothorax. Upper Abdomen: Cirrhosis with ascites. Right hepatic lobe lesion is poorly evaluated on this study. Musculoskeletal: No acute osseous abnormality. Review of the MIP images confirms the above findings. IMPRESSION: Suboptimal evaluation for pulmonary embolism due to contrast bolus timing and respiratory motion. No acute pulmonary embolism within the central and some of the proximal lobar arteries. Small left and trace right pleural effusions. Mild bibasilar atelectasis. Cirrhosis with ascites. Electronically Signed   By: Macy Mis M.D.   On: 05/10/2020 11:17    EKG: Independently  reviewed.  Sinus arrhythmia at 78.    Assessment/Plan Principal Problem:   SOB (shortness of breath) Active Problems:   Anemia   Hypothyroidism   GERD (gastroesophageal reflux disease)   Adenocarcinoma of colon (HCC)   Chronic kidney disease (CKD), stage III (moderate) (HCC)   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HCC)  Shortness of breath: -Differentials include venous thromboembolism, congestive heart failure, although patient has had a negative echocardiogram recently, laboratory hypertension, GERD variant asthma, pneumonia however CT was negative for any infections. -Admit patient to med telemetry bed. -Albuterol, Solu-Medrol, repeat CTA in a.m. -Lower extremity venous Dopplers. -And although suspect patient is at high risk for VTE anticoagulation is a discussion that needs to be had with the patient for risk of possible bleeding and worsening anemia.  Anemia: -Suspect combination of anemia of chronic disease but more likely so iron deficiency anemia -The PPI, type and cross, anemia panel, guaiac.  Hypothyroidism: -Thyroid studies and continue levothyroxine at home dose.  GERD: -PPI therapy.    History of colon cancer: -status post colon resection and chemotherapy.   Chronic kidney disease : -Avoid nephrotoxic agents and contrast unless absolutely necessary. -Only dose all needed meds.  Diabetes mellitus type 2: -SSI, A1c, hypoglycemia protocol.   DVT prophylaxis:  SCD'd   Code Status:  Full  Family Communication:  None at bedside   Disposition Plan:  Discharge home  Consults called:  None  Admission status: Inpatient  Status is: Inpatient  Remains inpatient appropriate because:Inpatient level of care appropriate due to severity of illness   Dispo: The patient is from: Home              Anticipated d/c is to: Home              Anticipated d/c date is: 2 days              Patient currently is not medically stable to d/c.  [name]  MD [facility] Pager [phone] If 7PM-7AM, please contact night-coverage [email or web address] Password [facility] 05/10/2020, 6:09 PM

## 2020-05-11 ENCOUNTER — Inpatient Hospital Stay (HOSPITAL_COMMUNITY): Payer: Medicare Other

## 2020-05-11 ENCOUNTER — Ambulatory Visit: Payer: Medicare Other | Admitting: Cardiology

## 2020-05-11 DIAGNOSIS — R7989 Other specified abnormal findings of blood chemistry: Secondary | ICD-10-CM | POA: Diagnosis not present

## 2020-05-11 LAB — BASIC METABOLIC PANEL
Anion gap: 13 (ref 5–15)
BUN: 14 mg/dL (ref 8–23)
CO2: 21 mmol/L — ABNORMAL LOW (ref 22–32)
Calcium: 8.7 mg/dL — ABNORMAL LOW (ref 8.9–10.3)
Chloride: 103 mmol/L (ref 98–111)
Creatinine, Ser: 1.06 mg/dL — ABNORMAL HIGH (ref 0.44–1.00)
GFR, Estimated: 54 mL/min — ABNORMAL LOW (ref >60)
Glucose, Bld: 235 mg/dL — ABNORMAL HIGH (ref 70–99)
Potassium: 4.9 mmol/L (ref 3.5–5.1)
Sodium: 137 mmol/L (ref 135–145)

## 2020-05-11 LAB — BODY FLUID CELL COUNT WITH DIFFERENTIAL
Eos, Fluid: 0 %
Lymphs, Fluid: 44 %
Monocyte-Macrophage-Serous Fluid: 27 % — ABNORMAL LOW (ref 50–90)
Neutrophil Count, Fluid: 29 % — ABNORMAL HIGH (ref 0–25)
Total Nucleated Cell Count, Fluid: 332 cu mm (ref 0–1000)

## 2020-05-11 LAB — PROTEIN, PLEURAL OR PERITONEAL FLUID: Total protein, fluid: <3 g/dL

## 2020-05-11 MED ORDER — LIDOCAINE HCL 1 % IJ SOLN
INTRAMUSCULAR | Status: AC
  Filled 2020-05-11: qty 20

## 2020-05-11 MED ORDER — PANTOPRAZOLE SODIUM 40 MG PO TBEC
40.0000 mg | DELAYED_RELEASE_TABLET | Freq: Every day | ORAL | Status: DC
  Administered 2020-05-11 – 2020-05-12 (×2): 40 mg via ORAL
  Filled 2020-05-11 (×2): qty 1

## 2020-05-11 MED ORDER — RIVAROXABAN 15 MG PO TABS
15.0000 mg | ORAL_TABLET | Freq: Two times a day (BID) | ORAL | Status: DC
  Administered 2020-05-11 – 2020-05-12 (×2): 15 mg via ORAL
  Filled 2020-05-11 (×2): qty 1

## 2020-05-11 MED ORDER — FUROSEMIDE 40 MG PO TABS
40.0000 mg | ORAL_TABLET | Freq: Every day | ORAL | Status: DC
  Administered 2020-05-11 – 2020-05-12 (×2): 40 mg via ORAL
  Filled 2020-05-11 (×2): qty 1

## 2020-05-11 MED ORDER — RIVAROXABAN 20 MG PO TABS: 20.0000 mg | ORAL_TABLET | Freq: Every day | ORAL | Status: DC

## 2020-05-11 NOTE — Progress Notes (Signed)
Pt off unit to Paracentesis.

## 2020-05-11 NOTE — Progress Notes (Signed)
ANTICOAGULATION CONSULT NOTE - Initial Consult  Pharmacy Consult for Xarelto Indication: DVT  Allergies  Allergen Reactions   Aspirin Other (See Comments)    Runny nose    Codeine Nausea And Vomiting    Patient Measurements: Height: 5\' 7"  (170.2 cm) Weight: 102.3 kg (225 lb 8.5 oz) IBW/kg (Calculated) : 61.6   Vital Signs: Temp: 97.8 F (36.6 C) (10/27 1430) Temp Source: Oral (10/27 1430) BP: 160/66 (10/27 1430) Pulse Rate: 78 (10/27 1430)  Labs: Recent Labs    05/10/20 0839 05/10/20 1101 05/11/20 0422  HGB 10.5*  --   --   HCT 33.8*  --   --   PLT 141*  --   --   CREATININE 1.02*  --  1.06*  TROPONINIHS 8 8  --     Estimated Creatinine Clearance: 53.8 mL/min (A) (by C-G formula based on SCr of 1.06 mg/dL (H)).   Medical History: Past Medical History:  Diagnosis Date   Allergy    Anemia    hx of   Anxiety    Arthritis    Colon cancer ([place]) dx'd 11/2014   Diabetes mellitus without complication (HCC)    TYPE II   Fatty liver    Gallbladder disease    GERD (gastroesophageal reflux disease)    History of gout    Hypertension    Hypothyroidism    Kidney disorder    spot on left kidney   Osteoporosis    PONV (postoperative nausea and vomiting)    Assessment: 79 y/o F with admitted with shortness of breath. CTA negative for PE, however LE dopplers positive for VTE. Patient does have a history of anemia of chronic disease. Last dose of SQH this AM at 0700. Hgb at baseline   Plan:  Will initiate Xarelto 15 mg bid x 21 days followed by 20 mg daily thereafter.  Will educate patient closer to discharge.  Will continue to follow remotely.  Thank you for the consult.   Ulice Dash D 05/11/2020,6:30 PM

## 2020-05-11 NOTE — Progress Notes (Signed)
PROGRESS NOTE    Cheryl Jordan  OZH:[id number] DOB: 1941/05/16 DOA: 05/10/2020 PCP: Brunetta Jeans, PA-C    Brief Narrative:  79 year old female with history of chronic anemia of chronic disease, type 2 diabetes on insulin, GERD, history of colon cancer status post colon resection and chemotherapy, hypothyroidism, liver cirrhosis presented to the ER with shortness of breath and cough.  Recently admitted to the hospital with norovirus diarrhea. In the emergency room, blood pressure is elevated.  Otherwise is stable.  On room air.  CTA done in the ER essentially normal and negative for PE.  Assessment & Plan:   Principal Problem:   SOB (shortness of breath) Active Problems:   Hypothyroidism   GERD (gastroesophageal reflux disease)   Adenocarcinoma of colon (HCC)   Chronic kidney disease (CKD), stage III (moderate) (HCC)   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HCC)   Anemia  Shortness of breath: Likely related to ascites, hypoalbuminemia and third spacing.  No primary pulmonary issue. -Discontinue steroids, no benefit. -Symptomatic treatment.  Will start low-dose Lasix. -Recent normal echocardiogram. -We will benefit with paracentesis and diagnostic testing. -CTA negative for PE.  Lower extremity duplex is pending.  D-dimer was poor.  Cirrhosis of liver/abnormal liver lesion/abdominal distention: -Sent for paracentesis. -3 L removed, fluid analysis and cytology pending.  Likely transudate. -We will start on Lasix to see response. -She does have outpatient follow-up with her gastroenterologist and known to have hepatic lesion and cirrhosis with normal LFTs.  Chronic kidney disease stage IIIa: -Fairly stable at baseline.  Hypothyroidism: -Euthyroid on Synthroid.  Continue same doses.  GERD: -On Prilosec at home.  Continue Protonix.  Type 2 diabetes: Well controlled.  On glipizide at home.  Currently remains on sliding scale insulin.   DVT prophylaxis: heparin injection  5,000 Units Start: 05/10/20 2300 SCDs Start: 05/10/20 1606   Code Status: Full code Family Communication: None Disposition Plan: Status is: Inpatient  Remains inpatient appropriate because:Ongoing diagnostic testing needed not appropriate for outpatient work up   Dispo: The patient is from: Home              Anticipated d/c is to: Home              Anticipated d/c date is: 2 days              Patient currently is not medically stable to d/c.         Consultants:   None  Procedures:   Paracentesis  Antimicrobials:   None   Subjective: Patient seen and examined.  No overnight events.  She has dry cough.  Denies any chest pain.  Short of breath on mobility.  Her abdomen feels bloated, however this has been ongoing for some time now.  Bowel movements are normal.  Objective: Vitals:   05/11/20 1232 05/11/20 1247 05/11/20 1257 05/11/20 1430  BP: (!) 176/78 (!) 169/73 (!) 159/71 (!) 160/66  Pulse:    78  Resp:    18  Temp:    97.8 F (36.6 C)  TempSrc:    Oral  SpO2:    95%  Weight:      Height:        Intake/Output Summary (Last 24 hours) at 05/11/2020 1434 Last data filed at 05/11/2020 1037 Gross per 24 hour  Intake 240 ml  Output --  Net 240 ml   Filed Weights   05/10/20 0835 05/11/20 0500  Weight: 98.9 kg 102.3 kg    Examination:  General exam: Appears calm and comfortable  On room air at rest.  Not in any distress. Respiratory system: Clear to auscultation. Respiratory effort normal.  Mostly clear. Cardiovascular system: S1 & S2 heard, RRR. No JVD, murmurs, rubs, gallops or clicks.  1+ bilateral pedal edema. Gastrointestinal system: Abdomen is soft, distended.  No fluid thrill present.  Bowel sounds present. Central nervous system: Alert and oriented. No focal neurological deficits. Extremities: Symmetric 5 x 5 power. Skin: No rashes, lesions or ulcers Psychiatry: Judgement and insight appear normal. Mood & affect appropriate.     Data  Reviewed: I have personally reviewed following labs and imaging studies  CBC: Recent Labs  Lab 05/10/20 0839  WBC 7.7  HGB 10.5*  HCT 33.8*  MCV 92.9  PLT 884*   Basic Metabolic Panel: Recent Labs  Lab 05/10/20 0839 05/11/20 0422  NA 140 137  K 4.4 4.9  CL 105 103  CO2 23 21*  GLUCOSE 163* 235*  BUN 11 14  CREATININE 1.02* 1.06*  CALCIUM 8.6* 8.7*   GFR: Estimated Creatinine Clearance: 53.8 mL/min (A) (by C-G formula based on SCr of 1.06 mg/dL (H)). Liver Function Tests: No results for input(s): AST, ALT, ALKPHOS, BILITOT, PROT, ALBUMIN in the last 168 hours. No results for input(s): LIPASE, AMYLASE in the last 168 hours. No results for input(s): AMMONIA in the last 168 hours. Coagulation Profile: No results for input(s): INR, PROTIME in the last 168 hours. Cardiac Enzymes: No results for input(s): CKTOTAL, CKMB, CKMBINDEX, TROPONINI in the last 168 hours. BNP (last 3 results) Recent Labs    03/30/20 1507  PROBNP 250   HbA1C: No results for input(s): HGBA1C in the last 72 hours. CBG: No results for input(s): GLUCAP in the last 168 hours. Lipid Profile: No results for input(s): CHOL, HDL, LDLCALC, TRIG, CHOLHDL, LDLDIRECT in the last 72 hours. Thyroid Function Tests: No results for input(s): TSH, T4TOTAL, FREET4, T3FREE, THYROIDAB in the last 72 hours. Anemia Panel: No results for input(s): VITAMINB12, FOLATE, FERRITIN, TIBC, IRON, RETICCTPCT in the last 72 hours. Sepsis Labs: No results for input(s): PROCALCITON, LATICACIDVEN in the last 168 hours.  Recent Results (from the past 240 hour(s))  Respiratory Panel by RT PCR (Flu A&B, Covid) - Nasopharyngeal Swab     Status: None   Collection Time: 05/10/20 12:52 PM   Specimen: Nasopharyngeal Swab  Result Value Ref Range Status   SARS Coronavirus 2 by RT PCR NEGATIVE NEGATIVE Final    Comment: (NOTE) SARS-CoV-2 target nucleic acids are NOT DETECTED.  The SARS-CoV-2 RNA is generally detectable in upper  respiratoy specimens during the acute phase of infection. The lowest concentration of SARS-CoV-2 viral copies this assay can detect is 131 copies/mL. A negative result does not preclude SARS-Cov-2 infection and should not be used as the sole basis for treatment or other patient management decisions. A negative result may occur with  improper specimen collection/handling, submission of specimen other than nasopharyngeal swab, presence of viral mutation(s) within the areas targeted by this assay, and inadequate number of viral copies (<131 copies/mL). A negative result must be combined with clinical observations, patient history, and epidemiological information. The expected result is Negative.  Fact Sheet for Patients:  PinkCheek.be  Fact Sheet for Healthcare Providers:  GravelBags.it  This test is no t yet approved or cleared by the [place] FDA and  has been authorized for detection and/or diagnosis of SARS-CoV-2 by FDA under an Emergency Use Authorization (EUA). This EUA will remain  in effect (  meaning this test can be used) for the duration of the COVID-19 declaration under Section 564(b)(1) of the Act, 21 U.S.C. section 360bbb-3(b)(1), unless the authorization is terminated or revoked sooner.     Influenza A by PCR NEGATIVE NEGATIVE Final   Influenza B by PCR NEGATIVE NEGATIVE Final    Comment: (NOTE) The Xpert Xpress SARS-CoV-2/FLU/RSV assay is intended as an aid in  the diagnosis of influenza from Nasopharyngeal swab specimens and  should not be used as a sole basis for treatment. Nasal washings and  aspirates are unacceptable for Xpert Xpress SARS-CoV-2/FLU/RSV  testing.  Fact Sheet for Patients: PinkCheek.be  Fact Sheet for Healthcare Providers: GravelBags.it  This test is not yet approved or cleared by the [place] FDA and  has been  authorized for detection and/or diagnosis of SARS-CoV-2 by  FDA under an Emergency Use Authorization (EUA). This EUA will remain  in effect (meaning this test can be used) for the duration of the  Covid-19 declaration under Section 564(b)(1) of the Act, 21  U.S.C. section 360bbb-3(b)(1), unless the authorization is  terminated or revoked. Performed at [facility], [place] [address] [place], [place] [address]          Radiology Studies: DG Chest 2 View  Result Date: 05/10/2020 CLINICAL DATA:  Chest pain shortness of breath. EXAM: CHEST - 2 VIEW COMPARISON:  Multiple priors, most recent 04/08/2020. FINDINGS: Mild enlargement the cardiac silhouette. The previously seen linear densities in left lateral midlung appears more nodular on today's exam. Pulmonary vascular congestion small bilateral pleural effusions. No discernible pneumothorax. The visualized skeletal structures are unremarkable. IMPRESSION: 1. The previously seen linear densities in left lateral midlung appears more nodular on today's exam. While this could represent pneumonia, underlying pulmonary nodule is not excluded. Recommend short interval follow-up chest radiograph to ensure resolution or chest CT to further evaluate. 2. Mild cardiomegaly, pulmonary vascular congestion and small bilateral pleural effusions. Findings and recommendations discussed with Dr. Alvino Chapel at 9:43 AM via telephone. Electronically Signed   By: Margaretha Sheffield MD   On: 05/10/2020 09:45   CT Angio Chest PE W and/or Wo Contrast  Result Date: 05/10/2020 CLINICAL DATA:  Chest pain and shortness of breath EXAM: CT ANGIOGRAPHY CHEST WITH CONTRAST TECHNIQUE: Multidetector CT imaging of the chest was performed using the standard protocol during bolus administration of intravenous contrast. Multiplanar CT image reconstructions and MIPs were obtained to evaluate the vascular anatomy. CONTRAST:  172mL OMNIPAQUE IOHEXOL 350 MG/ML SOLN  COMPARISON:  2019 FINDINGS: Cardiovascular: Poor contrast bolus timing and respiratory artifact limit evaluation of the pulmonary arteries beyond the central and proximal lobar levels. No embolism is identified. Mild calcified plaque along the thoracic aorta. Mild cardiomegaly with left atrial enlargement. No pericardial effusion. Mediastinum/Nodes: No enlarged lymph nodes. Thyroid and esophagus are unremarkable. Lungs/Pleura: Small left and trace right pleural effusions. Mild bibasilar atelectasis. No pneumothorax. Upper Abdomen: Cirrhosis with ascites. Right hepatic lobe lesion is poorly evaluated on this study. Musculoskeletal: No acute osseous abnormality. Review of the MIP images confirms the above findings. IMPRESSION: Suboptimal evaluation for pulmonary embolism due to contrast bolus timing and respiratory motion. No acute pulmonary embolism within the central and some of the proximal lobar arteries. Small left and trace right pleural effusions. Mild bibasilar atelectasis. Cirrhosis with ascites. Electronically Signed   By: Macy Mis M.D.   On: 05/10/2020 11:17   US Paracentesis  Result Date: 05/11/2020 INDICATION: Patient with a recent diagnosis of cirrhosis and new onset ascites.  Interventional radiology asked to perform a therapeutic and diagnostic paracentesis. EXAM: ULTRASOUND GUIDED PARACENTESIS MEDICATIONS: 1% lidocaine 10 mL COMPLICATIONS: None immediate. PROCEDURE: Informed written consent was obtained from the patient after a discussion of the risks, benefits and alternatives to treatment. A timeout was performed prior to the initiation of the procedure. Initial ultrasound scanning demonstrates a large amount of ascites within the right lower abdominal quadrant. The right lower abdomen was prepped and draped in the usual sterile fashion. 1% lidocaine was used for local anesthesia. Following this, a 19 gauge, 7-cm, Yueh catheter was introduced. An ultrasound image was saved for  documentation purposes. The paracentesis was performed. The catheter was removed and a dressing was applied. The patient tolerated the procedure well without immediate post procedural complication. FINDINGS: A total of approximately 3.2 L of clear yellow fluid was removed. Samples were sent to the laboratory as requested by the clinical team. IMPRESSION: Successful ultrasound-guided paracentesis yielding 3.2 liters of peritoneal fluid. Read by: Soyla Dryer, NP Electronically Signed   By: Lucrezia Europe M.D.   On: 05/11/2020 13:57        Scheduled Meds: . amLODipine  2.5 mg Oral Daily  . atenolol  50 mg Oral q morning - 10a  . benzonatate  100 mg Oral TID  . furosemide  40 mg Oral Daily  . heparin  5,000 Units Subcutaneous Q8H  . irbesartan  75 mg Oral Daily  . levothyroxine  88 mcg Oral Q0600  . lidocaine      . pantoprazole  40 mg Oral Daily  . sodium chloride flush  3 mL Intravenous Q12H   Continuous Infusions: . sodium chloride       LOS: 1 day    Time spent: 30 minutes     Barb Merino, MD [facility] Pager [phone]

## 2020-05-11 NOTE — Evaluation (Signed)
Physical Therapy Evaluation Patient Details Name: Cheryl Jordan MRN: [id number] DOB: 17-Mar-1941 Today's Date: 05/11/2020   History of Present Illness  79 yo female admitted with dyspnea, chest pain. Hx of DM, osteoporosis, colon ca, anemia, obesity  Clinical Impression  On eval, pt was Min guard assist for mobility. She walked ~100 feet around the unit. Mildly unsteady-requires 1 UE support for safety/stability. O2 93% on RA but pt easily fatigues and gets dyspneic with ambulation. Will plan to follow and progress activity as tolerated. Will recommend HHPT f/u, if pt is agreeable.     Follow Up Recommendations Home health PT;Supervision - Intermittent    Equipment Recommendations  None recommended by PT    Recommendations for Other Services       Precautions / Restrictions Precautions Precautions: Fall Restrictions Weight Bearing Restrictions: No      Mobility  Bed Mobility               General bed mobility comments: sitting EOB    Transfers Overall transfer level: Needs assistance Equipment used: None Transfers: Sit to/from Stand Sit to Stand: Min guard            Ambulation/Gait Ambulation/Gait assistance: Min assist Gait Distance (Feet): 100 Feet Assistive device:  (hallway handrail vs "cruising/furniture walking") Gait Pattern/deviations: Step-through pattern;Decreased stride length     General Gait Details: slow gait speed. dyspnea 2/4. o2 93% on ra. 1 standing rest break after ~50 feet.  Stairs            Wheelchair Mobility    Modified Rankin (Stroke Patients Only)       Balance Overall balance assessment: Needs assistance           Standing balance-Leahy Scale: Fair                               Pertinent Vitals/Pain Pain Assessment: No/denies pain    Home Living Family/patient expects to be discharged to:: Private residence   Available Help at Discharge: Family Type of Home: House Home Access: Stairs to  enter Entrance Stairs-Rails: None Entrance Stairs-Number of Steps: 1-2 Home Layout: One level;Laundry or work area in [place]: Kasandra Knudsen - single point;Walker - 2 wheels      Prior Function Level of Independence: Independent with assistive device(s)               Hand Dominance        Extremity/Trunk Assessment   Upper Extremity Assessment Upper Extremity Assessment: Overall WFL for tasks assessed    Lower Extremity Assessment Lower Extremity Assessment: Generalized weakness    Cervical / Trunk Assessment Cervical / Trunk Assessment: Normal  Communication   Communication: No difficulties  Cognition Arousal/Alertness: Awake/alert Behavior During Therapy: WFL for tasks assessed/performed Overall Cognitive Status: Within Functional Limits for tasks assessed                                        General Comments      Exercises     Assessment/Plan    PT Assessment Patient needs continued PT services  PT Problem List Decreased strength;Decreased mobility;Decreased activity tolerance;Decreased balance       PT Treatment Interventions DME instruction;Gait training;Therapeutic activities;Therapeutic exercise;Patient/family education;Balance training;Functional mobility training    PT Goals (Current goals can be found in the Care Plan section)  Acute  Rehab PT Goals Patient Stated Goal: to breathe better PT Goal Formulation: With patient Time For Goal Achievement: 05/25/20 Potential to Achieve Goals: Good    Frequency Min 3X/week   Barriers to discharge        Co-evaluation               AM-PAC PT "6 Clicks" Mobility  Outcome Measure Help needed turning from your back to your side while in a flat bed without using bedrails?: None Help needed moving from lying on your back to sitting on the side of a flat bed without using bedrails?: None Help needed moving to and from a bed to a chair (including a wheelchair)?: A  Little Help needed standing up from a chair using your arms (e.g., wheelchair or bedside chair)?: A Little Help needed to walk in hospital room?: A Little Help needed climbing 3-5 steps with a railing? : A Little 6 Click Score: 20    End of Session Equipment Utilized During Treatment: Gait belt Activity Tolerance: Patient tolerated treatment well;Patient limited by fatigue Patient left: in bed;with call bell/phone within reach   PT Visit Diagnosis: Unsteadiness on feet (R26.81);Muscle weakness (generalized) (M62.81);Difficulty in walking, not elsewhere classified (R26.2)    Time: 1052-1100 PT Time Calculation (min) (ACUTE ONLY): 8 min   Charges:   PT Evaluation $PT Eval Low Complexity: [place], PT Acute Rehabilitation  Office: [phone] Pager: [phone]

## 2020-05-11 NOTE — Discharge Instructions (Signed)
Information on my medicine - XARELTO (rivaroxaban)  This medication education was reviewed with me or my healthcare representative as part of my discharge preparation.  The pharmacist that spoke with me during my hospital stay was:    WHY WAS XARELTO PRESCRIBED FOR YOU? Xarelto was prescribed to treat blood clots that may have been found in the veins of your legs (deep vein thrombosis) or in your lungs (pulmonary embolism) and to reduce the risk of them occurring again.  What do you need to know about Xarelto? The starting dose is one 15 mg tablet taken TWICE daily with food for the FIRST 21 DAYS then on 11/17  the dose is changed to one 20 mg tablet taken ONCE A DAY with your evening meal.  DO NOT stop taking Xarelto without talking to the health care provider who prescribed the medication.  Refill your prescription for 20 mg tablets before you run out.  After discharge, you should have regular check-up appointments with your healthcare provider that is prescribing your Xarelto.  In the future your dose may need to be changed if your kidney function changes by a significant amount.  What do you do if you miss a dose? If you are taking Xarelto TWICE DAILY and you miss a dose, take it as soon as you remember. You may take two 15 mg tablets (total 30 mg) at the same time then resume your regularly scheduled 15 mg twice daily the next day.  If you are taking Xarelto ONCE DAILY and you miss a dose, take it as soon as you remember on the same day then continue your regularly scheduled once daily regimen the next day. Do not take two doses of Xarelto at the same time.   Important Safety Information Xarelto is a blood thinner medicine that can cause bleeding. You should call your healthcare provider right away if you experience any of the following: ? Bleeding from an injury or your nose that does not stop. ? Unusual colored urine (red or dark brown) or unusual colored stools (red or  black). ? Unusual bruising for unknown reasons. ? A serious fall or if you hit your head (even if there is no bleeding).  Some medicines may interact with Xarelto and might increase your risk of bleeding while on Xarelto. To help avoid this, consult your healthcare provider or pharmacist prior to using any new prescription or non-prescription medications, including herbals, vitamins, non-steroidal anti-inflammatory drugs (NSAIDs) and supplements.  This website has more information on Xarelto: [email or web address]-benson.[email or web address]

## 2020-05-11 NOTE — TOC Progression Note (Signed)
Transition of Care [facility]) - Progression Note    Patient Details  Name: Cheryl Jordan MRN: [id number] Date of Birth: 03-16-1941  [facility]) CM/SW Contact  Purcell Mouton, RN Phone Number: 05/11/2020, 3:48 PM  Clinical Narrative:     Spoke with pt concerning discharge plan. Pt states that she can use her walker at home if need be. Pt do not want Home Health PT at present time.   Expected Discharge Plan: [place] Barriers to Discharge: No Barriers Identified  Expected Discharge Plan and Services Expected Discharge Plan: [place] arrangements for the past 2 months: Single Family Home Expected Discharge Date:  (unknown)                                     Social Determinants of Health (SDOH) Interventions    Readmission Risk Interventions Readmission Risk Prevention Plan 05/11/2020  Transportation Screening Complete  PCP or Specialist Appt within 5-7 Days Complete  Home Care Screening Complete  Some recent data might be hidden

## 2020-05-11 NOTE — Progress Notes (Signed)
Bilateral lower extremity venous duplex completed. Refer to "CV Proc" under chart review to view preliminary results.  Preliminary results discussed with Dr. Sloan Leiter.  05/11/2020 4:10 PM Kelby Aline., MHA, RVT, RDCS, RDMS

## 2020-05-11 NOTE — Procedures (Signed)
PROCEDURE SUMMARY:  Successful US guided paracentesis from right abdomen  Yielded 3.2 L of clear yellow fluid.  No immediate complications.  Pt tolerated well.   Specimen sent for labs.  EBL < 2 mL  Theresa Duty, NP 05/11/2020 1:58 PM

## 2020-05-12 ENCOUNTER — Other Ambulatory Visit (HOSPITAL_COMMUNITY): Payer: Self-pay | Admitting: Internal Medicine

## 2020-05-12 ENCOUNTER — Telehealth: Payer: Self-pay

## 2020-05-12 LAB — MAGNESIUM: Magnesium: 1.9 mg/dL (ref 1.7–2.4)

## 2020-05-12 LAB — CBC WITH DIFFERENTIAL/PLATELET
Abs Immature Granulocytes: 0.02 10*3/uL (ref 0.00–0.07)
Basophils Absolute: 0 10*3/uL (ref 0.0–0.1)
Basophils Relative: 0 %
Eosinophils Absolute: 0 10*3/uL (ref 0.0–0.5)
Eosinophils Relative: 0 %
HCT: 32.6 % — ABNORMAL LOW (ref 36.0–46.0)
Hemoglobin: 10.3 g/dL — ABNORMAL LOW (ref 12.0–15.0)
Immature Granulocytes: 0 %
Lymphocytes Relative: 12 %
Lymphs Abs: 1.1 10*3/uL (ref 0.7–4.0)
MCH: 29.4 pg (ref 26.0–34.0)
MCHC: 31.6 g/dL (ref 30.0–36.0)
MCV: 93.1 fL (ref 80.0–100.0)
Monocytes Absolute: 0.4 10*3/uL (ref 0.1–1.0)
Monocytes Relative: 4 %
Neutro Abs: 7.8 10*3/uL — ABNORMAL HIGH (ref 1.7–7.7)
Neutrophils Relative %: 84 %
Platelets: 145 10*3/uL — ABNORMAL LOW (ref 150–400)
RBC: 3.5 MIL/uL — ABNORMAL LOW (ref 3.87–5.11)
RDW: 14.3 % (ref 11.5–15.5)
WBC: 9.3 10*3/uL (ref 4.0–10.5)
nRBC: 0 % (ref 0.0–0.2)

## 2020-05-12 LAB — COMPREHENSIVE METABOLIC PANEL
ALT: 20 U/L (ref 0–44)
AST: 27 U/L (ref 15–41)
Albumin: 2.5 g/dL — ABNORMAL LOW (ref 3.5–5.0)
Alkaline Phosphatase: 80 U/L (ref 38–126)
Anion gap: 10 (ref 5–15)
BUN: 25 mg/dL — ABNORMAL HIGH (ref 8–23)
CO2: 23 mmol/L (ref 22–32)
Calcium: 8.6 mg/dL — ABNORMAL LOW (ref 8.9–10.3)
Chloride: 105 mmol/L (ref 98–111)
Creatinine, Ser: 1.15 mg/dL — ABNORMAL HIGH (ref 0.44–1.00)
GFR, Estimated: 49 mL/min — ABNORMAL LOW (ref >60)
Glucose, Bld: 216 mg/dL — ABNORMAL HIGH (ref 70–99)
Potassium: 4 mmol/L (ref 3.5–5.1)
Sodium: 138 mmol/L (ref 135–145)
Total Bilirubin: 1.2 mg/dL (ref 0.3–1.2)
Total Protein: 5.7 g/dL — ABNORMAL LOW (ref 6.5–8.1)

## 2020-05-12 LAB — CYTOLOGY - NON PAP

## 2020-05-12 LAB — PHOSPHORUS: Phosphorus: 3.6 mg/dL (ref 2.5–4.6)

## 2020-05-12 MED ORDER — FUROSEMIDE 20 MG PO TABS: 20.0000 mg | ORAL_TABLET | Freq: Every day | ORAL | 0 refills | Status: DC

## 2020-05-12 MED ORDER — RIVAROXABAN (XARELTO) VTE STARTER PACK (15 & 20 MG)
ORAL_TABLET | ORAL | 0 refills | Status: DC
Start: 2020-05-12 — End: 2020-05-12

## 2020-05-12 MED ORDER — GUAIFENESIN 100 MG/5ML PO SOLN: 10.0000 mL | ORAL | 0 refills | Status: DC | PRN

## 2020-05-12 MED ORDER — GUAIFENESIN 100 MG/5ML PO SOLN
10.0000 mL | ORAL | Status: DC | PRN
  Administered 2020-05-12: 200 mg via ORAL
  Filled 2020-05-12: qty 10

## 2020-05-12 MED ORDER — RIVAROXABAN (XARELTO) VTE STARTER PACK (15 & 20 MG)
ORAL_TABLET | ORAL | 0 refills | Status: DC
Start: 2020-05-12 — End: 2020-06-06

## 2020-05-12 MED FILL — XARELTO STARTER PACK: 15 & 20 | 30 days supply | Qty: 51 | Fill #0

## 2020-05-12 NOTE — Telephone Encounter (Signed)
Called [phone] HM & [phone] CELL no answer either call.  Neither phone had voice mail set up.  Unable to leave an message.  Will try to call later today. maw

## 2020-05-12 NOTE — Telephone Encounter (Signed)
See additional phone note. 

## 2020-05-12 NOTE — Telephone Encounter (Signed)
Appt cancelled, tried to reach pt on both phone numbers without success. Will try again. Staff notified of open appt.

## 2020-05-12 NOTE — Discharge Summary (Signed)
Physician Discharge Summary  Cheryl Jordan OPF:[id number] DOB: [birth year] DOA: 05/10/2020  PCP: Brunetta Jeans, PA-C  Admit date: 05/10/2020 Discharge date: 05/12/2020  Admitted From: Home Disposition: Home  Recommendations for Outpatient Follow-up:  1. Follow up with PCP in 1-2 weeks 2. Please obtain BMP/CBC in one week 3. Follow-up with gastroenterology as scheduled.  Home Health: Not applicable Equipment/Devices: Not needed  Discharge Condition: Stable CODE STATUS: Full code Diet recommendation: Low-salt diet, low-carb diet  Discharge summary: 79 year old female with history of anemia of chronic disease, type 2 diabetes on glipizide at home, GERD, history of colon cancer status post resection and chemotherapy, hypothyroidism, liver lesion that was proved benign, liver cirrhosis presented to ER with worsening shortness of breath and cough.  She was recently admitted to the hospital with norovirus induced diarrhea.  In the emergency room blood pressures were elevated.  CTA of the chest was negative for PE, it showed mild bilateral pleural effusion.  Her D-dimer was more than 4.  Shortness of breath/ ascites / acute DVT right LE Multifactorial related to ascites which is probably due to third spacing from hypoalbuminemia related to chronic liver disease.  There was no primary pulmonary disease present on CT scan or on clinical exam.  Her D-dimer was more than 4. CTA of the chest was negative for PE. Lower extremity ultrasound was positive for DVT right posterior vein. Underwent paracentesis that was consistent with transudate, 3 L removed.  Cytology pending. LFTs are essentially normal. Patient symptomatically improved with paracentesis. We will start patient on low-dose Lasix 20 mg daily with close monitoring of renal function. She will continue all other long-term medications. She has an isolated DVT on the right posterior tibial vein.  She has history of colon cancer and also has  decreased mobility.  She is high risk of propagation of blood clot.  Currently no contraindication to any anticoagulation. After education and counseling to the patient, will start patient on Xarelto. Patient is scheduled for colonoscopy next week, will update her gastroenterology office about rescheduling in 2 to 3 weeks as she will need Xarelto to be held to undergo colonoscopy.   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[place])   Anemia    Discharge Instructions  Discharge Instructions    Call MD for:  difficulty breathing, headache or visual disturbances   Complete by: As directed    Diet - low sodium heart healthy   Complete by: As directed    Diet Carb Modified   Complete by: As directed    Increase activity slowly   Complete by: As directed      Allergies as of 05/12/2020      Reactions   Aspirin Other (See Comments)   Runny nose    Codeine Nausea And Vomiting      Medication List    TAKE these medications   Accu-Chek Aviva device Use as instructed   Accu-Chek Aviva Plus test strip Generic drug: glucose blood   acetaminophen 500 MG tablet Commonly known as: TYLENOL Take 500 mg by mouth every 6 (six) hours as needed for moderate pain.   amLODipine 2.5 MG tablet Commonly known as: NORVASC Take 1 tablet (2.5 mg total) by mouth daily.   atenolol 50 MG tablet Commonly known  as: TENORMIN TAKE 1 TABLET BY MOUTH IN  THE MORNING What changed: when to take this   calcium citrate-vitamin D 315-200 MG-UNIT tablet Commonly known as: CITRACAL+D Take 1 tablet by mouth daily.   ferrous sulfate 325 (65 FE) MG EC tablet Take 1 tablet (325 mg total) by mouth 2 (two) times daily with a meal. What changed: when to take this   furosemide  20 MG tablet Commonly known as: LASIX Take 1 tablet (20 mg total) by mouth daily. Start taking on: May 13, 2020   glipiZIDE 5 MG 24 hr tablet Commonly known as: GLUCOTROL XL TAKE (1) TABLET DAILY WITH BREAKFAST. What changed: See the new instructions.   guaiFENesin 100 MG/5ML Soln Commonly known as: ROBITUSSIN Take 10 mLs (200 mg total) by mouth every 4 (four) hours as needed for cough or to loosen phlegm.   levothyroxine 88 MCG tablet Commonly known as: SYNTHROID Take 1 tablet (88 mcg total) by mouth daily.   loperamide 2 MG capsule Commonly known as: IMODIUM Take 2 capsules by mouth once. Then take additional 2mg  capsule as needed for continued diarrhea. No more than 16 mg in 24 hours. What changed:   how much to take  how to take this  when to take this  reasons to take this   LORazepam 0.5 MG tablet Commonly known as: ATIVAN Take 0.5 mg by mouth every 8 (eight) hours as needed for anxiety. for anxiety   omeprazole 20 MG capsule Commonly known as: PRILOSEC Take 1 capsule (20 mg total) by mouth daily.   ondansetron 4 MG disintegrating tablet Commonly known as: Zofran ODT Take 1 tablet (4 mg total) by mouth every 8 (eight) hours as needed for nausea or vomiting.   Rivaroxaban Stater Pack (15 mg and 20 mg) Commonly known as: XARELTO STARTER PACK Follow package directions: Take one 15mg  tablet by mouth twice a day. On day 22, switch to one 20mg  tablet once a day. Take with food.   telmisartan 40 MG tablet Commonly known as: MICARDIS Take 1 tablet (40 mg total) by mouth daily. What changed:   how much to take  when to take this       Follow-up Information    Brunetta Jeans, PA-C Follow up.   Specialty: Family Medicine Contact information: [address] [place] [address] [phone]              Allergies  Allergen Reactions  . Aspirin Other (See Comments)    Runny nose   . Codeine Nausea And Vomiting     Consultations:  None   Procedures/Studies: CT Abdomen Pelvis Wo Contrast  Result Date: 04/29/2020 CLINICAL DATA:  Acute upper abdominal pain and distention. EXAM: CT ABDOMEN AND PELVIS WITHOUT CONTRAST TECHNIQUE: Multidetector CT imaging of the abdomen and pelvis was performed following the standard protocol without IV contrast. COMPARISON:  February 06, 2016.  Dec 06, 2016. FINDINGS: Lower chest: Small left pleural effusion is noted. Mild bibasilar subsegmental atelectasis is noted. Hepatobiliary: Status post cholecystectomy. No biliary dilatation is noted. Nodular hepatic margins are noted consistent with hepatic cirrhosis. Mild amount of perihepatic ascites is noted. 2.8 x 2.5 cm complex hyperdense abnormality is noted in right hepatic lobe posteriorly which is enlarged compared to prior exam and concerning for hepatocellular carcinoma. Pancreas: Unremarkable. No pancreatic ductal dilatation or surrounding inflammatory changes. Spleen: Mild splenomegaly is noted with mild amount of perisplenic fluid. Adrenals/Urinary Tract: Adrenal glands appear normal. Stable bilateral renal cysts are  noted. No hydronephrosis or renal obstruction is noted. No renal or ureteral calculi are noted. Urinary bladder is unremarkable. Stomach/Bowel: Stomach is unremarkable. Status post right hemicolectomy. There is no evidence of bowel obstruction. [place] thickening and mild surrounding fluid is noted around the distal duodenum concerning for possible enteritis or peptic ulcer disease. Sigmoid diverticulosis is noted without inflammation. Vascular/Lymphatic: Aortic atherosclerosis. No enlarged abdominal or pelvic lymph nodes. Reproductive: Uterus and bilateral adnexa are unremarkable. Other: Mild amount of ascites is noted in the pelvis and both pericolic gutters. No hernia is noted. Musculoskeletal: No acute or significant osseous findings. IMPRESSION: 1. Findings consistent with hepatic cirrhosis with mild splenomegaly and  mild amount of ascites. 2. 2.8 x 2.5 cm complex hyperdense abnormality is noted in right hepatic lobe posteriorly which is enlarged compared to prior exam and concerning for hepatocellular carcinoma. Further evaluation with MRI with and without gadolinium administration is recommended. 3. [place] thickening of distal duodenum is noted with a mild amount of surrounding fluid concerning for enteritis or peptic ulcer disease. 4. Small left pleural effusion is noted with mild bibasilar subsegmental atelectasis. 5. Sigmoid diverticulosis without inflammation. 6. Aortic atherosclerosis. Aortic Atherosclerosis (ICD10-I70.0). Electronically Signed   By: Marijo Conception M.D.   On: 04/29/2020 11:08   DG Chest 2 View  Result Date: 05/10/2020 CLINICAL DATA:  Chest pain shortness of breath. EXAM: CHEST - 2 VIEW COMPARISON:  Multiple priors, most recent 04/08/2020. FINDINGS: Mild enlargement the cardiac silhouette. The previously seen linear densities in left lateral midlung appears more nodular on today's exam. Pulmonary vascular congestion small bilateral pleural effusions. No discernible pneumothorax. The visualized skeletal structures are unremarkable. IMPRESSION: 1. The previously seen linear densities in left lateral midlung appears more nodular on today's exam. While this could represent pneumonia, underlying pulmonary nodule is not excluded. Recommend short interval follow-up chest radiograph to ensure resolution or chest CT to further evaluate. 2. Mild cardiomegaly, pulmonary vascular congestion and small bilateral pleural effusions. Findings and recommendations discussed with Dr. Alvino Chapel at 9:43 AM via telephone. Electronically Signed   By: Margaretha Sheffield MD   On: 05/10/2020 09:45   CT Angio Chest PE W and/or Wo Contrast  Result Date: 05/10/2020 CLINICAL DATA:  Chest pain and shortness of breath EXAM: CT ANGIOGRAPHY CHEST WITH CONTRAST TECHNIQUE: Multidetector CT imaging of the chest was performed using the  standard protocol during bolus administration of intravenous contrast. Multiplanar CT image reconstructions and MIPs were obtained to evaluate the vascular anatomy. CONTRAST:  139mL OMNIPAQUE IOHEXOL 350 MG/ML SOLN COMPARISON:  2019 FINDINGS: Cardiovascular: Poor contrast bolus timing and respiratory artifact limit evaluation of the pulmonary arteries beyond the central and proximal lobar levels. No embolism is identified. Mild calcified plaque along the thoracic aorta. Mild cardiomegaly with left atrial enlargement. No pericardial effusion. Mediastinum/Nodes: No enlarged lymph nodes. Thyroid and esophagus are unremarkable. Lungs/Pleura: Small left and trace right pleural effusions. Mild bibasilar atelectasis. No pneumothorax. Upper Abdomen: Cirrhosis with ascites. Right hepatic lobe lesion is poorly evaluated on this study. Musculoskeletal: No acute osseous abnormality. Review of the MIP images confirms the above findings. IMPRESSION: Suboptimal evaluation for pulmonary embolism due to contrast bolus timing and respiratory motion. No acute pulmonary embolism within the central and some of the proximal lobar arteries. Small left and trace right pleural effusions. Mild bibasilar atelectasis. Cirrhosis with ascites. Electronically Signed   By: Macy Mis M.D.   On: 05/10/2020 11:17   US Paracentesis  Result Date: 05/11/2020 INDICATION: Patient with a recent diagnosis of  cirrhosis and new onset ascites. Interventional radiology asked to perform a therapeutic and diagnostic paracentesis. EXAM: ULTRASOUND GUIDED PARACENTESIS MEDICATIONS: 1% lidocaine 10 mL COMPLICATIONS: None immediate. PROCEDURE: Informed written consent was obtained from the patient after a discussion of the risks, benefits and alternatives to treatment. A timeout was performed prior to the initiation of the procedure. Initial ultrasound scanning demonstrates a large amount of ascites within the right lower abdominal quadrant. The right lower  abdomen was prepped and draped in the usual sterile fashion. 1% lidocaine was used for local anesthesia. Following this, a 19 gauge, 7-cm, Yueh catheter was introduced. An ultrasound image was saved for documentation purposes. The paracentesis was performed. The catheter was removed and a dressing was applied. The patient tolerated the procedure well without immediate post procedural complication. FINDINGS: A total of approximately 3.2 L of clear yellow fluid was removed. Samples were sent to the laboratory as requested by the clinical team. IMPRESSION: Successful ultrasound-guided paracentesis yielding 3.2 liters of peritoneal fluid. Read by: Soyla Dryer, NP Electronically Signed   By: Lucrezia Europe M.D.   On: 05/11/2020 13:57   VAS [place] LOWER EXTREMITY VENOUS (DVT)  Result Date: 05/11/2020  Lower Venous DVTStudy Indications: Elevated d-dimer=4.16.  Comparison Study: No prior study Performing Technologist: Maudry Mayhew MHA, RDMS, RVT, RDCS  Examination Guidelines: A complete evaluation includes B-mode imaging, spectral Doppler, color Doppler, and power Doppler as needed of all accessible portions of each vessel. Bilateral testing is considered an integral part of a complete examination. Limited examinations for reoccurring indications may be performed as noted. The reflux portion of the exam is performed with the patient in reverse Trendelenburg.  +---------+---------------+---------+-----------+----------+--------------+ RIGHT    CompressibilityPhasicitySpontaneityPropertiesThrombus Aging +---------+---------------+---------+-----------+----------+--------------+ CFV      Full           Yes      Yes                                 +---------+---------------+---------+-----------+----------+--------------+ SFJ      Full                                                        +---------+---------------+---------+-----------+----------+--------------+ FV Prox  Full                                                         +---------+---------------+---------+-----------+----------+--------------+ FV Mid   Full                                                        +---------+---------------+---------+-----------+----------+--------------+ FV DistalFull                                                        +---------+---------------+---------+-----------+----------+--------------+ PFV      Full                                                        +---------+---------------+---------+-----------+----------+--------------+  POP      Full           Yes      Yes                                 +---------+---------------+---------+-----------+----------+--------------+ PTV      None                    No                   Acute          +---------+---------------+---------+-----------+----------+--------------+ PERO     Full                                                        +---------+---------------+---------+-----------+----------+--------------+   +---------+---------------+---------+-----------+----------+--------------+ LEFT     CompressibilityPhasicitySpontaneityPropertiesThrombus Aging +---------+---------------+---------+-----------+----------+--------------+ CFV      Full           Yes      Yes                                 +---------+---------------+---------+-----------+----------+--------------+ SFJ      Full                                                        +---------+---------------+---------+-----------+----------+--------------+ FV Prox  Full                                                        +---------+---------------+---------+-----------+----------+--------------+ FV Mid   Full                                                        +---------+---------------+---------+-----------+----------+--------------+ FV DistalFull                                                         +---------+---------------+---------+-----------+----------+--------------+ PFV      Full                                                        +---------+---------------+---------+-----------+----------+--------------+ POP      Full           Yes      Yes                                 +---------+---------------+---------+-----------+----------+--------------+  PTV      Full                                                        +---------+---------------+---------+-----------+----------+--------------+ PERO     Full                                                        +---------+---------------+---------+-----------+----------+--------------+     Summary: RIGHT: - Findings consistent with acute deep vein thrombosis involving the right posterior tibial veins. - No cystic structure found in the popliteal fossa.  LEFT: - There is no evidence of deep vein thrombosis in the lower extremity.  - No cystic structure found in the popliteal fossa.  *See table(s) above for measurements and observations. Electronically signed by Harold Barban MD on 05/11/2020 at 8:52:21 PM.    Final    (Echo, Carotid, EGD, Colonoscopy, ERCP)    Subjective: Patient seen and examined.  Has some cough and using Mucinex otherwise denies any complaints.  Denies any chest pain or shortness of breath.  Her belly feels soft after paracentesis.  Getting up and walking in the room, on room air.  Shortness of breath much improved since yesterday.   Discharge Exam: Vitals:   05/11/20 2037 05/12/20 0501  BP: (!) 157/65 (!) 160/61  Pulse: 76 68  Resp: 20 18  Temp: 97.9 F (36.6 C) 97.6 F (36.4 C)  SpO2: 90% 90%   Vitals:   05/11/20 1430 05/11/20 2037 05/12/20 0500 05/12/20 0501  BP: (!) 160/66 (!) 157/65  (!) 160/61  Pulse: 78 76  68  Resp: 18 20  18   Temp: 97.8 F (36.6 C) 97.9 F (36.6 C)  97.6 F (36.4 C)  TempSrc: Oral Oral  Oral  SpO2: 95% 90%  90%  Weight:   97.8 kg   Height:         General: Pt is alert, awake, not in acute distress Cardiovascular: RRR, S1/S2 +, no rubs, no gallops Respiratory: CTA bilaterally, no wheezing, no rhonchi Abdominal: Soft, NT, ND, bowel sounds + Extremities: no edema, no cyanosis    The results of significant diagnostics from this hospitalization (including imaging, microbiology, ancillary and laboratory) are listed below for reference.     Microbiology: Recent Results (from the past 240 hour(s))  Respiratory Panel by RT PCR (Flu A&B, Covid) - Nasopharyngeal Swab     Status: None   Collection Time: 05/10/20 12:52 PM   Specimen: Nasopharyngeal Swab  Result Value Ref Range Status   SARS Coronavirus 2 by RT PCR NEGATIVE NEGATIVE Final    Comment: (NOTE) SARS-CoV-2 target nucleic acids are NOT DETECTED.  The SARS-CoV-2 RNA is generally detectable in upper respiratoy specimens during the acute phase of infection. The lowest concentration of SARS-CoV-2 viral copies this assay can detect is 131 copies/mL. A negative result does not preclude SARS-Cov-2 infection and should not be used as the sole basis for treatment or other patient management decisions. A negative result may occur with  improper specimen collection/handling, submission of specimen other than nasopharyngeal swab, presence of viral mutation(s) within the areas targeted by this assay, and inadequate number of viral copies (<131  copies/mL). A negative result must be combined with clinical observations, patient history, and epidemiological information. The expected result is Negative.  Fact Sheet for Patients:  PinkCheek.be  Fact Sheet for Healthcare Providers:  GravelBags.it  This test is no t yet approved or cleared by the [place] FDA and  has been authorized for detection and/or diagnosis of SARS-CoV-2 by FDA under an Emergency Use Authorization (EUA). This EUA will remain  in effect (meaning this  test can be used) for the duration of the COVID-19 declaration under Section 564(b)(1) of the Act, 21 U.S.C. section 360bbb-3(b)(1), unless the authorization is terminated or revoked sooner.     Influenza A by PCR NEGATIVE NEGATIVE Final   Influenza B by PCR NEGATIVE NEGATIVE Final    Comment: (NOTE) The Xpert Xpress SARS-CoV-2/FLU/RSV assay is intended as an aid in  the diagnosis of influenza from Nasopharyngeal swab specimens and  should not be used as a sole basis for treatment. Nasal washings and  aspirates are unacceptable for Xpert Xpress SARS-CoV-2/FLU/RSV  testing.  Fact Sheet for Patients: PinkCheek.be  Fact Sheet for Healthcare Providers: GravelBags.it  This test is not yet approved or cleared by the [place] FDA and  has been authorized for detection and/or diagnosis of SARS-CoV-2 by  FDA under an Emergency Use Authorization (EUA). This EUA will remain  in effect (meaning this test can be used) for the duration of the  Covid-19 declaration under Section 564(b)(1) of the Act, 21  U.S.C. section 360bbb-3(b)(1), unless the authorization is  terminated or revoked. Performed at [facility], [place] [address], [place], [place] [address]   Aerobic/Anaerobic Culture (surgical/deep wound)     Status: None (Preliminary result)   Collection Time: 05/11/20  1:18 PM   Specimen: PATH Cytology Misc. fluid; Other  Result Value Ref Range Status   Specimen Description   Final    PERITONEAL Performed at [facility] [address] [place], [place] [address]    Special Requests   Final    NONE Performed at [facility], [place] [address], [place], [place] [address]    Gram Stain NO WBC SEEN NO ORGANISMS SEEN CYTOSPIN SMEAR   Final   Culture   Final    NO GROWTH < 24 HOURS Performed at [facility], [place] [address], [place], [place] [address]    Report  Status PENDING  Incomplete     Labs: BNP (last 3 results) No results for input(s): BNP in the last 8760 hours. Basic Metabolic Panel: Recent Labs  Lab 05/10/20 0839 05/11/20 0422 05/12/20 0339  NA 140 137 138  K 4.4 4.9 4.0  CL 105 103 105  CO2 23 21* 23  GLUCOSE 163* 235* 216*  BUN 11 14 25*  CREATININE 1.02* 1.06* 1.15*  CALCIUM 8.6* 8.7* 8.6*  MG  --   --  1.9  PHOS  --   --  3.6   Liver Function Tests: Recent Labs  Lab 05/12/20 0339  AST 27  ALT 20  ALKPHOS 80  BILITOT 1.2  PROT 5.7*  ALBUMIN 2.5*   No results for input(s): LIPASE, AMYLASE in the last 168 hours. No results for input(s): AMMONIA in the last 168 hours. CBC: Recent Labs  Lab 05/10/20 0839 05/12/20 0339  WBC 7.7 9.3  NEUTROABS  --  7.8*  HGB 10.5* 10.3*  HCT 33.8* 32.6*  MCV 92.9 93.1  PLT 141* 145*   Cardiac Enzymes: No results for input(s):  CKTOTAL, CKMB, CKMBINDEX, TROPONINI in the last 168 hours. BNP: Invalid input(s): POCBNP CBG: No results for input(s): GLUCAP in the last 168 hours. D-Dimer Recent Labs    05/10/20 0839  DDIMER 4.16*   Hgb A1c No results for input(s): HGBA1C in the last 72 hours. Lipid Profile No results for input(s): CHOL, HDL, LDLCALC, TRIG, CHOLHDL, LDLDIRECT in the last 72 hours. Thyroid function studies No results for input(s): TSH, T4TOTAL, T3FREE, THYROIDAB in the last 72 hours.  Invalid input(s): FREET3 Anemia work up No results for input(s): VITAMINB12, FOLATE, FERRITIN, TIBC, IRON, RETICCTPCT in the last 72 hours. Urinalysis    Component Value Date/Time   COLORURINE YELLOW 04/29/2020 2000   APPEARANCEUR CLEAR 04/29/2020 2000   LABSPEC 1.011 04/29/2020 2000   PHURINE 6.0 04/29/2020 2000   GLUCOSEU NEGATIVE 04/29/2020 2000   HGBUR NEGATIVE 04/29/2020 2000   BILIRUBINUR NEGATIVE 04/29/2020 2000   BILIRUBINUR Negative 12/04/2019 [place] 04/29/2020 2000   PROTEINUR NEGATIVE 04/29/2020 2000   UROBILINOGEN 0.2 12/04/2019 1352    UROBILINOGEN 0.2 02/07/2011 1832   NITRITE NEGATIVE 04/29/2020 2000   LEUKOCYTESUR 117 (A) 04/29/2020 2000   Sepsis Labs Invalid input(s): PROCALCITONIN,  WBC,  LACTICIDVEN Microbiology Recent Results (from the past 240 hour(s))  Respiratory Panel by RT PCR (Flu A&B, Covid) - Nasopharyngeal Swab     Status: None   Collection Time: 05/10/20 12:52 PM   Specimen: Nasopharyngeal Swab  Result Value Ref Range Status   SARS Coronavirus 2 by RT PCR NEGATIVE NEGATIVE Final    Comment: (NOTE) SARS-CoV-2 target nucleic acids are NOT DETECTED.  The SARS-CoV-2 RNA is generally detectable in upper respiratoy specimens during the acute phase of infection. The lowest concentration of SARS-CoV-2 viral copies this assay can detect is 131 copies/mL. A negative result does not preclude SARS-Cov-2 infection and should not be used as the sole basis for treatment or other patient management decisions. A negative result may occur with  improper specimen collection/handling, submission of specimen other than nasopharyngeal swab, presence of viral mutation(s) within the areas targeted by this assay, and inadequate number of viral copies (<131 copies/mL). A negative result must be combined with clinical observations, patient history, and epidemiological information. The expected result is Negative.  Fact Sheet for Patients:  PinkCheek.be  Fact Sheet for Healthcare Providers:  GravelBags.it  This test is no t yet approved or cleared by the [place] FDA and  has been authorized for detection and/or diagnosis of SARS-CoV-2 by FDA under an Emergency Use Authorization (EUA). This EUA will remain  in effect (meaning this test can be used) for the duration of the COVID-19 declaration under Section 564(b)(1) of the Act, 21 U.S.C. section 360bbb-3(b)(1), unless the authorization is terminated or revoked sooner.     Influenza A by PCR NEGATIVE  NEGATIVE Final   Influenza B by PCR NEGATIVE NEGATIVE Final    Comment: (NOTE) The Xpert Xpress SARS-CoV-2/FLU/RSV assay is intended as an aid in  the diagnosis of influenza from Nasopharyngeal swab specimens and  should not be used as a sole basis for treatment. Nasal washings and  aspirates are unacceptable for Xpert Xpress SARS-CoV-2/FLU/RSV  testing.  Fact Sheet for Patients: PinkCheek.be  Fact Sheet for Healthcare Providers: GravelBags.it  This test is not yet approved or cleared by the [place] FDA and  has been authorized for detection and/or diagnosis of SARS-CoV-2 by  FDA under an Emergency Use Authorization (EUA). This EUA will remain  in effect (meaning this test  can be used) for the duration of the  Covid-19 declaration under Section 564(b)(1) of the Act, 21  U.S.C. section 360bbb-3(b)(1), unless the authorization is  terminated or revoked. Performed at [facility], [place] [address] [place], [place] [address]   Aerobic/Anaerobic Culture (surgical/deep wound)     Status: None (Preliminary result)   Collection Time: 05/11/20  1:18 PM   Specimen: PATH Cytology Misc. fluid; Other  Result Value Ref Range Status   Specimen Description   Final    PERITONEAL Performed at [place] [address], [place], [place] [address]    Special Requests   Final    NONE Performed at [facility], [place] [address], [place], [place] [address]    Gram Stain NO WBC SEEN NO ORGANISMS SEEN CYTOSPIN SMEAR   Final   Culture   Final    NO GROWTH < 24 HOURS Performed at [facility], [place] [address] [place], [place] [address]    Report Status PENDING  Incomplete     Time coordinating discharge:  35 minutes  SIGNED:   Barb Merino, MD  [facility] 05/12/2020, 1:33 PM

## 2020-05-12 NOTE — Telephone Encounter (Signed)
Spoke with pt.  She understands we are cancelling her EGD/COLON for May 16, 2020 since she was just in the hospital and started on [place].  Pt was instructed to continue taking her iron until she sees Dr. Henrene Pastor.  I advised pt that the nurse in the office will call her to schedule an office appointment to see Dr. Henrene Pastor.  Procedure were already cancelled in the computer.  maw

## 2020-05-12 NOTE — Telephone Encounter (Signed)
I completely agree. The [place] staff brought her interval medical issues to my attention. Cancel her procedures. Make sure she takes iron daily. Routine office follow-up (nonurgent). Alert the staff that I will have openings, and her spot, that can be used. Thanks

## 2020-05-12 NOTE — Telephone Encounter (Signed)
Pt scheduled to see Dr. Henrene Pastor 05/25/20@3 :40pm. Pt aware of appt. She knows to continue Iron.

## 2020-05-12 NOTE — Telephone Encounter (Signed)
Received call from Dr. Neil Crouch (hospitalist) that has been taking care of pt. States she came in to the hospital 2 days ago with SOB, and some ascitic fluid and a small blood clot in her leg. She was placed on xarelto and MD states after 2 weeks on the xarelto she should be able to hold it for 2-3 days for the [facility]. He suggests rescheduling the [facility] that is scheduled for Monday 05/16/20. Please advise.

## 2020-05-12 NOTE — Progress Notes (Signed)
Pt discharged to home at this time. Prior to dc, IV and tele were removed. Pt was given DC paperwork and medications along with follow up instructions were reviewed. Pt verbalized understanding and stated no concerns at this time. Pt was taken to main entrance in a wheelchair where she was picked up in personal vehicle;e being driven by her granddaughter. Pt stable at time of discharge and departure.

## 2020-05-12 NOTE — Telephone Encounter (Signed)
Yes, we will need to cancel her upcoming procedures and reevaluate her in the office. In the interim she should stay on iron therapy daily.

## 2020-05-12 NOTE — Telephone Encounter (Signed)
While prepping chart for for May 16, 2020 schedule, I saw pt had been in the hospital twice(05-10-20 last date).  Also pt was placed on Xarelto, which she was not on when Dr. Henrene Pastor saw her in March 23, 2020.  Please advise if we proceed with EGD/Colon for May 16, 2020.

## 2020-05-13 LAB — PH, BODY FLUID: pH, Body Fluid: 7.4

## 2020-05-13 LAB — TRIGLYCERIDES, BODY FLUIDS: Triglycerides, Fluid: 21 mg/dL

## 2020-05-13 LAB — TOTAL BILIRUBIN, BODY FLUID

## 2020-05-16 ENCOUNTER — Encounter: Payer: Medicare Other | Admitting: Internal Medicine

## 2020-05-16 LAB — AEROBIC/ANAEROBIC CULTURE W GRAM STAIN (SURGICAL/DEEP WOUND)
Culture: NO GROWTH
Gram Stain: NONE SEEN

## 2020-05-25 ENCOUNTER — Ambulatory Visit: Payer: Medicare Other | Admitting: Internal Medicine

## 2020-05-25 ENCOUNTER — Other Ambulatory Visit (INDEPENDENT_AMBULATORY_CARE_PROVIDER_SITE_OTHER): Payer: Medicare Other

## 2020-05-25 ENCOUNTER — Encounter: Payer: Self-pay | Admitting: Internal Medicine

## 2020-05-25 VITALS — BP 130/70 | HR 67 | Ht 67.0 in | Wt 219.0 lb

## 2020-05-25 DIAGNOSIS — D509 Iron deficiency anemia, unspecified: Secondary | ICD-10-CM | POA: Diagnosis not present

## 2020-05-25 DIAGNOSIS — R195 Other fecal abnormalities: Secondary | ICD-10-CM | POA: Diagnosis not present

## 2020-05-25 DIAGNOSIS — K7460 Unspecified cirrhosis of liver: Secondary | ICD-10-CM

## 2020-05-25 DIAGNOSIS — R197 Diarrhea, unspecified: Secondary | ICD-10-CM

## 2020-05-25 DIAGNOSIS — R160 Hepatomegaly, not elsewhere classified: Secondary | ICD-10-CM

## 2020-05-25 DIAGNOSIS — K219 Gastro-esophageal reflux disease without esophagitis: Secondary | ICD-10-CM

## 2020-05-25 DIAGNOSIS — Z7901 Long term (current) use of anticoagulants: Secondary | ICD-10-CM

## 2020-05-25 DIAGNOSIS — R188 Other ascites: Secondary | ICD-10-CM | POA: Diagnosis not present

## 2020-05-25 LAB — CBC WITH DIFFERENTIAL/PLATELET
Basophils Absolute: 0.1 10*3/uL (ref 0.0–0.1)
Basophils Relative: 0.8 % (ref 0.0–3.0)
Eosinophils Absolute: 0.1 10*3/uL (ref 0.0–0.7)
Eosinophils Relative: 0.9 % (ref 0.0–5.0)
HCT: 35.5 % — ABNORMAL LOW (ref 36.0–46.0)
Hemoglobin: 11.6 g/dL — ABNORMAL LOW (ref 12.0–15.0)
Lymphocytes Relative: 35.1 % (ref 12.0–46.0)
Lymphs Abs: 2.4 10*3/uL (ref 0.7–4.0)
MCHC: 32.6 g/dL (ref 30.0–36.0)
MCV: 89.5 fl (ref 78.0–100.0)
Monocytes Absolute: 0.4 10*3/uL (ref 0.1–1.0)
Monocytes Relative: 5.1 % (ref 3.0–12.0)
Neutro Abs: 4 10*3/uL (ref 1.4–7.7)
Neutrophils Relative %: 58.1 % (ref 43.0–77.0)
Platelets: 130 10*3/uL — ABNORMAL LOW (ref 150.0–400.0)
RBC: 3.97 Mil/uL (ref 3.87–5.11)
RDW: 16.1 % — ABNORMAL HIGH (ref 11.5–15.5)
WBC: 6.9 10*3/uL (ref 4.0–10.5)

## 2020-05-25 LAB — COMPREHENSIVE METABOLIC PANEL
ALT: 15 U/L (ref 0–35)
AST: 27 U/L (ref 0–37)
Albumin: 3.3 g/dL — ABNORMAL LOW (ref 3.5–5.2)
Alkaline Phosphatase: 110 U/L (ref 39–117)
BUN: 14 mg/dL (ref 6–23)
CO2: 28 mEq/L (ref 19–32)
Calcium: 8.9 mg/dL (ref 8.4–10.5)
Chloride: 106 mEq/L (ref 96–112)
Creatinine, Ser: 1.19 mg/dL (ref 0.40–1.20)
GFR: 43.84 mL/min — ABNORMAL LOW (ref >60.00)
Glucose, Bld: 92 mg/dL (ref 70–99)
Potassium: 4.4 mEq/L (ref 3.5–5.1)
Sodium: 141 mEq/L (ref 135–145)
Total Bilirubin: 0.9 mg/dL (ref 0.2–1.2)
Total Protein: 6.2 g/dL (ref 6.0–8.3)

## 2020-05-25 MED ORDER — SPIRONOLACTONE 50 MG PO TABS: 50.0000 mg | ORAL_TABLET | Freq: Every day | ORAL | 6 refills | Status: DC

## 2020-05-25 MED ORDER — FUROSEMIDE 40 MG PO TABS: 40.0000 mg | ORAL_TABLET | Freq: Every day | ORAL | 6 refills | Status: DC

## 2020-05-25 MED ORDER — ONDANSETRON HCL 4 MG PO TABS: 4.0000 mg | ORAL_TABLET | ORAL | 1 refills | Status: DC | PRN

## 2020-05-25 NOTE — Progress Notes (Signed)
HISTORY OF PRESENT ILLNESS:  Cheryl Jordan is a 79 y.o. female with MULTIPLE medical problems as listed below.  She was seen in this office as a new patient March 23, 2020 regarding iron deficiency anemia and Hemoccult-positive stool.  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  The following is the summary assessment and plan from that visit: ASSESSMENT:  1. Iron deficiency anemia with Hemoccult-positive stool. Rule out GI mucosal lesion. Possibilities include recurrent neoplasia, AVMs, pleural hypertensive gastropathy. 2. 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 Last colonoscopy elsewhere March 2016 with diminutive polyps 3. 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 Multiple medical problems including obesity and diabetes 5. GERD. Symptoms controlled on PPI 6. Chronic postprandial diarrhea with urgency. Rule out IBS. Rule out also related. Rule out microscopic colitis   PLAN:  1. Schedule colonoscopy with biopsies to evaluate iron deficiency anemia, Hemoccult positive stool and diarrhea. Patient is HIGH risk given her age and comorbidities.The nature of the procedure, as well as the risks, benefits, and alternatives were carefully and thoroughly reviewed with the patient. Ample time for discussion and questions allowed. The patient understood, was satisfied, and agreed to proceed. 2. Schedule upper endoscopy to evaluate iron deficiency anemia and Hemoccult-positive stool. Patient is HIGH RISK given her age and comorbidities.The nature of the procedure, as well as the risks, benefits, and alternatives were carefully and thoroughly reviewed with the patient. Ample time for discussion and questions allowed. The patient understood, was satisfied, and agreed to proceed. 3. Reflux precautions 4. Continue PPI 5. Hold iron 1 week prior to procedure to assist with colon cleansing for her procedures 6. Hold diabetic medication  to avoid unwanted hypoglycemia  Since that time she was hospitalized with decompensated cirrhosis with symptomatic ascites and likely gastrointestinal viral infection "(norovirus).  She underwent therapeutic paracentesis of 3.2 L.  She was placed on Lasix 20 mg daily.  She was also diagnosed with a right lower extremity DVT for which she was placed on Xarelto.  Finally, she was found to have an enlarging 2.8 x 2.5 complex hyperdense abnormality in the right hepatic lobe posteriorly.  This was concerning for hepatocellular carcinoma.  MRI recommended.  She was not seen by GI as an inpatient.  Her previously scheduled endoscopic examinations were canceled and this appointment made. Patient tells me that she is feeling terrible.  Specifically problems with chronic nausea but no vomiting and fatigue.  She still feels abdominal distention and has lower extremity edema.  In addition to the ultrasound and CT scan as alluded to the patient's most recent blood work from May 12, 2020 shows BUN of 25, creatinine 1.15, normal electrolytes.  CBC with hemoglobin 10.3.  Platelet count 145,000.  REVIEW OF SYSTEMS:  All non-GI ROS negative unless otherwise stated in the HPI except for  Past Medical History:  Diagnosis Date  . Allergy   . Anemia    hx of  . Anxiety   . Arthritis   . Colon cancer ([place]) dx'd 11/2014  . 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 Fatty liver   . Gallbladder disease   . GERD (gastroesophageal reflux disease)   . History of gout   . Hypertension   . Hypothyroidism   . Kidney disorder    spot on left kidney  . Osteoporosis   . PONV (postoperative nausea and vomiting)     Past Surgical History:  Procedure Laterality Date  .  CHOLECYSTECTOMY    . IR RADIOLOGIST EVAL & MGMT  01/24/2017  . KNEE CARTILAGE SURGERY Bilateral   . NEPHRECTOMY Left 02/10/2015   Procedure: OPEN RETROPERINTONEAL EXPLORATION LEFT RENAL  CYST DECORTICATION X 5;  Surgeon: Cleon Gustin, MD;  Location: [facility];  Service: Urology;  Laterality: Left;  . PARTIAL COLECTOMY N/A 02/10/2015   Procedure: OPEN RIGHT  COLECTOMY ;  Surgeon: [name], MD;  Location: [facility];  Service: General;  Laterality: N/A;  . TUBAL LIGATION      Social History Cheryl Jordan  reports that she has never smoked. She has never used smokeless tobacco. She reports that she does not drink alcohol and does not use drugs.  family history includes COPD in her brother, brother, and father; Diabetes in her mother; GER disease in her sister; Heart attack in her brother; Hypertension in her brother, sister, and another family member; Lung cancer in her brother.  Allergies  Allergen Reactions  . Aspirin Other (See Comments)    Runny nose   . Codeine Nausea And Vomiting       PHYSICAL EXAMINATION: Vital signs: BP 130/70   Pulse 67   Ht 5' 7"  (1.702 m)   Wt 219 lb (99.3 kg)   SpO2 92%   BMI 34.30 kg/m   Constitutional: Chronically ill and fatigued appearing, no acute distress Psychiatric: alert and oriented x3, cooperative Eyes: extraocular movements intact, anicteric, conjunctiva pink Mouth: oral pharynx moist, no lesions Neck: supple no lymphadenopathy Cardiovascular: heart regular rate and rhythm, no murmur Lungs: clear to auscultation bilaterally except for the right base which has crackles Abdomen: soft, nontender, nontensely distended with bulging flanks and probable fluid wave, no peritoneal signs, normal bowel sounds, no organomegaly Rectal: Omitted Extremities: no clubbing or cyanosis.  There is lower extremity edema bilaterally which is 1+ on the left and 2+ on the right Skin: no lesions on visible extremities Neuro: No focal deficits. No asterixis.    ASSESSMENT:  1.  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  Recently decompensated with symptomatic ascites. 2.  Ascites 3.  New onset DVT.  Now on Xarelto 4.  Right liver lesion.  Likely [place] 5.  History of colon cancer 6.   Recent office evaluation for iron deficiency anemia and Hemoccult-positive stool 7.  Chronic diarrhea postprandially.  Ongoing 8.  Multiple general medical problems 9.  Persistent nausea  PLAN:  1.  Increase Lasix to 40 mg daily.  Prescribed.  Medication risks reviewed 2.  Initiate Aldactone 50 mg daily.  Prescribed.  Medication risks reviewed 3.  Prescribe Zofran 4 mg every 4 hours as needed for nausea.  Medication risks reviewed 4.  CBC, comprehensive metabolic panel, PT/INR today 5.  Basic metabolic panel in 1 week 6.  MRI of the liver at some point.  Patient has not feeling up to having this done at this time.  I agree 7.  Cancel planned colonoscopy and upper endoscopy evaluations.  The patient does not fit for these examinations currently 8.  Continue iron replacement therapy 9.  Continue daily PPI 10.  Office follow-up in 2 months A total time of 50 minutes was spent preparing to see the patient, reviewing her hospitalization, blood work, and x-rays.  Also obtaining comprehensive history, performing comprehensive physical examination, counseling the patient on her myriad of above listed problems, ordering new medications and blood work, arranging follow-up blood work and clinical follow-up.  Finally, documenting clinical information in the health record

## 2020-05-25 NOTE — Patient Instructions (Signed)
We have sent the following medications to your pharmacy for you to pick up at your convenience:  Lasix, Aldactone, Zofran  Please follow up on __________________________

## 2020-05-26 ENCOUNTER — Other Ambulatory Visit: Payer: Self-pay

## 2020-05-26 DIAGNOSIS — K7460 Unspecified cirrhosis of liver: Secondary | ICD-10-CM

## 2020-05-26 LAB — PROTIME-INR
INR: 3 ratio — ABNORMAL HIGH (ref 0.8–1.0)
Prothrombin Time: 33.2 s — ABNORMAL HIGH (ref 9.6–13.1)

## 2020-06-06 ENCOUNTER — Encounter: Payer: Self-pay | Admitting: Physician Assistant

## 2020-06-06 ENCOUNTER — Ambulatory Visit (INDEPENDENT_AMBULATORY_CARE_PROVIDER_SITE_OTHER): Payer: Medicare Other | Admitting: Physician Assistant

## 2020-06-06 ENCOUNTER — Telehealth: Payer: Self-pay | Admitting: Physician Assistant

## 2020-06-06 ENCOUNTER — Other Ambulatory Visit: Payer: Self-pay

## 2020-06-06 VITALS — BP 130/60 | HR 67 | Temp 98.3°F | Resp 16 | Ht 67.0 in | Wt 215.0 lb

## 2020-06-06 DIAGNOSIS — N1831 Chronic kidney disease, stage 3a: Secondary | ICD-10-CM | POA: Diagnosis not present

## 2020-06-06 DIAGNOSIS — K219 Gastro-esophageal reflux disease without esophagitis: Secondary | ICD-10-CM

## 2020-06-06 DIAGNOSIS — D509 Iron deficiency anemia, unspecified: Secondary | ICD-10-CM

## 2020-06-06 DIAGNOSIS — K7460 Unspecified cirrhosis of liver: Secondary | ICD-10-CM

## 2020-06-06 DIAGNOSIS — R197 Diarrhea, unspecified: Secondary | ICD-10-CM

## 2020-06-06 DIAGNOSIS — R188 Other ascites: Secondary | ICD-10-CM

## 2020-06-06 LAB — CBC WITH DIFFERENTIAL/PLATELET
Basophils Absolute: 0 10*3/uL (ref 0.0–0.1)
Basophils Relative: 0.5 % (ref 0.0–3.0)
Eosinophils Absolute: 0 10*3/uL (ref 0.0–0.7)
Eosinophils Relative: 1 % (ref 0.0–5.0)
HCT: 28.6 % — ABNORMAL LOW (ref 36.0–46.0)
Hemoglobin: 9.3 g/dL — ABNORMAL LOW (ref 12.0–15.0)
Lymphocytes Relative: 35.4 % (ref 12.0–46.0)
Lymphs Abs: 1.5 10*3/uL (ref 0.7–4.0)
MCHC: 32.5 g/dL (ref 30.0–36.0)
MCV: 91.3 fl (ref 78.0–100.0)
Monocytes Absolute: 0.3 10*3/uL (ref 0.1–1.0)
Monocytes Relative: 6.1 % (ref 3.0–12.0)
Neutro Abs: 2.4 10*3/uL (ref 1.4–7.7)
Neutrophils Relative %: 57 % (ref 43.0–77.0)
Platelets: 121 10*3/uL — ABNORMAL LOW (ref 150.0–400.0)
RBC: 3.13 Mil/uL — ABNORMAL LOW (ref 3.87–5.11)
RDW: 17.4 % — ABNORMAL HIGH (ref 11.5–15.5)
WBC: 4.3 10*3/uL (ref 4.0–10.5)

## 2020-06-06 LAB — BASIC METABOLIC PANEL
BUN: 19 mg/dL (ref 6–23)
CO2: 28 mEq/L (ref 19–32)
Calcium: 8.5 mg/dL (ref 8.4–10.5)
Chloride: 104 mEq/L (ref 96–112)
Creatinine, Ser: 1.6 mg/dL — ABNORMAL HIGH (ref 0.40–1.20)
GFR: 30.73 mL/min — ABNORMAL LOW (ref >60.00)
Glucose, Bld: 184 mg/dL — ABNORMAL HIGH (ref 70–99)
Potassium: 4.5 mEq/L (ref 3.5–5.1)
Sodium: 139 mEq/L (ref 135–145)

## 2020-06-06 MED ORDER — LORAZEPAM 0.5 MG PO TABS: 0.5000 mg | ORAL_TABLET | Freq: Two times a day (BID) | ORAL | 0 refills | Status: DC | PRN

## 2020-06-06 MED ORDER — ONDANSETRON HCL 8 MG PO TABS: 8.0000 mg | ORAL_TABLET | Freq: Three times a day (TID) | ORAL | 0 refills | Status: DC | PRN

## 2020-06-06 MED ORDER — RIVAROXABAN 20 MG PO TABS: 20.0000 mg | ORAL_TABLET | Freq: Every day | ORAL | 0 refills | Status: DC

## 2020-06-06 NOTE — Patient Instructions (Addendum)
Please go to the lab today for blood work.  I will call you with your results. We will alter treatment regimen(s) if indicated by your results.   Please continue follow-up with your specialists.  I want you to add-on a nightly OTC Pepcid 20 mg to your regimen. I also recommend starting a daily OTC probiotic.  I have adjusted the dose of your nausea medication. Continue other medications as directed.   I will make sure to send a copy of your blood work to your GI provider.

## 2020-06-06 NOTE — Telephone Encounter (Signed)
Pt called in asking if Einar Pheasant could send in a refill for the Xarelto to the [facility]

## 2020-06-06 NOTE — Progress Notes (Signed)
Patient presents to clinic today for hospital follow-up.  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  ER work-up included CTA which was negative for pulmonary embolism but positive for bilateral pleural effusion.  Also noted to have ascites and elevated D-dimer.  During admission patient also underwent lower extremity ultrasound that was positive for DVT of right posterior vein.  Patient underwent paracentesis with 3 L removed from abdomen.  LFTs within normal limits.  Patient symptoms improved/resolved with paracentesis.  Patient was started on low-dose Lasix 20 mg daily, and Xarelto.  Was scheduled for follow-up with her gastroenterologist for 05/25/2020.  Discharged home on 05/12/2028.  Patient attended follow-up with gastroenterology on 05/25/2020 as scheduled.  At this time furosemide was increased to 40 mg daily and patient was started on spironolactone 50 mg daily.  Labs obtained at that visit including CBC with hemoglobin noted at 11.6, CMP with creatinine of 1.19 and pro time that was slightly elevated.  Patient encouraged to have repeat CBC and CMP in 1 week.  Has yet to do this and would like drawn here today.  Patient states overall she is doing well in terms of swelling.  Still having substantial fatigue and occasional windedness mainly with exertion.  Denies any chest pain, lightheadedness or dizziness.  Notes about a week ago she had an episode of blood in her stool.  None since.  Does note darker stools but takes a daily iron supplement.  Denies any tenesmus.  Notes good urinary output.  Is trying to stay well-hydrated and eat a well-balanced diet.    Past Medical History:  Diagnosis Date  . Allergy   . Anemia    hx of  . Anxiety   . Arthritis   . Colon cancer ([place]) dx'd 11/2014  . Diabetes mellitus without complication ([place])    TYPE II  . Fatty liver   . Gallbladder disease   .  GERD (gastroesophageal reflux disease)   . History of gout   . Hypertension   . Hypothyroidism   . Kidney disorder    spot on left kidney  . Osteoporosis   . PONV (postoperative nausea and vomiting)     Current Outpatient Medications on File Prior to Visit  Medication Sig Dispense Refill  . ACCU-CHEK AVIVA PLUS test strip     . acetaminophen (TYLENOL) 500 MG tablet Take 500 mg by mouth every 6 (six) hours as needed for moderate pain.    Marland Kitchen amLODipine (NORVASC) 2.5 MG tablet Take 1 tablet (2.5 mg total) by mouth daily. 90 tablet 2  . atenolol (TENORMIN) 50 MG tablet TAKE 1 TABLET BY MOUTH IN  THE MORNING (Patient taking differently: Take 50 mg by mouth daily. ) 90 tablet 2  . Blood Glucose Monitoring Suppl (ACCU-CHEK AVIVA) device Use as instructed 1 each 0  . calcium citrate-vitamin D (CITRACAL+D) 315-200 MG-UNIT tablet Take 1 tablet by mouth daily.  (Patient not taking: Reported on 05/25/2020)    . ferrous sulfate 325 (65 FE) MG EC tablet Take 1 tablet (325 mg total) by mouth 2 (two) times daily with a meal. (Patient taking differently: Take 325 mg by mouth daily. ) 90 tablet 1  . furosemide (LASIX) 40 MG tablet Take 1 tablet (40 mg total) by mouth daily. 30 tablet 6  . glipiZIDE (GLUCOTROL XL) 5 MG 24 hr tablet TAKE (1) TABLET DAILY WITH BREAKFAST. (Patient taking differently: Take 5 mg  by mouth daily. ) 90 tablet 1  . levothyroxine (SYNTHROID) 88 MCG tablet Take 1 tablet (88 mcg total) by mouth daily. 90 tablet 2  . loperamide (IMODIUM) 2 MG capsule Take 2 capsules by mouth once. Then take additional 2mg  capsule as needed for continued diarrhea. No more than 16 mg in 24 hours. (Patient taking differently: Take 2 mg by mouth as needed for diarrhea or loose stools. Take 2 capsules by mouth once. Then take additional 2mg  capsule as needed for continued diarrhea. No more than 16 mg in 24 hours.) 30 capsule 0  . omeprazole (PRILOSEC) 20 MG capsule Take 1 capsule (20 mg total) by mouth daily. 90  capsule 2  . ondansetron (ZOFRAN) 4 MG tablet Take 1 tablet (4 mg total) by mouth every 4 (four) hours as needed for nausea or vomiting. 90 tablet 1  . RIVAROXABAN (XARELTO) VTE STARTER PACK (15 & 20 MG TABLETS) Follow package directions: Take one 15mg  tablet by mouth twice a day. On day 22, switch to one 20mg  tablet once a day. Take with food. 51 each 0  . spironolactone (ALDACTONE) 50 MG tablet Take 1 tablet (50 mg total) by mouth daily. 30 tablet 6  . telmisartan (MICARDIS) 40 MG tablet Take 1 tablet (40 mg total) by mouth daily. (Patient taking differently: Take 20 mg by mouth in the morning and at bedtime. ) 90 tablet 2   No current facility-administered medications on file prior to visit.    Allergies  Allergen Reactions  . Aspirin Other (See Comments)    Runny nose   . Codeine Nausea And Vomiting    Family History  Problem Relation Age of Onset  . Diabetes Mother   . Hypertension Other   . COPD Father   . Hypertension Sister   . GER disease Sister   . Heart attack Brother   . COPD Brother   . Lung cancer Brother   . COPD Brother   . Hypertension Brother     Social History   Socioeconomic History  . Marital status: Married    Spouse name: Not on file  . Number of children: 2  . Years of education: Not on file  . Highest education level: Not on file  Occupational History  . Occupation: Retired  Tobacco Use  . Smoking status: Never Smoker  . Smokeless tobacco: Never Used  Vaping Use  . Vaping Use: Never used  Substance and Sexual Activity  . Alcohol use: No  . Drug use: No  . Sexual activity: Never  Other Topics Concern  . Not on file  Social History Narrative   03/03/15-Married, husband Jeneen Rinks   #2 grown sons and #2 grand daughters   Social Determinants of Health   Financial Resource Strain: Low Risk   . Difficulty of Paying Living Expenses: Not hard at all  Food Insecurity: No Food Insecurity  . Worried About Charity fundraiser in the Last Year: Never  true  . Ran Out of Food in the Last Year: Never true  Transportation Needs: No Transportation Needs  . Lack of Transportation (Medical): No  . Lack of Transportation (Non-Medical): No  Physical Activity: Inactive  . Days of Exercise per Week: 0 days  . Minutes of Exercise per Session: 0 min  Stress: No Stress Concern Present  . Feeling of Stress : Not at all  Social Connections: Socially Integrated  . Frequency of Communication with Friends and Family: More than three times a week  .  Frequency of Social Gatherings with Friends and Family: More than three times a week  . Attends Religious Services: More than 4 times per year  . Active Member of Clubs or Organizations: Yes  . Attends Archivist Meetings: More than 4 times per year  . Marital Status: Married    Review of Systems - See HPI.  All other ROS are negative.  Wt 215 lb (97.5 kg)   BMI 33.67 kg/m   Physical Exam Vitals reviewed.  Constitutional:      Appearance: Normal appearance.  HENT:     Head: Normocephalic and atraumatic.  Cardiovascular:     Rate and Rhythm: Normal rate and regular rhythm.     Pulses: Normal pulses.     Heart sounds: Normal heart sounds.  Pulmonary:     Effort: Pulmonary effort is normal.     Breath sounds: Normal breath sounds.  Abdominal:     General: Bowel sounds are normal. There is no distension.     Palpations: Abdomen is soft.     Tenderness: There is no abdominal tenderness.  Musculoskeletal:     Cervical back: Neck supple.  Neurological:     General: No focal deficit present.     Mental Status: She is alert and oriented to person, place, and time.  Psychiatric:        Mood and Affect: Mood normal.     Recent Results (from the past 2160 hour(s))  Pro b natriuretic peptide (BNP)     Status: None   Collection Time: 03/30/20  3:07 PM  Result Value Ref Range   NT-Pro BNP 250 0 - 738 pg/mL    Comment: The following cut-points have been suggested for the use of proBNP  for the diagnostic evaluation of heart failure (HF) in patients with acute dyspnea: Modality                     Age           Optimal Cut                            (years)            Point ------------------------------------------------------ Diagnosis (rule in HF)        <50            450 pg/mL                           50 - 75            900 pg/mL                               >75           1800 pg/mL Exclusion (rule out HF)  Age independent     300 pg/mL   ECHOCARDIOGRAM COMPLETE     Status: None   Collection Time: 04/08/20  4:23 PM  Result Value Ref Range   Area-P 1/2 2.66 cm2   S' Lateral 3.00 cm   AV Area mean vel 1.79 cm2   AR max vel 1.77 cm2   AV Area VTI 2.11 cm2   Ao pk vel 2.17 m/s   AV Mean grad 11.0 mmHg   AV Peak grad 18.7 mmHg  Lipase, blood     Status: Abnormal   Collection  Time: 04/29/20 10:06 AM  Result Value Ref Range   Lipase 95 (H) 11 - 51 U/L    Comment: Performed at [facility], [place] [address] [place], [place] [address]  Comprehensive metabolic panel     Status: Abnormal   Collection Time: 04/29/20 10:06 AM  Result Value Ref Range   Sodium 135 135 - 145 mmol/L   Potassium 4.0 3.5 - 5.1 mmol/L   Chloride 104 98 - 111 mmol/L   CO2 22 22 - 32 mmol/L   Glucose, Bld 200 (H) 70 - 99 mg/dL    Comment: Glucose reference range applies only to samples taken after fasting for at least 8 hours.   BUN 25 (H) 8 - 23 mg/dL   Creatinine, Ser 1.32 (H) 0.44 - 1.00 mg/dL   Calcium 8.7 (L) 8.9 - 10.3 mg/dL   Total Protein 6.2 (L) 6.5 - 8.1 g/dL   Albumin 3.1 (L) 3.5 - 5.0 g/dL   AST 30 15 - 41 U/L   ALT 20 0 - 44 U/L   Alkaline Phosphatase 90 38 - 126 U/L   Total Bilirubin 1.7 (H) 0.3 - 1.2 mg/dL   GFR, Estimated 39 (L) >60 mL/min   Anion gap 9 5 - 15    Comment: Performed at [facility], [place] [address], [place], [place] [address]  CBC     Status: Abnormal   Collection Time: 04/29/20 10:06 AM  Result Value Ref Range    WBC 6.8 4.0 - 10.5 K/uL   RBC 3.69 (L) 3.87 - 5.11 MIL/uL   Hemoglobin 11.2 (L) 12.0 - 15.0 g/dL   HCT 34.2 (L) 36 - 46 %   MCV 92.7 80.0 - 100.0 fL   MCH 30.4 26.0 - 34.0 pg   MCHC 32.7 30.0 - 36.0 g/dL   RDW 15.6 (H) 11.5 - 15.5 %   Platelets 100 (L) 150 - 400 K/uL    Comment: REPEATED TO VERIFY PLATELET COUNT CONFIRMED BY SMEAR SPECIMEN CHECKED FOR CLOTS Immature Platelet Fraction may be clinically indicated, consider ordering this additional test [id number]    nRBC 0.0 0.0 - 0.2 %    Comment: Performed at [facility], [place] [address] [place], [place] [address]  Hemoglobin A1c     Status: Abnormal   Collection Time: 04/29/20 10:06 AM  Result Value Ref Range   Hgb A1c MFr Bld 6.2 (H) 4.8 - 5.6 %    Comment: (NOTE)         Prediabetes: 5.7 - 6.4         Diabetes: >6.4         Glycemic control for adults with diabetes: <7.0    Mean Plasma Glucose 131 mg/dL    Comment: (NOTE) Performed At: [facility] [place], [place] [id number] Rush Farmer MD PO:[id number]   Respiratory Panel by RT PCR (Flu A&B, Covid) - Nasopharyngeal Swab     Status: None   Collection Time: 04/29/20 10:30 AM   Specimen: Nasopharyngeal Swab  Result Value Ref Range   SARS Coronavirus 2 by RT PCR NEGATIVE NEGATIVE    Comment: (NOTE) SARS-CoV-2 target nucleic acids are NOT DETECTED.  The SARS-CoV-2 RNA is generally detectable in upper respiratoy specimens during the acute phase of infection. The lowest concentration of SARS-CoV-2 viral copies this assay can detect is 131 copies/mL. A negative result does not preclude SARS-Cov-2 infection and should not be used as the sole basis for treatment or other patient  management decisions. A negative result may occur with  improper specimen collection/handling, submission of specimen other than nasopharyngeal swab, presence of viral mutation(s) within the areas targeted by this assay, and inadequate number of viral  copies (<131 copies/mL). A negative result must be combined with clinical observations, patient history, and epidemiological information. The expected result is Negative.  Fact Sheet for Patients:  PinkCheek.be  Fact Sheet for Healthcare Providers:  GravelBags.it  This test is no t yet approved or cleared by the [place] FDA and  has been authorized for detection and/or diagnosis of SARS-CoV-2 by FDA under an Emergency Use Authorization (EUA). This EUA will remain  in effect (meaning this test can be used) for the duration of the COVID-19 declaration under Section 564(b)(1) of the Act, 21 U.S.C. section 360bbb-3(b)(1), unless the authorization is terminated or revoked sooner.     Influenza A by PCR NEGATIVE NEGATIVE   Influenza B by PCR NEGATIVE NEGATIVE    Comment: (NOTE) The Xpert Xpress SARS-CoV-2/FLU/RSV assay is intended as an aid in  the diagnosis of influenza from Nasopharyngeal swab specimens and  should not be used as a sole basis for treatment. Nasal washings and  aspirates are unacceptable for Xpert Xpress SARS-CoV-2/FLU/RSV  testing.  Fact Sheet for Patients: PinkCheek.be  Fact Sheet for Healthcare Providers: GravelBags.it  This test is not yet approved or cleared by the [place] FDA and  has been authorized for detection and/or diagnosis of SARS-CoV-2 by  FDA under an Emergency Use Authorization (EUA). This EUA will remain  in effect (meaning this test can be used) for the duration of the  Covid-19 declaration under Section 564(b)(1) of the Act, 21  U.S.C. section 360bbb-3(b)(1), unless the authorization is  terminated or revoked. Performed at [facility], [place] [address], [place], [place] [address]   Glucose, capillary     Status: Abnormal   Collection Time: 04/29/20  5:00 PM  Result Value Ref Range    Glucose-Capillary 155 (H) 70 - 99 mg/dL    Comment: Glucose reference range applies only to samples taken after fasting for at least 8 hours.  Urinalysis, Routine w reflex microscopic Urine, Clean Catch     Status: Abnormal   Collection Time: 04/29/20  8:00 PM  Result Value Ref Range   Color, Urine YELLOW YELLOW   APPearance CLEAR CLEAR   Specific Gravity, Urine 1.011 1.005 - 1.030   pH 6.0 5.0 - 8.0   Glucose, UA NEGATIVE NEGATIVE mg/dL   Hgb urine dipstick NEGATIVE NEGATIVE   Bilirubin Urine NEGATIVE NEGATIVE   Ketones, ur NEGATIVE NEGATIVE mg/dL   Protein, ur NEGATIVE NEGATIVE mg/dL   Nitrite NEGATIVE NEGATIVE   Leukocytes,Ua 117 (A) NEGATIVE   RBC / HPF 0-5 0 - 5 RBC/hpf   WBC, UA 0-5 0 - 5 WBC/hpf   Bacteria, UA RARE (A) NONE SEEN   Squamous Epithelial / LPF 0-5 0 - 5    Comment: Performed at [facility], [place] [address] [place],  [address]  Glucose, capillary     Status: Abnormal   Collection Time: 04/29/20  9:30 PM  Result Value Ref Range   Glucose-Capillary 125 (H) 70 - 99 mg/dL    Comment: Glucose reference range applies only to samples taken after fasting for at least 8 hours.  Comprehensive metabolic panel     Status: Abnormal   Collection Time: 04/30/20  4:06 AM  Result Value Ref Range   Sodium 135 135 - 145 mmol/L  Potassium 4.0 3.5 - 5.1 mmol/L   Chloride 107 98 - 111 mmol/L   CO2 19 (L) 22 - 32 mmol/L   Glucose, Bld 164 (H) 70 - 99 mg/dL    Comment: Glucose reference range applies only to samples taken after fasting for at least 8 hours.   BUN 18 8 - 23 mg/dL   Creatinine, Ser 0.99 0.44 - 1.00 mg/dL   Calcium 7.9 (L) 8.9 - 10.3 mg/dL   Total Protein 5.2 (L) 6.5 - 8.1 g/dL   Albumin 2.5 (L) 3.5 - 5.0 g/dL   AST 24 15 - 41 U/L   ALT 17 0 - 44 U/L   Alkaline Phosphatase 72 38 - 126 U/L   Total Bilirubin 1.5 (H) 0.3 - 1.2 mg/dL   GFR, Estimated 55 (L) >60 mL/min   Anion gap 9 5 - 15    Comment: Performed at [facility], [place] [address], [place], [place] [address]  CBC     Status: Abnormal   Collection Time: 04/30/20  4:06 AM  Result Value Ref Range   WBC 5.3 4.0 - 10.5 K/uL   RBC 3.33 (L) 3.87 - 5.11 MIL/uL   Hemoglobin 9.9 (L) 12.0 - 15.0 g/dL   HCT 30.8 (L) 36 - 46 %   MCV 92.5 80.0 - 100.0 fL   MCH 29.7 26.0 - 34.0 pg   MCHC 32.1 30.0 - 36.0 g/dL   RDW 15.2 11.5 - 15.5 %   Platelets 83 (L) 150 - 400 K/uL    Comment: REPEATED TO VERIFY Immature Platelet Fraction may be clinically indicated, consider ordering this additional test [id number] CONSISTENT WITH PREVIOUS RESULT    nRBC 0.0 0.0 - 0.2 %    Comment: Performed at [facility], [place] [address] [place], [place] [address]  Glucose, capillary     Status: Abnormal   Collection Time: 04/30/20  7:46 AM  Result Value Ref Range   Glucose-Capillary 153 (H) 70 - 99 mg/dL    Comment: Glucose reference range applies only to samples taken after fasting for at least 8 hours.  Gastrointestinal Panel by PCR , Stool     Status: Abnormal   Collection Time: 04/30/20 10:31 AM   Specimen: Stool  Result Value Ref Range   Campylobacter species NOT DETECTED NOT DETECTED   Plesimonas shigelloides NOT DETECTED NOT DETECTED   Salmonella species NOT DETECTED NOT DETECTED   Yersinia enterocolitica NOT DETECTED NOT DETECTED   Vibrio species NOT DETECTED NOT DETECTED   Vibrio cholerae NOT DETECTED NOT DETECTED   Enteroaggregative E coli (EAEC) NOT DETECTED NOT DETECTED   Enteropathogenic E coli (EPEC) NOT DETECTED NOT DETECTED   Enterotoxigenic E coli (ETEC) NOT DETECTED NOT DETECTED   Shiga like toxin producing E coli (STEC) NOT DETECTED NOT DETECTED   Shigella/Enteroinvasive E coli (EIEC) NOT DETECTED NOT DETECTED   Cryptosporidium NOT DETECTED NOT DETECTED   Cyclospora cayetanensis NOT DETECTED NOT DETECTED   Entamoeba histolytica NOT DETECTED NOT DETECTED   Giardia lamblia NOT DETECTED NOT DETECTED   Adenovirus F40/41 NOT  DETECTED NOT DETECTED   Astrovirus NOT DETECTED NOT DETECTED   Norovirus GI/GII DETECTED (A) NOT DETECTED    Comment: RESULT CALLED TO, READ BACK BY AND VERIFIED WITH: BRITANNY BORING AT 1627 05/01/20.PMF    Rotavirus A NOT DETECTED NOT DETECTED   Sapovirus (I, II, IV, and V) NOT DETECTED NOT DETECTED    Comment: Performed at [facility], [place],  [place], [place] [address]  Glucose, capillary     Status: Abnormal   Collection Time: 04/30/20 12:08 PM  Result Value Ref Range   Glucose-Capillary 178 (H) 70 - 99 mg/dL    Comment: Glucose reference range applies only to samples taken after fasting for at least 8 hours.  Glucose, capillary     Status: Abnormal   Collection Time: 04/30/20  4:15 PM  Result Value Ref Range   Glucose-Capillary 132 (H) 70 - 99 mg/dL    Comment: Glucose reference range applies only to samples taken after fasting for at least 8 hours.  Glucose, capillary     Status: Abnormal   Collection Time: 04/30/20  8:53 PM  Result Value Ref Range   Glucose-Capillary 158 (H) 70 - 99 mg/dL    Comment: Glucose reference range applies only to samples taken after fasting for at least 8 hours.  Comprehensive metabolic panel     Status: Abnormal   Collection Time: 05/01/20  5:51 AM  Result Value Ref Range   Sodium 135 135 - 145 mmol/L   Potassium 3.9 3.5 - 5.1 mmol/L   Chloride 107 98 - 111 mmol/L   CO2 22 22 - 32 mmol/L   Glucose, Bld 159 (H) 70 - 99 mg/dL    Comment: Glucose reference range applies only to samples taken after fasting for at least 8 hours.   BUN 15 8 - 23 mg/dL   Creatinine, Ser 1.06 (H) 0.44 - 1.00 mg/dL   Calcium 8.3 (L) 8.9 - 10.3 mg/dL   Total Protein 5.5 (L) 6.5 - 8.1 g/dL   Albumin 2.6 (L) 3.5 - 5.0 g/dL   AST 26 15 - 41 U/L   ALT 17 0 - 44 U/L   Alkaline Phosphatase 80 38 - 126 U/L   Total Bilirubin 1.2 0.3 - 1.2 mg/dL   GFR, Estimated 50 (L) >60 mL/min   Anion gap 6 5 - 15    Comment: Performed at [facility], [place] [address], [place], [place] [address]  Glucose, capillary     Status: Abnormal   Collection Time: 05/01/20  8:24 AM  Result Value Ref Range   Glucose-Capillary 166 (H) 70 - 99 mg/dL    Comment: Glucose reference range applies only to samples taken after fasting for at least 8 hours.  CBC with Differential/Platelet     Status: Abnormal   Collection Time: 05/01/20  8:33 AM  Result Value Ref Range   WBC 5.8 4.0 - 10.5 K/uL   RBC 3.49 (L) 3.87 - 5.11 MIL/uL   Hemoglobin 10.5 (L) 12.0 - 15.0 g/dL   HCT 32.0 (L) 36 - 46 %   MCV 91.7 80.0 - 100.0 fL   MCH 30.1 26.0 - 34.0 pg   MCHC 32.8 30.0 - 36.0 g/dL   RDW 15.1 11.5 - 15.5 %   Platelets 109 (L) 150 - 400 K/uL    Comment: Immature Platelet Fraction may be clinically indicated, consider ordering this additional test [id number] CONSISTENT WITH PREVIOUS RESULT    nRBC 0.0 0.0 - 0.2 %   Neutrophils Relative % 68 %   Neutro Abs 3.9 1.7 - 7.7 K/uL   Lymphocytes Relative 23 %   Lymphs Abs 1.3 0.7 - 4.0 K/uL   Monocytes Relative 7 %   Monocytes Absolute 0.4 0.1 - 1.0 K/uL   Eosinophils Relative 1 %   Eosinophils Absolute 0.0 0.0 - 0.5 K/uL   Basophils Relative 0 %   Basophils Absolute  0.0 0.0 - 0.1 K/uL   WBC Morphology TOXIC GRANULATION    Immature Granulocytes 1 %   Abs Immature Granulocytes 0.06 0.00 - 0.07 K/uL    Comment: Performed at [facility], [place] [address], [place], [place] [address]  Glucose, capillary     Status: Abnormal   Collection Time: 05/01/20 12:01 PM  Result Value Ref Range   Glucose-Capillary 174 (H) 70 - 99 mg/dL    Comment: Glucose reference range applies only to samples taken after fasting for at least 8 hours.  Glucose, capillary     Status: Abnormal   Collection Time: 05/01/20  4:30 PM  Result Value Ref Range   Glucose-Capillary 170 (H) 70 - 99 mg/dL    Comment: Glucose reference range applies only to samples taken after fasting for at least 8 hours.  Glucose, capillary      Status: Abnormal   Collection Time: 05/01/20  8:42 PM  Result Value Ref Range   Glucose-Capillary 150 (H) 70 - 99 mg/dL    Comment: Glucose reference range applies only to samples taken after fasting for at least 8 hours.  Glucose, capillary     Status: Abnormal   Collection Time: 05/02/20  7:33 AM  Result Value Ref Range   Glucose-Capillary 181 (H) 70 - 99 mg/dL    Comment: Glucose reference range applies only to samples taken after fasting for at least 8 hours.  Glucose, capillary     Status: Abnormal   Collection Time: 05/02/20 11:44 AM  Result Value Ref Range   Glucose-Capillary 153 (H) 70 - 99 mg/dL    Comment: Glucose reference range applies only to samples taken after fasting for at least 8 hours.  Basic metabolic panel     Status: Abnormal   Collection Time: 05/10/20  8:39 AM  Result Value Ref Range   Sodium 140 135 - 145 mmol/L   Potassium 4.4 3.5 - 5.1 mmol/L   Chloride 105 98 - 111 mmol/L   CO2 23 22 - 32 mmol/L   Glucose, Bld 163 (H) 70 - 99 mg/dL    Comment: Glucose reference range applies only to samples taken after fasting for at least 8 hours.   BUN 11 8 - 23 mg/dL   Creatinine, Ser 1.02 (H) 0.44 - 1.00 mg/dL   Calcium 8.6 (L) 8.9 - 10.3 mg/dL   GFR, Estimated 56 (L) >60 mL/min    Comment: (NOTE) Calculated using the CKD-EPI Creatinine Equation (2021)    Anion gap 12 5 - 15    Comment: Performed at [facility], [place] [address], [place], [place] [address]  CBC     Status: Abnormal   Collection Time: 05/10/20  8:39 AM  Result Value Ref Range   WBC 7.7 4.0 - 10.5 K/uL   RBC 3.64 (L) 3.87 - 5.11 MIL/uL   Hemoglobin 10.5 (L) 12.0 - 15.0 g/dL   HCT 33.8 (L) 36 - 46 %   MCV 92.9 80.0 - 100.0 fL   MCH 28.8 26.0 - 34.0 pg   MCHC 31.1 30.0 - 36.0 g/dL   RDW 14.6 11.5 - 15.5 %   Platelets 141 (L) 150 - 400 K/uL   nRBC 0.0 0.0 - 0.2 %    Comment: Performed at [facility], [place] [address], [place], [place] [address]  Troponin  I (High Sensitivity)     Status: None   Collection Time: 05/10/20  8:39 AM  Result Value Ref Range  Troponin I (High Sensitivity) 8 <18 ng/L    Comment: (NOTE) Elevated high sensitivity troponin I (hsTnI) values and significant  changes across serial measurements may suggest ACS but many other  chronic and acute conditions are known to elevate hsTnI results.  Refer to the "Links" section for chest pain algorithms and additional  guidance. Performed at [facility], [place] [address] [place], [place] [address]   D-dimer, quantitative (not at [facility])     Status: Abnormal   Collection Time: 05/10/20  8:39 AM  Result Value Ref Range   D-Dimer, Quant 4.16 (H) 0.00 - 0.50 ug/mL-FEU    Comment: (NOTE) At the manufacturer cut-off value of 0.5 g/mL FEU, this assay has a negative predictive value of 95-100%.This assay is intended for use in conjunction with a clinical pretest probability (PTP) assessment model to exclude pulmonary embolism (PE) and deep venous thrombosis (DVT) in outpatients suspected of PE or DVT. Results should be correlated with clinical presentation. Performed at [facility], [place] [address] [place], [place] [address]   Troponin I (High Sensitivity)     Status: None   Collection Time: 05/10/20 11:01 AM  Result Value Ref Range   Troponin I (High Sensitivity) 8 <18 ng/L    Comment: (NOTE) Elevated high sensitivity troponin I (hsTnI) values and significant  changes across serial measurements may suggest ACS but many other  chronic and acute conditions are known to elevate hsTnI results.  Refer to the "Links" section for chest pain algorithms and additional  guidance. Performed at [facility], [place] [address], [place],  [address]   Respiratory Panel by RT PCR (Flu A&B, Covid) - Nasopharyngeal Swab     Status: None   Collection Time: 05/10/20 12:52 PM   Specimen: Nasopharyngeal Swab  Result Value Ref Range    SARS Coronavirus 2 by RT PCR NEGATIVE NEGATIVE    Comment: (NOTE) SARS-CoV-2 target nucleic acids are NOT DETECTED.  The SARS-CoV-2 RNA is generally detectable in upper respiratoy specimens during the acute phase of infection. The lowest concentration of SARS-CoV-2 viral copies this assay can detect is 131 copies/mL. A negative result does not preclude SARS-Cov-2 infection and should not be used as the sole basis for treatment or other patient management decisions. A negative result may occur with  improper specimen collection/handling, submission of specimen other than nasopharyngeal swab, presence of viral mutation(s) within the areas targeted by this assay, and inadequate number of viral copies (<131 copies/mL). A negative result must be combined with clinical observations, patient history, and epidemiological information. The expected result is Negative.  Fact Sheet for Patients:  PinkCheek.be  Fact Sheet for Healthcare Providers:  GravelBags.it  This test is no t yet approved or cleared by the [place] FDA and  has been authorized for detection and/or diagnosis of SARS-CoV-2 by FDA under an Emergency Use Authorization (EUA). This EUA will remain  in effect (meaning this test can be used) for the duration of the COVID-19 declaration under Section 564(b)(1) of the Act, 21 U.S.C. section 360bbb-3(b)(1), unless the authorization is terminated or revoked sooner.     Influenza A by PCR NEGATIVE NEGATIVE   Influenza B by PCR NEGATIVE NEGATIVE    Comment: (NOTE) The Xpert Xpress SARS-CoV-2/FLU/RSV assay is intended as an aid in  the diagnosis of influenza from Nasopharyngeal swab specimens and  should not be used as a sole basis for treatment. Nasal washings and  aspirates are unacceptable for Xpert Xpress SARS-CoV-2/FLU/RSV  testing.  Fact Sheet for Patients: PinkCheek.be  Fact  Sheet for Healthcare Providers: GravelBags.it  This test is not yet approved or cleared by the [place] FDA and  has been authorized for detection and/or diagnosis of SARS-CoV-2 by  FDA under an Emergency Use Authorization (EUA). This EUA will remain  in effect (meaning this test can be used) for the duration of the  Covid-19 declaration under Section 564(b)(1) of the Act, 21  U.S.C. section 360bbb-3(b)(1), unless the authorization is  terminated or revoked. Performed at [facility], [place] [address] [place], [place] [address]   POC occult blood, ED     Status: Abnormal   Collection Time: 05/10/20  4:12 PM  Result Value Ref Range   Fecal Occult Bld POSITIVE (A) NEGATIVE  Basic metabolic panel     Status: Abnormal   Collection Time: 05/11/20  4:22 AM  Result Value Ref Range   Sodium 137 135 - 145 mmol/L   Potassium 4.9 3.5 - 5.1 mmol/L   Chloride 103 98 - 111 mmol/L   CO2 21 (L) 22 - 32 mmol/L   Glucose, Bld 235 (H) 70 - 99 mg/dL    Comment: Glucose reference range applies only to samples taken after fasting for at least 8 hours.   BUN 14 8 - 23 mg/dL   Creatinine, Ser 1.06 (H) 0.44 - 1.00 mg/dL   Calcium 8.7 (L) 8.9 - 10.3 mg/dL   GFR, Estimated 54 (L) >60 mL/min    Comment: (NOTE) Calculated using the CKD-EPI Creatinine Equation (2021)    Anion gap 13 5 - 15    Comment: Performed at [facility], [place] [address], [place], [place] [address]  Triglycerides, Body Fluid     Status: None   Collection Time: 05/11/20  1:17 PM  Result Value Ref Range   Triglycerides, Fluid 21 Not Estab. mg/dL    Comment: (NOTE) The reference intervals and other method performance specifications have not been established for this test. The test result should be integrated into the clinical context for interpretation. The reference interval(s) and other method performance specifications have not been established for this body  fluid. The test result must be integrated into the clinical context for interpretation. Performed At: [facility], [place] [id number] Rush Farmer MD RF:[id number]    Fluid Type-FTRIG PERITONEAL     Comment: Performed at [place] [address] [place], [place] [address] CORRECTED ON 10/27 AT 1329: PREVIOUSLY REPORTED AS CYTO MISC   Body fluid cell count with differential     Status: Abnormal   Collection Time: 05/11/20  1:17 PM  Result Value Ref Range   Fluid Type-FCT PERITONEAL     Comment: CORRECTED ON 10/27 AT 1329: PREVIOUSLY REPORTED AS CYTO MISC   Color, Fluid YELLOW YELLOW   Appearance, Fluid CLEAR CLEAR   Total Nucleated Cell Count, Fluid 332 0 - 1,000 cu mm   Neutrophil Count, Fluid 29 (H) 0 - 25 %   Lymphs, Fluid 44 %   Monocyte-Macrophage-Serous Fluid 27 (L) 50 - 90 %   Eos, Fluid 0 %   Other Cells, Fluid CORRELATE WITH CYTOLOGY. %    Comment: Performed at [facility], [place] [address] [place], [place] [address]  Protein, pleural or peritoneal fluid     Status: None   Collection Time: 05/11/20  1:17 PM  Result Value Ref Range   Total protein, fluid <3.0 g/dL  Comment: (NOTE) No normal range established for this test Results should be evaluated in conjunction with serum values    Fluid Type-FTP PERITONEAL     Comment: Performed at [facility], [place] [address], [place], [place] [address] CORRECTED ON 10/27 AT 1329: PREVIOUSLY REPORTED AS CYTO MISC   PH, Body Fluid     Status: None   Collection Time: 05/11/20  1:17 PM  Result Value Ref Range   pH, Body Fluid 7.4 Not Estab.    Comment: (NOTE) This test was developed and its performance characteristics determined by [facility]. It has not been cleared or approved by the Food and Drug Administration. The reference interval(s) and other method performance specifications have not been established for this body fluid. The  test result must be integrated into the clinical context for interpretation. Performed At: [facility] [place], [place] [id number] Rush Farmer MD UX:[id number]    Source PERITONEAL     Comment: Performed at [place] [address], [place], [place] [address]  Total bilirubin, body fluid     Status: None   Collection Time: 05/11/20  1:17 PM  Result Value Ref Range   Total bilirubin, fluid WRLTPR mg/dL    Comment: (NOTE) Test not performed. Specimen was not adequately protected from light. Amber colored tubes for light protection are available from [employer]. was notified 05/13/2020. Performed At: [facility] [address] [place], [place] [id number] Hilton Sinclair I MD [id number]   Cytology - Non PAP;     Status: None   Collection Time: 05/11/20  1:17 PM  Result Value Ref Range   CYTOLOGY - NON GYN      CYTOLOGY - NON PAP CASE: [id number] PATIENT: Denetria Ibarra Non-Gynecological Cytology Report     Clinical History: Hx colon cancer Specimen Submitted:  A. ASCITES, PARACENTESIS:   FINAL MICROSCOPIC DIAGNOSIS: - Reactive mesothelial cells present - No malignant cells identified  SPECIMEN ADEQUACY: Satisfactory for evaluation  DIAGNOSTIC COMMENTS: Inflammation present.  GROSS: Received is/are 1000 ccs of amber fluid. (CM:cm) Prepared: Smears:  0 Concentration method (Thin Prep):  1 Cell block:  1 Additional studies:  Also included is 1 Hematology slide labeled [id number].      Final Diagnosis performed by Claudette Laws, MD.   Electronically signed 05/12/2020 Technical component performed at [facility], [place] [address] [place], [place] [address].  Professional component performed at [employer], [place] [address], [place], [place] [address].  Immunohistochemistry Technical component (if applicable) was performed at [facility] [address], [place], [place], [place] [address].   IMMUNOHISTOCHEMISTRY DISCLAIMER (if applicable): Some of these immunohistochemical stains may have been developed and the performance characteristics determine by [facility]. Some may not have been cleared or approved by the U.S. Food and Drug Administration. The FDA has determined that such clearance or approval is not necessary. This test is used for clinical purposes. It should not be regarded as investigational or for research. This laboratory is certified under the [place] (CLIA-88) as qualified to perform high complexity clinical laboratory testing.  The controls stained appropriately.   Aerobic/Anaerobic Culture (surgical/deep wound)     Status: None   Collection Time: 05/11/20  1:18 PM   Specimen: PATH Cytology Misc. fluid; Other  Result Value Ref Range   Specimen Description      PERITONEAL Performed  at [facility], [place] [address] [place], [place] [address]    Special Requests      NONE Performed at [facility], [place] [address], [place], [place] [address]    Gram Stain NO WBC SEEN NO ORGANISMS SEEN CYTOSPIN SMEAR     Culture      No growth aerobically or anaerobically. Performed at [facility], [place] [address], [place], [place] [address]    Report Status 05/16/2020 FINAL   Comprehensive metabolic panel     Status: Abnormal   Collection Time: 05/12/20  3:39 AM  Result Value Ref Range   Sodium 138 135 - 145 mmol/L   Potassium 4.0 3.5 - 5.1 mmol/L    Comment: DELTA CHECK NOTED NO VISIBLE HEMOLYSIS    Chloride 105 98 - 111 mmol/L   CO2 23 22 - 32 mmol/L   Glucose, Bld 216 (H) 70 - 99 mg/dL    Comment: Glucose reference range applies only to samples taken after fasting for at least 8 hours.   BUN 25 (H) 8 - 23 mg/dL   Creatinine, Ser 1.15 (H) 0.44 - 1.00 mg/dL   Calcium 8.6 (L) 8.9 - 10.3 mg/dL    Total Protein 5.7 (L) 6.5 - 8.1 g/dL   Albumin 2.5 (L) 3.5 - 5.0 g/dL   AST 27 15 - 41 U/L   ALT 20 0 - 44 U/L   Alkaline Phosphatase 80 38 - 126 U/L   Total Bilirubin 1.2 0.3 - 1.2 mg/dL   GFR, Estimated 49 (L) >60 mL/min    Comment: (NOTE) Calculated using the CKD-EPI Creatinine Equation (2021)    Anion gap 10 5 - 15    Comment: Performed at [facility], [place] [address] [place], [place] [address]  Magnesium     Status: None   Collection Time: 05/12/20  3:39 AM  Result Value Ref Range   Magnesium 1.9 1.7 - 2.4 mg/dL    Comment: Performed at [facility], [place] [address] [place], [place] [address]  Phosphorus     Status: None   Collection Time: 05/12/20  3:39 AM  Result Value Ref Range   Phosphorus 3.6 2.5 - 4.6 mg/dL    Comment: Performed at [facility], [place] [address], [place],  [address]  CBC with Differential/Platelet     Status: Abnormal   Collection Time: 05/12/20  3:39 AM  Result Value Ref Range   WBC 9.3 4.0 - 10.5 K/uL   RBC 3.50 (L) 3.87 - 5.11 MIL/uL   Hemoglobin 10.3 (L) 12.0 - 15.0 g/dL   HCT 32.6 (L) 36 - 46 %   MCV 93.1 80.0 - 100.0 fL   MCH 29.4 26.0 - 34.0 pg   MCHC 31.6 30.0 - 36.0 g/dL   RDW 14.3 11.5 - 15.5 %   Platelets 145 (L) 150 - 400 K/uL   nRBC 0.0 0.0 - 0.2 %   Neutrophils Relative % 84 %   Neutro Abs 7.8 (H) 1.7 - 7.7 K/uL   Lymphocytes Relative 12 %   Lymphs Abs 1.1 0.7 - 4.0 K/uL   Monocytes Relative 4 %   Monocytes Absolute 0.4 0.1 - 1.0 K/uL   Eosinophils Relative 0 %   Eosinophils Absolute 0.0 0.0 - 0.5 K/uL   Basophils Relative 0 %   Basophils Absolute 0.0 0.0 - 0.1 K/uL   Immature Granulocytes 0 %   Abs Immature Granulocytes 0.02 0.00 - 0.07 K/uL  Comment: Performed at [facility], [place] [address], [place], [place] [address]  Protime-INR     Status: Abnormal   Collection Time: 05/25/20  4:35 PM  Result Value Ref Range   INR 3.0 (H) 0.8 - 1.0  ratio   Prothrombin Time 33.2 (H) 9.6 - 13.1 sec  CBC with Differential/Platelet     Status: Abnormal   Collection Time: 05/25/20  4:35 PM  Result Value Ref Range   WBC 6.9 4.0 - 10.5 K/uL   RBC 3.97 3.87 - 5.11 Mil/uL   Hemoglobin 11.6 (L) 12.0 - 15.0 g/dL   HCT 35.5 (L) 36 - 46 %   MCV 89.5 78.0 - 100.0 fl   MCHC 32.6 30.0 - 36.0 g/dL   RDW 16.1 (H) 11.5 - 15.5 %   Platelets 130.0 (L) 150 - 400 K/uL   Neutrophils Relative % 58.1 43 - 77 %   Lymphocytes Relative 35.1 12 - 46 %   Monocytes Relative 5.1 3 - 12 %   Eosinophils Relative 0.9 0 - 5 %   Basophils Relative 0.8 0 - 3 %   Neutro Abs 4.0 1.4 - 7.7 K/uL   Lymphs Abs 2.4 0.7 - 4.0 K/uL   Monocytes Absolute 0.4 0.1 - 1.0 K/uL   Eosinophils Absolute 0.1 0.0 - 0.7 K/uL   Basophils Absolute 0.1 0.0 - 0.1 K/uL  Comprehensive metabolic panel     Status: Abnormal   Collection Time: 05/25/20  4:35 PM  Result Value Ref Range   Sodium 141 135 - 145 mEq/L   Potassium 4.4 3.5 - 5.1 mEq/L   Chloride 106 96 - 112 mEq/L   CO2 28 19 - 32 mEq/L   Glucose, Bld 92 70 - 99 mg/dL   BUN 14 6 - 23 mg/dL   Creatinine, Ser 1.19 0.40 - 1.20 mg/dL   Total Bilirubin 0.9 0.2 - 1.2 mg/dL   Alkaline Phosphatase 110 39 - 117 U/L   AST 27 0 - 37 U/L   ALT 15 0 - 35 U/L   Total Protein 6.2 6.0 - 8.3 g/dL   Albumin 3.3 (L) 3.5 - 5.2 g/dL   GFR 43.84 (L) >60.00 mL/min    Comment: Calculated using the CKD-EPI Creatinine Equation (2021)   Calcium 8.9 8.4 - 10.5 mg/dL    Assessment/Plan: 1. Gastroesophageal reflux disease, unspecified whether esophagitis present We will have her continue management per specialist with the following exceptions: We will add on nightly famotidine 20 mg and daily probiotic.  2. Cirrhosis of liver with ascites, unspecified hepatic cirrhosis type ([place]) Euvolemic on examination today.  Continue management per gastroenterology.  3. Stage 3a chronic kidney disease ([place]) Repeat BMP today. - Basic metabolic panel  4. Iron  deficiency anemia, unspecified iron deficiency anemia type Noted some darker stools previously but unsure if this is related to her iron supplementation.  Giving she is on anticoagulation for DVT, EGD and colonoscopy were postponed.  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  Will reassess CBC level today to ensure this is stabilizing or improving.  If declining will need urgent reevaluation with her gastroenterologist.  Strict ER precautions discussed with patient. - CBC w/Diff  This visit occurred during the SARS-CoV-2 public health emergency.  Safety protocols were in place, including screening questions prior to the visit, additional usage of staff PPE, and extensive cleaning of exam room while observing appropriate contact time as indicated for disinfecting solutions.     Gwyndolyn Saxon  Elyn Aquas, PA-C

## 2020-06-06 NOTE — Telephone Encounter (Signed)
Please advise. Was prescribed by Barb Merino MD on 05/12/20 #51 starter pack.

## 2020-06-06 NOTE — Telephone Encounter (Signed)
Rx sent to pharmacy   

## 2020-06-14 ENCOUNTER — Other Ambulatory Visit: Payer: Self-pay

## 2020-06-14 ENCOUNTER — Ambulatory Visit (INDEPENDENT_AMBULATORY_CARE_PROVIDER_SITE_OTHER): Payer: Medicare Other

## 2020-06-14 DIAGNOSIS — D649 Anemia, unspecified: Secondary | ICD-10-CM

## 2020-06-14 LAB — CBC WITH DIFFERENTIAL/PLATELET
Basophils Absolute: 0 10*3/uL (ref 0.0–0.1)
Basophils Relative: 0.5 % (ref 0.0–3.0)
Eosinophils Absolute: 0 10*3/uL (ref 0.0–0.7)
Eosinophils Relative: 0.5 % (ref 0.0–5.0)
HCT: 27.4 % — ABNORMAL LOW (ref 36.0–46.0)
Hemoglobin: 8.6 g/dL — ABNORMAL LOW (ref 12.0–15.0)
Lymphocytes Relative: 41.8 % (ref 12.0–46.0)
Lymphs Abs: 1.9 10*3/uL (ref 0.7–4.0)
MCHC: 31.5 g/dL (ref 30.0–36.0)
MCV: 92.4 fl (ref 78.0–100.0)
Monocytes Absolute: 0.3 10*3/uL (ref 0.1–1.0)
Monocytes Relative: 6.5 % (ref 3.0–12.0)
Neutro Abs: 2.3 10*3/uL (ref 1.4–7.7)
Neutrophils Relative %: 50.7 % (ref 43.0–77.0)
Platelets: 119 10*3/uL — ABNORMAL LOW (ref 150.0–400.0)
RBC: 2.97 Mil/uL — ABNORMAL LOW (ref 3.87–5.11)
RDW: 18.6 % — ABNORMAL HIGH (ref 11.5–15.5)
WBC: 4.5 10*3/uL (ref 4.0–10.5)

## 2020-06-15 ENCOUNTER — Inpatient Hospital Stay (HOSPITAL_COMMUNITY)
Admission: EM | Admit: 2020-06-15 | Discharge: 2020-06-18 | DRG: 378 | Disposition: A | Discharge status: Home / Self Care | Payer: Medicare Other | Attending: Internal Medicine | Admitting: Internal Medicine

## 2020-06-15 ENCOUNTER — Encounter (HOSPITAL_COMMUNITY): Payer: Self-pay | Admitting: *Deleted

## 2020-06-15 DIAGNOSIS — M810 Age-related osteoporosis without current pathological fracture: Secondary | ICD-10-CM | POA: Diagnosis present

## 2020-06-15 DIAGNOSIS — K7581 Nonalcoholic steatohepatitis (NASH): Secondary | ICD-10-CM | POA: Diagnosis present

## 2020-06-15 DIAGNOSIS — E669 Obesity, unspecified: Secondary | ICD-10-CM | POA: Diagnosis present

## 2020-06-15 DIAGNOSIS — Z20822 Contact with and (suspected) exposure to covid-19: Secondary | ICD-10-CM | POA: Diagnosis present

## 2020-06-15 DIAGNOSIS — F419 Anxiety disorder, unspecified: Secondary | ICD-10-CM | POA: Diagnosis present

## 2020-06-15 DIAGNOSIS — Z7984 Long term (current) use of oral hypoglycemic drugs: Secondary | ICD-10-CM

## 2020-06-15 DIAGNOSIS — Z7901 Long term (current) use of anticoagulants: Secondary | ICD-10-CM

## 2020-06-15 DIAGNOSIS — K621 Rectal polyp: Secondary | ICD-10-CM | POA: Diagnosis present

## 2020-06-15 DIAGNOSIS — Z6833 Body mass index (BMI) 33.0-33.9, adult: Secondary | ICD-10-CM

## 2020-06-15 DIAGNOSIS — Z9049 Acquired absence of other specified parts of digestive tract: Secondary | ICD-10-CM

## 2020-06-15 DIAGNOSIS — K642 Third degree hemorrhoids: Secondary | ICD-10-CM | POA: Diagnosis present

## 2020-06-15 DIAGNOSIS — Z86718 Personal history of other venous thrombosis and embolism: Secondary | ICD-10-CM

## 2020-06-15 DIAGNOSIS — E872 Acidosis: Secondary | ICD-10-CM | POA: Diagnosis present

## 2020-06-15 DIAGNOSIS — E1122 Type 2 diabetes mellitus with diabetic chronic kidney disease: Secondary | ICD-10-CM | POA: Diagnosis present

## 2020-06-15 DIAGNOSIS — K635 Polyp of colon: Secondary | ICD-10-CM | POA: Diagnosis present

## 2020-06-15 DIAGNOSIS — K766 Portal hypertension: Secondary | ICD-10-CM | POA: Diagnosis present

## 2020-06-15 DIAGNOSIS — R195 Other fecal abnormalities: Secondary | ICD-10-CM | POA: Diagnosis not present

## 2020-06-15 DIAGNOSIS — N1831 Chronic kidney disease, stage 3a: Secondary | ICD-10-CM | POA: Diagnosis present

## 2020-06-15 DIAGNOSIS — D649 Anemia, unspecified: Secondary | ICD-10-CM | POA: Diagnosis not present

## 2020-06-15 DIAGNOSIS — R160 Hepatomegaly, not elsewhere classified: Secondary | ICD-10-CM

## 2020-06-15 DIAGNOSIS — D509 Iron deficiency anemia, unspecified: Secondary | ICD-10-CM | POA: Diagnosis present

## 2020-06-15 DIAGNOSIS — K922 Gastrointestinal hemorrhage, unspecified: Secondary | ICD-10-CM | POA: Diagnosis not present

## 2020-06-15 DIAGNOSIS — D696 Thrombocytopenia, unspecified: Secondary | ICD-10-CM | POA: Diagnosis present

## 2020-06-15 DIAGNOSIS — Z66 Do not resuscitate: Secondary | ICD-10-CM | POA: Diagnosis present

## 2020-06-15 DIAGNOSIS — K31811 Angiodysplasia of stomach and duodenum with bleeding: Principal | ICD-10-CM | POA: Diagnosis present

## 2020-06-15 DIAGNOSIS — I8510 Secondary esophageal varices without bleeding: Secondary | ICD-10-CM | POA: Diagnosis present

## 2020-06-15 DIAGNOSIS — I129 Hypertensive chronic kidney disease with stage 1 through stage 4 chronic kidney disease, or unspecified chronic kidney disease: Secondary | ICD-10-CM | POA: Diagnosis present

## 2020-06-15 DIAGNOSIS — Z79899 Other long term (current) drug therapy: Secondary | ICD-10-CM

## 2020-06-15 DIAGNOSIS — Z79890 Hormone replacement therapy (postmenopausal): Secondary | ICD-10-CM

## 2020-06-15 DIAGNOSIS — E039 Hypothyroidism, unspecified: Secondary | ICD-10-CM | POA: Diagnosis present

## 2020-06-15 DIAGNOSIS — I152 Hypertension secondary to endocrine disorders: Secondary | ICD-10-CM

## 2020-06-15 DIAGNOSIS — T182XXA Foreign body in stomach, initial encounter: Secondary | ICD-10-CM | POA: Diagnosis not present

## 2020-06-15 DIAGNOSIS — K644 Residual hemorrhoidal skin tags: Secondary | ICD-10-CM | POA: Diagnosis present

## 2020-06-15 DIAGNOSIS — K7460 Unspecified cirrhosis of liver: Secondary | ICD-10-CM | POA: Diagnosis present

## 2020-06-15 DIAGNOSIS — K219 Gastro-esophageal reflux disease without esophagitis: Secondary | ICD-10-CM | POA: Diagnosis present

## 2020-06-15 DIAGNOSIS — Z905 Acquired absence of kidney: Secondary | ICD-10-CM

## 2020-06-15 DIAGNOSIS — Z85038 Personal history of other malignant neoplasm of large intestine: Secondary | ICD-10-CM

## 2020-06-15 DIAGNOSIS — Z9221 Personal history of antineoplastic chemotherapy: Secondary | ICD-10-CM

## 2020-06-15 DIAGNOSIS — N179 Acute kidney failure, unspecified: Secondary | ICD-10-CM | POA: Diagnosis present

## 2020-06-15 LAB — RESP PANEL BY RT-PCR (FLU A&B, COVID) ARPGX2
Influenza A by PCR: NEGATIVE
Influenza B by PCR: NEGATIVE
SARS Coronavirus 2 by RT PCR: NEGATIVE

## 2020-06-15 LAB — CBC WITH DIFFERENTIAL/PLATELET
Abs Immature Granulocytes: 0.01 10*3/uL (ref 0.00–0.07)
Basophils Absolute: 0 10*3/uL (ref 0.0–0.1)
Basophils Relative: 0 %
Eosinophils Absolute: 0 10*3/uL (ref 0.0–0.5)
Eosinophils Relative: 1 %
HCT: 28.6 % — ABNORMAL LOW (ref 36.0–46.0)
Hemoglobin: 8.4 g/dL — ABNORMAL LOW (ref 12.0–15.0)
Immature Granulocytes: 0 %
Lymphocytes Relative: 31 %
Lymphs Abs: 1.5 10*3/uL (ref 0.7–4.0)
MCH: 29 pg (ref 26.0–34.0)
MCHC: 29.4 g/dL — ABNORMAL LOW (ref 30.0–36.0)
MCV: 98.6 fL (ref 80.0–100.0)
Monocytes Absolute: 0.3 10*3/uL (ref 0.1–1.0)
Monocytes Relative: 6 %
Neutro Abs: 3 10*3/uL (ref 1.7–7.7)
Neutrophils Relative %: 62 %
Platelets: 127 10*3/uL — ABNORMAL LOW (ref 150–400)
RBC: 2.9 MIL/uL — ABNORMAL LOW (ref 3.87–5.11)
RDW: 17.2 % — ABNORMAL HIGH (ref 11.5–15.5)
WBC: 4.8 10*3/uL (ref 4.0–10.5)
nRBC: 0 % (ref 0.0–0.2)

## 2020-06-15 LAB — COMPREHENSIVE METABOLIC PANEL
ALT: 15 U/L (ref 0–44)
AST: 34 U/L (ref 15–41)
Albumin: 3.2 g/dL — ABNORMAL LOW (ref 3.5–5.0)
Alkaline Phosphatase: 72 U/L (ref 38–126)
Anion gap: 9 (ref 5–15)
BUN: 21 mg/dL (ref 8–23)
CO2: 23 mmol/L (ref 22–32)
Calcium: 8.8 mg/dL — ABNORMAL LOW (ref 8.9–10.3)
Chloride: 110 mmol/L (ref 98–111)
Creatinine, Ser: 1.31 mg/dL — ABNORMAL HIGH (ref 0.44–1.00)
GFR, Estimated: 42 mL/min — ABNORMAL LOW (ref >60)
Glucose, Bld: 169 mg/dL — ABNORMAL HIGH (ref 70–99)
Potassium: 4.7 mmol/L (ref 3.5–5.1)
Sodium: 142 mmol/L (ref 135–145)
Total Bilirubin: 1 mg/dL (ref 0.3–1.2)
Total Protein: 6.2 g/dL — ABNORMAL LOW (ref 6.5–8.1)

## 2020-06-15 LAB — TROPONIN I (HIGH SENSITIVITY): Troponin I (High Sensitivity): 4 ng/L (ref <18)

## 2020-06-15 LAB — PREPARE RBC (CROSSMATCH)

## 2020-06-15 LAB — HEMOGLOBIN AND HEMATOCRIT, BLOOD
HCT: 30.7 % — ABNORMAL LOW (ref 36.0–46.0)
HCT: 27.9 % — ABNORMAL LOW (ref 36.0–46.0)
Hemoglobin: 9.4 g/dL — ABNORMAL LOW (ref 12.0–15.0)
Hemoglobin: 8.6 g/dL — ABNORMAL LOW (ref 12.0–15.0)

## 2020-06-15 LAB — GLUCOSE, CAPILLARY
Glucose-Capillary: 111 mg/dL — ABNORMAL HIGH (ref 70–99)
Glucose-Capillary: 101 mg/dL — ABNORMAL HIGH (ref 70–99)

## 2020-06-15 LAB — PROTIME-INR
INR: 1.3 — ABNORMAL HIGH (ref 0.8–1.2)
Prothrombin Time: 15.4 seconds — ABNORMAL HIGH (ref 11.4–15.2)

## 2020-06-15 MED ORDER — SPIRONOLACTONE 25 MG PO TABS
50.0000 mg | ORAL_TABLET | Freq: Every day | ORAL | Status: DC
  Filled 2020-06-15: qty 2

## 2020-06-15 MED ORDER — LEVOTHYROXINE SODIUM 88 MCG PO TABS
88.0000 ug | ORAL_TABLET | Freq: Every day | ORAL | Status: DC
  Administered 2020-06-16 – 2020-06-18 (×3): 88 ug via ORAL
  Filled 2020-06-15 (×3): qty 1

## 2020-06-15 MED ORDER — PEG-KCL-NACL-NASULF-NA ASC-C 100 G PO SOLR
0.5000 | Freq: Two times a day (BID) | ORAL | Status: AC
  Administered 2020-06-16 – 2020-06-17 (×2): 100 g via ORAL
  Filled 2020-06-15: qty 1

## 2020-06-15 MED ORDER — SODIUM CHLORIDE 0.9% IV SOLUTION: Freq: Once | INTRAVENOUS | Status: AC

## 2020-06-15 MED ORDER — ONDANSETRON HCL 4 MG PO TABS: 4.0000 mg | ORAL_TABLET | Freq: Four times a day (QID) | ORAL | Status: DC | PRN

## 2020-06-15 MED ORDER — ONDANSETRON HCL 4 MG/2ML IJ SOLN: 4.0000 mg | Freq: Four times a day (QID) | INTRAMUSCULAR | Status: DC | PRN

## 2020-06-15 MED ORDER — ACETAMINOPHEN 325 MG PO TABS: 650.0000 mg | ORAL_TABLET | Freq: Four times a day (QID) | ORAL | Status: DC | PRN

## 2020-06-15 MED ORDER — INSULIN ASPART 100 UNIT/ML ~~LOC~~ SOLN
0.0000 [IU] | Freq: Three times a day (TID) | SUBCUTANEOUS | Status: DC
  Administered 2020-06-16 – 2020-06-18 (×4): 1 [IU] via SUBCUTANEOUS

## 2020-06-15 MED ORDER — ATENOLOL 50 MG PO TABS
50.0000 mg | ORAL_TABLET | Freq: Every day | ORAL | Status: DC
  Administered 2020-06-16 – 2020-06-17 (×2): 50 mg via ORAL
  Filled 2020-06-15 (×2): qty 1

## 2020-06-15 MED ORDER — SODIUM CHLORIDE 0.9 % IV SOLN
1.0000 g | Freq: Once | INTRAVENOUS | Status: AC
  Administered 2020-06-15: 1 g via INTRAVENOUS
  Filled 2020-06-15: qty 10

## 2020-06-15 MED ORDER — POLYETHYLENE GLYCOL 3350 17 G PO PACK: 17.0000 g | PACK | Freq: Every day | ORAL | Status: DC | PRN

## 2020-06-15 MED ORDER — IRBESARTAN 150 MG PO TABS
150.0000 mg | ORAL_TABLET | Freq: Two times a day (BID) | ORAL | Status: DC
Start: 2020-06-15 — End: 2020-06-15

## 2020-06-15 MED ORDER — FUROSEMIDE 40 MG PO TABS
40.0000 mg | ORAL_TABLET | Freq: Every day | ORAL | Status: DC
  Filled 2020-06-15: qty 1

## 2020-06-15 MED ORDER — SODIUM CHLORIDE 0.9 % IV SOLN
2.0000 g | INTRAVENOUS | Status: DC
  Administered 2020-06-16 – 2020-06-18 (×3): 2 g via INTRAVENOUS
  Filled 2020-06-15 (×3): qty 2

## 2020-06-15 MED ORDER — ACETAMINOPHEN 650 MG RE SUPP: 650.0000 mg | Freq: Four times a day (QID) | RECTAL | Status: DC | PRN

## 2020-06-15 MED ORDER — IRBESARTAN 75 MG PO TABS
75.0000 mg | ORAL_TABLET | Freq: Two times a day (BID) | ORAL | Status: DC
  Administered 2020-06-15 – 2020-06-18 (×6): 75 mg via ORAL
  Filled 2020-06-15 (×6): qty 1

## 2020-06-15 MED ORDER — AMLODIPINE BESYLATE 5 MG PO TABS
2.5000 mg | ORAL_TABLET | Freq: Every day | ORAL | Status: DC
  Administered 2020-06-16 – 2020-06-18 (×3): 2.5 mg via ORAL
  Filled 2020-06-15 (×3): qty 1

## 2020-06-15 MED ORDER — LORAZEPAM 0.5 MG PO TABS
0.5000 mg | ORAL_TABLET | Freq: Two times a day (BID) | ORAL | Status: DC | PRN
  Administered 2020-06-15 – 2020-06-17 (×2): 0.5 mg via ORAL
  Filled 2020-06-15 (×2): qty 1

## 2020-06-15 MED ORDER — PANTOPRAZOLE SODIUM 40 MG IV SOLR
40.0000 mg | Freq: Two times a day (BID) | INTRAVENOUS | Status: DC
  Administered 2020-06-15 – 2020-06-17 (×5): 40 mg via INTRAVENOUS
  Filled 2020-06-15 (×5): qty 40

## 2020-06-15 MED ORDER — FERROUS SULFATE 325 (65 FE) MG PO TABS
325.0000 mg | ORAL_TABLET | Freq: Every day | ORAL | Status: DC
  Administered 2020-06-16: 325 mg via ORAL
  Filled 2020-06-15: qty 1

## 2020-06-15 NOTE — ED Provider Notes (Signed)
[name] Provider Note   CSN: [id number] Arrival date & time: 06/15/20  0846     History Chief Complaint  Patient presents with  . Abnormal Lab    low hemoglobin    Cheryl Jordan is a 79 y.o. female.  79 yo F with a cc of fatigue.  Patient has been having worsening fatigue over the past couple weeks or so.  Was recently in the hospital for a "GI bug "she had a paracentesis done at that time and had fluid pulled off of her abdomen.  History of cirrhosis.  Was found to have an acute right-sided DVT.  Started on Xarelto.  Since then the patient had bloody stools and now has progressed to a more dark stools.  States that they are very dark and tarry at home.  She has had some worsening fatigue and had difficulty even making dinner or doing the dishes has to stop and take a break to catch her breath.  She denies any abdominal discomfort.  Denied chest pain or pressure.  The history is provided by the patient.  Abnormal Lab Illness Severity:  Moderate Onset quality:  Gradual Duration:  2 weeks Timing:  Constant Progression:  Worsening Chronicity:  New Associated symptoms: no abdominal pain, no chest pain, no congestion, no fever, no headaches, no myalgias, no nausea, no rhinorrhea, no shortness of breath, no vomiting and no wheezing        Past Medical History:  Diagnosis Date  . Allergy   . Anemia    hx of  . Anxiety   . Arthritis   . Colon cancer ([place]) dx'd 11/2014  . Diabetes mellitus without complication ([place])    TYPE II  . Fatty liver   . Gallbladder disease   . GERD (gastroesophageal reflux disease)   . History of gout   . Hypertension   . Hypothyroidism   . Kidney disorder    spot on left kidney  . Osteoporosis   . PONV (postoperative nausea and vomiting)     Patient Active Problem List   Diagnosis Date Noted  . SOB (shortness of breath) 05/10/2020  . Anemia 05/10/2020  . Enteritis 04/29/2020  . AKI (acute kidney injury)  ([place]) 04/29/2020  . Abnormal finding on imaging of liver 02/01/2019  . Chronic kidney disease (CKD), stage III (moderate) ([place]) 12/09/2018  . Malignant neoplasm of colon ([place]) 08/11/2015  . 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 Adenocarcinoma of colon ([place]) 01/05/2015  . Hypertension associated with diabetes ([place]) 12/17/2013  . Arthritis 12/17/2013  . Hypothyroidism 10/16/2011  . GERD (gastroesophageal reflux disease) 10/16/2011    Past Surgical History:  Procedure Laterality Date  . CHOLECYSTECTOMY    . IR RADIOLOGIST EVAL & MGMT  01/24/2017  . KNEE CARTILAGE SURGERY Bilateral   . NEPHRECTOMY Left 02/10/2015   Procedure: OPEN RETROPERINTONEAL EXPLORATION LEFT RENAL  CYST DECORTICATION X 5;  Surgeon: Cleon Gustin, MD;  Location: [facility];  Service: Urology;  Laterality: Left;  . PARTIAL COLECTOMY N/A 02/10/2015   Procedure: OPEN RIGHT  COLECTOMY ;  Surgeon: [name], MD;  Location: [facility];  Service: General;  Laterality: N/A;  . TUBAL LIGATION       OB History   No obstetric history on file.     Family History  Problem Relation Age of Onset  . Diabetes Mother   . Hypertension Other   . COPD Father   . Hypertension Sister   .  GER disease Sister   . Heart attack Brother   . COPD Brother   . Lung cancer Brother   . COPD Brother   . Hypertension Brother     Social History   Tobacco Use  . Smoking status: Never Smoker  . Smokeless tobacco: Never Used  Vaping Use  . Vaping Use: Never used  Substance Use Topics  . Alcohol use: No  . Drug use: No    Home Medications Prior to Admission medications   Medication Sig Start Date End Date Taking? Authorizing Provider  ACCU-CHEK AVIVA PLUS test strip  10/20/18   [provider]  acetaminophen (TYLENOL) 500 MG tablet Take 500 mg by mouth every 6 (six) hours as needed for moderate pain.    [provider]  amLODipine (NORVASC) 2.5 MG tablet Take 1 tablet (2.5 mg total) by mouth daily.  12/04/19   Brunetta Jeans, PA-C  atenolol (TENORMIN) 50 MG tablet TAKE 1 TABLET BY MOUTH IN  THE MORNING Patient taking differently: Take 50 mg by mouth daily.  02/03/20   Brunetta Jeans, PA-C  Blood Glucose Monitoring Suppl (ACCU-CHEK AVIVA) device Use as instructed 07/03/19 07/02/20  Brunetta Jeans, PA-C  ferrous sulfate 325 (65 FE) MG EC tablet Take 1 tablet (325 mg total) by mouth 2 (two) times daily with a meal. Patient taking differently: Take 325 mg by mouth daily.  02/02/20   Brunetta Jeans, PA-C  furosemide (LASIX) 40 MG tablet Take 1 tablet (40 mg total) by mouth daily. 05/25/20   [name], MD  glipiZIDE (GLUCOTROL XL) 5 MG 24 hr tablet TAKE (1) TABLET DAILY WITH BREAKFAST. Patient taking differently: Take 5 mg by mouth daily.  12/03/19   Brunetta Jeans, PA-C  levothyroxine (SYNTHROID) 88 MCG tablet Take 1 tablet (88 mcg total) by mouth daily. 12/04/19   Brunetta Jeans, PA-C  loperamide (IMODIUM) 2 MG capsule Take 2 capsules by mouth once. Then take additional 2mg  capsule as needed for continued diarrhea. No more than 16 mg in 24 hours. Patient taking differently: Take 2 mg by mouth as needed for diarrhea or loose stools. Take 2 capsules by mouth once. Then take additional 2mg  capsule as needed for continued diarrhea. No more than 16 mg in 24 hours. 04/27/20   Brunetta Jeans, PA-C  LORazepam (ATIVAN) 0.5 MG tablet Take 1 tablet (0.5 mg total) by mouth 2 (two) times daily as needed for anxiety. 06/06/20   Brunetta Jeans, PA-C  omeprazole (PRILOSEC) 20 MG capsule Take 1 capsule (20 mg total) by mouth daily. 12/04/19   Brunetta Jeans, PA-C  ondansetron (ZOFRAN) 8 MG tablet Take 1 tablet (8 mg total) by mouth every 8 (eight) hours as needed for nausea or vomiting. 06/06/20   Brunetta Jeans, PA-C  rivaroxaban (XARELTO) 20 MG TABS tablet Take 1 tablet (20 mg total) by mouth daily with supper. 06/06/20   Brunetta Jeans, PA-C  spironolactone (ALDACTONE) 50 MG tablet  Take 1 tablet (50 mg total) by mouth daily. 05/25/20   [name], MD  telmisartan (MICARDIS) 40 MG tablet Take 1 tablet (40 mg total) by mouth daily. Patient taking differently: Take 20 mg by mouth in the morning and at bedtime.  03/01/20   Brunetta Jeans, PA-C    Allergies    Aspirin and Codeine  Review of Systems   Review of Systems  Constitutional: Negative for chills and fever.  HENT: Negative for congestion and rhinorrhea.  Eyes: Negative for redness and visual disturbance.  Respiratory: Negative for shortness of breath and wheezing.   Cardiovascular: Negative for chest pain and palpitations.  Gastrointestinal: Positive for blood in stool. Negative for abdominal pain, nausea and vomiting.  Genitourinary: Negative for dysuria and urgency.  Musculoskeletal: Negative for arthralgias and myalgias.  Skin: Negative for pallor and wound.  Neurological: Negative for dizziness and headaches.    Physical Exam Updated Vital Signs BP (!) 146/61   Pulse 65   Temp 97.7 F (36.5 C) (Oral)   Resp 16   Ht 5\' 7"  (1.702 m)   Wt 97.5 kg   SpO2 99%   BMI 33.67 kg/m   Physical Exam Vitals and nursing note reviewed.  Constitutional:      General: She is not in acute distress.    Appearance: She is well-developed. She is not diaphoretic.     Comments: pale  HENT:     Head: Normocephalic and atraumatic.  Eyes:     Pupils: Pupils are equal, round, and reactive to light.  Cardiovascular:     Rate and Rhythm: Normal rate and regular rhythm.     Heart sounds: No murmur heard.  No friction rub. No gallop.   Pulmonary:     Effort: Pulmonary effort is normal.     Breath sounds: No wheezing or rales.  Abdominal:     General: There is no distension.     Palpations: Abdomen is soft.     Tenderness: There is no abdominal tenderness.  Musculoskeletal:        General: No tenderness.     Cervical back: Normal range of motion and neck supple.  Skin:    General: Skin is warm and dry.    Neurological:     Mental Status: She is alert and oriented to person, place, and time.  Psychiatric:        Behavior: Behavior normal.     ED Results / Procedures / Treatments   Labs (all labs ordered are listed, but only abnormal results are displayed) Labs Reviewed  CBC WITH DIFFERENTIAL/PLATELET - Abnormal; Notable for the following components:      Result Value   RBC 2.90 (*)    Hemoglobin 8.4 (*)    HCT 28.6 (*)    MCHC 29.4 (*)    RDW 17.2 (*)    Platelets 127 (*)    All other components within normal limits  COMPREHENSIVE METABOLIC PANEL - Abnormal; Notable for the following components:   Glucose, Bld 169 (*)    Creatinine, Ser 1.31 (*)    Calcium 8.8 (*)    Total Protein 6.2 (*)    Albumin 3.2 (*)    GFR, Estimated 42 (*)    All other components within normal limits  PROTIME-INR - Abnormal; Notable for the following components:   Prothrombin Time 15.4 (*)    INR 1.3 (*)    All other components within normal limits  RESP PANEL BY RT-PCR (FLU A&B, COVID) ARPGX2  TYPE AND SCREEN  PREPARE RBC (CROSSMATCH)  TROPONIN I (HIGH SENSITIVITY)    EKG EKG Interpretation  Date/Time:  Wednesday June 15 2020 10:05:51 EST Ventricular Rate:  66 PR Interval:    QRS Duration: 80 QT Interval:  428 QTC Calculation: 449 R Axis:   -4 Text Interpretation: Sinus rhythm Abnormal R-wave progression, early transition LVH by voltage Borderline T abnormalities, anterior leads No significant change since last tracing Confirmed by Deno Etienne [id number]) on 06/15/2020 10:32:17 AM  Radiology No results found.  Procedures Procedures (including critical care time)  Medications Ordered in ED Medications  0.9 %  sodium chloride infusion (Manually program via Guardrails IV Fluids) (has no administration in time range)  pantoprazole (PROTONIX) injection 40 mg (40 mg Intravenous Given 06/15/20 0942)  cefTRIAXone (ROCEPHIN) 1 g in sodium chloride 0.9 % 100 mL IVPB (0 g Intravenous Stopped  06/15/20 1030)    ED Course  I have reviewed the triage vital signs and the nursing notes.  Pertinent labs & imaging results that were available during my care of the patient were reviewed by me and considered in my medical decision making (see chart for details).    MDM Rules/Calculators/A&P                          79 yo F with a chief complaint of worsening fatigue.  Patient has been having her hemoglobin checked in the office and its been downtrending for the past week or so.  Hemoglobin of 11 about 20 days ago.  Checked yesterday in the office now is 8.6.  With her worsening fatigue her family doctor recommended she come to the hospital for transfusion.  With patient describing bloody stools for the past couple weeks and on Xarelto I think she likely needs come to the hospital.  Will discuss with GI.  Recheck hemoglobin today.  With our GI to consult on patient.  Hemoglobin continues to downtrend.  Will discuss with medicine for admission.  CRITICAL CARE Performed by: Cecilio Asper   Total critical care time: 35 minutes  Critical care time was exclusive of separately billable procedures and treating other patients.  Critical care was necessary to treat or prevent imminent or life-threatening deterioration.  Critical care was time spent personally by me on the following activities: development of treatment plan with patient and/or surrogate as well as nursing, discussions with consultants, evaluation of patient's response to treatment, examination of patient, obtaining history from patient or surrogate, ordering and performing treatments and interventions, ordering and review of laboratory studies, ordering and review of radiographic studies, pulse oximetry and re-evaluation of patient's condition.  The patients results and plan were reviewed and discussed.   Any x-rays performed were independently reviewed by myself.   Differential diagnosis were considered with the  presenting HPI.  Medications  0.9 %  sodium chloride infusion (Manually program via Guardrails IV Fluids) (has no administration in time range)  pantoprazole (PROTONIX) injection 40 mg (40 mg Intravenous Given 06/15/20 0942)  cefTRIAXone (ROCEPHIN) 1 g in sodium chloride 0.9 % 100 mL IVPB (0 g Intravenous Stopped 06/15/20 1030)    Vitals:   06/15/20 0855 06/15/20 0906 06/15/20 0930 06/15/20 1015  BP: (!) 142/61  (!) 141/61 (!) 146/61  Pulse: 69  67 65  Resp: 16  17 16   Temp: 97.7 F (36.5 C)     TempSrc: Oral     SpO2: 93%  98% 99%  Weight:  97.5 kg    Height:  5\' 7"  (1.702 m)      Final diagnoses:  Symptomatic anemia  Acute gastrointestinal bleeding    Admission/ observation were discussed with the admitting physician, patient and/or family and they are comfortable with the plan.    Final Clinical Impression(s) / ED Diagnoses Final diagnoses:  Symptomatic anemia  Acute gastrointestinal bleeding    Rx / DC Orders ED Discharge Orders    None       Deno Etienne,  DO 06/15/20 1033

## 2020-06-15 NOTE — Consult Note (Signed)
Referring Provider:  [facility]         Primary Care Physician:  Cheryl Jeans, PA-C Primary Gastroenterologist:    Cheryl Shorts, MD           We were asked to see this patient for:     Jordan bleed             ASSESSMENT / PLAN:   # 79 yo female with worsening of chronic Cheryl Jordan and Heme positive stools. She was scheduled to have EGD / colonoscopy but procedures not done as patient was hospitalized in late October with decompensated cirrhosis and DVT. Now with worsening Jordan, intermittent rectal bleeding on Xarelto.  She has chronic black stools but is on iron so difficult to sort out. She endorse intermittent nausea x 1 month. The decline in hgb seems out of proportion to the few episodes of self-limited rectal bleeding patient had a few weeks ago.  Rule out recurrent colon cancer, AVMs, PUD, portal gastropathy. Unknown if she has a history of esophageal varices but she doesn't appear to be having an active Jordan bleed --Will most likely need both EGD and colonoscopy this admission. .  .  --She is being transfused a unit of PRBC now.  --BID IV PPI --Though doesn't seem to be actively bleeding right now, agree with Rocephin for SBP prophylaxis. Last paracentesis was 10/27, no SBP --clear liquid diet for now   # Cirrhosis with portal HTN, likely secondary to [place]. No evidence for decompensation at present.  --Platelets 127, INR 1.3 --No abdominal pain. Normal WBC.  --Agree with Rocephin for SBP prophylaxis.  --I have not ordered a diagnostic tap.   # Recent AKI after lasix increased. Cr improved to 1.3 but still elevated.  --Would hold diuretics for now   # History of cecal cancer, stage IIIc.  She is status post resection 2016 followed by adjuvant chemotherapy. No recurrent cancer on last colonoscopy in 2018 with Cheryl Jordan.   # Liver lesion on non-contrast CT scan late October. Lesion concerning for [place]. Patient hasn't felt up to getting MRI --Obtain AFP --Hopefully we can  get MRI this admission  # Chronic postprandial diarrhea.   # Multiple medical problems as listed below.     HPI:                                                                                                                             Chief Complaint:  Jordan bleed  Cheryl Jordan is a 79 y.o. 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  She was previously followed by Cheryl Jordan but due to his impending retirement changed to our  practice.  For evaluation of Jordan patient was scheduled for EGD and colonoscopy however those did not get done as patient was subsequently hospitalized with decompensated cirrhosis and likely gastroenteritis.  She was also diagnosed with a RLL DVT for which she was placed on Xarelto.  She was found to have an enlarging complex right hepatic lobe mass concerning for [name].Marland Kitchen Jordan did not see her during that admission but she did follow-up with Cheryl Jordan in the office on the 10th of this month.  At that visit he increased her Lasix to 40 mg daily, started Aldactone 50 mg daily.  MRI of the liver was discussed but patient was not feeling up to having the test done at that time.  EGD nor colonoscopy were rescheduled.  Patient was continued on iron replacement therapy and PPI.  She was asked to follow-up in the office in 2 months.  She had CBC on 05/25/2020 and hemoglobin was stable at that time at 11.6.  However, follow-up CBC by PCP on 11/22 showed a decline in hemoglobin to 9.3.  Patient was advised to have follow-up labs the following Monday and or go to the ED if she had shortness of breath and/or rectal bleeding.  Due to bump in creatinine, Lasix  was reduced to 20 mg daily.  Patient had repeat CBC yesterday which showed a further decline in hemoglobin from 9.3 to 8.6.  Patient was advised to go to ED for possible blood transfusion.  In the ED patient was hemodynamically stable, hemoglobin 8.4 platelets 127. INR 1.3.  Creatinine improved to 1.3.  She has been transfused a unit of blood this morning.   Patient complains of increasing fatigue of the last couple of weeks.  Since starting on Xarelto 4 weeks ago she has had 2-3 episodes of painless rectal bleeding with BM. No constipation / straining. Stools chronically loose.  She endorses intermittent nausea over the last month. No abdominal pain. No NSAID use.   PREVIOUS ENDOSCOPIC EVALUATIONS / PERTINENT STUDIES   March 2018 Surveillance colonoscopy  --ileocolonic anastomosis looked okay.  --3 mm sessile polyp --Diminutive rectal polyp  Past Medical History:  Diagnosis Date  . Allergy   . Jordan    hx of  . Anxiety   . Arthritis   . Colon cancer ([place]) dx'd 11/2014  . Diabetes mellitus without complication ([place])    TYPE II  . Fatty liver   . Gallbladder disease   . GERD (gastroesophageal reflux disease)   . History of gout   . Hypertension   . Hypothyroidism   . Kidney disorder    spot on left kidney  . Osteoporosis   . PONV (postoperative nausea and vomiting)     Past Surgical History:  Procedure Laterality Date  . CHOLECYSTECTOMY    . IR RADIOLOGIST EVAL & MGMT  01/24/2017  . KNEE CARTILAGE SURGERY Bilateral   . NEPHRECTOMY Left 02/10/2015   Procedure: OPEN RETROPERINTONEAL EXPLORATION LEFT RENAL  CYST DECORTICATION X 5;  Surgeon: Cleon Gustin, MD;  Location: [facility];  Service: Urology;  Laterality: Left;  . PARTIAL COLECTOMY N/A 02/10/2015   Procedure: OPEN RIGHT  COLECTOMY ;  Surgeon: [name], MD;  Location: [facility];  Service: General;  Laterality: N/A;  . TUBAL LIGATION      Prior to Admission medications   Medication Sig Start Date End Date Taking?  Authorizing Provider  acetaminophen (TYLENOL) 500 MG tablet Take 500 mg by mouth every 6 (six) hours as needed for moderate  pain.   Yes [provider]  amLODipine (NORVASC) 2.5 MG tablet Take 1 tablet (2.5 mg total) by mouth daily. 12/04/19  Yes Cheryl Jeans, PA-C  atenolol (TENORMIN) 50 MG tablet TAKE 1 TABLET BY MOUTH IN  THE MORNING Patient taking differently: Take 50 mg by mouth daily.  02/03/20  Yes Cheryl Jeans, PA-C  ferrous sulfate 325 (65 FE) MG EC tablet Take 1 tablet (325 mg total) by mouth 2 (two) times daily with a meal. Patient taking differently: Take 325 mg by mouth daily.  02/02/20  Yes Cheryl Jeans, PA-C  furosemide (LASIX) 40 MG tablet Take 1 tablet (40 mg total) by mouth daily. 05/25/20  Yes [name], MD  glipiZIDE (GLUCOTROL XL) 5 MG 24 hr tablet TAKE (1) TABLET DAILY WITH BREAKFAST. Patient taking differently: Take 5 mg by mouth daily.  12/03/19  Yes Cheryl Jeans, PA-C  levothyroxine (SYNTHROID) 88 MCG tablet Take 1 tablet (88 mcg total) by mouth daily. 12/04/19  Yes Cheryl Jeans, PA-C  loperamide (IMODIUM) 2 MG capsule Take 2 capsules by mouth once. Then take additional 2mg  capsule as needed for continued diarrhea. No more than 16 mg in 24 hours. Patient taking differently: Take 2 mg by mouth as needed for diarrhea or loose stools. Take 2 capsules by mouth once. Then take additional 2mg  capsule as needed for continued diarrhea. No more than 16 mg in 24 hours. 04/27/20  Yes Cheryl Jeans, PA-C  LORazepam (ATIVAN) 0.5 MG tablet Take 1 tablet (0.5 mg total) by mouth 2 (two) times daily as needed for anxiety. 06/06/20  Yes Cheryl Jeans, PA-C  omeprazole (PRILOSEC) 20 MG capsule Take 1 capsule (20 mg total) by mouth daily. 12/04/19  Yes Cheryl Jeans, PA-C  ondansetron (ZOFRAN) 8 MG tablet Take 1 tablet (8 mg total) by mouth every 8 (eight) hours as needed for nausea or vomiting. 06/06/20  Yes Cheryl Jeans, PA-C  rivaroxaban  (XARELTO) 20 MG TABS tablet Take 1 tablet (20 mg total) by mouth daily with supper. 06/06/20  Yes Cheryl Jeans, PA-C  spironolactone (ALDACTONE) 50 MG tablet Take 1 tablet (50 mg total) by mouth daily. 05/25/20  Yes [name], MD  telmisartan (MICARDIS) 40 MG tablet Take 1 tablet (40 mg total) by mouth daily. Patient taking differently: Take 20 mg by mouth in the morning and at bedtime.  03/01/20  Yes Cheryl Jeans, PA-C  ACCU-CHEK AVIVA PLUS test strip  10/20/18   [provider]  Blood Glucose Monitoring Suppl (ACCU-CHEK AVIVA) device Use as instructed 07/03/19 07/02/20  Cheryl Jeans, PA-C    Current Facility-Administered Medications  Medication Dose Route Frequency Provider Last Rate Last Admin  . pantoprazole (PROTONIX) injection 40 mg  40 mg Intravenous Q12H Deno Etienne, DO   40 mg at 06/15/20 3382   Current Outpatient Medications  Medication Sig Dispense Refill  . acetaminophen (TYLENOL) 500 MG tablet Take 500 mg by mouth every 6 (six) hours as needed for moderate pain.    Marland Kitchen amLODipine (NORVASC) 2.5 MG tablet Take 1 tablet (2.5 mg total) by mouth daily. 90 tablet 2  . atenolol (TENORMIN) 50 MG tablet TAKE 1 TABLET BY MOUTH IN  THE MORNING (Patient taking differently: Take 50 mg by mouth daily. ) 90 tablet 2  . ferrous sulfate 325 (65 FE) MG EC tablet Take 1 tablet (325 mg total) by mouth 2 (two) times daily with a meal. (Patient taking differently: Take  325 mg by mouth daily. ) 90 tablet 1  . furosemide (LASIX) 40 MG tablet Take 1 tablet (40 mg total) by mouth daily. 30 tablet 6  . glipiZIDE (GLUCOTROL XL) 5 MG 24 hr tablet TAKE (1) TABLET DAILY WITH BREAKFAST. (Patient taking differently: Take 5 mg by mouth daily. ) 90 tablet 1  . levothyroxine (SYNTHROID) 88 MCG tablet Take 1 tablet (88 mcg total) by mouth daily. 90 tablet 2  . loperamide (IMODIUM) 2 MG capsule Take 2 capsules by mouth once. Then take additional 2mg  capsule as needed for continued diarrhea. No more  than 16 mg in 24 hours. (Patient taking differently: Take 2 mg by mouth as needed for diarrhea or loose stools. Take 2 capsules by mouth once. Then take additional 2mg  capsule as needed for continued diarrhea. No more than 16 mg in 24 hours.) 30 capsule 0  . LORazepam (ATIVAN) 0.5 MG tablet Take 1 tablet (0.5 mg total) by mouth 2 (two) times daily as needed for anxiety. 30 tablet 0  . omeprazole (PRILOSEC) 20 MG capsule Take 1 capsule (20 mg total) by mouth daily. 90 capsule 2  . ondansetron (ZOFRAN) 8 MG tablet Take 1 tablet (8 mg total) by mouth every 8 (eight) hours as needed for nausea or vomiting. 20 tablet 0  . rivaroxaban (XARELTO) 20 MG TABS tablet Take 1 tablet (20 mg total) by mouth daily with supper. 90 tablet 0  . spironolactone (ALDACTONE) 50 MG tablet Take 1 tablet (50 mg total) by mouth daily. 30 tablet 6  . telmisartan (MICARDIS) 40 MG tablet Take 1 tablet (40 mg total) by mouth daily. (Patient taking differently: Take 20 mg by mouth in the morning and at bedtime. ) 90 tablet 2  . ACCU-CHEK AVIVA PLUS test strip     . Blood Glucose Monitoring Suppl (ACCU-CHEK AVIVA) device Use as instructed 1 each 0    Allergies as of 06/15/2020 - Review Complete 06/15/2020  Allergen Reaction Noted  . Aspirin Other (See Comments) 10/16/2011  . Codeine Nausea And Vomiting 10/16/2011    Family History  Problem Relation Age of Onset  . Diabetes Mother   . Hypertension Other   . COPD Father   . Hypertension Sister   . GER disease Sister   . Heart attack Brother   . COPD Brother   . Lung cancer Brother   . COPD Brother   . Hypertension Brother     Social History   Socioeconomic History  . Marital status: Married    Spouse name: Not on file  . Number of children: 2  . Years of education: Not on file  . Highest education level: Not on file  Occupational History  . Occupation: Retired  Tobacco Use  . Smoking status: Never Smoker  . Smokeless tobacco: Never Used  Vaping Use  .  Vaping Use: Never used  Substance and Sexual Activity  . Alcohol use: No  . Drug use: No  . Sexual activity: Never  Other Topics Concern  . Not on file  Social History Narrative   03/03/15-Married, husband Jeneen Rinks   #2 grown sons and #2 grand daughters   Social Determinants of Health   Financial Resource Strain: Low Risk   . Difficulty of Paying Living Expenses: Not hard at all  Food Insecurity: No Food Insecurity  . Worried About Charity fundraiser in the Last Year: Never true  . Ran Out of Food in the Last Year: Never true  Transportation Needs: No  Transportation Needs  . Lack of Transportation (Medical): No  . Lack of Transportation (Non-Medical): No  Physical Activity: Inactive  . Days of Exercise per Week: 0 days  . Minutes of Exercise per Session: 0 min  Stress: No Stress Concern Present  . Feeling of Stress : Not at all  Social Connections: Socially Integrated  . Frequency of Communication with Friends and Family: More than three times a week  . Frequency of Social Gatherings with Friends and Family: More than three times a week  . Attends Religious Services: More than 4 times per year  . Active Member of Clubs or Organizations: Yes  . Attends Archivist Meetings: More than 4 times per year  . Marital Status: Married  Human resources officer Violence: Not At Risk  . Fear of Current or Ex-Partner: No  . Emotionally Abused: No  . Physically Abused: No  . Sexually Abused: No    Review of Systems: All systems reviewed and negative except where noted in HPI.    OBJECTIVE:    Physical Exam: Vital signs in last 24 hours: Temp:  [97.5 F (36.4 C)-97.7 F (36.5 C)] 97.5 F (36.4 C) (12/01 1207) Pulse Rate:  [65-71] 70 (12/01 1207) Resp:  [16-22] 18 (12/01 1207) BP: (125-150)/(55-66) 134/62 (12/01 1207) SpO2:  [93 %-99 %] 97 % (12/01 1149) Weight:  [97.5 kg] 97.5 kg (12/01 0906)   General:   Alert female in NAD Psych:  Pleasant, cooperative. Normal mood and  affect. Eyes:  Pupils equal, sclera clear, no icterus.   Conjunctiva pink. Ears:  Normal auditory acuity. Nose:  No deformity, discharge,  or lesions. Neck:  Supple; no masses Lungs:  Clear throughout to auscultation.   No wheezes, crackles, or rhonchi.  Heart:  Regular rate and rhythm; no lower extremity edema Abdomen:  Soft, non-distended, nontender, BS active, no palp mass   Rectal:  Deferred  Msk:  Symmetrical without gross deformities. . Neurologic:  Alert and  oriented x4;  grossly normal neurologically. Skin:  Intact without significant lesions or rashes.  Filed Weights   06/15/20 0906  Weight: 97.5 kg     Scheduled inpatient medications . pantoprazole (PROTONIX) IV  40 mg Intravenous Q12H      Intake/Output from previous day: No intake/output data recorded. Intake/Output this shift: Total I/O In: 415 [Blood:315; IV Piggyback:100] Out: -    Lab Results: Recent Labs    06/14/20 1320 06/15/20 0923  WBC 4.5 4.8  HGB 8.6 Repeated and verified X2.* 8.4*  HCT 27.4* 28.6*  PLT 119.0* 127*   BMET Recent Labs    06/15/20 0923  NA 142  K 4.7  CL 110  CO2 23  GLUCOSE 169*  BUN 21  CREATININE 1.31*  CALCIUM 8.8*   LFT Recent Labs    06/15/20 0923  PROT 6.2*  ALBUMIN 3.2*  AST 34  ALT 15  ALKPHOS 72  BILITOT 1.0   PT/INR Recent Labs    06/15/20 0923  LABPROT 15.4*  INR 1.3*   Hepatitis Panel No results for input(s): HEPBSAG, HCVAB, HEPAIGM, HEPBIGM in the last 72 hours.   . CBC Latest Ref Rng & Units 06/15/2020 06/14/2020 06/06/2020  WBC 4.0 - 10.5 K/uL 4.8 4.5 4.3  Hemoglobin 12.0 - 15.0 g/dL 8.4(L) 8.6 Repeated and verified X2.(L) 9.3(L)  Hematocrit 36 - 46 % 28.6(L) 27.4(L) 28.6(L)  Platelets 150 - 400 K/uL 127(L) 119.0(L) 121.0(L)    . CMP Latest Ref Rng & Units 06/15/2020 06/06/2020 05/25/2020  Glucose  70 - 99 mg/dL 169(H) 184(H) 92  BUN 8 - 23 mg/dL 21 19 14   Creatinine 0.44 - 1.00 mg/dL 1.31(H) 1.60(H) 1.19  Sodium 135 - 145  mmol/L 142 139 141  Potassium 3.5 - 5.1 mmol/L 4.7 4.5 4.4  Chloride 98 - 111 mmol/L 110 104 106  CO2 22 - 32 mmol/L 23 28 28   Calcium 8.9 - 10.3 mg/dL 8.8(L) 8.5 8.9  Total Protein 6.5 - 8.1 g/dL 6.2(L) - 6.2  Total Bilirubin 0.3 - 1.2 mg/dL 1.0 - 0.9  Alkaline Phos 38 - 126 U/L 72 - 110  AST 15 - 41 U/L 34 - 27  ALT 0 - 44 U/L 15 - 15   Studies/Results: No results found.  Active Problems:   GIB (gastrointestinal bleeding)    Tye Savoy, NP-C @  06/15/2020, 12:35 PM

## 2020-06-15 NOTE — ED Triage Notes (Signed)
Pt reports her hemoglobin has been dropping for the past month.  Yesterday pt saw her MD and got blood work drawn.  Pt was told that it was lower and was advised to come to the hospital for possible transfusion. She thinks they told her her hemoglobin was 8.6. Pt reports feeling weak with intermittent nausea and abd pain. Pt a/o x 4 and ambulates with a cane.

## 2020-06-15 NOTE — ED Notes (Addendum)
Blood transfusion consent form signed and at bedside

## 2020-06-15 NOTE — H&P (Addendum)
History and Physical    Cheryl Jordan ERD:[id number] DOB: [birth year] DOA: 06/15/2020  PCP: Brunetta Jeans, PA-C  Patient coming from: Home  Chief Complaint: Fatigue  HPI: Cheryl Jordan is a 79 y.o. female with medical history significant of colon CA, RLE DVT, cirrohosis. Presenting with fatigue and dark stools. She reports that she was in the hospital about 6 weeks ago. During that time, it was discovered that she had a RLE DVT and she was started on xarelto. A week after leaving the hospital, she noted intermittent BRBPR. It was not associated with any pain. She noted as the days progressed, it has changed to having black stools daily. During this time she has felt weak and unable to do her normal activities. She spoke with her PCP, who recommended coming to the ED to get a transfusion. So she did.     ED Course: Her Hgb was noted to be about 3pts off her baseline. EDP spoke with [facility] who recommended admission. She was ordered a unit of pRBCs. TRH was called for admission.   Review of Systems:  She denies CP, palpitations, dyspnea, syncopal episodes. Review of systems is otherwise negative for all not mentioned in HPI.   PMHx Past Medical History:  Diagnosis Date  . Allergy   . Anemia    hx of  . Anxiety   . Arthritis   . Colon cancer ([place]) dx'd 11/2014  . Diabetes mellitus without complication ([place])    TYPE II  . Fatty liver   . Gallbladder disease   . GERD (gastroesophageal reflux disease)   . History of gout   . Hypertension   . Hypothyroidism   . Kidney disorder    spot on left kidney  . Osteoporosis   . PONV (postoperative nausea and vomiting)     PSHx Past Surgical History:  Procedure Laterality Date  . CHOLECYSTECTOMY    . IR RADIOLOGIST EVAL & MGMT  01/24/2017  . KNEE CARTILAGE SURGERY Bilateral   . NEPHRECTOMY Left 02/10/2015   Procedure: OPEN RETROPERINTONEAL EXPLORATION LEFT RENAL  CYST DECORTICATION X 5;  Surgeon: Cleon Gustin, MD;  Location: [facility];   Service: Urology;  Laterality: Left;  . PARTIAL COLECTOMY N/A 02/10/2015   Procedure: OPEN RIGHT  COLECTOMY ;  Surgeon: [name], MD;  Location: [facility];  Service: General;  Laterality: N/A;  . TUBAL LIGATION      SocHx  reports that she has never smoked. She has never used smokeless tobacco. She reports that she does not drink alcohol and does not use drugs.  Allergies  Allergen Reactions  . Aspirin Other (See Comments)    Runny nose   . Codeine Nausea And Vomiting    FamHx Family History  Problem Relation Age of Onset  . Diabetes Mother   . Hypertension Other   . COPD Father   . Hypertension Sister   . GER disease Sister   . Heart attack Brother   . COPD Brother   . Lung cancer Brother   . COPD Brother   . Hypertension Brother     Prior to Admission medications   Medication Sig Start Date End Date Taking? Authorizing Provider  ACCU-CHEK AVIVA PLUS test strip  10/20/18   [provider]  acetaminophen (TYLENOL) 500 MG tablet Take 500 mg by mouth every 6 (six) hours as needed for moderate pain.    [provider]  amLODipine (NORVASC) 2.5 MG tablet Take 1 tablet (2.5 mg total)  by mouth daily. 12/04/19   Brunetta Jeans, PA-C  atenolol (TENORMIN) 50 MG tablet TAKE 1 TABLET BY MOUTH IN  THE MORNING Patient taking differently: Take 50 mg by mouth daily.  02/03/20   Brunetta Jeans, PA-C  Blood Glucose Monitoring Suppl (ACCU-CHEK AVIVA) device Use as instructed 07/03/19 07/02/20  Brunetta Jeans, PA-C  ferrous sulfate 325 (65 FE) MG EC tablet Take 1 tablet (325 mg total) by mouth 2 (two) times daily with a meal. Patient taking differently: Take 325 mg by mouth daily.  02/02/20   Brunetta Jeans, PA-C  furosemide (LASIX) 40 MG tablet Take 1 tablet (40 mg total) by mouth daily. 05/25/20   [name], MD  glipiZIDE (GLUCOTROL XL) 5 MG 24 hr tablet TAKE (1) TABLET DAILY WITH BREAKFAST. Patient taking differently: Take 5 mg by mouth daily.  12/03/19   Brunetta Jeans, PA-C  levothyroxine (SYNTHROID) 88 MCG tablet Take 1 tablet (88 mcg total) by mouth daily. 12/04/19   Brunetta Jeans, PA-C  loperamide (IMODIUM) 2 MG capsule Take 2 capsules by mouth once. Then take additional 2mg  capsule as needed for continued diarrhea. No more than 16 mg in 24 hours. Patient taking differently: Take 2 mg by mouth as needed for diarrhea or loose stools. Take 2 capsules by mouth once. Then take additional 2mg  capsule as needed for continued diarrhea. No more than 16 mg in 24 hours. 04/27/20   Brunetta Jeans, PA-C  LORazepam (ATIVAN) 0.5 MG tablet Take 1 tablet (0.5 mg total) by mouth 2 (two) times daily as needed for anxiety. 06/06/20   Brunetta Jeans, PA-C  omeprazole (PRILOSEC) 20 MG capsule Take 1 capsule (20 mg total) by mouth daily. 12/04/19   Brunetta Jeans, PA-C  ondansetron (ZOFRAN) 8 MG tablet Take 1 tablet (8 mg total) by mouth every 8 (eight) hours as needed for nausea or vomiting. 06/06/20   Brunetta Jeans, PA-C  rivaroxaban (XARELTO) 20 MG TABS tablet Take 1 tablet (20 mg total) by mouth daily with supper. 06/06/20   Brunetta Jeans, PA-C  spironolactone (ALDACTONE) 50 MG tablet Take 1 tablet (50 mg total) by mouth daily. 05/25/20   [name], MD  telmisartan (MICARDIS) 40 MG tablet Take 1 tablet (40 mg total) by mouth daily. Patient taking differently: Take 20 mg by mouth in the morning and at bedtime.  03/01/20   Brunetta Jeans, PA-C    Physical Exam: Vitals:   06/15/20 0855 06/15/20 0906 06/15/20 0930 06/15/20 1015  BP: (!) 142/61  (!) 141/61 (!) 146/61  Pulse: 69  67 65  Resp: 16  17 16   Temp: 97.7 F (36.5 C)     TempSrc: Oral     SpO2: 93%  98% 99%  Weight:  97.5 kg    Height:  5\' 7"  (1.702 m)      General: 79 y.o. female resting in bed in NAD Eyes: PERRL, normal sclera ENMT: Nares patent w/o discharge, orophaynx clear, dentition normal, ears w/o discharge/lesions/ulcers Neck: Supple, trachea midline Cardiovascular:  RRR, +S1, S2, no m/g/r, equal pulses throughout Respiratory: CTABL, no w/r/r, normal WOB GI: BS+, NDNT, no masses noted, no organomegaly noted MSK: No e/c/c Skin: No rashes, bruises, ulcerations noted Neuro: A&O x 3, no focal deficits Psyc: Appropriate interaction and affect, calm/cooperative  Labs on Admission: I have personally reviewed following labs and imaging studies  CBC: Recent Labs  Lab 06/14/20 1320 06/15/20 0923  WBC 4.5 4.8  NEUTROABS 2.3 3.0  HGB 8.6 Repeated and verified X2.* 8.4*  HCT 27.4* 28.6*  MCV 92.4 98.6  PLT 119.0* 884*   Basic Metabolic Panel: Recent Labs  Lab 06/15/20 0923  NA 142  K 4.7  CL 110  CO2 23  GLUCOSE 169*  BUN 21  CREATININE 1.31*  CALCIUM 8.8*   GFR: Estimated Creatinine Clearance: 42.5 mL/min (A) (by C-G formula based on SCr of 1.31 mg/dL (H)). Liver Function Tests: Recent Labs  Lab 06/15/20 0923  AST 34  ALT 15  ALKPHOS 72  BILITOT 1.0  PROT 6.2*  ALBUMIN 3.2*   No results for input(s): LIPASE, AMYLASE in the last 168 hours. No results for input(s): AMMONIA in the last 168 hours. Coagulation Profile: Recent Labs  Lab 06/15/20 0923  INR 1.3*   Cardiac Enzymes: No results for input(s): CKTOTAL, CKMB, CKMBINDEX, TROPONINI in the last 168 hours. BNP (last 3 results) Recent Labs    03/30/20 1507  PROBNP 250   HbA1C: No results for input(s): HGBA1C in the last 72 hours. CBG: No results for input(s): GLUCAP in the last 168 hours. Lipid Profile: No results for input(s): CHOL, HDL, LDLCALC, TRIG, CHOLHDL, LDLDIRECT in the last 72 hours. Thyroid Function Tests: No results for input(s): TSH, T4TOTAL, FREET4, T3FREE, THYROIDAB in the last 72 hours. Anemia Panel: No results for input(s): VITAMINB12, FOLATE, FERRITIN, TIBC, IRON, RETICCTPCT in the last 72 hours. Urine analysis:    Component Value Date/Time   COLORURINE YELLOW 04/29/2020 2000   APPEARANCEUR CLEAR 04/29/2020 2000   LABSPEC 1.011 04/29/2020 2000    PHURINE 6.0 04/29/2020 2000   GLUCOSEU NEGATIVE 04/29/2020 2000   HGBUR NEGATIVE 04/29/2020 2000   BILIRUBINUR NEGATIVE 04/29/2020 2000   BILIRUBINUR Negative 12/04/2019 [place] 04/29/2020 2000   PROTEINUR NEGATIVE 04/29/2020 2000   UROBILINOGEN 0.2 12/04/2019 1352   UROBILINOGEN 0.2 02/07/2011 1832   NITRITE NEGATIVE 04/29/2020 2000   LEUKOCYTESUR 117 (A) 04/29/2020 2000    Radiological Exams on Admission: No results found.  EKG: Independently reviewed. 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     - hold xarelto; let's see results of GI intervention     - can't places SCDs d/t RLE DVT  Thrombocytopenia     - secondary to liver dysfxn; follow  Hx of cirrhosis     - continue outpt follow up     - hold lasix/spironolactone for now per GI rec     - started on rocephin for SBP PPx; continue for now per GI rec  CKD3a     - baseline Scr is ~ 1.1; she's 1.3 at admission     - hold lasix/spironolactone for now per GI rec  DM2     - SSI, CLD, glucose check     - last A1c 6.2 on 04/29/20  HTN     - continue telmisartan, atenolol, amlodipine  Hypothyroidism     - continue synthroid  DVT prophylaxis: SCDs  Code Status: DNR  Family Communication: None at bedside.  Consults called: EDP spoke with [facility]  Status is: Observation  The patient remains OBS appropriate  and will d/c before 2 midnights.  Dispo: The patient is from: Home              Anticipated d/c is to: Home              Anticipated d/c date is: 1 day              Patient currently is not medically stable to d/c.  [name] DO [facility]  If 7PM-7AM, please contact night-coverage [email or web address]  06/15/2020, 10:48 AM

## 2020-06-15 NOTE — ED Notes (Signed)
Report called to [facility]

## 2020-06-16 ENCOUNTER — Encounter (HOSPITAL_COMMUNITY): Payer: Self-pay | Admitting: Internal Medicine

## 2020-06-16 DIAGNOSIS — Z79890 Hormone replacement therapy (postmenopausal): Secondary | ICD-10-CM | POA: Diagnosis not present

## 2020-06-16 DIAGNOSIS — Z7901 Long term (current) use of anticoagulants: Secondary | ICD-10-CM | POA: Diagnosis not present

## 2020-06-16 DIAGNOSIS — I8510 Secondary esophageal varices without bleeding: Secondary | ICD-10-CM | POA: Diagnosis not present

## 2020-06-16 DIAGNOSIS — Z66 Do not resuscitate: Secondary | ICD-10-CM | POA: Diagnosis present

## 2020-06-16 DIAGNOSIS — T184XXA Foreign body in colon, initial encounter: Secondary | ICD-10-CM | POA: Diagnosis not present

## 2020-06-16 DIAGNOSIS — D649 Anemia, unspecified: Secondary | ICD-10-CM | POA: Diagnosis not present

## 2020-06-16 DIAGNOSIS — D696 Thrombocytopenia, unspecified: Secondary | ICD-10-CM | POA: Diagnosis present

## 2020-06-16 DIAGNOSIS — Z85038 Personal history of other malignant neoplasm of large intestine: Secondary | ICD-10-CM | POA: Diagnosis not present

## 2020-06-16 DIAGNOSIS — R195 Other fecal abnormalities: Secondary | ICD-10-CM | POA: Diagnosis not present

## 2020-06-16 DIAGNOSIS — E039 Hypothyroidism, unspecified: Secondary | ICD-10-CM | POA: Diagnosis present

## 2020-06-16 DIAGNOSIS — K922 Gastrointestinal hemorrhage, unspecified: Secondary | ICD-10-CM | POA: Diagnosis present

## 2020-06-16 DIAGNOSIS — E1122 Type 2 diabetes mellitus with diabetic chronic kidney disease: Secondary | ICD-10-CM | POA: Diagnosis present

## 2020-06-16 DIAGNOSIS — K31819 Angiodysplasia of stomach and duodenum without bleeding: Secondary | ICD-10-CM | POA: Diagnosis not present

## 2020-06-16 DIAGNOSIS — K7460 Unspecified cirrhosis of liver: Secondary | ICD-10-CM | POA: Diagnosis present

## 2020-06-16 DIAGNOSIS — I129 Hypertensive chronic kidney disease with stage 1 through stage 4 chronic kidney disease, or unspecified chronic kidney disease: Secondary | ICD-10-CM | POA: Diagnosis present

## 2020-06-16 DIAGNOSIS — E872 Acidosis: Secondary | ICD-10-CM | POA: Diagnosis present

## 2020-06-16 DIAGNOSIS — K621 Rectal polyp: Secondary | ICD-10-CM | POA: Diagnosis not present

## 2020-06-16 DIAGNOSIS — Z7984 Long term (current) use of oral hypoglycemic drugs: Secondary | ICD-10-CM | POA: Diagnosis not present

## 2020-06-16 DIAGNOSIS — Z79899 Other long term (current) drug therapy: Secondary | ICD-10-CM | POA: Diagnosis not present

## 2020-06-16 DIAGNOSIS — N179 Acute kidney failure, unspecified: Secondary | ICD-10-CM | POA: Diagnosis present

## 2020-06-16 DIAGNOSIS — M810 Age-related osteoporosis without current pathological fracture: Secondary | ICD-10-CM | POA: Diagnosis present

## 2020-06-16 DIAGNOSIS — Z20822 Contact with and (suspected) exposure to covid-19: Secondary | ICD-10-CM | POA: Diagnosis present

## 2020-06-16 DIAGNOSIS — N1831 Chronic kidney disease, stage 3a: Secondary | ICD-10-CM | POA: Diagnosis present

## 2020-06-16 DIAGNOSIS — K635 Polyp of colon: Secondary | ICD-10-CM | POA: Diagnosis not present

## 2020-06-16 DIAGNOSIS — F419 Anxiety disorder, unspecified: Secondary | ICD-10-CM | POA: Diagnosis present

## 2020-06-16 DIAGNOSIS — K766 Portal hypertension: Secondary | ICD-10-CM | POA: Diagnosis present

## 2020-06-16 DIAGNOSIS — E669 Obesity, unspecified: Secondary | ICD-10-CM | POA: Diagnosis present

## 2020-06-16 DIAGNOSIS — Z905 Acquired absence of kidney: Secondary | ICD-10-CM | POA: Diagnosis not present

## 2020-06-16 DIAGNOSIS — Z9221 Personal history of antineoplastic chemotherapy: Secondary | ICD-10-CM | POA: Diagnosis not present

## 2020-06-16 DIAGNOSIS — K219 Gastro-esophageal reflux disease without esophagitis: Secondary | ICD-10-CM | POA: Diagnosis present

## 2020-06-16 DIAGNOSIS — Z86718 Personal history of other venous thrombosis and embolism: Secondary | ICD-10-CM | POA: Diagnosis not present

## 2020-06-16 DIAGNOSIS — K31811 Angiodysplasia of stomach and duodenum with bleeding: Secondary | ICD-10-CM | POA: Diagnosis present

## 2020-06-16 LAB — COMPREHENSIVE METABOLIC PANEL
ALT: 13 U/L (ref 0–44)
AST: 23 U/L (ref 15–41)
Albumin: 3 g/dL — ABNORMAL LOW (ref 3.5–5.0)
Alkaline Phosphatase: 68 U/L (ref 38–126)
Anion gap: 8 (ref 5–15)
BUN: 16 mg/dL (ref 8–23)
CO2: 21 mmol/L — ABNORMAL LOW (ref 22–32)
Calcium: 8.7 mg/dL — ABNORMAL LOW (ref 8.9–10.3)
Chloride: 111 mmol/L (ref 98–111)
Creatinine, Ser: 1.15 mg/dL — ABNORMAL HIGH (ref 0.44–1.00)
GFR, Estimated: 49 mL/min — ABNORMAL LOW (ref >60)
Glucose, Bld: 100 mg/dL — ABNORMAL HIGH (ref 70–99)
Potassium: 4.1 mmol/L (ref 3.5–5.1)
Sodium: 140 mmol/L (ref 135–145)
Total Bilirubin: 1.2 mg/dL (ref 0.3–1.2)
Total Protein: 5.4 g/dL — ABNORMAL LOW (ref 6.5–8.1)

## 2020-06-16 LAB — GLUCOSE, CAPILLARY
Glucose-Capillary: 118 mg/dL — ABNORMAL HIGH (ref 70–99)
Glucose-Capillary: 131 mg/dL — ABNORMAL HIGH (ref 70–99)
Glucose-Capillary: 123 mg/dL — ABNORMAL HIGH (ref 70–99)
Glucose-Capillary: 125 mg/dL — ABNORMAL HIGH (ref 70–99)

## 2020-06-16 LAB — HEMOGLOBIN AND HEMATOCRIT, BLOOD
HCT: 29.7 % — ABNORMAL LOW (ref 36.0–46.0)
HCT: 31.8 % — ABNORMAL LOW (ref 36.0–46.0)
HCT: 33.3 % — ABNORMAL LOW (ref 36.0–46.0)
HCT: 29.4 % — ABNORMAL LOW (ref 36.0–46.0)
Hemoglobin: 9 g/dL — ABNORMAL LOW (ref 12.0–15.0)
Hemoglobin: 9.5 g/dL — ABNORMAL LOW (ref 12.0–15.0)
Hemoglobin: 9.8 g/dL — ABNORMAL LOW (ref 12.0–15.0)
Hemoglobin: 9.2 g/dL — ABNORMAL LOW (ref 12.0–15.0)

## 2020-06-16 LAB — TYPE AND SCREEN
ABO/RH(D): A POS
Antibody Screen: NEGATIVE
Unit division: 0

## 2020-06-16 LAB — BPAM RBC
Blood Product Expiration Date: 202112192359
ISSUE DATE / TIME: 202112011131
Unit Type and Rh: 6200

## 2020-06-16 LAB — AFP TUMOR MARKER: AFP, Serum, Tumor Marker: 3.6 ng/mL (ref 0.0–8.3)

## 2020-06-16 MED ORDER — METOCLOPRAMIDE HCL 5 MG/ML IJ SOLN
10.0000 mg | Freq: Once | INTRAMUSCULAR | Status: AC
  Administered 2020-06-17: 10 mg via INTRAVENOUS
  Filled 2020-06-16: qty 2

## 2020-06-16 MED ORDER — SIMETHICONE 80 MG PO CHEW
80.0000 mg | CHEWABLE_TABLET | Freq: Once | ORAL | Status: AC
  Administered 2020-06-17: 80 mg via ORAL
  Filled 2020-06-16: qty 1

## 2020-06-16 MED ORDER — METOCLOPRAMIDE HCL 5 MG/ML IJ SOLN
10.0000 mg | Freq: Once | INTRAMUSCULAR | Status: AC
  Administered 2020-06-16: 10 mg via INTRAVENOUS
  Filled 2020-06-16: qty 2

## 2020-06-16 MED ORDER — SODIUM CHLORIDE 0.9 % IV SOLN
INTRAVENOUS | Status: DC | PRN
  Administered 2020-06-16: 250 mL via INTRAVENOUS

## 2020-06-16 NOTE — Progress Notes (Signed)
PROGRESS NOTE    Cheryl Jordan  [name] DOB: 09-02-1940 DOA: 06/15/2020 PCP: Brunetta Jeans, PA-C   Brief Narrative:  HPI per Dr. Cherylann Ratel on 06/15/20 Cheryl Jordan is a 79 y.o. female with medical history significant of colon CA, RLE DVT, cirrohosis. Presenting with fatigue and dark stools. She reports that she was in the hospital about 6 weeks ago. During that time, it was discovered that she had a RLE DVT and she was started on xarelto. A week after leaving the hospital, she noted intermittent BRBPR. It was not associated with any pain. She noted as the days progressed, it has changed to having black stools daily. During this time she has felt weak and unable to do her normal activities. She spoke with her PCP, who recommended coming to the ED to get a transfusion. So she did.     ED Course: Her Hgb was noted to be about 3pts off her baseline. EDP spoke with [facility] who recommended admission. She was ordered a unit of pRBCs. TRH was called for admission.   **Interim History  Gastroenterology was consulted and are planning on doing an EGD and colonoscopy in the morning.  She was transfused 1 unit PRBCs in the ED.  Hemoglobin is slightly improving.  Assessment & Plan:   Active Problems:   GIB (gastrointestinal bleeding)  Heme Positive Stools r/o GI Bleeding  Acute Symptomatic Anemia Superimposed on Chronic Normocytic Anemia -Admitted to Observation Telemetry  -Continue to hold Xarelto, check q6h H&H, add protonix -Transfuse as necessary especially for Hgb <7.0; she's received 1 unit pRBCs in the ED -Hgb/Hct went from 8.4/28.6 and last H/H Check was 9.5/31.8 -Gastroenterology evaluating and pursuing an EGD and colonoscopy tomorrow with Dr. Rudene Christians already -Because the patient was having some nausea gastroenterology has ordered IV Reglan 10 mg to be given twice with prep -Continue IV PPI every 12h -She has been on ferrous sulfate 5 daily with breakfast which has now been held given  that she has had some dark stools -C/w CLD for now -Will be undergoing an EGD and Colonoscopy in th AM to r/o Reccurent Colo Cancer, PUD, AVMs etc  Hx of DVT on Antiocagulation with Louanna Raw -Continue to Hold Xarelto while awaiting the EGD and Colonoscopy -Can't places SCDs d/t RLE DVT  Thrombocytopenia -Secondary to liver Dysfunction -Platelet Count went from 119 -> 127 -Continue to Monitor for S/Sx of Bleeding -Repeat CBC in the AM  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   Liver Lesion -Noted on Noncontrast CT Scan in October with concern for possible HCC -AFP ordered by GI and was 3.6 -Patient will eventually need Imaging and they are going to consider an MRI or a Contrasted Triple phase Protocol to evaluate further but defer timing of this to GI  [id number] Metabolic Acidosis -Baseline Scr is ~ 1.1; she's 1.31 at admission -BUN/Cr is now improved to 16/1.15 -Continue hold Lasix/Spironolactone for now per GI rec -She has a Mild Metabolic Acidosis   DM2 -C/w Sensitive Novolog  SSI AC and a Clear Liquid Diet -Last A1c 6.2 on 04/29/20 -Continue to Monitor CBG's closely; CBG's ranging from 101-131  HTN -Continue Amlodipine 2.5 mg po Daily, Atenolol 50 mg po Daily, Telmisartan subsitution with Irbesartan 75 mg po BID -Monitor BP per Protocol  -Last BP was on the softer side at 109/51  Hypothyroidism -Continue Levothyroxine 88 mcg po Daily  Obesity -Complicates overall  prognosis and care -Estimated body mass index is 33.05 kg/m as calculated from the following:   Height as of this encounter: 5' 7"  (1.702 m).   Weight as of this encounter: 95.7 kg. -Weight Loss and Dietary Counseling given   GOC: DNR, poA  DVT prophylaxis: SCDs Code Status: DO NOT RESUSCITATE  Family Communication: No family present at bedside  Disposition Plan: Pending further clinical improvement and clearance by Gastroenterology and will also obtain PT and OT for further evaluation  Status is: Observation  The patient will require care spanning > 2 midnights and should be moved to inpatient because: Unsafe d/c plan, IV treatments appropriate due to intensity of illness or inability to take PO and Inpatient level of care appropriate due to severity of illness  Dispo: The patient is from: Home              Anticipated d/c is to: TBD              Anticipated d/c date is: 1-2 days              Patient currently is not medically stable to d/c.  Consultants:   Gastroenterology   Procedures: EGD and Colonoscopy to be done in the AM   Antimicrobials:  Anti-infectives (From admission, onward)   Start     Dose/Rate Route Frequency Ordered Stop   06/16/20 0600  cefTRIAXone (ROCEPHIN) 2 g in sodium chloride 0.9 % 100 mL IVPB        2 g 200 mL/hr over 30 Minutes Intravenous Every 24 hours 06/15/20 1857     06/15/20 0945  cefTRIAXone (ROCEPHIN) 1 g in sodium chloride 0.9 % 100 mL IVPB        1 g 200 mL/hr over 30 Minutes Intravenous  Once 06/15/20 0932 06/15/20 1030        Subjective: Seen and examined at bedside and Continues to be weak and was nauseous this morning.  Has not had a very good appetite.  Denies any chest pain, lightheadedness or dizziness.  Undergoing colonoscopy and EGD tomorrow but has not had any further bleeding.  No other concerns or complaints at this time.  Objective: Vitals:   06/15/20 1553 06/15/20 1957 06/15/20 2353 06/16/20 0401  BP: (!) 139/57 (!)  155/66 138/64 (!) 143/67  Pulse: 64 70 65 65  Resp: 16 16 16 16   Temp: 97.6 F (36.4 C) 97.9 F (36.6 C) 98.2 F (36.8 C) 97.9 F (36.6 C)  TempSrc: Oral Oral Oral Oral  SpO2: 97% 95% 93% 91%  Weight: 95.7 kg     Height: 5' 7"  (1.702 m)       Intake/Output Summary (Last 24 hours) at 06/16/2020 0847 Last data filed at 06/16/2020 0712 Gross per 24 hour  Intake 519.76 ml  Output 900 ml  Net -380.24 ml   Filed Weights   06/15/20 0906 06/15/20 1553  Weight: 97.5 kg 95.7 kg   Examination: Physical Exam:  Constitutional: WN/WD obese Caucasian female currently no acute distress appears fatigued though but is calm Eyes: Lids and conjunctivae normal, sclerae anicteric  ENMT: External Ears, Nose appear normal. Grossly normal hearing.  Neck: Appears normal, supple, no cervical masses, normal ROM, no appreciable thyromegaly; no JVD Respiratory: Diminished to auscultation bilaterally, no wheezing, rales, rhonchi or crackles. Normal respiratory effort and patient is not tachypenic. No accessory muscle use.  Unlabored breathing Cardiovascular: RRR, no murmurs / rubs / gallops. S1 and S2 auscultated.  Minimal extremity edema Abdomen: Soft, non-tender, distended secondary body habitus. Bowel sounds positive.  GU: Deferred. Musculoskeletal: No clubbing / cyanosis of digits/nails. No joint deformity upper and lower extremities.  Skin: No rashes, lesions, ulcers on limited skin evaluation. No induration; Warm and dry.  Neurologic: CN 2-12 grossly intact with no focal deficits. Romberg sign and cerebellar reflexes not assessed.  Psychiatric: Normal judgment and insight. Alert and oriented x 3. Normal mood and appropriate affect.   Data Reviewed: I have personally reviewed following labs and imaging studies  CBC: Recent Labs  Lab 06/14/20 1320 06/15/20 0923 06/15/20 1558 06/15/20 2204 06/16/20 0419  WBC 4.5 4.8  --   --   --   NEUTROABS 2.3 3.0  --   --   --   HGB 8.6 Repeated and  verified X2.* 8.4* 9.4* 8.6* 9.0*  HCT 27.4* 28.6* 30.7* 27.9* 29.7*  MCV 92.4 98.6  --   --   --   PLT 119.0* 127*  --   --   --    Basic Metabolic Panel: Recent Labs  Lab 06/15/20 0923 06/16/20 0419  NA 142 140  K 4.7 4.1  CL 110 111  CO2 23 21*  GLUCOSE 169* 100*  BUN 21 16  CREATININE 1.31* 1.15*  CALCIUM 8.8* 8.7*   GFR: Estimated Creatinine Clearance: 47.9 mL/min (A) (by C-G formula based on SCr of 1.15 mg/dL (H)). Liver Function Tests: Recent Labs  Lab 06/15/20 0923 06/16/20 0419  AST 34 23  ALT 15 13  ALKPHOS 72 68  BILITOT 1.0 1.2  PROT 6.2* 5.4*  ALBUMIN 3.2* 3.0*   No results for input(s): LIPASE, AMYLASE in the last 168 hours. No results for input(s): AMMONIA in the last 168 hours. Coagulation Profile: Recent Labs  Lab 06/15/20 0923  INR 1.3*   Cardiac Enzymes: No results for input(s): CKTOTAL, CKMB, CKMBINDEX, TROPONINI in the last 168 hours. BNP (last 3 results) Recent Labs    03/30/20 1507  PROBNP 250   HbA1C: No results for input(s): HGBA1C in the last 72 hours. CBG: Recent Labs  Lab 06/15/20 1842 06/15/20 2159 06/16/20 0731  GLUCAP 111* 101* 118*   Lipid Profile: No results for input(s): CHOL, HDL, LDLCALC, TRIG, CHOLHDL, LDLDIRECT in the last 72 hours. Thyroid Function Tests: No results for input(s): TSH, T4TOTAL, FREET4, T3FREE, THYROIDAB in the last 72 hours. Anemia Panel: No results for input(s): VITAMINB12, FOLATE, FERRITIN, TIBC, IRON, RETICCTPCT in the last 72 hours. Sepsis Labs: No results for input(s): PROCALCITON, LATICACIDVEN in the last 168 hours.  Recent Results (from the past 240 hour(s))  Resp Panel by RT-PCR (Flu A&B, Covid) Nasopharyngeal Swab     Status: None   Collection Time: 06/15/20 10:30 AM   Specimen: Nasopharyngeal Swab; Nasopharyngeal(NP) swabs in vial transport medium  Result Value Ref Range Status   SARS Coronavirus 2 by RT PCR NEGATIVE NEGATIVE Final    Comment: (NOTE) SARS-CoV-2 target nucleic  acids are NOT DETECTED.  The SARS-CoV-2 RNA is generally detectable in upper respiratory specimens during the acute phase of infection. The lowest concentration of SARS-CoV-2 viral copies this assay can detect  is 138 copies/mL. A negative result does not preclude SARS-Cov-2 infection and should not be used as the sole basis for treatment or other patient management decisions. A negative result may occur with  improper specimen collection/handling, submission of specimen other than nasopharyngeal swab, presence of viral mutation(s) within the areas targeted by this assay, and inadequate number of viral copies(<138 copies/mL). A negative result must be combined with clinical observations, patient history, and epidemiological information. The expected result is Negative.  Fact Sheet for Patients:  EntrepreneurPulse.com.au  Fact Sheet for Healthcare Providers:  IncredibleEmployment.be  This test is no t yet approved or cleared by the [place] FDA and  has been authorized for detection and/or diagnosis of SARS-CoV-2 by FDA under an Emergency Use Authorization (EUA). This EUA will remain  in effect (meaning this test can be used) for the duration of the COVID-19 declaration under Section 564(b)(1) of the Act, 21 U.S.C.section 360bbb-3(b)(1), unless the authorization is terminated  or revoked sooner.       Influenza A by PCR NEGATIVE NEGATIVE Final   Influenza B by PCR NEGATIVE NEGATIVE Final    Comment: (NOTE) The Xpert Xpress SARS-CoV-2/FLU/RSV plus assay is intended as an aid in the diagnosis of influenza from Nasopharyngeal swab specimens and should not be used as a sole basis for treatment. Nasal washings and aspirates are unacceptable for Xpert Xpress SARS-CoV-2/FLU/RSV testing.  Fact Sheet for Patients: EntrepreneurPulse.com.au  Fact Sheet for Healthcare Providers: IncredibleEmployment.be  This  test is not yet approved or cleared by the [place] FDA and has been authorized for detection and/or diagnosis of SARS-CoV-2 by FDA under an Emergency Use Authorization (EUA). This EUA will remain in effect (meaning this test can be used) for the duration of the COVID-19 declaration under Section 564(b)(1) of the Act, 21 U.S.C. section 360bbb-3(b)(1), unless the authorization is terminated or revoked.  Performed at [facility], [place] [address], [place], [place] [address]      RN Pressure Injury Documentation:     Estimated body mass index is 33.05 kg/m as calculated from the following:   Height as of this encounter: 5' 7"  (1.702 m).   Weight as of this encounter: 95.7 kg.  Malnutrition Type:      Malnutrition Characteristics:      Nutrition Interventions:        Radiology Studies: No results found.   Scheduled Meds:  amLODipine  2.5 mg Oral Daily   atenolol  50 mg Oral Daily   ferrous sulfate  325 mg Oral Q breakfast   insulin aspart  0-9 Units Subcutaneous TID WC   irbesartan  75 mg Oral BID   levothyroxine  88 mcg Oral QAC breakfast   pantoprazole (PROTONIX) IV  40 mg Intravenous Q12H   peg 3350 powder  0.5 kit Oral BID   Continuous Infusions:  sodium chloride 10 mL/hr at 06/16/20 0643   cefTRIAXone (ROCEPHIN)  IV Stopped (06/16/20 0615)     LOS: 0 days   Kerney Elbe, DO [facility] PAGER is on [place]  If 7PM-7AM, please contact night-coverage [email or web address]

## 2020-06-16 NOTE — H&P (View-Only) (Signed)
Progress Note   Subjective  Chief Complaint: GI bleed, history of cirrhosis  Patient is doing well this morning, tells me that she is slightly nauseous this morning but thinks it may just be from not eating a whole lot of food.  She has been on clears so far today.  She is ready to prep for colonoscopy and EGD tomorrow.  Denies any further rectal bleeding.   Objective   Vital signs in last 24 hours: Temp:  [97.5 F (36.4 C)-98.2 F (36.8 C)] 97.9 F (36.6 C) (12/02 0401) Pulse Rate:  [64-74] 65 (12/02 0401) Resp:  [15-23] 16 (12/02 0401) BP: (134-160)/(57-75) 143/67 (12/02 0401) SpO2:  [91 %-99 %] 91 % (12/02 0401) Weight:  [95.7 kg] 95.7 kg (12/01 1553) Last BM Date: 06/16/20 General:    Elderly white female in NAD Heart:  Regular rate and rhythm; no murmurs Lungs: Respirations even and unlabored, lungs CTA bilaterally Abdomen:  Soft, nontender and nondistended. Normal bowel sounds. Extremities:  Without edema. Neurologic:  Alert and oriented,  grossly normal neurologically. Psych:  Cooperative. Normal mood and affect.  Intake/Output from previous day: 12/01 0701 - 12/02 0700 In: 519.8 [I.V.:4.8; Blood:315; IV Piggyback:200] Out: 900 [Urine:900]  Lab Results: Recent Labs    06/14/20 1320 06/14/20 1320 06/15/20 0923 06/15/20 1558 06/15/20 2204 06/16/20 0419 06/16/20 0951  WBC 4.5  --  4.8  --   --   --   --   HGB 8.6 Repeated and verified X2.*   < > 8.4*   < > 8.6* 9.0* 9.5*  HCT 27.4*   < > 28.6*   < > 27.9* 29.7* 31.8*  PLT 119.0*  --  127*  --   --   --   --    < > = values in this interval not displayed.   BMET Recent Labs    06/15/20 0923 06/16/20 0419  NA 142 140  K 4.7 4.1  CL 110 111  CO2 23 21*  GLUCOSE 169* 100*  BUN 21 16  CREATININE 1.31* 1.15*  CALCIUM 8.8* 8.7*   LFT Recent Labs    06/16/20 0419  PROT 5.4*  ALBUMIN 3.0*  AST 23  ALT 13  ALKPHOS 68  BILITOT 1.2   PT/INR Recent Labs    06/15/20 0923  LABPROT 15.4*  INR  1.3*    Assessment / Plan:   Assessment: 1.  Worsening of chronic normocytic anemia and heme positive stools: Some intermittent rectal bleeding on Xarelto, chronic black stools but is also on iron, hemoglobin continues to remain declined, 9.5 as of this morning; consider GI source of blood loss versus other 2.  Cirrhosis with portal hypertension, likely secondary to NASH 3.  AKI: after Lasix increase, creatinine improved to 1.15 today 4.  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  Chronic postprandial diarrhea  Plan: 1.  Patient is scheduled for an EGD and colonoscopy tomorrow with Dr. Rush Landmark.  Did discuss risks, benefits, limitations and alternatives and the patient has agreed to proceed. 2.  Continue IV PPI twice daily 3.  Continue Rocephin for SBP prophylaxis 4.  Continue clear liquid diet today and n.p.o. after midnight 5.  Movi prep has been ordered 6.  Ordered Reglan 10 mg to be given twice with prep today as patient complains  of some nausea 7.  Please await any further recommendations from Dr. Rush Landmark later today  Thank you for kind consultation, we will continue to follow.   LOS: 0 days   Levin Erp  06/16/2020, 11:43 AM

## 2020-06-16 NOTE — Care Management Obs Status (Signed)
[place] NOTIFICATION   Patient Details  Name: WAVER DIBIASIO MRN: [id number] Date of Birth: 1941-05-16   Medicare Observation Status Notification Given:  Yes    Lynnell Catalan, RN 06/16/2020, 1:36 PM

## 2020-06-16 NOTE — Progress Notes (Signed)
Progress Note   Subjective  Chief Complaint: GI bleed, history of cirrhosis  Patient is doing well this morning, tells me that she is slightly nauseous this morning but thinks it may just be from not eating a whole lot of food.  She has been on clears so far today.  She is ready to prep for colonoscopy and EGD tomorrow.  Denies any further rectal bleeding.   Objective   Vital signs in last 24 hours: Temp:  [97.5 F (36.4 C)-98.2 F (36.8 C)] 97.9 F (36.6 C) (12/02 0401) Pulse Rate:  [64-74] 65 (12/02 0401) Resp:  [15-23] 16 (12/02 0401) BP: (134-160)/(57-75) 143/67 (12/02 0401) SpO2:  [91 %-99 %] 91 % (12/02 0401) Weight:  [95.7 kg] 95.7 kg (12/01 1553) Last BM Date: 06/16/20 General:    Elderly white female in NAD Heart:  Regular rate and rhythm; no murmurs Lungs: Respirations even and unlabored, lungs CTA bilaterally Abdomen:  Soft, nontender and nondistended. Normal bowel sounds. Extremities:  Without edema. Neurologic:  Alert and oriented,  grossly normal neurologically. Psych:  Cooperative. Normal mood and affect.  Intake/Output from previous day: 12/01 0701 - 12/02 0700 In: 519.8 [I.V.:4.8; Blood:315; IV Piggyback:200] Out: 900 [Urine:900]  Lab Results: Recent Labs    06/14/20 1320 06/14/20 1320 06/15/20 0923 06/15/20 1558 06/15/20 2204 06/16/20 0419 06/16/20 0951  WBC 4.5  --  4.8  --   --   --   --   HGB 8.6 Repeated and verified X2.*   < > 8.4*   < > 8.6* 9.0* 9.5*  HCT 27.4*   < > 28.6*   < > 27.9* 29.7* 31.8*  PLT 119.0*  --  127*  --   --   --   --    < > = values in this interval not displayed.   BMET Recent Labs    06/15/20 0923 06/16/20 0419  NA 142 140  K 4.7 4.1  CL 110 111  CO2 23 21*  GLUCOSE 169* 100*  BUN 21 16  CREATININE 1.31* 1.15*  CALCIUM 8.8* 8.7*   LFT Recent Labs    06/16/20 0419  PROT 5.4*  ALBUMIN 3.0*  AST 23  ALT 13  ALKPHOS 68  BILITOT 1.2   PT/INR Recent Labs    06/15/20 0923  LABPROT 15.4*  INR  1.3*    Assessment / Plan:   Assessment: 1.  Worsening of chronic normocytic anemia and heme positive stools: Some intermittent rectal bleeding on Xarelto, chronic black stools but is also on iron, hemoglobin continues to remain declined, 9.5 as of this morning; consider GI source of blood loss versus other 2.  Cirrhosis with portal hypertension, likely secondary to NASH 3.  AKI: after Lasix increase, creatinine improved to 1.15 today 4.  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  Chronic postprandial diarrhea  Plan: 1.  Patient is scheduled for an EGD and colonoscopy tomorrow with Dr. Rush Landmark.  Did discuss risks, benefits, limitations and alternatives and the patient has agreed to proceed. 2.  Continue IV PPI twice daily 3.  Continue Rocephin for SBP prophylaxis 4.  Continue clear liquid diet today and n.p.o. after midnight 5.  Movi prep has been ordered 6.  Ordered Reglan 10 mg to be given twice with prep today as patient complains  of some nausea 7.  Please await any further recommendations from Dr. Rush Landmark later today  Thank you for kind consultation, we will continue to follow.   LOS: 0 days   Levin Erp  06/16/2020, 11:43 AM

## 2020-06-17 ENCOUNTER — Inpatient Hospital Stay (HOSPITAL_COMMUNITY): Payer: Medicare Other | Admitting: Anesthesiology

## 2020-06-17 ENCOUNTER — Encounter (HOSPITAL_COMMUNITY): Payer: Self-pay | Admitting: Internal Medicine

## 2020-06-17 ENCOUNTER — Encounter (HOSPITAL_COMMUNITY)
Admission: EM | Disposition: A | Discharge status: Home / Self Care | Payer: Self-pay | Source: Home / Self Care | Attending: Internal Medicine

## 2020-06-17 DIAGNOSIS — T184XXA Foreign body in colon, initial encounter: Secondary | ICD-10-CM

## 2020-06-17 DIAGNOSIS — K621 Rectal polyp: Secondary | ICD-10-CM

## 2020-06-17 DIAGNOSIS — K31811 Angiodysplasia of stomach and duodenum with bleeding: Principal | ICD-10-CM

## 2020-06-17 DIAGNOSIS — K766 Portal hypertension: Secondary | ICD-10-CM

## 2020-06-17 DIAGNOSIS — K31819 Angiodysplasia of stomach and duodenum without bleeding: Secondary | ICD-10-CM

## 2020-06-17 DIAGNOSIS — K635 Polyp of colon: Secondary | ICD-10-CM

## 2020-06-17 HISTORY — PX: GI RADIOFREQUENCY ABLATION: SHX6807

## 2020-06-17 HISTORY — PX: BIOPSY: SHX5522

## 2020-06-17 HISTORY — PX: ESOPHAGOGASTRODUODENOSCOPY (EGD) WITH PROPOFOL: SHX5813

## 2020-06-17 HISTORY — PX: COLONOSCOPY WITH PROPOFOL: SHX5780

## 2020-06-17 HISTORY — PX: FOREIGN BODY REMOVAL: SHX962

## 2020-06-17 HISTORY — PX: HEMOSTASIS CLIP PLACEMENT: SHX6857

## 2020-06-17 HISTORY — PX: POLYPECTOMY: SHX5525

## 2020-06-17 LAB — COMPREHENSIVE METABOLIC PANEL
ALT: 15 U/L (ref 0–44)
AST: 26 U/L (ref 15–41)
Albumin: 2.8 g/dL — ABNORMAL LOW (ref 3.5–5.0)
Alkaline Phosphatase: 67 U/L (ref 38–126)
Anion gap: 10 (ref 5–15)
BUN: 12 mg/dL (ref 8–23)
CO2: 19 mmol/L — ABNORMAL LOW (ref 22–32)
Calcium: 8.7 mg/dL — ABNORMAL LOW (ref 8.9–10.3)
Chloride: 110 mmol/L (ref 98–111)
Creatinine, Ser: 1.2 mg/dL — ABNORMAL HIGH (ref 0.44–1.00)
GFR, Estimated: 46 mL/min — ABNORMAL LOW (ref >60)
Glucose, Bld: 131 mg/dL — ABNORMAL HIGH (ref 70–99)
Potassium: 4.3 mmol/L (ref 3.5–5.1)
Sodium: 139 mmol/L (ref 135–145)
Total Bilirubin: 0.9 mg/dL (ref 0.3–1.2)
Total Protein: 5.3 g/dL — ABNORMAL LOW (ref 6.5–8.1)

## 2020-06-17 LAB — HEMOGLOBIN AND HEMATOCRIT, BLOOD
HCT: 28.4 % — ABNORMAL LOW (ref 36.0–46.0)
HCT: 29.9 % — ABNORMAL LOW (ref 36.0–46.0)
HCT: 31.4 % — ABNORMAL LOW (ref 36.0–46.0)
HCT: 32.5 % — ABNORMAL LOW (ref 36.0–46.0)
Hemoglobin: 8.8 g/dL — ABNORMAL LOW (ref 12.0–15.0)
Hemoglobin: 9 g/dL — ABNORMAL LOW (ref 12.0–15.0)
Hemoglobin: 9.5 g/dL — ABNORMAL LOW (ref 12.0–15.0)
Hemoglobin: 9.9 g/dL — ABNORMAL LOW (ref 12.0–15.0)

## 2020-06-17 LAB — CBC WITH DIFFERENTIAL/PLATELET
Abs Immature Granulocytes: 0.01 10*3/uL (ref 0.00–0.07)
Basophils Absolute: 0 10*3/uL (ref 0.0–0.1)
Basophils Relative: 1 %
Eosinophils Absolute: 0 10*3/uL (ref 0.0–0.5)
Eosinophils Relative: 1 %
HCT: 29.4 % — ABNORMAL LOW (ref 36.0–46.0)
Hemoglobin: 8.8 g/dL — ABNORMAL LOW (ref 12.0–15.0)
Immature Granulocytes: 0 %
Lymphocytes Relative: 36 %
Lymphs Abs: 1.5 10*3/uL (ref 0.7–4.0)
MCH: 29.3 pg (ref 26.0–34.0)
MCHC: 29.9 g/dL — ABNORMAL LOW (ref 30.0–36.0)
MCV: 98 fL (ref 80.0–100.0)
Monocytes Absolute: 0.2 10*3/uL (ref 0.1–1.0)
Monocytes Relative: 6 %
Neutro Abs: 2.3 10*3/uL (ref 1.7–7.7)
Neutrophils Relative %: 56 %
Platelets: 106 10*3/uL — ABNORMAL LOW (ref 150–400)
RBC: 3 MIL/uL — ABNORMAL LOW (ref 3.87–5.11)
RDW: 17 % — ABNORMAL HIGH (ref 11.5–15.5)
WBC: 4 10*3/uL (ref 4.0–10.5)
nRBC: 0 % (ref 0.0–0.2)

## 2020-06-17 LAB — GLUCOSE, CAPILLARY
Glucose-Capillary: 129 mg/dL — ABNORMAL HIGH (ref 70–99)
Glucose-Capillary: 105 mg/dL — ABNORMAL HIGH (ref 70–99)
Glucose-Capillary: 129 mg/dL — ABNORMAL HIGH (ref 70–99)

## 2020-06-17 LAB — PHOSPHORUS: Phosphorus: 3.6 mg/dL (ref 2.5–4.6)

## 2020-06-17 LAB — MAGNESIUM: Magnesium: 1.8 mg/dL (ref 1.7–2.4)

## 2020-06-17 SURGERY — COLONOSCOPY WITH PROPOFOL
Anesthesia: Monitor Anesthesia Care

## 2020-06-17 MED ORDER — GLYCOPYRROLATE 0.2 MG/ML IJ SOLN
INTRAMUSCULAR | Status: DC | PRN
  Administered 2020-06-17 (×2): .1 mg via INTRAVENOUS

## 2020-06-17 MED ORDER — PROPOFOL 10 MG/ML IV BOLUS
INTRAVENOUS | Status: AC
  Filled 2020-06-17: qty 20

## 2020-06-17 MED ORDER — SUCRALFATE 1 GM/10ML PO SUSP
1.0000 g | Freq: Three times a day (TID) | ORAL | Status: DC
  Administered 2020-06-17 (×2): 1 g via ORAL
  Filled 2020-06-17 (×2): qty 10

## 2020-06-17 MED ORDER — PANTOPRAZOLE SODIUM 40 MG PO TBEC
40.0000 mg | DELAYED_RELEASE_TABLET | Freq: Two times a day (BID) | ORAL | Status: DC
  Administered 2020-06-17 – 2020-06-18 (×2): 40 mg via ORAL
  Filled 2020-06-17 (×2): qty 1

## 2020-06-17 MED ORDER — PANTOPRAZOLE SODIUM 40 MG PO TBEC: 40.0000 mg | DELAYED_RELEASE_TABLET | Freq: Two times a day (BID) | ORAL | Status: DC

## 2020-06-17 MED ORDER — PROPOFOL 500 MG/50ML IV EMUL
INTRAVENOUS | Status: DC | PRN
  Administered 2020-06-17: 100 ug/kg/min via INTRAVENOUS
  Administered 2020-06-17: 50 mg via INTRAVENOUS

## 2020-06-17 MED ORDER — LIDOCAINE HCL (CARDIAC) PF 100 MG/5ML IV SOSY
PREFILLED_SYRINGE | INTRAVENOUS | Status: DC | PRN
  Administered 2020-06-17: 100 mg via INTRAVENOUS

## 2020-06-17 MED ORDER — EPHEDRINE SULFATE 50 MG/ML IJ SOLN
INTRAMUSCULAR | Status: DC | PRN
  Administered 2020-06-17: 7.5 mg via INTRAVENOUS
  Administered 2020-06-17: 5 mg via INTRAVENOUS
  Administered 2020-06-17: 7.5 mg via INTRAVENOUS

## 2020-06-17 MED ORDER — PROPRANOLOL HCL 20 MG PO TABS
40.0000 mg | ORAL_TABLET | Freq: Two times a day (BID) | ORAL | Status: DC
  Administered 2020-06-18: 40 mg via ORAL
  Filled 2020-06-17: qty 2

## 2020-06-17 MED ORDER — LACTATED RINGERS IV SOLN: INTRAVENOUS | Status: DC | PRN

## 2020-06-17 MED ORDER — LIP MEDEX EX OINT
TOPICAL_OINTMENT | CUTANEOUS | Status: AC
  Filled 2020-06-17: qty 7

## 2020-06-17 SURGICAL SUPPLY — 25 items

## 2020-06-17 NOTE — Transfer of Care (Signed)
Immediate Anesthesia Transfer of Care Note  [name]  Procedure(s) Performed: Procedure(s): COLONOSCOPY WITH PROPOFOL (N/A) ESOPHAGOGASTRODUODENOSCOPY (EGD) WITH PROPOFOL (N/A) GI RADIOFREQUENCY ABLATION BIOPSY POLYPECTOMY FOREIGN BODY REMOVAL HEMOSTASIS CLIP PLACEMENT  Patient Location: PACU  Anesthesia Type:MAC  Level of Consciousness:  sedated, patient cooperative and responds to stimulation  Airway & Oxygen Therapy:Patient Spontanous Breathing and Patient connected to face mask oxgen  Post-op Assessment:  Report given to PACU RN and Post -op Vital signs reviewed and stable  Post vital signs:  Reviewed and stable  Last Vitals:  Vitals:   06/17/20 0500 06/17/20 1059  BP: (!) 152/59 (!) 168/57  Pulse: 69 69  Resp: 18 17  Temp: 36.6 C 36.9 C  SpO2: 86% 76%    Complications: No apparent anesthesia complications

## 2020-06-17 NOTE — Progress Notes (Signed)
PROGRESS NOTE    KYTZIA GIENGER  QIW:[id number] DOB: November 03, 1940 DOA: 06/15/2020 PCP: Brunetta Jeans, PA-C   Brief Narrative:  HPI per Dr. Cherylann Ratel on 06/15/20 TAYLON LOUISON is a 79 y.o. female with medical history significant of colon CA, RLE DVT, cirrohosis. Presenting with fatigue and dark stools. She reports that she was in the hospital about 6 weeks ago. During that time, it was discovered that she had a RLE DVT and she was started on xarelto. A week after leaving the hospital, she noted intermittent BRBPR. It was not associated with any pain. She noted as the days progressed, it has changed to having black stools daily. During this time she has felt weak and unable to do her normal activities. She spoke with her PCP, who recommended coming to the ED to get a transfusion. So she did.     ED Course: Her Hgb was noted to be about 3pts off her baseline. EDP spoke with [facility] who recommended admission. She was ordered a unit of pRBCs. [facility] was called for admission.   **Interim History  Gastroenterology was consulted and Underwent an EGD and colonoscopy this morning.  She was transfused 1 unit PRBCs in the ED.  Hemoglobin has been improving.  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  The stomach was biopsied for Helicobacter pylori and there is no gross lesions in the duodenal bulb and in the first portion or second portion of the duodenum.  Colonoscopy showed some rectal polyps that were clipped and removed but official operative report is still pending.  GI recommended continuing pantoprazole 40 mg p.o. twice daily and starting Carafate slurry and consider potential change of atenolol to carvedilol or nadolol or propranolol to decrease risk of progression and bleeding varices.  Repeat EGD  was recommended in 2 to 3 years if beta-blockade was initiated or if iron deficiency anemia persist.  The recommendation was to start iron supplementation 1 to 2 weeks with p.o. iron and considering IV iron transfusion while in house.  Assessment & Plan:   Active Problems:   GIB (gastrointestinal bleeding)   GI bleeding  Heme Positive Stools likely in the setting of GAVE (Gastric antral vascular ectasia) Acute Symptomatic Anemia Superimposed on Chronic Normocytic Anemia -Admitted to Observation Telemetry  -Continue to hold Xarelto, check q6h H&H, add protonix -Transfuse as necessary especially for Hgb <7.0; she's received 1 unit pRBCs in the ED -Hgb/Hct relatively stable now on last check was 9.0/29.9 and this morning was 8.8/28.4 -Gastroenterology evaluating and pursuing an EGD and colonoscopy tomorrow with Dr. Rudene Christians already -Because the patient was having some nausea gastroenterology has ordered IV Reglan 10 mg to be given twice with prep -Continued IV PPI every 12h changed to Protonix 40 mg p.o. 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  The stomach was biopsied for Helicobacter pylori and there is no gross lesions in the duodenal bulb and in the first portion or second portion of the duodenum.  GI recommended continuing pantoprazole 40 mg p.o. twice daily and starting Carafate slurry and consider potential change of atenolol to  carvedilol or nadolol or propranolol to decrease risk of progression and bleeding varices.  Repeat EGD was recommended in 2 to 3 years if beta-blockade was initiated or if iron deficiency anemia persist.  The recommendation was to start iron supplementation 1 to 2 weeks with p.o. iron and considering IV iron transfusion while in house. -Colonoscopy showed some rectal polyps that were clipped and removed but official operative report is still pending.  Dr. Rush Landmark is going to make some anticoagulation recommendations but this is on the colonoscopy report which is still pending -I personally spoke with Dr. Justice Britain who recommends observing the patient overnight and anticipating discharging home in the morning -We have initiated GI recommendations and changed her atenolol to propranolol to decrease the risk of progression and bleeding varices and will need to follow-up on the biopsies of the stomach.  We have initiated PPI 4 mg twice daily and started Carafate suspension 3 times daily with meals and before bedtime -We will order IV iron in the a.m.   Hx of DVT on Antiocagulation with Xarleto -Continue to Hold Xarelto while awaiting the EGD and Colonoscopy; final anticoagulation recommendations to be made by Dr. 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 -Had a Paracentesis back in October; Appears compensated currently  -INR was 1.3 -Continue to hold Lasix/Spironolactone for now per GI rec resume once they okay this -Gastroenterology started the patient on IV Rocephin for SBP prophylaxis -Denies any Abdominal Pain and last WBC was 4.0 -Further Recc's and evaluation and management by Gastroenterology    -We will start on propanolol 80 daily  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 followed by Adjuvant Chemo -Undergoing a colonoscopy in the AM and per discussion with GI the colonoscopy did not show any recurrence of colon cancer  Liver Lesion -Noted on Noncontrast CT Scan in October with concern for possible HCC -AFP ordered by GI and was 3.6 -Patient will eventually need Imaging and they are going to consider an MRI or a Contrasted Triple phase Protocol to evaluate further but defer timing of this to GI  HUD1S Metabolic Acidosis -Baseline Scr is ~ 1.1; she's 1.31 at admission -BUN/Cr is stable at 12/1.20 -Continue hold Lasix/Spironolactone for now per GI rec and resume in a.m. more than likely -She has a Mild Metabolic Acidosis with a CO2 of 19, anion gap of 10, chloride level of 110 -Continue monitor and trend repeat CMP in a.m.  DM2 -C/w Sensitive Novolog SSI AC and a Clear Liquid Diet -Last A1c 6.2 on 04/29/20 -Continue to Monitor CBG's closely; CBG's ranging from 125-131  HTN -Continue Amlodipine 2.5 mg po Daily, Telmisartan subsitution with Irbesartan 75 mg po BID -Atenolol 50 mg p.o. daily will be changed to propranolol 80 mg  daily -Monitor BP per Protocol  -Last BP was on the softer side at 144/61  Hypothyroidism -Continue Levothyroxine 88 mcg po Daily  Obesity -Complicates overall prognosis and care -Estimated body mass index is 33.04 kg/m as calculated from the following:   Height as of this encounter: 5\' 7"  (1.702 m).   Weight as of this encounter: 95.7 kg. -Weight Loss and Dietary Counseling given   GOC: DNR, poA  DVT prophylaxis: SCDs Code Status: DO NOT RESUSCITATE  Family Communication: No family present at bedside  Disposition Plan: Pending further clinical improvement and clearance by Gastroenterology and will also obtain PT and OT for further evaluation; anticipating discharging home in the a.m. as GI recommends observing  overnight  Status is: Inpatient  Remains inpatient appropriate because:Unsafe d/c plan, IV treatments appropriate due to intensity of illness or inability to take PO and Inpatient level of care appropriate due to severity of illness   Dispo: The patient is from: Home              Anticipated d/c is to: TBD              Anticipated d/c date is: 1 day              Patient currently is not medically stable to d/c.  Consultants:   Gastroenterology Dr. Rush Landmark   Procedures:  EGD Findings:      No gross lesions were noted in the proximal esophagus and in the mid       esophagus.      Grade I, grade II varices were found in the distal esophagus - no       stigmata of recent bleeding.      Moderate, diffuse portal hypertensive gastropathy was found in the       cardia, in the gastric fundus and in the gastric body.      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 Focal radiofrequency ablation of gastric antral vascular       ectasia in the stomach was performed. With the endoscope in place, the       position and extent of the abnormal mucosa and appropriate anatomic       landmarks were noted. The abnormal mucosa was irrigated with water.       Gastric contents were suctioned. The endoscope was then removed from the       patient. The Barrx-90 radiofrequency ablation catheter was attached to       the tip of the endoscope. The endoscope with the attached radiofrequency       ablation catheter was then passed under direct vision and advanced to       the areas of abnormal mucosa. The radiofrequency ablation catheter was       placed in contact with the surface of the abnormal mucosa under direct       visualization and energy was applied twice at 12 J/cm2. Ablation was       repeated in a likewise fashion to all visible abnormal mucosa. The areas       where abnormal mucosa had been ablated were examined. Areas of abnormal        mucosa appeared completely ablated. The total number of energy       applications for all mucosal sites treated was 31.      No other gross lesions were noted in the entire examined stomach -  no       gastric varcies noted. Biopsies were taken with a cold forceps for       histology and Helicobacter pylori testing.      No gross lesions were noted in the duodenal bulb, in the first portion       of the duodenum and in the second portion of the duodenum. Biopsies for       histology were taken with a cold forceps for evaluation of celiac       disease. Impression:               - No gross lesions in esophagus proximally. 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                           - Portal hypertensive gastropathy.                           - Gastric antral vascular ectasia with bleeding.                            Treated with radiofrequency ablation.                           - Biopsied stomach for HP evaluation.                           - No gross lesions in the duodenal bulb, in the                            first portion of the duodenum and in the second                            portion of the duodenum. Biopsied.  COLONOSCOPY Report is still pending   Antimicrobials:  Anti-infectives (From admission, onward)   Start     Dose/Rate Route Frequency Ordered Stop   06/16/20 0600  cefTRIAXone (ROCEPHIN) 2 g in sodium chloride 0.9 % 100 mL IVPB        2 g 200 mL/hr over 30 Minutes Intravenous Every 24 hours 06/15/20 1857     06/15/20 0945  cefTRIAXone (ROCEPHIN) 1 g in sodium chloride 0.9 % 100 mL IVPB        1 g 200 mL/hr over 30 Minutes Intravenous  Once 06/15/20 0932 06/15/20 1030        Subjective: Seen and examined at bedside prior to her EGD and colonoscopy and she states that she is not nauseous the morning.  Had multiple bowel movements.  Denies any chest pain, lightheadedness or dizziness.  Thinks she is doing a little bit better  today.  Wanting to go home after her EGD and colonoscopy.  No shortness of breath.  No other concerns or complaints at this time.  Objective: Vitals:   06/17/20 1420 06/17/20 1430 06/17/20 1440 06/17/20 1450  BP: (!) 127/50 (!) 125/56 (!) 142/61 (!) 144/61  Pulse: 69 69 69 65  Resp: (!) 23 (!) 23 (!) 21 20  Temp: (!) 95.7 F (  35.4 C)     TempSrc: Temporal     SpO2: 100% 95% 95% 94%  Weight:      Height:        Intake/Output Summary (Last 24 hours) at 06/17/2020 1531 Last data filed at 06/17/2020 1350 Gross per 24 hour  Intake 1360 ml  Output 500 ml  Net 860 ml   Filed Weights   06/15/20 0906 06/15/20 1553 06/17/20 1059  Weight: 97.5 kg 95.7 kg 95.7 kg   Examination: Physical Exam:  Constitutional: WN/WD obese Caucasian female currently no acute distress and is calm Eyes: Lids and conjunctivae normal, sclerae anicteric  ENMT: External Ears, Nose appear normal. Grossly normal hearing.  Neck: Appears normal, supple, no cervical masses, normal ROM, no appreciable thyromegaly; no JVD Respiratory: Diminished to auscultation bilaterally, no wheezing, rales, rhonchi or crackles. Normal respiratory effort and patient is not tachypenic. No accessory muscle use. Unlabored breathing Cardiovascular: RRR, no murmurs / rubs / gallops. S1 and S2 auscultated. Minimal extremity edema Abdomen: Soft, non-tender, Distended due to body habitus. Bowel sounds positive.  GU: Deferred. Musculoskeletal: No clubbing / cyanosis of digits/nails. No joint deformity upper and lower extremities.  Skin: No rashes, lesions, ulcers on a limited skin evaluation. No induration; Warm and dry.  Neurologic: CN 2-12 grossly intact with no focal deficits. Romberg sign and cerebellar reflexes not assessed.  Psychiatric: Normal judgment and insight. Alert and oriented x 3. Normal mood and appropriate affect.   Data Reviewed: I have personally reviewed following labs and imaging studies  CBC: Recent Labs  Lab  06/14/20 1320 06/14/20 1320 06/15/20 0923 06/15/20 1558 06/16/20 0951 06/16/20 1652 06/16/20 2256 06/17/20 0552 06/17/20 1029  WBC 4.5  --  4.8  --   --   --   --  4.0  --   NEUTROABS 2.3  --  3.0  --   --   --   --  2.3  --   HGB 8.6 Repeated and verified X2.*   < > 8.4*   < > 9.5* 9.8* 9.2* 8.8*   8.8* 9.0*  HCT 27.4*   < > 28.6*   < > 31.8* 33.3* 29.4* 29.4*   28.4* 29.9*  MCV 92.4  --  98.6  --   --   --   --  98.0  --   PLT 119.0*  --  127*  --   --   --   --  106*  --    < > = values in this interval not displayed.   Basic Metabolic Panel: Recent Labs  Lab 06/15/20 0923 06/16/20 0419 06/17/20 0552  NA 142 140 139  K 4.7 4.1 4.3  CL 110 111 110  CO2 23 21* 19*  GLUCOSE 169* 100* 131*  BUN 21 16 12   CREATININE 1.31* 1.15* 1.20*  CALCIUM 8.8* 8.7* 8.7*  MG  --   --  1.8  PHOS  --   --  3.6   GFR: Estimated Creatinine Clearance: 45.9 mL/min (A) (by C-G formula based on SCr of 1.2 mg/dL (H)). Liver Function Tests: Recent Labs  Lab 06/15/20 0923 06/16/20 0419 06/17/20 0552  AST 34 23 26  ALT 15 13 15   ALKPHOS 72 68 67  BILITOT 1.0 1.2 0.9  PROT 6.2* 5.4* 5.3*  ALBUMIN 3.2* 3.0* 2.8*   No results for input(s): LIPASE, AMYLASE in the last 168 hours. No results for input(s): AMMONIA in the last 168 hours. Coagulation Profile: Recent Labs  Lab 06/15/20 575 128 2489  INR 1.3*   Cardiac Enzymes: No results for input(s): CKTOTAL, CKMB, CKMBINDEX, TROPONINI in the last 168 hours. BNP (last 3 results) Recent Labs    03/30/20 1507  PROBNP 250   HbA1C: No results for input(s): HGBA1C in the last 72 hours. CBG: Recent Labs  Lab 06/16/20 0731 06/16/20 1138 06/16/20 1725 06/16/20 2124 06/17/20 0730  GLUCAP 118* 131* 123* 125* 129*   Lipid Profile: No results for input(s): CHOL, HDL, LDLCALC, TRIG, CHOLHDL, LDLDIRECT in the last 72 hours. Thyroid Function Tests: No results for input(s): TSH, T4TOTAL, FREET4, T3FREE, THYROIDAB in the last 72 hours. Anemia  Panel: No results for input(s): VITAMINB12, FOLATE, FERRITIN, TIBC, IRON, RETICCTPCT in the last 72 hours. Sepsis Labs: No results for input(s): PROCALCITON, LATICACIDVEN in the last 168 hours.  Recent Results (from the past 240 hour(s))  Resp Panel by RT-PCR (Flu A&B, Covid) Nasopharyngeal Swab     Status: None   Collection Time: 06/15/20 10:30 AM   Specimen: Nasopharyngeal Swab; Nasopharyngeal(NP) swabs in vial transport medium  Result Value Ref Range Status   SARS Coronavirus 2 by RT PCR NEGATIVE NEGATIVE Final    Comment: (NOTE) SARS-CoV-2 target nucleic acids are NOT DETECTED.  The SARS-CoV-2 RNA is generally detectable in upper respiratory specimens during the acute phase of infection. The lowest concentration of SARS-CoV-2 viral copies this assay can detect is 138 copies/mL. A negative result does not preclude SARS-Cov-2 infection and should not be used as the sole basis for treatment or other patient management decisions. A negative result may occur with  improper specimen collection/handling, submission of specimen other than nasopharyngeal swab, presence of viral mutation(s) within the areas targeted by this assay, and inadequate number of viral copies(<138 copies/mL). A negative result must be combined with clinical observations, patient history, and epidemiological information. The expected result is Negative.  Fact Sheet for Patients:  EntrepreneurPulse.com.au  Fact Sheet for Healthcare Providers:  IncredibleEmployment.be  This test is no t yet approved or cleared by the [place] FDA and  has been authorized for detection and/or diagnosis of SARS-CoV-2 by FDA under an Emergency Use Authorization (EUA). This EUA will remain  in effect (meaning this test can be used) for the duration of the COVID-19 declaration under Section 564(b)(1) of the Act, 21 U.S.C.section 360bbb-3(b)(1), unless the authorization is terminated  or  revoked sooner.       Influenza A by PCR NEGATIVE NEGATIVE Final   Influenza B by PCR NEGATIVE NEGATIVE Final    Comment: (NOTE) The Xpert Xpress SARS-CoV-2/FLU/RSV plus assay is intended as an aid in the diagnosis of influenza from Nasopharyngeal swab specimens and should not be used as a sole basis for treatment. Nasal washings and aspirates are unacceptable for Xpert Xpress SARS-CoV-2/FLU/RSV testing.  Fact Sheet for Patients: EntrepreneurPulse.com.au  Fact Sheet for Healthcare Providers: IncredibleEmployment.be  This test is not yet approved or cleared by the [place] FDA and has been authorized for detection and/or diagnosis of SARS-CoV-2 by FDA under an Emergency Use Authorization (EUA). This EUA will remain in effect (meaning this test can be used) for the duration of the COVID-19 declaration under Section 564(b)(1) of the Act, 21 U.S.C. section 360bbb-3(b)(1), unless the authorization is terminated or revoked.  Performed at [facility], [place] [address], [place], [place] [address]      RN Pressure Injury Documentation:     Estimated body mass index is 33.04 kg/m as calculated from the following:   Height as of this encounter: 5'  7" (1.702 m).   Weight as of this encounter: 95.7 kg.  Malnutrition Type:      Malnutrition Characteristics:      Nutrition Interventions:        Radiology Studies: No results found.   Scheduled Meds:  amLODipine  2.5 mg Oral Daily   atenolol  50 mg Oral Daily   insulin aspart  0-9 Units Subcutaneous TID WC   irbesartan  75 mg Oral BID   levothyroxine  88 mcg Oral QAC breakfast   pantoprazole  40 mg Oral BID   sucralfate  1 g Oral TID WC & HS   Continuous Infusions:  sodium chloride 10 mL/hr at 06/16/20 0643   cefTRIAXone (ROCEPHIN)  IV 2 g (06/17/20 0609)     LOS: 1 day   Kerney Elbe, DO [facility] PAGER is on [place]  If  7PM-7AM, please contact night-coverage [email or web address]

## 2020-06-17 NOTE — Op Note (Addendum)
[facility] Patient Name: Cheryl Jordan Procedure Date: 06/17/2020 MRN: [id number] Attending MD: Justice Britain , MD Date of Birth: August 17, 1940 CSN: [id number] Age: 79 Admit Type: Outpatient Procedure:                Colonoscopy Indications:              Hematochezia, Heme positive stool, Gastrointestinal                            occult blood loss Providers:                Justice Britain, MD, Wynonia Sours, RN, Janee Morn, Technician Referring MD:             Scarlette Shorts, MD, [facility] Medicines:                Monitored Anesthesia Care Complications:            No immediate complications. Estimated Blood Loss:     Estimated blood loss was minimal. Procedure:                Pre-Anesthesia Assessment:                           - Prior to the procedure, a History and Physical                            was performed, and patient medications and                            allergies were reviewed. The patient's tolerance of                            previous anesthesia was also reviewed. The risks                            and benefits of the procedure and the sedation                            options and risks were discussed with the patient.                            All questions were answered, and informed consent                            was obtained. Prior Anticoagulants: The patient has                            taken Xarelto (rivaroxaban), last dose was 3 days                            prior to procedure. ASA Grade Assessment: III - A  patient with severe systemic disease. After                            reviewing the risks and benefits, the patient was                            deemed in satisfactory condition to undergo the                            procedure.                           After obtaining informed consent, the colonoscope                            was passed under  direct vision. Throughout the                            procedure, the patient's blood pressure, pulse, and                            oxygen saturations were monitored continuously. The                            [id number]-H190DL ([id number] Olympus pediatric colonscope                            was introduced through the anus and advanced to the                            the ileocolonic anastomosis. The colonoscopy was                            performed without difficulty. The patient tolerated                            the procedure. Scope In: 1:38:57 PM Scope Out: 2:13:53 PM Scope Withdrawal Time: 0 hours 31 minutes 25 seconds  Total Procedure Duration: 0 hours 34 minutes 56 seconds  Findings:      The digital rectal exam findings include hemorrhoids. Pertinent       negatives include no palpable rectal lesions.      The neo-terminal ileum appeared normal.      There was evidence of a prior functional end-to-end ileo-colonic       anastomosis in the ascending colon. This was patent and was       characterized by healthy appearing mucosa, an intact staple and a       visible suture. The anastomosis was traversed. Removal of the staple and       suture was accomplished with a regular forceps.      Six sessile polyps were found in the rectum (1), recto-sigmoid colon       (1), sigmoid colon (1), descending colon (1) and transverse colon (2).       The polyps were 2 to 6 mm in size. These polyps were removed with a cold  snare. Resection and retrieval were complete. To prevent bleeding after       the rectal polypectomy, two hemostatic clips were successfully placed       (MR conditional). There was no bleeding at the end of the procedure.      Diffuse area of mildly erythematous mucosa was found in the mid rectum       and the distal rectum. This was biopsied with a cold forceps for       histology to rule out chronic proctitis.      Normal mucosa was found in the entire colon  otherwise.      Non-bleeding non-thrombosed external and internal hemorrhoids were found       during retroflexion, during perianal exam and during digital exam. The       hemorrhoids were Grade III (internal hemorrhoids that prolapse but       require manual reduction). Impression:               - Hemorrhoids found on digital rectal exam.                           - The examined portion of the ileum was normal.                           - Patent functional end-to-end ileo-colonic                            anastomosis, characterized by healthy appearing                            mucosa, an intact staple line and visible sutures.                           - Six 2 to 6 mm polyps in the rectum, at the                            recto-sigmoid colon, in the sigmoid colon, in the                            descending colon and in the transverse colon,                            removed with a cold snare. Resected and retrieved.                           - Erythematous mucosa in the mid rectum and in the                            distal rectum. Biopsied.                           - Normal mucosa in the entire examined colon                            otherwise.                           -  Non-bleeding non-thrombosed external and internal                            hemorrhoids. Moderate Sedation:      Not Applicable - Patient had care per Anesthesia. Recommendation:           - The patient will be observed post-procedure,                            until all discharge criteria are met.                           - Return patient to hospital ward for ongoing care.                           - Resume regular diet.                           - Await pathology results.                           - Repeat colonoscopy for surveillance based on                            pathology results.                           - Xarelto should be held for at least 72 hours                            before restart  of NOAC to decrease risk of bleeding                            post-interventional bleeding.                           - Expect small amount of hematochezia to be                            possible post-procedure.                           - The findings and recommendations were discussed                            with the referring physician. Procedure Code(s):        --- Professional ---                           45385, Colonoscopy, flexible; with removal of                            tumor(s), polyp(s), or other lesion(s) by snare                            technique                             [phone], Colonoscopy, flexible; with removal of                            foreign body(s)                           45380, 58, Colonoscopy, flexible; with biopsy,                            single or multiple Diagnosis Code(s):        --- Professional ---                           K64.2, Third degree hemorrhoids                           Z98.0, Intestinal bypass and anastomosis status                           K62.1, Rectal polyp                           K63.5, Polyp of colon                           K62.89, Other specified diseases of anus and rectum                           K92.1, Melena (includes Hematochezia)                           R19.5, Other fecal abnormalities CPT copyright 2019 [employer]. All rights reserved. The codes documented in this report are preliminary and upon coder review may  be revised to meet current compliance requirements. Justice Britain, MD 06/17/2020 4:34:20 PM Number of Addenda: 0

## 2020-06-17 NOTE — Anesthesia Postprocedure Evaluation (Signed)
Anesthesia Post Note  [name]  Procedure(s) Performed: COLONOSCOPY WITH PROPOFOL (N/A ) ESOPHAGOGASTRODUODENOSCOPY (EGD) WITH PROPOFOL (N/A ) GI RADIOFREQUENCY ABLATION BIOPSY POLYPECTOMY FOREIGN BODY REMOVAL HEMOSTASIS CLIP PLACEMENT     Patient location during evaluation: Endoscopy Anesthesia Type: MAC Level of consciousness: awake and alert Pain management: pain level controlled Vital Signs Assessment: post-procedure vital signs reviewed and stable Respiratory status: spontaneous breathing, nonlabored ventilation, respiratory function stable and patient connected to nasal cannula oxygen Cardiovascular status: stable and blood pressure returned to baseline Postop Assessment: no apparent nausea or vomiting Anesthetic complications: no   No complications documented.  Last Vitals:  Vitals:   06/17/20 1440 06/17/20 1450  BP: (!) 142/61 (!) 144/61  Pulse: 69 65  Resp: (!) 21 20  Temp:    SpO2: 95% 94%    Last Pain:  Vitals:   06/17/20 1450  TempSrc:   PainSc: 0-No pain                 [name] P Joyell Emami

## 2020-06-17 NOTE — Interval H&P Note (Signed)
History and Physical Interval Note:  06/17/2020 11:47 AM  Cheryl Jordan  has presented today for surgery, with the diagnosis of anemia, hemoccult positive stool, rectal bleeding.  The various methods of treatment have been discussed with the patient and family. After consideration of risks, benefits and other options for treatment, the patient has consented to  Procedure(s): COLONOSCOPY WITH PROPOFOL (N/A) ESOPHAGOGASTRODUODENOSCOPY (EGD) WITH PROPOFOL (N/A) as a surgical intervention.  The patient's history has been reviewed, patient examined, no change in status, stable for surgery.  I have reviewed the patient's chart and labs.  Questions were answered to the patient's satisfaction.     [employer]

## 2020-06-17 NOTE — Anesthesia Preprocedure Evaluation (Signed)
Anesthesia Evaluation  Patient identified by MRN, date of birth, ID band Patient awake    Reviewed: Patient's Chart, lab work & pertinent test results, reviewed documented beta blocker date and time   History of Anesthesia Complications (+) PONV  Airway Mallampati: II  TM Distance: >3 FB Neck ROM: Full    Dental  (+) Partial Lower, Partial Upper   Pulmonary neg pulmonary ROS,    Pulmonary exam normal        Cardiovascular hypertension, Pt. on medications and Pt. on home beta blockers  Rhythm:Regular Rate:Normal     Neuro/Psych Anxiety negative neurological ROS     GI/Hepatic Neg liver ROS, GERD  Medicated,Rectal bleeding   Endo/Other  diabetes, Well Controlled, Type 2, Insulin DependentHypothyroidism   Renal/GU   negative genitourinary   Musculoskeletal  (+) Arthritis , Osteoarthritis,    Abdominal (+)  Abdomen: soft. Bowel sounds: normal.  Peds  Hematology  (+) anemia ,   Anesthesia Other Findings   Reproductive/Obstetrics                             Anesthesia Physical Anesthesia Plan  ASA: II  Anesthesia Plan: MAC   Post-op Pain Management:    Induction: Intravenous  PONV Risk Score and Plan: 3  Airway Management Planned: Simple Face Mask, Natural Airway and Nasal Cannula  Additional Equipment: None  Intra-op Plan:   Post-operative Plan:   Informed Consent: I have reviewed the patients History and Physical, chart, labs and discussed the procedure including the risks, benefits and alternatives for the proposed anesthesia with the patient or authorized representative who has indicated his/her understanding and acceptance.   Patient has DNR.  Discussed DNR with patient.   Dental advisory given  Plan Discussed with: CRNA  Anesthesia Plan Comments: (Lab Results      Component                Value               Date                      WBC                      4.0                  06/17/2020                HGB                      9.0 (L)             06/17/2020                HCT                      29.9 (L)            06/17/2020                MCV                      98.0                06/17/2020                PLT  106 (L)             06/17/2020          )        Anesthesia Quick Evaluation

## 2020-06-17 NOTE — Op Note (Signed)
[facility] Patient Name: Cheryl Jordan Procedure Date: 06/17/2020 MRN: [id number] Attending MD: Justice Britain , MD Date of Birth: Sep 01, 1940 CSN: [id number] Age: 79 Admit Type: Outpatient Procedure:                Upper GI endoscopy Indications:              Iron deficiency anemia, Heme positive stool,                            Melena, Occult blood in stool, Cirrhosis rule out                            esophageal varices Providers:                Justice Britain, MD, Wynonia Sours, RN, Janee Morn, Technician Referring MD:             Scarlette Shorts, MD, [facility] Medicines:                Monitored Anesthesia Care Complications:            No immediate complications. Estimated Blood Loss:     Estimated blood loss was minimal. Procedure:                Pre-Anesthesia Assessment:                           - Prior to the procedure, a History and Physical                            was performed, and patient medications and                            allergies were reviewed. The patient's tolerance of                            previous anesthesia was also reviewed. The risks                            and benefits of the procedure and the sedation                            options and risks were discussed with the patient.                            All questions were answered, and informed consent                            was obtained. Prior Anticoagulants: The patient has                            taken Xarelto (rivaroxaban), last dose was 3 days  prior to procedure. ASA Grade Assessment: III - A                            patient with severe systemic disease. After                            reviewing the risks and benefits, the patient was                            deemed in satisfactory condition to undergo the                            procedure.                           After obtaining  informed consent, the endoscope was                            passed under direct vision. Throughout the                            procedure, the patient's blood pressure, pulse, and                            oxygen saturations were monitored continuously. The                            [id number]-[id number] ([id number] Olympus gastroscope was                            introduced through the mouth, and advanced to the                            second part of duodenum. The upper GI endoscopy was                            accomplished without difficulty. The patient                            tolerated the procedure. Scope In: Scope Out: Findings:      No gross lesions were noted in the proximal esophagus and in the mid       esophagus.      Grade I, grade II varices were found in the distal esophagus - no       stigmata of recent bleeding.      Moderate, diffuse portal hypertensive gastropathy was found in the       cardia, in the gastric fundus and in the gastric body.      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 Focal radiofrequency ablation of gastric antral vascular       ectasia in the stomach was performed. With the endoscope in place, the       position and extent of the abnormal mucosa and appropriate anatomic       landmarks were noted.  The abnormal mucosa was irrigated with water.       Gastric contents were suctioned. The endoscope was then removed from the       patient. The Barrx-90 radiofrequency ablation catheter was attached to       the tip of the endoscope. The endoscope with the attached radiofrequency       ablation catheter was then passed under direct vision and advanced to       the areas of abnormal mucosa. The radiofrequency ablation catheter was       placed in contact with the surface of the abnormal mucosa under direct       visualization and energy was applied twice at 12 J/cm2. Ablation was       repeated in a  likewise fashion to all visible abnormal mucosa. The areas       where abnormal mucosa had been ablated were examined. Areas of abnormal       mucosa appeared completely ablated. The total number of energy       applications for all mucosal sites treated was 31.      No other gross lesions were noted in the entire examined stomach - no       gastric varcies noted. Biopsies were taken with a cold forceps for       histology and Helicobacter pylori testing.      No gross lesions were noted in the duodenal bulb, in the first portion       of the duodenum and in the second portion of the duodenum. Biopsies for       histology were taken with a cold forceps for evaluation of celiac       disease. Impression:               - No gross lesions in esophagus proximally. 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                           - Portal hypertensive gastropathy.                           - Gastric antral vascular ectasia with bleeding.                            Treated with radiofrequency ablation.                           - Biopsied stomach for HP evaluation.                           - No gross lesions in the duodenal bulb, in the                            first portion of the duodenum and in the second                            portion of the duodenum. Biopsied. Moderate Sedation:      Not Applicable - Patient had care per Anesthesia. Recommendation:           -  Proceed to scheduled colonoscopy.                           - Protonix/Omeprazole 40 mg twice daily.                           - Start Carafate BID.                           - Anticoagulation notation in the Colonoscopy                            report.                           - Consider potential change of Atenolol to                            Carvedilol or Nadolol or Propanolol to decrease                            risk of progression/bleeding of varices.                            - Repeat EGD recommended in 2-3 years if                            beta-blockade is initiated or if IDA persists.                           - In 1-2 weeks start PO Iron.                           - Consider IV Iron infusion while inhouse.                           - The findings and recommendations were discussed                            with the patient.                           - The findings and recommendations were discussed                            with the patient's family.                           - The findings and recommendations were discussed                            with the referring physician. Procedure Code(s):        --- Professional ---                           [phone], Esophagogastroduodenoscopy, flexible,  transoral; with biopsy, single or multiple Diagnosis Code(s):        --- Professional ---                           K74.60, Unspecified cirrhosis of liver                           I85.10, Secondary esophageal varices without                            bleeding                           K76.6, Portal hypertension                           K31.89, Other diseases of stomach and duodenum                           K31.811, Angiodysplasia of stomach and duodenum                            with bleeding                           D50.9, Iron deficiency anemia, unspecified                           R19.5, Other fecal abnormalities                           K92.1, Melena (includes Hematochezia) CPT copyright 2019 [employer] Association. All rights reserved. The codes documented in this report are preliminary and upon coder review may  be revised to meet current compliance requirements. Justice Britain, MD 06/17/2020 3:18:26 PM Number of Addenda: 0

## 2020-06-18 DIAGNOSIS — I8510 Secondary esophageal varices without bleeding: Secondary | ICD-10-CM

## 2020-06-18 DIAGNOSIS — Z86010 Personal history of colonic polyps: Secondary | ICD-10-CM

## 2020-06-18 LAB — COMPREHENSIVE METABOLIC PANEL
ALT: 13 U/L (ref 0–44)
AST: 22 U/L (ref 15–41)
Albumin: 2.9 g/dL — ABNORMAL LOW (ref 3.5–5.0)
Alkaline Phosphatase: 76 U/L (ref 38–126)
Anion gap: 12 (ref 5–15)
BUN: 12 mg/dL (ref 8–23)
CO2: 19 mmol/L — ABNORMAL LOW (ref 22–32)
Calcium: 8.4 mg/dL — ABNORMAL LOW (ref 8.9–10.3)
Chloride: 106 mmol/L (ref 98–111)
Creatinine, Ser: 1.13 mg/dL — ABNORMAL HIGH (ref 0.44–1.00)
GFR, Estimated: 50 mL/min — ABNORMAL LOW (ref >60)
Glucose, Bld: 133 mg/dL — ABNORMAL HIGH (ref 70–99)
Potassium: 4 mmol/L (ref 3.5–5.1)
Sodium: 137 mmol/L (ref 135–145)
Total Bilirubin: 0.7 mg/dL (ref 0.3–1.2)
Total Protein: 5.5 g/dL — ABNORMAL LOW (ref 6.5–8.1)

## 2020-06-18 LAB — GLUCOSE, CAPILLARY
Glucose-Capillary: 119 mg/dL — ABNORMAL HIGH (ref 70–99)
Glucose-Capillary: 126 mg/dL — ABNORMAL HIGH (ref 70–99)
Glucose-Capillary: 131 mg/dL — ABNORMAL HIGH (ref 70–99)
Glucose-Capillary: 137 mg/dL — ABNORMAL HIGH (ref 70–99)

## 2020-06-18 LAB — CBC WITH DIFFERENTIAL/PLATELET
Abs Immature Granulocytes: 0 10*3/uL (ref 0.00–0.07)
Basophils Absolute: 0 10*3/uL (ref 0.0–0.1)
Basophils Relative: 1 %
Eosinophils Absolute: 0.1 10*3/uL (ref 0.0–0.5)
Eosinophils Relative: 2 %
HCT: 29.3 % — ABNORMAL LOW (ref 36.0–46.0)
Hemoglobin: 9 g/dL — ABNORMAL LOW (ref 12.0–15.0)
Immature Granulocytes: 0 %
Lymphocytes Relative: 34 %
Lymphs Abs: 1.4 10*3/uL (ref 0.7–4.0)
MCH: 30.2 pg (ref 26.0–34.0)
MCHC: 30.7 g/dL (ref 30.0–36.0)
MCV: 98.3 fL (ref 80.0–100.0)
Monocytes Absolute: 0.2 10*3/uL (ref 0.1–1.0)
Monocytes Relative: 5 %
Neutro Abs: 2.4 10*3/uL (ref 1.7–7.7)
Neutrophils Relative %: 58 %
Platelets: 101 10*3/uL — ABNORMAL LOW (ref 150–400)
RBC: 2.98 MIL/uL — ABNORMAL LOW (ref 3.87–5.11)
RDW: 16.9 % — ABNORMAL HIGH (ref 11.5–15.5)
WBC: 4 10*3/uL (ref 4.0–10.5)
nRBC: 0 % (ref 0.0–0.2)

## 2020-06-18 LAB — MAGNESIUM: Magnesium: 1.8 mg/dL (ref 1.7–2.4)

## 2020-06-18 LAB — PHOSPHORUS: Phosphorus: 3.4 mg/dL (ref 2.5–4.6)

## 2020-06-18 MED ORDER — PROPRANOLOL HCL 40 MG PO TABS: 40.0000 mg | ORAL_TABLET | Freq: Two times a day (BID) | ORAL | 0 refills | Status: DC

## 2020-06-18 MED ORDER — HYDROCORTISONE ACETATE 25 MG RE SUPP: 25.0000 mg | Freq: Every day | RECTAL | Status: DC

## 2020-06-18 MED ORDER — HYDROCORTISONE 1 % EX CREA
TOPICAL_CREAM | Freq: Two times a day (BID) | CUTANEOUS | Status: DC
  Administered 2020-06-18: 1 via TOPICAL
  Filled 2020-06-18: qty 28

## 2020-06-18 MED ORDER — TELMISARTAN 40 MG PO TABS: 20.0000 mg | ORAL_TABLET | Freq: Two times a day (BID) | ORAL | Status: DC

## 2020-06-18 MED ORDER — CIPROFLOXACIN HCL 500 MG PO TABS: 500.0000 mg | ORAL_TABLET | Freq: Two times a day (BID) | ORAL | Status: DC

## 2020-06-18 MED ORDER — CIPROFLOXACIN HCL 500 MG PO TABS: 500.0000 mg | ORAL_TABLET | Freq: Two times a day (BID) | ORAL | 0 refills | Status: AC

## 2020-06-18 MED ORDER — HYDROCORTISONE ACETATE 25 MG RE SUPP: 25.0000 mg | RECTAL | Status: DC

## 2020-06-18 MED ORDER — SUCRALFATE 1 GM/10ML PO SUSP: 1.0000 g | Freq: Two times a day (BID) | ORAL | Status: DC

## 2020-06-18 MED ORDER — HYDROCORTISONE 1 % EX CREA
TOPICAL_CREAM | Freq: Two times a day (BID) | CUTANEOUS | 0 refills | Status: DC
Start: 2020-06-18 — End: 2020-06-18

## 2020-06-18 MED ORDER — CALCIUM POLYCARBOPHIL 625 MG PO TABS: 625.0000 mg | ORAL_TABLET | Freq: Every day | ORAL | 0 refills | Status: DC

## 2020-06-18 MED ORDER — HYDROCORTISONE 1 % EX CREA
TOPICAL_CREAM | Freq: Two times a day (BID) | CUTANEOUS | 0 refills | Status: DC
Start: 2020-06-18 — End: 2020-08-04

## 2020-06-18 MED ORDER — OMEPRAZOLE 20 MG PO CPDR: 20.0000 mg | DELAYED_RELEASE_CAPSULE | Freq: Two times a day (BID) | ORAL | 0 refills | Status: DC

## 2020-06-18 MED ORDER — SUCRALFATE 1 G PO TABS: 1.0000 g | ORAL_TABLET | Freq: Two times a day (BID) | ORAL | 0 refills | Status: DC

## 2020-06-18 MED ORDER — HYDROCORTISONE ACETATE 25 MG RE SUPP: RECTAL | 0 refills | Status: DC

## 2020-06-18 MED ORDER — RIVAROXABAN 20 MG PO TABS: 20.0000 mg | ORAL_TABLET | Freq: Every day | ORAL | 0 refills | Status: DC

## 2020-06-18 MED ORDER — SODIUM CHLORIDE 0.9 % IV SOLN
510.0000 mg | Freq: Once | INTRAVENOUS | Status: AC
  Administered 2020-06-18: 510 mg via INTRAVENOUS
  Filled 2020-06-18: qty 510

## 2020-06-18 MED ORDER — CALCIUM POLYCARBOPHIL 625 MG PO TABS
625.0000 mg | ORAL_TABLET | Freq: Every day | ORAL | Status: DC
  Administered 2020-06-18: 625 mg via ORAL
  Filled 2020-06-18: qty 1

## 2020-06-18 MED ORDER — HYDROCORTISONE ACETATE 25 MG RE SUPP: RECTAL | 0 refills | Status: AC

## 2020-06-18 MED ORDER — CIPROFLOXACIN HCL 500 MG PO TABS: 500.0000 mg | ORAL_TABLET | Freq: Two times a day (BID) | ORAL | 0 refills | Status: DC

## 2020-06-18 MED ORDER — CALCIUM POLYCARBOPHIL 625 MG PO TABS: 625.0000 mg | ORAL_TABLET | Freq: Every day | ORAL | 0 refills | Status: AC

## 2020-06-18 NOTE — Discharge Instructions (Signed)
Resume xarelto on 06/21/20.

## 2020-06-18 NOTE — Discharge Summary (Signed)
Physician Discharge Summary  Cheryl Jordan:[id number] DOB: May 28, 1941 DOA: 06/15/2020  PCP: Brunetta Jeans, PA-C  Admit date: 06/15/2020 Discharge date: 06/18/2020  Admitted From: Home Disposition:  Discharged to home.   Recommendations for Outpatient Follow-up:  1. Follow up with PCP in 1 week 2. Resume xarelto on 06/21/20 3. Follow up with GI in 4 - 6 weeks  Discharge Condition: Stable  CODE STATUS: DNR   Brief/Interim Summary: Cheryl Jordan a 79 y.o.femalewith medical history significant ofcolon CA, RLE DVT, cirrohosis. Presenting with fatigue and dark stools. She reports that she was in the hospital about 6 weeks ago. During that time, it was discovered that she had a RLE DVT and she was started on xarelto. A week after leaving the hospital, she noted intermittent BRBPR. It was not associated with any pain. She noted as the days progressed, it has changed to having black stools daily. During this time she has felt weak and unable to do her normal activities. She spoke with her PCP, who recommended coming to the ED to get a transfusion. So she did.  12/4: Transfused 1 unit pRBCs. Hemoglobin stated stable. GI consulted. EGD/C-scope performed. 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  The stomach was biopsied for Helicobacter pylori and there is no gross lesions in the duodenal bulb and in the first portion or second portion of the duodenum.  C-scope showed some rectal polyps that were clipped and removed. Continue pantoprazole 40 mg p.o. twice daily and carafate. Continue propranolol to decrease risk of progression and bleeding varices.  Repeat EGD was recommended in 2 to 3 years if beta-blockade was initiated or if iron deficiency anemia persist. She received IV iron dose and it is recommended she  continue PO iron for next 1 to 2 weeks. GI recommends anusol suppositories qHS for next week and then every other night for another 7 doses. GI recommends follow up on liver lesion with an MRI abdomen this visit. However, the patient is unwilling to do this at this time. I spoke with her about the continued need for anticoagulation. I explained that she will needed continued anticoagulation d/t her recent DVT. I explained that it is not recommended that she start her xarelto until 06/21/20. I explained the risks of continuing to go without anticoagulation and the risks of resuming it. I explained the purpose and benefits of anticoagulation therapy. I also explained the option to start heparin gtt today in the hospital and monitor her until we can resume her xarelto. She has declined that option. She states understanding the RvB of anticoagulation. She will resume her xarelto on 06/21/20 at home. If new bleed detected, she will call her PCP or come back to the ED. She has been instucted to follow up with her PCP this week after resuming her xarelto for a hemoglobin check. She was seen by GI this morning and is stable from GI standpoint. We will discharge to home.    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   amLODipine 2.5 MG tablet Commonly known as: NORVASC Take 1 tablet (2.5 mg total) by mouth daily.   ciprofloxacin 500 MG tablet Commonly known as: CIPRO Take 1 tablet (500 mg total) by mouth 2 (two) times daily for 3 days.   ferrous sulfate 325 (65 FE) MG EC tablet Take 1 tablet (325 mg total) by mouth 2 (two) times daily with a meal. What changed: when to take this   furosemide 40 MG tablet Commonly known as: Lasix Take 1 tablet (40 mg total) by mouth daily.   glipiZIDE 5 MG 24 hr tablet Commonly known as: GLUCOTROL XL TAKE (1) TABLET DAILY WITH BREAKFAST. What changed: See the new instructions.   hydrocortisone 25 MG suppository Commonly known as: ANUSOL-HC Place 1 suppository (25 mg total) rectally at bedtime for 7 days, THEN 1 suppository (25 mg total) every other day for 14 days. Start taking on: June 18, 2020   hydrocortisone cream 1 % Apply topically 2 (two) times daily.   levothyroxine 88 MCG tablet Commonly known as: SYNTHROID Take 1 tablet (88 mcg total) by mouth daily.   loperamide 2 MG capsule Commonly known as: IMODIUM Take 2 capsules by mouth once. Then take additional 2mg  capsule as needed for continued diarrhea. No more than 16 mg in 24 hours. What changed:   how much to take  how to take this  when to take this  reasons to take this   LORazepam 0.5 MG tablet Commonly known as: ATIVAN Take 1 tablet (0.5 mg total) by mouth 2 (two) times daily as needed for anxiety.   omeprazole 20 MG capsule Commonly known as: PriLOSEC Take 1 capsule (20 mg total) by mouth 2 (two) times daily. What changed: when to take this   ondansetron 8 MG tablet Commonly known as: ZOFRAN Take 1 tablet (8 mg total) by mouth every 8 (eight) hours as needed for nausea or vomiting.   polycarbophil  625 MG tablet Commonly known as: FIBERCON Take 1 tablet (625 mg total) by mouth daily. Start taking on: June 19, 2020   propranolol 40 MG tablet Commonly known as: INDERAL Take 1 tablet (40 mg total) by mouth 2 (two) times daily.   rivaroxaban 20 MG Tabs tablet Commonly known as: XARELTO Take 1 tablet (20 mg total) by mouth daily with supper. Resume 06/21/20 What changed: additional instructions   spironolactone 50 MG tablet Commonly known as: Aldactone Take 1 tablet (50 mg total) by mouth daily.   sucralfate 1 g tablet Commonly known as: CARAFATE Take 1 tablet (1 g total) by mouth 2 (two) times daily.   telmisartan 40 MG tablet Commonly known as: MICARDIS Take 0.5 tablets (20 mg total) by mouth in the morning and at bedtime.       Allergies  Allergen Reactions  . Aspirin Other (See Comments)    Runny nose   .  Codeine Nausea And Vomiting    Consultations:  GI  Procedures/Studies:  No results found.   Subjective: "I'm ready to go home. I'm fine."  Discharge Exam: Vitals:   06/17/20 2112 06/18/20 0536  BP: 138/67 (!) 144/62  Pulse: 77 77  Resp: 16 14  Temp: 98 F (36.7 C) 97.7 F (36.5 C)  SpO2: 95% 95%   Vitals:   06/17/20 1440 06/17/20 1450 06/17/20 2112 06/18/20 0536  BP: (!) 142/61 (!) 144/61 138/67 (!) 144/62  Pulse: 69 65 77 77  Resp: (!) 21 20 16 14   Temp:   98 F (36.7 C) 97.7 F (36.5 C)  TempSrc:   Oral Oral  SpO2: 95% 94% 95% 95%  Weight:      Height:        General: 79 y.o. female resting in bed in NAD Eyes: PERRL, normal sclera ENMT: Nares patent w/o discharge, orophaynx clear, dentition normal, ears w/o discharge/lesions/ulcers Neck: Supple, trachea midline Cardiovascular: RRR, +S1, S2, no m/g/r, equal pulses throughout Respiratory: CTABL, no w/r/r, normal WOB GI: BS+, NDNT, no masses noted, no organomegaly noted MSK: No e/c/c Skin: No rashes, bruises, ulcerations noted Neuro: A&O x 3, no focal deficits Psyc: Appropriate  interaction and affect, calm/cooperative   The results of significant diagnostics from this hospitalization (including imaging, microbiology, ancillary and laboratory) are listed below for reference.     Microbiology: Recent Results (from the past 240 hour(s))  Resp Panel by RT-PCR (Flu A&B, Covid) Nasopharyngeal Swab     Status: None   Collection Time: 06/15/20 10:30 AM   Specimen: Nasopharyngeal Swab; Nasopharyngeal(NP) swabs in vial transport medium  Result Value Ref Range Status   SARS Coronavirus 2 by RT PCR NEGATIVE NEGATIVE Final    Comment: (NOTE) SARS-CoV-2 target nucleic acids are NOT DETECTED.  The SARS-CoV-2 RNA is generally detectable in upper respiratory specimens during the acute phase of infection. The lowest concentration of SARS-CoV-2 viral copies this assay can detect is 138 copies/mL. A negative result does not preclude SARS-Cov-2 infection and should not be used as the sole basis for treatment or other patient management decisions. A negative result may occur with  improper specimen collection/handling, submission of specimen other than nasopharyngeal swab, presence of viral mutation(s) within the areas targeted by this assay, and inadequate number of viral copies(<138 copies/mL). A negative result must be combined with clinical observations, patient history, and epidemiological information. The expected result is Negative.  Fact Sheet for Patients:  EntrepreneurPulse.com.au  Fact Sheet for Healthcare Providers:  IncredibleEmployment.be  This test is no t yet approved or cleared by the [place] FDA and  has been authorized for detection and/or diagnosis of SARS-CoV-2 by FDA under an Emergency Use Authorization (EUA). This EUA will remain  in effect (meaning this test can be used) for the duration of the COVID-19 declaration under Section 564(b)(1) of the Act, 21 U.S.C.section 360bbb-3(b)(1), unless the authorization  is terminated  or revoked sooner.       Influenza A by PCR NEGATIVE NEGATIVE Final   Influenza B by PCR NEGATIVE NEGATIVE Final    Comment: (NOTE) The Xpert Xpress SARS-CoV-2/FLU/RSV plus assay is intended as an aid in the diagnosis of influenza from Nasopharyngeal swab specimens and should not be used as a sole basis for treatment. Nasal washings and aspirates are unacceptable for Xpert Xpress SARS-CoV-2/FLU/RSV testing.  Fact Sheet for Patients: EntrepreneurPulse.com.au  Fact Sheet for Healthcare Providers: IncredibleEmployment.be  This test is not yet approved or cleared by the [place]  States FDA and has been authorized for detection and/or diagnosis of SARS-CoV-2 by FDA under an Emergency Use Authorization (EUA). This EUA will remain in effect (meaning this test can be used) for the duration of the COVID-19 declaration under Section 564(b)(1) of the Act, 21 U.S.C. section 360bbb-3(b)(1), unless the authorization is terminated or revoked.  Performed at [facility], [place] [address], [place], [place] [address]      Labs: BNP (last 3 results) No results for input(s): BNP in the last 8760 hours. Basic Metabolic Panel: Recent Labs  Lab 06/15/20 0923 06/16/20 0419 06/17/20 0552 06/18/20 0758  NA 142 140 139 137  K 4.7 4.1 4.3 4.0  CL 110 111 110 106  CO2 23 21* 19* 19*  GLUCOSE 169* 100* 131* 133*  BUN 21 16 12 12   CREATININE 1.31* 1.15* 1.20* 1.13*  CALCIUM 8.8* 8.7* 8.7* 8.4*  MG  --   --  1.8 1.8  PHOS  --   --  3.6 3.4   Liver Function Tests: Recent Labs  Lab 06/15/20 0923 06/16/20 0419 06/17/20 0552 06/18/20 0758  AST 34 23 26 22   ALT 15 13 15 13   ALKPHOS 72 68 67 76  BILITOT 1.0 1.2 0.9 0.7  PROT 6.2* 5.4* 5.3* 5.5*  ALBUMIN 3.2* 3.0* 2.8* 2.9*   No results for input(s): LIPASE, AMYLASE in the last 168 hours. No results for input(s): AMMONIA in the last 168 hours. CBC: Recent Labs  Lab  06/14/20 1320 06/14/20 1320 06/15/20 0923 06/15/20 1558 06/17/20 0552 06/17/20 1029 06/17/20 1634 06/17/20 2223 06/18/20 0758  WBC 4.5  --  4.8  --  4.0  --   --   --  4.0  NEUTROABS 2.3  --  3.0  --  2.3  --   --   --  2.4  HGB 8.6 Repeated and verified X2.*   < > 8.4*   < > 8.8*  8.8* 9.0* 9.5* 9.9* 9.0*  HCT 27.4*   < > 28.6*   < > 29.4*  28.4* 29.9* 31.4* 32.5* 29.3*  MCV 92.4  --  98.6  --  98.0  --   --   --  98.3  PLT 119.0*  --  127*  --  106*  --   --   --  101*   < > = values in this interval not displayed.   Cardiac Enzymes: No results for input(s): CKTOTAL, CKMB, CKMBINDEX, TROPONINI in the last 168 hours. BNP: Invalid input(s): POCBNP CBG: Recent Labs  Lab 06/17/20 2109 06/18/20 0023 06/18/20 0416 06/18/20 0750 06/18/20 1207  GLUCAP 129* 131* 126* 119* 137*   D-Dimer No results for input(s): DDIMER in the last 72 hours. Hgb A1c No results for input(s): HGBA1C in the last 72 hours. Lipid Profile No results for input(s): CHOL, HDL, LDLCALC, TRIG, CHOLHDL, LDLDIRECT in the last 72 hours. Thyroid function studies No results for input(s): TSH, T4TOTAL, T3FREE, THYROIDAB in the last 72 hours.  Invalid input(s): FREET3 Anemia work up No results for input(s): VITAMINB12, FOLATE, FERRITIN, TIBC, IRON, RETICCTPCT in the last 72 hours. Urinalysis    Component Value Date/Time   COLORURINE YELLOW 04/29/2020 2000   APPEARANCEUR CLEAR 04/29/2020 2000   LABSPEC 1.011 04/29/2020 2000   PHURINE 6.0 04/29/2020 2000   GLUCOSEU NEGATIVE 04/29/2020 2000   HGBUR NEGATIVE 04/29/2020 [place] NEGATIVE 04/29/2020 2000   [place] Negative 12/04/2019 [place] 04/29/2020 2000   PROTEINUR NEGATIVE 04/29/2020 2000  UROBILINOGEN 0.2 12/04/2019 1352   UROBILINOGEN 0.2 02/07/2011 1832   NITRITE NEGATIVE 04/29/2020 2000   LEUKOCYTESUR 117 (A) 04/29/2020 2000   Sepsis Labs Invalid input(s): PROCALCITONIN,  WBC,   LACTICIDVEN Microbiology Recent Results (from the past 240 hour(s))  Resp Panel by RT-PCR (Flu A&B, Covid) Nasopharyngeal Swab     Status: None   Collection Time: 06/15/20 10:30 AM   Specimen: Nasopharyngeal Swab; Nasopharyngeal(NP) swabs in vial transport medium  Result Value Ref Range Status   SARS Coronavirus 2 by RT PCR NEGATIVE NEGATIVE Final    Comment: (NOTE) SARS-CoV-2 target nucleic acids are NOT DETECTED.  The SARS-CoV-2 RNA is generally detectable in upper respiratory specimens during the acute phase of infection. The lowest concentration of SARS-CoV-2 viral copies this assay can detect is 138 copies/mL. A negative result does not preclude SARS-Cov-2 infection and should not be used as the sole basis for treatment or other patient management decisions. A negative result may occur with  improper specimen collection/handling, submission of specimen other than nasopharyngeal swab, presence of viral mutation(s) within the areas targeted by this assay, and inadequate number of viral copies(<138 copies/mL). A negative result must be combined with clinical observations, patient history, and epidemiological information. The expected result is Negative.  Fact Sheet for Patients:  EntrepreneurPulse.com.au  Fact Sheet for Healthcare Providers:  IncredibleEmployment.be  This test is no t yet approved or cleared by the [place] FDA and  has been authorized for detection and/or diagnosis of SARS-CoV-2 by FDA under an Emergency Use Authorization (EUA). This EUA will remain  in effect (meaning this test can be used) for the duration of the COVID-19 declaration under Section 564(b)(1) of the Act, 21 U.S.C.section 360bbb-3(b)(1), unless the authorization is terminated  or revoked sooner.       Influenza A by PCR NEGATIVE NEGATIVE Final   Influenza B by PCR NEGATIVE NEGATIVE Final    Comment: (NOTE) The Xpert Xpress SARS-CoV-2/FLU/RSV plus  assay is intended as an aid in the diagnosis of influenza from Nasopharyngeal swab specimens and should not be used as a sole basis for treatment. Nasal washings and aspirates are unacceptable for Xpert Xpress SARS-CoV-2/FLU/RSV testing.  Fact Sheet for Patients: EntrepreneurPulse.com.au  Fact Sheet for Healthcare Providers: IncredibleEmployment.be  This test is not yet approved or cleared by the [place] FDA and has been authorized for detection and/or diagnosis of SARS-CoV-2 by FDA under an Emergency Use Authorization (EUA). This EUA will remain in effect (meaning this test can be used) for the duration of the COVID-19 declaration under Section 564(b)(1) of the Act, 21 U.S.C. section 360bbb-3(b)(1), unless the authorization is terminated or revoked.  Performed at [facility], [place] [address], [place], [place] [address]      Time coordinating discharge: 45 minutes  SIGNED:   [name], DO  [facility] 06/18/2020, 1:50 PM   If 7PM-7AM, please contact night-coverage [email or web address]

## 2020-06-18 NOTE — Progress Notes (Addendum)
Gastroenterology Inpatient Follow-up Note   PATIENT IDENTIFICATION  Cheryl Jordan is a 79 y.o. 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 The patient is hospitalized with acute on chronic anemia as well as recent bright red blood per rectum with plans for outpatient endoscopic evaluation now done as inpatient. Hospital Day: 4  SUBJECTIVE  Today the patient is feeling very well.  I printed out her EGD and colonoscopy reports and we went over them in detail.  She describes no progressive abdominal pain.  No fevers or chills.  She is hopeful that she can go home.  She had 1 watery bowel movement with a little bit of bright red blood on toilet paper but has not had any recurrence of that since late yesterday evening.  Hemoglobin improving on today's evaluation.   OBJECTIVE  Scheduled Inpatient Medications:  . amLODipine  2.5 mg Oral Daily  . insulin aspart  0-9 Units Subcutaneous TID WC  . irbesartan  75 mg Oral BID  . levothyroxine  88 mcg Oral QAC breakfast  . pantoprazole  40 mg Oral BID  . propranolol  40 mg Oral BID  . sucralfate  1 g Oral TID WC & HS   Continuous Inpatient Infusions:  . sodium chloride 10 mL/hr at 06/16/20 0643  . cefTRIAXone (ROCEPHIN)  IV 2 g (06/18/20 0541)   PRN Inpatient Medications: sodium chloride, acetaminophen **OR** acetaminophen, LORazepam, ondansetron **OR** ondansetron (ZOFRAN) IV, polyethylene glycol   Physical Examination  Temp:  [95.7 F (35.4 C)-98.5 F (36.9 C)] 97.7 F (36.5 C) (12/04 0536) Pulse Rate:  [65-77] 77 (12/04 0536) Resp:  [14-26] 14 (12/04 0536) BP: (123-168)/(50-67) 144/62 (12/04 0536) SpO2:  [94 %-100 %] 95 % (12/04 0536) Weight:  [95.7 kg] 95.7 kg (12/03 1059) Temp (24hrs), Avg:97.5 F  (36.4 C), Min:95.7 F (35.4 C), Max:98.5 F (36.9 C)  Weight: 95.7 kg GEN: No acute distress, appears chronically ill but nontoxic PSYCH: Cooperative, without pressured speech EYE: Conjunctivae pink, sclerae anicteric ENT: MMM CV: Nontachycardic RESP: Decreased breath sounds at the bases bilaterally but no audible wheezing or rales GI: Protuberant abdomen, NABS, soft, NT, without rebound or guarding MSK/EXT: Bilateral lower extremity edema present SKIN: No jaundice NEURO:  Alert & Oriented x 3, no focal deficits, no evidence of asterixis   Review of Data   Laboratory Studies   Recent Labs  Lab 06/17/20 0552  NA 139  K 4.3  CL 110  CO2 19*  BUN 12  CREATININE 1.20*  GLUCOSE 131*  CALCIUM 8.7*  MG 1.8  PHOS 3.6   Recent Labs  Lab 06/17/20 0552  AST 26  ALT 15  ALKPHOS 67    Recent Labs  Lab 06/14/20 1320 06/14/20 1320 06/15/20 0923 06/15/20 1558 06/17/20 0552 06/17/20 1029 06/17/20 2223  WBC 4.5   < > 4.8  --  4.0  --   --   HGB 8.6 Repeated and verified X2.*   < > 8.4*   < > 8.8*  8.8*   < > 9.9*  HCT 27.4*   < > 28.6*   < > 29.4*  28.4*   < > 32.5*  PLT 119.0*  --  127*  --  106*  --   --    < > = values in this interval not displayed.  Recent Labs  Lab 06/15/20 0923  INR 1.3*   MELD-Na score: 11 at 06/17/2020  5:52 AM MELD score: 11 at 06/17/2020  5:52 AM Calculated from: Serum Creatinine: 1.20 mg/dL at 06/17/2020  5:52 AM Serum Sodium: 139 mmol/L (Using max of 137 mmol/L) at 06/17/2020  5:52 AM Total Bilirubin: 0.9 mg/dL (Using min of 1 mg/dL) at 06/17/2020  5:52 AM INR(ratio): 1.3 at 06/15/2020  9:23 AM Age: 11 years  Imaging Studies  No new imaging studies to review   GI Procedures and Studies  EGD - No gross lesions in esophagus proximally. Grade I and grade II esophageal varices distally. - Portal hypertensive gastropathy. 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 - Biopsied stomach for HP  evaluation. - No gross lesions in the duodenal bulb, in the first portion of the duodenum and in the second portion of the duodenum. Biopsied.  Colonoscopy - Hemorrhoids found on digital rectal exam. - The examined portion of the ileum was normal. - Patent functional end-to-end ileo-colonic anastomosis, characterized by healthy appearing mucosa, an intact staple line and visible sutures. - Six 2 to 6 mm polyps in the rectum, at the recto-sigmoid colon, in the sigmoid colon, in the descending colon and in the transverse colon, removed with a cold snare. Resected and retrieved. - Erythematous mucosa in the mid rectum and in the distal rectum. Biopsied. - Normal mucosa in the entire examined colon otherwise. - Non-bleeding non-thrombosed external and internal hemorrhoids.   ASSESSMENT  Cheryl Jordan is a 79 y.o. 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 The patient is hospitalized with acute on chronic anemia as well as recent bright red blood per rectum with plans for outpatient endoscopic evaluation now done as inpatient.  The patient is hemodynamically and clinically stable.  At this time the patient seems to be doing well from an anemia perspective and is having increasing blood counts daily.  Treatment of the watermelon stomach yesterday should help fully help her chronic anemia and iron deficiency.  IV iron will be given to the patient today in effort of trying to optimize her and that can be considered again in the outpatient setting to hopefully prevent her from coming back in.  Would not necessarily plan repeat endoscopy unless patient has recurrent anemia at which point I would restart with an upper endoscopy and potential treatment of gave if it were  to be present.  High-dose PPI therapy will be continued as well as Carafate to decrease the risk of post interventional bleeding.  In regards to timing of restart of anticoagulation I would not restart any NOAC anticoagulation for at least a full 72 hours (12/7 in a.m.) to decrease her risk of having potential post interventional bleeding.  With that being said if it is felt that she is higher risk and needs heparin then can be initiated as an inpatient.  She has a liver lesion that has been growing in size and will need to be further characterized as an outpatient with contrasted CT.  I discussed with her and her son my concern that this could be an underlying hepatocellular carcinoma versus other lesion (she states it was previously biopsied years ago but it was much smaller).  This can be arranged as an outpatient.  Her rectal bleeding was most likely  a result of her hemorrhoids and she will go home on Anusol suppositories as well as sitz bath and Preparation H.  I hope to bulk her fiber with FiberCon.  We will arrange follow-up in clinic in the next 4 to 6 weeks.  From the standpoint of the patient's potential discharge I see no contraindication from a GI standpoint other than the question of timing of restart of anticoagulation based on medicines desire/need for reinitiation based on her medical other comorbidities.  All patient questions were answered to the best of my ability, and the patient agrees to the aforementioned plan of action with follow-up as indicated.  If the patient does stay in inpatient then would recommend obtaining a contrasted CT abdomen to better define the liver lesion.   PLAN/RECOMMENDATIONS  Continue p.o. PPI 40 mg twice daily indefinitely for now and may consider decreasing as outpatient Continue Carafate twice daily Follow-up pathology as outpatient IV iron infusion x1 dose in house and consider further as outpatient based on laboratory evaluation P.o. iron once daily to  initiate in 1 to 2 weeks post discharge Transition ceftriaxone to ciprofloxacin 500 twice daily for 3 days to complete Would hold NOAC anticoagulation for at least 72 hours (06/21/2020 first date) versus need for IV heparin as per medical service to decrease risk of post interventional bleeding Consider with inpatient medicine team ordering a MRI abdomen with contrast for CT abdomen with contrast to better define this growing lesion that has been seen back to 2019 Patient has been transitioned from atenolol to propranolol which is reasonable (try to titrate to heart rate less than 70 at rest) Anusol suppositories nightly x1 week and every other night times completion of 14 total suppositories Preparation H twice daily Sitz baths nightly FiberCon bulking fiber to be initiated daily   Please page/call with questions or concerns.   Justice Britain, MD [facility] Office # [phone]    LOS: 2 days  Irving Copas  06/18/2020, 8:05 AM

## 2020-06-18 NOTE — Progress Notes (Signed)
PT Cancellation Note  Patient Details Name: Cheryl Jordan MRN: [id number] DOB: [birth year]   Cancelled Treatment:    Reason Eval/Treat Not Completed: PT screened, no needs identified, will sign off. Pt has been getting up in her room without difficulty. PT observed pt donning non-skid socks and transferring to the chair I'ly.  Pt also not interested in PT and hoping to go home today.   Galen Manila 06/18/2020, 11:55 AM

## 2020-06-18 NOTE — Progress Notes (Signed)
OT Cancellation Note  Patient Details Name: Cheryl Jordan MRN: [id number] DOB: 1941-02-01   Cancelled Treatment:    Reason Eval/Treat Not Completed: OT screened, no needs identified, will sign off. Per PT patient ambulating in room without assistance and able to perform BADLs.   Lexine Jaspers L Apurva Reily 06/18/2020, 12:34 PM

## 2020-06-18 NOTE — Progress Notes (Signed)
Reviewed discharge paperwork with patient. Went over follow up appointments and medication regimen. Patient discharged in care of son and declined wheelchair escort.

## 2020-06-20 ENCOUNTER — Telehealth: Payer: Self-pay

## 2020-06-20 ENCOUNTER — Other Ambulatory Visit: Payer: Self-pay

## 2020-06-20 DIAGNOSIS — K7460 Unspecified cirrhosis of liver: Secondary | ICD-10-CM

## 2020-06-20 DIAGNOSIS — D509 Iron deficiency anemia, unspecified: Secondary | ICD-10-CM

## 2020-06-20 DIAGNOSIS — R188 Other ascites: Secondary | ICD-10-CM

## 2020-06-20 LAB — SURGICAL PATHOLOGY

## 2020-06-20 NOTE — Telephone Encounter (Signed)
Pt already has ov scheduled with Dr. Henrene Pastor 07/25/20@10 :40am. Lab orders in epic.

## 2020-06-20 NOTE — Telephone Encounter (Signed)
-----   Message from Irving Copas., MD sent at 06/18/2020  9:41 AM EST ----- Regarding: Follow-up JP,Patient admitted with acute on chronic anemia.  Has evidence of chronic liver disease as is expected with varices (now transition from atenolol to propranolol) as well as portal gastropathy as well as bleeding gave (status post RFA).  She also had colon polyps which were removed.  Pathology are pending.  We will update you when that returns.  She will receive IV iron while she is an inpatient.  May need another IV iron infusion as outpatient but may want to see how blood counts look in few weeks.  She has a liver lesion that you are aware of that needs follow-up.  She wants to leave the hospital and so further imaging will not be performed while she is here.  I think a contrasted CT is reasonable as she does not think she can lie flat for an MRI abdomen contrasted.  Will defer to you about whether you want to have that done prior to seeing you or one of the APPs back in clinic. Vaughan Basta, please schedule a follow-up with Dr. Henrene Pastor or Nevin Bloodgood or [place] in the next 4 to 6 weeks.  Would have the patient come in for a CMP/CBC/iron/TIBC/ferritin to be performed a few days before the clinic visit.Thanks. GM

## 2020-06-20 NOTE — Telephone Encounter (Signed)
Transition Care Management Follow-up Telephone Call  Date of discharge and from where: 06/18/20-[place]  How have you been since you were released from the hospital? Doing ok  Any questions or concerns? No  Items Reviewed:  Did the pt receive and understand the discharge instructions provided? Yes   Medications obtained and verified? Yes   Other? Yes   Any new allergies since your discharge? No   Dietary orders reviewed? Yes  Do you have support at home? Yes   Home Care and Equipment/Supplies: Were home health services ordered? no If so, what is the name of the agency? n/a  Has the agency set up a time to come to the patient's home? not applicable Were any new equipment or medical supplies ordered?  No What is the name of the medical supply agency? n/a Were you able to get the supplies/equipment? not applicable Do you have any questions related to the use of the equipment or supplies? No  Functional Questionnaire: (I = Independent and D = Dependent) ADLs: I  Bathing/Dressing- I  Meal Prep- I  Eating- I  Maintaining continence- I  Transferring/Ambulation- I  Managing Meds- I  Follow up appointments reviewed:   PCP Hospital f/u appt confirmed? Yes  Scheduled to see Raiford Noble on 06/23/20 @ 11:00.  [facility] f/u appt confirmed? Yes  Scheduled to see Dr. Henrene Pastor on 07/25/20 @ 10:40.  Are transportation arrangements needed? No   If their condition worsens, is the pt aware to call PCP or go to the Emergency Dept.? Yes  Was the patient provided with contact information for the PCP's office or ED? Yes  Was to pt encouraged to call back with questions or concerns? Yes

## 2020-06-21 ENCOUNTER — Encounter: Payer: Self-pay | Admitting: Gastroenterology

## 2020-06-21 ENCOUNTER — Encounter (HOSPITAL_COMMUNITY): Payer: Self-pay | Admitting: Gastroenterology

## 2020-06-23 ENCOUNTER — Other Ambulatory Visit: Payer: Self-pay

## 2020-06-23 ENCOUNTER — Ambulatory Visit (INDEPENDENT_AMBULATORY_CARE_PROVIDER_SITE_OTHER): Payer: Medicare Other | Admitting: Physician Assistant

## 2020-06-23 ENCOUNTER — Encounter: Payer: Self-pay | Admitting: Physician Assistant

## 2020-06-23 VITALS — BP 128/60 | HR 66 | Temp 98.2°F | Resp 17 | Ht 67.0 in | Wt 210.0 lb

## 2020-06-23 DIAGNOSIS — I8500 Esophageal varices without bleeding: Secondary | ICD-10-CM

## 2020-06-23 DIAGNOSIS — K922 Gastrointestinal hemorrhage, unspecified: Secondary | ICD-10-CM

## 2020-06-23 DIAGNOSIS — D500 Iron deficiency anemia secondary to blood loss (chronic): Secondary | ICD-10-CM | POA: Diagnosis not present

## 2020-06-23 DIAGNOSIS — I824Z9 Acute embolism and thrombosis of unspecified deep veins of unspecified distal lower extremity: Secondary | ICD-10-CM

## 2020-06-23 DIAGNOSIS — E1159 Type 2 diabetes mellitus with other circulatory complications: Secondary | ICD-10-CM

## 2020-06-23 DIAGNOSIS — I152 Hypertension secondary to endocrine disorders: Secondary | ICD-10-CM

## 2020-06-23 LAB — CBC WITH DIFFERENTIAL/PLATELET
Basophils Absolute: 0 10*3/uL (ref 0.0–0.1)
Basophils Relative: 0.7 % (ref 0.0–3.0)
Eosinophils Absolute: 0.1 10*3/uL (ref 0.0–0.7)
Eosinophils Relative: 1.5 % (ref 0.0–5.0)
HCT: 29.8 % — ABNORMAL LOW (ref 36.0–46.0)
Hemoglobin: 9.6 g/dL — ABNORMAL LOW (ref 12.0–15.0)
Lymphocytes Relative: 31.1 % (ref 12.0–46.0)
Lymphs Abs: 1.3 10*3/uL (ref 0.7–4.0)
MCHC: 32.1 g/dL (ref 30.0–36.0)
MCV: 93.8 fl (ref 78.0–100.0)
Monocytes Absolute: 0.2 10*3/uL (ref 0.1–1.0)
Monocytes Relative: 5.1 % (ref 3.0–12.0)
Neutro Abs: 2.5 10*3/uL (ref 1.4–7.7)
Neutrophils Relative %: 61.6 % (ref 43.0–77.0)
Platelets: 112 10*3/uL — ABNORMAL LOW (ref 150.0–400.0)
RBC: 3.18 Mil/uL — ABNORMAL LOW (ref 3.87–5.11)
RDW: 17.9 % — ABNORMAL HIGH (ref 11.5–15.5)
WBC: 4.1 10*3/uL (ref 4.0–10.5)

## 2020-06-23 LAB — COMPREHENSIVE METABOLIC PANEL WITH GFR
ALT: 12 U/L (ref 0–35)
AST: 27 U/L (ref 0–37)
Albumin: 3.3 g/dL — ABNORMAL LOW (ref 3.5–5.2)
Alkaline Phosphatase: 72 U/L (ref 39–117)
BUN: 20 mg/dL (ref 6–23)
CO2: 25 meq/L (ref 19–32)
Calcium: 8.5 mg/dL (ref 8.4–10.5)
Chloride: 109 meq/L (ref 96–112)
Creatinine, Ser: 1.25 mg/dL — ABNORMAL HIGH (ref 0.40–1.20)
GFR: 41.31 mL/min — ABNORMAL LOW
Glucose, Bld: 175 mg/dL — ABNORMAL HIGH (ref 70–99)
Potassium: 4.5 meq/L (ref 3.5–5.1)
Sodium: 139 meq/L (ref 135–145)
Total Bilirubin: 0.6 mg/dL (ref 0.2–1.2)
Total Protein: 5.7 g/dL — ABNORMAL LOW (ref 6.0–8.3)

## 2020-06-23 MED ORDER — PROPRANOLOL HCL 40 MG PO TABS: 40.0000 mg | ORAL_TABLET | Freq: Two times a day (BID) | ORAL | 1 refills | Status: DC

## 2020-06-23 NOTE — Progress Notes (Signed)
Patient presents to clinic today for TCM visit/Hospital Follow-up. Patient was sent to the ER on 06/15/2020 by this provider for symptomatic and worsening anemia. ER workup included repeat hemoglobin revealing yet again worsening anemia.  GI was consulted with admission recommended.  Patient was transfused 1 unit of packed red blood cells in the ER.  Admitted to the hospital same day.  During admission, patient underwent EGD/C-scope noting grade 1 and 2 esophageal varices distally and portal hypertensive gastropathy.  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  This was treated with RFA.  Biopsies obtained for H. pylori which were unremarkable.  No duodenal ulcers noted.  C-scope revealed rectal polyps that were removed.  Patient was continued on Carafate and pantoprazole during hospitalization.  IV iron given during hospitalization.  Hemoglobin stabilized.  As such patient felt safe to discharge home on 06/18/2020.  She was instructed to continue pantoprazole twice daily along with Carafate.  Anusol prescribed for mild hemorrhoids noted during C-scope.  Patient encouraged to continue propranolol twice daily for beta-blockade and to help with varices.  Patient to follow-up with GI outpatient for repeat assessment in 4 weeks and consideration of MRI versus CT of liver.  Since discharge, patient endorses taking all medications as directed.  Notes tolerating most of them well but the Carafate seems to make her quite nauseated.  Denies any recurrence of rectal bleeding.  Is taking her iron supplement daily.  Using Anusol as directed.  Denies any heartburn or indigestion.  Is hydrating better and keeping more food on her system.  Denies constipation or diarrhea.  Denies change to bladder habits.  Has follow-up scheduled with her gastroenterologist.  Denies any new concerns at today's visit.  Past Medical History:  Diagnosis Date  . Allergy   . Anemia    hx of  . Anxiety   .  Arthritis   . Colon cancer ([place]) dx'd 11/2014  . Diabetes mellitus without complication ([place])    TYPE II  . Fatty liver   . Gallbladder disease   . GERD (gastroesophageal reflux disease)   . History of gout   . Hypertension   . Hypothyroidism   . Kidney disorder    spot on left kidney  . Osteoporosis   . PONV (postoperative nausea and vomiting)     Current Outpatient Medications on File Prior to Visit  Medication Sig Dispense Refill  . ACCU-CHEK AVIVA PLUS test strip     . acetaminophen (TYLENOL) 500 MG tablet Take 500 mg by mouth every 6 (six) hours as needed for moderate pain.    Marland Kitchen amLODipine (NORVASC) 2.5 MG tablet Take 1 tablet (2.5 mg total) by mouth daily. 90 tablet 2  . Blood Glucose Monitoring Suppl (ACCU-CHEK AVIVA) device Use as instructed 1 each 0  . ferrous sulfate 325 (65 FE) MG EC tablet Take 1 tablet (325 mg total) by mouth 2 (two) times daily with a meal. (Patient taking differently: Take 325 mg by mouth daily. ) 90 tablet 1  . furosemide (LASIX) 40 MG tablet Take 1 tablet (40 mg total) by mouth daily. 30 tablet 6  . glipiZIDE (GLUCOTROL XL) 5 MG 24 hr tablet TAKE (1) TABLET DAILY WITH BREAKFAST. (Patient taking differently: Take 5 mg by mouth daily. ) 90 tablet 1  . hydrocortisone (ANUSOL-HC) 25 MG suppository Place 1 suppository (25 mg total) rectally at bedtime for 7 days, THEN 1 suppository (25 mg total) every other day for 14 days. 14 suppository  0  . hydrocortisone cream 1 % Apply topically 2 (two) times daily. 30 g 0  . levothyroxine (SYNTHROID) 88 MCG tablet Take 1 tablet (88 mcg total) by mouth daily. 90 tablet 2  . loperamide (IMODIUM) 2 MG capsule Take 2 capsules by mouth once. Then take additional $RemoveBeforeD'2mg'HYqAOONNeppoJf$  capsule as needed for continued diarrhea. No more than 16 mg in 24 hours. (Patient taking differently: Take 2 mg by mouth as needed for diarrhea or loose stools. Take 2 capsules by mouth once. Then take additional $RemoveBeforeD'2mg'VtNQEUraePCCXv$  capsule as needed for continued diarrhea. No  more than 16 mg in 24 hours.) 30 capsule 0  . LORazepam (ATIVAN) 0.5 MG tablet Take 1 tablet (0.5 mg total) by mouth 2 (two) times daily as needed for anxiety. 30 tablet 0  . omeprazole (PRILOSEC) 20 MG capsule Take 1 capsule (20 mg total) by mouth 2 (two) times daily. 60 capsule 0  . ondansetron (ZOFRAN) 8 MG tablet Take 1 tablet (8 mg total) by mouth every 8 (eight) hours as needed for nausea or vomiting. 20 tablet 0  . polycarbophil (FIBERCON) 625 MG tablet Take 1 tablet (625 mg total) by mouth daily. 30 tablet 0  . propranolol (INDERAL) 40 MG tablet Take 1 tablet (40 mg total) by mouth 2 (two) times daily. 60 tablet 0  . rivaroxaban (XARELTO) 20 MG TABS tablet Take 1 tablet (20 mg total) by mouth daily with supper. Resume 06/21/20 90 tablet 0  . spironolactone (ALDACTONE) 50 MG tablet Take 1 tablet (50 mg total) by mouth daily. 30 tablet 6  . sucralfate (CARAFATE) 1 g tablet Take 1 tablet (1 g total) by mouth 2 (two) times daily. 60 tablet 0  . telmisartan (MICARDIS) 40 MG tablet Take 0.5 tablets (20 mg total) by mouth in the morning and at bedtime.     No current facility-administered medications on file prior to visit.    Allergies  Allergen Reactions  . Aspirin Other (See Comments)    Runny nose   . Codeine Nausea And Vomiting    Family History  Problem Relation Age of Onset  . Diabetes Mother   . Hypertension Other   . COPD Father   . Hypertension Sister   . GER disease Sister   . Heart attack Brother   . COPD Brother   . Lung cancer Brother   . COPD Brother   . Hypertension Brother     Social History   Socioeconomic History  . Marital status: Married    Spouse name: Not on file  . Number of children: 2  . Years of education: Not on file  . Highest education level: Not on file  Occupational History  . Occupation: Retired  Tobacco Use  . Smoking status: Never Smoker  . Smokeless tobacco: Never Used  Vaping Use  . Vaping Use: Never used  Substance and Sexual  Activity  . Alcohol use: No  . Drug use: No  . Sexual activity: Never  Other Topics Concern  . Not on file  Social History Narrative   03/03/15-Married, husband Jeneen Rinks   #2 grown sons and #2 grand daughters   Social Determinants of Health   Financial Resource Strain: Low Risk   . Difficulty of Paying Living Expenses: Not hard at all  Food Insecurity: No Food Insecurity  . Worried About Charity fundraiser in the Last Year: Never true  . Ran Out of Food in the Last Year: Never true  Transportation Needs: No Transportation Needs  .  Lack of Transportation (Medical): No  . Lack of Transportation (Non-Medical): No  Physical Activity: Inactive  . Days of Exercise per Week: 0 days  . Minutes of Exercise per Session: 0 min  Stress: No Stress Concern Present  . Feeling of Stress : Not at all  Social Connections: Socially Integrated  . Frequency of Communication with Friends and Family: More than three times a week  . Frequency of Social Gatherings with Friends and Family: More than three times a week  . Attends Religious Services: More than 4 times per year  . Active Member of Clubs or Organizations: Yes  . Attends Banker Meetings: More than 4 times per year  . Marital Status: Married    Review of Systems - See HPI.  All other ROS are negative.  There were no vitals taken for this visit.  Physical Exam Vitals reviewed.  Constitutional:      Appearance: Normal appearance.  HENT:     Head: Normocephalic and atraumatic.     Right Ear: Tympanic membrane normal.     Left Ear: Tympanic membrane normal.  Eyes:     Conjunctiva/sclera: Conjunctivae normal.     Pupils: Pupils are equal, round, and reactive to light.  Cardiovascular:     Rate and Rhythm: Normal rate and regular rhythm.     Pulses: Normal pulses.     Heart sounds: Normal heart sounds.  Pulmonary:     Effort: Pulmonary effort is normal.     Breath sounds: Normal breath sounds.  Musculoskeletal:      Cervical back: Neck supple.  Neurological:     General: No focal deficit present.     Mental Status: She is alert and oriented to person, place, and time.  Psychiatric:        Mood and Affect: Mood normal.     Recent Results (from the past 2160 hour(s))  Pro b natriuretic peptide (BNP)     Status: None   Collection Time: 03/30/20  3:07 PM  Result Value Ref Range   NT-Pro BNP 250 0 - 738 pg/mL    Comment: The following cut-points have been suggested for the use of proBNP for the diagnostic evaluation of heart failure (HF) in patients with acute dyspnea: Modality                     Age           Optimal Cut                            (years)            Point ------------------------------------------------------ Diagnosis (rule in HF)        <50            450 pg/mL                           50 - 75            900 pg/mL                               >75           1800 pg/mL Exclusion (rule out HF)  Age independent     300 pg/mL   ECHOCARDIOGRAM COMPLETE     Status: None   Collection Time: 04/08/20  4:23 PM  Result Value Ref Range   Area-P 1/2 2.66 cm2   S' Lateral 3.00 cm   AV Area mean vel 1.79 cm2   AR max vel 1.77 cm2   AV Area VTI 2.11 cm2   Ao pk vel 2.17 m/s   AV Mean grad 11.0 mmHg   AV Peak grad 18.7 mmHg  Lipase, blood     Status: Abnormal   Collection Time: 04/29/20 10:06 AM  Result Value Ref Range   Lipase 95 (H) 11 - 51 U/L    Comment: Performed at [facility], [place] [address], [place], [place] [address]  Comprehensive metabolic panel     Status: Abnormal   Collection Time: 04/29/20 10:06 AM  Result Value Ref Range   Sodium 135 135 - 145 mmol/L   Potassium 4.0 3.5 - 5.1 mmol/L   Chloride 104 98 - 111 mmol/L   CO2 22 22 - 32 mmol/L   Glucose, Bld 200 (H) 70 - 99 mg/dL    Comment: Glucose reference range applies only to samples taken after fasting for at least 8 hours.   BUN 25 (H) 8 - 23 mg/dL   Creatinine, Ser 1.32 (H) 0.44 - 1.00 mg/dL    Calcium 8.7 (L) 8.9 - 10.3 mg/dL   Total Protein 6.2 (L) 6.5 - 8.1 g/dL   Albumin 3.1 (L) 3.5 - 5.0 g/dL   AST 30 15 - 41 U/L   ALT 20 0 - 44 U/L   Alkaline Phosphatase 90 38 - 126 U/L   Total Bilirubin 1.7 (H) 0.3 - 1.2 mg/dL   GFR, Estimated 39 (L) >60 mL/min   Anion gap 9 5 - 15    Comment: Performed at [facility], [place] [address] [place], [place] [address]  CBC     Status: Abnormal   Collection Time: 04/29/20 10:06 AM  Result Value Ref Range   WBC 6.8 4.0 - 10.5 K/uL   RBC 3.69 (L) 3.87 - 5.11 MIL/uL   Hemoglobin 11.2 (L) 12.0 - 15.0 g/dL   HCT 34.2 (L) 36.0 - 46.0 %   MCV 92.7 80.0 - 100.0 fL   MCH 30.4 26.0 - 34.0 pg   MCHC 32.7 30.0 - 36.0 g/dL   RDW 15.6 (H) 11.5 - 15.5 %   Platelets 100 (L) 150 - 400 K/uL    Comment: REPEATED TO VERIFY PLATELET COUNT CONFIRMED BY SMEAR SPECIMEN CHECKED FOR CLOTS Immature Platelet Fraction may be clinically indicated, consider ordering this additional test [id number]    nRBC 0.0 0.0 - 0.2 %    Comment: Performed at [facility], [place] [address], [place], [place] [address]  Hemoglobin A1c     Status: Abnormal   Collection Time: 04/29/20 10:06 AM  Result Value Ref Range   Hgb A1c MFr Bld 6.2 (H) 4.8 - 5.6 %    Comment: (NOTE)         Prediabetes: 5.7 - 6.4         Diabetes: >6.4         Glycemic control for adults with diabetes: <7.0    Mean Plasma Glucose 131 mg/dL    Comment: (NOTE) Performed At: [facility] [place], [place] [id number] Rush Farmer MD YY:[id number]   Respiratory Panel by RT PCR (Flu A&B, Covid) - Nasopharyngeal Swab     Status: None   Collection Time: 04/29/20 10:30 AM   Specimen: Nasopharyngeal Swab  Result Value Ref Range  SARS Coronavirus 2 by RT PCR NEGATIVE NEGATIVE    Comment: (NOTE) SARS-CoV-2 target nucleic acids are NOT DETECTED.  The SARS-CoV-2 RNA is generally detectable in upper respiratoy specimens during the acute phase of  infection. The lowest concentration of SARS-CoV-2 viral copies this assay can detect is 131 copies/mL. A negative result does not preclude SARS-Cov-2 infection and should not be used as the sole basis for treatment or other patient management decisions. A negative result may occur with  improper specimen collection/handling, submission of specimen other than nasopharyngeal swab, presence of viral mutation(s) within the areas targeted by this assay, and inadequate number of viral copies (<131 copies/mL). A negative result must be combined with clinical observations, patient history, and epidemiological information. The expected result is Negative.  Fact Sheet for Patients:  PinkCheek.be  Fact Sheet for Healthcare Providers:  GravelBags.it  This test is no t yet approved or cleared by the [place] FDA and  has been authorized for detection and/or diagnosis of SARS-CoV-2 by FDA under an Emergency Use Authorization (EUA). This EUA will remain  in effect (meaning this test can be used) for the duration of the COVID-19 declaration under Section 564(b)(1) of the Act, 21 U.S.C. section 360bbb-3(b)(1), unless the authorization is terminated or revoked sooner.     Influenza A by PCR NEGATIVE NEGATIVE   Influenza B by PCR NEGATIVE NEGATIVE    Comment: (NOTE) The Xpert Xpress SARS-CoV-2/FLU/RSV assay is intended as an aid in  the diagnosis of influenza from Nasopharyngeal swab specimens and  should not be used as a sole basis for treatment. Nasal washings and  aspirates are unacceptable for Xpert Xpress SARS-CoV-2/FLU/RSV  testing.  Fact Sheet for Patients: PinkCheek.be  Fact Sheet for Healthcare Providers: GravelBags.it  This test is not yet approved or cleared by the [place] FDA and  has been authorized for detection and/or diagnosis of SARS-CoV-2 by  FDA  under an Emergency Use Authorization (EUA). This EUA will remain  in effect (meaning this test can be used) for the duration of the  Covid-19 declaration under Section 564(b)(1) of the Act, 21  U.S.C. section 360bbb-3(b)(1), unless the authorization is  terminated or revoked. Performed at [facility], [place] [address], [place], [place] [address]   Glucose, capillary     Status: Abnormal   Collection Time: 04/29/20  5:00 PM  Result Value Ref Range   Glucose-Capillary 155 (H) 70 - 99 mg/dL    Comment: Glucose reference range applies only to samples taken after fasting for at least 8 hours.  Urinalysis, Routine w reflex microscopic Urine, Clean Catch     Status: Abnormal   Collection Time: 04/29/20  8:00 PM  Result Value Ref Range   Color, Urine YELLOW YELLOW   APPearance CLEAR CLEAR   Specific Gravity, Urine 1.011 1.005 - 1.030   pH 6.0 5.0 - 8.0   Glucose, UA NEGATIVE NEGATIVE mg/dL   Hgb urine dipstick NEGATIVE NEGATIVE   Bilirubin Urine NEGATIVE NEGATIVE   Ketones, ur NEGATIVE NEGATIVE mg/dL   Protein, ur NEGATIVE NEGATIVE mg/dL   Nitrite NEGATIVE NEGATIVE   Leukocytes,Ua 117 (A) NEGATIVE   RBC / HPF 0-5 0 - 5 RBC/hpf   WBC, UA 0-5 0 - 5 WBC/hpf   Bacteria, UA RARE (A) NONE SEEN   Squamous Epithelial / LPF 0-5 0 - 5    Comment: Performed at [facility], [place] [address] [place], [place] [address]  Glucose, capillary     Status:  Abnormal   Collection Time: 04/29/20  9:30 PM  Result Value Ref Range   Glucose-Capillary 125 (H) 70 - 99 mg/dL    Comment: Glucose reference range applies only to samples taken after fasting for at least 8 hours.  Comprehensive metabolic panel     Status: Abnormal   Collection Time: 04/30/20  4:06 AM  Result Value Ref Range   Sodium 135 135 - 145 mmol/L   Potassium 4.0 3.5 - 5.1 mmol/L   Chloride 107 98 - 111 mmol/L   CO2 19 (L) 22 - 32 mmol/L   Glucose, Bld 164 (H) 70 - 99 mg/dL    Comment: Glucose reference  range applies only to samples taken after fasting for at least 8 hours.   BUN 18 8 - 23 mg/dL   Creatinine, Ser 0.99 0.44 - 1.00 mg/dL   Calcium 7.9 (L) 8.9 - 10.3 mg/dL   Total Protein 5.2 (L) 6.5 - 8.1 g/dL   Albumin 2.5 (L) 3.5 - 5.0 g/dL   AST 24 15 - 41 U/L   ALT 17 0 - 44 U/L   Alkaline Phosphatase 72 38 - 126 U/L   Total Bilirubin 1.5 (H) 0.3 - 1.2 mg/dL   GFR, Estimated 55 (L) >60 mL/min   Anion gap 9 5 - 15    Comment: Performed at [facility], [place] [address] [place], [place] [address]  CBC     Status: Abnormal   Collection Time: 04/30/20  4:06 AM  Result Value Ref Range   WBC 5.3 4.0 - 10.5 K/uL   RBC 3.33 (L) 3.87 - 5.11 MIL/uL   Hemoglobin 9.9 (L) 12.0 - 15.0 g/dL   HCT 30.8 (L) 36.0 - 46.0 %   MCV 92.5 80.0 - 100.0 fL   MCH 29.7 26.0 - 34.0 pg   MCHC 32.1 30.0 - 36.0 g/dL   RDW 15.2 11.5 - 15.5 %   Platelets 83 (L) 150 - 400 K/uL    Comment: REPEATED TO VERIFY Immature Platelet Fraction may be clinically indicated, consider ordering this additional test [id number] CONSISTENT WITH PREVIOUS RESULT    nRBC 0.0 0.0 - 0.2 %    Comment: Performed at [facility], [place] [address], [name], [place] [address]  Glucose, capillary     Status: Abnormal   Collection Time: 04/30/20  7:46 AM  Result Value Ref Range   Glucose-Capillary 153 (H) 70 - 99 mg/dL    Comment: Glucose reference range applies only to samples taken after fasting for at least 8 hours.  Gastrointestinal Panel by PCR , Stool     Status: Abnormal   Collection Time: 04/30/20 10:31 AM   Specimen: Stool  Result Value Ref Range   Campylobacter species NOT DETECTED NOT DETECTED   Plesimonas shigelloides NOT DETECTED NOT DETECTED   Salmonella species NOT DETECTED NOT DETECTED   Yersinia enterocolitica NOT DETECTED NOT DETECTED   Vibrio species NOT DETECTED NOT DETECTED   Vibrio cholerae NOT DETECTED NOT DETECTED   Enteroaggregative E coli (EAEC) NOT DETECTED NOT DETECTED    Enteropathogenic E coli (EPEC) NOT DETECTED NOT DETECTED   Enterotoxigenic E coli (ETEC) NOT DETECTED NOT DETECTED   Shiga like toxin producing E coli (STEC) NOT DETECTED NOT DETECTED   Shigella/Enteroinvasive E coli (EIEC) NOT DETECTED NOT DETECTED   Cryptosporidium NOT DETECTED NOT DETECTED   Cyclospora cayetanensis NOT DETECTED NOT DETECTED   Entamoeba histolytica NOT DETECTED NOT DETECTED   Giardia lamblia NOT DETECTED NOT DETECTED  Adenovirus F40/41 NOT DETECTED NOT DETECTED   Astrovirus NOT DETECTED NOT DETECTED   Norovirus GI/GII DETECTED (A) NOT DETECTED    Comment: RESULT CALLED TO, READ BACK BY AND VERIFIED WITH: BRITANNY BORING AT 1627 05/01/20.PMF    Rotavirus A NOT DETECTED NOT DETECTED   Sapovirus (I, II, IV, and V) NOT DETECTED NOT DETECTED    Comment: Performed at [facility], [place] [place], [place] [address]  Glucose, capillary     Status: Abnormal   Collection Time: 04/30/20 12:08 PM  Result Value Ref Range   Glucose-Capillary 178 (H) 70 - 99 mg/dL    Comment: Glucose reference range applies only to samples taken after fasting for at least 8 hours.  Glucose, capillary     Status: Abnormal   Collection Time: 04/30/20  4:15 PM  Result Value Ref Range   Glucose-Capillary 132 (H) 70 - 99 mg/dL    Comment: Glucose reference range applies only to samples taken after fasting for at least 8 hours.  Glucose, capillary     Status: Abnormal   Collection Time: 04/30/20  8:53 PM  Result Value Ref Range   Glucose-Capillary 158 (H) 70 - 99 mg/dL    Comment: Glucose reference range applies only to samples taken after fasting for at least 8 hours.  Comprehensive metabolic panel     Status: Abnormal   Collection Time: 05/01/20  5:51 AM  Result Value Ref Range   Sodium 135 135 - 145 mmol/L   Potassium 3.9 3.5 - 5.1 mmol/L   Chloride 107 98 - 111 mmol/L   CO2 22 22 - 32 mmol/L   Glucose, Bld 159 (H) 70 - 99 mg/dL    Comment: Glucose reference range  applies only to samples taken after fasting for at least 8 hours.   BUN 15 8 - 23 mg/dL   Creatinine, Ser 1.06 (H) 0.44 - 1.00 mg/dL   Calcium 8.3 (L) 8.9 - 10.3 mg/dL   Total Protein 5.5 (L) 6.5 - 8.1 g/dL   Albumin 2.6 (L) 3.5 - 5.0 g/dL   AST 26 15 - 41 U/L   ALT 17 0 - 44 U/L   Alkaline Phosphatase 80 38 - 126 U/L   Total Bilirubin 1.2 0.3 - 1.2 mg/dL   GFR, Estimated 50 (L) >60 mL/min   Anion gap 6 5 - 15    Comment: Performed at [facility], [place] [address], [place],  [address]  Glucose, capillary     Status: Abnormal   Collection Time: 05/01/20  8:24 AM  Result Value Ref Range   Glucose-Capillary 166 (H) 70 - 99 mg/dL    Comment: Glucose reference range applies only to samples taken after fasting for at least 8 hours.  CBC with Differential/Platelet     Status: Abnormal   Collection Time: 05/01/20  8:33 AM  Result Value Ref Range   WBC 5.8 4.0 - 10.5 K/uL   RBC 3.49 (L) 3.87 - 5.11 MIL/uL   Hemoglobin 10.5 (L) 12.0 - 15.0 g/dL   HCT 32.0 (L) 36.0 - 46.0 %   MCV 91.7 80.0 - 100.0 fL   MCH 30.1 26.0 - 34.0 pg   MCHC 32.8 30.0 - 36.0 g/dL   RDW 15.1 11.5 - 15.5 %   Platelets 109 (L) 150 - 400 K/uL    Comment: Immature Platelet Fraction may be clinically indicated, consider ordering this additional test [id number] CONSISTENT WITH PREVIOUS RESULT    nRBC 0.0 0.0 -  0.2 %   Neutrophils Relative % 68 %   Neutro Abs 3.9 1.7 - 7.7 K/uL   Lymphocytes Relative 23 %   Lymphs Abs 1.3 0.7 - 4.0 K/uL   Monocytes Relative 7 %   Monocytes Absolute 0.4 0.1 - 1.0 K/uL   Eosinophils Relative 1 %   Eosinophils Absolute 0.0 0.0 - 0.5 K/uL   Basophils Relative 0 %   Basophils Absolute 0.0 0.0 - 0.1 K/uL   WBC Morphology TOXIC GRANULATION    Immature Granulocytes 1 %   Abs Immature Granulocytes 0.06 0.00 - 0.07 K/uL    Comment: Performed at [facility], [place] [address], [place], [place] [address]  Glucose, capillary     Status: Abnormal    Collection Time: 05/01/20 12:01 PM  Result Value Ref Range   Glucose-Capillary 174 (H) 70 - 99 mg/dL    Comment: Glucose reference range applies only to samples taken after fasting for at least 8 hours.  Glucose, capillary     Status: Abnormal   Collection Time: 05/01/20  4:30 PM  Result Value Ref Range   Glucose-Capillary 170 (H) 70 - 99 mg/dL    Comment: Glucose reference range applies only to samples taken after fasting for at least 8 hours.  Glucose, capillary     Status: Abnormal   Collection Time: 05/01/20  8:42 PM  Result Value Ref Range   Glucose-Capillary 150 (H) 70 - 99 mg/dL    Comment: Glucose reference range applies only to samples taken after fasting for at least 8 hours.  Glucose, capillary     Status: Abnormal   Collection Time: 05/02/20  7:33 AM  Result Value Ref Range   Glucose-Capillary 181 (H) 70 - 99 mg/dL    Comment: Glucose reference range applies only to samples taken after fasting for at least 8 hours.  Glucose, capillary     Status: Abnormal   Collection Time: 05/02/20 11:44 AM  Result Value Ref Range   Glucose-Capillary 153 (H) 70 - 99 mg/dL    Comment: Glucose reference range applies only to samples taken after fasting for at least 8 hours.  Basic metabolic panel     Status: Abnormal   Collection Time: 05/10/20  8:39 AM  Result Value Ref Range   Sodium 140 135 - 145 mmol/L   Potassium 4.4 3.5 - 5.1 mmol/L   Chloride 105 98 - 111 mmol/L   CO2 23 22 - 32 mmol/L   Glucose, Bld 163 (H) 70 - 99 mg/dL    Comment: Glucose reference range applies only to samples taken after fasting for at least 8 hours.   BUN 11 8 - 23 mg/dL   Creatinine, Ser 1.02 (H) 0.44 - 1.00 mg/dL   Calcium 8.6 (L) 8.9 - 10.3 mg/dL   GFR, Estimated 56 (L) >60 mL/min    Comment: (NOTE) Calculated using the CKD-EPI Creatinine Equation (2021)    Anion gap 12 5 - 15    Comment: Performed at [facility], [place] [address], [place], [place] [address]  CBC     Status:  Abnormal   Collection Time: 05/10/20  8:39 AM  Result Value Ref Range   WBC 7.7 4.0 - 10.5 K/uL   RBC 3.64 (L) 3.87 - 5.11 MIL/uL   Hemoglobin 10.5 (L) 12.0 - 15.0 g/dL   HCT 33.8 (L) 36.0 - 46.0 %   MCV 92.9 80.0 - 100.0 fL   MCH 28.8 26.0 - 34.0 pg   MCHC  31.1 30.0 - 36.0 g/dL   RDW 14.6 11.5 - 15.5 %   Platelets 141 (L) 150 - 400 K/uL   nRBC 0.0 0.0 - 0.2 %    Comment: Performed at [facility], [place] [address], [place], [place] [address]  Troponin I (High Sensitivity)     Status: None   Collection Time: 05/10/20  8:39 AM  Result Value Ref Range   Troponin I (High Sensitivity) 8 <18 ng/L    Comment: (NOTE) Elevated high sensitivity troponin I (hsTnI) values and significant  changes across serial measurements may suggest ACS but many other  chronic and acute conditions are known to elevate hsTnI results.  Refer to the "Links" section for chest pain algorithms and additional  guidance. Performed at [facility], [place] [address] [place], [place] [address]   D-dimer, quantitative (not at [facility])     Status: Abnormal   Collection Time: 05/10/20  8:39 AM  Result Value Ref Range   D-Dimer, Quant 4.16 (H) 0.00 - 0.50 ug/mL-FEU    Comment: (NOTE) At the manufacturer cut-off value of 0.5 g/mL FEU, this assay has a negative predictive value of 95-100%.This assay is intended for use in conjunction with a clinical pretest probability (PTP) assessment model to exclude pulmonary embolism (PE) and deep venous thrombosis (DVT) in outpatients suspected of PE or DVT. Results should be correlated with clinical presentation. Performed at [facility], [place] [address], [place], [place] [address]   Troponin I (High Sensitivity)     Status: None   Collection Time: 05/10/20 11:01 AM  Result Value Ref Range   Troponin I (High Sensitivity) 8 <18 ng/L    Comment: (NOTE) Elevated high sensitivity troponin I (hsTnI) values and significant   changes across serial measurements may suggest ACS but many other  chronic and acute conditions are known to elevate hsTnI results.  Refer to the "Links" section for chest pain algorithms and additional  guidance. Performed at [facility], [place] [address], [place], [place] [address]   Respiratory Panel by RT PCR (Flu A&B, Covid) - Nasopharyngeal Swab     Status: None   Collection Time: 05/10/20 12:52 PM   Specimen: Nasopharyngeal Swab  Result Value Ref Range   SARS Coronavirus 2 by RT PCR NEGATIVE NEGATIVE    Comment: (NOTE) SARS-CoV-2 target nucleic acids are NOT DETECTED.  The SARS-CoV-2 RNA is generally detectable in upper respiratoy specimens during the acute phase of infection. The lowest concentration of SARS-CoV-2 viral copies this assay can detect is 131 copies/mL. A negative result does not preclude SARS-Cov-2 infection and should not be used as the sole basis for treatment or other patient management decisions. A negative result may occur with  improper specimen collection/handling, submission of specimen other than nasopharyngeal swab, presence of viral mutation(s) within the areas targeted by this assay, and inadequate number of viral copies (<131 copies/mL). A negative result must be combined with clinical observations, patient history, and epidemiological information. The expected result is Negative.  Fact Sheet for Patients:  PinkCheek.be  Fact Sheet for Healthcare Providers:  GravelBags.it  This test is no t yet approved or cleared by the [place] FDA and  has been authorized for detection and/or diagnosis of SARS-CoV-2 by FDA under an Emergency Use Authorization (EUA). This EUA will remain  in effect (meaning this test can be used) for the duration of the COVID-19 declaration under Section 564(b)(1) of the Act, 21 U.S.C. section 360bbb-3(b)(1), unless the  authorization is  terminated or revoked sooner.     Influenza A by PCR NEGATIVE NEGATIVE   Influenza B by PCR NEGATIVE NEGATIVE    Comment: (NOTE) The Xpert Xpress SARS-CoV-2/FLU/RSV assay is intended as an aid in  the diagnosis of influenza from Nasopharyngeal swab specimens and  should not be used as a sole basis for treatment. Nasal washings and  aspirates are unacceptable for Xpert Xpress SARS-CoV-2/FLU/RSV  testing.  Fact Sheet for Patients: PinkCheek.be  Fact Sheet for Healthcare Providers: GravelBags.it  This test is not yet approved or cleared by the [place] FDA and  has been authorized for detection and/or diagnosis of SARS-CoV-2 by  FDA under an Emergency Use Authorization (EUA). This EUA will remain  in effect (meaning this test can be used) for the duration of the  Covid-19 declaration under Section 564(b)(1) of the Act, 21  U.S.C. section 360bbb-3(b)(1), unless the authorization is  terminated or revoked. Performed at [facility], [place] [address] [place], [place] [address]   POC occult blood, ED     Status: Abnormal   Collection Time: 05/10/20  4:12 PM  Result Value Ref Range   Fecal Occult Bld POSITIVE (A) NEGATIVE  Basic metabolic panel     Status: Abnormal   Collection Time: 05/11/20  4:22 AM  Result Value Ref Range   Sodium 137 135 - 145 mmol/L   Potassium 4.9 3.5 - 5.1 mmol/L   Chloride 103 98 - 111 mmol/L   CO2 21 (L) 22 - 32 mmol/L   Glucose, Bld 235 (H) 70 - 99 mg/dL    Comment: Glucose reference range applies only to samples taken after fasting for at least 8 hours.   BUN 14 8 - 23 mg/dL   Creatinine, Ser 1.06 (H) 0.44 - 1.00 mg/dL   Calcium 8.7 (L) 8.9 - 10.3 mg/dL   GFR, Estimated 54 (L) >60 mL/min    Comment: (NOTE) Calculated using the CKD-EPI Creatinine Equation (2021)    Anion gap 13 5 - 15    Comment: Performed at [facility], [place] [address],  [place], [place] [address]  Triglycerides, Body Fluid     Status: None   Collection Time: 05/11/20  1:17 PM  Result Value Ref Range   Triglycerides, Fluid 21 Not Estab. mg/dL    Comment: (NOTE) The reference intervals and other method performance specifications have not been established for this test. The test result should be integrated into the clinical context for interpretation. The reference interval(s) and other method performance specifications have not been established for this body fluid. The test result must be integrated into the clinical context for interpretation. Performed At: [facility] [place], [place] [id number] Rush Farmer MD [id number]    Fluid Type-FTRIG PERITONEAL     Comment: Performed at [place] [address] [place],  [address] CORRECTED ON 10/27 AT 4097: PREVIOUSLY REPORTED AS CYTO MISC   Body fluid cell count with differential     Status: Abnormal   Collection Time: 05/11/20  1:17 PM  Result Value Ref Range   Fluid Type-FCT PERITONEAL     Comment: CORRECTED ON 10/27 AT 1329: PREVIOUSLY REPORTED AS CYTO MISC   Color, Fluid YELLOW YELLOW   Appearance, Fluid CLEAR CLEAR   Total Nucleated Cell Count, Fluid 332 0 - 1,000 cu mm   Neutrophil Count, Fluid 29 (H) 0 - 25 %   Lymphs, Fluid 44 %   Monocyte-Macrophage-Serous Fluid  27 (L) 50 - 90 %   Eos, Fluid 0 %   Other Cells, Fluid CORRELATE WITH CYTOLOGY. %    Comment: Performed at [facility], [place] [address], [place], [place] [address]  Protein, pleural or peritoneal fluid     Status: None   Collection Time: 05/11/20  1:17 PM  Result Value Ref Range   Total protein, fluid <3.0 g/dL    Comment: (NOTE) No normal range established for this test Results should be evaluated in conjunction with serum values    Fluid Type-FTP PERITONEAL     Comment: Performed at [facility], [place] [address] [place], [place]  [address] CORRECTED ON 10/27 AT 1329: PREVIOUSLY REPORTED AS CYTO MISC   PH, Body Fluid     Status: None   Collection Time: 05/11/20  1:17 PM  Result Value Ref Range   pH, Body Fluid 7.4 Not Estab.    Comment: (NOTE) This test was developed and its performance characteristics determined by [facility]. It has not been cleared or approved by the Food and Drug Administration. The reference interval(s) and other method performance specifications have not been established for this body fluid. The test result must be integrated into the clinical context for interpretation. Performed At: [facility] [place], [place] [id number] Rush Farmer MD [id number]    Source PERITONEAL     Comment: Performed at [place] [address], [place], [place] [address]  Total bilirubin, body fluid     Status: None   Collection Time: 05/11/20  1:17 PM  Result Value Ref Range   Total bilirubin, fluid WRLTPR mg/dL    Comment: (NOTE) Test not performed. Specimen was not adequately protected from light. Amber colored tubes for light protection are available from [employer]. was notified 05/13/2020. Performed At: [facility] [address] [place], [place] [id number] Hilton Sinclair I MD [id number]   Cytology - Non PAP;     Status: None   Collection Time: 05/11/20  1:17 PM  Result Value Ref Range   CYTOLOGY - NON GYN      CYTOLOGY - NON PAP CASE: [id number] [name] Non-Gynecological Cytology Report     Clinical History: Hx colon cancer Specimen Submitted:  A. ASCITES, PARACENTESIS:   FINAL MICROSCOPIC DIAGNOSIS: - Reactive mesothelial cells present - No malignant cells identified  SPECIMEN ADEQUACY: Satisfactory for evaluation  DIAGNOSTIC COMMENTS: Inflammation present.  GROSS: Received is/are 1000 ccs of amber fluid. (CM:cm) Prepared: Smears:  0 Concentration method (Thin Prep):  1 Cell block:   1 Additional studies:  Also included is 1 Hematology slide labeled [id number].      Final Diagnosis performed by Claudette Laws, MD.   Electronically signed 05/12/2020 Technical component performed at [facility], [place] [address], [place], [place] [address].  Professional component performed at [employer]. [facility], [place] [address] [place], [place] [address].  Immunohistochemistry Technical component (if applicable) was performed at [employer]. [address] [place], [place], [place] [address].   IMMUNOHISTOCHEMISTRY DISCLAIMER (if applicable): Some of these immunohistochemical stains may have been developed and the performance characteristics determine by [facility]. Some may not have been cleared or approved by the U.S. Food and Drug Administration. The FDA has determined that such clearance or approval is not necessary. This test is used for clinical purposes. It should not be regarded as investigational or  for research. This laboratory is certified under the [place] (CLIA-88) as qualified to perform high complexity clinical laboratory testing.  The controls stained appropriately.   Aerobic/Anaerobic Culture (surgical/deep wound)     Status: None   Collection Time: 05/11/20  1:18 PM   Specimen: PATH Cytology Misc. fluid; Other  Result Value Ref Range   Specimen Description      PERITONEAL Performed at [place] [address], [place], [place] [address]    Special Requests      NONE Performed at [facility], [place] [address] [place], [place] [address]    Gram Stain NO WBC SEEN NO ORGANISMS SEEN CYTOSPIN SMEAR     Culture      No growth aerobically or anaerobically. Performed at [facility], [place] [address] [place], [place] [address]    Report Status 05/16/2020 FINAL   Comprehensive metabolic panel     Status: Abnormal    Collection Time: 05/12/20  3:39 AM  Result Value Ref Range   Sodium 138 135 - 145 mmol/L   Potassium 4.0 3.5 - 5.1 mmol/L    Comment: DELTA CHECK NOTED NO VISIBLE HEMOLYSIS    Chloride 105 98 - 111 mmol/L   CO2 23 22 - 32 mmol/L   Glucose, Bld 216 (H) 70 - 99 mg/dL    Comment: Glucose reference range applies only to samples taken after fasting for at least 8 hours.   BUN 25 (H) 8 - 23 mg/dL   Creatinine, Ser 1.15 (H) 0.44 - 1.00 mg/dL   Calcium 8.6 (L) 8.9 - 10.3 mg/dL   Total Protein 5.7 (L) 6.5 - 8.1 g/dL   Albumin 2.5 (L) 3.5 - 5.0 g/dL   AST 27 15 - 41 U/L   ALT 20 0 - 44 U/L   Alkaline Phosphatase 80 38 - 126 U/L   Total Bilirubin 1.2 0.3 - 1.2 mg/dL   GFR, Estimated 49 (L) >60 mL/min    Comment: (NOTE) Calculated using the CKD-EPI Creatinine Equation (2021)    Anion gap 10 5 - 15    Comment: Performed at [facility], [place] [address] [place], [place] [address]  Magnesium     Status: None   Collection Time: 05/12/20  3:39 AM  Result Value Ref Range   Magnesium 1.9 1.7 - 2.4 mg/dL    Comment: Performed at [facility], [place] [address] [place], [place] [address]  Phosphorus     Status: None   Collection Time: 05/12/20  3:39 AM  Result Value Ref Range   Phosphorus 3.6 2.5 - 4.6 mg/dL    Comment: Performed at [facility], [place] [address], [place], [place] [address]  CBC with Differential/Platelet     Status: Abnormal   Collection Time: 05/12/20  3:39 AM  Result Value Ref Range   WBC 9.3 4.0 - 10.5 K/uL   RBC 3.50 (L) 3.87 - 5.11 MIL/uL   Hemoglobin 10.3 (L) 12.0 - 15.0 g/dL   HCT 32.6 (L) 36.0 - 46.0 %   MCV 93.1 80.0 - 100.0 fL   MCH 29.4 26.0 - 34.0 pg   MCHC 31.6 30.0 - 36.0 g/dL   RDW 14.3 11.5 - 15.5 %   Platelets 145 (L) 150 - 400 K/uL   nRBC 0.0 0.0 - 0.2 %   Neutrophils Relative % 84 %   Neutro Abs 7.8 (H) 1.7 - 7.7 K/uL   Lymphocytes  Relative 12 %   Lymphs Abs 1.1 0.7 - 4.0 K/uL   Monocytes  Relative 4 %   Monocytes Absolute 0.4 0.1 - 1.0 K/uL   Eosinophils Relative 0 %   Eosinophils Absolute 0.0 0.0 - 0.5 K/uL   Basophils Relative 0 %   Basophils Absolute 0.0 0.0 - 0.1 K/uL   Immature Granulocytes 0 %   Abs Immature Granulocytes 0.02 0.00 - 0.07 K/uL    Comment: Performed at [facility], [place] [address], [place], [place] [address]  Protime-INR     Status: Abnormal   Collection Time: 05/25/20  4:35 PM  Result Value Ref Range   INR 3.0 (H) 0.8 - 1.0 ratio   Prothrombin Time 33.2 (H) 9.6 - 13.1 sec  CBC with Differential/Platelet     Status: Abnormal   Collection Time: 05/25/20  4:35 PM  Result Value Ref Range   WBC 6.9 4.0 - 10.5 K/uL   RBC 3.97 3.87 - 5.11 Mil/uL   Hemoglobin 11.6 (L) 12.0 - 15.0 g/dL   HCT 35.5 (L) 36.0 - 46.0 %   MCV 89.5 78.0 - 100.0 fl   MCHC 32.6 30.0 - 36.0 g/dL   RDW 16.1 (H) 11.5 - 15.5 %   Platelets 130.0 (L) 150.0 - 400.0 K/uL   Neutrophils Relative % 58.1 43.0 - 77.0 %   Lymphocytes Relative 35.1 12.0 - 46.0 %   Monocytes Relative 5.1 3.0 - 12.0 %   Eosinophils Relative 0.9 0.0 - 5.0 %   Basophils Relative 0.8 0.0 - 3.0 %   Neutro Abs 4.0 1.4 - 7.7 K/uL   Lymphs Abs 2.4 0.7 - 4.0 K/uL   Monocytes Absolute 0.4 0.1 - 1.0 K/uL   Eosinophils Absolute 0.1 0.0 - 0.7 K/uL   Basophils Absolute 0.1 0.0 - 0.1 K/uL  Comprehensive metabolic panel     Status: Abnormal   Collection Time: 05/25/20  4:35 PM  Result Value Ref Range   Sodium 141 135 - 145 mEq/L   Potassium 4.4 3.5 - 5.1 mEq/L   Chloride 106 96 - 112 mEq/L   CO2 28 19 - 32 mEq/L   Glucose, Bld 92 70 - 99 mg/dL   BUN 14 6 - 23 mg/dL   Creatinine, Ser 1.19 0.40 - 1.20 mg/dL   Total Bilirubin 0.9 0.2 - 1.2 mg/dL   Alkaline Phosphatase 110 39 - 117 U/L   AST 27 0 - 37 U/L   ALT 15 0 - 35 U/L   Total Protein 6.2 6.0 - 8.3 g/dL   Albumin 3.3 (L) 3.5 - 5.2 g/dL   GFR 43.84 (L) >60.00 mL/min    Comment: Calculated using the CKD-EPI Creatinine Equation (2021)    Calcium 8.9 8.4 - 10.5 mg/dL  CBC w/Diff     Status: Abnormal   Collection Time: 06/06/20 11:39 AM  Result Value Ref Range   WBC 4.3 4.0 - 10.5 K/uL   RBC 3.13 (L) 3.87 - 5.11 Mil/uL   Hemoglobin 9.3 (L) 12.0 - 15.0 g/dL   HCT 28.6 (L) 36.0 - 46.0 %   MCV 91.3 78.0 - 100.0 fl   MCHC 32.5 30.0 - 36.0 g/dL   RDW 17.4 (H) 11.5 - 15.5 %   Platelets 121.0 (L) 150.0 - 400.0 K/uL   Neutrophils Relative % 57.0 43.0 - 77.0 %   Lymphocytes Relative 35.4 12.0 - 46.0 %   Monocytes Relative 6.1 3.0 - 12.0 %   Eosinophils Relative 1.0 0.0 - 5.0 %  Basophils Relative 0.5 0.0 - 3.0 %   Neutro Abs 2.4 1.4 - 7.7 K/uL   Lymphs Abs 1.5 0.7 - 4.0 K/uL   Monocytes Absolute 0.3 0.1 - 1.0 K/uL   Eosinophils Absolute 0.0 0.0 - 0.7 K/uL   Basophils Absolute 0.0 0.0 - 0.1 K/uL  Basic metabolic panel     Status: Abnormal   Collection Time: 06/06/20 11:39 AM  Result Value Ref Range   Sodium 139 135 - 145 mEq/L   Potassium 4.5 3.5 - 5.1 mEq/L   Chloride 104 96 - 112 mEq/L   CO2 28 19 - 32 mEq/L   Glucose, Bld 184 (H) 70 - 99 mg/dL   BUN 19 6 - 23 mg/dL   Creatinine, Ser 1.60 (H) 0.40 - 1.20 mg/dL   GFR 30.73 (L) >60.00 mL/min    Comment: Calculated using the CKD-EPI Creatinine Equation (2021)   Calcium 8.5 8.4 - 10.5 mg/dL  CBC w/Diff     Status: Abnormal   Collection Time: 06/14/20  1:20 PM  Result Value Ref Range   WBC 4.5 4.0 - 10.5 K/uL   RBC 2.97 (L) 3.87 - 5.11 Mil/uL   Hemoglobin 8.6 Repeated and verified X2. (L) 12.0 - 15.0 g/dL   HCT 27.4 (L) 36.0 - 46.0 %   MCV 92.4 78.0 - 100.0 fl   MCHC 31.5 30.0 - 36.0 g/dL   RDW 18.6 (H) 11.5 - 15.5 %   Platelets 119.0 (L) 150.0 - 400.0 K/uL   Neutrophils Relative % 50.7 43.0 - 77.0 %   Lymphocytes Relative 41.8 12.0 - 46.0 %   Monocytes Relative 6.5 3.0 - 12.0 %   Eosinophils Relative 0.5 0.0 - 5.0 %   Basophils Relative 0.5 0.0 - 3.0 %   Neutro Abs 2.3 1.4 - 7.7 K/uL   Lymphs Abs 1.9 0.7 - 4.0 K/uL   Monocytes Absolute 0.3 0.1 - 1.0 K/uL    Eosinophils Absolute 0.0 0.0 - 0.7 K/uL   Basophils Absolute 0.0 0.0 - 0.1 K/uL  CBC with Differential     Status: Abnormal   Collection Time: 06/15/20  9:23 AM  Result Value Ref Range   WBC 4.8 4.0 - 10.5 K/uL   RBC 2.90 (L) 3.87 - 5.11 MIL/uL   Hemoglobin 8.4 (L) 12.0 - 15.0 g/dL   HCT 28.6 (L) 36.0 - 46.0 %   MCV 98.6 80.0 - 100.0 fL   MCH 29.0 26.0 - 34.0 pg   MCHC 29.4 (L) 30.0 - 36.0 g/dL   RDW 17.2 (H) 11.5 - 15.5 %   Platelets 127 (L) 150 - 400 K/uL   nRBC 0.0 0.0 - 0.2 %   Neutrophils Relative % 62 %   Neutro Abs 3.0 1.7 - 7.7 K/uL   Lymphocytes Relative 31 %   Lymphs Abs 1.5 0.7 - 4.0 K/uL   Monocytes Relative 6 %   Monocytes Absolute 0.3 0.1 - 1.0 K/uL   Eosinophils Relative 1 %   Eosinophils Absolute 0.0 0.0 - 0.5 K/uL   Basophils Relative 0 %   Basophils Absolute 0.0 0.0 - 0.1 K/uL   Immature Granulocytes 0 %   Abs Immature Granulocytes 0.01 0.00 - 0.07 K/uL    Comment: Performed at [facility], [place] [address] [place], [place] [address]  Comprehensive metabolic panel     Status: Abnormal   Collection Time: 06/15/20  9:23 AM  Result Value Ref Range   Sodium 142 135 - 145 mmol/L   Potassium 4.7  3.5 - 5.1 mmol/L   Chloride 110 98 - 111 mmol/L   CO2 23 22 - 32 mmol/L   Glucose, Bld 169 (H) 70 - 99 mg/dL    Comment: Glucose reference range applies only to samples taken after fasting for at least 8 hours.   BUN 21 8 - 23 mg/dL   Creatinine, Ser 1.31 (H) 0.44 - 1.00 mg/dL   Calcium 8.8 (L) 8.9 - 10.3 mg/dL   Total Protein 6.2 (L) 6.5 - 8.1 g/dL   Albumin 3.2 (L) 3.5 - 5.0 g/dL   AST 34 15 - 41 U/L   ALT 15 0 - 44 U/L   Alkaline Phosphatase 72 38 - 126 U/L   Total Bilirubin 1.0 0.3 - 1.2 mg/dL   GFR, Estimated 42 (L) >60 mL/min    Comment: (NOTE) Calculated using the CKD-EPI Creatinine Equation (2021)    Anion gap 9 5 - 15    Comment: Performed at [facility], [place] [address] [place], [place] [address]  Protime-INR      Status: Abnormal   Collection Time: 06/15/20  9:23 AM  Result Value Ref Range   Prothrombin Time 15.4 (H) 11.4 - 15.2 seconds   INR 1.3 (H) 0.8 - 1.2    Comment: (NOTE) INR goal varies based on device and disease states. Performed at [facility], [place] [address], [place], [place] [address]   Type and screen     Status: None   Collection Time: 06/15/20  9:23 AM  Result Value Ref Range   ABO/RH(D) A POS    Antibody Screen NEG    Sample Expiration 06/18/2020,2359    Unit Number [id number]    Blood Component Type RED CELLS,LR    Unit division 00    Status of Unit ISSUED,FINAL    Transfusion Status OK TO TRANSFUSE    Crossmatch Result      Compatible Performed at [place] [address] [place], [place] [address]   Prepare RBC (crossmatch)     Status: None   Collection Time: 06/15/20  9:23 AM  Result Value Ref Range   Order Confirmation      ORDER PROCESSED BY BLOOD BANK Performed at [facility], [place] [address], [place], [place] [address]   Troponin I (High Sensitivity)     Status: None   Collection Time: 06/15/20  9:23 AM  Result Value Ref Range   Troponin I (High Sensitivity) 4 <18 ng/L    Comment: (NOTE) Elevated high sensitivity troponin I (hsTnI) values and significant  changes across serial measurements may suggest ACS but many other  chronic and acute conditions are known to elevate hsTnI results.  Refer to the "Links" section for chest pain algorithms and additional  guidance. Performed at [facility], [place] [address], [place], [place] [address]   BPAM RBC     Status: None   Collection Time: 06/15/20  9:23 AM  Result Value Ref Range   ISSUE DATE / TIME [id number]    Blood Product Unit Number [id number]    PRODUCT CODE [id number]    Unit Type and Rh 6200    Blood Product Expiration Date 060156153794   AFP tumor marker     Status: None   Collection Time: 06/15/20  9:23 AM   Result Value Ref Range   AFP, Serum, Tumor Marker 3.6 0.0 - 8.3 ng/mL    Comment: (NOTE) Roche [employer]ostics Electrochemiluminescence Immunoassay (ECLIA) Values obtained with different  assay methods or kits cannot be used interchangeably.  Results cannot be interpreted as absolute evidence of the presence or absence of malignant disease. This test is not interpretable in pregnant females. Performed At: [facility] [place], [place] [id number] Rush Farmer MD [id number]   Resp Panel by RT-PCR (Flu A&B, Covid) Nasopharyngeal Swab     Status: None   Collection Time: 06/15/20 10:30 AM   Specimen: Nasopharyngeal Swab; Nasopharyngeal(NP) swabs in vial transport medium  Result Value Ref Range   SARS Coronavirus 2 by RT PCR NEGATIVE NEGATIVE    Comment: (NOTE) SARS-CoV-2 target nucleic acids are NOT DETECTED.  The SARS-CoV-2 RNA is generally detectable in upper respiratory specimens during the acute phase of infection. The lowest concentration of SARS-CoV-2 viral copies this assay can detect is 138 copies/mL. A negative result does not preclude SARS-Cov-2 infection and should not be used as the sole basis for treatment or other patient management decisions. A negative result may occur with  improper specimen collection/handling, submission of specimen other than nasopharyngeal swab, presence of viral mutation(s) within the areas targeted by this assay, and inadequate number of viral copies(<138 copies/mL). A negative result must be combined with clinical observations, patient history, and epidemiological information. The expected result is Negative.  Fact Sheet for Patients:  EntrepreneurPulse.com.au  Fact Sheet for Healthcare Providers:  IncredibleEmployment.be  This test is no t yet approved or cleared by the [place] FDA and  has been authorized for detection and/or diagnosis of SARS-CoV-2 by FDA under an  Emergency Use Authorization (EUA). This EUA will remain  in effect (meaning this test can be used) for the duration of the COVID-19 declaration under Section 564(b)(1) of the Act, 21 U.S.C.section 360bbb-3(b)(1), unless the authorization is terminated  or revoked sooner.       Influenza A by PCR NEGATIVE NEGATIVE   Influenza B by PCR NEGATIVE NEGATIVE    Comment: (NOTE) The Xpert Xpress SARS-CoV-2/FLU/RSV plus assay is intended as an aid in the diagnosis of influenza from Nasopharyngeal swab specimens and should not be used as a sole basis for treatment. Nasal washings and aspirates are unacceptable for Xpert Xpress SARS-CoV-2/FLU/RSV testing.  Fact Sheet for Patients: EntrepreneurPulse.com.au  Fact Sheet for Healthcare Providers: IncredibleEmployment.be  This test is not yet approved or cleared by the [place] FDA and has been authorized for detection and/or diagnosis of SARS-CoV-2 by FDA under an Emergency Use Authorization (EUA). This EUA will remain in effect (meaning this test can be used) for the duration of the COVID-19 declaration under Section 564(b)(1) of the Act, 21 U.S.C. section 360bbb-3(b)(1), unless the authorization is terminated or revoked.  Performed at [facility], [place] [address], [place], [place] [address]   HEMOGLOBIN AND HEMATOCRIT, BLOOD     Status: Abnormal   Collection Time: 06/15/20  3:58 PM  Result Value Ref Range   Hemoglobin 9.4 (L) 12.0 - 15.0 g/dL   HCT 30.7 (L) 36.0 - 46.0 %    Comment: Performed at [facility], [place] [address] [place], [place] [address]  Glucose, capillary     Status: Abnormal   Collection Time: 06/15/20  6:42 PM  Result Value Ref Range   Glucose-Capillary 111 (H) 70 - 99 mg/dL    Comment: Glucose reference range applies only to samples taken after fasting for at least 8 hours.  Glucose, capillary     Status: Abnormal   Collection Time:  06/15/20  9:59 PM  Result Value Ref  Range   Glucose-Capillary 101 (H) 70 - 99 mg/dL    Comment: Glucose reference range applies only to samples taken after fasting for at least 8 hours.  HEMOGLOBIN AND HEMATOCRIT, BLOOD     Status: Abnormal   Collection Time: 06/15/20 10:04 PM  Result Value Ref Range   Hemoglobin 8.6 (L) 12.0 - 15.0 g/dL   HCT 27.9 (L) 36.0 - 46.0 %    Comment: Performed at [facility], [place] [address], [place], [place] [address]  HEMOGLOBIN AND HEMATOCRIT, BLOOD     Status: Abnormal   Collection Time: 06/16/20  4:19 AM  Result Value Ref Range   Hemoglobin 9.0 (L) 12.0 - 15.0 g/dL   HCT 29.7 (L) 36.0 - 46.0 %    Comment: Performed at [facility], [place] [address], [place], [place] [address]  Comprehensive metabolic panel     Status: Abnormal   Collection Time: 06/16/20  4:19 AM  Result Value Ref Range   Sodium 140 135 - 145 mmol/L   Potassium 4.1 3.5 - 5.1 mmol/L   Chloride 111 98 - 111 mmol/L   CO2 21 (L) 22 - 32 mmol/L   Glucose, Bld 100 (H) 70 - 99 mg/dL    Comment: Glucose reference range applies only to samples taken after fasting for at least 8 hours.   BUN 16 8 - 23 mg/dL   Creatinine, Ser 1.15 (H) 0.44 - 1.00 mg/dL   Calcium 8.7 (L) 8.9 - 10.3 mg/dL   Total Protein 5.4 (L) 6.5 - 8.1 g/dL   Albumin 3.0 (L) 3.5 - 5.0 g/dL   AST 23 15 - 41 U/L   ALT 13 0 - 44 U/L   Alkaline Phosphatase 68 38 - 126 U/L   Total Bilirubin 1.2 0.3 - 1.2 mg/dL   GFR, Estimated 49 (L) >60 mL/min    Comment: (NOTE) Calculated using the CKD-EPI Creatinine Equation (2021)    Anion gap 8 5 - 15    Comment: Performed at [facility], [place] [address], [place], [place] [address]  Glucose, capillary     Status: Abnormal   Collection Time: 06/16/20  7:31 AM  Result Value Ref Range   Glucose-Capillary 118 (H) 70 - 99 mg/dL    Comment: Glucose reference range applies only to samples taken after fasting for at least 8 hours.   HEMOGLOBIN AND HEMATOCRIT, BLOOD     Status: Abnormal   Collection Time: 06/16/20  9:51 AM  Result Value Ref Range   Hemoglobin 9.5 (L) 12.0 - 15.0 g/dL   HCT 31.8 (L) 36.0 - 46.0 %    Comment: Performed at [facility], [place] [address], [place],  [address]  Glucose, capillary     Status: Abnormal   Collection Time: 06/16/20 11:38 AM  Result Value Ref Range   Glucose-Capillary 131 (H) 70 - 99 mg/dL    Comment: Glucose reference range applies only to samples taken after fasting for at least 8 hours.  HEMOGLOBIN AND HEMATOCRIT, BLOOD     Status: Abnormal   Collection Time: 06/16/20  4:52 PM  Result Value Ref Range   Hemoglobin 9.8 (L) 12.0 - 15.0 g/dL   HCT 33.3 (L) 36.0 - 46.0 %    Comment: Performed at [facility], [place] [address], [place],  [address]  Glucose, capillary     Status: Abnormal   Collection Time: 06/16/20  5:25 PM  Result Value Ref Range   Glucose-Capillary  123 (H) 70 - 99 mg/dL    Comment: Glucose reference range applies only to samples taken after fasting for at least 8 hours.  Glucose, capillary     Status: Abnormal   Collection Time: 06/16/20  9:24 PM  Result Value Ref Range   Glucose-Capillary 125 (H) 70 - 99 mg/dL    Comment: Glucose reference range applies only to samples taken after fasting for at least 8 hours.  HEMOGLOBIN AND HEMATOCRIT, BLOOD     Status: Abnormal   Collection Time: 06/16/20 10:56 PM  Result Value Ref Range   Hemoglobin 9.2 (L) 12.0 - 15.0 g/dL    Comment: REPEATED TO VERIFY   HCT 29.4 (L) 36.0 - 46.0 %    Comment: Performed at [facility], [place] [address] [place], [place] [address]  HEMOGLOBIN AND HEMATOCRIT, BLOOD     Status: Abnormal   Collection Time: 06/17/20  5:52 AM  Result Value Ref Range   Hemoglobin 8.8 (L) 12.0 - 15.0 g/dL   HCT 28.4 (L) 36.0 - 46.0 %    Comment: Performed at [facility], [place] [address] [place], [place] [address]   CBC with Differential/Platelet     Status: Abnormal   Collection Time: 06/17/20  5:52 AM  Result Value Ref Range   WBC 4.0 4.0 - 10.5 K/uL   RBC 3.00 (L) 3.87 - 5.11 MIL/uL   Hemoglobin 8.8 (L) 12.0 - 15.0 g/dL   HCT 29.4 (L) 36.0 - 46.0 %   MCV 98.0 80.0 - 100.0 fL   MCH 29.3 26.0 - 34.0 pg   MCHC 29.9 (L) 30.0 - 36.0 g/dL   RDW 17.0 (H) 11.5 - 15.5 %   Platelets 106 (L) 150 - 400 K/uL    Comment: Immature Platelet Fraction may be clinically indicated, consider ordering this additional test [id number]    nRBC 0.0 0.0 - 0.2 %   Neutrophils Relative % 56 %   Neutro Abs 2.3 1.7 - 7.7 K/uL   Lymphocytes Relative 36 %   Lymphs Abs 1.5 0.7 - 4.0 K/uL   Monocytes Relative 6 %   Monocytes Absolute 0.2 0.1 - 1.0 K/uL   Eosinophils Relative 1 %   Eosinophils Absolute 0.0 0.0 - 0.5 K/uL   Basophils Relative 1 %   Basophils Absolute 0.0 0.0 - 0.1 K/uL   Immature Granulocytes 0 %   Abs Immature Granulocytes 0.01 0.00 - 0.07 K/uL    Comment: Performed at [facility], [place] [address], [place], [place] [address]  Comprehensive metabolic panel     Status: Abnormal   Collection Time: 06/17/20  5:52 AM  Result Value Ref Range   Sodium 139 135 - 145 mmol/L   Potassium 4.3 3.5 - 5.1 mmol/L   Chloride 110 98 - 111 mmol/L   CO2 19 (L) 22 - 32 mmol/L   Glucose, Bld 131 (H) 70 - 99 mg/dL    Comment: Glucose reference range applies only to samples taken after fasting for at least 8 hours.   BUN 12 8 - 23 mg/dL   Creatinine, Ser 1.20 (H) 0.44 - 1.00 mg/dL   Calcium 8.7 (L) 8.9 - 10.3 mg/dL   Total Protein 5.3 (L) 6.5 - 8.1 g/dL   Albumin 2.8 (L) 3.5 - 5.0 g/dL   AST 26 15 - 41 U/L   ALT 15 0 - 44 U/L   Alkaline Phosphatase 67 38 - 126 U/L   Total Bilirubin 0.9 0.3 - 1.2  mg/dL   GFR, Estimated 46 (L) >60 mL/min    Comment: (NOTE) Calculated using the CKD-EPI Creatinine Equation (2021)    Anion gap 10 5 - 15    Comment: Performed at [facility], [place]  [address], [place], [place] [address]  Magnesium     Status: None   Collection Time: 06/17/20  5:52 AM  Result Value Ref Range   Magnesium 1.8 1.7 - 2.4 mg/dL    Comment: Performed at [facility], [place] [address] [place], [place] [address]  Phosphorus     Status: None   Collection Time: 06/17/20  5:52 AM  Result Value Ref Range   Phosphorus 3.6 2.5 - 4.6 mg/dL    Comment: Performed at [facility], [place] [address], [place], [place] [address]  Glucose, capillary     Status: Abnormal   Collection Time: 06/17/20  7:30 AM  Result Value Ref Range   Glucose-Capillary 129 (H) 70 - 99 mg/dL    Comment: Glucose reference range applies only to samples taken after fasting for at least 8 hours.  HEMOGLOBIN AND HEMATOCRIT, BLOOD     Status: Abnormal   Collection Time: 06/17/20 10:29 AM  Result Value Ref Range   Hemoglobin 9.0 (L) 12.0 - 15.0 g/dL   HCT 29.9 (L) 36.0 - 46.0 %    Comment: Performed at [facility], [place] [address] [place], [place] [address]  Surgical pathology     Status: None   Collection Time: 06/17/20  1:21 PM  Result Value Ref Range   SURGICAL PATHOLOGY      SURGICAL PATHOLOGY CASE: [id number] PATIENT: [facility] Surgical Pathology Report     Clinical History: Anemia, hemoccult positive stool, rectal bleeding (crm)     FINAL MICROSCOPIC DIAGNOSIS:  A. DUODENUM, BIOPSY: - Unremarkable duodenal mucosa. - No features of celiac sprue or granulomas.  B. STOMACH, BIOPSY: - Unremarkable gastric mucosa. - Warthin-Starry negative for Helicobacter pylori. - No intestinal metaplasia, dysplasia or carcinoma.  C. COLON, TRANSVERSE, DESCENDING, SIGMOID, RECTUM, POLYPECTOMY: - Tubular adenoma (1). - No high-grade dysplasia or carcinoma. - Hyperplastic polyp (s).  D. RECTUM, BIOPSY: - Unremarkable colonic mucosa with benign lymphoid aggregates. - No active inflammation, chronic changes or granulomas.    GROSS  DESCRIPTION:  A: Received in formalin are tan, soft tissue fragments that are submitted in toto. Number: 4 size: Range from 0.1 to 0.6 cm blocks: 1  B: Received in formalin is a tan, soft tissue fragment tha t is submitted in toto.  Size: 0.4 cm, 1 block submitted.  C: Received in formalin are tan, soft tissue fragments that are submitted in toto. Number: Multiple size: Range from 0.3 to 1.2 x 0.4 x 0.2 cm blocks: 1  D: Received in formalin are tan, soft tissue fragments that are submitted in toto. Number: Multiple size: Range from 0.1 to 0.4 cm blocks: 1 Craig Staggers 06/17/2020)    Final Diagnosis performed by Claudette Laws, MD.   Electronically signed 06/20/2020 Technical and / or Professional components performed at [facility], [place] [address] [place], [place] [address].  Immunohistochemistry Technical component (if applicable) was performed at [facility]. [address], [place], [place], [place] [address].   IMMUNOHISTOCHEMISTRY DISCLAIMER (if applicable): Some of these immunohistochemical stains may have been developed and the performance characteristics determine by [facility]. Some may not have been cleared or approve d by the U.S. Food and Drug Administration. The FDA has  determined that such clearance or approval is not necessary. This test is used for clinical purposes. It should not be regarded as investigational or for research. This laboratory is certified under the [place] (CLIA-88) as qualified to perform high complexity clinical laboratory testing.  The controls stained appropriately.   HEMOGLOBIN AND HEMATOCRIT, BLOOD     Status: Abnormal   Collection Time: 06/17/20  4:34 PM  Result Value Ref Range   Hemoglobin 9.5 (L) 12.0 - 15.0 g/dL   HCT 31.4 (L) 36.0 - 46.0 %    Comment: Performed at [facility] [place] [address] [place], [place] [address]  Glucose,  capillary     Status: Abnormal   Collection Time: 06/17/20  5:51 PM  Result Value Ref Range   Glucose-Capillary 105 (H) 70 - 99 mg/dL    Comment: Glucose reference range applies only to samples taken after fasting for at least 8 hours.   Comment 1 Notify RN    Comment 2 Document in Chart   Glucose, capillary     Status: Abnormal   Collection Time: 06/17/20  9:09 PM  Result Value Ref Range   Glucose-Capillary 129 (H) 70 - 99 mg/dL    Comment: Glucose reference range applies only to samples taken after fasting for at least 8 hours.  HEMOGLOBIN AND HEMATOCRIT, BLOOD     Status: Abnormal   Collection Time: 06/17/20 10:23 PM  Result Value Ref Range   Hemoglobin 9.9 (L) 12.0 - 15.0 g/dL   HCT 32.5 (L) 36.0 - 46.0 %    Comment: Performed at [facility], [place] [address], [place], [place] [address]  Glucose, capillary     Status: Abnormal   Collection Time: 06/18/20 12:23 AM  Result Value Ref Range   Glucose-Capillary 131 (H) 70 - 99 mg/dL    Comment: Glucose reference range applies only to samples taken after fasting for at least 8 hours.  Glucose, capillary     Status: Abnormal   Collection Time: 06/18/20  4:16 AM  Result Value Ref Range   Glucose-Capillary 126 (H) 70 - 99 mg/dL    Comment: Glucose reference range applies only to samples taken after fasting for at least 8 hours.  Glucose, capillary     Status: Abnormal   Collection Time: 06/18/20  7:50 AM  Result Value Ref Range   Glucose-Capillary 119 (H) 70 - 99 mg/dL    Comment: Glucose reference range applies only to samples taken after fasting for at least 8 hours.  CBC with Differential/Platelet     Status: Abnormal   Collection Time: 06/18/20  7:58 AM  Result Value Ref Range   WBC 4.0 4.0 - 10.5 K/uL   RBC 2.98 (L) 3.87 - 5.11 MIL/uL   Hemoglobin 9.0 (L) 12.0 - 15.0 g/dL   HCT 29.3 (L) 36.0 - 46.0 %   MCV 98.3 80.0 - 100.0 fL   MCH 30.2 26.0 - 34.0 pg   MCHC 30.7 30.0 - 36.0 g/dL   RDW 16.9 (H) 11.5 -  15.5 %   Platelets 101 (L) 150 - 400 K/uL    Comment: REPEATED TO VERIFY PLATELET COUNT CONFIRMED BY SMEAR SPECIMEN CHECKED FOR CLOTS Immature Platelet Fraction may be clinically indicated, consider ordering this additional test [id number]    nRBC 0.0 0.0 - 0.2 %   Neutrophils Relative % 58 %   Neutro Abs 2.4 1.7 - 7.7 K/uL   Lymphocytes Relative 34 %   Lymphs  Abs 1.4 0.7 - 4.0 K/uL   Monocytes Relative 5 %   Monocytes Absolute 0.2 0.1 - 1.0 K/uL   Eosinophils Relative 2 %   Eosinophils Absolute 0.1 0.0 - 0.5 K/uL   Basophils Relative 1 %   Basophils Absolute 0.0 0.0 - 0.1 K/uL   Immature Granulocytes 0 %   Abs Immature Granulocytes 0.00 0.00 - 0.07 K/uL   Tear Drop Cells PRESENT     Comment: Performed at [facility], [place] [address] [place], [place] [address]  Comprehensive metabolic panel     Status: Abnormal   Collection Time: 06/18/20  7:58 AM  Result Value Ref Range   Sodium 137 135 - 145 mmol/L   Potassium 4.0 3.5 - 5.1 mmol/L   Chloride 106 98 - 111 mmol/L   CO2 19 (L) 22 - 32 mmol/L   Glucose, Bld 133 (H) 70 - 99 mg/dL    Comment: Glucose reference range applies only to samples taken after fasting for at least 8 hours.   BUN 12 8 - 23 mg/dL   Creatinine, Ser 1.13 (H) 0.44 - 1.00 mg/dL   Calcium 8.4 (L) 8.9 - 10.3 mg/dL   Total Protein 5.5 (L) 6.5 - 8.1 g/dL   Albumin 2.9 (L) 3.5 - 5.0 g/dL   AST 22 15 - 41 U/L   ALT 13 0 - 44 U/L   Alkaline Phosphatase 76 38 - 126 U/L   Total Bilirubin 0.7 0.3 - 1.2 mg/dL   GFR, Estimated 50 (L) >60 mL/min    Comment: (NOTE) Calculated using the CKD-EPI Creatinine Equation (2021)    Anion gap 12 5 - 15    Comment: Performed at [facility], [place] [address], [place], [place] [address]  Magnesium     Status: None   Collection Time: 06/18/20  7:58 AM  Result Value Ref Range   Magnesium 1.8 1.7 - 2.4 mg/dL    Comment: Performed at [facility], [place] [address],  [place], [place] [address]  Phosphorus     Status: None   Collection Time: 06/18/20  7:58 AM  Result Value Ref Range   Phosphorus 3.4 2.5 - 4.6 mg/dL    Comment: Performed at [facility], [place] [address] [place], [place] [address]  Glucose, capillary     Status: Abnormal   Collection Time: 06/18/20 12:07 PM  Result Value Ref Range   Glucose-Capillary 137 (H) 70 - 99 mg/dL    Comment: Glucose reference range applies only to samples taken after fasting for at least 8 hours.    Assessment/Plan: 1. Gastrointestinal hemorrhage, unspecified gastrointestinal hemorrhage type 2. Blood loss anemia GI hemorrhage resolved now the patient is status post RFA of ductal ectasia.  No recurrent melena or hematochezia.  Anemia improving at time of discharge.  Is taking iron supplement as directed.  Noticing subtle improvements in energy levels.  Repeat labs today.  Follow-up with GI as scheduled.  Strict ER precautions discussed with patient who voiced understanding and agreement with the plan. - CBC w/Diff - Comp Met (CMET)  3. 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 Hypertension associated with diabetes ([place]) Blood pressure stable.  Continue propranolol at current dose to help with both blood pressure and esophageal varices.  Follow-up with GI as scheduled. - propranolol (INDERAL) 40 MG tablet; Take 1 tablet (40 mg total) by mouth 2 (two) times daily.  Dispense: 180 tablet; Refill: 1  5. Acute deep vein thrombosis (DVT)  of distal vein of lower extremity, unspecified laterality ([place]) Resumed Xarelto as of 06/21/2020. Tolerating well. She is to notify us of any recurring rectal bleeding although hopefully no recurrence after her procedure. Plan for 3 months of therapy before discontinuing.   This visit occurred during the SARS-CoV-2 public health emergency.  Safety protocols were in place, including screening questions prior to the visit, additional usage of staff PPE, and extensive  cleaning of exam room while observing appropriate contact time as indicated for disinfecting solutions.     Leeanne Rio, PA-C

## 2020-06-23 NOTE — Patient Instructions (Signed)
Please go to the lab today for blood work.  I will call you with your results. We will alter treatment regimen(s) if indicated by your results.   Please continue medications as directed with the following exception: Can decrease the Carafate to once daily.   Please follow-up with your specialists as scheduled.  Let me know if energy is not slowly but surely improving, anything recurs or new symptoms develop.   Hang in there!

## 2020-06-30 ENCOUNTER — Telehealth: Payer: Self-pay

## 2020-06-30 ENCOUNTER — Encounter (HOSPITAL_COMMUNITY): Payer: Self-pay

## 2020-06-30 ENCOUNTER — Inpatient Hospital Stay (HOSPITAL_COMMUNITY)
Admission: EM | Admit: 2020-06-30 | Discharge: 2020-07-05 | DRG: 378 | Disposition: A | Discharge status: Home / Self Care | Payer: Medicare Other | Attending: Internal Medicine | Admitting: Internal Medicine

## 2020-06-30 ENCOUNTER — Inpatient Hospital Stay: Payer: Medicare Other | Attending: Oncology | Admitting: Oncology

## 2020-06-30 ENCOUNTER — Inpatient Hospital Stay: Payer: Medicare Other

## 2020-06-30 ENCOUNTER — Other Ambulatory Visit: Payer: Self-pay

## 2020-06-30 ENCOUNTER — Encounter: Payer: Self-pay | Admitting: *Deleted

## 2020-06-30 VITALS — BP 152/56 | HR 87 | Temp 98.7°F | Resp 17 | Ht 67.0 in | Wt 208.8 lb

## 2020-06-30 DIAGNOSIS — Z79890 Hormone replacement therapy (postmenopausal): Secondary | ICD-10-CM

## 2020-06-30 DIAGNOSIS — E039 Hypothyroidism, unspecified: Secondary | ICD-10-CM | POA: Diagnosis present

## 2020-06-30 DIAGNOSIS — D696 Thrombocytopenia, unspecified: Secondary | ICD-10-CM

## 2020-06-30 DIAGNOSIS — E1122 Type 2 diabetes mellitus with diabetic chronic kidney disease: Secondary | ICD-10-CM | POA: Diagnosis present

## 2020-06-30 DIAGNOSIS — E785 Hyperlipidemia, unspecified: Secondary | ICD-10-CM | POA: Diagnosis present

## 2020-06-30 DIAGNOSIS — I82401 Acute embolism and thrombosis of unspecified deep veins of right lower extremity: Secondary | ICD-10-CM | POA: Diagnosis present

## 2020-06-30 DIAGNOSIS — D62 Acute posthemorrhagic anemia: Secondary | ICD-10-CM | POA: Diagnosis present

## 2020-06-30 DIAGNOSIS — K2971 Gastritis, unspecified, with bleeding: Secondary | ICD-10-CM | POA: Diagnosis not present

## 2020-06-30 DIAGNOSIS — K7581 Nonalcoholic steatohepatitis (NASH): Secondary | ICD-10-CM | POA: Diagnosis present

## 2020-06-30 DIAGNOSIS — K219 Gastro-esophageal reflux disease without esophagitis: Secondary | ICD-10-CM | POA: Diagnosis present

## 2020-06-30 DIAGNOSIS — Z825 Family history of asthma and other chronic lower respiratory diseases: Secondary | ICD-10-CM

## 2020-06-30 DIAGNOSIS — K31811 Angiodysplasia of stomach and duodenum with bleeding: Secondary | ICD-10-CM | POA: Diagnosis present

## 2020-06-30 DIAGNOSIS — Z85038 Personal history of other malignant neoplasm of large intestine: Secondary | ICD-10-CM | POA: Diagnosis not present

## 2020-06-30 DIAGNOSIS — K922 Gastrointestinal hemorrhage, unspecified: Secondary | ICD-10-CM | POA: Diagnosis present

## 2020-06-30 DIAGNOSIS — K7469 Other cirrhosis of liver: Secondary | ICD-10-CM | POA: Diagnosis not present

## 2020-06-30 DIAGNOSIS — D649 Anemia, unspecified: Secondary | ICD-10-CM | POA: Diagnosis not present

## 2020-06-30 DIAGNOSIS — K7460 Unspecified cirrhosis of liver: Secondary | ICD-10-CM | POA: Diagnosis not present

## 2020-06-30 DIAGNOSIS — F419 Anxiety disorder, unspecified: Secondary | ICD-10-CM | POA: Diagnosis present

## 2020-06-30 DIAGNOSIS — Z7901 Long term (current) use of anticoagulants: Secondary | ICD-10-CM | POA: Diagnosis not present

## 2020-06-30 DIAGNOSIS — E8770 Fluid overload, unspecified: Secondary | ICD-10-CM | POA: Diagnosis present

## 2020-06-30 DIAGNOSIS — N1832 Chronic kidney disease, stage 3b: Secondary | ICD-10-CM | POA: Diagnosis present

## 2020-06-30 DIAGNOSIS — R0602 Shortness of breath: Secondary | ICD-10-CM | POA: Diagnosis present

## 2020-06-30 DIAGNOSIS — K3189 Other diseases of stomach and duodenum: Secondary | ICD-10-CM | POA: Diagnosis not present

## 2020-06-30 DIAGNOSIS — K621 Rectal polyp: Secondary | ICD-10-CM | POA: Diagnosis present

## 2020-06-30 DIAGNOSIS — Z7189 Other specified counseling: Secondary | ICD-10-CM | POA: Diagnosis not present

## 2020-06-30 DIAGNOSIS — C189 Malignant neoplasm of colon, unspecified: Secondary | ICD-10-CM | POA: Diagnosis not present

## 2020-06-30 DIAGNOSIS — Z79899 Other long term (current) drug therapy: Secondary | ICD-10-CM | POA: Diagnosis not present

## 2020-06-30 DIAGNOSIS — I82491 Acute embolism and thrombosis of other specified deep vein of right lower extremity: Secondary | ICD-10-CM | POA: Diagnosis not present

## 2020-06-30 DIAGNOSIS — E1159 Type 2 diabetes mellitus with other circulatory complications: Secondary | ICD-10-CM

## 2020-06-30 DIAGNOSIS — K644 Residual hemorrhoidal skin tags: Secondary | ICD-10-CM | POA: Diagnosis present

## 2020-06-30 DIAGNOSIS — D689 Coagulation defect, unspecified: Secondary | ICD-10-CM | POA: Diagnosis not present

## 2020-06-30 DIAGNOSIS — Z9221 Personal history of antineoplastic chemotherapy: Secondary | ICD-10-CM

## 2020-06-30 DIAGNOSIS — I82409 Acute embolism and thrombosis of unspecified deep veins of unspecified lower extremity: Secondary | ICD-10-CM

## 2020-06-30 DIAGNOSIS — K633 Ulcer of intestine: Secondary | ICD-10-CM | POA: Diagnosis not present

## 2020-06-30 DIAGNOSIS — Z8249 Family history of ischemic heart disease and other diseases of the circulatory system: Secondary | ICD-10-CM

## 2020-06-30 DIAGNOSIS — I152 Hypertension secondary to endocrine disorders: Secondary | ICD-10-CM | POA: Diagnosis present

## 2020-06-30 DIAGNOSIS — K766 Portal hypertension: Secondary | ICD-10-CM | POA: Diagnosis present

## 2020-06-30 DIAGNOSIS — E1169 Type 2 diabetes mellitus with other specified complication: Secondary | ICD-10-CM | POA: Diagnosis present

## 2020-06-30 DIAGNOSIS — D6959 Other secondary thrombocytopenia: Secondary | ICD-10-CM | POA: Diagnosis present

## 2020-06-30 DIAGNOSIS — Z885 Allergy status to narcotic agent status: Secondary | ICD-10-CM

## 2020-06-30 DIAGNOSIS — Z515 Encounter for palliative care: Secondary | ICD-10-CM | POA: Diagnosis not present

## 2020-06-30 DIAGNOSIS — K921 Melena: Secondary | ICD-10-CM

## 2020-06-30 DIAGNOSIS — Z8719 Personal history of other diseases of the digestive system: Secondary | ICD-10-CM

## 2020-06-30 DIAGNOSIS — Z9049 Acquired absence of other specified parts of digestive tract: Secondary | ICD-10-CM

## 2020-06-30 DIAGNOSIS — I824Y9 Acute embolism and thrombosis of unspecified deep veins of unspecified proximal lower extremity: Secondary | ICD-10-CM | POA: Diagnosis not present

## 2020-06-30 DIAGNOSIS — K6389 Other specified diseases of intestine: Secondary | ICD-10-CM | POA: Diagnosis not present

## 2020-06-30 DIAGNOSIS — Z833 Family history of diabetes mellitus: Secondary | ICD-10-CM

## 2020-06-30 DIAGNOSIS — Z20822 Contact with and (suspected) exposure to covid-19: Secondary | ICD-10-CM | POA: Diagnosis present

## 2020-06-30 DIAGNOSIS — N179 Acute kidney failure, unspecified: Secondary | ICD-10-CM | POA: Diagnosis present

## 2020-06-30 DIAGNOSIS — K7290 Hepatic failure, unspecified without coma: Secondary | ICD-10-CM

## 2020-06-30 DIAGNOSIS — Z905 Acquired absence of kidney: Secondary | ICD-10-CM

## 2020-06-30 LAB — SAMPLE TO BLOOD BANK

## 2020-06-30 LAB — GLUCOSE, CAPILLARY: Glucose-Capillary: 154 mg/dL — ABNORMAL HIGH (ref 70–99)

## 2020-06-30 LAB — PREPARE RBC (CROSSMATCH)

## 2020-06-30 LAB — COMPREHENSIVE METABOLIC PANEL
ALT: 17 U/L (ref 0–44)
AST: 27 U/L (ref 15–41)
Albumin: 2.9 g/dL — ABNORMAL LOW (ref 3.5–5.0)
Alkaline Phosphatase: 69 U/L (ref 38–126)
Anion gap: 8 (ref 5–15)
BUN: 49 mg/dL — ABNORMAL HIGH (ref 8–23)
CO2: 23 mmol/L (ref 22–32)
Calcium: 8.8 mg/dL — ABNORMAL LOW (ref 8.9–10.3)
Chloride: 109 mmol/L (ref 98–111)
Creatinine, Ser: 1.97 mg/dL — ABNORMAL HIGH (ref 0.44–1.00)
GFR, Estimated: 26 mL/min — ABNORMAL LOW (ref >60)
Glucose, Bld: 116 mg/dL — ABNORMAL HIGH (ref 70–99)
Potassium: 4.3 mmol/L (ref 3.5–5.1)
Sodium: 140 mmol/L (ref 135–145)
Total Bilirubin: 1 mg/dL (ref 0.3–1.2)
Total Protein: 5.7 g/dL — ABNORMAL LOW (ref 6.5–8.1)

## 2020-06-30 LAB — CBC WITH DIFFERENTIAL (CANCER CENTER ONLY)
Abs Immature Granulocytes: 0.02 10*3/uL (ref 0.00–0.07)
Basophils Absolute: 0 10*3/uL (ref 0.0–0.1)
Basophils Relative: 1 %
Eosinophils Absolute: 0.1 10*3/uL (ref 0.0–0.5)
Eosinophils Relative: 1 %
HCT: 21.3 % — ABNORMAL LOW (ref 36.0–46.0)
Hemoglobin: 6.4 g/dL — CL (ref 12.0–15.0)
Immature Granulocytes: 0 %
Lymphocytes Relative: 40 %
Lymphs Abs: 2.4 10*3/uL (ref 0.7–4.0)
MCH: 31.5 pg (ref 26.0–34.0)
MCHC: 30 g/dL (ref 30.0–36.0)
MCV: 104.9 fL — ABNORMAL HIGH (ref 80.0–100.0)
Monocytes Absolute: 0.3 10*3/uL (ref 0.1–1.0)
Monocytes Relative: 4 %
Neutro Abs: 3.2 10*3/uL (ref 1.7–7.7)
Neutrophils Relative %: 54 %
Platelet Count: 140 10*3/uL — ABNORMAL LOW (ref 150–400)
RBC: 2.03 MIL/uL — ABNORMAL LOW (ref 3.87–5.11)
RDW: 20.6 % — ABNORMAL HIGH (ref 11.5–15.5)
WBC Count: 5.9 10*3/uL (ref 4.0–10.5)
nRBC: 0 % (ref 0.0–0.2)

## 2020-06-30 LAB — RESP PANEL BY RT-PCR (FLU A&B, COVID) ARPGX2
Influenza A by PCR: NEGATIVE
Influenza B by PCR: NEGATIVE
SARS Coronavirus 2 by RT PCR: NEGATIVE

## 2020-06-30 LAB — PROTIME-INR
INR: 2.1 — ABNORMAL HIGH (ref 0.8–1.2)
Prothrombin Time: 22.9 seconds — ABNORMAL HIGH (ref 11.4–15.2)

## 2020-06-30 LAB — CEA (IN HOUSE-CHCC): CEA (CHCC-In House): 3.24 ng/mL (ref 0.00–5.00)

## 2020-06-30 MED ORDER — PEG-KCL-NACL-NASULF-NA ASC-C 100 G PO SOLR
0.5000 | Freq: Once | ORAL | Status: AC
  Administered 2020-06-30: 22:00:00 100 g via ORAL
  Filled 2020-06-30: qty 1

## 2020-06-30 MED ORDER — OCTREOTIDE LOAD VIA INFUSION
50.0000 ug | Freq: Once | INTRAVENOUS | Status: AC
  Administered 2020-06-30: 16:00:00 50 ug via INTRAVENOUS
  Filled 2020-06-30: qty 25

## 2020-06-30 MED ORDER — POLYETHYLENE GLYCOL 3350 17 G PO PACK: 17.0000 g | PACK | Freq: Every day | ORAL | Status: DC | PRN

## 2020-06-30 MED ORDER — SODIUM CHLORIDE 0.9 % IV SOLN: INTRAVENOUS | Status: DC

## 2020-06-30 MED ORDER — PROPRANOLOL HCL 40 MG PO TABS
40.0000 mg | ORAL_TABLET | Freq: Two times a day (BID) | ORAL | Status: DC
  Administered 2020-06-30 – 2020-07-05 (×9): 40 mg via ORAL
  Filled 2020-06-30 (×13): qty 1

## 2020-06-30 MED ORDER — PEG-KCL-NACL-NASULF-NA ASC-C 100 G PO SOLR: 1.0000 | Freq: Once | ORAL | Status: DC

## 2020-06-30 MED ORDER — SODIUM CHLORIDE 0.9 % IV SOLN
50.0000 ug/h | INTRAVENOUS | Status: DC
  Administered 2020-06-30 – 2020-07-04 (×8): 50 ug/h via INTRAVENOUS
  Filled 2020-06-30 (×11): qty 1

## 2020-06-30 MED ORDER — ACETAMINOPHEN 325 MG PO TABS: 650.0000 mg | ORAL_TABLET | Freq: Four times a day (QID) | ORAL | Status: DC | PRN

## 2020-06-30 MED ORDER — SODIUM CHLORIDE 0.9 % IV SOLN
80.0000 mg | Freq: Once | INTRAVENOUS | Status: AC
  Administered 2020-06-30: 14:00:00 80 mg via INTRAVENOUS
  Filled 2020-06-30: qty 80

## 2020-06-30 MED ORDER — LEVOTHYROXINE SODIUM 88 MCG PO TABS
88.0000 ug | ORAL_TABLET | Freq: Every day | ORAL | Status: DC
  Administered 2020-07-02 – 2020-07-05 (×4): 88 ug via ORAL
  Filled 2020-06-30 (×4): qty 1

## 2020-06-30 MED ORDER — PEG-KCL-NACL-NASULF-NA ASC-C 100 G PO SOLR
0.5000 | Freq: Once | ORAL | Status: AC
  Administered 2020-06-30: 23:00:00 100 g via ORAL
  Filled 2020-06-30: qty 1

## 2020-06-30 MED ORDER — SODIUM CHLORIDE 0.9 % IV SOLN
8.0000 mg/h | INTRAVENOUS | Status: AC
  Administered 2020-06-30 – 2020-07-03 (×7): 8 mg/h via INTRAVENOUS
  Filled 2020-06-30 (×9): qty 80

## 2020-06-30 MED ORDER — AMLODIPINE BESYLATE 5 MG PO TABS
2.5000 mg | ORAL_TABLET | Freq: Every day | ORAL | Status: DC
  Administered 2020-06-30 – 2020-07-05 (×5): 2.5 mg via ORAL
  Filled 2020-06-30 (×6): qty 1

## 2020-06-30 MED ORDER — INSULIN ASPART 100 UNIT/ML ~~LOC~~ SOLN
0.0000 [IU] | Freq: Three times a day (TID) | SUBCUTANEOUS | Status: DC
  Administered 2020-07-01 (×2): 1 [IU] via SUBCUTANEOUS
  Administered 2020-07-02: 3 [IU] via SUBCUTANEOUS
  Administered 2020-07-02 – 2020-07-03 (×2): 1 [IU] via SUBCUTANEOUS
  Administered 2020-07-03: 08:00:00 2 [IU] via SUBCUTANEOUS
  Administered 2020-07-03 – 2020-07-04 (×2): 1 [IU] via SUBCUTANEOUS
  Administered 2020-07-04 – 2020-07-05 (×3): 2 [IU] via SUBCUTANEOUS
  Filled 2020-06-30: qty 0.09

## 2020-06-30 MED ORDER — SODIUM CHLORIDE 0.9% IV SOLUTION: Freq: Once | INTRAVENOUS | Status: AC

## 2020-06-30 MED ORDER — ACETAMINOPHEN 650 MG RE SUPP: 650.0000 mg | Freq: Four times a day (QID) | RECTAL | Status: DC | PRN

## 2020-06-30 MED ORDER — SODIUM CHLORIDE 0.9 % IV SOLN
2.0000 g | INTRAVENOUS | Status: DC
  Administered 2020-06-30 – 2020-07-05 (×6): 2 g via INTRAVENOUS
  Filled 2020-06-30 (×2): qty 2
  Filled 2020-06-30: qty 20
  Filled 2020-06-30 (×3): qty 2

## 2020-06-30 NOTE — Progress Notes (Signed)
[place] OFFICE PROGRESS NOTE   Diagnosis: Colon cancer She does not feel well. INTERVAL HISTORY:   Cheryl Jordan returns as scheduled.  She does not feel well.  She has been admitted to the hospital several times over the past few months.  She has undergone treatment for cirrhosis and was placed on Xarelto for a right lower extremity DVT.  She was admitted with anemia on 06/15/2020.  She was transfused with packed red blood cells.  She reports rectal bleeding. She underwent an upper endoscopy 06/17/2020.  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  She was started on antacid therapy twice daily. A colonoscopy on 06/17/2020 revealed hemorrhoids on digital exam.  6 polyps were removed from the colon and rectum. The pathology revealed a tubular adenoma and hyperplastic polyps.  Cheryl Jordan was discharged from the hospital on 06/18/2020.  She reports rectal bleeding with bowel movements beginning yesterday.  The stool is "black ".  She complains of malaise.  Objective:  Vital signs in last 24 hours:  Blood pressure (!) 152/56, pulse 87, temperature 98.7 F (37.1 C), temperature source Tympanic, resp. rate 17, height 5' 7"  (1.702 m), weight 208 lb 12.8 oz (94.7 kg), SpO2 100 %.    HEENT: The conjunctivae are pale Lymphatics: No cervical, supraclavicular, axillary, or inguinal nodes Resp: End inspiratory coarse rhonchi at the posterior base bilaterally, no respiratory distress Cardio: Regular rate and rhythm, 2/6 systolic murmur GI: Distended with ascites, no hepatosplenomegaly Vascular: Trace right greater than left lower leg edema Rectal: Nonthrombosed external hemorrhoid, no rectal mass.  Dark stool-floridly Hemoccult positive.  No gross blood on examination glove.    Lab Results:  Lab Results  Component Value Date   WBC 4.1 06/23/2020   HGB 9.6 (L) 06/23/2020    HCT 29.8 (L) 06/23/2020   MCV 93.8 06/23/2020   PLT 112.0 (L) 06/23/2020   NEUTROABS 2.5 06/23/2020    CMP  Lab Results  Component Value Date   NA 139 06/23/2020   K 4.5 06/23/2020   CL 109 06/23/2020   CO2 25 06/23/2020   GLUCOSE 175 (H) 06/23/2020   BUN 20 06/23/2020   CREATININE 1.25 (H) 06/23/2020   CALCIUM 8.5 06/23/2020   PROT 5.7 (L) 06/23/2020   ALBUMIN 3.3 (L) 06/23/2020   AST 27 06/23/2020   ALT 12 06/23/2020   ALKPHOS 72 06/23/2020   BILITOT 0.6 06/23/2020   GFRNONAA 50 (L) 06/18/2020   GFRAA 59 (L) 11/17/2017    Lab Results  Component Value Date   CEA1 5.11 (H) 12/29/2019    Medications: I have reviewed the patient's current medications.   Assessment/Plan: 1. 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 ? Loss of MLH1 and PMS2 expression ? Microsatellite instability-high ? CT chest 03/03/2015-negative for metastatic disease ? Cycle 1 adjuvant Xeloda 03/18/2015 ? Cycle 2 adjuvant Xeloda 04/09/2015 ? Cycle 3 adjuvant Xeloda 04/30/2015 ? Cycle 4 adjuvant Xeloda 05/21/2015 ? Cycle 5 adjuvant Xeloda 06/11/2015 (dose reduced due to desquamation of feet). ? Cycle 6 adjuvant Xeloda 07/02/2015 ? Cycle 7 adjuvant Xeloda 07/23/2015 ? Cycle 8 adjuvant Xeloda 08/13/2015 ? CTs chest, abdomen, and pelvis 02/06/2016-cirrhosis, heterogenous enhancement of the liver, small hypoenhancing lesions in the liver-? 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 Likely regenerative nodules in the liver;does not appear as HCCor metastases. Left renal mass very slow growing. CT scan recommended in one year) ? Colonoscopy 08/06/2016-multiple 5-15 mm very  subtle sessile serrated adenomasfound in the transverse colon and at the anastomosis. Next colonoscopy at a one-year interval. ? CT 12/23/2017-no evidence of recurrent colon cancer, cirrhotic liver with no dominant mass, left renal mass-cystic component mildly larger compared to 2017 ? Colonoscopy 06/17/2020-hyperplastic polyps and a tubular adenoma removed from the rectum and colon  2. Anemia, microcytic. Likely iron deficiency from previous GI blood loss. Oral iron initiated 03/17/2015. Improved.  Progressive anemia, status post a red cell transfusion 06/15/2020  3. 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 She is followed by Dr. Alyson Ingles, urology.Unchanged on CT 02/06/2016, unchanged on MRI 12/06/2016, cystic component slightly larger on CT 12/23/2017 4. Diabetes 5. Hypertension 6. History of mild hand/foot syndrome secondary to Xeloda 7. Enlarging segment 8 liver lesion noted on MRI 12/06/2016-felt to potentially represent a metastasis or HCC  Ultrasound-guided biopsy of the lesion on 02/06/2017 revealed cirrhosis, no malignancy  CT 12/23/2017-chronic liver nodularity, no dominant mass, changes of cirrhosis 8. Cirrhosis 9. History of mild thrombocytopenia-likely secondary to cirrhosis 10. Right lower extremity DVT 05/11/2020-Xarelto    Disposition: Cheryl Jordan is in remission from colon cancer.  She has cirrhosis and has been admitted with decompensated cirrhosis over the past few months.  She is being treated for right lower extremity DVT.  She has a history of progressive anemia over the past 2 months requiring a red cell transfusion earlier this month.  I suspect the anemia is related to chronic disease and bleeding.  The stool is Hemoccult positive today.  She complains of malaise.  I suspect the malaise is related to anemia.  She will return to the lab for a CBC today.  We will arrange for a red cell transfusion and hospital admission as indicated.  Xarelto  anticoagulation is being managed by her primary provider and the GI service.  We will need to discuss the indication for holding Xarelto and switching to an alternate form of anticoagulation.  I will schedule her to return in 2-3 months.  I am available to see her sooner as needed.  [name], MD  06/30/2020  11:11 AM

## 2020-06-30 NOTE — Telephone Encounter (Signed)
CRITICAL VALUE STICKER  CRITICAL VALUE: Hgb = 6.4  RECEIVER (on-site recipient of call): Yetta Glassman, [place] NOTIFIED: 06/30/20 at 12:10pm  MESSENGER (representative from lab): Pam  MD NOTIFIED: Dr. Benay Spice  TIME OF NOTIFICATION: 06/30/20 at 12:12pm  RESPONSE: Notification given to Colletta [place], LPN for follow-up with provider.

## 2020-06-30 NOTE — Progress Notes (Signed)
Per Dr. Benay Spice order: transferred patient to [facility] ER room #2 via w/c in stable condition for evaluation by MD for GI bleeding and transfusion due to Hgb 6.4 today. Report given to nurse Rolla Plate, RN. Husband went home. Informed nurse that blood bank tube was sent to lab and she has been banded.

## 2020-06-30 NOTE — ED Notes (Signed)
ED TO INPATIENT HANDOFF REPORT  Name/Age/Gender Cheryl Jordan 79 y.o. female  Code Status    Code Status Orders  (From admission, onward)         Start     Ordered   06/30/20 1510  Limited resuscitation (code)  Continuous       Question Answer Comment  In the event of cardiac or respiratory ARREST: Initiate Code Blue, Call Rapid Response Yes   In the event of cardiac or respiratory ARREST: Perform CPR Yes   In the event of cardiac or respiratory ARREST: Perform Intubation/Mechanical Ventilation No   In the event of cardiac or respiratory ARREST: Use NIPPV/BiPAp only if indicated Yes   In the event of cardiac or respiratory ARREST: Administer ACLS medications if indicated Yes   In the event of cardiac or respiratory ARREST: Perform Defibrillation or Cardioversion if indicated Yes   Comments confirmed at bedside with patient      06/30/20 1509        Code Status History    Date Active Date Inactive Code Status Order ID Comments User Context   06/15/2020 1539 06/18/2020 2053 DNR [id number]  [name], DO Inpatient   05/10/2020 1605 05/12/2020 1606 Full Code [id number]  [name], MD ED   04/29/2020 1521 05/02/2020 1839 Partial Code [id number]  [name], MD Inpatient   11/16/2017 1820 11/17/2017 1733 Full Code [id number]  [name], MD Inpatient   02/10/2015 1638 02/16/2015 1335 Full Code [id number]  [name], MD Inpatient   10/16/2011 1101 10/18/2011 1643 Full Code [id number]  [name], RN Inpatient   Advance Care Planning Activity      Home/SNF/Other Home  Chief Complaint GI bleed [K92.2]  Level of Care/Admitting Diagnosis ED Disposition    ED Disposition Condition [place]: [facility] [[id number]]  Level of Care: Med-Surg [16]  May admit patient to [name] or [name] if equivalent level of care is available:: Yes  Covid Evaluation: Asymptomatic Screening Protocol (No Symptoms)  Diagnosis: GI bleed  [299242]  Admitting Physician: [name] [[id number]]  Attending Physician: [name] [[id number]]  Estimated length of stay: past midnight tomorrow  Certification:: I certify this patient will need inpatient services for at least 2 midnights       Medical History Past Medical History:  Diagnosis Date  . Allergy   . Anemia    hx of  . Anxiety   . Arthritis   . Colon cancer ([place]) dx'd 11/2014  . Diabetes mellitus without complication ([place])    TYPE II  . Fatty liver   . Gallbladder disease   . GERD (gastroesophageal reflux disease)   . History of gout   . Hypertension   . Hypothyroidism   . Kidney disorder    spot on left kidney  . Osteoporosis   . PONV (postoperative nausea and vomiting)     Allergies Allergies  Allergen Reactions  . Aspirin Other (See Comments)    Runny nose   . Codeine Nausea And Vomiting    IV Location/Drains/Wounds Patient Lines/Drains/Airways Status    Active Line/Drains/Airways    Name Placement date Placement time Site Days   Peripheral IV 06/30/20 Left Antecubital 06/30/20  1300  Antecubital  less than 1   Peripheral IV 06/30/20 Right;Anterior Forearm 06/30/20  1336  Forearm  less than 1   Peripheral IV 06/30/20 Right;Posterior Hand 06/30/20  1613  Hand  less than  1          Labs/Imaging Results for orders placed or performed during the hospital encounter of 06/30/20 (from the past 48 hour(s))  Comprehensive metabolic panel     Status: Abnormal   Collection Time: 06/30/20  1:01 PM  Result Value Ref Range   Sodium 140 135 - 145 mmol/L   Potassium 4.3 3.5 - 5.1 mmol/L   Chloride 109 98 - 111 mmol/L   CO2 23 22 - 32 mmol/L   Glucose, Bld 116 (H) 70 - 99 mg/dL    Comment: Glucose reference range applies only to samples taken after fasting for at least 8 hours.   BUN 49 (H) 8 - 23 mg/dL   Creatinine, Ser 1.97 (H) 0.44 - 1.00 mg/dL   Calcium 8.8 (L) 8.9 - 10.3 mg/dL   Total Protein 5.7 (L) 6.5 - 8.1 g/dL   Albumin 2.9 (L) 3.5 -  5.0 g/dL   AST 27 15 - 41 U/L   ALT 17 0 - 44 U/L   Alkaline Phosphatase 69 38 - 126 U/L   Total Bilirubin 1.0 0.3 - 1.2 mg/dL   GFR, Estimated 26 (L) >60 mL/min    Comment: (NOTE) Calculated using the CKD-EPI Creatinine Equation (2021)    Anion gap 8 5 - 15    Comment: Performed at [facility], [place] [address] [place], [place] [address]  Protime-INR     Status: Abnormal   Collection Time: 06/30/20  1:01 PM  Result Value Ref Range   Prothrombin Time 22.9 (H) 11.4 - 15.2 seconds   INR 2.1 (H) 0.8 - 1.2    Comment: (NOTE) INR goal varies based on device and disease states. Performed at [facility], [place] [address] [place], [place] [address]   Resp Panel by RT-PCR (Flu A&B, Covid) Nasopharyngeal Swab     Status: None   Collection Time: 06/30/20  1:26 PM   Specimen: Nasopharyngeal Swab; Nasopharyngeal(NP) swabs in vial transport medium  Result Value Ref Range   SARS Coronavirus 2 by RT PCR NEGATIVE NEGATIVE    Comment: (NOTE) SARS-CoV-2 target nucleic acids are NOT DETECTED.  The SARS-CoV-2 RNA is generally detectable in upper respiratory specimens during the acute phase of infection. The lowest concentration of SARS-CoV-2 viral copies this assay can detect is 138 copies/mL. A negative result does not preclude SARS-Cov-2 infection and should not be used as the sole basis for treatment or other patient management decisions. A negative result may occur with  improper specimen collection/handling, submission of specimen other than nasopharyngeal swab, presence of viral mutation(s) within the areas targeted by this assay, and inadequate number of viral copies(<138 copies/mL). A negative result must be combined with clinical observations, patient history, and epidemiological information. The expected result is Negative.  Fact Sheet for Patients:  EntrepreneurPulse.com.au  Fact Sheet for Healthcare Providers:   IncredibleEmployment.be  This test is no t yet approved or cleared by the [place] FDA and  has been authorized for detection and/or diagnosis of SARS-CoV-2 by FDA under an Emergency Use Authorization (EUA). This EUA will remain  in effect (meaning this test can be used) for the duration of the COVID-19 declaration under Section 564(b)(1) of the Act, 21 U.S.C.section 360bbb-3(b)(1), unless the authorization is terminated  or revoked sooner.       Influenza A by PCR NEGATIVE NEGATIVE   Influenza B by PCR NEGATIVE NEGATIVE    Comment: (NOTE) The Xpert Xpress SARS-CoV-2/FLU/RSV plus assay is intended as  an aid in the diagnosis of influenza from Nasopharyngeal swab specimens and should not be used as a sole basis for treatment. Nasal washings and aspirates are unacceptable for Xpert Xpress SARS-CoV-2/FLU/RSV testing.  Fact Sheet for Patients: EntrepreneurPulse.com.au  Fact Sheet for Healthcare Providers: IncredibleEmployment.be  This test is not yet approved or cleared by the [place] FDA and has been authorized for detection and/or diagnosis of SARS-CoV-2 by FDA under an Emergency Use Authorization (EUA). This EUA will remain in effect (meaning this test can be used) for the duration of the COVID-19 declaration under Section 564(b)(1) of the Act, 21 U.S.C. section 360bbb-3(b)(1), unless the authorization is terminated or revoked.  Performed at [facility], [place] [address], [place], [place] [address]   Prepare RBC (crossmatch)     Status: None   Collection Time: 06/30/20  1:26 PM  Result Value Ref Range   Order Confirmation      ORDER PROCESSED BY BLOOD BANK Performed at [facility] [address] [place], [place] [address]   Type and screen [place]     Status: None (Preliminary result)   Collection Time: 06/30/20  1:50 PM  Result Value Ref Range    ABO/RH(D) A POS    Antibody Screen NEG    Sample Expiration 07/03/2020,2359    Unit Number [id number]    Blood Component Type RBC LR PHER1    Unit division 00    Status of Unit ISSUED    Transfusion Status OK TO TRANSFUSE    Crossmatch Result      Compatible Performed at [facility], [place] [address] [place], [place] [address]    Unit Number [id number]    Blood Component Type RBC LR PHER2    Unit division 00    Status of Unit ALLOCATED    Transfusion Status OK TO TRANSFUSE    Crossmatch Result Compatible    No results found.  Pending Labs Unresulted Labs (From admission, onward)          Start     Ordered   07/01/20 2595  Basic metabolic panel  Daily,   R      06/30/20 1509   07/01/20 0500  CBC  Daily,   R      06/30/20 1509          Vitals/Pain Today's Vitals   06/30/20 1422 06/30/20 1430 06/30/20 1437 06/30/20 1500  BP: (!) 138/54 (!) 136/57 (!) 133/58 (!) 145/54  Pulse: 77 78 78 79  Resp: _0 (!) 21  Temp: 97.9 F (36.6 C)  98 F (36.7 C)   TempSrc: Oral  Oral   SpO2: 97% 96% 97% 98%    Isolation Precautions No active isolations  Medications Medications  pantoprazole (PROTONIX) 80 mg in sodium chloride 0.9 % 100 mL (0.8 mg/mL) infusion (8 mg/hr Intravenous New Bag/Given 06/30/20 1427)  acetaminophen (TYLENOL) tablet 650 mg (has no administration in time range)    Or  acetaminophen (TYLENOL) suppository 650 mg (has no administration in time range)  polyethylene glycol (MIRALAX / GLYCOLAX) packet 17 g (has no administration in time range)  octreotide (SANDOSTATIN) 2 mcg/mL load via infusion 50 mcg (50 mcg Intravenous Bolus from Bag 06/30/20 1619)    And  octreotide (SANDOSTATIN) 500 mcg in sodium chloride 0.9 % 250 mL (2 mcg/mL) infusion (50 mcg/hr Intravenous New Bag/Given 06/30/20 1618)  cefTRIAXone (ROCEPHIN) 2 g in sodium chloride 0.9 % 100 mL IVPB (0 g  Intravenous Stopped 06/30/20 1601)  peg 3350 powder (MOVIPREP)  kit 100 g (has no administration in time range)    And  peg 3350 powder (MOVIPREP) kit 100 g (has no administration in time range)  0.9 %  sodium chloride infusion (Manually program via Guardrails IV Fluids) ( Intravenous New Bag/Given 06/30/20 1329)  pantoprazole (PROTONIX) 80 mg in sodium chloride 0.9 % 100 mL IVPB (0 mg Intravenous Stopped 06/30/20 1430)    Mobility walks

## 2020-06-30 NOTE — ED Provider Notes (Signed)
[facility] DEPT Provider Note   CSN: [id number] Arrival date & time: 06/30/20  1239     History Chief Complaint  Patient presents with  . Abnormal Lab  . Weakness    Cheryl Jordan is a 79 y.o. female.  Pt presents to the ED today with weakness.  Pt was admitted to the hospital from 12/1-12/4 for symptomatic anemia.  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  She had a colonoscopy which showed hemorrhoids and polyps.  Path showed tubular adenoma and hyperplastic polyps.  She has a hx of colon cancer, but is in remission.  She is on Xarelto for a RLE DVT.  She went to oncology today for a 6 month follow up.  They checked blood which showed a hgb of 6.4.  Hgb was 9.6 on 12/9.  Pt said she's had black stool, but is on iron.  She had some blood in her stool yesterday.  Stool at the cancer center was guaiac positive.        Past Medical History:  Diagnosis Date  . Allergy   . Anemia    hx of  . Anxiety   . Arthritis   . Colon cancer ([place]) dx'd 11/2014  . Diabetes mellitus without complication ([place])    TYPE II  . Fatty liver   . Gallbladder disease   . GERD (gastroesophageal reflux disease)   . History of gout   . Hypertension   . Hypothyroidism   . Kidney disorder    spot on left kidney  . Osteoporosis   . PONV (postoperative nausea and vomiting)     Patient Active Problem List   Diagnosis Date Noted  . GI bleeding 06/16/2020  . GIB (gastrointestinal bleeding) 06/15/2020  . SOB (shortness of breath) 05/10/2020  . Anemia 05/10/2020  . Enteritis 04/29/2020  . AKI (acute kidney injury) ([place]) 04/29/2020  . Abnormal finding on imaging of liver 02/01/2019  . Chronic kidney disease (CKD), stage III (moderate) ([place]) 12/09/2018  . Malignant neoplasm of colon ([place]) 08/11/2015  . 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  Adenocarcinoma of colon ([place]) 01/05/2015  . Hypertension associated with diabetes ([place]) 12/17/2013  . Arthritis 12/17/2013  . Hypothyroidism 10/16/2011  . GERD (gastroesophageal reflux disease) 10/16/2011    Past Surgical History:  Procedure Laterality Date  . BIOPSY  06/17/2020   Procedure: BIOPSY;  Surgeon: Rush Landmark Telford Nab., MD;  Location: [facility];  Service: Gastroenterology;;  . CHOLECYSTECTOMY    . COLONOSCOPY WITH PROPOFOL N/A 06/17/2020   Procedure: COLONOSCOPY WITH PROPOFOL;  Surgeon: Rush Landmark Telford Nab., MD;  Location: [facility];  Service: Gastroenterology;  Laterality: N/A;  . ESOPHAGOGASTRODUODENOSCOPY (EGD) WITH PROPOFOL N/A 06/17/2020   Procedure: ESOPHAGOGASTRODUODENOSCOPY (EGD) WITH PROPOFOL;  Surgeon: Rush Landmark Telford Nab., MD;  Location: [facility];  Service: Gastroenterology;  Laterality: N/A;  . FOREIGN BODY REMOVAL  06/17/2020   Procedure: FOREIGN BODY REMOVAL;  Surgeon: Rush Landmark Telford Nab., MD;  Location: [facility];  Service: Gastroenterology;;  . GI RADIOFREQUENCY ABLATION  06/17/2020   Procedure: GI RADIOFREQUENCY ABLATION;  Surgeon: Irving Copas., MD;  Location: [facility];  Service: Gastroenterology;;  . HEMOSTASIS CLIP PLACEMENT  06/17/2020   Procedure: HEMOSTASIS CLIP PLACEMENT;  Surgeon: Irving Copas., MD;  Location: [facility];  Service: Gastroenterology;;  . IR RADIOLOGIST EVAL & MGMT  01/24/2017  . KNEE CARTILAGE SURGERY Bilateral   .  NEPHRECTOMY Left 02/10/2015   Procedure: OPEN RETROPERINTONEAL EXPLORATION LEFT RENAL  CYST DECORTICATION X 5;  Surgeon: Cleon Gustin, MD;  Location: [facility];  Service: Urology;  Laterality: Left;  . PARTIAL COLECTOMY N/A 02/10/2015   Procedure: OPEN RIGHT  COLECTOMY ;  Surgeon: [name], MD;  Location: [facility];  Service: General;  Laterality: N/A;  . POLYPECTOMY  06/17/2020   Procedure: POLYPECTOMY;  Surgeon: Irving Copas., MD;  Location: [facility];  Service:  Gastroenterology;;  . TUBAL LIGATION       OB History   No obstetric history on file.     Family History  Problem Relation Age of Onset  . Diabetes Mother   . Hypertension Other   . COPD Father   . Hypertension Sister   . GER disease Sister   . Heart attack Brother   . COPD Brother   . Lung cancer Brother   . COPD Brother   . Hypertension Brother     Social History   Tobacco Use  . Smoking status: Never Smoker  . Smokeless tobacco: Never Used  Vaping Use  . Vaping Use: Never used  Substance Use Topics  . Alcohol use: No  . Drug use: No    Home Medications Prior to Admission medications   Medication Sig Start Date End Date Taking? Authorizing Provider  acetaminophen (TYLENOL) 500 MG tablet Take 500 mg by mouth every 6 (six) hours as needed for moderate pain.   Yes [provider]  amLODipine (NORVASC) 2.5 MG tablet Take 1 tablet (2.5 mg total) by mouth daily. 12/04/19  Yes Brunetta Jeans, PA-C  ferrous sulfate 325 (65 FE) MG EC tablet Take 1 tablet (325 mg total) by mouth 2 (two) times daily with a meal. Patient taking differently: Take 325 mg by mouth daily. 02/02/20  Yes Brunetta Jeans, PA-C  furosemide (LASIX) 40 MG tablet Take 1 tablet (40 mg total) by mouth daily. 05/25/20  Yes [name], MD  glipiZIDE (GLUCOTROL XL) 5 MG 24 hr tablet TAKE (1) TABLET DAILY WITH BREAKFAST. Patient taking differently: Take 5 mg by mouth daily. 12/03/19  Yes Brunetta Jeans, PA-C  hydrocortisone (ANUSOL-HC) 25 MG suppository Place 1 suppository (25 mg total) rectally at bedtime for 7 days, THEN 1 suppository (25 mg total) every other day for 14 days. Patient taking differently: Place 1 suppository (25 mg total) every other day for 14 days. 06/18/20 07/09/20 Yes Kyle, Tyrone A, DO  hydrocortisone cream 1 % Apply topically 2 (two) times daily. Patient taking differently: Apply 1 application topically 2 (two) times daily. 06/18/20  Yes Kyle, Tyrone A, DO  levothyroxine  (SYNTHROID) 88 MCG tablet Take 1 tablet (88 mcg total) by mouth daily. 12/04/19  Yes Brunetta Jeans, PA-C  LORazepam (ATIVAN) 0.5 MG tablet Take 1 tablet (0.5 mg total) by mouth 2 (two) times daily as needed for anxiety. 06/06/20  Yes Brunetta Jeans, PA-C  omeprazole (PRILOSEC) 20 MG capsule Take 1 capsule (20 mg total) by mouth 2 (two) times daily. 06/18/20 07/18/20 Yes Kyle, Tyrone A, DO  propranolol (INDERAL) 40 MG tablet Take 1 tablet (40 mg total) by mouth 2 (two) times daily. 06/23/20  Yes Brunetta Jeans, PA-C  rivaroxaban (XARELTO) 20 MG TABS tablet Take 1 tablet (20 mg total) by mouth daily with supper. Resume 06/21/20 Patient taking differently: Take 20 mg by mouth daily with supper. 06/18/20  Yes Kyle, Tyrone A, DO  spironolactone (ALDACTONE) 50 MG tablet  Take 1 tablet (50 mg total) by mouth daily. 05/25/20  Yes [name], MD  sucralfate (CARAFATE) 1 g tablet Take 1 tablet (1 g total) by mouth 2 (two) times daily. Patient taking differently: Take 1 g by mouth daily. 06/18/20 07/18/20 Yes Kyle, Tyrone A, DO  telmisartan (MICARDIS) 40 MG tablet Take 0.5 tablets (20 mg total) by mouth in the morning and at bedtime. 06/18/20  Yes Kyle, Tyrone A, DO  ACCU-CHEK AVIVA PLUS test strip  10/20/18   [provider]  Blood Glucose Monitoring Suppl (ACCU-CHEK AVIVA) device Use as instructed 07/03/19 07/02/20  Brunetta Jeans, PA-C  loperamide (IMODIUM) 2 MG capsule Take 2 capsules by mouth once. Then take additional 2mg  capsule as needed for continued diarrhea. No more than 16 mg in 24 hours. Patient not taking: No sig reported 04/27/20   Brunetta Jeans, PA-C  ondansetron (ZOFRAN) 8 MG tablet Take 1 tablet (8 mg total) by mouth every 8 (eight) hours as needed for nausea or vomiting. Patient not taking: No sig reported 06/06/20   Brunetta Jeans, PA-C  polycarbophil (FIBERCON) 625 MG tablet Take 1 tablet (625 mg total) by mouth daily. Patient not taking: No sig reported 06/19/20 07/19/20   Cherylann Ratel A, DO    Allergies    Aspirin and Codeine  Review of Systems   Review of Systems  Neurological: Positive for weakness.  All other systems reviewed and are negative.   Physical Exam Updated Vital Signs BP (!) 141/52   Pulse 77   Temp 98.1 F (36.7 C) (Oral)   Resp 17   SpO2 99%   Physical Exam Vitals and nursing note reviewed.  Constitutional:      Appearance: Normal appearance. She is ill-appearing.  HENT:     Head: Normocephalic and atraumatic.     Right Ear: External ear normal.     Left Ear: External ear normal.     Nose: Nose normal.     Mouth/Throat:     Mouth: Mucous membranes are dry.  Eyes:     Extraocular Movements: Extraocular movements intact.     Pupils: Pupils are equal, round, and reactive to light.  Cardiovascular:     Rate and Rhythm: Normal rate and regular rhythm.     Pulses: Normal pulses.     Heart sounds: Normal heart sounds.  Pulmonary:     Effort: Pulmonary effort is normal.     Breath sounds: Normal breath sounds.  Abdominal:     General: Abdomen is flat. Bowel sounds are normal.     Palpations: Abdomen is soft.  Musculoskeletal:        General: Normal range of motion.     Cervical back: Normal range of motion and neck supple.  Skin:    General: Skin is warm.     Capillary Refill: Capillary refill takes less than 2 seconds.  Neurological:     General: No focal deficit present.     Mental Status: She is alert and oriented to person, place, and time.  Psychiatric:        Mood and Affect: Mood normal.        Behavior: Behavior normal.        Thought Content: Thought content normal.        Judgment: Judgment normal.     ED Results / Procedures / Treatments   Labs (all labs ordered are listed, but only abnormal results are displayed) Labs Reviewed  COMPREHENSIVE METABOLIC PANEL - Abnormal;  Notable for the following components:      Result Value   Glucose, Bld 116 (*)    BUN 49 (*)    Creatinine, Ser 1.97 (*)     Calcium 8.8 (*)    Total Protein 5.7 (*)    Albumin 2.9 (*)    GFR, Estimated 26 (*)    All other components within normal limits  PROTIME-INR - Abnormal; Notable for the following components:   Prothrombin Time 22.9 (*)    INR 2.1 (*)    All other components within normal limits  RESP PANEL BY RT-PCR (FLU A&B, COVID) ARPGX2  PREPARE RBC (CROSSMATCH)  TYPE AND SCREEN    EKG None  Radiology No results found.  Procedures Procedures (including critical care time)  Medications Ordered in ED Medications  pantoprazole (PROTONIX) 80 mg in sodium chloride 0.9 % 100 mL (0.8 mg/mL) infusion (has no administration in time range)  0.9 %  sodium chloride infusion (Manually program via Guardrails IV Fluids) ( Intravenous New Bag/Given 06/30/20 1329)  pantoprazole (PROTONIX) 80 mg in sodium chloride 0.9 % 100 mL IVPB (80 mg Intravenous New Bag/Given 06/30/20 1346)    ED Course  I have reviewed the triage vital signs and the nursing notes.  Pertinent labs & imaging results that were available during my care of the patient were reviewed by me and considered in my medical decision making (see chart for details).    MDM Rules/Calculators/A&P                          Pt's hgb has dropped 3 g in 1 week.  She is guaiac + from below.  I ordered blood for transfusion.  I also ordered protonix bolus and infusion.  Pt d/w [facility] who will see pt.  Pt d/w Dr. Neysa Bonito (triad) for admission.   CRITICAL CARE Performed by: Isla Pence   Total critical care time: 30 minutes  Critical care time was exclusive of separately billable procedures and treating other patients.  Critical care was necessary to treat or prevent imminent or life-threatening deterioration.  Critical care was time spent personally by me on the following activities: development of treatment plan with patient and/or surrogate as well as nursing, discussions with consultants, evaluation of patient's response to treatment,  examination of patient, obtaining history from patient or surrogate, ordering and performing treatments and interventions, ordering and review of laboratory studies, ordering and review of radiographic studies, pulse oximetry and re-evaluation of patient's condition.   Final Clinical Impression(s) / ED Diagnoses Final diagnoses:  Symptomatic anemia  Gastrointestinal hemorrhage, unspecified gastrointestinal hemorrhage type    Rx / DC Orders ED Discharge Orders    None       Isla Pence, MD 06/30/20 [phone]

## 2020-06-30 NOTE — ED Notes (Signed)
[phone], husbands phone number for update, Jimmy Scaletta.

## 2020-06-30 NOTE — H&P (View-Only) (Signed)
 Referring Provider: No ref. provider found Primary Care Physician:  Martin, William C, PA-C Primary Gastroenterologist:  Dr. John Perry   Reason for Consultation: Anemia, heme + stool  HPI: Cheryl Jordan is a 79 y.o. 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 Past cholecystectomy.  She was admitted to the hospital 06/15/2020 with fatigue with associated iron deficiency and heme positive stool.  Admission Hemoglobin was 8.4.  Hematocrit 28.6.  Platelet 127.  Creatinine 1.31.  INR 1.3.  She was on Xarelto secondary to a recent right lower extremity DVT.  She underwent an EGD and colonoscopy by Dr. Mansouraty on 06/17/2020.  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  The colonoscopy identified patent ileocolonic anastomosis, Six  2 to 6 mm tubular adenomatous polyps were removed from the rectum, rectosigmoid, sigmoid, descending and transverse colon. To prevent bleeding after the rectal polypectomy, two hemostatic clips were successfully placed. Nonbleeding internal and external hemorrhoids were noted.  Xarelto was held for 72 hours EGD and colonoscopy.  Prior CT imaging 04/29/2020 identified a right liver lesion concerning for HCC was reviwed with plans to further evaluate as an outpatient.  She received IV iron x 1.  Her hemoglobin level was 9.0 at the time of discharge.  She was discharged home 06/18/2020 with the instructions to use Anusol suppositories, sitz bath, Preparation H for her hemorrhoids, PPI p.o. twice daily indefinitely, Carafate twice daily, oral iron once daily to be initiated in 1 to 2 weeks and Cipro 500 mg twice daily for 3 days to complete her SBB  prophylaxis.  She was to follow-up in our office in 4 to 6 weeks.  She was seen in the office by her oncologist Dr. Sherill for her routine follow-up.  During her appointment, she complained of progressive fatigue and passing black stools with rectal bleeding.  She is on oral iron.  Her Hg level was 6.4 and she was sent directly to the ED for further evaluation. She was starting on a Pantoprazole infusion. One unit of PRBCs is currently transfusing.   She reported passing 1-4 soft to loose black stools since her hospital discharge 06/18/2020.  She started taking ferrous sulfate 325 mg 1 tab daily three or four days after she returned home.  She developed worsening fatigue and a decreased appetite over the past 2 to 3 days.  Yesterday evening, she passed a black loose stool with a moderate amount of bright red blood in the toilet bowl.  This morning she passed a similar loose black stool with a small amount of bright red blood.  No upper or lower abdominal pain.  No rectal pain.  No nausea or vomiting.  No hematemesis.  Her last dose of Xarelto was taken at 6 PM on 06/28/2020.  No aspirin or other NSAID use.  No chest pain or shortness of breath.  No further melenic stool or rectal bleeding since arriving to the ED.  No family at the bedside.  ED course 06/30/2020: Sodium 140.  Potassium 4.3.  BUN 49.  Creatinine 1.97.  Alk phos 69.  Albumin 2.9.  AST 27.  ALT 17.  Total bili 1.0.  WBC 5.9.  Hemoglobin 6.4.  Hematocrit 21.3.  MCV   104.9.  Platelet 140.  INR 2.1.    EGD 06/17/2020: - No gross lesions in esophagus proximally. Grade I and grade II esophageal varices distally. - Portal hypertensive gastropathy. 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 - Biopsied stomach for HP evaluation. - No gross lesions in the duodenal bulb, in the first portion of the duodenum and in the second portion of the duodenum. Biopsied.  Colonoscopy 06/17/2020: - Hemorrhoids  found on digital rectal exam. - The examined portion of the ileum was normal. - Patent functional end-to-end ileo-colonic anastomosis, characterized by healthy appearing mucosa, an intact staple line and visible sutures. Removal of the staple and suture was accomplished with a regular forceps. - Six 2 to 6 mm polyps in the rectum, at the recto-sigmoid colon, in the sigmoid colon, in the descending colon and in the transverse colon, removed with a cold snare. Resected and retrieved. -To prevent bleeding after the rectal polypectomy, two hemostatic clips were successfully placed - Erythematous mucosa in the mid rectum and in the distal rectum. Biopsied. - Normal mucosa in the entire examined colon otherwise. - Non-bleeding non-thrombosed external and internal hemorrhoids.  Biopsy report: A. DUODENUM, BIOPSY:  - Unremarkable duodenal mucosa.  - No features of celiac sprue or granulomas.   B. STOMACH, BIOPSY:  - Unremarkable gastric mucosa.  - Warthin-Starry negative for Helicobacter pylori.  - No intestinal metaplasia, dysplasia or carcinoma.   C. COLON, TRANSVERSE, DESCENDING, SIGMOID, RECTUM, POLYPECTOMY:  - Tubular adenoma (1).  - No high-grade dysplasia or carcinoma.  - Hyperplastic polyp (s).   D. RECTUM, BIOPSY:  - Unremarkable colonic mucosa with benign lymphoid aggregates.  - No active inflammation, chronic changes or granulomas.   Paracentesis 05/11/2020: Successful ultrasound-guided paracentesis yielding 3.2 liters of peritoneal fluid. No SBP.  Past Medical History:  Diagnosis Date  . Allergy   . Anemia    hx of  . Anxiety   . Arthritis   . Colon cancer (HCC) dx'd 11/2014  . Diabetes mellitus without complication (HCC)    TYPE II  . Fatty liver   . Gallbladder disease   . GERD (gastroesophageal reflux disease)   . History of gout   . Hypertension   . Hypothyroidism   . Kidney disorder    spot on left kidney  . Osteoporosis   . PONV (postoperative nausea and  vomiting)     Past Surgical History:  Procedure Laterality Date  . BIOPSY  06/17/2020   Procedure: BIOPSY;  Surgeon: Mansouraty, Gabriel Jr., MD;  Location: [facility];  Service: Gastroenterology;;  . CHOLECYSTECTOMY    . COLONOSCOPY WITH PROPOFOL N/A 06/17/2020   Procedure: COLONOSCOPY WITH PROPOFOL;  Surgeon: Mansouraty, Gabriel Jr., MD;  Location: [facility];  Service: Gastroenterology;  Laterality: N/A;  . ESOPHAGOGASTRODUODENOSCOPY (EGD) WITH PROPOFOL N/A 06/17/2020   Procedure: ESOPHAGOGASTRODUODENOSCOPY (EGD) WITH PROPOFOL;  Surgeon: Mansouraty, Gabriel Jr., MD;  Location: [facility];  Service: Gastroenterology;  Laterality: N/A;  . FOREIGN BODY REMOVAL  06/17/2020   Procedure: FOREIGN BODY REMOVAL;  Surgeon: Mansouraty, Gabriel Jr., MD;  Location: [facility];  Service: Gastroenterology;;  . GI RADIOFREQUENCY ABLATION  06/17/2020   Procedure: GI RADIOFREQUENCY ABLATION;  Surgeon: Mansouraty, Gabriel Jr., MD;  Location: [facility];  Service: Gastroenterology;;  . HEMOSTASIS CLIP PLACEMENT  06/17/2020   Procedure: HEMOSTASIS CLIP PLACEMENT;  Surgeon: Mansouraty, Gabriel Jr., MD;  Location: [facility];  Service: Gastroenterology;;  . IR RADIOLOGIST EVAL & MGMT  01/24/2017  . KNEE CARTILAGE SURGERY Bilateral   .   NEPHRECTOMY Left 02/10/2015   Procedure: OPEN RETROPERINTONEAL EXPLORATION LEFT RENAL  CYST DECORTICATION X 5;  Surgeon: Cleon Gustin, MD;  Location: [facility];  Service: Urology;  Laterality: Left;  . PARTIAL COLECTOMY N/A 02/10/2015   Procedure: OPEN RIGHT  COLECTOMY ;  Surgeon: [name], MD;  Location: [facility];  Service: General;  Laterality: N/A;  . POLYPECTOMY  06/17/2020   Procedure: POLYPECTOMY;  Surgeon: Irving Copas., MD;  Location: [facility];  Service: Gastroenterology;;  . TUBAL LIGATION      Prior to Admission medications   Medication Sig Start Date End Date Taking? Authorizing Provider  ACCU-CHEK AVIVA PLUS test strip  10/20/18   [provider]  acetaminophen (TYLENOL) 500 MG tablet Take 500 mg by mouth every 6 (six) hours as needed for moderate pain.    [provider]  amLODipine (NORVASC) 2.5 MG tablet Take 1 tablet (2.5 mg total) by mouth daily. 12/04/19   Brunetta Jeans, PA-C  Blood Glucose Monitoring Suppl (ACCU-CHEK AVIVA) device Use as instructed 07/03/19 07/02/20  Brunetta Jeans, PA-C  ferrous sulfate 325 (65 FE) MG EC tablet Take 1 tablet (325 mg total) by mouth 2 (two) times daily with a meal. Patient taking differently: Take 325 mg by mouth daily. 02/02/20   Brunetta Jeans, PA-C  furosemide (LASIX) 40 MG tablet Take 1 tablet (40 mg total) by mouth daily. 05/25/20   [name], MD  glipiZIDE (GLUCOTROL XL) 5 MG 24 hr tablet TAKE (1) TABLET DAILY WITH BREAKFAST. Patient taking differently: Take 5 mg by mouth daily. 12/03/19   Brunetta Jeans, PA-C  hydrocortisone (ANUSOL-HC) 25 MG suppository Place 1 suppository (25 mg total) rectally at bedtime for 7 days, THEN 1 suppository (25 mg total) every other day for 14 days. 06/18/20 07/09/20  Cherylann Ratel A, DO  hydrocortisone cream 1 % Apply topically 2 (two) times daily. 06/18/20   Cherylann Ratel A, DO  Hydrocortisone, Perianal, 1 % CREA Apply topically. 06/18/20   [provider]  levothyroxine (SYNTHROID) 88 MCG tablet Take 1 tablet (88 mcg total) by mouth daily. 12/04/19   Brunetta Jeans, PA-C  loperamide (IMODIUM) 2 MG capsule Take 2 capsules by mouth once. Then take additional 77m capsule as needed for continued diarrhea. No more than 16 mg in 24 hours. Patient not taking: Reported on 06/23/2020 04/27/20   MBrunetta Jeans PA-C  LORazepam (ATIVAN) 0.5 MG tablet Take 1 tablet (0.5 mg total) by mouth 2 (two) times daily as needed for anxiety. 06/06/20   MBrunetta Jeans PA-C  omeprazole (PRILOSEC) 20 MG capsule Take 1 capsule (20 mg total) by mouth 2 (two) times daily. 06/18/20 07/18/20  KCherylann RatelA, DO  ondansetron (ZOFRAN) 8 MG tablet  Take 1 tablet (8 mg total) by mouth every 8 (eight) hours as needed for nausea or vomiting. Patient not taking: Reported on 06/23/2020 06/06/20   MBrunetta Jeans PA-C  polycarbophil (FIBERCON) 625 MG tablet Take 1 tablet (625 mg total) by mouth daily. 06/19/20 07/19/20  KCherylann RatelA, DO  propranolol (INDERAL) 40 MG tablet Take 1 tablet (40 mg total) by mouth 2 (two) times daily. 06/23/20   MBrunetta Jeans PA-C  rivaroxaban (XARELTO) 20 MG TABS tablet Take 1 tablet (20 mg total) by mouth daily with supper. Resume 06/21/20 06/18/20   KCherylann RatelA, DO  spironolactone (ALDACTONE) 50 MG tablet Take 1 tablet (50 mg total) by mouth daily. 05/25/20   PScarlette Shorts  N, MD  sucralfate (CARAFATE) 1 g tablet Take 1 tablet (1 g total) by mouth 2 (two) times daily. 06/18/20 07/18/20  Cherylann Ratel A, DO  telmisartan (MICARDIS) 40 MG tablet Take 0.5 tablets (20 mg total) by mouth in the morning and at bedtime. 06/18/20   Cherylann Ratel A, DO    Current Facility-Administered Medications  Medication Dose Route Frequency Provider Last Rate Last Admin  . pantoprazole (PROTONIX) 80 mg in sodium chloride 0.9 % 100 mL (0.8 mg/mL) infusion  8 mg/hr Intravenous Continuous Isla Pence, MD      . pantoprazole (PROTONIX) 80 mg in sodium chloride 0.9 % 100 mL IVPB  80 mg Intravenous Once Isla Pence, MD       Current Outpatient Medications  Medication Sig Dispense Refill  . ACCU-CHEK AVIVA PLUS test strip     . acetaminophen (TYLENOL) 500 MG tablet Take 500 mg by mouth every 6 (six) hours as needed for moderate pain.    Marland Kitchen amLODipine (NORVASC) 2.5 MG tablet Take 1 tablet (2.5 mg total) by mouth daily. 90 tablet 2  . Blood Glucose Monitoring Suppl (ACCU-CHEK AVIVA) device Use as instructed 1 each 0  . ferrous sulfate 325 (65 FE) MG EC tablet Take 1 tablet (325 mg total) by mouth 2 (two) times daily with a meal. (Patient taking differently: Take 325 mg by mouth daily.) 90 tablet 1  . furosemide (LASIX) 40 MG tablet Take 1  tablet (40 mg total) by mouth daily. 30 tablet 6  . glipiZIDE (GLUCOTROL XL) 5 MG 24 hr tablet TAKE (1) TABLET DAILY WITH BREAKFAST. (Patient taking differently: Take 5 mg by mouth daily.) 90 tablet 1  . hydrocortisone (ANUSOL-HC) 25 MG suppository Place 1 suppository (25 mg total) rectally at bedtime for 7 days, THEN 1 suppository (25 mg total) every other day for 14 days. 14 suppository 0  . hydrocortisone cream 1 % Apply topically 2 (two) times daily. 30 g 0  . Hydrocortisone, Perianal, 1 % CREA Apply topically.    Marland Kitchen levothyroxine (SYNTHROID) 88 MCG tablet Take 1 tablet (88 mcg total) by mouth daily. 90 tablet 2  . loperamide (IMODIUM) 2 MG capsule Take 2 capsules by mouth once. Then take additional 67m capsule as needed for continued diarrhea. No more than 16 mg in 24 hours. (Patient not taking: Reported on 06/23/2020) 30 capsule 0  . LORazepam (ATIVAN) 0.5 MG tablet Take 1 tablet (0.5 mg total) by mouth 2 (two) times daily as needed for anxiety. 30 tablet 0  . omeprazole (PRILOSEC) 20 MG capsule Take 1 capsule (20 mg total) by mouth 2 (two) times daily. 60 capsule 0  . ondansetron (ZOFRAN) 8 MG tablet Take 1 tablet (8 mg total) by mouth every 8 (eight) hours as needed for nausea or vomiting. (Patient not taking: Reported on 06/23/2020) 20 tablet 0  . polycarbophil (FIBERCON) 625 MG tablet Take 1 tablet (625 mg total) by mouth daily. 30 tablet 0  . propranolol (INDERAL) 40 MG tablet Take 1 tablet (40 mg total) by mouth 2 (two) times daily. 180 tablet 1  . rivaroxaban (XARELTO) 20 MG TABS tablet Take 1 tablet (20 mg total) by mouth daily with supper. Resume 06/21/20 90 tablet 0  . spironolactone (ALDACTONE) 50 MG tablet Take 1 tablet (50 mg total) by mouth daily. 30 tablet 6  . sucralfate (CARAFATE) 1 g tablet Take 1 tablet (1 g total) by mouth 2 (two) times daily. 60 tablet 0  . telmisartan (MICARDIS) 40 MG tablet  Take 0.5 tablets (20 mg total) by mouth in the morning and at bedtime.       Allergies as of 06/30/2020 - Review Complete 06/30/2020  Allergen Reaction Noted  . Aspirin Other (See Comments) 10/16/2011  . Codeine Nausea And Vomiting 10/16/2011    Family History  Problem Relation Age of Onset  . Diabetes Mother   . Hypertension Other   . COPD Father   . Hypertension Sister   . GER disease Sister   . Heart attack Brother   . COPD Brother   . Lung cancer Brother   . COPD Brother   . Hypertension Brother     Social History   Socioeconomic History  . Marital status: Married    Spouse name: Not on file  . Number of children: 2  . Years of education: Not on file  . Highest education level: Not on file  Occupational History  . Occupation: Retired  Tobacco Use  . Smoking status: Never Smoker  . Smokeless tobacco: Never Used  Vaping Use  . Vaping Use: Never used  Substance and Sexual Activity  . Alcohol use: No  . Drug use: No  . Sexual activity: Never  Other Topics Concern  . Not on file  Social History Narrative   03/03/15-Married, husband Jeneen Rinks   #2 grown sons and #2 grand daughters   Social Determinants of Health   Financial Resource Strain: Low Risk   . Difficulty of Paying Living Expenses: Not hard at all  Food Insecurity: No Food Insecurity  . Worried About Charity fundraiser in the Last Year: Never true  . Ran Out of Food in the Last Year: Never true  Transportation Needs: No Transportation Needs  . Lack of Transportation (Medical): No  . Lack of Transportation (Non-Medical): No  Physical Activity: Inactive  . Days of Exercise per Week: 0 days  . Minutes of Exercise per Session: 0 min  Stress: No Stress Concern Present  . Feeling of Stress : Not at all  Social Connections: Socially Integrated  . Frequency of Communication with Friends and Family: More than three times a week  . Frequency of Social Gatherings with Friends and Family: More than three times a week  . Attends Religious Services: More than 4 times per year  .  Active Member of Clubs or Organizations: Yes  . Attends Archivist Meetings: More than 4 times per year  . Marital Status: Married  Human resources officer Violence: Not At Risk  . Fear of Current or Ex-Partner: No  . Emotionally Abused: No  . Physically Abused: No  . Sexually Abused: No    Review of Systems: Gen: Denies fever, sweats or chills. No weight loss.  CV: Denies chest pain, palpitations or edema. Resp: Denies cough, shortness of breath of hemoptysis.  GI: See HPI. GU : Denies urinary burning, blood in urine, increased urinary frequency or incontinence. MS: Denies joint pain, muscles aches or weakness. Derm: Denies rash, itchiness, skin lesions or unhealing ulcers. Psych: Denies depression, anxiety, memory loss or confusion. Heme: Denies easy bruising, bleeding. Neuro:  Denies headaches, dizziness or paresthesias. Endo:  + DM II.  Physical Exam: Vital signs in last 24 hours: Temp:  [98.1 F (36.7 C)-98.7 F (37.1 C)] 98.1 F (36.7 C) (12/16 1246) Pulse Rate:  [73-87] 73 (12/16 1246) Resp:  [17-21] 21 (12/16 1246) BP: (152-160)/(56-57) 160/57 (12/16 1246) SpO2:  [98 %-100 %] 98 % (12/16 1253) Weight:  [94.7 kg] 94.7 kg (12/16 1042)  General:  Alert fatigued appearing 79-year-old female in no acute distress. Head:  Normocephalic and atraumatic. Eyes:  No scleral icterus. Conjunctiva pink. Ears:  Normal auditory acuity. Nose:  No deformity, discharge or lesions. Mouth:  Dentition intact.  Upper partial plate in use.  No ulcers or lesions.  Neck:  Supple. No lymphadenopathy or thyromegaly.  Lungs: Sounds clear throughout. Heart: Regular rate and rhythm.  No murmur. Abdomen: Obese abdomen, soft, nondistended.  Nontender.  Questionable small amount of ascites. Rectal: Inflamed anal hemorrhoids, a Hemoclip was loose and found 1cm from the anal verge. A small amount of bright red blood on exam glove without melena. No stool.  Musculoskeletal:  Symmetrical without  gross deformities.  Pulses:  Normal pulses noted. Extremities:  1+ pitting edema Neurologic:  Alert and  oriented x4. No focal deficits.  Skin:  Intact without significant lesions or rashes. Psych:  Alert and cooperative. Normal mood and affect.  Intake/Output from previous day: No intake/output data recorded. Intake/Output this shift: No intake/output data recorded.  Lab Results: Recent Labs    06/30/20 1150  WBC 5.9  HGB 6.4*  HCT 21.3*  PLT 140*   BMET No results for input(s): NA, K, CL, CO2, GLUCOSE, BUN, CREATININE, CALCIUM in the last 72 hours. LFT No results for input(s): PROT, ALBUMIN, AST, ALT, ALKPHOS, BILITOT, BILIDIR, IBILI in the last 72 hours. PT/INR Recent Labs    06/30/20 1301  LABPROT 22.9*  INR 2.1*   Hepatitis Panel No results for input(s): HEPBSAG, HCVAB, HEPAIGM, HEPBIGM in the last 72 hours.    Studies/Results: No results found.  IMPRESSION/PLAN:  1.  79-year-old female with cirrhosis likely NASH readmitted to the hospital 12/16 with profound iron deficiency anemia with melena and BRBPR.  Admission Hg 6.4.  1 unit of PRBCs currently transfusing ( 2 units ordered).  Previously admitted to the hospital 12/1 with anemia and GI heme + stool. 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  The colonoscopy identified patent ileocolonic anastomosis with intact staple line and visible sutures, Six  2 to 6 mm polyps were removed from the rectum, rectosigmoid, sigmoid, descending and transverse colon and two hemostatic clips were successfully placed.  Nonbleeding internal and external hemorrhoids were noted. Suspect recurrent UGI bleeding from gastric AVMs and lower GI bleeding from polypectomy site with hemoclip found loose on rectal exam. -Check H&H posttransfusion -Clear liquid diet -N.p.o. after midnight -EGD and colonoscopy tomorrow with Dr. Willia Genrich. -Continue  pantoprazole 8 mg/hour infusion -Octreotide bolus followed by continuous infusion -Rocephin 1 g IV every 24 hours for SBP prophylaxis in the setting of GI bleeding -Defer Propanolol and diuretic recommendations to Dr. Gearl Baratta -CBC, CMP in a.m.  2.  History of cecal colon cancer stage IIIc status post resection July 2016 followed by adjunctive chemotherapy.  3. RLE DVT 05/10/2020 on Xarelto.  Last dose of Xarelto was taken at 6 PM on 12/14. -Hold Xarelto  4. A 2.8 x 2.5cm complex hyperdense right liver lesion per CTAP 04/29/2020, concerning for HCC. -Consider abdominal MRI during this admission versus as an outpatient  5.  DM II  Further recommendations per Dr. Bertrand Vowels   Colleen M Kennedy-Smith  06/30/2020, 1:36 PM    Attending physician's note   I have taken a history, examined the patient and reviewed the chart. I agree with the Advanced Practitioner's note, impression and recommendations.  79-year-old very pleasant female with multiple comorbidities, diabetes, hypertension,   Nash cirrhosis, adenocarcinoma of cecum s/p right hemicolectomy, DVT on chronic anticoagulation with Xarelto presented with melena and hematochezia  Acute drop in hemoglobin to 6.4  She underwent EGD and colonoscopy June 17, 2020 with RFA of gastric antrum for GAVE.  Small esophageal varices she had anastomotic visible suture/staple removed during colonoscopy along with polypectomies  Likely etiology of GI bleed could be site of RFA in gastric antrum, ileocecal anastomosis with disruption of staple line or prior polypectomy sites  We will plan to proceed with repeat EGD and colonoscopy to evaluate and endoscopically intervene if needed  Clear liquids, bowel prep Hold Xarelto  The risks and benefits as well as alternatives of endoscopic procedure(s) have been discussed and reviewed. All questions answered. The patient agrees to proceed.    The patient was provided an opportunity to ask questions and  all were answered. The patient agreed with the plan and demonstrated an understanding of the instructions.  Damaris Hippo , MD [phone]

## 2020-06-30 NOTE — ED Notes (Signed)
Updated son regarding plan of care [phone]

## 2020-06-30 NOTE — Consult Note (Addendum)
Referring Provider: No ref. provider found Primary Care Physician:  Brunetta Jeans, PA-C Primary Gastroenterologist:  Dr. Scarlette Shorts   Reason for Consultation: Anemia, heme + stool  HPI: Cheryl Jordan is a 79 y.o. 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 Past cholecystectomy.  She was admitted to the hospital 06/15/2020 with fatigue with associated iron deficiency and heme positive stool.  Admission Hemoglobin was 8.4.  Hematocrit 28.6.  Platelet 127.  Creatinine 1.31.  INR 1.3.  She was on Xarelto secondary to a recent right lower extremity DVT.  She underwent an EGD and colonoscopy by Dr. Rush Landmark on 06/17/2020.  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  The colonoscopy identified patent ileocolonic anastomosis, Six  2 to 6 mm tubular adenomatous polyps were removed from the rectum, rectosigmoid, sigmoid, descending and transverse colon. To prevent bleeding after the rectal polypectomy, two hemostatic clips were successfully placed. Nonbleeding internal and external hemorrhoids were noted.  Xarelto was held for 72 hours EGD and colonoscopy.  Prior CT imaging 04/29/2020 identified a right liver lesion concerning for [facility] was reviwed with plans to further evaluate as an outpatient.  She received IV iron x 1.  Her hemoglobin level was 9.0 at the time of discharge.  She was discharged home 06/18/2020 with the instructions to use Anusol suppositories, sitz bath, Preparation H for her hemorrhoids, PPI p.o. twice daily indefinitely, Carafate twice daily, oral iron once daily to be initiated in 1 to 2 weeks and Cipro 500 mg twice daily for 3 days to complete her SBB  prophylaxis.  She was to follow-up in our office in 4 to 6 weeks.  She was seen in the office by her oncologist Dr. Learta Codding for her routine follow-up.  During her appointment, she complained of progressive fatigue and passing black stools with rectal bleeding.  She is on oral iron.  Her Hg level was 6.4 and she was sent directly to the ED for further evaluation. She was starting on a Pantoprazole infusion. One unit of PRBCs is currently transfusing.   She reported passing 1-4 soft to loose black stools since her hospital discharge 06/18/2020.  She started taking ferrous sulfate 325 mg 1 tab daily three or four days after she returned home.  She developed worsening fatigue and a decreased appetite over the past 2 to 3 days.  Yesterday evening, she passed a black loose stool with a moderate amount of bright red blood in the toilet bowl.  This morning she passed a similar loose black stool with a small amount of bright red blood.  No upper or lower abdominal pain.  No rectal pain.  No nausea or vomiting.  No hematemesis.  Her last dose of Xarelto was taken at 6 PM on 06/28/2020.  No aspirin or other NSAID use.  No chest pain or shortness of breath.  No further melenic stool or rectal bleeding since arriving to the ED.  No family at the bedside.  ED course 06/30/2020: Sodium 140.  Potassium 4.3.  BUN 49.  Creatinine 1.97.  Alk phos 69.  Albumin 2.9.  AST 27.  ALT 17.  Total bili 1.0.  WBC 5.9.  Hemoglobin 6.4.  Hematocrit 21.3.  MCV  104.9.  Platelet 140.  INR 2.1.    EGD 06/17/2020: - No gross lesions in esophagus proximally. Grade I and grade II esophageal varices distally. - Portal hypertensive gastropathy. 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 - Biopsied stomach for HP evaluation. - No gross lesions in the duodenal bulb, in the first portion of the duodenum and in the second portion of the duodenum. Biopsied.  Colonoscopy 06/17/2020: - Hemorrhoids  found on digital rectal exam. - The examined portion of the ileum was normal. - Patent functional end-to-end ileo-colonic anastomosis, characterized by healthy appearing mucosa, an intact staple line and visible sutures. Removal of the staple and suture was accomplished with a regular forceps. - Six 2 to 6 mm polyps in the rectum, at the recto-sigmoid colon, in the sigmoid colon, in the descending colon and in the transverse colon, removed with a cold snare. Resected and retrieved. -To prevent bleeding after the rectal polypectomy, two hemostatic clips were successfully placed - Erythematous mucosa in the mid rectum and in the distal rectum. Biopsied. - Normal mucosa in the entire examined colon otherwise. - Non-bleeding non-thrombosed external and internal hemorrhoids.  Biopsy report: A. DUODENUM, BIOPSY:  - Unremarkable duodenal mucosa.  - No features of celiac sprue or granulomas.   B. STOMACH, BIOPSY:  - Unremarkable gastric mucosa.  - Warthin-Starry negative for Helicobacter pylori.  - No intestinal metaplasia, dysplasia or carcinoma.   C. COLON, TRANSVERSE, DESCENDING, SIGMOID, RECTUM, POLYPECTOMY:  - Tubular adenoma (1).  - No high-grade dysplasia or carcinoma.  - Hyperplastic polyp (s).   D. RECTUM, BIOPSY:  - Unremarkable colonic mucosa with benign lymphoid aggregates.  - No active inflammation, chronic changes or granulomas.   Paracentesis 05/11/2020: Successful ultrasound-guided paracentesis yielding 3.2 liters of peritoneal fluid. No SBP.  Past Medical History:  Diagnosis Date  . Allergy   . Anemia    hx of  . Anxiety   . Arthritis   . Colon cancer ([place]) dx'd 11/2014  . Diabetes mellitus without complication ([place])    TYPE II  . Fatty liver   . Gallbladder disease   . GERD (gastroesophageal reflux disease)   . History of gout   . Hypertension   . Hypothyroidism   . Kidney disorder    spot on left kidney  . Osteoporosis   . PONV (postoperative nausea and  vomiting)     Past Surgical History:  Procedure Laterality Date  . BIOPSY  06/17/2020   Procedure: BIOPSY;  Surgeon: Rush Landmark Telford Nab., MD;  Location: [facility];  Service: Gastroenterology;;  . CHOLECYSTECTOMY    . COLONOSCOPY WITH PROPOFOL N/A 06/17/2020   Procedure: COLONOSCOPY WITH PROPOFOL;  Surgeon: Rush Landmark Telford Nab., MD;  Location: [facility];  Service: Gastroenterology;  Laterality: N/A;  . ESOPHAGOGASTRODUODENOSCOPY (EGD) WITH PROPOFOL N/A 06/17/2020   Procedure: ESOPHAGOGASTRODUODENOSCOPY (EGD) WITH PROPOFOL;  Surgeon: Rush Landmark Telford Nab., MD;  Location: [facility];  Service: Gastroenterology;  Laterality: N/A;  . FOREIGN BODY REMOVAL  06/17/2020   Procedure: FOREIGN BODY REMOVAL;  Surgeon: Rush Landmark Telford Nab., MD;  Location: [facility];  Service: Gastroenterology;;  . GI RADIOFREQUENCY ABLATION  06/17/2020   Procedure: GI RADIOFREQUENCY ABLATION;  Surgeon: Irving Copas., MD;  Location: [facility];  Service: Gastroenterology;;  . HEMOSTASIS CLIP PLACEMENT  06/17/2020   Procedure: HEMOSTASIS CLIP PLACEMENT;  Surgeon: Irving Copas., MD;  Location: [facility];  Service: Gastroenterology;;  . IR RADIOLOGIST EVAL & MGMT  01/24/2017  . KNEE CARTILAGE SURGERY Bilateral   .  NEPHRECTOMY Left 02/10/2015   Procedure: OPEN RETROPERINTONEAL EXPLORATION LEFT RENAL  CYST DECORTICATION X 5;  Surgeon: Cleon Gustin, MD;  Location: [facility];  Service: Urology;  Laterality: Left;  . PARTIAL COLECTOMY N/A 02/10/2015   Procedure: OPEN RIGHT  COLECTOMY ;  Surgeon: [name], MD;  Location: [facility];  Service: General;  Laterality: N/A;  . POLYPECTOMY  06/17/2020   Procedure: POLYPECTOMY;  Surgeon: Irving Copas., MD;  Location: [facility];  Service: Gastroenterology;;  . TUBAL LIGATION      Prior to Admission medications   Medication Sig Start Date End Date Taking? Authorizing Provider  ACCU-CHEK AVIVA PLUS test strip  10/20/18   [provider]  acetaminophen (TYLENOL) 500 MG tablet Take 500 mg by mouth every 6 (six) hours as needed for moderate pain.    [provider]  amLODipine (NORVASC) 2.5 MG tablet Take 1 tablet (2.5 mg total) by mouth daily. 12/04/19   Brunetta Jeans, PA-C  Blood Glucose Monitoring Suppl (ACCU-CHEK AVIVA) device Use as instructed 07/03/19 07/02/20  Brunetta Jeans, PA-C  ferrous sulfate 325 (65 FE) MG EC tablet Take 1 tablet (325 mg total) by mouth 2 (two) times daily with a meal. Patient taking differently: Take 325 mg by mouth daily. 02/02/20   Brunetta Jeans, PA-C  furosemide (LASIX) 40 MG tablet Take 1 tablet (40 mg total) by mouth daily. 05/25/20   [name], MD  glipiZIDE (GLUCOTROL XL) 5 MG 24 hr tablet TAKE (1) TABLET DAILY WITH BREAKFAST. Patient taking differently: Take 5 mg by mouth daily. 12/03/19   Brunetta Jeans, PA-C  hydrocortisone (ANUSOL-HC) 25 MG suppository Place 1 suppository (25 mg total) rectally at bedtime for 7 days, THEN 1 suppository (25 mg total) every other day for 14 days. 06/18/20 07/09/20  Cherylann Ratel A, DO  hydrocortisone cream 1 % Apply topically 2 (two) times daily. 06/18/20   Cherylann Ratel A, DO  Hydrocortisone, Perianal, 1 % CREA Apply topically. 06/18/20   [provider]  levothyroxine (SYNTHROID) 88 MCG tablet Take 1 tablet (88 mcg total) by mouth daily. 12/04/19   Brunetta Jeans, PA-C  loperamide (IMODIUM) 2 MG capsule Take 2 capsules by mouth once. Then take additional 77m capsule as needed for continued diarrhea. No more than 16 mg in 24 hours. Patient not taking: Reported on 06/23/2020 04/27/20   MBrunetta Jeans PA-C  LORazepam (ATIVAN) 0.5 MG tablet Take 1 tablet (0.5 mg total) by mouth 2 (two) times daily as needed for anxiety. 06/06/20   MBrunetta Jeans PA-C  omeprazole (PRILOSEC) 20 MG capsule Take 1 capsule (20 mg total) by mouth 2 (two) times daily. 06/18/20 07/18/20  KCherylann RatelA, DO  ondansetron (ZOFRAN) 8 MG tablet  Take 1 tablet (8 mg total) by mouth every 8 (eight) hours as needed for nausea or vomiting. Patient not taking: Reported on 06/23/2020 06/06/20   MBrunetta Jeans PA-C  polycarbophil (FIBERCON) 625 MG tablet Take 1 tablet (625 mg total) by mouth daily. 06/19/20 07/19/20  KCherylann RatelA, DO  propranolol (INDERAL) 40 MG tablet Take 1 tablet (40 mg total) by mouth 2 (two) times daily. 06/23/20   MBrunetta Jeans PA-C  rivaroxaban (XARELTO) 20 MG TABS tablet Take 1 tablet (20 mg total) by mouth daily with supper. Resume 06/21/20 06/18/20   KCherylann RatelA, DO  spironolactone (ALDACTONE) 50 MG tablet Take 1 tablet (50 mg total) by mouth daily. 05/25/20   PScarlette Shorts  N, MD  sucralfate (CARAFATE) 1 g tablet Take 1 tablet (1 g total) by mouth 2 (two) times daily. 06/18/20 07/18/20  Cherylann Ratel A, DO  telmisartan (MICARDIS) 40 MG tablet Take 0.5 tablets (20 mg total) by mouth in the morning and at bedtime. 06/18/20   Cherylann Ratel A, DO    Current Facility-Administered Medications  Medication Dose Route Frequency Provider Last Rate Last Admin  . pantoprazole (PROTONIX) 80 mg in sodium chloride 0.9 % 100 mL (0.8 mg/mL) infusion  8 mg/hr Intravenous Continuous Isla Pence, MD      . pantoprazole (PROTONIX) 80 mg in sodium chloride 0.9 % 100 mL IVPB  80 mg Intravenous Once Isla Pence, MD       Current Outpatient Medications  Medication Sig Dispense Refill  . ACCU-CHEK AVIVA PLUS test strip     . acetaminophen (TYLENOL) 500 MG tablet Take 500 mg by mouth every 6 (six) hours as needed for moderate pain.    Marland Kitchen amLODipine (NORVASC) 2.5 MG tablet Take 1 tablet (2.5 mg total) by mouth daily. 90 tablet 2  . Blood Glucose Monitoring Suppl (ACCU-CHEK AVIVA) device Use as instructed 1 each 0  . ferrous sulfate 325 (65 FE) MG EC tablet Take 1 tablet (325 mg total) by mouth 2 (two) times daily with a meal. (Patient taking differently: Take 325 mg by mouth daily.) 90 tablet 1  . furosemide (LASIX) 40 MG tablet Take 1  tablet (40 mg total) by mouth daily. 30 tablet 6  . glipiZIDE (GLUCOTROL XL) 5 MG 24 hr tablet TAKE (1) TABLET DAILY WITH BREAKFAST. (Patient taking differently: Take 5 mg by mouth daily.) 90 tablet 1  . hydrocortisone (ANUSOL-HC) 25 MG suppository Place 1 suppository (25 mg total) rectally at bedtime for 7 days, THEN 1 suppository (25 mg total) every other day for 14 days. 14 suppository 0  . hydrocortisone cream 1 % Apply topically 2 (two) times daily. 30 g 0  . Hydrocortisone, Perianal, 1 % CREA Apply topically.    Marland Kitchen levothyroxine (SYNTHROID) 88 MCG tablet Take 1 tablet (88 mcg total) by mouth daily. 90 tablet 2  . loperamide (IMODIUM) 2 MG capsule Take 2 capsules by mouth once. Then take additional 67m capsule as needed for continued diarrhea. No more than 16 mg in 24 hours. (Patient not taking: Reported on 06/23/2020) 30 capsule 0  . LORazepam (ATIVAN) 0.5 MG tablet Take 1 tablet (0.5 mg total) by mouth 2 (two) times daily as needed for anxiety. 30 tablet 0  . omeprazole (PRILOSEC) 20 MG capsule Take 1 capsule (20 mg total) by mouth 2 (two) times daily. 60 capsule 0  . ondansetron (ZOFRAN) 8 MG tablet Take 1 tablet (8 mg total) by mouth every 8 (eight) hours as needed for nausea or vomiting. (Patient not taking: Reported on 06/23/2020) 20 tablet 0  . polycarbophil (FIBERCON) 625 MG tablet Take 1 tablet (625 mg total) by mouth daily. 30 tablet 0  . propranolol (INDERAL) 40 MG tablet Take 1 tablet (40 mg total) by mouth 2 (two) times daily. 180 tablet 1  . rivaroxaban (XARELTO) 20 MG TABS tablet Take 1 tablet (20 mg total) by mouth daily with supper. Resume 06/21/20 90 tablet 0  . spironolactone (ALDACTONE) 50 MG tablet Take 1 tablet (50 mg total) by mouth daily. 30 tablet 6  . sucralfate (CARAFATE) 1 g tablet Take 1 tablet (1 g total) by mouth 2 (two) times daily. 60 tablet 0  . telmisartan (MICARDIS) 40 MG tablet  Take 0.5 tablets (20 mg total) by mouth in the morning and at bedtime.       Allergies as of 06/30/2020 - Review Complete 06/30/2020  Allergen Reaction Noted  . Aspirin Other (See Comments) 10/16/2011  . Codeine Nausea And Vomiting 10/16/2011    Family History  Problem Relation Age of Onset  . Diabetes Mother   . Hypertension Other   . COPD Father   . Hypertension Sister   . GER disease Sister   . Heart attack Brother   . COPD Brother   . Lung cancer Brother   . COPD Brother   . Hypertension Brother     Social History   Socioeconomic History  . Marital status: Married    Spouse name: Not on file  . Number of children: 2  . Years of education: Not on file  . Highest education level: Not on file  Occupational History  . Occupation: Retired  Tobacco Use  . Smoking status: Never Smoker  . Smokeless tobacco: Never Used  Vaping Use  . Vaping Use: Never used  Substance and Sexual Activity  . Alcohol use: No  . Drug use: No  . Sexual activity: Never  Other Topics Concern  . Not on file  Social History Narrative   03/03/15-Married, husband Jeneen Rinks   #2 grown sons and #2 grand daughters   Social Determinants of Health   Financial Resource Strain: Low Risk   . Difficulty of Paying Living Expenses: Not hard at all  Food Insecurity: No Food Insecurity  . Worried About Charity fundraiser in the Last Year: Never true  . Ran Out of Food in the Last Year: Never true  Transportation Needs: No Transportation Needs  . Lack of Transportation (Medical): No  . Lack of Transportation (Non-Medical): No  Physical Activity: Inactive  . Days of Exercise per Week: 0 days  . Minutes of Exercise per Session: 0 min  Stress: No Stress Concern Present  . Feeling of Stress : Not at all  Social Connections: Socially Integrated  . Frequency of Communication with Friends and Family: More than three times a week  . Frequency of Social Gatherings with Friends and Family: More than three times a week  . Attends Religious Services: More than 4 times per year  .  Active Member of Clubs or Organizations: Yes  . Attends Archivist Meetings: More than 4 times per year  . Marital Status: Married  Human resources officer Violence: Not At Risk  . Fear of Current or Ex-Partner: No  . Emotionally Abused: No  . Physically Abused: No  . Sexually Abused: No    Review of Systems: Gen: Denies fever, sweats or chills. No weight loss.  CV: Denies chest pain, palpitations or edema. Resp: Denies cough, shortness of breath of hemoptysis.  GI: See HPI. GU : Denies urinary burning, blood in urine, increased urinary frequency or incontinence. MS: Denies joint pain, muscles aches or weakness. Derm: Denies rash, itchiness, skin lesions or unhealing ulcers. Psych: Denies depression, anxiety, memory loss or confusion. Heme: Denies easy bruising, bleeding. Neuro:  Denies headaches, dizziness or paresthesias. Endo:  + DM II.  Physical Exam: Vital signs in last 24 hours: Temp:  [98.1 F (36.7 C)-98.7 F (37.1 C)] 98.1 F (36.7 C) (12/16 1246) Pulse Rate:  [73-87] 73 (12/16 1246) Resp:  [17-21] 21 (12/16 1246) BP: (152-160)/(56-57) 160/57 (12/16 1246) SpO2:  [98 %-100 %] 98 % (12/16 1253) Weight:  [94.7 kg] 94.7 kg (12/16 1042)  General:  Alert fatigued appearing 79 year old female in no acute distress. Head:  Normocephalic and atraumatic. Eyes:  No scleral icterus. Conjunctiva pink. Ears:  Normal auditory acuity. Nose:  No deformity, discharge or lesions. Mouth:  Dentition intact.  Upper partial plate in use.  No ulcers or lesions.  Neck:  Supple. No lymphadenopathy or thyromegaly.  Lungs: Sounds clear throughout. Heart: Regular rate and rhythm.  No murmur. Abdomen: Obese abdomen, soft, nondistended.  Nontender.  Questionable small amount of ascites. Rectal: Inflamed anal hemorrhoids, a Hemoclip was loose and found 1cm from the anal verge. A small amount of bright red blood on exam glove without melena. No stool.  Musculoskeletal:  Symmetrical without  gross deformities.  Pulses:  Normal pulses noted. Extremities:  1+ pitting edema Neurologic:  Alert and  oriented x4. No focal deficits.  Skin:  Intact without significant lesions or rashes. Psych:  Alert and cooperative. Normal mood and affect.  Intake/Output from previous day: No intake/output data recorded. Intake/Output this shift: No intake/output data recorded.  Lab Results: Recent Labs    06/30/20 1150  WBC 5.9  HGB 6.4*  HCT 21.3*  PLT 140*   BMET No results for input(s): NA, K, CL, CO2, GLUCOSE, BUN, CREATININE, CALCIUM in the last 72 hours. LFT No results for input(s): PROT, ALBUMIN, AST, ALT, ALKPHOS, BILITOT, BILIDIR, IBILI in the last 72 hours. PT/INR Recent Labs    06/30/20 1301  LABPROT 22.9*  INR 2.1*   Hepatitis Panel No results for input(s): HEPBSAG, HCVAB, HEPAIGM, HEPBIGM in the last 72 hours.    Studies/Results: No results found.  IMPRESSION/PLAN:  26.  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  Admission Hg 6.4.  1 unit of PRBCs currently transfusing ( 2 units ordered).  Previously admitted to the hospital 12/1 with anemia and GI heme + stool. 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  The colonoscopy identified patent ileocolonic anastomosis with intact staple line and visible sutures, Six  2 to 6 mm polyps were removed from the rectum, rectosigmoid, sigmoid, descending and transverse colon and two hemostatic clips were successfully placed.  Nonbleeding internal and external hemorrhoids were noted. Suspect recurrent UGI bleeding from gastric AVMs and lower GI bleeding from polypectomy site with hemoclip found loose on rectal exam. -Check H&H posttransfusion -Clear liquid diet -N.p.o. after midnight -EGD and colonoscopy tomorrow with Dr. Silverio Decamp. -Continue  pantoprazole 8 mg/hour infusion -Octreotide bolus followed by continuous infusion -Rocephin 1 g IV every 24 hours for SBP prophylaxis in the setting of GI bleeding -Defer Propanolol and diuretic recommendations to Dr. Silverio Decamp -CBC, CMP in a.m.  2.  History of cecal colon cancer stage IIIc status post resection July 2016 followed by adjunctive chemotherapy.  3. RLE DVT 05/10/2020 on Xarelto.  Last dose of Xarelto was taken at 6 PM on 12/14. -Hold Xarelto  4. A 2.8 x 2.5cm complex hyperdense right liver lesion per CTAP 04/29/2020, concerning for [place]. -Consider abdominal MRI during this admission versus as an outpatient  5.  DM II  Further recommendations per Dr. Karma Lew Dorathy Daft  06/30/2020, 1:36 PM    Attending physician's note   I have taken a history, examined the patient and reviewed the chart. I agree with the Advanced Practitioner's note, impression and recommendations.  79 year old very pleasant female with multiple comorbidities, diabetes, hypertension,  [place] cirrhosis, adenocarcinoma of cecum s/p right hemicolectomy, DVT on chronic anticoagulation with Xarelto presented with melena and hematochezia  Acute drop in hemoglobin to 6.4  She underwent EGD and colonoscopy June 17, 2020 with RFA of gastric antrum for GAVE.  Small esophageal varices she had anastomotic visible suture/staple removed during colonoscopy along with polypectomies  Likely etiology of GI bleed could be site of RFA in gastric antrum, ileocecal anastomosis with disruption of staple line or prior polypectomy sites  We will plan to proceed with repeat EGD and colonoscopy to evaluate and endoscopically intervene if needed  Clear liquids, bowel prep Hold Xarelto  The risks and benefits as well as alternatives of endoscopic procedure(s) have been discussed and reviewed. All questions answered. The patient agrees to proceed.    The patient was provided an opportunity to ask questions and  all were answered. The patient agreed with the plan and demonstrated an understanding of the instructions.  Damaris Hippo , MD [phone]

## 2020-06-30 NOTE — ED Triage Notes (Addendum)
Patient arrived via wheelchair from the [facility] for 6 month follow up  C/O shob, weakness, and rectal bleeding.   Hmg-6.4 today    Upper Endo and colonoscopy on Dec 3rd.  Esophageal varacies and cirrhosis  On Xarelto for recent DVT (RLE)   Hx: colon Cancer stage 3 in 2016  Last treatment 2017   Patient lives with Husband   Pt A/O per cancer center  Bp-152/56 100% RA

## 2020-06-30 NOTE — ED Notes (Signed)
Per Dr Gearldine Shown office, possible bleed-Hgb low

## 2020-06-30 NOTE — H&P (Signed)
History and Physical        Hospital Admission Note Date: 06/30/2020  Patient name: Cheryl Jordan record number: [id number] Date of birth: [birth year] Age: 79 y.o. Gender: female  PCP: Brunetta Jeans, PA-C  Patient coming from: home Lives with: husband At baseline, ambulates: cane  Chief Complaint    Chief Complaint  Patient presents with  . Abnormal Lab  . Weakness      HPI:   This is a 79 year old female with a past medical history of colon cancer, RLE DVT on Xarelto, hypothyroid cirrhosis, esophageal varices and portal hypertensive gastropathy with multiple recent hospitalizations for GI bleeding and was recently discharged on 12/4 for the same who presented to the ED today for recurrent rectal bleeding starting yesterday.  Patient was seen by her oncologist, Dr. Benay Spice, today for a 4-month follow-up who ordered an FOBT and CBC in response to these symptoms and she was found to have an Hb 6.4, Hb 9.6 on 12/9.  FOBT was positive.  Currently states that she has had some lightheadedness when standing but has not had any falls or loss of consciousness.  Reported bright red blood per rectum yesterday and this morning.  Last took her Xarelto last night.  Complains of some intermittent epigastric pain   ED Course: Afebrile, hemodynamically stable, on room air. Notable Labs: CMP pending, WBC 5.9, Hb 6.4, MCV 105, platelets 140, INR 2.1.  GI was consulted by the ED.  Patient received Protonix bolus and infusion and 1 units PRBCs ordered.    Vitals:   06/30/20 1437 06/30/20 1500  BP: (!) 133/58 (!) 145/54  Pulse: 78 79  Resp: 19 (!) 21  Temp: 98 F (36.7 C)   SpO2: 97% 98%     Review of Systems:  Review of Systems  All other systems reviewed and are negative.   Medical/Social/Family History   Past Medical History: Past Medical History:  Diagnosis Date  .  Allergy   . Anemia    hx of  . Anxiety   . Arthritis   . Colon cancer ([place]) dx'd 11/2014  . Diabetes mellitus without complication ([place])    TYPE II  . Fatty liver   . Gallbladder disease   . GERD (gastroesophageal reflux disease)   . History of gout   . Hypertension   . Hypothyroidism   . Kidney disorder    spot on left kidney  . Osteoporosis   . PONV (postoperative nausea and vomiting)     Past Surgical History:  Procedure Laterality Date  . BIOPSY  06/17/2020   Procedure: BIOPSY;  Surgeon: Rush Landmark Telford Nab., MD;  Location: [facility];  Service: Gastroenterology;;  . CHOLECYSTECTOMY    . COLONOSCOPY WITH PROPOFOL N/A 06/17/2020   Procedure: COLONOSCOPY WITH PROPOFOL;  Surgeon: Rush Landmark Telford Nab., MD;  Location: [facility];  Service: Gastroenterology;  Laterality: N/A;  . ESOPHAGOGASTRODUODENOSCOPY (EGD) WITH PROPOFOL N/A 06/17/2020   Procedure: ESOPHAGOGASTRODUODENOSCOPY (EGD) WITH PROPOFOL;  Surgeon: Rush Landmark Telford Nab., MD;  Location: [facility];  Service: Gastroenterology;  Laterality: N/A;  . FOREIGN BODY REMOVAL  06/17/2020   Procedure: FOREIGN BODY REMOVAL;  Surgeon: Rush Landmark Telford Nab., MD;  Location: [facility];  Service: Gastroenterology;;  .  GI RADIOFREQUENCY ABLATION  06/17/2020   Procedure: GI RADIOFREQUENCY ABLATION;  Surgeon: Rush Landmark Telford Nab., MD;  Location: [facility];  Service: Gastroenterology;;  . HEMOSTASIS CLIP PLACEMENT  06/17/2020   Procedure: HEMOSTASIS CLIP PLACEMENT;  Surgeon: Irving Copas., MD;  Location: [facility];  Service: Gastroenterology;;  . IR RADIOLOGIST EVAL & MGMT  01/24/2017  . KNEE CARTILAGE SURGERY Bilateral   . NEPHRECTOMY Left 02/10/2015   Procedure: OPEN RETROPERINTONEAL EXPLORATION LEFT RENAL  CYST DECORTICATION X 5;  Surgeon: Cleon Gustin, MD;  Location: [facility];  Service: Urology;  Laterality: Left;  . PARTIAL COLECTOMY N/A 02/10/2015   Procedure: OPEN RIGHT  COLECTOMY ;  Surgeon: [name], MD;  Location: [facility];  Service: General;  Laterality: N/A;  . POLYPECTOMY  06/17/2020   Procedure: POLYPECTOMY;  Surgeon: Irving Copas., MD;  Location: [facility];  Service: Gastroenterology;;  . TUBAL LIGATION      Medications: Prior to Admission medications   Medication Sig Start Date End Date Taking? Authorizing Provider  acetaminophen (TYLENOL) 500 MG tablet Take 500 mg by mouth every 6 (six) hours as needed for moderate pain.   Yes [provider]  amLODipine (NORVASC) 2.5 MG tablet Take 1 tablet (2.5 mg total) by mouth daily. 12/04/19  Yes Brunetta Jeans, PA-C  ferrous sulfate 325 (65 FE) MG EC tablet Take 1 tablet (325 mg total) by mouth 2 (two) times daily with a meal. Patient taking differently: Take 325 mg by mouth daily. 02/02/20  Yes Brunetta Jeans, PA-C  furosemide (LASIX) 40 MG tablet Take 1 tablet (40 mg total) by mouth daily. 05/25/20  Yes [name], MD  glipiZIDE (GLUCOTROL XL) 5 MG 24 hr tablet TAKE (1) TABLET DAILY WITH BREAKFAST. Patient taking differently: Take 5 mg by mouth daily. 12/03/19  Yes Brunetta Jeans, PA-C  hydrocortisone (ANUSOL-HC) 25 MG suppository Place 1 suppository (25 mg total) rectally at bedtime for 7 days, THEN 1 suppository (25 mg total) every other day for 14 days. Patient taking differently: Place 1 suppository (25 mg total) every other day for 14 days. 06/18/20 07/09/20 Yes Kyle, Tyrone A, DO  hydrocortisone cream 1 % Apply topically 2 (two) times daily. Patient taking differently: Apply 1 application topically 2 (two) times daily. 06/18/20  Yes Kyle, Tyrone A, DO  levothyroxine (SYNTHROID) 88 MCG tablet Take 1 tablet (88 mcg total) by mouth daily. 12/04/19  Yes Brunetta Jeans, PA-C  LORazepam (ATIVAN) 0.5 MG tablet Take 1 tablet (0.5 mg total) by mouth 2 (two) times daily as needed for anxiety. 06/06/20  Yes Brunetta Jeans, PA-C  omeprazole (PRILOSEC) 20 MG capsule Take 1 capsule (20 mg total) by mouth 2  (two) times daily. 06/18/20 07/18/20 Yes Kyle, Tyrone A, DO  propranolol (INDERAL) 40 MG tablet Take 1 tablet (40 mg total) by mouth 2 (two) times daily. 06/23/20  Yes Brunetta Jeans, PA-C  rivaroxaban (XARELTO) 20 MG TABS tablet Take 1 tablet (20 mg total) by mouth daily with supper. Resume 06/21/20 Patient taking differently: Take 20 mg by mouth daily with supper. 06/18/20  Yes Kyle, Tyrone A, DO  ACCU-CHEK AVIVA PLUS test strip  10/20/18   [provider]  Blood Glucose Monitoring Suppl (ACCU-CHEK AVIVA) device Use as instructed 07/03/19 07/02/20  Brunetta Jeans, PA-C  loperamide (IMODIUM) 2 MG capsule Take 2 capsules by mouth once. Then take additional 2mg  capsule as needed for continued diarrhea. No more than 16 mg in 24 hours.  Patient not taking: No sig reported 04/27/20   Brunetta Jeans, PA-C  ondansetron (ZOFRAN) 8 MG tablet Take 1 tablet (8 mg total) by mouth every 8 (eight) hours as needed for nausea or vomiting. Patient not taking: No sig reported 06/06/20   Brunetta Jeans, PA-C  polycarbophil (FIBERCON) 625 MG tablet Take 1 tablet (625 mg total) by mouth daily. Patient not taking: Reported on 06/30/2020 06/19/20 07/19/20  Cherylann Ratel A, DO  spironolactone (ALDACTONE) 50 MG tablet Take 1 tablet (50 mg total) by mouth daily. 05/25/20   [name], MD  sucralfate (CARAFATE) 1 g tablet Take 1 tablet (1 g total) by mouth 2 (two) times daily. 06/18/20 07/18/20  Cherylann Ratel A, DO  telmisartan (MICARDIS) 40 MG tablet Take 0.5 tablets (20 mg total) by mouth in the morning and at bedtime. 06/18/20   Cherylann Ratel A, DO    Allergies:   Allergies  Allergen Reactions  . Aspirin Other (See Comments)    Runny nose   . Codeine Nausea And Vomiting    Social History:  reports that she has never smoked. She has never used smokeless tobacco. She reports that she does not drink alcohol and does not use drugs.  Family History: Family History  Problem Relation Age of Onset  . Diabetes  Mother   . Hypertension Other   . COPD Father   . Hypertension Sister   . GER disease Sister   . Heart attack Brother   . COPD Brother   . Lung cancer Brother   . COPD Brother   . Hypertension Brother      Objective   Physical Exam: Blood pressure (!) 145/54, pulse 79, temperature 98 F (36.7 C), temperature source Oral, resp. rate (!) 21, SpO2 98 %.  Physical Exam Vitals and nursing note reviewed.  Constitutional:      Appearance: Normal appearance.  HENT:     Head: Normocephalic and atraumatic.  Eyes:     Conjunctiva/sclera: Conjunctivae normal.  Cardiovascular:     Rate and Rhythm: Normal rate and regular rhythm.  Pulmonary:     Effort: Pulmonary effort is normal.     Breath sounds: Normal breath sounds.  Abdominal:     General: Abdomen is flat.     Palpations: Abdomen is soft.     Tenderness: There is no abdominal tenderness.  Musculoskeletal:        General: No swelling or tenderness.  Skin:    Coloration: Skin is pale. Skin is not jaundiced.  Neurological:     Mental Status: She is alert. Mental status is at baseline.  Psychiatric:        Mood and Affect: Mood normal.        Behavior: Behavior normal.     LABS on Admission: I have personally reviewed all the labs and imaging below    Basic Metabolic Panel: Recent Labs  Lab 06/30/20 1301  NA 140  K 4.3  CL 109  CO2 23  GLUCOSE 116*  BUN 49*  CREATININE 1.97*  CALCIUM 8.8*   Liver Function Tests: Recent Labs  Lab 06/30/20 1301  AST 27  ALT 17  ALKPHOS 69  BILITOT 1.0  PROT 5.7*  ALBUMIN 2.9*   No results for input(s): LIPASE, AMYLASE in the last 168 hours. No results for input(s): AMMONIA in the last 168 hours. CBC: Recent Labs  Lab 06/30/20 1150  WBC 5.9  NEUTROABS 3.2  HGB 6.4*  HCT 21.3*  MCV 104.9*  PLT 140*   Cardiac Enzymes: No results for input(s): CKTOTAL, CKMB, CKMBINDEX, TROPONINI in the last 168 hours. BNP: Invalid input(s): POCBNP CBG: No results for input(s):  GLUCAP in the last 168 hours.  Radiological Exams on Admission:  No results found.    EKG:  Not Done   A & P   Principal Problem:   GI bleed Active Problems:   Hypothyroidism   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[place])   Hypertension associated with diabetes ([place])   Anemia   1. Symptomatic anemia secondary to recurrent GI bleed a. Hemodynamically stable b. Hb 6.4 c. Check iron studies d. Continue PRBC transfusion and trend H&H e. On Protonix drip f. Hold Xarelto g. Per GI: Clear liquid diet, n.p.o. after midnight for EGD and colonoscopy tomorrow, continue Protonix drip, octreotide bolus followed by infusion, Rocephin for SBP prophylaxis  2. 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 As above  3. RLE DVT  a. Diagnosed 05/10/2020 and on Xarelto b. Hold Xarelto  4. 2.8 x 2.5 cm complex hyperdense abnormality in right hepatic lobe concerning for [place] a. Per GI  5. Diabetes a. Sliding scale  6. Hypertension a. Continue amlodipine for now but be mindful of Bps  7. Hypothyroid a. Continue home med    DVT prophylaxis: none   Code Status: Partial Code  Diet: CLD, Npo after midnight Family Communication: Admission, patients condition and plan of care including tests being ordered have been discussed with the patient who indicates understanding and agrees with the plan and Code Status.  Disposition Plan: The appropriate patient status for this patient is INPATIENT. Inpatient status is judged to be reasonable and necessary in order to provide the required intensity of service to ensure the patient's safety. The patient's presenting symptoms, physical exam findings, and initial radiographic and laboratory data in the context of their chronic comorbidities is felt to place them at high risk for further clinical deterioration. Furthermore, it is not anticipated that the patient will be medically stable for discharge from the hospital  within 2 midnights of admission. The following factors support the patient status of inpatient.   " The patient's presenting symptoms include GI bleed. " The worrisome physical exam findings include palor. " The initial radiographic and laboratory data are worrisome because of positive FOBT, anemia. " The chronic co-morbidities include recurrent GI bleed, cirrhosis with varices.   * I certify that at the point of admission it is my clinical judgment that the patient will require inpatient hospital care spanning beyond 2 midnights from the point of admission due to high intensity of service, high risk for further deterioration and high frequency of surveillance required.*     Consultants  . GI  Procedures  . 1 u PRBC transfusion  Time Spent on Admission: 64 minutes    [name], DO [facility]  06/30/2020, 4:39 PM

## 2020-07-01 ENCOUNTER — Inpatient Hospital Stay (HOSPITAL_COMMUNITY): Payer: Medicare Other | Admitting: Anesthesiology

## 2020-07-01 ENCOUNTER — Encounter (HOSPITAL_COMMUNITY)
Admission: EM | Disposition: A | Discharge status: Home / Self Care | Payer: Self-pay | Source: Home / Self Care | Attending: Internal Medicine

## 2020-07-01 ENCOUNTER — Encounter (HOSPITAL_COMMUNITY): Payer: Self-pay | Admitting: Internal Medicine

## 2020-07-01 DIAGNOSIS — K6389 Other specified diseases of intestine: Secondary | ICD-10-CM

## 2020-07-01 DIAGNOSIS — K633 Ulcer of intestine: Secondary | ICD-10-CM

## 2020-07-01 DIAGNOSIS — D62 Acute posthemorrhagic anemia: Secondary | ICD-10-CM

## 2020-07-01 DIAGNOSIS — K31811 Angiodysplasia of stomach and duodenum with bleeding: Principal | ICD-10-CM

## 2020-07-01 HISTORY — PX: COLONOSCOPY WITH PROPOFOL: SHX5780

## 2020-07-01 HISTORY — PX: HEMOSTASIS CLIP PLACEMENT: SHX6857

## 2020-07-01 HISTORY — PX: ESOPHAGOGASTRODUODENOSCOPY (EGD) WITH PROPOFOL: SHX5813

## 2020-07-01 HISTORY — PX: HOT HEMOSTASIS: SHX5433

## 2020-07-01 LAB — TYPE AND SCREEN
ABO/RH(D): A POS
Antibody Screen: NEGATIVE
Unit division: 0
Unit division: 0

## 2020-07-01 LAB — CBC
HCT: 27.7 % — ABNORMAL LOW (ref 36.0–46.0)
Hemoglobin: 8.5 g/dL — ABNORMAL LOW (ref 12.0–15.0)
MCH: 30.7 pg (ref 26.0–34.0)
MCHC: 30.7 g/dL (ref 30.0–36.0)
MCV: 100 fL (ref 80.0–100.0)
Platelets: 115 10*3/uL — ABNORMAL LOW (ref 150–400)
RBC: 2.77 MIL/uL — ABNORMAL LOW (ref 3.87–5.11)
RDW: 21.7 % — ABNORMAL HIGH (ref 11.5–15.5)
WBC: 4.8 10*3/uL (ref 4.0–10.5)
nRBC: 0 % (ref 0.0–0.2)

## 2020-07-01 LAB — BPAM RBC
Blood Product Expiration Date: 202201102359
Blood Product Expiration Date: 202201102359
ISSUE DATE / TIME: 202112161415
ISSUE DATE / TIME: 202112161942
Unit Type and Rh: 6200
Unit Type and Rh: 6200

## 2020-07-01 LAB — IRON AND TIBC
Iron: 127 ug/dL (ref 28–170)
Saturation Ratios: 35 % — ABNORMAL HIGH (ref 10.4–31.8)
TIBC: 364 ug/dL (ref 250–450)
UIBC: 237 ug/dL

## 2020-07-01 LAB — BASIC METABOLIC PANEL
Anion gap: 11 (ref 5–15)
BUN: 44 mg/dL — ABNORMAL HIGH (ref 8–23)
CO2: 20 mmol/L — ABNORMAL LOW (ref 22–32)
Calcium: 8.6 mg/dL — ABNORMAL LOW (ref 8.9–10.3)
Chloride: 111 mmol/L (ref 98–111)
Creatinine, Ser: 1.93 mg/dL — ABNORMAL HIGH (ref 0.44–1.00)
GFR, Estimated: 26 mL/min — ABNORMAL LOW (ref >60)
Glucose, Bld: 181 mg/dL — ABNORMAL HIGH (ref 70–99)
Potassium: 5 mmol/L (ref 3.5–5.1)
Sodium: 142 mmol/L (ref 135–145)

## 2020-07-01 LAB — GLUCOSE, CAPILLARY
Glucose-Capillary: 141 mg/dL — ABNORMAL HIGH (ref 70–99)
Glucose-Capillary: 130 mg/dL — ABNORMAL HIGH (ref 70–99)
Glucose-Capillary: 106 mg/dL — ABNORMAL HIGH (ref 70–99)
Glucose-Capillary: 132 mg/dL — ABNORMAL HIGH (ref 70–99)

## 2020-07-01 LAB — HEMOGLOBIN AND HEMATOCRIT, BLOOD
HCT: 27 % — ABNORMAL LOW (ref 36.0–46.0)
Hemoglobin: 8.6 g/dL — ABNORMAL LOW (ref 12.0–15.0)

## 2020-07-01 LAB — PROTIME-INR
INR: 1.6 — ABNORMAL HIGH (ref 0.8–1.2)
Prothrombin Time: 18.1 seconds — ABNORMAL HIGH (ref 11.4–15.2)

## 2020-07-01 LAB — FERRITIN: Ferritin: 85 ng/mL (ref 11–307)

## 2020-07-01 SURGERY — ESOPHAGOGASTRODUODENOSCOPY (EGD) WITH PROPOFOL
Anesthesia: Monitor Anesthesia Care

## 2020-07-01 MED ORDER — SODIUM CHLORIDE 0.9 % IV SOLN: INTRAVENOUS | Status: DC | PRN

## 2020-07-01 MED ORDER — LIDOCAINE 2% (20 MG/ML) 5 ML SYRINGE
INTRAMUSCULAR | Status: DC | PRN
  Administered 2020-07-01: 40 mg via INTRAVENOUS

## 2020-07-01 MED ORDER — PROPOFOL 10 MG/ML IV BOLUS
INTRAVENOUS | Status: DC | PRN
  Administered 2020-07-01 (×19): 20 mg via INTRAVENOUS

## 2020-07-01 SURGICAL SUPPLY — 25 items

## 2020-07-01 NOTE — Anesthesia Preprocedure Evaluation (Signed)
Anesthesia Evaluation  Patient identified by MRN, date of birth, ID band Patient awake    Reviewed: Patient's Chart, lab work & pertinent test results, reviewed documented beta blocker date and time   History of Anesthesia Complications (+) PONV  Airway Mallampati: II  TM Distance: >3 FB Neck ROM: Full    Dental  (+) Partial Lower, Partial Upper   Pulmonary neg pulmonary ROS,    Pulmonary exam normal        Cardiovascular hypertension, Pt. on medications and Pt. on home beta blockers  Rhythm:Regular Rate:Normal     Neuro/Psych Anxiety negative neurological ROS     GI/Hepatic Neg liver ROS, GERD  Medicated,Rectal bleeding   Endo/Other  diabetes, Well Controlled, Type 2, Insulin DependentHypothyroidism   Renal/GU   negative genitourinary   Musculoskeletal  (+) Arthritis , Osteoarthritis,    Abdominal (+)  Abdomen: soft. Bowel sounds: normal.  Peds  Hematology  (+) anemia ,   Anesthesia Other Findings   Reproductive/Obstetrics                             Anesthesia Physical  Anesthesia Plan  ASA: II  Anesthesia Plan: MAC   Post-op Pain Management:    Induction: Intravenous  PONV Risk Score and Plan: 3  Airway Management Planned: Simple Face Mask, Natural Airway and Nasal Cannula  Additional Equipment: None  Intra-op Plan:   Post-operative Plan:   Informed Consent: I have reviewed the patients History and Physical, chart, labs and discussed the procedure including the risks, benefits and alternatives for the proposed anesthesia with the patient or authorized representative who has indicated his/her understanding and acceptance.   Patient has DNR.  Discussed DNR with patient.   Dental advisory given  Plan Discussed with: CRNA and Anesthesiologist  Anesthesia Plan Comments: (Lab Results      Component                Value               Date                      WBC                       4.0                 06/17/2020                HGB                      9.0 (L)             06/17/2020                HCT                      29.9 (L)            06/17/2020                MCV                      98.0                06/17/2020                PLT  106 (L)             06/17/2020          )        Anesthesia Quick Evaluation

## 2020-07-01 NOTE — Progress Notes (Signed)
Endoscopy RN called report to patient's floor RN, Manuela Schwartz.  Patient stable and ready to return to inpatient room.

## 2020-07-01 NOTE — Anesthesia Postprocedure Evaluation (Signed)
Anesthesia Post Note  Patient: Rainey J Morreale  Procedure(s) Performed: ESOPHAGOGASTRODUODENOSCOPY (EGD) WITH PROPOFOL (N/A ) COLONOSCOPY WITH PROPOFOL (N/A ) HOT HEMOSTASIS (ARGON PLASMA COAGULATION/BICAP) (N/A ) HEMOSTASIS CLIP PLACEMENT     Patient location during evaluation: PACU Anesthesia Type: MAC Level of consciousness: awake and alert Pain management: pain level controlled Vital Signs Assessment: post-procedure vital signs reviewed and stable Respiratory status: spontaneous breathing and respiratory function stable Cardiovascular status: stable Postop Assessment: no apparent nausea or vomiting Anesthetic complications: no   No complications documented.  Last Vitals:  Vitals:   07/01/20 1550 07/01/20 1600  BP: (!) 145/55 (!) 158/57  Pulse: (!) 58 (!) 59  Resp: 16 17  Temp:    SpO2: 92% 95%    Last Pain:  Vitals:   07/01/20 1600  TempSrc:   PainSc: 0-No pain                 Dashton Czerwinski DANIEL

## 2020-07-01 NOTE — Interval H&P Note (Signed)
History and Physical Interval Note:  07/01/2020 1:51 PM  Cheryl Jordan  has presented today for surgery, with the diagnosis of Anemia, GI bleeding.  The various methods of treatment have been discussed with the patient and family. After consideration of risks, benefits and other options for treatment, the patient has consented to  Procedure(s): ESOPHAGOGASTRODUODENOSCOPY (EGD) WITH PROPOFOL (N/A) COLONOSCOPY WITH PROPOFOL (N/A) as a surgical intervention.  The patient's history has been reviewed, patient examined, no change in status, stable for surgery.  I have reviewed the patient's chart and labs.  Questions were answered to the patient's satisfaction.     Tamela Elsayed

## 2020-07-01 NOTE — Op Note (Addendum)
[facility] Patient Name: Cheryl Jordan Procedure Date: 07/01/2020 MRN: [id number] Attending MD: Mauri Pole , MD Date of Birth: 1940/12/08 CSN: [id number] Age: 79 Admit Type: Inpatient Procedure:                Upper GI endoscopy Indications:              Active gastrointestinal bleeding Providers:                Mauri Pole, MD, Cleda Daub, RN, Elspeth Cho Tech., Technician, Glenis Smoker, CRNA Referring MD:              Medicines:                Monitored Anesthesia Care Complications:            No immediate complications. Estimated Blood Loss:     Estimated blood loss was minimal. Procedure:                Pre-Anesthesia Assessment:                           - Prior to the procedure, a History and Physical                            was performed, and patient medications and                            allergies were reviewed. The patient's tolerance of                            previous anesthesia was also reviewed. The risks                            and benefits of the procedure and the sedation                            options and risks were discussed with the patient.                            All questions were answered, and informed consent                            was obtained. Prior Anticoagulants: The patient                            last took Xarelto (rivaroxaban) 2 days prior to the                            procedure. ASA Grade Assessment: III - A patient                            with severe systemic disease. After reviewing the  risks and benefits, the patient was deemed in                            satisfactory condition to undergo the procedure.                           After obtaining informed consent, the endoscope was                            passed under direct vision. Throughout the                            procedure, the patient's blood pressure, pulse,  and                            oxygen saturations were monitored continuously. The                            [id number]-[id number] ([id number]) was introduced through the                            mouth, and advanced to the second part of duodenum.                            The upper GI endoscopy was accomplished without                            difficulty. The patient tolerated the procedure                            well. Scope In: Scope Out: Findings:      The Z-line was regular and was found 35 cm from the incisors.      No gross lesions were noted in the entire esophagus.      Moderate portal hypertensive gastropathy was found in the gastric fundus       and in the gastric body.      Clotted blood was found in the gastric antrum and in the prepyloric       region of the stomach.      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       Coagulation for hemostasis using argon plasma at 0.5 liters/minute and       20 watts was successful. For hemostasis, two hemostatic clips were       successfully placed (MR conditional). There was no bleeding at the end       of the procedure.      The examined duodenum was normal. Impression:               - Z-line regular, 35 cm from the incisors.                           - No gross lesions in esophagus.                           -  Portal hypertensive gastropathy.                           - Clotted blood in the gastric antrum and in the                            prepyloric region of the stomach.                           - Gastric antral vascular ectasia with bleeding.                            Treated with argon plasma coagulation (APC). Clips                            (MR conditional) were placed.                           - Normal examined duodenum.                           - No specimens collected. Moderate Sedation:      Not Applicable - Patient had  care per Anesthesia. Recommendation:           - Patient has a contact number available for                            emergencies. The signs and symptoms of potential                            delayed complications were discussed with the                            patient. Return to normal activities tomorrow.                            Written discharge instructions were provided to the                            patient.                           - Soft diet for 3 days.                           - Continue present medications.                           - Resume Xarelto (rivaroxaban) at prior dose in 7                            days.                           - Use Protonix (pantoprazole) 40 mg IV twice daily.                           -  Use sucralfate tablets 1 gram PO QID for 3 months. Procedure Code(s):        --- Professional ---                           [phone], Esophagogastroduodenoscopy, flexible,                            transoral; with control of bleeding, any method Diagnosis Code(s):        --- Professional ---                           K76.6, Portal hypertension                           K31.89, Other diseases of stomach and duodenum                           K92.2, Gastrointestinal hemorrhage, unspecified                           K31.811, Angiodysplasia of stomach and duodenum                            with bleeding CPT copyright 2019 [employer]. All rights reserved. The codes documented in this report are preliminary and upon coder review may  be revised to meet current compliance requirements. Mauri Pole, MD 07/01/2020 3:56:18 PM This report has been signed electronically. Number of Addenda: 0

## 2020-07-01 NOTE — Anesthesia Procedure Notes (Signed)
Procedure Name: MAC Date/Time: 07/01/2020 2:32 PM Performed by: Cynda Familia, CRNA Pre-anesthesia Checklist: Patient identified, Emergency Drugs available, Suction available, Patient being monitored and Timeout performed Oxygen Delivery Method: Simple face mask Placement Confirmation: positive ETCO2 and breath sounds checked- equal and bilateral Dental Injury: Teeth and Oropharynx as per pre-operative assessment

## 2020-07-01 NOTE — Op Note (Addendum)
[facility] Patient Name: Cheryl Jordan Procedure Date: 07/01/2020 MRN: [id number] Attending MD: Mauri Pole , MD Date of Birth: August 24, 1940 CSN: [id number] Age: 79 Admit Type: Inpatient Procedure:                Colonoscopy Indications:              Evaluation of unexplained GI bleeding presenting                            with Hematochezia Providers:                Mauri Pole, MD, Cleda Daub, RN, Elspeth Cho Tech., Technician, Glenis Smoker, CRNA Referring MD:              Medicines:                Monitored Anesthesia Care Complications:            No immediate complications. Estimated Blood Loss:     Estimated blood loss was minimal. Procedure:                Pre-Anesthesia Assessment:                           - Prior to the procedure, a History and Physical                            was performed, and patient medications and                            allergies were reviewed. The patient's tolerance of                            previous anesthesia was also reviewed. The risks                            and benefits of the procedure and the sedation                            options and risks were discussed with the patient.                            All questions were answered, and informed consent                            was obtained. Prior Anticoagulants: The patient                            last took Xarelto (rivaroxaban) 2 days prior to the                            procedure. ASA Grade Assessment: III - A patient  with severe systemic disease. After reviewing the                            risks and benefits, the patient was deemed in                            satisfactory condition to undergo the procedure.                           After obtaining informed consent, the colonoscope                            was passed under direct vision. Throughout the                             procedure, the patient's blood pressure, pulse, and                            oxygen saturations were monitored continuously. The                            [id number]-[id number] ([id number] Olympus pediatric colonscope                            was introduced through the anus and advanced to the                            the ileocolonic anastomosis. The colonoscopy was                            performed without difficulty. The patient tolerated                            the procedure well. The quality of the bowel                            preparation was adequate. The ileocecal valve,                            appendiceal orifice, and rectum were photographed. Scope In: 3:04:51 PM Scope Out: 3:19:26 PM Scope Withdrawal Time: 0 hours 11 minutes 36 seconds  Total Procedure Duration: 0 hours 14 minutes 35 seconds  Findings:      The perianal and digital rectal examinations were normal.      A less than 5 mm scar was found at the anastomosis. Adjacent mucosal       findings include friability and hemorrhagic appearance. For hemostasis,       one hemostatic clip was successfully placed (MR conditional). There was       no bleeding at the end of the procedure.      A 8 mm post polypectomy scar was found in the rectum. Adjacent mucosal       findings include friability, hemorrhagic appearance, nodularity and       ulcerations. For hemostasis, two hemostatic clips were successfully       placed (  MR conditional). There was no bleeding at the end of the       procedure.      The exam was otherwise without abnormality. Impression:               - Scar at the colonic anastomosis. Clip (MR                            conditional) was placed.                           - Post-polypectomy scar in the rectum. Clips (MR                            conditional) were placed.                           - The examination was otherwise normal.                           - No specimens collected. Moderate  Sedation:      Not Applicable - Patient had care per Anesthesia. Recommendation:           - Patient has a contact number available for                            emergencies. The signs and symptoms of potential                            delayed complications were discussed with the                            patient. Return to normal activities tomorrow.                            Written discharge instructions were provided to the                            patient.                           - See the other procedure note for documentation of                            additional recommendations. Procedure Code(s):        --- Professional ---                           [id number], Colonoscopy, flexible; with control of                            bleeding, any method Diagnosis Code(s):        --- Professional ---                           K63.89, Other specified diseases of intestine  Z98.890, Other specified postprocedural states                           K92.1, Melena (includes Hematochezia) CPT copyright 2019 [employer]. All rights reserved. The codes documented in this report are preliminary and upon coder review may  be revised to meet current compliance requirements. Mauri Pole, MD 07/01/2020 4:00:14 PM This report has been signed electronically. Number of Addenda: 0

## 2020-07-01 NOTE — Progress Notes (Signed)
Patient ID: Cheryl Jordan, female   DOB: 1941-03-31, 79 y.o.   MRN: [id number]  PROGRESS NOTE    Cheryl Jordan  TML:[id number] DOB: 09-17-1940 DOA: 06/30/2020 PCP: Brunetta Jeans, PA-C   Brief Narrative:  79 year old female with a past medical history of colon cancer, RLE DVT on Xarelto, hypothyroidism, cirrhosis, esophageal varices and portal hypertensive gastropathy with multiple recent hospitalizations for GI bleeding and was recently discharged on 06/18/20 for the same who presented to the ED on 06/30/2020 for recurrent rectal bleeding starting a day prior to presentation. Patient was seen by her oncologist, Dr. Benay Spice, on 06/30/2020 for a 30-month follow-up who ordered an FOBT and CBC in response to these symptoms and she was found to have an Hb 6.4, Hb 9.6 on 12/9.  FOBT was positive.   She had complained of some lightheadedness as well.  She was started on IV Protonix and octreotide.  GI was consulted.  Assessment & Plan:   Recurrent GI bleeding Acute on chronic blood loss anemia/symptomatic anemia -Presented with rectal bleeding and hemoglobin of 6.4.  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  Hemoglobin 8.5 this morning -GI following and planning for EGD and colonoscopy today although patient was unable to finish her colon prep  Right lower extremity DVT -Diagnosed on 05/10/2020 and currently on Xarelto.  Xarelto on hold.  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 -Continue propranolol  Acute kidney injury on chronic kidney disease stage IIIb -Probably from  GI bleeding.  Creatinine on 06/23/2020 was 1.25.  Presented with creatinine of 1.97.  Creatinine of 1.93 today.  Monitor.  Chronic thrombocytopenia -From cirrhosis of liver.  Monitor.  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 Sherrill/oncology  2.8 x 2.5 cm complex hypodense abnormality in the right hepatic lobe concerning for [facility] -Follow further GI recommendations..  Might need MRI of the abdomen at some point, possibly as an outpatient  Diabetes mellitus type 2 -Continue CBGs with SSI.  Hypothyroidism -Continue levothyroxine  Hypertension -blood pressure stable.  Continue amlodipine  Generalized conditioning -Overall prognosis is guarded to poor.  Will request palliative care evaluation   DVT prophylaxis: None  code Status: Partial Family Communication: None at bedside Disposition Plan: Status is: Inpatient  Remains inpatient appropriate because:Inpatient level of care appropriate due to severity of illness   Dispo: The patient is from: Home              Anticipated d/c is to: Home              Anticipated d/c date is: 1 day              Patient currently is not medically stable to d/c.  Consultants: GI.  Procedures: None  Antimicrobials: Rocephin from 06/30/2020 onwards   Subjective: Patient seen and examined at bedside.  Continues of some intermittent abdominal pain.  Patient could not complete her colonoscopy prep because of nausea but no vomiting.  No worsening shortness of breath or fever reported.  Still had some rectal bleeding overnight.  Objective: Vitals:   06/30/20 2007 06/30/20 2153  06/30/20 2205 07/01/20 0558  BP: (!) 135/50 (!) 157/61 (!) 147/62 (!) 112/48  Pulse: 73 79 75 64  Resp: 17 15 18 14   Temp: 97.8 F (36.6 C) 97.7 F (36.5 C) 97.8 F (36.6 C) 97.9 F (36.6 C)  TempSrc: Oral Oral Oral Oral  SpO2: 93%  93% 93%  Weight:      Height:         Intake/Output Summary (Last 24 hours) at 07/01/2020 1040 Last data filed at 07/01/2020 0603 Gross per 24 hour  Intake 2357.2 ml  Output --  Net 2357.2 ml   Filed Weights   06/30/20 2002  Weight: 94.7 kg    Examination:  General exam: Appears calm and comfortable.  Looks chronically ill.  Looks pale Respiratory system: Bilateral decreased breath sounds at bases Cardiovascular system: S1 & S2 heard, Rate controlled Gastrointestinal system: Abdomen is obese, nondistended, soft and mildly tender in the periumbilical region.  Normal bowel sounds heard. Extremities: No cyanosis, clubbing; mild lower extremity edema present Central nervous system: Alert and oriented. No focal neurological deficits. Moving extremities Skin: No rashes, lesions or ulcers Psychiatry: Flat affect.    Data Reviewed: I have personally reviewed following labs and imaging studies  CBC: Recent Labs  Lab 06/30/20 1150 07/01/20 0032  WBC 5.9 4.8  NEUTROABS 3.2  --   HGB 6.4* 8.5*  8.6*  HCT 21.3* 27.7*  27.0*  MCV 104.9* 100.0  PLT 140* 619*   Basic Metabolic Panel: Recent Labs  Lab 06/30/20 1301 07/01/20 0032  NA 140 142  K 4.3 5.0  CL 109 111  CO2 23 20*  GLUCOSE 116* 181*  BUN 49* 44*  CREATININE 1.97* 1.93*  CALCIUM 8.8* 8.6*   GFR: Estimated Creatinine Clearance: 28.4 mL/min (A) (by C-G formula based on SCr of 1.93 mg/dL (H)). Liver Function Tests: Recent Labs  Lab 06/30/20 1301  AST 27  ALT 17  ALKPHOS 69  BILITOT 1.0  PROT 5.7*  ALBUMIN 2.9*   No results for input(s): LIPASE, AMYLASE in the last 168 hours. No results for input(s): AMMONIA in the last 168 hours. Coagulation Profile: Recent Labs  Lab 06/30/20 1301 07/01/20 0032  INR 2.1* 1.6*   Cardiac Enzymes: No results for input(s): CKTOTAL, CKMB, CKMBINDEX, TROPONINI in the last 168 hours. BNP (last 3 results) Recent Labs    03/30/20 1507  PROBNP 250   HbA1C: No results for input(s): HGBA1C in the last  72 hours. CBG: Recent Labs  Lab 06/30/20 2252 07/01/20 0738  GLUCAP 154* 141*   Lipid Profile: No results for input(s): CHOL, HDL, LDLCALC, TRIG, CHOLHDL, LDLDIRECT in the last 72 hours. Thyroid Function Tests: No results for input(s): TSH, T4TOTAL, FREET4, T3FREE, THYROIDAB in the last 72 hours. Anemia Panel: Recent Labs    07/01/20 0032  FERRITIN 85  TIBC 364  IRON 127   Sepsis Labs: No results for input(s): PROCALCITON, LATICACIDVEN in the last 168 hours.  Recent Results (from the past 240 hour(s))  Resp Panel by RT-PCR (Flu A&B, Covid) Nasopharyngeal Swab     Status: None   Collection Time: 06/30/20  1:26 PM   Specimen: Nasopharyngeal Swab; Nasopharyngeal(NP) swabs in vial transport medium  Result Value Ref Range Status   SARS Coronavirus 2 by RT PCR NEGATIVE NEGATIVE Final    Comment: (NOTE) SARS-CoV-2 target nucleic acids are NOT DETECTED.  The SARS-CoV-2 RNA is generally detectable in upper respiratory specimens during the acute phase of infection. The lowest concentration of SARS-CoV-2 viral  copies this assay can detect is 138 copies/mL. A negative result does not preclude SARS-Cov-2 infection and should not be used as the sole basis for treatment or other patient management decisions. A negative result may occur with  improper specimen collection/handling, submission of specimen other than nasopharyngeal swab, presence of viral mutation(s) within the areas targeted by this assay, and inadequate number of viral copies(<138 copies/mL). A negative result must be combined with clinical observations, patient history, and epidemiological information. The expected result is Negative.  Fact Sheet for Patients:  EntrepreneurPulse.com.au  Fact Sheet for Healthcare Providers:  IncredibleEmployment.be  This test is no t yet approved or cleared by the [place] FDA and  has been authorized for detection and/or diagnosis of  SARS-CoV-2 by FDA under an Emergency Use Authorization (EUA). This EUA will remain  in effect (meaning this test can be used) for the duration of the COVID-19 declaration under Section 564(b)(1) of the Act, 21 U.S.C.section 360bbb-3(b)(1), unless the authorization is terminated  or revoked sooner.       Influenza A by PCR NEGATIVE NEGATIVE Final   Influenza B by PCR NEGATIVE NEGATIVE Final    Comment: (NOTE) The Xpert Xpress SARS-CoV-2/FLU/RSV plus assay is intended as an aid in the diagnosis of influenza from Nasopharyngeal swab specimens and should not be used as a sole basis for treatment. Nasal washings and aspirates are unacceptable for Xpert Xpress SARS-CoV-2/FLU/RSV testing.  Fact Sheet for Patients: EntrepreneurPulse.com.au  Fact Sheet for Healthcare Providers: IncredibleEmployment.be  This test is not yet approved or cleared by the [place] FDA and has been authorized for detection and/or diagnosis of SARS-CoV-2 by FDA under an Emergency Use Authorization (EUA). This EUA will remain in effect (meaning this test can be used) for the duration of the COVID-19 declaration under Section 564(b)(1) of the Act, 21 U.S.C. section 360bbb-3(b)(1), unless the authorization is terminated or revoked.  Performed at [facility], [place] [address] [place],  [address]          Radiology Studies: No results found.      Scheduled Meds: . amLODipine  2.5 mg Oral Daily  . insulin aspart  0-9 Units Subcutaneous TID WC  . levothyroxine  88 mcg Oral Daily  . propranolol  40 mg Oral BID   Continuous Infusions: . sodium chloride 20 mL/hr at 06/30/20 2209  . cefTRIAXone (ROCEPHIN)  IV Stopped (06/30/20 1601)  . octreotide  (SANDOSTATIN)    IV infusion 50 mcg/hr (07/01/20 0204)  . pantoprozole (PROTONIX) infusion 8 mg/hr (07/01/20 0818)          Aline August, MD [facility] 07/01/2020, 10:40 AM

## 2020-07-01 NOTE — Transfer of Care (Signed)
Immediate Anesthesia Transfer of Care Note  [name]  Procedure(s) Performed: ESOPHAGOGASTRODUODENOSCOPY (EGD) WITH PROPOFOL (N/A ) COLONOSCOPY WITH PROPOFOL (N/A ) HOT HEMOSTASIS (ARGON PLASMA COAGULATION/BICAP) (N/A ) HEMOSTASIS CLIP PLACEMENT  Patient Location: PACU and Endoscopy Unit  Anesthesia Type:MAC  Level of Consciousness: awake and alert   Airway & Oxygen Therapy: Patient Spontanous Breathing and Patient connected to face mask oxygen  Post-op Assessment: Report given to RN and Post -op Vital signs reviewed and stable  Post vital signs: Reviewed and stable  Last Vitals:  Vitals Value Taken Time  BP    Temp    Pulse 60 07/01/20 1530  Resp 16 07/01/20 1530  SpO2 96 % 07/01/20 1530  Vitals shown include unvalidated device data.  Last Pain:  Vitals:   07/01/20 1220  TempSrc: Oral  PainSc: 4          Complications: No complications documented.

## 2020-07-02 DIAGNOSIS — I82409 Acute embolism and thrombosis of unspecified deep veins of unspecified lower extremity: Secondary | ICD-10-CM

## 2020-07-02 DIAGNOSIS — I82491 Acute embolism and thrombosis of other specified deep vein of right lower extremity: Secondary | ICD-10-CM

## 2020-07-02 LAB — CBC
HCT: 25.3 % — ABNORMAL LOW (ref 36.0–46.0)
Hemoglobin: 7.6 g/dL — ABNORMAL LOW (ref 12.0–15.0)
MCH: 30.6 pg (ref 26.0–34.0)
MCHC: 30 g/dL (ref 30.0–36.0)
MCV: 102 fL — ABNORMAL HIGH (ref 80.0–100.0)
Platelets: 93 10*3/uL — ABNORMAL LOW (ref 150–400)
RBC: 2.48 MIL/uL — ABNORMAL LOW (ref 3.87–5.11)
RDW: 22.1 % — ABNORMAL HIGH (ref 11.5–15.5)
WBC: 3.2 10*3/uL — ABNORMAL LOW (ref 4.0–10.5)
nRBC: 0 % (ref 0.0–0.2)

## 2020-07-02 LAB — BASIC METABOLIC PANEL
Anion gap: 9 (ref 5–15)
BUN: 32 mg/dL — ABNORMAL HIGH (ref 8–23)
CO2: 22 mmol/L (ref 22–32)
Calcium: 8.2 mg/dL — ABNORMAL LOW (ref 8.9–10.3)
Chloride: 112 mmol/L — ABNORMAL HIGH (ref 98–111)
Creatinine, Ser: 1.53 mg/dL — ABNORMAL HIGH (ref 0.44–1.00)
GFR, Estimated: 35 mL/min — ABNORMAL LOW (ref >60)
Glucose, Bld: 139 mg/dL — ABNORMAL HIGH (ref 70–99)
Potassium: 4.4 mmol/L (ref 3.5–5.1)
Sodium: 143 mmol/L (ref 135–145)

## 2020-07-02 LAB — GLUCOSE, CAPILLARY
Glucose-Capillary: 135 mg/dL — ABNORMAL HIGH (ref 70–99)
Glucose-Capillary: 202 mg/dL — ABNORMAL HIGH (ref 70–99)
Glucose-Capillary: 111 mg/dL — ABNORMAL HIGH (ref 70–99)
Glucose-Capillary: 211 mg/dL — ABNORMAL HIGH (ref 70–99)

## 2020-07-02 LAB — MAGNESIUM: Magnesium: 2 mg/dL (ref 1.7–2.4)

## 2020-07-02 LAB — HEMOGLOBIN AND HEMATOCRIT, BLOOD
HCT: 27.2 % — ABNORMAL LOW (ref 36.0–46.0)
Hemoglobin: 8.4 g/dL — ABNORMAL LOW (ref 12.0–15.0)

## 2020-07-02 MED ORDER — FUROSEMIDE 10 MG/ML IJ SOLN
20.0000 mg | Freq: Once | INTRAMUSCULAR | Status: AC
  Administered 2020-07-02: 12:00:00 20 mg via INTRAVENOUS
  Filled 2020-07-02: qty 2

## 2020-07-02 MED ORDER — SUCRALFATE 1 GM/10ML PO SUSP
1.0000 g | Freq: Four times a day (QID) | ORAL | Status: DC
  Administered 2020-07-02 – 2020-07-05 (×13): 1 g via ORAL
  Filled 2020-07-02 (×13): qty 10

## 2020-07-02 MED ORDER — LORAZEPAM 0.5 MG PO TABS
0.5000 mg | ORAL_TABLET | Freq: Two times a day (BID) | ORAL | Status: DC | PRN
  Administered 2020-07-02 – 2020-07-05 (×2): 0.5 mg via ORAL
  Filled 2020-07-02 (×2): qty 1

## 2020-07-02 MED ORDER — ENSURE ENLIVE PO LIQD: 237.0000 mL | Freq: Two times a day (BID) | ORAL | Status: DC

## 2020-07-02 NOTE — Progress Notes (Signed)
PROGRESS NOTE    Cheryl Jordan  VOH:[id number] DOB: 18-Dec-1940 DOA: 06/30/2020 PCP: Brunetta Jeans, PA-C    Chief Complaint  Patient presents with  . Abnormal Lab  . Weakness    Brief Narrative:  79 year old female with a past medical history of colon cancer, RLE DVT on Xarelto, hypothyroidism, cirrhosis, esophageal varices and portal hypertensive gastropathy with multiple recent hospitalizations for GI bleeding and was recently discharged on 06/18/20 for the same who presented to the ED on 06/30/2020 for recurrent rectal bleeding starting a day prior to presentation.Patient was seen by her oncologist, Dr. Benay Spice, on 06/30/2020 for a 46-month follow-up who ordered an FOBT and CBC in response to these symptoms and she was found to have an Hb 6.4, Hb 9.6 on 12/9. FOBT was positive.  She had complained of some lightheadedness as well.  She was started on IV Protonix and octreotide.  GI was consulted.   Assessment & Plan:   Principal Problem:   GI bleed Active Problems:   Hypothyroidism   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HCC)   Hypertension associated with diabetes ([place])   Symptomatic anemia   Acute blood loss anemia   Colon ulcer  1 recurrent GI bleed/acute on chronic blood loss anemia/symptomatic anemia Patient presented with rectal bleeding, noted to have a hemoglobin of 6.4 on admission.  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 Hemoglobin currently at 7.6 this morning from 8.5.  Status post transfusion 2 units packed red blood cells 06/30/2020.   Patient underwent upper endoscopy 07/01/2020 that showed portal hypertensive gastropathy, clotted blood in gastric antrum and prepyloric region of the stomach, GAVE with  bleeding status post APC and clips.  Patient underwent colonoscopy 07/01/2020 which showed scar at colonic anastomosis status post clip placement, post polypectomy scar and rectum status post clip placed.  Currently on Protonix drip and transition to IV PPI twice daily tomorrow, continue IV octreotide, IV Rocephin.  Start sucralfate as recommended.  Endoscopy report.  Hold Xarelto for 7 days per GI recommendations.  Repeat H&H this afternoon.  Transfusion threshold hemoglobin < 7.  GI following.  2.  Right lower extremity DVT Diagnosed 05/10/2020.  Currently on Xarelto which is on hold secondary to problem #1.  GI recommended Xarelto may be resumed in 7 days.  Follow.  3.  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  On IV octreotide, IV Rocephin.  Per GI.  4.  Acute kidney injury on chronic kidney disease stage IIIb Likely secondary to problem #1.  Creatinine on 06/23/2020 was 1.25.  On presentation creatinine 1.97.  Creatinine slowly improving and currently at 1.53.  Patient received a dose of Lasix IV x1.  Follow.  5.  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  Follow.  6.  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 Benay Spice.  7.  2.8 x 2.5 cm complex hypodense abnormality right hepatic lobe concerning for [facility] Per GI.  8.  Hypothyroidism Continue Synthroid.  9.  Well-controlled diabetes mellitus type 2 Hemoglobin A1c 6.2 (04/29/2020).  CBG of 202 this morning.  Sliding scale insulin.  10.  Hypertension Stable.  Continue Norvasc.    DVT prophylaxis: SCDs Code  Status: Partial Family Communication: Updated patient.  No family at bedside. Disposition:   Status is: Inpatient    Dispo: The patient is from: Home              Anticipated d/c is to: Home              Anticipated  d/c date is: 2 to 3 days.              Patient currently on IV octreotide, presenting with a GI bleed, not stable for discharge.       Consultants:   Gastroenterology: Dr. Silverio Decamp 06/30/2020  Procedures:   Upper endoscopy 07/01/2020 per Dr. Silverio Decamp  Colonoscopy 07/01/2020 per Dr. Silverio Decamp  Transfusion 2 units packed red blood cells 06/30/2020  Antimicrobials:   IV Rocephin 06/30/2020>>>>>   Subjective: Patient laying in bed.  Had some complaints of some shortness of breath early on this morning.  Complaining of some upper abdominal/epigastric intermittent pain since upper endoscopy.  States had brown stool this morning.  Denies any further rectal bleeding.  Objective: Vitals:   07/01/20 1747 07/01/20 2139 07/02/20 0541 07/02/20 0816  BP: (!) 156/61 133/60 (!) 139/58 (!) 142/67  Pulse: 66 69 60 65  Resp: 15 17 17 20   Temp: 97.8 F (36.6 C) 97.6 F (36.4 C) 97.6 F (36.4 C) 97.9 F (36.6 C)  TempSrc: Oral   Oral  SpO2: 94% 95% 93% 98%  Weight:      Height:        Intake/Output Summary (Last 24 hours) at 07/02/2020 1205 Last data filed at 07/02/2020 0541 Gross per 24 hour  Intake 1305.81 ml  Output 1150 ml  Net 155.81 ml   Filed Weights   06/30/20 2002  Weight: 94.7 kg    Examination:  General exam: Appears calm and comfortable  Respiratory system: Minimal bibasilar crackles otherwise clear.  No wheezing.  No use of accessory muscles of respiration.  Speaking in full sentences.  Cardiovascular system: S1 & S2 heard, RRR. No JVD, murmurs, rubs, gallops or clicks. No pedal edema. Gastrointestinal system: Abdomen is nondistended, soft and nontender. No organomegaly or masses felt. Normal bowel sounds heard. Central nervous system: Alert and oriented. No focal neurological deficits. Extremities: Symmetric 5 x 5 power. Skin: No rashes, lesions or ulcers Psychiatry: Judgement and insight appear normal. Mood & affect appropriate.     Data Reviewed: I have  personally reviewed following labs and imaging studies  CBC: Recent Labs  Lab 06/30/20 1150 07/01/20 0032 07/02/20 0529  WBC 5.9 4.8 3.2*  NEUTROABS 3.2  --   --   HGB 6.4* 8.5*  8.6* 7.6*  HCT 21.3* 27.7*  27.0* 25.3*  MCV 104.9* 100.0 102.0*  PLT 140* 115* 93*    Basic Metabolic Panel: Recent Labs  Lab 06/30/20 1301 07/01/20 0032 07/02/20 0529  NA 140 142 143  K 4.3 5.0 4.4  CL 109 111 112*  CO2 23 20* 22  GLUCOSE 116* 181* 139*  BUN 49* 44* 32*  CREATININE 1.97* 1.93* 1.53*  CALCIUM 8.8* 8.6* 8.2*  MG  --   --  2.0    GFR: Estimated Creatinine Clearance: 35.8 mL/min (A) (by C-G formula based on SCr of 1.53 mg/dL (H)).  Liver Function Tests: Recent Labs  Lab 06/30/20 1301  AST 27  ALT 17  ALKPHOS 69  BILITOT 1.0  PROT 5.7*  ALBUMIN 2.9*    CBG: Recent Labs  Lab 07/01/20 1141 07/01/20 1744 07/01/20 2138 07/02/20 0737  07/02/20 1141  GLUCAP 130* 106* 132* 135* 202*     Recent Results (from the past 240 hour(s))  Resp Panel by RT-PCR (Flu A&B, Covid) Nasopharyngeal Swab     Status: None   Collection Time: 06/30/20  1:26 PM   Specimen: Nasopharyngeal Swab; Nasopharyngeal(NP) swabs in vial transport medium  Result Value Ref Range Status   SARS Coronavirus 2 by RT PCR NEGATIVE NEGATIVE Final    Comment: (NOTE) SARS-CoV-2 target nucleic acids are NOT DETECTED.  The SARS-CoV-2 RNA is generally detectable in upper respiratory specimens during the acute phase of infection. The lowest concentration of SARS-CoV-2 viral copies this assay can detect is 138 copies/mL. A negative result does not preclude SARS-Cov-2 infection and should not be used as the sole basis for treatment or other patient management decisions. A negative result may occur with  improper specimen collection/handling, submission of specimen other than nasopharyngeal swab, presence of viral mutation(s) within the areas targeted by this assay, and inadequate number of  viral copies(<138 copies/mL). A negative result must be combined with clinical observations, patient history, and epidemiological information. The expected result is Negative.  Fact Sheet for Patients:  EntrepreneurPulse.com.au  Fact Sheet for Healthcare Providers:  IncredibleEmployment.be  This test is no t yet approved or cleared by the [place] FDA and  has been authorized for detection and/or diagnosis of SARS-CoV-2 by FDA under an Emergency Use Authorization (EUA). This EUA will remain  in effect (meaning this test can be used) for the duration of the COVID-19 declaration under Section 564(b)(1) of the Act, 21 U.S.C.section 360bbb-3(b)(1), unless the authorization is terminated  or revoked sooner.       Influenza A by PCR NEGATIVE NEGATIVE Final   Influenza B by PCR NEGATIVE NEGATIVE Final    Comment: (NOTE) The Xpert Xpress SARS-CoV-2/FLU/RSV plus assay is intended as an aid in the diagnosis of influenza from Nasopharyngeal swab specimens and should not be used as a sole basis for treatment. Nasal washings and aspirates are unacceptable for Xpert Xpress SARS-CoV-2/FLU/RSV testing.  Fact Sheet for Patients: EntrepreneurPulse.com.au  Fact Sheet for Healthcare Providers: IncredibleEmployment.be  This test is not yet approved or cleared by the [place] FDA and has been authorized for detection and/or diagnosis of SARS-CoV-2 by FDA under an Emergency Use Authorization (EUA). This EUA will remain in effect (meaning this test can be used) for the duration of the COVID-19 declaration under Section 564(b)(1) of the Act, 21 U.S.C. section 360bbb-3(b)(1), unless the authorization is terminated or revoked.  Performed at [facility], [place] [address] [place], [place] [address]          Radiology Studies: No results found.      Scheduled Meds: . amLODipine  2.5 mg  Oral Daily  . insulin aspart  0-9 Units Subcutaneous TID WC  . levothyroxine  88 mcg Oral Daily  . propranolol  40 mg Oral BID  . sucralfate  1 g Oral Q6H   Continuous Infusions: . cefTRIAXone (ROCEPHIN)  IV 2 g (07/01/20 1749)  . octreotide  (SANDOSTATIN)    IV infusion 50 mcg/hr (07/02/20 1039)  . pantoprozole (PROTONIX) infusion 8 mg/hr (07/02/20 0535)     LOS: 2 days    Time spent: 40 minutes    Irine Seal, MD [facility]   To contact the attending provider between 7A-7P or the covering provider during after hours 7P-7A, please log into the web site [email or web address] and access using universal [place] password for that web  site. If you do not have the password, please call the hospital operator.  07/02/2020, 12:05 PM

## 2020-07-02 NOTE — Progress Notes (Signed)
Initial Nutrition Assessment  DOCUMENTATION CODES:   Obesity unspecified  INTERVENTION:  Ensure Enlive po BID, each supplement provides 350 kcal and 20 grams of protein    NUTRITION DIAGNOSIS:   Increased nutrient needs related to acute illness as evidenced by estimated needs.    GOAL:   Patient will meet greater than or equal to 90% of their needs    MONITOR:   Labs,Diet advancement,Supplement acceptance,PO intake,Weight trends  REASON FOR ASSESSMENT:   Malnutrition Screening Tool    ASSESSMENT:   79 year old female with history of colon cancer s/p resection followed by adjunctive chemotherapy, RLE DVT on Xarelto, DM2, hypothyroid cirrhosis, esophageal varices and portal hypertensive gastropathy with multiple recent hospitalizations for GIB and recently discharged on 12/4 presented for recurrent rectal bleeding. Patient found to have positive FOBT and Hgb 6.4 at outpatient oncology follow-up and admitted for GIB.  RD working remotely.  Patient is s/p EGD and colonoscopy on 12/17   RD attempted to contact pt via phone, however no answer. Will plan to obtain nutrition history at follow-up. Patient eating 100% x 1 documented meal on soft diet. Per chart, weights have trended down ~6 lbs (2.9%) in the last month and ~11 lbs (5.1%) over the past 3 months which is insignificant for time frame, however concerning given advanced age and significant past medical history. Will order Ensure supplement to aid with meeting needs and will plan to complete exam at follow-up.  Mild pitting BLE edema noted.  Medications reviewed and include: SSI, Inderal, Carafate, Rocephin, Protonix 80 mg in NaCl 0.9% 100 ml (8 mg/hr)  Labs: CBGs 779,390,300, BUN 32 (H), Cr 1.53 (H), WBC 3.2 (L), Hgb 7.6 (L), HCT 25.3 (L) A1c 6.2 on 04/29/20 (well controlled)  Per notes: -GI following -start sucralfate, hold Xarelto x 7 days per GI -transfuse Hgb <7, s/p 2 unit PRBC  NUTRITION - FOCUSED PHYSICAL  EXAM:  Unable to complete at this time  Diet Order:   Diet Order            DIET SOFT Room service appropriate? Yes; Fluid consistency: Thin  Diet effective now                 EDUCATION NEEDS:   No education needs have been identified at this time  Skin:  Skin Assessment: Reviewed RN Assessment  Last BM:  12/17; type 7  Height:   Ht Readings from Last 1 Encounters:  06/30/20 5\' 7"  (1.702 m)    Weight:   Wt Readings from Last 1 Encounters:  06/30/20 94.7 kg    BMI:  Body mass index is 32.7 kg/m.  Estimated Nutritional Needs:   Kcal:  9233-0076  Protein:  115-125  Fluid:  >/= 2.2 L    Lajuan Lines, RD, LDN Clinical Nutrition After Hours/Weekend Pager # in [place]

## 2020-07-02 NOTE — Evaluation (Signed)
Physical Therapy Evaluation Patient Details Name: Cheryl Jordan MRN: [id number] DOB: Dec 11, 1940 Today's Date: 07/02/2020   History of Present Illness  79 year old female with a past medical history of colon cancer, RLE DVT on Xarelto, hypothyroidism, cirrhosis, esophageal varices and portal hypertensive gastropathy with multiple recent hospitalizations for GI bleeding and was recently discharged on 06/18/20 for the same who presented to the ED on 06/30/2020 for recurrent rectal bleeding  Clinical Impression  Pt admitted with above diagnosis. Pt lethargic due to recent anxiety medication. Assessed orthostatic vitals, she was not dizzy with position changes (supine 134/64, sitting 144/62, standing 134/64). Pt stated she was too sleepy to attempt ambulation, but that she ambulates with a cane independently at baseline.  Pt currently with functional limitations due to the deficits listed below (see PT Problem List). Pt will benefit from skilled PT to increase their independence and safety with mobility to allow discharge to the venue listed below.       Follow Up Recommendations No PT follow up    Equipment Recommendations  None recommended by PT    Recommendations for Other Services       Precautions / Restrictions Precautions Precautions: None Precaution Comments: pt denies h/o falls Restrictions Weight Bearing Restrictions: No      Mobility  Bed Mobility Overal bed mobility: Modified Independent             General bed mobility comments: used bedrail    Transfers Overall transfer level: Needs assistance Equipment used: None Transfers: Sit to/from Stand Sit to Stand: Independent         General transfer comment: pt denied dizziness with position changes, BP supine 134/64, sitting 144/62, standing 134/64. Pt reported she was too sleepy from anxiety pill to attempt ambulation.  Ambulation/Gait                Stairs            Wheelchair Mobility     Modified Rankin (Stroke Patients Only)       Balance Overall balance assessment: Needs assistance Sitting-balance support: Feet supported;No upper extremity supported Sitting balance-Leahy Scale: Good     Standing balance support: No upper extremity supported Standing balance-Leahy Scale: Good                               Pertinent Vitals/Pain Pain Assessment: No/denies pain    Home Living Family/patient expects to be discharged to:: Private residence Living Arrangements: Spouse/significant other Available Help at Discharge: Family;Available 24 hours/day Type of Home: House Home Access: Stairs to enter Entrance Stairs-Rails: None Entrance Stairs-Number of Steps: 1-2 Home Layout: One level;Laundry or work area in [place]: Kasandra Knudsen - single point;Walker - 2 wheels      Prior Function Level of Independence: Independent with assistive device(s)         Comments: walked with SPC PTA     Hand Dominance        Extremity/Trunk Assessment   Upper Extremity Assessment Upper Extremity Assessment: Overall WFL for tasks assessed    Lower Extremity Assessment Lower Extremity Assessment: Overall WFL for tasks assessed    Cervical / Trunk Assessment Cervical / Trunk Assessment: Normal  Communication   Communication: No difficulties  Cognition Arousal/Alertness: Lethargic;Suspect due to medications (pt reports she recently received medication for anxiety that makes her sleepy) Behavior During Therapy: WFL for tasks assessed/performed Overall Cognitive Status: Within Functional Limits for tasks assessed  General Comments      Exercises     Assessment/Plan    PT Assessment Patient needs continued PT services  PT Problem List Decreased activity tolerance       PT Treatment Interventions Gait training;Therapeutic exercise    PT Goals (Current goals can be found in the Care Plan  section)  Acute Rehab PT Goals Patient Stated Goal: return to baseline with independence PT Goal Formulation: With patient Time For Goal Achievement: 07/16/20 Potential to Achieve Goals: Good    Frequency Min 3X/week   Barriers to discharge        Co-evaluation               AM-PAC PT "6 Clicks" Mobility  Outcome Measure Help needed turning from your back to your side while in a flat bed without using bedrails?: None Help needed moving from lying on your back to sitting on the side of a flat bed without using bedrails?: None Help needed moving to and from a bed to a chair (including a wheelchair)?: None Help needed standing up from a chair using your arms (e.g., wheelchair or bedside chair)?: None Help needed to walk in hospital room?: A Little Help needed climbing 3-5 steps with a railing? : A Little 6 Click Score: 22    End of Session Equipment Utilized During Treatment: Gait belt Activity Tolerance: Patient tolerated treatment well Patient left: in bed;with call bell/phone within reach Nurse Communication: Mobility status PT Visit Diagnosis: Difficulty in walking, not elsewhere classified (R26.2)    Time: 3612-2449 PT Time Calculation (min) (ACUTE ONLY): 12 min   Charges:   PT Evaluation $PT Eval Low Complexity: 1 Low          Philomena Doheny PT 07/02/2020  Acute Rehabilitation Services Pager [phone] Office [phone]

## 2020-07-03 ENCOUNTER — Encounter (HOSPITAL_COMMUNITY): Payer: Self-pay | Admitting: Gastroenterology

## 2020-07-03 DIAGNOSIS — Z7189 Other specified counseling: Secondary | ICD-10-CM

## 2020-07-03 DIAGNOSIS — Z515 Encounter for palliative care: Secondary | ICD-10-CM

## 2020-07-03 LAB — CBC
HCT: 26.3 % — ABNORMAL LOW (ref 36.0–46.0)
Hemoglobin: 8 g/dL — ABNORMAL LOW (ref 12.0–15.0)
MCH: 31.1 pg (ref 26.0–34.0)
MCHC: 30.4 g/dL (ref 30.0–36.0)
MCV: 102.3 fL — ABNORMAL HIGH (ref 80.0–100.0)
Platelets: 96 10*3/uL — ABNORMAL LOW (ref 150–400)
RBC: 2.57 MIL/uL — ABNORMAL LOW (ref 3.87–5.11)
RDW: 21.4 % — ABNORMAL HIGH (ref 11.5–15.5)
WBC: 3.8 10*3/uL — ABNORMAL LOW (ref 4.0–10.5)
nRBC: 0 % (ref 0.0–0.2)

## 2020-07-03 LAB — GLUCOSE, CAPILLARY
Glucose-Capillary: 178 mg/dL — ABNORMAL HIGH (ref 70–99)
Glucose-Capillary: 145 mg/dL — ABNORMAL HIGH (ref 70–99)
Glucose-Capillary: 146 mg/dL — ABNORMAL HIGH (ref 70–99)
Glucose-Capillary: 145 mg/dL — ABNORMAL HIGH (ref 70–99)

## 2020-07-03 LAB — BASIC METABOLIC PANEL
Anion gap: 10 (ref 5–15)
BUN: 25 mg/dL — ABNORMAL HIGH (ref 8–23)
CO2: 21 mmol/L — ABNORMAL LOW (ref 22–32)
Calcium: 8.2 mg/dL — ABNORMAL LOW (ref 8.9–10.3)
Chloride: 110 mmol/L (ref 98–111)
Creatinine, Ser: 1.49 mg/dL — ABNORMAL HIGH (ref 0.44–1.00)
GFR, Estimated: 36 mL/min — ABNORMAL LOW (ref >60)
Glucose, Bld: 170 mg/dL — ABNORMAL HIGH (ref 70–99)
Potassium: 4.2 mmol/L (ref 3.5–5.1)
Sodium: 141 mmol/L (ref 135–145)

## 2020-07-03 MED ORDER — ONDANSETRON HCL 4 MG/2ML IJ SOLN
4.0000 mg | Freq: Four times a day (QID) | INTRAMUSCULAR | Status: DC | PRN
  Administered 2020-07-03 – 2020-07-05 (×4): 4 mg via INTRAVENOUS
  Filled 2020-07-03 (×4): qty 2

## 2020-07-03 MED ORDER — PANTOPRAZOLE SODIUM 40 MG IV SOLR
40.0000 mg | Freq: Two times a day (BID) | INTRAVENOUS | Status: DC
  Administered 2020-07-03 – 2020-07-05 (×4): 40 mg via INTRAVENOUS
  Filled 2020-07-03 (×5): qty 40

## 2020-07-03 MED ORDER — SODIUM CHLORIDE (PF) 0.9 % IJ SOLN
INTRAMUSCULAR | Status: AC
  Administered 2020-07-03: 19:00:00 10 mL
  Filled 2020-07-03: qty 10

## 2020-07-03 MED ORDER — SIMETHICONE 40 MG/0.6ML PO SUSP
40.0000 mg | Freq: Once | ORAL | Status: AC
  Administered 2020-07-03: 40 mg via ORAL
  Filled 2020-07-03: qty 0.6

## 2020-07-03 NOTE — Progress Notes (Signed)
Palliative Medicine Team consult was received.   Ms. Pizzolato was unable to discuss as she was receiving personal care at time I attempted to see her.  Will plan to f/u later today vs tomorrow.  If there are urgent needs or questions please call [phone]. Thank you for consulting out team to assist with this patients care.  Micheline Rough, MD [place] Palliative Medicine Team [phone]  NO CHARGE NOTE

## 2020-07-03 NOTE — Progress Notes (Addendum)
PROGRESS NOTE    Cheryl Jordan  KDT:[id number] DOB: [birth year] DOA: 06/30/2020 PCP: Brunetta Jeans, PA-C    Chief Complaint  Patient presents with  . Abnormal Lab  . Weakness    Brief Narrative:  79 year old female with a past medical history of colon cancer, RLE DVT on Xarelto, hypothyroidism, cirrhosis, esophageal varices and portal hypertensive gastropathy with multiple recent hospitalizations for GI bleeding and was recently discharged on 06/18/20 for the same who presented to the ED on 06/30/2020 for recurrent rectal bleeding starting a day prior to presentation.Patient was seen by her oncologist, Dr. Benay Spice, on 06/30/2020 for a 71-month follow-up who ordered an FOBT and CBC in response to these symptoms and she was found to have an Hb 6.4, Hb 9.6 on 12/9. FOBT was positive.  She had complained of some lightheadedness as well.  She was started on IV Protonix and octreotide.  GI was consulted.   Assessment & Plan:   Principal Problem:   GI bleed Active Problems:   Hypothyroidism   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[place])   Hypertension associated with diabetes ([place])   Symptomatic anemia   Acute blood loss anemia   Colon ulcer   Deep venous thrombosis (HCC)  1 recurrent GI bleed/acute on chronic blood loss anemia/symptomatic anemia Patient presented with rectal bleeding, noted to have a hemoglobin of 6.4 on admission.  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 Hemoglobin stable at 8.0 this morning from 7.6 from 8.5 from 8.6.  Status post transfusion 2 units packed red blood cells 06/30/2020. Patient underwent upper endoscopy 07/01/2020 that showed portal hypertensive gastropathy, clotted blood in gastric antrum and  prepyloric region of the stomach, GAVE with bleeding status post APC and clips.  Patient underwent colonoscopy 07/01/2020 which showed scar at colonic anastomosis status post clip placement, post polypectomy scar and rectum status post clip placed.  Currently on Protonix drip which will transition to IV PPI twice daily today and if continued improvement could transition to oral PPI twice daily tomorrow.  On octreotide with last dose today.  On IV Rocephin.  Continue Carafate as recommended by GI on endoscopy report.  Continue to hold Xarelto for total of 7 days per GI recommendations.  Transfusion threshold hemoglobin < 7.  GI following.   2.  Right lower extremity DVT Diagnosed 05/10/2020.  Currently on Xarelto which is on hold secondary to problem #1.  GI recommended Xarelto may be resumed in 7 days.  Follow.  3.  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  On octreotide with last dose today.  On IV Rocephin.  Per GI.    4.  Acute kidney injury on chronic kidney disease stage IIIb Likely secondary to problem #1.  Creatinine on 06/23/2020 was 1.25.  On presentation creatinine 1.97.  Creatinine trending down currently at 1.49.  Status post dose of IV Lasix with urine output of 1.2 L over the past 24 hours.  Follow.    5.  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  Patient with no overt bleeding.  Platelet count at 96.  Follow.    6.  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 Benay Spice.  7.  2.8 x 2.5 cm complex hypodense abnormality right hepatic lobe concerning for [facility] Per GI.  8.  Hypothyroidism Synthroid  9.  Well-controlled diabetes mellitus type 2 Hemoglobin A1c 6.2 (04/29/2020).  CBG 178 this morning.  Continue sliding scale insulin.   10.  Hypertension Controlled on current  regimen Norvasc.     DVT prophylaxis: SCDs Code Status: Partial Family Communication: Updated patient.  Updated son Cheryl Jordan on the telephone.  Disposition:   Status is: Inpatient    Dispo: The patient is from: Home              Anticipated d/c is to: Home              Anticipated d/c date is: 1-2 days.              Patient currently on octreotide, presenting with a GI bleed, not stable for discharge.       Consultants:   Gastroenterology: Dr. Silverio Decamp 06/30/2020  Procedures:   Upper endoscopy 07/01/2020 per Dr. Silverio Decamp  Colonoscopy 07/01/2020 per Dr. Silverio Decamp  Transfusion 2 units packed red blood cells 06/30/2020  Antimicrobials:   IV Rocephin 06/30/2020>>>>>   Subjective: Patient laying in bed.  Patient c/o intermittent nausea and abd gurgling. Was SOB earlier on this morning, but currently not SOB.  States had brown stool this morning.  Objective: Vitals:   07/02/20 1330 07/02/20 2124 07/03/20 0355 07/03/20 0500  BP: (!) 135/59 140/61 (!) 137/51   Pulse: 60 63 67   Resp: 18 18 16    Temp: 98.2 F (36.8 C) 98 F (36.7 C) 97.9 F (36.6 C)   TempSrc: Oral Oral Oral   SpO2: 93% 96% 93%   Weight:    98.5 kg  Height:        Intake/Output Summary (Last 24 hours) at 07/03/2020 1028 Last data filed at 07/03/2020 0609 Gross per 24 hour  Intake 669.95 ml  Output 1200 ml  Net -530.05 ml   Filed Weights   06/30/20 2002 07/03/20 0500  Weight: 94.7 kg 98.5 kg    Examination:  General exam: NAD. Respiratory system: Lungs clear to auscultation bilaterally.  No wheezes, no crackles, no rhonchi.  Normal respiratory effort.  No use of accessory muscles of respiration.  Speaking in full sentences. Cardiovascular system: Regular rate rhythm no murmurs rubs or gallops.  No JVD.  No lower extremity edema.  Gastrointestinal system: Abdomen is soft, nondistended, some abdominal discomfort in the epigastric region on palpation, positive bowel sounds.  No rebound.   No guarding. Central nervous system: Alert and oriented. No focal neurological deficits. Extremities: Symmetric 5 x 5 power. Skin: No rashes, lesions or ulcers Psychiatry: Judgement and insight appear normal. Mood & affect appropriate.     Data Reviewed: I have personally reviewed following labs and imaging studies  CBC: Recent Labs  Lab 06/30/20 1150 07/01/20 0032 07/02/20 0529 07/02/20 1433 07/03/20 0520  WBC 5.9 4.8 3.2*  --  3.8*  NEUTROABS 3.2  --   --   --   --   HGB 6.4* 8.5*  8.6* 7.6* 8.4* 8.0*  HCT 21.3* 27.7*  27.0* 25.3* 27.2* 26.3*  MCV 104.9* 100.0 102.0*  --  102.3*  PLT 140* 115* 93*  --  96*    Basic Metabolic Panel: Recent Labs  Lab 06/30/20 1301 07/01/20 0032 07/02/20 0529 07/03/20 0520  NA 140 142 143 141  K 4.3 5.0 4.4 4.2  CL 109 111 112* 110  CO2 23 20* 22  21*  GLUCOSE 116* 181* 139* 170*  BUN 49* 44* 32* 25*  CREATININE 1.97* 1.93* 1.53* 1.49*  CALCIUM 8.8* 8.6* 8.2* 8.2*  MG  --   --  2.0  --     GFR: Estimated Creatinine Clearance: 37.5 mL/min (A) (by C-G formula based on SCr of 1.49 mg/dL (H)).  Liver Function Tests: Recent Labs  Lab 06/30/20 1301  AST 27  ALT 17  ALKPHOS 69  BILITOT 1.0  PROT 5.7*  ALBUMIN 2.9*    CBG: Recent Labs  Lab 07/02/20 0737 07/02/20 1141 07/02/20 1618 07/02/20 2126 07/03/20 0737  GLUCAP 135* 202* 111* 211* 178*     Recent Results (from the past 240 hour(s))  Resp Panel by RT-PCR (Flu A&B, Covid) Nasopharyngeal Swab     Status: None   Collection Time: 06/30/20  1:26 PM   Specimen: Nasopharyngeal Swab; Nasopharyngeal(NP) swabs in vial transport medium  Result Value Ref Range Status   SARS Coronavirus 2 by RT PCR NEGATIVE NEGATIVE Final    Comment: (NOTE) SARS-CoV-2 target nucleic acids are NOT DETECTED.  The SARS-CoV-2 RNA is generally detectable in upper respiratory specimens during the acute phase of infection. The lowest concentration of SARS-CoV-2 viral copies this assay can  detect is 138 copies/mL. A negative result does not preclude SARS-Cov-2 infection and should not be used as the sole basis for treatment or other patient management decisions. A negative result may occur with  improper specimen collection/handling, submission of specimen other than nasopharyngeal swab, presence of viral mutation(s) within the areas targeted by this assay, and inadequate number of viral copies(<138 copies/mL). A negative result must be combined with clinical observations, patient history, and epidemiological information. The expected result is Negative.  Fact Sheet for Patients:  EntrepreneurPulse.com.au  Fact Sheet for Healthcare Providers:  IncredibleEmployment.be  This test is no t yet approved or cleared by the [place] FDA and  has been authorized for detection and/or diagnosis of SARS-CoV-2 by FDA under an Emergency Use Authorization (EUA). This EUA will remain  in effect (meaning this test can be used) for the duration of the COVID-19 declaration under Section 564(b)(1) of the Act, 21 U.S.C.section 360bbb-3(b)(1), unless the authorization is terminated  or revoked sooner.       Influenza A by PCR NEGATIVE NEGATIVE Final   Influenza B by PCR NEGATIVE NEGATIVE Final    Comment: (NOTE) The Xpert Xpress SARS-CoV-2/FLU/RSV plus assay is intended as an aid in the diagnosis of influenza from Nasopharyngeal swab specimens and should not be used as a sole basis for treatment. Nasal washings and aspirates are unacceptable for Xpert Xpress SARS-CoV-2/FLU/RSV testing.  Fact Sheet for Patients: EntrepreneurPulse.com.au  Fact Sheet for Healthcare Providers: IncredibleEmployment.be  This test is not yet approved or cleared by the [place] FDA and has been authorized for detection and/or diagnosis of SARS-CoV-2 by FDA under an Emergency Use Authorization (EUA). This EUA will remain in  effect (meaning this test can be used) for the duration of the COVID-19 declaration under Section 564(b)(1) of the Act, 21 U.S.C. section 360bbb-3(b)(1), unless the authorization is terminated or revoked.  Performed at [facility], [place] [address] [place], [place] [address]          Radiology Studies: No results found.      Scheduled Meds: . amLODipine  2.5 mg Oral Daily  . feeding supplement  237 mL Oral BID BM  . insulin aspart  0-9 Units Subcutaneous TID WC  . levothyroxine  88  mcg Oral Daily  . pantoprazole (PROTONIX) IV  40 mg Intravenous Q12H  . propranolol  40 mg Oral BID  . sucralfate  1 g Oral Q6H   Continuous Infusions: . cefTRIAXone (ROCEPHIN)  IV 2 g (07/02/20 1611)  . octreotide  (SANDOSTATIN)    IV infusion 50 mcg/hr (07/03/20 0950)  . pantoprozole (PROTONIX) infusion 8 mg/hr (07/03/20 0609)     LOS: 3 days    Time spent: 40 minutes    Irine Seal, MD [facility]   To contact the attending provider between 7A-7P or the covering provider during after hours 7P-7A, please log into the web site [email or web address] and access using universal [place] password for that web site. If you do not have the password, please call the hospital operator.  07/03/2020, 10:28 AM

## 2020-07-04 DIAGNOSIS — K7581 Nonalcoholic steatohepatitis (NASH): Secondary | ICD-10-CM

## 2020-07-04 DIAGNOSIS — R0602 Shortness of breath: Secondary | ICD-10-CM

## 2020-07-04 DIAGNOSIS — E8770 Fluid overload, unspecified: Secondary | ICD-10-CM | POA: Clinically undetermined

## 2020-07-04 DIAGNOSIS — K7460 Unspecified cirrhosis of liver: Secondary | ICD-10-CM

## 2020-07-04 DIAGNOSIS — D62 Acute posthemorrhagic anemia: Secondary | ICD-10-CM

## 2020-07-04 DIAGNOSIS — K3189 Other diseases of stomach and duodenum: Secondary | ICD-10-CM

## 2020-07-04 DIAGNOSIS — D689 Coagulation defect, unspecified: Secondary | ICD-10-CM

## 2020-07-04 DIAGNOSIS — K7469 Other cirrhosis of liver: Secondary | ICD-10-CM

## 2020-07-04 DIAGNOSIS — K766 Portal hypertension: Secondary | ICD-10-CM

## 2020-07-04 DIAGNOSIS — K7290 Hepatic failure, unspecified without coma: Secondary | ICD-10-CM

## 2020-07-04 LAB — BASIC METABOLIC PANEL
Anion gap: 12 (ref 5–15)
BUN: 21 mg/dL (ref 8–23)
CO2: 20 mmol/L — ABNORMAL LOW (ref 22–32)
Calcium: 8.3 mg/dL — ABNORMAL LOW (ref 8.9–10.3)
Chloride: 111 mmol/L (ref 98–111)
Creatinine, Ser: 1.47 mg/dL — ABNORMAL HIGH (ref 0.44–1.00)
GFR, Estimated: 36 mL/min — ABNORMAL LOW (ref >60)
Glucose, Bld: 156 mg/dL — ABNORMAL HIGH (ref 70–99)
Potassium: 4.3 mmol/L (ref 3.5–5.1)
Sodium: 143 mmol/L (ref 135–145)

## 2020-07-04 LAB — CBC
HCT: 27.7 % — ABNORMAL LOW (ref 36.0–46.0)
Hemoglobin: 8.2 g/dL — ABNORMAL LOW (ref 12.0–15.0)
MCH: 30.8 pg (ref 26.0–34.0)
MCHC: 29.6 g/dL — ABNORMAL LOW (ref 30.0–36.0)
MCV: 104.1 fL — ABNORMAL HIGH (ref 80.0–100.0)
Platelets: 95 10*3/uL — ABNORMAL LOW (ref 150–400)
RBC: 2.66 MIL/uL — ABNORMAL LOW (ref 3.87–5.11)
RDW: 21.2 % — ABNORMAL HIGH (ref 11.5–15.5)
WBC: 4.4 10*3/uL (ref 4.0–10.5)
nRBC: 0 % (ref 0.0–0.2)

## 2020-07-04 LAB — GLUCOSE, CAPILLARY
Glucose-Capillary: 143 mg/dL — ABNORMAL HIGH (ref 70–99)
Glucose-Capillary: 169 mg/dL — ABNORMAL HIGH (ref 70–99)
Glucose-Capillary: 121 mg/dL — ABNORMAL HIGH (ref 70–99)

## 2020-07-04 MED ORDER — SPIRONOLACTONE 25 MG PO TABS
50.0000 mg | ORAL_TABLET | Freq: Every day | ORAL | Status: DC
  Administered 2020-07-04 – 2020-07-05 (×2): 50 mg via ORAL
  Filled 2020-07-04 (×3): qty 2

## 2020-07-04 MED ORDER — FUROSEMIDE 10 MG/ML IJ SOLN
20.0000 mg | Freq: Two times a day (BID) | INTRAMUSCULAR | Status: DC
  Administered 2020-07-04 (×2): 20 mg via INTRAVENOUS
  Filled 2020-07-04 (×2): qty 2

## 2020-07-04 MED FILL — Insulin Aspart Inj 100 Unit/ML: SUBCUTANEOUS | Qty: 0.01 | Status: AC

## 2020-07-04 MED FILL — Insulin Aspart Inj 100 Unit/ML: SUBCUTANEOUS | Qty: 0.02 | Status: AC

## 2020-07-04 NOTE — Evaluation (Signed)
Occupational Therapy Evaluation Patient Details Name: Cheryl Jordan MRN: [id number] DOB: September 26, 1940 Today's Date: 07/04/2020    History of Present Illness 79 year old female with a past medical history of colon cancer, RLE DVT on Xarelto, hypothyroidism, cirrhosis, esophageal varices and portal hypertensive gastropathy with multiple recent hospitalizations for GI bleeding and was recently discharged on 06/18/20 for the same who presented to the ED on 06/30/2020 for recurrent rectal bleeding. EGD performed on 07/01/20.   Clinical Impression   Patient evaluated by Occupational Therapy with no further acute OT needs identified. All education has been completed and the patient has no further questions.  See below for any follow-up Occupational Therapy or equipment needs. OT is signing off. Thank you for this referral.     Follow Up Recommendations  No OT follow up    Equipment Recommendations       Recommendations for Other Services       Precautions / Restrictions Precautions Precautions: None Precaution Comments: pt denies h/o falls Restrictions Weight Bearing Restrictions: No      Mobility Bed Mobility               General bed mobility comments: Pt up in chair and returned to same.    Transfers     Transfers: Sit to/from Stand Sit to Stand: Independent (from EOB and recliner.)         General transfer comment: Supervision with descent to low standard commode. Mod I to stand.    Balance   Sitting-balance support: Feet supported;No upper extremity supported Sitting balance-Leahy Scale: Good     Standing balance support: Single extremity supported Standing balance-Leahy Scale: Good Standing balance comment: Static standing good without UE support.  Dynamic with pt holding IV pole.  Pt performed 30 Second Sit to Stand test from recliner, modified to allow use of UEs, and scored a 9 indicating Just under the norm for her age and sex which is 46, indicating a  mildly elevated fall risk.   After test pt reported an RPE of 4/10, indicating a light-moderate feeling of exertion.  No SHOB or wheezing.                           ADL either performed or assessed with clinical judgement   ADL Overall ADL's : At baseline;Modified independent                                       General ADL Comments: Pt able to demonstrate LE dressing with adapted figure 4 position at EOB Mod I. Toileting Mod I. Toilet transfer with supervision. In-room ambulation, holding IV pole Mod I. Please also refer to Mobility and Balance sections.     Vision   Vision Assessment?: No apparent visual deficits     Perception     Praxis      Pertinent Vitals/Pain Pain Assessment: No/denies pain     Hand Dominance Right   Extremity/Trunk Assessment Upper Extremity Assessment Upper Extremity Assessment: Overall WFL for tasks assessed   Lower Extremity Assessment Lower Extremity Assessment: Overall WFL for tasks assessed   Cervical / Trunk Assessment Cervical / Trunk Assessment: Normal   Communication Communication Communication: No difficulties   Cognition Arousal/Alertness: Awake/alert Behavior During Therapy: WFL for tasks assessed/performed Overall Cognitive Status: Within Functional Limits for tasks assessed  General Comments: Pleasant and cooperative.   General Comments       Exercises     Shoulder Instructions      Home Living Family/patient expects to be discharged to:: Private residence Living Arrangements: Spouse/significant other Available Help at Discharge: Family;Available 24 hours/day Type of Home: House Home Access: Stairs to enter [employer] of Steps: 1-2 Entrance Stairs-Rails: None       Bathroom Shower/Tub: Occupational psychologist: Handicapped height     Home Equipment: [place] - single point;Walker - 2 wheels;Shower seat - built in    Additional CommentsAmbulance person, but used by hisband.      Prior Functioning/Environment Level of Independence: Independent with assistive device(s)        Comments: Ambulates with use of her cane only as needed. Pt reports completing all BADL and IADLs independently. Pt's husband can assist with light duties.        OT Problem List:        OT Treatment/Interventions:      OT Goals(Current goals can be found in the care plan section) Acute Rehab OT Goals Patient Stated Goal: Pt expressed hope to go home today. OT Goal Formulation: With patient ADL Goals Additional ADL Goal #1: Pt will identify fall prevention strategies to reduce risk of injury and rehospitalization after discharge.  OT Frequency:     Barriers to D/C:            Co-evaluation              AM-PAC OT "6 Clicks" Daily Activity     Outcome Measure Help from another person eating meals?: None Help from another person taking care of personal grooming?: None Help from another person toileting, which includes using toliet, bedpan, or urinal?: None Help from another person bathing (including washing, rinsing, drying)?: None Help from another person to put on and taking off regular upper body clothing?: None Help from another person to put on and taking off regular lower body clothing?: None 6 Click Score: 24   End of Session Equipment Utilized During Treatment: Gait belt Nurse Communication: Other (comment) (Nurse present to observe mobility and 30 second sit to stand test)  Activity Tolerance: Patient tolerated treatment well Patient left: in chair;with call bell/phone within reach;with nursing/sitter in room                   Time: 1012-1047 OT Time Calculation (min): 35 min Charges:  OT General Charges $OT Visit: 1 Visit OT Evaluation $OT Eval Low Complexity: 1 Low OT Treatments $Self Care/Home Management : 8-22 mins  Anderson [place], OT Acute Rehab Services Office:  [phone] 07/04/2020   Julien Girt 07/04/2020, 12:54 PM

## 2020-07-04 NOTE — Progress Notes (Addendum)
[facility] Progress Note  CC:  Anemia, heme + stool   Subjective: She had a little nausea this am. No vomiting. She reported passing a soft dark green stool this morning. No abdominal pain. She has mild SOB at times, has difficulty catching her breath. She is using oxygen 2L [place] as needed, not in use at this time. Her pastor is at the bedside.   Objective:   S/P EGD 07/01/2020: - Z-line regular, 35 cm from the incisors. - No gross lesions in esophagus. - Portal hypertensive gastropathy. - Clotted blood in the gastric antrum a - Portal hypertensive gastropathy. - Clotted blood in the gastric antrum and in the prepyloric region of the stomach. - Gastric antral vascular ectasia with bleeding. Treated with argon plasma coagulation (APC). Clips (MR conditional) were placed. - Normal examined duodenum. - No specimens collected.  S/P colonoscopy 07/01/2020: - Scar at the colonic anastomosis. Clip (MR conditional) was placed. - Post-polypectomy scar in the rectum. Clips (MR conditional) were placed. - The examination was otherwise normal. - No specimens collected.  Vital signs in last 24 hours: Temp:  [97.5 F (36.4 C)-98 F (36.7 C)] 97.5 F (36.4 C) (12/20 0531) Pulse Rate:  [56-57] 56 (12/20 0531) Resp:  [16-18] 17 (12/20 0531) BP: (132-147)/(61-72) 147/72 (12/20 0531) SpO2:  [93 %-95 %] 95 % (12/20 0531) Last BM Date: 07/03/20 General:   Alert in NAD Heart: RRR, systolic murmur.  Pulm:  Breath sounds clear throughout.  Abdomen: Soft, nontender, + BS x 4 quads. No HSM.  Extremities:  Without edema. Neurologic:  Alert and  oriented x 4;  grossly normal neurologically. Psych:  Alert and cooperative. Normal mood and affect.  Intake/Output from previous day: 12/19 0701 - 12/20 0700 In: 1265.3 [P.O.:780; I.V.:385.3; IV Piggyback:100] Out: 1200 [Urine:1200] Intake/Output this shift: No intake/output data recorded.  Lab Results: Recent Labs     07/02/20 0529 07/02/20 1433 07/03/20 0520 07/04/20 0448  WBC 3.2*  --  3.8* 4.4  HGB 7.6* 8.4* 8.0* 8.2*  HCT 25.3* 27.2* 26.3* 27.7*  PLT 93*  --  96* 95*   BMET Recent Labs    07/02/20 0529 07/03/20 0520 07/04/20 0448  NA 143 141 143  K 4.4 4.2 4.3  CL 112* 110 111  CO2 22 21* 20*  GLUCOSE 139* 170* 156*  BUN 32* 25* 21  CREATININE 1.53* 1.49* 1.47*  CALCIUM 8.2* 8.2* 8.3*   LFT No results for input(s): PROT, ALBUMIN, AST, ALT, ALKPHOS, BILITOT, BILIDIR, IBILI in the last 72 hours. PT/INR No results for input(s): LABPROT, INR in the last 72 hours. Hepatitis Panel No results for input(s): HEPBSAG, HCVAB, HEPAIGM, HEPBIGM in the last 72 hours.  No results found.  Assessment / Plan:  34.  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  Admission Hg 6.4.k Transfused 2 units or PRBCs.  Previously admitted to the hospital 12/1 with anemia and GI heme + stool. 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  The colonoscopy identified patent ileocolonic anastomosis with intact staple line and visible sutures, Six  2 to 6 mm polyps were removed from the rectum, rectosigmoid, sigmoid, descending and transverse colon and two hemostatic clips were successfully placed.  Nonbleeding internal and external hemorrhoids were noted. Suspect recurrent UGI bleeding from gastric AVMs and lower GI bleeding from polypectomy site  with hemoclip found loose on rectal exam. S/P EGD and colonoscopy 12/17. EGD showed blood in the gastric antrum and prepyloric region, GAVE with bleeding treated with APC and clips were placed. The colonoscopy showed a scar at the colonic anastomosis, clip placed and post polypectomy scar in the rectum, clip placed. Hg stable 8.2. She reports passing soft green stool x 1 today. No rectal bleeding  or black stools. Some nausea. No abdominal pain. -Continue Pantoprazole 40mg  IV BID -Carafate 1 gm po QID x 3 months -Resume Xarelto (rivaroxaban) at prior dose on 12/24 -She remains on Octreotide, most likely can discontinue today, await further recommendations from Dr. Henrene Pastor [facility]  2.  History of cecal colon cancer stage IIIc status post resection July 2016 followed by adjunctive chemotherapy.  3. RLE DVT 05/10/2020 on Xarelto.  Last dose of Xarelto was taken at 6 PM on 12/14. -See plan in # 1  4. A 2.8 x 2.5cm complex hyperdense right liver lesion per CTAP 04/29/2020, concerning for HCC. -Abdominal MRI as an outpatient  5.  DM II  6. SOB, using oxygen 2L [place] as needed -Recommend chest xray, defer to the hospitalist     LOS: 4 days Cheryl Jordan  07/04/2020, 8:06 AM  GI ATTENDING  Interval history data reviewed.  Patient personally seen and examined.  Agree with interval progress note as outlined above.  Stable without complaints.  No further bleeding.  Tolerating diet. Recommend the following: 1.  Discontinue octreotide 2.  Is doing well overnight, may discharge home tomorrow 3.  Discharge home on pantoprazole 40 mg twice daily 4.  Discharge home on Carafate 1 g 4 times daily for 2 months 5.  Resume Xarelto 07/08/2020 6.  If recurrent bleeding on Xarelto, may need IVC filter for DVT 7.  General medical care with PCP 8.  Has GI follow-up with me July 25, 2020.  Keep that appointment I have discussed this with the patient.  Please call for problems or questions.  We will sign off.Docia Chuck Geri [place]., M.D. [facility] Division of Gastroenterology

## 2020-07-04 NOTE — Progress Notes (Signed)
PROGRESS NOTE    Cheryl Jordan  YWV:[id number] DOB: [birth year] DOA: 06/30/2020 PCP: Brunetta Jeans, PA-C    Chief Complaint  Patient presents with  . Abnormal Lab  . Weakness    Brief Narrative:  79 year old female with a past medical history of colon cancer, RLE DVT on Xarelto, hypothyroidism, cirrhosis, esophageal varices and portal hypertensive gastropathy with multiple recent hospitalizations for GI bleeding and was recently discharged on 06/18/20 for the same who presented to the ED on 06/30/2020 for recurrent rectal bleeding starting a day prior to presentation.Patient was seen by her oncologist, Dr. Benay Spice, on 06/30/2020 for a 81-month follow-up who ordered an FOBT and CBC in response to these symptoms and she was found to have an Hb 6.4, Hb 9.6 on 12/9. FOBT was positive.  She had complained of some lightheadedness as well.  She was started on IV Protonix and octreotide.  GI was consulted.   Assessment & Plan:   Principal Problem:   GI bleed Active Problems:   Hypothyroidism   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[place])   Hypertension associated with diabetes ([place])   SOB (shortness of breath)   Symptomatic anemia   Acute blood loss anemia   Colon ulcer   Deep venous thrombosis ([facility])   Volume overload   Other cirrhosis of liver ([facility])  1 recurrent GI bleed/acute on chronic blood loss anemia/symptomatic anemia Patient presented with rectal bleeding, noted to have a hemoglobin of 6.4 on admission.  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 Hemoglobin stable at 8.2 from 8.0 from 7.6 from 8.5 from 8.6.  Status post transfusion 2 units packed red blood cells 06/30/2020. Patient underwent upper endoscopy 07/01/2020 that  showed portal hypertensive gastropathy, clotted blood in gastric antrum and prepyloric region of the stomach, GAVE with bleeding status post APC and clips.  Patient underwent colonoscopy 07/01/2020 which showed scar at colonic anastomosis status post clip placement, post polypectomy scar and rectum status post clip placed.  Was on Protonix drip and has been transitioned to IV PPI twice daily and could likely transition to oral PPI tomorrow.  Status post octreotide.  On IV Rocephin.  Continue Carafate as recommended per GI on endoscopy report.  Continue to hold Xarelto for total of 7 days per GI recommendations.  Transfusion threshold hemoglobin < 7.  GI following.   2.  Right lower extremity DVT Diagnosed 05/10/2020.  Currently on Xarelto which is on hold secondary to problem #1.  GI recommended Xarelto may be resumed in 7 days.  Follow.  3.  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  Status post octreotide.  On IV Rocephin.  Patient with some complaints of intermittent shortness of breath and as such being placed on Lasix 20 mg IV every 12 hours x2 doses.  Resume home regimen spironolactone.  Per GI.  4.  Acute kidney injury on chronic kidney disease stage IIIb Likely secondary to problem #1.  Creatinine on 06/23/2020 was 1.25.  On presentation creatinine 1.97.  Creatinine trending down currently at 1.47.  Status post dose of IV Lasix with urine output of 1.2 L (07/02/2020 ).  Patient with some complaints of intermittent dyspnea and as such we will place on Lasix 20 mg IV every 12 hours.  Follow.   5.  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  Patient with no overt bleeding.  Platelet count at 95.  Follow.    6.  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 Benay Spice.  7.  2.8 x 2.5 cm complex  hypodense abnormality right hepatic lobe concerning for [facility] Per GI.  8.  Hypothyroidism Synthroid.   9.  Well-controlled diabetes mellitus type 2 Hemoglobin A1c 6.2 (04/29/2020).  CBG 143 this morning.  Sliding scale insulin.    10.  Hypertension Continue Norvasc.    11.  Intermittent dyspnea/volume overload Patient with some complaints of intermittent dyspnea.  Patient noted to be +3 L during this hospitalization.  Last weight noted in computer of 217.15 pounds (07/03/2020) from 208.78 pounds on admission.  Patient with some bibasilar crackles noted on examination.  Place on Lasix 20 mg IV every 12 hours.  Strict I's and O's.  Daily weights.  Follow.    DVT prophylaxis: SCDs Code Status: Partial Family Communication: Updated patient.  Disposition:   Status is: Inpatient    Dispo: The patient is from: Home              Anticipated d/c is to: Home              Anticipated d/c date is: 1-2 days.              Patient presenting with a GI bleed, patient with some intermittent complaints of shortness of breath, not stable for discharge.       Consultants:   Gastroenterology: Dr. Silverio Decamp 06/30/2020  Procedures:   Upper endoscopy 07/01/2020 per Dr. Silverio Decamp  Colonoscopy 07/01/2020 per Dr. Silverio Decamp  Transfusion 2 units packed red blood cells 06/30/2020  Antimicrobials:   IV Rocephin 06/30/2020>>>>>   Subjective: Patient sitting up in the chair.  Stated had some shortness of breath early on this morning which has since improved.  Complaining of some nausea and some upper abdominal discomfort.  With brown stool.  No bloody bowel movements noted.   Objective: Vitals:   07/03/20 1245 07/03/20 2139 07/04/20 0531 07/04/20 1350  BP: 135/61 132/63 (!) 147/72 (!) 141/60  Pulse: (!) 57 (!) 56 (!) 56 (!) 55  Resp: 18 16 17 18   Temp: 97.7 F (36.5 C) 98 F (36.7 C) (!) 97.5 F (36.4 C) 97.8 F (36.6 C)  TempSrc: Oral   Oral  SpO2: 93% 93% 95% 97%  Weight:       Height:        Intake/Output Summary (Last 24 hours) at 07/04/2020 1720 Last data filed at 07/04/2020 1400 Gross per 24 hour  Intake 970 ml  Output 1500 ml  Net -530 ml   Filed Weights   06/30/20 2002 07/03/20 0500  Weight: 94.7 kg 98.5 kg    Examination:  General exam: NAD. Respiratory system: Some bibasilar crackles.  No wheezing.  No use of accessory muscles of respiration.  Speaking in full sentences. Cardiovascular system: RRR no murmurs rubs or gallops.  No JVD.  Trace bilateral lower extremity edema.  Gastrointestinal system: Abdomen is soft, nontender, nondistended, positive bowel sounds.  No rebound.  No guarding. Central nervous system: Alert and oriented. No focal neurological deficits. Extremities: Symmetric 5 x 5 power. Skin: No rashes, lesions or ulcers Psychiatry: Judgement and insight appear normal. Mood & affect appropriate.     Data Reviewed: I have personally reviewed following labs and  imaging studies  CBC: Recent Labs  Lab 06/30/20 1150 07/01/20 0032 07/02/20 0529 07/02/20 1433 07/03/20 0520 07/04/20 0448  WBC 5.9 4.8 3.2*  --  3.8* 4.4  NEUTROABS 3.2  --   --   --   --   --   HGB 6.4* 8.5*  8.6* 7.6* 8.4* 8.0* 8.2*  HCT 21.3* 27.7*  27.0* 25.3* 27.2* 26.3* 27.7*  MCV 104.9* 100.0 102.0*  --  102.3* 104.1*  PLT 140* 115* 93*  --  96* 95*    Basic Metabolic Panel: Recent Labs  Lab 06/30/20 1301 07/01/20 0032 07/02/20 0529 07/03/20 0520 07/04/20 0448  NA 140 142 143 141 143  K 4.3 5.0 4.4 4.2 4.3  CL 109 111 112* 110 111  CO2 23 20* 22 21* 20*  GLUCOSE 116* 181* 139* 170* 156*  BUN 49* 44* 32* 25* 21  CREATININE 1.97* 1.93* 1.53* 1.49* 1.47*  CALCIUM 8.8* 8.6* 8.2* 8.2* 8.3*  MG  --   --  2.0  --   --     GFR: Estimated Creatinine Clearance: 38 mL/min (A) (by C-G formula based on SCr of 1.47 mg/dL (H)).  Liver Function Tests: Recent Labs  Lab 06/30/20 1301  AST 27  ALT 17  ALKPHOS 69  BILITOT 1.0  PROT 5.7*  ALBUMIN  2.9*    CBG: Recent Labs  Lab 07/03/20 1632 07/03/20 2138 07/04/20 0751 07/04/20 1143 07/04/20 1633  GLUCAP 146* 145* 143* 169* 121*     Recent Results (from the past 240 hour(s))  Resp Panel by RT-PCR (Flu A&B, Covid) Nasopharyngeal Swab     Status: None   Collection Time: 06/30/20  1:26 PM   Specimen: Nasopharyngeal Swab; Nasopharyngeal(NP) swabs in vial transport medium  Result Value Ref Range Status   SARS Coronavirus 2 by RT PCR NEGATIVE NEGATIVE Final    Comment: (NOTE) SARS-CoV-2 target nucleic acids are NOT DETECTED.  The SARS-CoV-2 RNA is generally detectable in upper respiratory specimens during the acute phase of infection. The lowest concentration of SARS-CoV-2 viral copies this assay can detect is 138 copies/mL. A negative result does not preclude SARS-Cov-2 infection and should not be used as the sole basis for treatment or other patient management decisions. A negative result may occur with  improper specimen collection/handling, submission of specimen other than nasopharyngeal swab, presence of viral mutation(s) within the areas targeted by this assay, and inadequate number of viral copies(<138 copies/mL). A negative result must be combined with clinical observations, patient history, and epidemiological information. The expected result is Negative.  Fact Sheet for Patients:  EntrepreneurPulse.com.au  Fact Sheet for Healthcare Providers:  IncredibleEmployment.be  This test is no t yet approved or cleared by the [place] FDA and  has been authorized for detection and/or diagnosis of SARS-CoV-2 by FDA under an Emergency Use Authorization (EUA). This EUA will remain  in effect (meaning this test can be used) for the duration of the COVID-19 declaration under Section 564(b)(1) of the Act, 21 U.S.C.section 360bbb-3(b)(1), unless the authorization is terminated  or revoked sooner.       Influenza A by PCR NEGATIVE  NEGATIVE Final   Influenza B by PCR NEGATIVE NEGATIVE Final    Comment: (NOTE) The Xpert Xpress SARS-CoV-2/FLU/RSV plus assay is intended as an aid in the diagnosis of influenza from Nasopharyngeal swab specimens and should not be used as a sole basis for treatment. Nasal washings and aspirates are unacceptable for Xpert Xpress SARS-CoV-2/FLU/RSV testing.  Fact Sheet for Patients: EntrepreneurPulse.com.au  Fact Sheet for Healthcare Providers: IncredibleEmployment.be  This test is not yet approved or cleared by the [place] FDA and has been authorized for detection and/or diagnosis of SARS-CoV-2 by FDA under an Emergency Use Authorization (EUA). This EUA will remain in effect (meaning this test can be used) for the duration of the COVID-19 declaration under Section 564(b)(1) of the Act, 21 U.S.C. section 360bbb-3(b)(1), unless the authorization is terminated or revoked.  Performed at [facility], [place] [address], [place], [place] [address]          Radiology Studies: No results found.      Scheduled Meds: . amLODipine  2.5 mg Oral Daily  . feeding supplement  237 mL Oral BID BM  . furosemide  20 mg Intravenous Q12H  . insulin aspart  0-9 Units Subcutaneous TID WC  . levothyroxine  88 mcg Oral Daily  . pantoprazole (PROTONIX) IV  40 mg Intravenous Q12H  . propranolol  40 mg Oral BID  . sucralfate  1 g Oral Q6H   Continuous Infusions: . cefTRIAXone (ROCEPHIN)  IV 2 g (07/03/20 1612)     LOS: 4 days    Time spent: 35 minutes    Irine Seal, MD [facility]   To contact the attending provider between 7A-7P or the covering provider during after hours 7P-7A, please log into the web site [email or web address] and access using universal [place] password for that web site. If you do not have the password, please call the hospital operator.  07/04/2020, 5:20 PM

## 2020-07-04 NOTE — Care Management Important Message (Signed)
Important Message  Patient Details IM Letter given to the Patient. Name: RHYLI DEPAULA MRN: [id number] Date of Birth: 10/18/1940   Medicare Important Message Given:  Yes     Kerin Salen 07/04/2020, 3:22 PM

## 2020-07-05 ENCOUNTER — Telehealth: Payer: Self-pay | Admitting: Internal Medicine

## 2020-07-05 DIAGNOSIS — I824Y9 Acute embolism and thrombosis of unspecified deep veins of unspecified proximal lower extremity: Secondary | ICD-10-CM

## 2020-07-05 DIAGNOSIS — K633 Ulcer of intestine: Secondary | ICD-10-CM

## 2020-07-05 LAB — CBC
HCT: 27.6 % — ABNORMAL LOW (ref 36.0–46.0)
Hemoglobin: 8.5 g/dL — ABNORMAL LOW (ref 12.0–15.0)
MCH: 31.7 pg (ref 26.0–34.0)
MCHC: 30.8 g/dL (ref 30.0–36.0)
MCV: 103 fL — ABNORMAL HIGH (ref 80.0–100.0)
Platelets: 95 10*3/uL — ABNORMAL LOW (ref 150–400)
RBC: 2.68 MIL/uL — ABNORMAL LOW (ref 3.87–5.11)
RDW: 20.1 % — ABNORMAL HIGH (ref 11.5–15.5)
WBC: 4.5 10*3/uL (ref 4.0–10.5)
nRBC: 0 % (ref 0.0–0.2)

## 2020-07-05 LAB — BASIC METABOLIC PANEL
Anion gap: 8 (ref 5–15)
BUN: 18 mg/dL (ref 8–23)
CO2: 22 mmol/L (ref 22–32)
Calcium: 8.5 mg/dL — ABNORMAL LOW (ref 8.9–10.3)
Chloride: 109 mmol/L (ref 98–111)
Creatinine, Ser: 1.51 mg/dL — ABNORMAL HIGH (ref 0.44–1.00)
GFR, Estimated: 35 mL/min — ABNORMAL LOW (ref >60)
Glucose, Bld: 172 mg/dL — ABNORMAL HIGH (ref 70–99)
Potassium: 4.5 mmol/L (ref 3.5–5.1)
Sodium: 139 mmol/L (ref 135–145)

## 2020-07-05 LAB — GLUCOSE, CAPILLARY
Glucose-Capillary: 152 mg/dL — ABNORMAL HIGH (ref 70–99)
Glucose-Capillary: 172 mg/dL — ABNORMAL HIGH (ref 70–99)
Glucose-Capillary: 119 mg/dL — ABNORMAL HIGH (ref 70–99)

## 2020-07-05 MED ORDER — FUROSEMIDE 10 MG/ML IJ SOLN
40.0000 mg | Freq: Two times a day (BID) | INTRAMUSCULAR | Status: DC
  Administered 2020-07-05: 12:00:00 40 mg via INTRAVENOUS
  Filled 2020-07-05: qty 4

## 2020-07-05 MED ORDER — ONDANSETRON HCL 8 MG PO TABS: 8.0000 mg | ORAL_TABLET | Freq: Three times a day (TID) | ORAL | 0 refills | Status: DC | PRN

## 2020-07-05 MED ORDER — TELMISARTAN 40 MG PO TABS: 20.0000 mg | ORAL_TABLET | Freq: Two times a day (BID) | ORAL | Status: DC

## 2020-07-05 MED ORDER — ACCU-CHEK AVIVA DEVI: 0 refills | Status: AC

## 2020-07-05 MED ORDER — PANTOPRAZOLE SODIUM 40 MG PO TBEC: 40.0000 mg | DELAYED_RELEASE_TABLET | Freq: Two times a day (BID) | ORAL | 2 refills | Status: DC

## 2020-07-05 MED ORDER — SUCRALFATE 1 GM/10ML PO SUSP: 1.0000 g | Freq: Four times a day (QID) | ORAL | 0 refills | Status: DC

## 2020-07-05 MED ORDER — RIVAROXABAN 20 MG PO TABS: 20.0000 mg | ORAL_TABLET | Freq: Every day | ORAL | Status: DC

## 2020-07-05 MED ORDER — SIMETHICONE 80 MG PO CHEW: 160.0000 mg | CHEWABLE_TABLET | Freq: Four times a day (QID) | ORAL | 0 refills | Status: DC

## 2020-07-05 NOTE — Progress Notes (Signed)
Physical Therapy Treatment Patient Details Name: Cheryl Jordan MRN: [id number] DOB: [birth year] Today's Date: 07/05/2020    History of Present Illness 79 year old female with a past medical history of colon cancer, RLE DVT on Xarelto, hypothyroidism, cirrhosis, esophageal varices and portal hypertensive gastropathy with multiple recent hospitalizations for GI bleeding and was recently discharged on 06/18/20 for the same who presented to the ED on 06/30/2020 for recurrent rectal bleeding. EGD performed on 07/01/20.    PT Comments    Pt ambulated 350' with a straight cane, no loss of balance. From a PT standpoint, she is safe to DC home. PT goals met, will sign off.   Follow Up Recommendations  No PT follow up     Equipment Recommendations  None recommended by PT    Recommendations for Other Services       Precautions / Restrictions Precautions Precautions: None Precaution Comments: pt denies h/o falls Restrictions Weight Bearing Restrictions: No    Mobility  Bed Mobility Overal bed mobility: Modified Independent             General bed mobility comments: used bedrails  Transfers Overall transfer level: Modified independent Equipment used: None Transfers: Sit to/from Stand Sit to Stand: Independent            Ambulation/Gait Ambulation/Gait assistance: Modified independent (Device/Increase time) Gait Distance (Feet): 350 Feet Assistive device: Straight cane Gait Pattern/deviations: WFL(Within Functional Limits) Gait velocity: WFL   General Gait Details: steady with SPC, no loss of balance   Stairs             Wheelchair Mobility    Modified Rankin (Stroke Patients Only)       Balance Overall balance assessment: Needs assistance Sitting-balance support: Feet supported;No upper extremity supported Sitting balance-Leahy Scale: Good     Standing balance support: No upper extremity supported Standing balance-Leahy Scale: Good                               Cognition Arousal/Alertness: Awake/alert Behavior During Therapy: WFL for tasks assessed/performed Overall Cognitive Status: Within Functional Limits for tasks assessed                                 General Comments: Pleasant and cooperative.      Exercises      General Comments        Pertinent Vitals/Pain Pain Assessment: No/denies pain    Home Living                      Prior Function            PT Goals (current goals can now be found in the care plan section) Acute Rehab PT Goals Patient Stated Goal: to be able to do my chores PT Goal Formulation: With patient Time For Goal Achievement: 07/16/20 Potential to Achieve Goals: Good Progress towards PT goals: Goals met/education completed, patient discharged from PT    Frequency    Min 3X/week      PT Plan Current plan remains appropriate    Co-evaluation              AM-PAC PT "6 Clicks" Mobility   Outcome Measure  Help needed turning from your back to your side while in a flat bed without using bedrails?: None Help needed moving from lying on your back to sitting on  the side of a flat bed without using bedrails?: None Help needed moving to and from a bed to a chair (including a wheelchair)?: None Help needed standing up from a chair using your arms (e.g., wheelchair or bedside chair)?: None Help needed to walk in hospital room?: None Help needed climbing 3-5 steps with a railing? : None 6 Click Score: 24    End of Session Equipment Utilized During Treatment: Gait belt Activity Tolerance: Patient tolerated treatment well Patient left: with call bell/phone within reach;in chair;with nursing/sitter in room Nurse Communication: Mobility status PT Visit Diagnosis: Difficulty in walking, not elsewhere classified (R26.2)     Time: 4656-8127 PT Time Calculation (min) (ACUTE ONLY): 10 min  Charges:  $Gait Training: 8-22 mins                      Blondell Reveal Kistler PT 07/05/2020  Acute Rehabilitation Services Pager [phone] Office [phone]

## 2020-07-05 NOTE — Telephone Encounter (Signed)
Inbound call from patient's son requesting a call back.  Patient was admitted on 07/01/20 and had some procedures done and has questions about it.

## 2020-07-05 NOTE — Discharge Summary (Signed)
Physician Discharge Summary  Cheryl Jordan:[id number] DOB: 17-Jan-1941 DOA: 06/30/2020  PCP: Brunetta Jeans, PA-C  Admit date: 06/30/2020 Discharge date: 07/05/2020  Time spent: 60 minutes  Recommendations for Outpatient Follow-up:  1. Follow-up with Dr. Henrene Pastor, gastroenterology 07/25/2020 as previously scheduled. 2. Follow-up with Brunetta Jeans, PA-C in 1 to 2 weeks.  On follow-up patient will need a CBC done to follow-up on H&H.  Patient also need a basic metabolic profile done to follow-up on electrolytes and renal function.   Discharge Diagnoses:  Principal Problem:   GI bleed Active Problems:   Hypothyroidism   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[place])   Hypertension associated with diabetes ([place])   SOB (shortness of breath)   Symptomatic anemia   Acute blood loss anemia   Colon ulcer   Deep venous thrombosis ([facility])   Volume overload   Other cirrhosis of liver ([place])   Discharge Condition: Stable and improved.  Diet recommendation: Heart healthy  Filed Weights   06/30/20 2002 07/03/20 0500  Weight: 94.7 kg 98.5 kg    History of present illness:  HPI per Dr. Neysa Bonito This is a 79 year old female with a past medical history of colon cancer, RLE DVT on Xarelto, hypothyroid cirrhosis, esophageal varices and portal hypertensive gastropathy with multiple recent hospitalizations for GI bleeding and was recently discharged on 12/4 for the same who presented to the ED today for recurrent rectal bleeding starting yesterday.  Patient was seen by her oncologist, Dr. Benay Spice, today for a 52-month follow-up who ordered an FOBT and CBC in response to these symptoms and she was found to have an Hb 6.4, Hb 9.6 on 12/9.  FOBT was positive.  Currently states that she has had some lightheadedness when standing but has not had any falls or loss of consciousness.  Reported bright red blood per rectum yesterday and this morning.  Last took her Xarelto last night.  Complains of some  intermittent epigastric pain   ED Course: Afebrile, hemodynamically stable, on room air. Notable Labs: CMP pending, WBC 5.9, Hb 6.4, MCV 105, platelets 140, INR 2.1.  GI was consulted by the ED.  Patient received Protonix bolus and infusion and 1 units PRBCs ordered.  Hospital Course:  1 recurrent GI bleed/acute on chronic blood loss anemia/symptomatic anemia Patient presented with rectal bleeding, noted to have a hemoglobin of 6.4 on admission.  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 Hemoglobin stabilized at 8.5 by day of discharge from 8.2 from 8.0 from 7.6 from 8.5 from 8.6.  Status post transfusion 2 units packed red blood cells 06/30/2020. Patient underwent upper endoscopy 07/01/2020 that showed portal hypertensive gastropathy, clotted blood in gastric antrum and prepyloric region of the stomach, GAVE with bleeding status post APC and clips.  Patient underwent colonoscopy 07/01/2020 which showed scar at colonic anastomosis status post clip placement, post polypectomy scar and rectum status post clip placed.  Was on Protonix drip and subsequently transitioned to IV PPI twice daily and will be discharged home on oral Protonix 40 mg twice daily per GI recommendations.  Patient status post octreotide and IV Rocephin during the hospitalization.  Patient placed on Carafate as recommended per GI on endoscopy report.    GI recommended Xarelto may be resumed 7 days post endoscopy on 07/08/2020.  Outpatient follow-up  with GI as previously scheduled on July 25, 2020.   2.  Right lower extremity DVT Diagnosed 05/10/2020.    Patient was on Xarelto prior to admission which was held secondary to problem #1.  GI recommended Xarelto may be resumed on  07/08/2020.  Per GI if patient with continued bleeding on Xarelto may require IVC filter placement.  Outpatient follow-up with PCP.   3.  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  Status post octreotide.    Patient also placed on IV Rocephin for SBP prophylaxis was patient had presented with GI bleed and history of hypertensive gastropathy.  Patient remained stable.  During hospitalization patient had some shortness of breath and received some IV diuretics with good urine output and clinical improvement.  Spironolactone was subsequently resumed.  Patient be discharged back home on home regimen of spironolactone and Lasix.  Outpatient follow-up with GI.   4.  Acute kidney injury on chronic kidney disease stage IIIb Likely secondary to problem #1.  Creatinine on 06/23/2020 was 1.25.  On presentation creatinine 1.97.  Creatinine trended down during the hospitalization.  Patient's diuretics and ARB was held initially during the hospitalization.  Patient noted to have some component of volume overload and diuresed with IV Lasix.  Spironolactone was subsequently resumed.  Patient renal function improved and creatinine was down to 1.51 by day of discharge.  Patient's my card is to be resumed 1 week post discharge.  Outpatient follow-up with PCP.   5.  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  Patient with no overt bleeding.  Platelet count noted to be at 95 by day of discharge.  Outpatient follow-up.    6.  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 Benay Spice.  7.  2.8 x 2.5 cm complex hypodense abnormality right hepatic lobe concerning for [facility] Per GI.  8.  Hypothyroidism Patient maintained on home regimen Synthroid.   9.  Well-controlled diabetes mellitus type 2 Hemoglobin A1c  6.2 (04/29/2020).    Oral hypoglycemic agents were held during the hospitalization and patient maintained on sliding scale insulin.   10.  Hypertension Patient maintained on home regimen Norvasc for  11.  Intermittent dyspnea/volume overload Patient with some complaints of intermittent dyspnea.  Patient noted to be +3 L during this hospitalization.  Last weight noted in computer of 217.15 pounds (07/03/2020) from 208.78 pounds on admission.  Patient noted to have some bibasilar crackles on examination had received transfusion of packed red blood cells.  Patient placed on IV Lasix with good urine output and clinical improvement.  Patient also started back on home regimen of spironolactone.  Patient was discharged home back on home regimen of oral Lasix and spironolactone.  Outpatient follow-up with PCP.     Procedures:  Upper endoscopy 07/01/2020 per Dr. Silverio Decamp  Colonoscopy 07/01/2020 per Dr. Silverio Decamp  Transfusion 2 units packed red blood cells 06/30/2020   Consultations:  Gastroenterology: Dr. Silverio Decamp 06/30/2020    Discharge Exam: Vitals:   07/05/20 1050 07/05/20 1341  BP: (!) 153/72 130/60  Pulse: (!) 57 (!) 53  Resp:  18  Temp:  97.8 F (36.6 C)  SpO2:  95%    General: NAD Cardiovascular: RRR Respiratory: CTAB  Discharge Instructions   Discharge Instructions    Diet - low sodium heart healthy   Complete by: As directed    Increase activity slowly  Complete by: As directed      Allergies as of 07/05/2020      Reactions   Aspirin Other (See Comments)   Runny nose    Codeine Nausea And Vomiting      Medication List    STOP taking these medications   omeprazole 20 MG capsule Commonly known as: PriLOSEC   sucralfate 1 g tablet Commonly known as: CARAFATE Replaced by: sucralfate 1 GM/10ML suspension     TAKE these medications   Accu-Chek Aviva device Use as instructed   Accu-Chek Aviva Plus test strip Generic drug: glucose blood    acetaminophen 500 MG tablet Commonly known as: TYLENOL Take 500 mg by mouth every 6 (six) hours as needed for moderate pain.   amLODipine 2.5 MG tablet Commonly known as: NORVASC Take 1 tablet (2.5 mg total) by mouth daily.   ferrous sulfate 325 (65 FE) MG EC tablet Take 1 tablet (325 mg total) by mouth 2 (two) times daily with a meal. What changed: when to take this   furosemide 40 MG tablet Commonly known as: Lasix Take 1 tablet (40 mg total) by mouth daily.   glipiZIDE 5 MG 24 hr tablet Commonly known as: GLUCOTROL XL TAKE (1) TABLET DAILY WITH BREAKFAST. What changed: See the new instructions.   hydrocortisone 25 MG suppository Commonly known as: ANUSOL-HC Place 1 suppository (25 mg total) rectally at bedtime for 7 days, THEN 1 suppository (25 mg total) every other day for 14 days. Start taking on: June 18, 2020 What changed: See the new instructions.   hydrocortisone cream 1 % Apply topically 2 (two) times daily. What changed: how much to take   levothyroxine 88 MCG tablet Commonly known as: SYNTHROID Take 1 tablet (88 mcg total) by mouth daily.   loperamide 2 MG capsule Commonly known as: IMODIUM Take 2 capsules by mouth once. Then take additional 2mg  capsule as needed for continued diarrhea. No more than 16 mg in 24 hours.   LORazepam 0.5 MG tablet Commonly known as: ATIVAN Take 1 tablet (0.5 mg total) by mouth 2 (two) times daily as needed for anxiety.   ondansetron 8 MG tablet Commonly known as: ZOFRAN Take 1 tablet (8 mg total) by mouth every 8 (eight) hours as needed for nausea or vomiting.   pantoprazole 40 MG tablet Commonly known as: Protonix Take 1 tablet (40 mg total) by mouth 2 (two) times daily before a meal.   polycarbophil 625 MG tablet Commonly known as: FIBERCON Take 1 tablet (625 mg total) by mouth daily.   propranolol 40 MG tablet Commonly known as: INDERAL Take 1 tablet (40 mg total) by mouth 2 (two) times daily.   rivaroxaban  20 MG Tabs tablet Commonly known as: XARELTO Take 1 tablet (20 mg total) by mouth daily with supper. Start taking on: July 08, 2020 What changed:   additional instructions  These instructions start on July 08, 2020. If you are unsure what to do until then, ask your doctor or other care provider.   simethicone 80 MG chewable tablet Commonly known as: Gas-X Chew 2 tablets (160 mg total) by mouth 4 (four) times daily for 5 days.   spironolactone 50 MG tablet Commonly known as: Aldactone Take 1 tablet (50 mg total) by mouth daily.   sucralfate 1 GM/10ML suspension Commonly known as: CARAFATE Take 10 mLs (1 g total) by mouth 4 (four) times daily. Replaces: sucralfate 1 g tablet   telmisartan 40 MG tablet Commonly known as: MICARDIS Take  0.5 tablets (20 mg total) by mouth in the morning and at bedtime. Start taking on: July 12, 2020 What changed: These instructions start on July 12, 2020. If you are unsure what to do until then, ask your doctor or other care provider.      Allergies  Allergen Reactions  . Aspirin Other (See Comments)    Runny nose   . Codeine Nausea And Vomiting    Follow-up Information    Brunetta Jeans, PA-C. Schedule an appointment as soon as possible for a visit in 2 week(s).   Specialty: Family Medicine Why: f/u in 1-2 weeks. Contact information: [address] [place] [address] [phone]        [name], MD Follow up on 07/25/2020.   Specialty: Gastroenterology Why: f/u as scheduled Contact information: [address] [place] [address] [phone]                The results of significant diagnostics from this hospitalization (including imaging, microbiology, ancillary and laboratory) are listed below for reference.    Significant Diagnostic Studies: No results found.  Microbiology: Recent Results (from the past 240 hour(s))  Resp Panel by RT-PCR (Flu A&B, Covid) Nasopharyngeal Swab      Status: None   Collection Time: 06/30/20  1:26 PM   Specimen: Nasopharyngeal Swab; Nasopharyngeal(NP) swabs in vial transport medium  Result Value Ref Range Status   SARS Coronavirus 2 by RT PCR NEGATIVE NEGATIVE Final    Comment: (NOTE) SARS-CoV-2 target nucleic acids are NOT DETECTED.  The SARS-CoV-2 RNA is generally detectable in upper respiratory specimens during the acute phase of infection. The lowest concentration of SARS-CoV-2 viral copies this assay can detect is 138 copies/mL. A negative result does not preclude SARS-Cov-2 infection and should not be used as the sole basis for treatment or other patient management decisions. A negative result may occur with  improper specimen collection/handling, submission of specimen other than nasopharyngeal swab, presence of viral mutation(s) within the areas targeted by this assay, and inadequate number of viral copies(<138 copies/mL). A negative result must be combined with clinical observations, patient history, and epidemiological information. The expected result is Negative.  Fact Sheet for Patients:  EntrepreneurPulse.com.au  Fact Sheet for Healthcare Providers:  IncredibleEmployment.be  This test is no t yet approved or cleared by the [place] FDA and  has been authorized for detection and/or diagnosis of SARS-CoV-2 by FDA under an Emergency Use Authorization (EUA). This EUA will remain  in effect (meaning this test can be used) for the duration of the COVID-19 declaration under Section 564(b)(1) of the Act, 21 U.S.C.section 360bbb-3(b)(1), unless the authorization is terminated  or revoked sooner.       Influenza A by PCR NEGATIVE NEGATIVE Final   Influenza B by PCR NEGATIVE NEGATIVE Final    Comment: (NOTE) The Xpert Xpress SARS-CoV-2/FLU/RSV plus assay is intended as an aid in the diagnosis of influenza from Nasopharyngeal swab specimens and should not be used as a sole basis  for treatment. Nasal washings and aspirates are unacceptable for Xpert Xpress SARS-CoV-2/FLU/RSV testing.  Fact Sheet for Patients: EntrepreneurPulse.com.au  Fact Sheet for Healthcare Providers: IncredibleEmployment.be  This test is not yet approved or cleared by the [place] FDA and has been authorized for detection and/or diagnosis of SARS-CoV-2 by FDA under an Emergency Use Authorization (EUA). This EUA will remain in effect (meaning this test can be used) for the duration of the COVID-19 declaration under Section  564(b)(1) of the Act, 21 U.S.C. section 360bbb-3(b)(1), unless the authorization is terminated or revoked.  Performed at [facility], [place] [address], [place], [place] [address]      Labs: Basic Metabolic Panel: Recent Labs  Lab 07/01/20 0032 07/02/20 0529 07/03/20 0520 07/04/20 0448 07/05/20 0451  NA 142 143 141 143 139  K 5.0 4.4 4.2 4.3 4.5  CL 111 112* 110 111 109  CO2 20* 22 21* 20* 22  GLUCOSE 181* 139* 170* 156* 172*  BUN 44* 32* 25* 21 18  CREATININE 1.93* 1.53* 1.49* 1.47* 1.51*  CALCIUM 8.6* 8.2* 8.2* 8.3* 8.5*  MG  --  2.0  --   --   --    Liver Function Tests: Recent Labs  Lab 06/30/20 1301  AST 27  ALT 17  ALKPHOS 69  BILITOT 1.0  PROT 5.7*  ALBUMIN 2.9*   No results for input(s): LIPASE, AMYLASE in the last 168 hours. No results for input(s): AMMONIA in the last 168 hours. CBC: Recent Labs  Lab 06/30/20 1150 06/30/20 1150 07/01/20 0032 07/02/20 0529 07/02/20 1433 07/03/20 0520 07/04/20 0448 07/05/20 0451  WBC 5.9  --  4.8 3.2*  --  3.8* 4.4 4.5  NEUTROABS 3.2  --   --   --   --   --   --   --   HGB 6.4*   < > 8.5*  8.6* 7.6* 8.4* 8.0* 8.2* 8.5*  HCT 21.3*  --  27.7*  27.0* 25.3* 27.2* 26.3* 27.7* 27.6*  MCV 104.9*  --  100.0 102.0*  --  102.3* 104.1* 103.0*  PLT 140*  --  115* 93*  --  96* 95* 95*   < > = values in this interval not displayed.   Cardiac  Enzymes: No results for input(s): CKTOTAL, CKMB, CKMBINDEX, TROPONINI in the last 168 hours. BNP: BNP (last 3 results) No results for input(s): BNP in the last 8760 hours.  ProBNP (last 3 results) Recent Labs    03/30/20 1507  PROBNP 250    CBG: Recent Labs  Lab 07/04/20 1143 07/04/20 1633 07/05/20 0801 07/05/20 1155 07/05/20 1652  GLUCAP 169* 121* 152* 172* 119*       Signed:  Irine Seal MD.  [facility] 07/05/2020, 5:43 PM

## 2020-07-05 NOTE — Telephone Encounter (Signed)
Spoke with pts son and he is aware. He hopes his mother will get to go home later today.

## 2020-07-05 NOTE — Consult Note (Signed)
Palliative care consult note  Reason for consult: Goals of care in light of cirrhosis with recurrent GI bleeding  Palliative care consult received.  Chart reviewed including personal review of pertinent labs and imaging.  Briefly, Cheryl Jordan is a 79 year old female with past medical history of colon cancer, right lower extremity DVT on Xarelto, hypothyroidism, cirrhosis, esophageal varices and portal hypertensive gastropathy has had recurrent hospitalizations for GI bleeding.  She was most recently discharged on 12/4.  She then saw Dr. Benay Spice on 12/16 and was found to have a hemoglobin of 6.4 and sent to the ED where she was started IV Protonix and octreotide.  GI was consulted.  She had endoscopic evaluation with gastric AVMs treated with APC and clip placement.  Palliative consulted for goals of care.  I met today with Cheryl Jordan and her son, Cheryl Jordan.  I introduced palliative care as specialized medical care for people living with serious illness. It focuses on providing relief from the symptoms and stress of a serious illness. The goal is to improve quality of life for both the patient and the family.  We discussed clinical course as well as wishes moving forward in regard to advanced directives.  Concepts specific to code status, surrogate decision making, and rehospitalization discussed.  Values and goals of care important to patient and family were attempted to be elicited.  We reviewed a MOST form and discussed how to develop plan of care to focus on continuing therapies that would maximize chance of being well enough to return home and limiting therapies not in line with this goal.  -Limited code -She would like to continue with current interventions and a plan to follow-up with her outpatient providers to further discuss long-term goals of care. -Discussed surrogate decision making.  She reports that her sons will work in conjunction with husband to make decisions on her behalf.  She does not  feel the need to complete [facility] POA paperwork at this time. -Discussed MOST form and recommended consideration for completion.  She would like to take at home and review with her family prior to completion.  I let her know that if she has a chance to review them would like to complete a MOST form prior to discharge, she could call and somebody from the palliative medicine team be happy to come back and discuss further with her.  At this point, however, she does not desire further follow-up from our team.  Questions and concerns addressed.   PMT will continue to support holistically.  Total time: 45 minutes Greater than 50%  of this time was spent counseling and coordinating care related to the above assessment and plan.  Micheline Rough, MD [facility] [phone]

## 2020-07-05 NOTE — Telephone Encounter (Signed)
1.  Issues with anxiety and panic attack are under the purview of the hospitalist when she is inpatient or her PCP when she is outpatient 2.  Zofran as needed for nausea.  She had this on her MAR as an inpatient.  Nausea is a very common and nonspecific side effect when you have been sick.  It may take time for this to improve. 3.  I was only at the hospital helping out yesterday.  I will not be over at the hospital this week.  If the primary service needs additional GI input (I doubt they do) they are available.

## 2020-07-05 NOTE — Telephone Encounter (Signed)
Pts son called and states the pt is in the hospital and that Dr. Henrene Pastor came to see her yesterday. He wanted to let Dr. Henrene Pastor know that every morning she has been having some nausea and then a panic attack. He states she really wants to go home but wanted to know if Dr. Henrene Pastor has any recommendations regarding these issues. Please advise.

## 2020-07-06 ENCOUNTER — Telehealth: Payer: Self-pay

## 2020-07-06 NOTE — Telephone Encounter (Signed)
1st attempt TCM call. No answer. 

## 2020-07-07 ENCOUNTER — Telehealth: Payer: Self-pay

## 2020-07-07 NOTE — Telephone Encounter (Signed)
Transition Care Management Unsuccessful Follow-up Telephone Call  Date of discharge and from where:  07/05/2020-[facility]  Attempts:  3rd Attempt  Reason for unsuccessful TCM follow-up call:  No answer/busy

## 2020-07-18 ENCOUNTER — Other Ambulatory Visit: Payer: Self-pay | Admitting: Physician Assistant

## 2020-07-21 ENCOUNTER — Other Ambulatory Visit: Payer: Self-pay

## 2020-07-21 ENCOUNTER — Other Ambulatory Visit: Payer: Self-pay | Admitting: Physician Assistant

## 2020-07-21 ENCOUNTER — Other Ambulatory Visit (INDEPENDENT_AMBULATORY_CARE_PROVIDER_SITE_OTHER): Payer: Medicare Other

## 2020-07-21 DIAGNOSIS — K7460 Unspecified cirrhosis of liver: Secondary | ICD-10-CM | POA: Diagnosis not present

## 2020-07-21 DIAGNOSIS — D509 Iron deficiency anemia, unspecified: Secondary | ICD-10-CM

## 2020-07-21 DIAGNOSIS — E785 Hyperlipidemia, unspecified: Secondary | ICD-10-CM

## 2020-07-21 DIAGNOSIS — E1169 Type 2 diabetes mellitus with other specified complication: Secondary | ICD-10-CM

## 2020-07-21 DIAGNOSIS — R188 Other ascites: Secondary | ICD-10-CM | POA: Diagnosis not present

## 2020-07-21 LAB — COMPREHENSIVE METABOLIC PANEL
ALT: 11 U/L (ref 0–35)
AST: 19 U/L (ref 0–37)
Albumin: 3.3 g/dL — ABNORMAL LOW (ref 3.5–5.2)
Alkaline Phosphatase: 90 U/L (ref 39–117)
BUN: 22 mg/dL (ref 6–23)
CO2: 27 mEq/L (ref 19–32)
Calcium: 8.6 mg/dL (ref 8.4–10.5)
Chloride: 108 mEq/L (ref 96–112)
Creatinine, Ser: 1.7 mg/dL — ABNORMAL HIGH (ref 0.40–1.20)
GFR: 28.55 mL/min — ABNORMAL LOW (ref >60.00)
Glucose, Bld: 140 mg/dL — ABNORMAL HIGH (ref 70–99)
Potassium: 5.3 mEq/L — ABNORMAL HIGH (ref 3.5–5.1)
Sodium: 141 mEq/L (ref 135–145)
Total Bilirubin: 0.6 mg/dL (ref 0.2–1.2)
Total Protein: 5.8 g/dL — ABNORMAL LOW (ref 6.0–8.3)

## 2020-07-21 LAB — CBC WITH DIFFERENTIAL/PLATELET
Basophils Absolute: 0 10*3/uL (ref 0.0–0.1)
Basophils Relative: 0.8 % (ref 0.0–3.0)
Eosinophils Absolute: 0.1 10*3/uL (ref 0.0–0.7)
Eosinophils Relative: 1.6 % (ref 0.0–5.0)
HCT: 22.2 % — CL (ref 36.0–46.0)
Hemoglobin: 7.2 g/dL — CL (ref 12.0–15.0)
Lymphocytes Relative: 34.7 % (ref 12.0–46.0)
Lymphs Abs: 1.8 10*3/uL (ref 0.7–4.0)
MCHC: 32.3 g/dL (ref 30.0–36.0)
MCV: 93.1 fl (ref 78.0–100.0)
Monocytes Absolute: 0.3 10*3/uL (ref 0.1–1.0)
Monocytes Relative: 6.6 % (ref 3.0–12.0)
Neutro Abs: 2.9 10*3/uL (ref 1.4–7.7)
Neutrophils Relative %: 56.3 % (ref 43.0–77.0)
Platelets: 147 10*3/uL — ABNORMAL LOW (ref 150.0–400.0)
RBC: 2.38 Mil/uL — ABNORMAL LOW (ref 3.87–5.11)
RDW: 17.8 % — ABNORMAL HIGH (ref 11.5–15.5)
WBC: 5.2 10*3/uL (ref 4.0–10.5)

## 2020-07-21 LAB — IRON: Iron: 26 ug/dL — ABNORMAL LOW (ref 42–145)

## 2020-07-21 LAB — FERRITIN: Ferritin: 13.5 ng/mL (ref 10.0–291.0)

## 2020-07-22 ENCOUNTER — Telehealth: Payer: Self-pay | Admitting: *Deleted

## 2020-07-22 DIAGNOSIS — C189 Malignant neoplasm of colon, unspecified: Secondary | ICD-10-CM

## 2020-07-22 DIAGNOSIS — D649 Anemia, unspecified: Secondary | ICD-10-CM

## 2020-07-22 LAB — IRON, TOTAL/TOTAL IRON BINDING CAP
%SAT: 6 % (calc) — ABNORMAL LOW (ref 16–45)
Iron: 21 ug/dL — ABNORMAL LOW (ref 45–160)
TIBC: 336 mcg/dL (calc) (ref 250–450)

## 2020-07-22 NOTE — Telephone Encounter (Signed)
Per Dr. Benay Spice: Needs OV 1/11 at 1030/1100 per referral back from Dr. Henrene Pastor for anemia and IDA. Unable to reach patient, but provided her son the appointment. He will call her with appointment date/time.

## 2020-07-25 ENCOUNTER — Ambulatory Visit: Payer: Medicare Other | Admitting: Internal Medicine

## 2020-07-25 ENCOUNTER — Other Ambulatory Visit (INDEPENDENT_AMBULATORY_CARE_PROVIDER_SITE_OTHER): Payer: Medicare Other

## 2020-07-25 ENCOUNTER — Encounter: Payer: Self-pay | Admitting: Internal Medicine

## 2020-07-25 VITALS — BP 144/60 | HR 76 | Ht 67.0 in | Wt 213.2 lb

## 2020-07-25 DIAGNOSIS — Z85038 Personal history of other malignant neoplasm of large intestine: Secondary | ICD-10-CM | POA: Diagnosis not present

## 2020-07-25 DIAGNOSIS — D509 Iron deficiency anemia, unspecified: Secondary | ICD-10-CM

## 2020-07-25 DIAGNOSIS — K7460 Unspecified cirrhosis of liver: Secondary | ICD-10-CM | POA: Diagnosis not present

## 2020-07-25 DIAGNOSIS — R188 Other ascites: Secondary | ICD-10-CM

## 2020-07-25 DIAGNOSIS — Z7901 Long term (current) use of anticoagulants: Secondary | ICD-10-CM

## 2020-07-25 LAB — BASIC METABOLIC PANEL
BUN: 16 mg/dL (ref 6–23)
CO2: 26 mEq/L (ref 19–32)
Calcium: 8.6 mg/dL (ref 8.4–10.5)
Chloride: 107 mEq/L (ref 96–112)
Creatinine, Ser: 1.42 mg/dL — ABNORMAL HIGH (ref 0.40–1.20)
GFR: 35.43 mL/min — ABNORMAL LOW (ref >60.00)
Glucose, Bld: 144 mg/dL — ABNORMAL HIGH (ref 70–99)
Potassium: 4.8 mEq/L (ref 3.5–5.1)
Sodium: 138 mEq/L (ref 135–145)

## 2020-07-25 LAB — CBC WITH DIFFERENTIAL/PLATELET
Basophils Absolute: 0 10*3/uL (ref 0.0–0.1)
Basophils Relative: 0.7 % (ref 0.0–3.0)
Eosinophils Absolute: 0.1 10*3/uL (ref 0.0–0.7)
Eosinophils Relative: 1.3 % (ref 0.0–5.0)
HCT: 22.2 % — CL (ref 36.0–46.0)
Hemoglobin: 7.2 g/dL — CL (ref 12.0–15.0)
Lymphocytes Relative: 34.3 % (ref 12.0–46.0)
Lymphs Abs: 1.7 10*3/uL (ref 0.7–4.0)
MCHC: 32.3 g/dL (ref 30.0–36.0)
MCV: 91.1 fl (ref 78.0–100.0)
Monocytes Absolute: 0.3 10*3/uL (ref 0.1–1.0)
Monocytes Relative: 5.7 % (ref 3.0–12.0)
Neutro Abs: 2.8 10*3/uL (ref 1.4–7.7)
Neutrophils Relative %: 58 % (ref 43.0–77.0)
Platelets: 155 10*3/uL (ref 150.0–400.0)
RBC: 2.44 Mil/uL — ABNORMAL LOW (ref 3.87–5.11)
RDW: 18.4 % — ABNORMAL HIGH (ref 11.5–15.5)
WBC: 4.9 10*3/uL (ref 4.0–10.5)

## 2020-07-25 NOTE — Progress Notes (Signed)
HISTORY OF PRESENT ILLNESS:  Cheryl Jordan is a 80 y.o. female with multiple significant medical problems including hepatic cirrhosis secondary to [place].  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  Other issues included DVT on Xarelto which has exacerbated bleeding.  Chronic multifactorial anemia including renal insufficiency.  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  Prior history of colon cancer.  Hospitalization for decompensated cirrhosis and GI bleeding, recently.  Chronic postprandial diarrhea.  Other general medical problems as listed.  Last seen in the office November 2021.  2 hospitalizations since.  She did eventually have both colonoscopy and upper endoscopy (on 2 occasions).  Last procedure was July 01, 2020.  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  She was discharged home on diuretics, iron therapy, PPI, and Xarelto in addition to her other general medicine medications.  This appointment was scheduled routinely.  She did undergo blood work 4 days ago.  Highlights from that blood work for potassium 5.3, GFR 28, hemoglobin 7.2 (previously low 8 range), and ferritin 13.5.  She was told to hold her Aldactone.  Patient tells me that she has been stable since her hospital discharge but in general does not feel well.  Fatigue.  No acute complaints.  No GI bleeding.  Repeat hemoglobin today was found to be stable at 7.2.  She is followed by hematology and does have follow-up in the near future.  She may require occasional transfusion (hopefully on an outpatient basis).  She does complain of slight bloating but does not feel that she has significant recurrent ascites.  She has completed her COVID vaccination series  REVIEW OF SYSTEMS:  All non-GI ROS negative unless otherwise stated in the HPI except for anxiety, visual change, ankle swelling, shortness of breath with exertion  Past Medical  History:  Diagnosis Date  . Allergy   . Anemia    hx of  . Anxiety   . Arthritis   . Colon cancer ([place]) dx'd 11/2014  . Diabetes mellitus without complication ([place])    TYPE II  . Fatty liver   . Gallbladder disease   . GERD (gastroesophageal reflux disease)   . History of gout   . Hypertension   . Hypothyroidism   . Kidney disorder    spot on left kidney  . Osteoporosis   . PONV (postoperative nausea and vomiting)     Past Surgical History:  Procedure Laterality Date  . BIOPSY  06/17/2020   Procedure: BIOPSY;  Surgeon: Rush Landmark Telford Nab., MD;  Location: [facility];  Service: Gastroenterology;;  . CHOLECYSTECTOMY    . COLONOSCOPY WITH PROPOFOL N/A 06/17/2020   Procedure: COLONOSCOPY WITH PROPOFOL;  Surgeon: Rush Landmark Telford Nab., MD;  Location: [facility];  Service: Gastroenterology;  Laterality: N/A;  . COLONOSCOPY WITH PROPOFOL N/A 07/01/2020   Procedure: COLONOSCOPY WITH PROPOFOL;  Surgeon: Mauri Pole, MD;  Location: [facility];  Service: Endoscopy;  Laterality: N/A;  . ESOPHAGOGASTRODUODENOSCOPY (EGD) WITH PROPOFOL N/A 06/17/2020   Procedure: ESOPHAGOGASTRODUODENOSCOPY (EGD) WITH PROPOFOL;  Surgeon: Rush Landmark Telford Nab., MD;  Location: [facility];  Service: Gastroenterology;  Laterality: N/A;  . ESOPHAGOGASTRODUODENOSCOPY (EGD) WITH PROPOFOL N/A 07/01/2020   Procedure: ESOPHAGOGASTRODUODENOSCOPY (EGD) WITH PROPOFOL;  Surgeon: Mauri Pole, MD;  Location: [facility];  Service: Endoscopy;  Laterality: N/A;  . FOREIGN BODY REMOVAL  06/17/2020   Procedure: FOREIGN BODY REMOVAL;  Surgeon: Rush Landmark Telford Nab., MD;  Location: [facility];  Service: Gastroenterology;;  .  GI RADIOFREQUENCY ABLATION  06/17/2020   Procedure: GI RADIOFREQUENCY ABLATION;  Surgeon: Rush Landmark Telford Nab., MD;  Location: [facility];  Service: Gastroenterology;;  . HEMOSTASIS CLIP PLACEMENT  06/17/2020   Procedure: HEMOSTASIS CLIP PLACEMENT;  Surgeon: Irving Copas., MD;  Location: [facility];  Service: Gastroenterology;;  . HEMOSTASIS CLIP PLACEMENT  07/01/2020   Procedure: HEMOSTASIS CLIP PLACEMENT;  Surgeon: Mauri Pole, MD;  Location: [facility];  Service: Endoscopy;;  . HOT HEMOSTASIS N/A 07/01/2020   Procedure: HOT HEMOSTASIS (ARGON PLASMA COAGULATION/BICAP);  Surgeon: Mauri Pole, MD;  Location: [facility];  Service: Endoscopy;  Laterality: N/A;  . IR RADIOLOGIST EVAL & MGMT  01/24/2017  . KNEE CARTILAGE SURGERY Bilateral   . NEPHRECTOMY Left 02/10/2015   Procedure: OPEN RETROPERINTONEAL EXPLORATION LEFT RENAL  CYST DECORTICATION X 5;  Surgeon: Cleon Gustin, MD;  Location: [facility];  Service: Urology;  Laterality: Left;  . PARTIAL COLECTOMY N/A 02/10/2015   Procedure: OPEN RIGHT  COLECTOMY ;  Surgeon: [name], MD;  Location: [facility];  Service: General;  Laterality: N/A;  . POLYPECTOMY  06/17/2020   Procedure: POLYPECTOMY;  Surgeon: Rush Landmark Telford Nab., MD;  Location: [facility];  Service: Gastroenterology;;  . TUBAL LIGATION      Social History [name]  reports that she has never smoked. She has never used smokeless tobacco. She reports that she does not drink alcohol and does not use drugs.  family history includes COPD in her brother, brother, and father; Diabetes in her mother; GER disease in her sister; Heart attack in her brother; Hypertension in her brother, sister, and another family member; Lung cancer in her brother.  Allergies  Allergen Reactions  . Aspirin Other (See Comments)    Runny nose   . Codeine Nausea And Vomiting       PHYSICAL EXAMINATION: Vital signs: BP (!) 144/60 (BP Location: Left Arm, Patient Position: Sitting, Cuff Size: Normal)   Pulse 76   Ht 5\' 7"  (1.702 m)   Wt 213 lb 4 oz (96.7 kg)   BMI 33.40 kg/m   Constitutional: Pleasant, chronically ill and tired appearing elderly female, no acute distress.  Obese Psychiatric: alert and oriented x3, cooperative Eyes:  extraocular movements intact, anicteric, conjunctiva pale Mouth: Mask Neck: supple no lymphadenopathy Cardiovascular: heart regular rate and rhythm. Lungs: clear to auscultation bilaterally Abdomen: soft, obese, nontender, nondistended, no obvious ascites, no peritoneal signs, normal bowel sounds, no organomegaly.  Abdominal scars well-healed Rectal: Omitted Extremities: no clubbing or cyanosis.  1+ lower extremity edema bilaterally Skin: Pale.  No lesions on visible extremities Neuro: No focal deficits. No asterixis.    ASSESSMENT:  1.  NASH cirrhosis with portal hypertension complicated by ascites, portal gastropathy with bleeding, and possible HCC (2.8 x 2.5 cm right liver lesion). 2.  Multifactorial anemia.  Hemoglobin 7.2.  Stable 3.  DVT on Xarelto for about 3 months 4.  Repeat colonoscopies and upper endoscopies within the past month as described 5.  Hyperkalemia.  Aldactone held.  Repeat potassium today is normal. 6.  History of colon cancer. 7.  Chronic nausea.  Controlled with Zofran  PLAN:  1.  Repeat CBC and basic metabolic panel today 2.  Continue GI medications without change 3.  Patient has hematology/oncology appointment tomorrow.  I am hoping that her blood counts can be monitored closely and transfusions provided, as needed, to avoid hospitalization (of course, for acute or subacute bleeding, hospital admission would be most appropriate).  As well,  hopeful that she will be able to come off her anticoagulation therapy after reasonable period of time since her diagnosis of DVT without PE. 4.  Continue Zofran as needed.  Prescribed 5.  Routine GI follow-up 2 months

## 2020-07-25 NOTE — Patient Instructions (Signed)
Take the probiotic "envive" once a day for a month.  Please follow up on _______________________

## 2020-07-26 ENCOUNTER — Inpatient Hospital Stay: Payer: Medicare Other | Admitting: Oncology

## 2020-07-26 ENCOUNTER — Other Ambulatory Visit: Payer: Self-pay

## 2020-07-26 ENCOUNTER — Inpatient Hospital Stay: Payer: Medicare Other | Attending: Oncology

## 2020-07-26 ENCOUNTER — Inpatient Hospital Stay: Payer: Medicare Other

## 2020-07-26 ENCOUNTER — Other Ambulatory Visit: Payer: Self-pay | Admitting: *Deleted

## 2020-07-26 ENCOUNTER — Telehealth: Payer: Self-pay | Admitting: Oncology

## 2020-07-26 VITALS — BP 154/85 | HR 83 | Temp 97.8°F | Resp 18 | Ht 67.0 in | Wt 214.6 lb

## 2020-07-26 DIAGNOSIS — I82401 Acute embolism and thrombosis of unspecified deep veins of right lower extremity: Secondary | ICD-10-CM | POA: Diagnosis not present

## 2020-07-26 DIAGNOSIS — E119 Type 2 diabetes mellitus without complications: Secondary | ICD-10-CM | POA: Diagnosis not present

## 2020-07-26 DIAGNOSIS — Z79899 Other long term (current) drug therapy: Secondary | ICD-10-CM | POA: Insufficient documentation

## 2020-07-26 DIAGNOSIS — K7460 Unspecified cirrhosis of liver: Secondary | ICD-10-CM | POA: Diagnosis not present

## 2020-07-26 DIAGNOSIS — Z7901 Long term (current) use of anticoagulants: Secondary | ICD-10-CM | POA: Diagnosis not present

## 2020-07-26 DIAGNOSIS — C189 Malignant neoplasm of colon, unspecified: Secondary | ICD-10-CM | POA: Diagnosis not present

## 2020-07-26 DIAGNOSIS — D649 Anemia, unspecified: Secondary | ICD-10-CM

## 2020-07-26 DIAGNOSIS — C787 Secondary malignant neoplasm of liver and intrahepatic bile duct: Secondary | ICD-10-CM | POA: Insufficient documentation

## 2020-07-26 DIAGNOSIS — D500 Iron deficiency anemia secondary to blood loss (chronic): Secondary | ICD-10-CM | POA: Insufficient documentation

## 2020-07-26 DIAGNOSIS — K922 Gastrointestinal hemorrhage, unspecified: Secondary | ICD-10-CM | POA: Diagnosis not present

## 2020-07-26 DIAGNOSIS — I10 Essential (primary) hypertension: Secondary | ICD-10-CM | POA: Insufficient documentation

## 2020-07-26 DIAGNOSIS — C180 Malignant neoplasm of cecum: Secondary | ICD-10-CM | POA: Diagnosis present

## 2020-07-26 DIAGNOSIS — C778 Secondary and unspecified malignant neoplasm of lymph nodes of multiple regions: Secondary | ICD-10-CM | POA: Insufficient documentation

## 2020-07-26 LAB — CBC WITH DIFFERENTIAL (CANCER CENTER ONLY)
Abs Immature Granulocytes: 0.01 10*3/uL (ref 0.00–0.07)
Basophils Absolute: 0 10*3/uL (ref 0.0–0.1)
Basophils Relative: 0 %
Eosinophils Absolute: 0.1 10*3/uL (ref 0.0–0.5)
Eosinophils Relative: 2 %
HCT: 21.6 % — ABNORMAL LOW (ref 36.0–46.0)
Hemoglobin: 6.5 g/dL — CL (ref 12.0–15.0)
Immature Granulocytes: 0 %
Lymphocytes Relative: 38 %
Lymphs Abs: 1.9 10*3/uL (ref 0.7–4.0)
MCH: 29 pg (ref 26.0–34.0)
MCHC: 30.1 g/dL (ref 30.0–36.0)
MCV: 96.4 fL (ref 80.0–100.0)
Monocytes Absolute: 0.3 10*3/uL (ref 0.1–1.0)
Monocytes Relative: 7 %
Neutro Abs: 2.6 10*3/uL (ref 1.7–7.7)
Neutrophils Relative %: 53 %
Platelet Count: 142 10*3/uL — ABNORMAL LOW (ref 150–400)
RBC: 2.24 MIL/uL — ABNORMAL LOW (ref 3.87–5.11)
RDW: 17 % — ABNORMAL HIGH (ref 11.5–15.5)
WBC Count: 4.9 10*3/uL (ref 4.0–10.5)
nRBC: 0 % (ref 0.0–0.2)

## 2020-07-26 LAB — SAMPLE TO BLOOD BANK

## 2020-07-26 MED ORDER — SODIUM CHLORIDE 0.9% IV SOLUTION
250.0000 mL | Freq: Once | INTRAVENOUS | Status: AC
  Administered 2020-07-26: 250 mL via INTRAVENOUS
  Filled 2020-07-26: qty 250

## 2020-07-26 NOTE — Progress Notes (Signed)
Will return today at 1 pm for 1 unit blood.

## 2020-07-26 NOTE — Patient Instructions (Signed)

## 2020-07-26 NOTE — Progress Notes (Signed)
[facility] OFFICE PROGRESS NOTE   Diagnosis: Colon cancer, anemia  INTERVAL HISTORY:   Ms. Verrette is referred for an urgent visit by Dr. Perry.  She was admitted last month with GI bleeding.  She underwent an endoscopy and treatment for GAVE.  The hemoglobin returned at 7.2 when she saw Dr. Perry yesterday.  She denies gross bleeding.  The stool is dark.  She continues Xarelto anticoagulation.  She reports malaise and exertional dyspnea.  Objective:  Vital signs in last 24 hours:  Blood pressure (!) 154/85, pulse 83, temperature 97.8 F (36.6 C), temperature source Tympanic, resp. rate 18, height 5' 7" (1.702 m), weight 214 lb 9.6 oz (97.3 kg), SpO2 100 %.    Resp: Decreased breath sounds with coarse rhonchi at the posterior base bilaterally, no respiratory distress Cardio: Regular rate and rhythm GI: Distended, nontender, no hepatosplenomegaly Vascular: Trace lower leg edema bilaterally    Medications: I have reviewed the patient's current medications.    Lab Results:  Lab Results  Component Value Date   WBC 4.9 07/26/2020   HGB 6.5 (LL) 07/26/2020   HCT 21.6 (L) 07/26/2020   MCV 96.4 07/26/2020   PLT 142 (L) 07/26/2020   NEUTROABS 2.6 07/26/2020    CMP  Lab Results  Component Value Date   NA 138 07/25/2020   K 4.8 07/25/2020   CL 107 07/25/2020   CO2 26 07/25/2020   GLUCOSE 144 (H) 07/25/2020   BUN 16 07/25/2020   CREATININE 1.42 (H) 07/25/2020   CALCIUM 8.6 07/25/2020   PROT 5.8 (L) 07/21/2020   ALBUMIN 3.3 (L) 07/21/2020   AST 19 07/21/2020   ALT 11 07/21/2020   ALKPHOS 90 07/21/2020   BILITOT 0.6 07/21/2020   GFRNONAA 35 (L) 07/05/2020   GFRAA 59 (L) 11/17/2017    Lab Results  Component Value Date   CEA1 3.24 06/30/2020    Medications: I have reviewed the patient's current medications.   Assessment/Plan: 1. Stage IIIc (T4b,N2b) moderately differentiated adenocarcinoma the cecum, status post a right colectomy 02/10/2015, tumor  invades through the serosa and into adjacent small bowel, lymphovascular and perineural invasion present, resection margins negative, 8 of 20 lymph nodes positive for metastatic carcinoma. ? Loss of MLH1 and PMS2 expression ? Microsatellite instability-high ? CT chest 03/03/2015-negative for metastatic disease ? Cycle 1 adjuvant Xeloda 03/18/2015 ? Cycle 2 adjuvant Xeloda 04/09/2015 ? Cycle 3 adjuvant Xeloda 04/30/2015 ? Cycle 4 adjuvant Xeloda 05/21/2015 ? Cycle 5 adjuvant Xeloda 06/11/2015 (dose reduced due to desquamation of feet). ? Cycle 6 adjuvant Xeloda 07/02/2015 ? Cycle 7 adjuvant Xeloda 07/23/2015 ? Cycle 8 adjuvant Xeloda 08/13/2015 ? CTs chest, abdomen, and pelvis 02/06/2016-cirrhosis, heterogenous enhancement of the liver, small hypoenhancing lesions in the liver-? 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 Likely regenerative nodules in the liver;does not appear as HCCor metastases. Left renal mass very slow growing. CT scan recommended in one year) ? Colonoscopy 08/06/2016-multiple 5-15 mm very subtle sessile serrated adenomasfound in the transverse colon and at the anastomosis. Next colonoscopy at a one-year interval. ? CT 12/23/2017-no evidence of recurrent colon cancer, cirrhotic liver with no dominant mass, left renal mass-cystic component mildly larger compared to 2017 ? Colonoscopy 06/17/2020-hyperplastic polyps and a tubular adenoma removed from the rectum and colon  2. Anemia, microcytic. Likely iron deficiency and ongoing GI blood loss progressive anemia, status post  red cell transfusion 06/15/2020 and 06/30/2020  2. 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   She is followed by Dr. McKenzie, urology.Unchanged on CT 02/06/2016, unchanged on MRI 12/06/2016, cystic component slightly larger on CT 12/23/2017 3. Diabetes 4. Hypertension 5. History of mild hand/foot syndrome secondary to  Xeloda 6. Enlarging segment 8 liver lesion noted on MRI 12/06/2016-felt to potentially represent a metastasis or HCC  Ultrasound-guided biopsy of the lesion on 02/06/2017 revealed cirrhosis, no malignancy  CT 12/23/2017-chronic liver nodularity, no dominant mass, changes of cirrhosis  CT 04/29/2020- enlarging right liver lesion concerning for HCC 8. Cirrhosis 9. History of mild thrombocytopenia-likely secondary to cirrhosis 10. Right lower extremity DVT 05/11/2020-Xarelto    Disposition: Ms. Wittner remains in clinical remission from colon cancer.  She has cirrhosis and GI bleeding.  She has undergone repeat endoscopy procedures, most recently on 07/04/2020 when she was treated for gastric antral vascular ectasia with bleeding.  She continues to have severe anemia.  We will arrange for Red cell transfusion today.  She will return for a CBC in 1 week and 2 weeks.  She will be scheduled for an office visit in 1 month.  We will arrange for IV iron therapy if the hemoglobin is low.  Ms. Kathan was diagnosed with a right lower extremity DVT in October.  She continues Xarelto anticoagulation.  We will consider discontinuing anticoagulation therapy after she completed 3 months of treatment.  I will present her case at the GI tumor conference to review the recent CT with regard to the suspicious right liver lesion.  I am inclined to discontinue anticoagulation after 3 months if she does not have an active malignancy.  Gary Sherrill, MD  07/26/2020  11:32 AM   

## 2020-07-26 NOTE — Progress Notes (Signed)
Patient agrees to return at 2 pm for transfusion of 1 unit today.

## 2020-07-26 NOTE — Telephone Encounter (Signed)
Scheduled appointments per 1/11 los. Spoke to patient who is aware of appointments dates and times. Gave patient calendar print out.

## 2020-07-27 LAB — BPAM RBC
Blood Product Expiration Date: 202201262359
ISSUE DATE / TIME: 202201111449
Unit Type and Rh: 6200

## 2020-07-27 LAB — TYPE AND SCREEN
ABO/RH(D): A POS
Antibody Screen: NEGATIVE
Unit division: 0

## 2020-07-28 ENCOUNTER — Other Ambulatory Visit: Payer: Self-pay | Admitting: Physician Assistant

## 2020-07-28 NOTE — Telephone Encounter (Signed)
LR: 06-06-2020 Qty: 30 with 0 refills Last office visit: 06-06-2020 Upcoming appointment: No pending appt.

## 2020-07-29 ENCOUNTER — Telehealth: Payer: Self-pay | Admitting: Physician Assistant

## 2020-07-29 DIAGNOSIS — I152 Hypertension secondary to endocrine disorders: Secondary | ICD-10-CM

## 2020-07-29 DIAGNOSIS — E1159 Type 2 diabetes mellitus with other circulatory complications: Secondary | ICD-10-CM

## 2020-07-29 MED ORDER — PROPRANOLOL HCL 40 MG PO TABS: 40.0000 mg | ORAL_TABLET | Freq: Two times a day (BID) | ORAL | 1 refills | Status: DC

## 2020-07-29 NOTE — Telephone Encounter (Signed)
Medication resent to pharmacy  

## 2020-07-29 NOTE — Telephone Encounter (Signed)
Pt  Called in asking if we can resend in the propranolol 40 mg to [facility], she states they don't have it.   Please advise

## 2020-07-29 NOTE — Telephone Encounter (Signed)
Pt made aware

## 2020-08-02 ENCOUNTER — Other Ambulatory Visit: Payer: Self-pay

## 2020-08-02 ENCOUNTER — Inpatient Hospital Stay: Payer: Medicare Other

## 2020-08-02 DIAGNOSIS — C180 Malignant neoplasm of cecum: Secondary | ICD-10-CM | POA: Diagnosis not present

## 2020-08-02 DIAGNOSIS — C189 Malignant neoplasm of colon, unspecified: Secondary | ICD-10-CM

## 2020-08-02 LAB — CBC WITH DIFFERENTIAL (CANCER CENTER ONLY)
Abs Immature Granulocytes: 0.02 10*3/uL (ref 0.00–0.07)
Basophils Absolute: 0 10*3/uL (ref 0.0–0.1)
Basophils Relative: 1 %
Eosinophils Absolute: 0.1 10*3/uL (ref 0.0–0.5)
Eosinophils Relative: 1 %
HCT: 24.6 % — ABNORMAL LOW (ref 36.0–46.0)
Hemoglobin: 7.1 g/dL — ABNORMAL LOW (ref 12.0–15.0)
Immature Granulocytes: 1 %
Lymphocytes Relative: 35 %
Lymphs Abs: 1.4 10*3/uL (ref 0.7–4.0)
MCH: 28.7 pg (ref 26.0–34.0)
MCHC: 28.9 g/dL — ABNORMAL LOW (ref 30.0–36.0)
MCV: 99.6 fL (ref 80.0–100.0)
Monocytes Absolute: 0.2 10*3/uL (ref 0.1–1.0)
Monocytes Relative: 5 %
Neutro Abs: 2.3 10*3/uL (ref 1.7–7.7)
Neutrophils Relative %: 57 %
Platelet Count: 110 10*3/uL — ABNORMAL LOW (ref 150–400)
RBC: 2.47 MIL/uL — ABNORMAL LOW (ref 3.87–5.11)
RDW: 17.3 % — ABNORMAL HIGH (ref 11.5–15.5)
WBC Count: 4.1 10*3/uL (ref 4.0–10.5)
nRBC: 0 % (ref 0.0–0.2)

## 2020-08-02 LAB — SAMPLE TO BLOOD BANK

## 2020-08-03 ENCOUNTER — Telehealth: Payer: Self-pay | Admitting: Nurse Practitioner

## 2020-08-03 ENCOUNTER — Telehealth: Payer: Self-pay

## 2020-08-03 ENCOUNTER — Other Ambulatory Visit: Payer: Self-pay

## 2020-08-03 ENCOUNTER — Other Ambulatory Visit: Payer: Self-pay | Admitting: Nurse Practitioner

## 2020-08-03 DIAGNOSIS — C189 Malignant neoplasm of colon, unspecified: Secondary | ICD-10-CM

## 2020-08-03 DIAGNOSIS — D649 Anemia, unspecified: Secondary | ICD-10-CM

## 2020-08-03 LAB — FERRITIN: Ferritin: 27 ng/mL (ref 11–307)

## 2020-08-03 NOTE — Progress Notes (Signed)
The proposed treatment discussed in conference is for discussion purposes only and is not a binding recommendation.  The patients have not been physically examined, or presented with their treatment options.  Therefore, final treatment plans cannot be decided.   

## 2020-08-03 NOTE — Telephone Encounter (Signed)
I called Cheryl Jordan with instructions to discontinue Xarelto.  We reviewed her upcoming schedule.  She has a Doppler study tomorrow and appointments at the [facility] on 08/05/2020.

## 2020-08-03 NOTE — Telephone Encounter (Signed)
Spoke with pt made her aware of doppler appt at 1pm 08/04/20 pt agreeable to appt

## 2020-08-04 ENCOUNTER — Other Ambulatory Visit: Payer: Self-pay | Admitting: Nurse Practitioner

## 2020-08-04 ENCOUNTER — Ambulatory Visit (HOSPITAL_COMMUNITY)
Admission: RE | Admit: 2020-08-04 | Discharge: 2020-08-04 | Disposition: A | Discharge status: Home / Self Care | Payer: Medicare Other | Source: Ambulatory Visit | Attending: Nurse Practitioner | Admitting: Nurse Practitioner

## 2020-08-04 ENCOUNTER — Ambulatory Visit (INDEPENDENT_AMBULATORY_CARE_PROVIDER_SITE_OTHER): Payer: Medicare Other | Admitting: Physician Assistant

## 2020-08-04 ENCOUNTER — Other Ambulatory Visit: Payer: Self-pay

## 2020-08-04 ENCOUNTER — Encounter: Payer: Self-pay | Admitting: Physician Assistant

## 2020-08-04 VITALS — BP 138/68 | HR 67 | Temp 98.3°F | Resp 16 | Ht 67.0 in | Wt 217.0 lb

## 2020-08-04 DIAGNOSIS — R609 Edema, unspecified: Secondary | ICD-10-CM | POA: Diagnosis not present

## 2020-08-04 DIAGNOSIS — C189 Malignant neoplasm of colon, unspecified: Secondary | ICD-10-CM | POA: Insufficient documentation

## 2020-08-04 DIAGNOSIS — D649 Anemia, unspecified: Secondary | ICD-10-CM

## 2020-08-04 LAB — COMPREHENSIVE METABOLIC PANEL
ALT: 9 U/L (ref 0–35)
AST: 19 U/L (ref 0–37)
Albumin: 3.3 g/dL — ABNORMAL LOW (ref 3.5–5.2)
Alkaline Phosphatase: 84 U/L (ref 39–117)
BUN: 18 mg/dL (ref 6–23)
CO2: 25 mEq/L (ref 19–32)
Calcium: 8.9 mg/dL (ref 8.4–10.5)
Chloride: 112 mEq/L (ref 96–112)
Creatinine, Ser: 1.45 mg/dL — ABNORMAL HIGH (ref 0.40–1.20)
GFR: 34.54 mL/min — ABNORMAL LOW (ref >60.00)
Glucose, Bld: 117 mg/dL — ABNORMAL HIGH (ref 70–99)
Potassium: 4.5 mEq/L (ref 3.5–5.1)
Sodium: 141 mEq/L (ref 135–145)
Total Bilirubin: 0.6 mg/dL (ref 0.2–1.2)
Total Protein: 5.8 g/dL — ABNORMAL LOW (ref 6.0–8.3)

## 2020-08-04 NOTE — Patient Instructions (Signed)
Please go to the lab today for blood work.  I will call you with your results. We will alter treatment regimen(s) if indicated by your results.   Decrease the propranolol to 1/2 tablet twice daily for the next 4-5 days before completely stopping.   For the next 3 days I want you to increase your Furosemide to 1 tablet in the morning and 1/2 tablet in the evening.  Watch swelling and weight over the next few days.  Let me know if not coming down.  Please follow-up with Dr. Benay Spice tomorrow.  We will also need to schedule follow-up with Dr. Henrene Pastor since we may have to keep you off of the Propranolol.

## 2020-08-04 NOTE — CV Procedure (Signed)
RLE venous Duplex completed. Ned Card, NP called with preliminary results.  Results can be found under chart review under CV PROC. 08/04/2020 1:41 PM Ashby Leflore RVT, RDMS

## 2020-08-05 ENCOUNTER — Other Ambulatory Visit: Payer: Self-pay

## 2020-08-05 ENCOUNTER — Inpatient Hospital Stay: Payer: Medicare Other | Admitting: Nurse Practitioner

## 2020-08-05 ENCOUNTER — Inpatient Hospital Stay: Payer: Medicare Other

## 2020-08-05 VITALS — BP 144/59 | HR 73 | Temp 97.6°F | Resp 17 | Ht 67.0 in | Wt 220.4 lb

## 2020-08-05 VITALS — BP 142/58 | HR 74 | Temp 97.7°F | Resp 16

## 2020-08-05 DIAGNOSIS — C189 Malignant neoplasm of colon, unspecified: Secondary | ICD-10-CM

## 2020-08-05 DIAGNOSIS — K922 Gastrointestinal hemorrhage, unspecified: Secondary | ICD-10-CM

## 2020-08-05 DIAGNOSIS — D649 Anemia, unspecified: Secondary | ICD-10-CM

## 2020-08-05 DIAGNOSIS — C180 Malignant neoplasm of cecum: Secondary | ICD-10-CM | POA: Diagnosis not present

## 2020-08-05 LAB — CBC WITH DIFFERENTIAL (CANCER CENTER ONLY)
Abs Immature Granulocytes: 0.01 10*3/uL (ref 0.00–0.07)
Basophils Absolute: 0 10*3/uL (ref 0.0–0.1)
Basophils Relative: 1 %
Eosinophils Absolute: 0.1 10*3/uL (ref 0.0–0.5)
Eosinophils Relative: 2 %
HCT: 25.6 % — ABNORMAL LOW (ref 36.0–46.0)
Hemoglobin: 7.5 g/dL — ABNORMAL LOW (ref 12.0–15.0)
Immature Granulocytes: 0 %
Lymphocytes Relative: 29 %
Lymphs Abs: 1.4 10*3/uL (ref 0.7–4.0)
MCH: 29 pg (ref 26.0–34.0)
MCHC: 29.3 g/dL — ABNORMAL LOW (ref 30.0–36.0)
MCV: 98.8 fL (ref 80.0–100.0)
Monocytes Absolute: 0.3 10*3/uL (ref 0.1–1.0)
Monocytes Relative: 6 %
Neutro Abs: 3 10*3/uL (ref 1.7–7.7)
Neutrophils Relative %: 62 %
Platelet Count: 132 10*3/uL — ABNORMAL LOW (ref 150–400)
RBC: 2.59 MIL/uL — ABNORMAL LOW (ref 3.87–5.11)
RDW: 16.9 % — ABNORMAL HIGH (ref 11.5–15.5)
WBC Count: 4.7 10*3/uL (ref 4.0–10.5)
nRBC: 0 % (ref 0.0–0.2)

## 2020-08-05 LAB — PREPARE RBC (CROSSMATCH)

## 2020-08-05 MED ORDER — SODIUM CHLORIDE 0.9% IV SOLUTION
250.0000 mL | Freq: Once | INTRAVENOUS | Status: AC
  Administered 2020-08-05: 250 mL via INTRAVENOUS
  Filled 2020-08-05: qty 250

## 2020-08-05 MED ORDER — SODIUM CHLORIDE 0.9 % IV SOLN
510.0000 mg | Freq: Once | INTRAVENOUS | Status: AC
  Administered 2020-08-05: 510 mg via INTRAVENOUS
  Filled 2020-08-05: qty 510

## 2020-08-05 NOTE — Patient Instructions (Signed)
Ferumoxytol injection What is this medicine? FERUMOXYTOL is an iron complex. Iron is used to make healthy red blood cells, which carry oxygen and nutrients throughout the body. This medicine is used to treat iron deficiency anemia. This medicine may be used for other purposes; ask your health care provider or pharmacist if you have questions. COMMON BRAND NAME(S): Feraheme What should I tell my health care provider before I take this medicine? They need to know if you have any of these conditions:  anemia not caused by low iron levels  high levels of iron in the blood  magnetic resonance imaging (MRI) test scheduled  an unusual or allergic reaction to iron, other medicines, foods, dyes, or preservatives  pregnant or trying to get pregnant  breast-feeding How should I use this medicine? This medicine is for injection into a vein. It is given by a health care professional in a hospital or clinic setting. Talk to your pediatrician regarding the use of this medicine in children. Special care may be needed. Overdosage: If you think you have taken too much of this medicine contact a poison control center or emergency room at once. NOTE: This medicine is only for you. Do not share this medicine with others. What if I miss a dose? It is important not to miss your dose. Call your doctor or health care professional if you are unable to keep an appointment. What may interact with this medicine? This medicine may interact with the following medications:  other iron products This list may not describe all possible interactions. Give your health care provider a list of all the medicines, herbs, non-prescription drugs, or dietary supplements you use. Also tell them if you smoke, drink alcohol, or use illegal drugs. Some items may interact with your medicine. What should I watch for while using this medicine? Visit your doctor or healthcare professional regularly. Tell your doctor or healthcare  professional if your symptoms do not start to get better or if they get worse. You may need blood work done while you are taking this medicine. You may need to follow a special diet. Talk to your doctor. Foods that contain iron include: whole grains/cereals, dried fruits, beans, or peas, leafy green vegetables, and organ meats (liver, kidney). What side effects may I notice from receiving this medicine? Side effects that you should report to your doctor or health care professional as soon as possible:  allergic reactions like skin rash, itching or hives, swelling of the face, lips, or tongue  breathing problems  changes in blood pressure  feeling faint or lightheaded, falls  fever or chills  flushing, sweating, or hot feelings  swelling of the ankles or feet Side effects that usually do not require medical attention (report to your doctor or health care professional if they continue or are bothersome):  diarrhea  headache  nausea, vomiting  stomach pain This list may not describe all possible side effects. Call your doctor for medical advice about side effects. You may report side effects to FDA at 1-800-FDA-1088. Where should I keep my medicine? This drug is given in a hospital or clinic and will not be stored at home. NOTE: This sheet is a summary. It may not cover all possible information. If you have questions about this medicine, talk to your doctor, pharmacist, or health care provider.  2021 Elsevier/Gold Standard (2016-08-20 20:21:10)  Blood Transfusion, Adult A blood transfusion is a procedure in which you receive blood or a type of blood cell (blood component) through  an IV. You may need a blood transfusion when your blood level is low. This may result from a bleeding disorder, illness, injury, or surgery. The blood may come from a donor. You may also be able to donate blood for yourself (autologous blood donation) before a planned surgery. The blood given in a  transfusion is made up of different blood components. You may receive:  Red blood cells. These carry oxygen to the cells in the body.  Platelets. These help your blood to clot.  Plasma. This is the liquid part of your blood. It carries proteins and other substances throughout the body.  White blood cells. These help you fight infections. If you have hemophilia or another clotting disorder, you may also receive other types of blood products. Tell a health care provider about:  Any blood disorders you have.  Any previous reactions you have had during a blood transfusion.  Any allergies you have.  All medicines you are taking, including vitamins, herbs, eye drops, creams, and over-the-counter medicines.  Any surgeries you have had.  Any medical conditions you have, including any recent fever or cold symptoms.  Whether you are pregnant or may be pregnant. What are the risks? Generally, this is a safe procedure. However, problems may occur.  The most common problems include: ? A mild allergic reaction, such as red, swollen areas of skin (hives) and itching. ? Fever or chills. This may be the body's response to new blood cells received. This may occur during or up to 4 hours after the transfusion.  More serious problems may include: ? Transfusion-associated circulatory overload (TACO), or too much fluid in the lungs. This may cause breathing problems. ? A serious allergic reaction, such as difficulty breathing or swelling around the face and lips. ? Transfusion-related acute lung injury (TRALI), which causes breathing difficulty and low oxygen in the blood. This can occur within hours of the transfusion or several days later. ? Iron overload. This can happen after receiving many blood transfusions over a period of time. ? Infection or virus being transmitted. This is rare because donated blood is carefully tested before it is given. ? Hemolytic transfusion reaction. This is rare. It  happens when your body's defense system (immune system)tries to attack the new blood cells. Symptoms may include fever, chills, nausea, low blood pressure, and low back or chest pain. ? Transfusion-associated graft-versus-host disease (TAGVHD). This is rare. It happens when donated cells attack your body's healthy tissues. What happens before the procedure? Medicines Ask your health care provider about:  Changing or stopping your regular medicines. This is especially important if you are taking diabetes medicines or blood thinners.  Taking medicines such as aspirin and ibuprofen. These medicines can thin your blood. Do not take these medicines unless your health care provider tells you to take them.  Taking over-the-counter medicines, vitamins, herbs, and supplements. General instructions  Follow instructions from your health care provider about eating and drinking restrictions.  You will have a blood test to determine your blood type. This is necessary to know what kind of blood your body will accept and to match it to the donor blood.  If you are going to have a planned surgery, you may be able to do an autologous blood donation. This may be done in case you need to have a transfusion.  You will have your temperature, blood pressure, and pulse monitored before the transfusion.  If you have had an allergic reaction to a transfusion in the past,  you may be given medicine to help prevent a reaction. This medicine may be given to you by mouth (orally) or through an IV.  Set aside time for the blood transfusion. This procedure generally takes 1-4 hours to complete. What happens during the procedure?  An IV will be inserted into one of your veins.  The bag of donated blood will be attached to your IV. The blood will then enter through your vein.  Your temperature, blood pressure, and pulse will be monitored regularly during the transfusion. This monitoring is done to detect early signs of a  transfusion reaction.  Tell your nurse right away if you have any of these symptoms during the transfusion: ? Shortness of breath or trouble breathing. ? Chest or back pain. ? Fever or chills. ? Hives or itching.  If you have any signs or symptoms of a reaction, your transfusion will be stopped and you may be given medicine.  When the transfusion is complete, your IV will be removed.  Pressure may be applied to the IV site for a few minutes.  A bandage (dressing)will be applied. The procedure may vary among health care providers and hospitals.   What happens after the procedure?  Your temperature, blood pressure, pulse, breathing rate, and blood oxygen level will be monitored until you leave the hospital or clinic.  Your blood may be tested to see how you are responding to the transfusion.  You may be warmed with fluids or blankets to maintain a normal body temperature.  If you receive your blood transfusion in an outpatient setting, you will be told whom to contact to report any reactions. Where to find more information For more information on blood transfusions, visit the American Red Cross: redcross.org Summary  A blood transfusion is a procedure in which you receive blood or a type of blood cell (blood component) through an IV.  The blood you receive may come from a donor or be donated by yourself (autologous blood donation) before a planned surgery.  The blood given in a transfusion is made up of different blood components. You may receive red blood cells, platelets, plasma, or white blood cells depending on the condition treated.  Your temperature, blood pressure, and pulse will be monitored before, during, and after the transfusion.  After the transfusion, your blood may be tested to see how your body has responded. This information is not intended to replace advice given to you by your health care provider. Make sure you discuss any questions you have with your health  care provider. Document Revised: 05/07/2019 Document Reviewed: 12/25/2018 Elsevier Patient Education  [place].

## 2020-08-05 NOTE — Progress Notes (Addendum)
[facility] OFFICE PROGRESS NOTE   Diagnosis: Colon cancer, anemia  INTERVAL HISTORY:   Cheryl Jordan returns for follow-up.  She reports stool is dark brown.  She is not aware of any bleeding.  She continues oral iron.  She has occasional nausea.  Abdomen feels more distended.  Objective:  Vital signs in last 24 hours:  Blood pressure (!) 144/59, pulse 73, temperature 97.6 F (36.4 C), temperature source Tympanic, resp. rate 17, height 5' 7" (1.702 m), weight 220 lb 6.4 oz (100 kg), SpO2 100 %.    HEENT: No thrush or ulcers. Resp: Breath sounds diminished at the bases.  No respiratory distress. Cardio: Regular rate and rhythm. GI: Abdomen is distended consistent with ascites. Vascular: Trace to 1+ pitting edema at the lower legs bilaterally. Neuro: Alert and oriented. Skin: Pale appearing.   Lab Results:  Lab Results  Component Value Date   WBC 4.7 08/05/2020   HGB 7.5 (L) 08/05/2020   HCT 25.6 (L) 08/05/2020   MCV 98.8 08/05/2020   PLT 132 (L) 08/05/2020   NEUTROABS 3.0 08/05/2020    Imaging:  VAS [place] LOWER EXTREMITY VENOUS (DVT)  Result Date: 08/04/2020  Lower Venous DVT Study Indications: Follow up exam for DVT. Other Indications: Swelling. Comparison Study: 05/11/20 -Positive DVT (RT PTVs) Performing Technologist: Rogelia Rohrer  Examination Guidelines: A complete evaluation includes B-mode imaging, spectral Doppler, color Doppler, and power Doppler as needed of all accessible portions of each vessel. Bilateral testing is considered an integral part of a complete examination. Limited examinations for reoccurring indications may be performed as noted. The reflux portion of the exam is performed with the patient in reverse Trendelenburg.  +---------+---------------+---------+-----------+-----------------+------------+ RIGHT    CompressibilityPhasicitySpontaneityProperties       Thrombus                                                                   Aging        +---------+---------------+---------+-----------+-----------------+------------+ CFV      Full           Yes      Yes                                      +---------+---------------+---------+-----------+-----------------+------------+ SFJ      Full                                                             +---------+---------------+---------+-----------+-----------------+------------+ FV Prox  Full           Yes      Yes                                      +---------+---------------+---------+-----------+-----------------+------------+ FV Mid   Full           Yes      Yes                                      +---------+---------------+---------+-----------+-----------------+------------+  FV DistalFull           Yes      Yes                                      +---------+---------------+---------+-----------+-----------------+------------+ PFV      Full                                                             +---------+---------------+---------+-----------+-----------------+------------+ POP      Full           Yes      Yes                                      +---------+---------------+---------+-----------+-----------------+------------+ PTV      Partial                            partially        Chronic                                                  re-cannalized                 +---------+---------------+---------+-----------+-----------------+------------+ PERO     Full                                                             +---------+---------------+---------+-----------+-----------------+------------+ One of paired posterior tibial veins recannalized, other shows chronic DVT.  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 - There is no evidence of superficial venous thrombosis.  - No cystic structure found in the popliteal fossa.  LEFT: - No evidence of common femoral vein obstruction.  *See table(s) above for measurements and observations. Electronically signed by Servando Snare MD on 08/04/2020 at 2:26:29 PM.    Final     Medications: I have reviewed the patient's current medications.  Assessment/Plan: 1. 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 ? Loss of MLH1 and PMS2 expression ? Microsatellite instability-high ? CT chest 03/03/2015-negative for metastatic disease ? Cycle 1 adjuvant Xeloda 03/18/2015 ? Cycle 2 adjuvant Xeloda 04/09/2015 ? Cycle 3 adjuvant Xeloda 04/30/2015 ? Cycle 4 adjuvant  Xeloda 05/21/2015 ? Cycle 5 adjuvant Xeloda 06/11/2015 (dose reduced due to desquamation of feet). ? Cycle 6 adjuvant Xeloda 07/02/2015 ? Cycle 7 adjuvant Xeloda 07/23/2015 ? Cycle 8 adjuvant Xeloda 08/13/2015 ? CTs chest, abdomen, and pelvis 02/06/2016-cirrhosis, heterogenous enhancement of the liver, small hypoenhancing lesions in the liver-? 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 Likely regenerative nodules in the liver;does not appear as HCCor metastases. Left renal mass very slow growing. CT scan recommended in one year) ? Colonoscopy 08/06/2016-multiple 5-15 mm very subtle sessile serrated adenomasfound in the transverse colon and at the anastomosis. Next  colonoscopy at a one-year interval. ? CT 12/23/2017-no evidence of recurrent colon cancer, cirrhotic liver with no dominant mass, left renal mass-cystic component mildly larger compared to 2017 ? Colonoscopy 06/17/2020-hyperplastic polyps and a tubular adenoma removed from the rectum and colon  2. Anemia, microcytic. Likely iron deficiency and ongoing GI blood loss progressive anemia, status post  red cell transfusion 06/15/2020, 06/30/2020, 07/26/2020  2. 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 She is followed by Dr. Alyson Ingles, urology.Unchanged on CT 02/06/2016, unchanged on MRI 12/06/2016, cystic component slightly larger on CT 12/23/2017 3. Diabetes 4. Hypertension 5. History of mild hand/foot syndrome secondary to Xeloda 6. Enlarging segment 8 liver lesion noted on MRI 12/06/2016-felt to potentially represent a metastasis or HCC  Ultrasound-guided biopsy of the lesion on 02/06/2017 revealed cirrhosis, no malignancy  CT 12/23/2017-chronic liver nodularity, no dominant mass, changes of cirrhosis  CT 04/29/2020- enlarging right liver lesion concerning for [facility], separate from the lesion biopsied in 2018 8. Cirrhosis 9. History of mild thrombocytopenia-likely secondary to cirrhosis 10. 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  She has cirrhosis and GI bleeding.  She is followed by Dr. Henrene Pastor.  CBC from today shows persistent anemia.  She will receive 1 unit of blood today as well as Feraheme.  She reports receiving IV iron in the past, tolerating well.  We discussed the potential for an allergic reaction.  She agrees to proceed.    Dr. Benay Spice reviewed the Doppler study result from 08/04/2020 with Cheryl Jordan at today's appointment.  She has completed approximately 3 months of  anticoagulation therapy.  We reviewed the risk/benefit of anticoagulation at this time.  In the setting of ongoing GI blood loss Dr. Benay Spice recommends discontinuation of anticoagulation.  She is in agreement.  She discontinued Xarelto on 08/03/2020.  She will contact the office with symptoms of thrombosis.  She will return for lab and IV iron in 1 week.  She will return for lab and follow-up as scheduled on 08/23/2020.  She will follow-up with Dr. Henrene Pastor regarding management of cirrhosis, GI bleeding.  Patient seen with Dr. Benay Spice.    Ned Card ANP/GNP-BC   08/05/2020  10:29 AM   This was a shared visit with Ned Card. We discussed the management of anemia with Cheryl Jordan. We decided to discontinue anticoagulation therapy secondary to the ongoing bleeding requiring red cell transfusion support. I feel she is at low risk for developing a recurrent DVT or PE. 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  She will continue follow-up with Dr. Henrene Pastor for management of portal hypertension with ascites.  Her case was presented at the GI tumor conference earlier this  week. The liver lesion in question on the most recent CT is separate from the lesion biopsied in 2018. The plan is to obtain a liver MRI in the next few months.  She will receive IV iron and a red cell transfusion today.  I was present for greater than 50% of the visit today and performed medical decision making  Julieanne Manson, MD

## 2020-08-05 NOTE — Progress Notes (Signed)
Cbc

## 2020-08-06 LAB — BPAM RBC
Blood Product Expiration Date: 202202172359
ISSUE DATE / TIME: 202201211219
Unit Type and Rh: 6200

## 2020-08-06 LAB — TYPE AND SCREEN
ABO/RH(D): A POS
Antibody Screen: NEGATIVE
Unit division: 0

## 2020-08-08 ENCOUNTER — Telehealth: Payer: Self-pay | Admitting: Nurse Practitioner

## 2020-08-08 NOTE — Telephone Encounter (Signed)
Scheduled appointments per 1/21 los. Spoke to patient who is aware of appointments date and times.

## 2020-08-09 ENCOUNTER — Inpatient Hospital Stay: Payer: Medicare Other

## 2020-08-10 ENCOUNTER — Telehealth (INDEPENDENT_AMBULATORY_CARE_PROVIDER_SITE_OTHER): Payer: Medicare Other | Admitting: Physician Assistant

## 2020-08-10 ENCOUNTER — Encounter: Payer: Self-pay | Admitting: Physician Assistant

## 2020-08-10 VITALS — BP 130/70 | HR 69 | Temp 98.2°F | Resp 14 | Ht 67.0 in | Wt 220.0 lb

## 2020-08-10 DIAGNOSIS — R609 Edema, unspecified: Secondary | ICD-10-CM

## 2020-08-10 MED ORDER — TORSEMIDE 20 MG PO TABS: 20.0000 mg | ORAL_TABLET | Freq: Every day | ORAL | 1 refills | Status: DC

## 2020-08-10 MED ORDER — OMEPRAZOLE 20 MG PO CPDR: 20.0000 mg | DELAYED_RELEASE_CAPSULE | Freq: Two times a day (BID) | ORAL | 1 refills | Status: DC

## 2020-08-10 NOTE — Patient Instructions (Signed)
Please continue low sodium diet. Elevate legs while resting. We will try switching your Furosemide to Torsemide to see if this will work better for you.   I am also switching your Protonix back to Omeprazole at twice daily dosing since you felt this worked a lot better for you.   Start a daily probiotic.  Please call to schedule follow-up with Dr. Henrene Pastor ASAP.  Hang in there!

## 2020-08-10 NOTE — Progress Notes (Signed)
Patient presents to clinic today for follow-up regarding peripheral edema.  At last visit propranolol was decreased as patient was convinced this was contributing to swelling.  Notes that she has not noticed any improvement in her swelling.  Actually noticed mild increase in weight compared to last visit.  Denies shortness of breath, PND or apnea.  Note she did have a follow-up with her hematologist recently who recommended she follow-up with gastroenterology as previously recommended by this provider.  Continues to take her furosemide daily as active.  Is keeping a low salt intake.  Notes home BP has been stable.  Past Medical History:  Diagnosis Date  . Allergy   . Anemia    hx of  . Anxiety   . Arthritis   . Colon cancer ([place]) dx'd 11/2014  . Diabetes mellitus without complication ([place])    TYPE II  . Fatty liver   . Gallbladder disease   . GERD (gastroesophageal reflux disease)   . History of gout   . Hypertension   . Hypothyroidism   . Kidney disorder    spot on left kidney  . Osteoporosis   . PONV (postoperative nausea and vomiting)     Current Outpatient Medications on File Prior to Visit  Medication Sig Dispense Refill  . ACCU-CHEK AVIVA PLUS test strip     . acetaminophen (TYLENOL) 500 MG tablet Take 500 mg by mouth every 6 (six) hours as needed for moderate pain.    Marland Kitchen amLODipine (NORVASC) 2.5 MG tablet Take 1 tablet (2.5 mg total) by mouth daily. 90 tablet 2  . Blood Glucose Monitoring Suppl (ACCU-CHEK AVIVA) device Use as instructed 1 each 0  . ferrous sulfate 325 (65 FE) MG EC tablet Take 1 tablet (325 mg total) by mouth 2 (two) times daily with a meal. (Patient taking differently: Take 325 mg by mouth daily.) 90 tablet 1  . furosemide (LASIX) 40 MG tablet Take 1 tablet (40 mg total) by mouth daily. 30 tablet 6  . glipiZIDE (GLUCOTROL XL) 5 MG 24 hr tablet TAKE (1) TABLET DAILY WITH BREAKFAST. 90 tablet 0  . levothyroxine (SYNTHROID) 88 MCG tablet Take 1 tablet (88  mcg total) by mouth daily. 90 tablet 2  . loperamide (IMODIUM) 2 MG capsule Take 2 capsules by mouth once. Then take additional 6m capsule as needed for continued diarrhea. No more than 16 mg in 24 hours. 30 capsule 0  . LORazepam (ATIVAN) 0.5 MG tablet TAKE 1 TABLET TWICE DAILY AS NEEDED FOR ANXIETY 30 tablet 0  . pantoprazole (PROTONIX) 40 MG tablet Take 1 tablet (40 mg total) by mouth 2 (two) times daily before a meal. 60 tablet 2  . sucralfate (CARAFATE) 1 GM/10ML suspension Take 10 mLs (1 g total) by mouth 4 (four) times daily. 2400 mL 0  . telmisartan (MICARDIS) 40 MG tablet Take 0.5 tablets (20 mg total) by mouth in the morning and at bedtime.    . simethicone (GAS-X) 80 MG chewable tablet Chew 2 tablets (160 mg total) by mouth 4 (four) times daily for 5 days.  0   No current facility-administered medications on file prior to visit.    Allergies  Allergen Reactions  . Aspirin Other (See Comments)    Runny nose   . Codeine Nausea And Vomiting    Family History  Problem Relation Age of Onset  . Diabetes Mother   . Hypertension Other   . COPD Father   . Hypertension Sister   .  GER disease Sister   . Heart attack Brother   . COPD Brother   . Lung cancer Brother   . COPD Brother   . Hypertension Brother     Social History   Socioeconomic History  . Marital status: Married    Spouse name: Not on file  . Number of children: 2  . Years of education: Not on file  . Highest education level: Not on file  Occupational History  . Occupation: Retired  Tobacco Use  . Smoking status: Never Smoker  . Smokeless tobacco: Never Used  Vaping Use  . Vaping Use: Never used  Substance and Sexual Activity  . Alcohol use: No  . Drug use: No  . Sexual activity: Never  Other Topics Concern  . Not on file  Social History Narrative   03/03/15-Married, husband Jeneen Rinks   #2 grown sons and #2 grand daughters   Social Determinants of Health   Financial Resource Strain: Low Risk   .  Difficulty of Paying Living Expenses: Not hard at all  Food Insecurity: No Food Insecurity  . Worried About Charity fundraiser in the Last Year: Never true  . Ran Out of Food in the Last Year: Never true  Transportation Needs: No Transportation Needs  . Lack of Transportation (Medical): No  . Lack of Transportation (Non-Medical): No  Physical Activity: Inactive  . Days of Exercise per Week: 0 days  . Minutes of Exercise per Session: 0 min  Stress: No Stress Concern Present  . Feeling of Stress : Not at all  Social Connections: Socially Integrated  . Frequency of Communication with Friends and Family: More than three times a week  . Frequency of Social Gatherings with Friends and Family: More than three times a week  . Attends Religious Services: More than 4 times per year  . Active Member of Clubs or Organizations: Yes  . Attends Archivist Meetings: More than 4 times per year  . Marital Status: Married    Review of Systems - See HPI.  All other ROS are negative.  BP 130/70   Pulse 69   Temp 98.2 F (36.8 C) (Temporal)   Resp 14   Ht 5' 7"  (1.702 m)   Wt 220 lb (99.8 kg)   SpO2 97%   BMI 34.46 kg/m   Physical Exam Constitutional:      Appearance: Normal appearance.  HENT:     Head: Normocephalic and atraumatic.  Cardiovascular:     Rate and Rhythm: Normal rate and regular rhythm.     Pulses: Normal pulses.     Heart sounds: Normal heart sounds.  Pulmonary:     Effort: Pulmonary effort is normal.  Abdominal:     General: Bowel sounds are normal.     Palpations: Abdomen is soft.  Musculoskeletal:     Cervical back: Neck supple.     Right lower leg: 2+ Edema present.     Left lower leg: 2+ Edema present.  Neurological:     General: No focal deficit present.     Mental Status: She is alert and oriented to person, place, and time.     Recent Results (from the past 2160 hour(s))  Protime-INR     Status: Abnormal   Collection Time: 05/25/20  4:35 PM   Result Value Ref Range   INR 3.0 (H) 0.8 - 1.0 ratio   Prothrombin Time 33.2 (H) 9.6 - 13.1 sec  CBC with Differential/Platelet     Status:  Abnormal   Collection Time: 05/25/20  4:35 PM  Result Value Ref Range   WBC 6.9 4.0 - 10.5 K/uL   RBC 3.97 3.87 - 5.11 Mil/uL   Hemoglobin 11.6 (L) 12.0 - 15.0 g/dL   HCT 35.5 (L) 36.0 - 46.0 %   MCV 89.5 78.0 - 100.0 fl   MCHC 32.6 30.0 - 36.0 g/dL   RDW 16.1 (H) 11.5 - 15.5 %   Platelets 130.0 (L) 150.0 - 400.0 K/uL   Neutrophils Relative % 58.1 43.0 - 77.0 %   Lymphocytes Relative 35.1 12.0 - 46.0 %   Monocytes Relative 5.1 3.0 - 12.0 %   Eosinophils Relative 0.9 0.0 - 5.0 %   Basophils Relative 0.8 0.0 - 3.0 %   Neutro Abs 4.0 1.4 - 7.7 K/uL   Lymphs Abs 2.4 0.7 - 4.0 K/uL   Monocytes Absolute 0.4 0.1 - 1.0 K/uL   Eosinophils Absolute 0.1 0.0 - 0.7 K/uL   Basophils Absolute 0.1 0.0 - 0.1 K/uL  Comprehensive metabolic panel     Status: Abnormal   Collection Time: 05/25/20  4:35 PM  Result Value Ref Range   Sodium 141 135 - 145 mEq/L   Potassium 4.4 3.5 - 5.1 mEq/L   Chloride 106 96 - 112 mEq/L   CO2 28 19 - 32 mEq/L   Glucose, Bld 92 70 - 99 mg/dL   BUN 14 6 - 23 mg/dL   Creatinine, Ser 1.19 0.40 - 1.20 mg/dL   Total Bilirubin 0.9 0.2 - 1.2 mg/dL   Alkaline Phosphatase 110 39 - 117 U/L   AST 27 0 - 37 U/L   ALT 15 0 - 35 U/L   Total Protein 6.2 6.0 - 8.3 g/dL   Albumin 3.3 (L) 3.5 - 5.2 g/dL   GFR 43.84 (L) >60.00 mL/min    Comment: Calculated using the CKD-EPI Creatinine Equation (2021)   Calcium 8.9 8.4 - 10.5 mg/dL  CBC w/Diff     Status: Abnormal   Collection Time: 06/06/20 11:39 AM  Result Value Ref Range   WBC 4.3 4.0 - 10.5 K/uL   RBC 3.13 (L) 3.87 - 5.11 Mil/uL   Hemoglobin 9.3 (L) 12.0 - 15.0 g/dL   HCT 28.6 (L) 36.0 - 46.0 %   MCV 91.3 78.0 - 100.0 fl   MCHC 32.5 30.0 - 36.0 g/dL   RDW 17.4 (H) 11.5 - 15.5 %   Platelets 121.0 (L) 150.0 - 400.0 K/uL   Neutrophils Relative % 57.0 43.0 - 77.0 %   Lymphocytes  Relative 35.4 12.0 - 46.0 %   Monocytes Relative 6.1 3.0 - 12.0 %   Eosinophils Relative 1.0 0.0 - 5.0 %   Basophils Relative 0.5 0.0 - 3.0 %   Neutro Abs 2.4 1.4 - 7.7 K/uL   Lymphs Abs 1.5 0.7 - 4.0 K/uL   Monocytes Absolute 0.3 0.1 - 1.0 K/uL   Eosinophils Absolute 0.0 0.0 - 0.7 K/uL   Basophils Absolute 0.0 0.0 - 0.1 K/uL  Basic metabolic panel     Status: Abnormal   Collection Time: 06/06/20 11:39 AM  Result Value Ref Range   Sodium 139 135 - 145 mEq/L   Potassium 4.5 3.5 - 5.1 mEq/L   Chloride 104 96 - 112 mEq/L   CO2 28 19 - 32 mEq/L   Glucose, Bld 184 (H) 70 - 99 mg/dL   BUN 19 6 - 23 mg/dL   Creatinine, Ser 1.60 (H) 0.40 - 1.20 mg/dL   GFR 30.73 (  L) >60.00 mL/min    Comment: Calculated using the CKD-EPI Creatinine Equation (2021)   Calcium 8.5 8.4 - 10.5 mg/dL  CBC w/Diff     Status: Abnormal   Collection Time: 06/14/20  1:20 PM  Result Value Ref Range   WBC 4.5 4.0 - 10.5 K/uL   RBC 2.97 (L) 3.87 - 5.11 Mil/uL   Hemoglobin 8.6 Repeated and verified X2. (L) 12.0 - 15.0 g/dL   HCT 27.4 (L) 36.0 - 46.0 %   MCV 92.4 78.0 - 100.0 fl   MCHC 31.5 30.0 - 36.0 g/dL   RDW 18.6 (H) 11.5 - 15.5 %   Platelets 119.0 (L) 150.0 - 400.0 K/uL   Neutrophils Relative % 50.7 43.0 - 77.0 %   Lymphocytes Relative 41.8 12.0 - 46.0 %   Monocytes Relative 6.5 3.0 - 12.0 %   Eosinophils Relative 0.5 0.0 - 5.0 %   Basophils Relative 0.5 0.0 - 3.0 %   Neutro Abs 2.3 1.4 - 7.7 K/uL   Lymphs Abs 1.9 0.7 - 4.0 K/uL   Monocytes Absolute 0.3 0.1 - 1.0 K/uL   Eosinophils Absolute 0.0 0.0 - 0.7 K/uL   Basophils Absolute 0.0 0.0 - 0.1 K/uL  CBC with Differential     Status: Abnormal   Collection Time: 06/15/20  9:23 AM  Result Value Ref Range   WBC 4.8 4.0 - 10.5 K/uL   RBC 2.90 (L) 3.87 - 5.11 MIL/uL   Hemoglobin 8.4 (L) 12.0 - 15.0 g/dL   HCT 28.6 (L) 36.0 - 46.0 %   MCV 98.6 80.0 - 100.0 fL   MCH 29.0 26.0 - 34.0 pg   MCHC 29.4 (L) 30.0 - 36.0 g/dL   RDW 17.2 (H) 11.5 - 15.5 %    Platelets 127 (L) 150 - 400 K/uL   nRBC 0.0 0.0 - 0.2 %   Neutrophils Relative % 62 %   Neutro Abs 3.0 1.7 - 7.7 K/uL   Lymphocytes Relative 31 %   Lymphs Abs 1.5 0.7 - 4.0 K/uL   Monocytes Relative 6 %   Monocytes Absolute 0.3 0.1 - 1.0 K/uL   Eosinophils Relative 1 %   Eosinophils Absolute 0.0 0.0 - 0.5 K/uL   Basophils Relative 0 %   Basophils Absolute 0.0 0.0 - 0.1 K/uL   Immature Granulocytes 0 %   Abs Immature Granulocytes 0.01 0.00 - 0.07 K/uL    Comment: Performed at [facility], [place] [address], [place], [place] [address]  Comprehensive metabolic panel     Status: Abnormal   Collection Time: 06/15/20  9:23 AM  Result Value Ref Range   Sodium 142 135 - 145 mmol/L   Potassium 4.7 3.5 - 5.1 mmol/L   Chloride 110 98 - 111 mmol/L   CO2 23 22 - 32 mmol/L   Glucose, Bld 169 (H) 70 - 99 mg/dL    Comment: Glucose reference range applies only to samples taken after fasting for at least 8 hours.   BUN 21 8 - 23 mg/dL   Creatinine, Ser 1.31 (H) 0.44 - 1.00 mg/dL   Calcium 8.8 (L) 8.9 - 10.3 mg/dL   Total Protein 6.2 (L) 6.5 - 8.1 g/dL   Albumin 3.2 (L) 3.5 - 5.0 g/dL   AST 34 15 - 41 U/L   ALT 15 0 - 44 U/L   Alkaline Phosphatase 72 38 - 126 U/L   Total Bilirubin 1.0 0.3 - 1.2 mg/dL   GFR, Estimated 42 (L) >60 mL/min  Comment: (NOTE) Calculated using the CKD-EPI Creatinine Equation (2021)    Anion gap 9 5 - 15    Comment: Performed at [facility], [place] [address], [place], [place] [address]  Protime-INR     Status: Abnormal   Collection Time: 06/15/20  9:23 AM  Result Value Ref Range   Prothrombin Time 15.4 (H) 11.4 - 15.2 seconds   INR 1.3 (H) 0.8 - 1.2    Comment: (NOTE) INR goal varies based on device and disease states. Performed at [facility], [place] [address] [place], [place] [address]   Type and screen     Status: None   Collection Time: 06/15/20  9:23 AM  Result Value Ref Range   ABO/RH(D) A POS     Antibody Screen NEG    Sample Expiration 06/18/2020,2359    Unit Number [id number]    Blood Component Type RED CELLS,LR    Unit division 00    Status of Unit ISSUED,FINAL    Transfusion Status OK TO TRANSFUSE    Crossmatch Result      Compatible Performed at [place] [address] [place], [place] [address]   Prepare RBC (crossmatch)     Status: None   Collection Time: 06/15/20  9:23 AM  Result Value Ref Range   Order Confirmation      ORDER PROCESSED BY BLOOD BANK Performed at [facility], [place] [address], [place], [place] [address]   Troponin I (High Sensitivity)     Status: None   Collection Time: 06/15/20  9:23 AM  Result Value Ref Range   Troponin I (High Sensitivity) 4 <18 ng/L    Comment: (NOTE) Elevated high sensitivity troponin I (hsTnI) values and significant  changes across serial measurements may suggest ACS but many other  chronic and acute conditions are known to elevate hsTnI results.  Refer to the "Links" section for chest pain algorithms and additional  guidance. Performed at [facility], [place] [address], [place], [place] [address]   BPAM RBC     Status: None   Collection Time: 06/15/20  9:23 AM  Result Value Ref Range   ISSUE DATE / TIME [id number]    Blood Product Unit Number [id number]    PRODUCT CODE [id number]    Unit Type and Rh 6200    Blood Product Expiration Date 564332951884   AFP tumor marker     Status: None   Collection Time: 06/15/20  9:23 AM  Result Value Ref Range   AFP, Serum, Tumor Marker 3.6 0.0 - 8.3 ng/mL    Comment: (NOTE) Roche Diagnostics Electrochemiluminescence Immunoassay (ECLIA) Values obtained with different assay methods or kits cannot be used interchangeably.  Results cannot be interpreted as absolute evidence of the presence or absence of malignant disease. This test is not interpretable in pregnant females. Performed At: [facility] [place], [place] [id number] Rush Farmer MD WF:[id number]   Resp Panel by RT-PCR (Flu A&B, Covid) Nasopharyngeal Swab     Status: None   Collection Time: 06/15/20 10:30 AM   Specimen: Nasopharyngeal Swab; Nasopharyngeal(NP) swabs in vial transport medium  Result Value Ref Range   SARS Coronavirus 2 by RT PCR NEGATIVE NEGATIVE    Comment: (NOTE) SARS-CoV-2 target nucleic acids are NOT DETECTED.  The SARS-CoV-2 RNA is generally detectable in upper respiratory specimens during the acute phase of infection. The lowest concentration of SARS-CoV-2 viral copies this assay can detect is 138 copies/mL.  A negative result does not preclude SARS-Cov-2 infection and should not be used as the sole basis for treatment or other patient management decisions. A negative result may occur with  improper specimen collection/handling, submission of specimen other than nasopharyngeal swab, presence of viral mutation(s) within the areas targeted by this assay, and inadequate number of viral copies(<138 copies/mL). A negative result must be combined with clinical observations, patient history, and epidemiological information. The expected result is Negative.  Fact Sheet for Patients:  EntrepreneurPulse.com.au  Fact Sheet for Healthcare Providers:  IncredibleEmployment.be  This test is no t yet approved or cleared by the [place] FDA and  has been authorized for detection and/or diagnosis of SARS-CoV-2 by FDA under an Emergency Use Authorization (EUA). This EUA will remain  in effect (meaning this test can be used) for the duration of the COVID-19 declaration under Section 564(b)(1) of the Act, 21 U.S.C.section 360bbb-3(b)(1), unless the authorization is terminated  or revoked sooner.       Influenza A by PCR NEGATIVE NEGATIVE   Influenza B by PCR NEGATIVE NEGATIVE    Comment: (NOTE) The Xpert Xpress SARS-CoV-2/FLU/RSV plus assay is intended as an  aid in the diagnosis of influenza from Nasopharyngeal swab specimens and should not be used as a sole basis for treatment. Nasal washings and aspirates are unacceptable for Xpert Xpress SARS-CoV-2/FLU/RSV testing.  Fact Sheet for Patients: EntrepreneurPulse.com.au  Fact Sheet for Healthcare Providers: IncredibleEmployment.be  This test is not yet approved or cleared by the [place] FDA and has been authorized for detection and/or diagnosis of SARS-CoV-2 by FDA under an Emergency Use Authorization (EUA). This EUA will remain in effect (meaning this test can be used) for the duration of the COVID-19 declaration under Section 564(b)(1) of the Act, 21 U.S.C. section 360bbb-3(b)(1), unless the authorization is terminated or revoked.  Performed at [facility], [place] [address], [place], [place] [address]   HEMOGLOBIN AND HEMATOCRIT, BLOOD     Status: Abnormal   Collection Time: 06/15/20  3:58 PM  Result Value Ref Range   Hemoglobin 9.4 (L) 12.0 - 15.0 g/dL   HCT 30.7 (L) 36.0 - 46.0 %    Comment: Performed at [facility], [place] [address], [place], [place] [address]  Glucose, capillary     Status: Abnormal   Collection Time: 06/15/20  6:42 PM  Result Value Ref Range   Glucose-Capillary 111 (H) 70 - 99 mg/dL    Comment: Glucose reference range applies only to samples taken after fasting for at least 8 hours.  Glucose, capillary     Status: Abnormal   Collection Time: 06/15/20  9:59 PM  Result Value Ref Range   Glucose-Capillary 101 (H) 70 - 99 mg/dL    Comment: Glucose reference range applies only to samples taken after fasting for at least 8 hours.  HEMOGLOBIN AND HEMATOCRIT, BLOOD     Status: Abnormal   Collection Time: 06/15/20 10:04 PM  Result Value Ref Range   Hemoglobin 8.6 (L) 12.0 - 15.0 g/dL   HCT 27.9 (L) 36.0 - 46.0 %    Comment: Performed at [facility], [place] [address] [place], [place] [address]  HEMOGLOBIN AND HEMATOCRIT, BLOOD     Status: Abnormal   Collection Time: 06/16/20  4:19 AM  Result Value Ref Range   Hemoglobin 9.0 (L) 12.0 - 15.0 g/dL   HCT 29.7 (L) 36.0 - 46.0 %    Comment: Performed at [facility], [address] [place], [place] [address]  Comprehensive metabolic panel     Status: Abnormal   Collection Time: 06/16/20  4:19 AM  Result Value Ref Range   Sodium 140 135 - 145 mmol/L   Potassium 4.1 3.5 - 5.1 mmol/L   Chloride 111 98 - 111 mmol/L   CO2 21 (L) 22 - 32 mmol/L   Glucose, Bld 100 (H) 70 - 99 mg/dL    Comment: Glucose reference range applies only to samples taken after fasting for at least 8 hours.   BUN 16 8 - 23 mg/dL   Creatinine, Ser 1.15 (H) 0.44 - 1.00 mg/dL   Calcium 8.7 (L) 8.9 - 10.3 mg/dL   Total Protein 5.4 (L) 6.5 - 8.1 g/dL   Albumin 3.0 (L) 3.5 - 5.0 g/dL   AST 23 15 - 41 U/L   ALT 13 0 - 44 U/L   Alkaline Phosphatase 68 38 - 126 U/L   Total Bilirubin 1.2 0.3 - 1.2 mg/dL   GFR, Estimated 49 (L) >60 mL/min    Comment: (NOTE) Calculated using the CKD-EPI Creatinine Equation (2021)    Anion gap 8 5 - 15    Comment: Performed at [facility], [place] [address] [place], [place] [address]  Glucose, capillary     Status: Abnormal   Collection Time: 06/16/20  7:31 AM  Result Value Ref Range   Glucose-Capillary 118 (H) 70 - 99 mg/dL    Comment: Glucose reference range applies only to samples taken after fasting for at least 8 hours.  HEMOGLOBIN AND HEMATOCRIT, BLOOD     Status: Abnormal   Collection Time: 06/16/20  9:51 AM  Result Value Ref Range   Hemoglobin 9.5 (L) 12.0 - 15.0 g/dL   HCT 31.8 (L) 36.0 - 46.0 %    Comment: Performed at [facility], [place] [address] [place], [place] [address]  Glucose, capillary     Status: Abnormal   Collection Time: 06/16/20 11:38 AM  Result Value Ref Range   Glucose-Capillary 131 (H) 70 - 99 mg/dL    Comment: Glucose  reference range applies only to samples taken after fasting for at least 8 hours.  HEMOGLOBIN AND HEMATOCRIT, BLOOD     Status: Abnormal   Collection Time: 06/16/20  4:52 PM  Result Value Ref Range   Hemoglobin 9.8 (L) 12.0 - 15.0 g/dL   HCT 33.3 (L) 36.0 - 46.0 %    Comment: Performed at [facility], [place] [address] [place], [place] [address]  Glucose, capillary     Status: Abnormal   Collection Time: 06/16/20  5:25 PM  Result Value Ref Range   Glucose-Capillary 123 (H) 70 - 99 mg/dL    Comment: Glucose reference range applies only to samples taken after fasting for at least 8 hours.  Glucose, capillary     Status: Abnormal   Collection Time: 06/16/20  9:24 PM  Result Value Ref Range   Glucose-Capillary 125 (H) 70 - 99 mg/dL    Comment: Glucose reference range applies only to samples taken after fasting for at least 8 hours.  HEMOGLOBIN AND HEMATOCRIT, BLOOD     Status: Abnormal   Collection Time: 06/16/20 10:56 PM  Result Value Ref Range   Hemoglobin 9.2 (L) 12.0 - 15.0 g/dL    Comment: REPEATED TO VERIFY   HCT 29.4 (L) 36.0 - 46.0 %    Comment: Performed at [facility], [place] [address], [place], [place] [address]  HEMOGLOBIN AND HEMATOCRIT, BLOOD     Status: Abnormal   Collection Time: 06/17/20  5:52 AM  Result Value Ref Range   Hemoglobin 8.8 (L) 12.0 - 15.0 g/dL   HCT 28.4 (L) 36.0 - 46.0 %    Comment: Performed at [facility], [place] [address], [place], [place] [address]  CBC with Differential/Platelet     Status: Abnormal   Collection Time: 06/17/20  5:52 AM  Result Value Ref Range   WBC 4.0 4.0 - 10.5 K/uL   RBC 3.00 (L) 3.87 - 5.11 MIL/uL   Hemoglobin 8.8 (L) 12.0 - 15.0 g/dL   HCT 29.4 (L) 36.0 - 46.0 %   MCV 98.0 80.0 - 100.0 fL   MCH 29.3 26.0 - 34.0 pg   MCHC 29.9 (L) 30.0 - 36.0 g/dL   RDW 17.0 (H) 11.5 - 15.5 %   Platelets 106 (L) 150 - 400 K/uL    Comment: Immature Platelet Fraction may be clinically  indicated, consider ordering this additional test [id number]    nRBC 0.0 0.0 - 0.2 %   Neutrophils Relative % 56 %   Neutro Abs 2.3 1.7 - 7.7 K/uL   Lymphocytes Relative 36 %   Lymphs Abs 1.5 0.7 - 4.0 K/uL   Monocytes Relative 6 %   Monocytes Absolute 0.2 0.1 - 1.0 K/uL   Eosinophils Relative 1 %   Eosinophils Absolute 0.0 0.0 - 0.5 K/uL   Basophils Relative 1 %   Basophils Absolute 0.0 0.0 - 0.1 K/uL   Immature Granulocytes 0 %   Abs Immature Granulocytes 0.01 0.00 - 0.07 K/uL    Comment: Performed at [facility], [place] [address], [place], [place] [address]  Comprehensive metabolic panel     Status: Abnormal   Collection Time: 06/17/20  5:52 AM  Result Value Ref Range   Sodium 139 135 - 145 mmol/L   Potassium 4.3 3.5 - 5.1 mmol/L   Chloride 110 98 - 111 mmol/L   CO2 19 (L) 22 - 32 mmol/L   Glucose, Bld 131 (H) 70 - 99 mg/dL    Comment: Glucose reference range applies only to samples taken after fasting for at least 8 hours.   BUN 12 8 - 23 mg/dL   Creatinine, Ser 1.20 (H) 0.44 - 1.00 mg/dL   Calcium 8.7 (L) 8.9 - 10.3 mg/dL   Total Protein 5.3 (L) 6.5 - 8.1 g/dL   Albumin 2.8 (L) 3.5 - 5.0 g/dL   AST 26 15 - 41 U/L   ALT 15 0 - 44 U/L   Alkaline Phosphatase 67 38 - 126 U/L   Total Bilirubin 0.9 0.3 - 1.2 mg/dL   GFR, Estimated 46 (L) >60 mL/min    Comment: (NOTE) Calculated using the CKD-EPI Creatinine Equation (2021)    Anion gap 10 5 - 15    Comment: Performed at [facility], [place] [address], [place], [place] [address]  Magnesium     Status: None   Collection Time: 06/17/20  5:52 AM  Result Value Ref Range   Magnesium 1.8 1.7 - 2.4 mg/dL    Comment: Performed at [facility], [place] [address] [place],  [address]  Phosphorus     Status: None   Collection Time: 06/17/20  5:52 AM  Result Value Ref Range   Phosphorus 3.6 2.5 - 4.6 mg/dL    Comment: Performed at [facility], [place]  [address] [place],  [address]  Glucose, capillary     Status: Abnormal   Collection Time: 06/17/20  7:30 AM  Result Value Ref Range   Glucose-Capillary 129 (H) 70 - 99 mg/dL    Comment: Glucose reference range applies only to samples taken after fasting for at least 8 hours.  HEMOGLOBIN AND HEMATOCRIT, BLOOD     Status: Abnormal   Collection Time: 06/17/20 10:29 AM  Result Value Ref Range   Hemoglobin 9.0 (L) 12.0 - 15.0 g/dL   HCT 29.9 (L) 36.0 - 46.0 %    Comment: Performed at [facility], [place] [address] [place], [place] [address]  Surgical pathology     Status: None   Collection Time: 06/17/20  1:21 PM  Result Value Ref Range   SURGICAL PATHOLOGY      SURGICAL PATHOLOGY CASE: [id number] PATIENT: [facility] Surgical Pathology Report     Clinical History: Anemia, hemoccult positive stool, rectal bleeding (crm)     FINAL MICROSCOPIC DIAGNOSIS:  A. DUODENUM, BIOPSY: - Unremarkable duodenal mucosa. - No features of celiac sprue or granulomas.  B. STOMACH, BIOPSY: - Unremarkable gastric mucosa. - Warthin-Starry negative for Helicobacter pylori. - No intestinal metaplasia, dysplasia or carcinoma.  C. COLON, TRANSVERSE, DESCENDING, SIGMOID, RECTUM, POLYPECTOMY: - Tubular adenoma (1). - No high-grade dysplasia or carcinoma. - Hyperplastic polyp (s).  D. RECTUM, BIOPSY: - Unremarkable colonic mucosa with benign lymphoid aggregates. - No active inflammation, chronic changes or granulomas.    GROSS DESCRIPTION:  A: Received in formalin are tan, soft tissue fragments that are submitted in toto. Number: 4 size: Range from 0.1 to 0.6 cm blocks: 1  B: Received in formalin is a tan, soft tissue fragment tha t is submitted in toto.  Size: 0.4 cm, 1 block submitted.  C: Received in formalin are tan, soft tissue fragments that are submitted in toto. Number: Multiple size: Range from 0.3 to 1.2 x 0.4 x 0.2 cm blocks: 1  D: Received in  formalin are tan, soft tissue fragments that are submitted in toto. Number: Multiple size: Range from 0.1 to 0.4 cm blocks: 1 Craig Staggers 06/17/2020)    Final Diagnosis performed by Claudette Laws, MD.   Electronically signed 06/20/2020 Technical and / or Professional components performed at [facility], [place] [address], [place], [place] [address].  Immunohistochemistry Technical component (if applicable) was performed at [facility]. [address] [place], [place], [place] [address].   IMMUNOHISTOCHEMISTRY DISCLAIMER (if applicable): Some of these immunohistochemical stains may have been developed and the performance characteristics determine by [facility]. Some may not have been cleared or approve d by the U.S. Food and Drug Administration. The FDA has determined that such clearance or approval is not necessary. This test is used for clinical purposes. It should not be regarded as investigational or for research. This laboratory is certified under the [place] (CLIA-88) as qualified to perform high complexity clinical laboratory testing.  The controls stained appropriately.   HEMOGLOBIN AND HEMATOCRIT, BLOOD     Status: Abnormal   Collection Time: 06/17/20  4:34 PM  Result Value Ref Range   Hemoglobin 9.5 (L) 12.0 - 15.0 g/dL   HCT 31.4 (L) 36.0 - 46.0 %    Comment: Performed at [facility], [place] [address], [place], [place] [address]  Glucose, capillary     Status: Abnormal   Collection Time: 06/17/20  5:51 PM  Result Value Ref Range   Glucose-Capillary 105 (H) 70 -  99 mg/dL    Comment: Glucose reference range applies only to samples taken after fasting for at least 8 hours.   Comment 1 Notify RN    Comment 2 Document in Chart   Glucose, capillary     Status: Abnormal   Collection Time: 06/17/20  9:09 PM  Result Value Ref Range   Glucose-Capillary 129 (H) 70 - 99 mg/dL     Comment: Glucose reference range applies only to samples taken after fasting for at least 8 hours.  HEMOGLOBIN AND HEMATOCRIT, BLOOD     Status: Abnormal   Collection Time: 06/17/20 10:23 PM  Result Value Ref Range   Hemoglobin 9.9 (L) 12.0 - 15.0 g/dL   HCT 32.5 (L) 36.0 - 46.0 %    Comment: Performed at [facility], [place] [address], [place], [place] [address]  Glucose, capillary     Status: Abnormal   Collection Time: 06/18/20 12:23 AM  Result Value Ref Range   Glucose-Capillary 131 (H) 70 - 99 mg/dL    Comment: Glucose reference range applies only to samples taken after fasting for at least 8 hours.  Glucose, capillary     Status: Abnormal   Collection Time: 06/18/20  4:16 AM  Result Value Ref Range   Glucose-Capillary 126 (H) 70 - 99 mg/dL    Comment: Glucose reference range applies only to samples taken after fasting for at least 8 hours.  Glucose, capillary     Status: Abnormal   Collection Time: 06/18/20  7:50 AM  Result Value Ref Range   Glucose-Capillary 119 (H) 70 - 99 mg/dL    Comment: Glucose reference range applies only to samples taken after fasting for at least 8 hours.  CBC with Differential/Platelet     Status: Abnormal   Collection Time: 06/18/20  7:58 AM  Result Value Ref Range   WBC 4.0 4.0 - 10.5 K/uL   RBC 2.98 (L) 3.87 - 5.11 MIL/uL   Hemoglobin 9.0 (L) 12.0 - 15.0 g/dL   HCT 29.3 (L) 36.0 - 46.0 %   MCV 98.3 80.0 - 100.0 fL   MCH 30.2 26.0 - 34.0 pg   MCHC 30.7 30.0 - 36.0 g/dL   RDW 16.9 (H) 11.5 - 15.5 %   Platelets 101 (L) 150 - 400 K/uL    Comment: REPEATED TO VERIFY PLATELET COUNT CONFIRMED BY SMEAR SPECIMEN CHECKED FOR CLOTS Immature Platelet Fraction may be clinically indicated, consider ordering this additional test [id number]    nRBC 0.0 0.0 - 0.2 %   Neutrophils Relative % 58 %   Neutro Abs 2.4 1.7 - 7.7 K/uL   Lymphocytes Relative 34 %   Lymphs Abs 1.4 0.7 - 4.0 K/uL   Monocytes Relative 5 %   Monocytes Absolute 0.2  0.1 - 1.0 K/uL   Eosinophils Relative 2 %   Eosinophils Absolute 0.1 0.0 - 0.5 K/uL   Basophils Relative 1 %   Basophils Absolute 0.0 0.0 - 0.1 K/uL   Immature Granulocytes 0 %   Abs Immature Granulocytes 0.00 0.00 - 0.07 K/uL   Tear Drop Cells PRESENT     Comment: Performed at [facility], [place] [address], [place], [place] [address]  Comprehensive metabolic panel     Status: Abnormal   Collection Time: 06/18/20  7:58 AM  Result Value Ref Range   Sodium 137 135 - 145 mmol/L   Potassium 4.0 3.5 - 5.1 mmol/L   Chloride 106 98 - 111 mmol/L   CO2 19 (  L) 22 - 32 mmol/L   Glucose, Bld 133 (H) 70 - 99 mg/dL    Comment: Glucose reference range applies only to samples taken after fasting for at least 8 hours.   BUN 12 8 - 23 mg/dL   Creatinine, Ser 1.13 (H) 0.44 - 1.00 mg/dL   Calcium 8.4 (L) 8.9 - 10.3 mg/dL   Total Protein 5.5 (L) 6.5 - 8.1 g/dL   Albumin 2.9 (L) 3.5 - 5.0 g/dL   AST 22 15 - 41 U/L   ALT 13 0 - 44 U/L   Alkaline Phosphatase 76 38 - 126 U/L   Total Bilirubin 0.7 0.3 - 1.2 mg/dL   GFR, Estimated 50 (L) >60 mL/min    Comment: (NOTE) Calculated using the CKD-EPI Creatinine Equation (2021)    Anion gap 12 5 - 15    Comment: Performed at [facility], [place] [address], [place], [place] [address]  Magnesium     Status: None   Collection Time: 06/18/20  7:58 AM  Result Value Ref Range   Magnesium 1.8 1.7 - 2.4 mg/dL    Comment: Performed at [facility], [place] [address], [place], [place] [address]  Phosphorus     Status: None   Collection Time: 06/18/20  7:58 AM  Result Value Ref Range   Phosphorus 3.4 2.5 - 4.6 mg/dL    Comment: Performed at [facility], [place] [address] [place], [place] [address]  Glucose, capillary     Status: Abnormal   Collection Time: 06/18/20 12:07 PM  Result Value Ref Range   Glucose-Capillary 137 (H) 70 - 99 mg/dL    Comment: Glucose reference range applies only to  samples taken after fasting for at least 8 hours.  CBC w/Diff     Status: Abnormal   Collection Time: 06/23/20 11:16 AM  Result Value Ref Range   WBC 4.1 4.0 - 10.5 K/uL   RBC 3.18 (L) 3.87 - 5.11 Mil/uL   Hemoglobin 9.6 (L) 12.0 - 15.0 g/dL   HCT 29.8 (L) 36.0 - 46.0 %   MCV 93.8 78.0 - 100.0 fl   MCHC 32.1 30.0 - 36.0 g/dL   RDW 17.9 (H) 11.5 - 15.5 %   Platelets 112.0 (L) 150.0 - 400.0 K/uL   Neutrophils Relative % 61.6 43.0 - 77.0 %   Lymphocytes Relative 31.1 12.0 - 46.0 %   Monocytes Relative 5.1 3.0 - 12.0 %   Eosinophils Relative 1.5 0.0 - 5.0 %   Basophils Relative 0.7 0.0 - 3.0 %   Neutro Abs 2.5 1.4 - 7.7 K/uL   Lymphs Abs 1.3 0.7 - 4.0 K/uL   Monocytes Absolute 0.2 0.1 - 1.0 K/uL   Eosinophils Absolute 0.1 0.0 - 0.7 K/uL   Basophils Absolute 0.0 0.0 - 0.1 K/uL  Comp Met (CMET)     Status: Abnormal   Collection Time: 06/23/20 11:16 AM  Result Value Ref Range   Sodium 139 135 - 145 mEq/L   Potassium 4.5 3.5 - 5.1 mEq/L   Chloride 109 96 - 112 mEq/L   CO2 25 19 - 32 mEq/L   Glucose, Bld 175 (H) 70 - 99 mg/dL   BUN 20 6 - 23 mg/dL   Creatinine, Ser 1.25 (H) 0.40 - 1.20 mg/dL   Total Bilirubin 0.6 0.2 - 1.2 mg/dL   Alkaline Phosphatase 72 39 - 117 U/L   AST 27 0 - 37 U/L   ALT 12 0 - 35 U/L  Total Protein 5.7 (L) 6.0 - 8.3 g/dL   Albumin 3.3 (L) 3.5 - 5.2 g/dL   GFR 41.31 (L) >60.00 mL/min    Comment: Calculated using the CKD-EPI Creatinine Equation (2021)   Calcium 8.5 8.4 - 10.5 mg/dL  CEA (IN HOUSE-[facility])     Status: None   Collection Time: 06/30/20  9:52 AM  Result Value Ref Range   CEA ([facility]-In House) 3.24 0.00 - 5.00 ng/mL    Comment: (NOTE) This test was performed using [employer]'s Chemiluminescent Microparticle Immunoassay. Values obtained from different assay methods cannot be used interchangeably. Please note that 5-10% of patients who smoke may see CEA levels up to 6.9 ng/mL. Performed at [facility], [place] [address],  [place], [place] [address]   Sample to Blood Bank     Status: None   Collection Time: 06/30/20 11:49 AM  Result Value Ref Range   Blood Bank Specimen SAMPLE AVAILABLE FOR TESTING    Sample Expiration      07/03/2020,2359 Performed at [facility], [place] [address], [place], [place] [address]   CBC with Differential ([place] Only)     Status: Abnormal   Collection Time: 06/30/20 11:50 AM  Result Value Ref Range   WBC Count 5.9 4.0 - 10.5 K/uL   RBC 2.03 (L) 3.87 - 5.11 MIL/uL   Hemoglobin 6.4 (LL) 12.0 - 15.0 g/dL    Comment: This critical result has verified and been called to [place], RN by SUTCAVAGE, PAM on 12 16 2021 at 1210, and has been read back.    HCT 21.3 (L) 36.0 - 46.0 %   MCV 104.9 (H) 80.0 - 100.0 fL   MCH 31.5 26.0 - 34.0 pg   MCHC 30.0 30.0 - 36.0 g/dL   RDW 20.6 (H) 11.5 - 15.5 %   Platelet Count 140 (L) 150 - 400 K/uL   nRBC 0.0 0.0 - 0.2 %   Neutrophils Relative % 54 %   Neutro Abs 3.2 1.7 - 7.7 K/uL   Lymphocytes Relative 40 %   Lymphs Abs 2.4 0.7 - 4.0 K/uL   Monocytes Relative 4 %   Monocytes Absolute 0.3 0.1 - 1.0 K/uL   Eosinophils Relative 1 %   Eosinophils Absolute 0.1 0.0 - 0.5 K/uL   Basophils Relative 1 %   Basophils Absolute 0.0 0.0 - 0.1 K/uL   Immature Granulocytes 0 %   Abs Immature Granulocytes 0.02 0.00 - 0.07 K/uL    Comment: Performed at [facility], [place] [address], [place], [place] [address]  Comprehensive metabolic panel     Status: Abnormal   Collection Time: 06/30/20  1:01 PM  Result Value Ref Range   Sodium 140 135 - 145 mmol/L   Potassium 4.3 3.5 - 5.1 mmol/L   Chloride 109 98 - 111 mmol/L   CO2 23 22 - 32 mmol/L   Glucose, Bld 116 (H) 70 - 99 mg/dL    Comment: Glucose reference range applies only to samples taken after fasting for at least 8 hours.   BUN 49 (H) 8 - 23 mg/dL   Creatinine, Ser 1.97 (H) 0.44 - 1.00 mg/dL   Calcium 8.8 (L) 8.9 - 10.3 mg/dL   Total Protein 5.7 (L) 6.5  - 8.1 g/dL   Albumin 2.9 (L) 3.5 - 5.0 g/dL   AST 27 15 - 41 U/L   ALT 17 0 - 44 U/L   Alkaline Phosphatase 69 38 - 126 U/L  Total Bilirubin 1.0 0.3 - 1.2 mg/dL   GFR, Estimated 26 (L) >60 mL/min    Comment: (NOTE) Calculated using the CKD-EPI Creatinine Equation (2021)    Anion gap 8 5 - 15    Comment: Performed at [facility], [place] [address] [place], [place] [address]  Protime-INR     Status: Abnormal   Collection Time: 06/30/20  1:01 PM  Result Value Ref Range   Prothrombin Time 22.9 (H) 11.4 - 15.2 seconds   INR 2.1 (H) 0.8 - 1.2    Comment: (NOTE) INR goal varies based on device and disease states. Performed at [facility], [place] [address] [place], [place] [address]   Resp Panel by RT-PCR (Flu A&B, Covid) Nasopharyngeal Swab     Status: None   Collection Time: 06/30/20  1:26 PM   Specimen: Nasopharyngeal Swab; Nasopharyngeal(NP) swabs in vial transport medium  Result Value Ref Range   SARS Coronavirus 2 by RT PCR NEGATIVE NEGATIVE    Comment: (NOTE) SARS-CoV-2 target nucleic acids are NOT DETECTED.  The SARS-CoV-2 RNA is generally detectable in upper respiratory specimens during the acute phase of infection. The lowest concentration of SARS-CoV-2 viral copies this assay can detect is 138 copies/mL. A negative result does not preclude SARS-Cov-2 infection and should not be used as the sole basis for treatment or other patient management decisions. A negative result may occur with  improper specimen collection/handling, submission of specimen other than nasopharyngeal swab, presence of viral mutation(s) within the areas targeted by this assay, and inadequate number of viral copies(<138 copies/mL). A negative result must be combined with clinical observations, patient history, and epidemiological information. The expected result is Negative.  Fact Sheet for Patients:  EntrepreneurPulse.com.au  Fact Sheet for  Healthcare Providers:  IncredibleEmployment.be  This test is no t yet approved or cleared by the [place] FDA and  has been authorized for detection and/or diagnosis of SARS-CoV-2 by FDA under an Emergency Use Authorization (EUA). This EUA will remain  in effect (meaning this test can be used) for the duration of the COVID-19 declaration under Section 564(b)(1) of the Act, 21 U.S.C.section 360bbb-3(b)(1), unless the authorization is terminated  or revoked sooner.       Influenza A by PCR NEGATIVE NEGATIVE   Influenza B by PCR NEGATIVE NEGATIVE    Comment: (NOTE) The Xpert Xpress SARS-CoV-2/FLU/RSV plus assay is intended as an aid in the diagnosis of influenza from Nasopharyngeal swab specimens and should not be used as a sole basis for treatment. Nasal washings and aspirates are unacceptable for Xpert Xpress SARS-CoV-2/FLU/RSV testing.  Fact Sheet for Patients: EntrepreneurPulse.com.au  Fact Sheet for Healthcare Providers: IncredibleEmployment.be  This test is not yet approved or cleared by the [place] FDA and has been authorized for detection and/or diagnosis of SARS-CoV-2 by FDA under an Emergency Use Authorization (EUA). This EUA will remain in effect (meaning this test can be used) for the duration of the COVID-19 declaration under Section 564(b)(1) of the Act, 21 U.S.C. section 360bbb-3(b)(1), unless the authorization is terminated or revoked.  Performed at [facility], [place] [address] [place], [place] [address]   Prepare RBC (crossmatch)     Status: None   Collection Time: 06/30/20  1:26 PM  Result Value Ref Range   Order Confirmation      ORDER PROCESSED BY BLOOD BANK Performed at [place] [address], [place], [place] [address]   Type and screen [facility]     Status: None   Collection Time: 06/30/20  1:50 PM  Result Value Ref Range    ABO/RH(D) A POS    Antibody Screen NEG    Sample Expiration 07/03/2020,2359    Unit Number [id number]    Blood Component Type RBC LR PHER1    Unit division 00    Status of Unit ISSUED,FINAL    Transfusion Status OK TO TRANSFUSE    Crossmatch Result      Compatible Performed at [facility], [place] [address], [place], [place] [address]    Unit Number [id number]    Blood Component Type RBC LR PHER2    Unit division 00    Status of Unit ISSUED,FINAL    Transfusion Status OK TO TRANSFUSE    Crossmatch Result Compatible   BPAM RBC     Status: None   Collection Time: 06/30/20  1:50 PM  Result Value Ref Range   ISSUE DATE / TIME 768088110315    Blood Product Unit Number [id number]    PRODUCT CODE [id number]    Unit Type and Rh 1657    Blood Product Expiration Date 903833383291    ISSUE DATE / TIME 916606004599    Blood Product Unit Number [id number]    PRODUCT CODE [id number]    Unit Type and Rh 6200    Blood Product Expiration Date 168372902111   Glucose, capillary     Status: Abnormal   Collection Time: 06/30/20 10:52 PM  Result Value Ref Range   Glucose-Capillary 154 (H) 70 - 99 mg/dL    Comment: Glucose reference range applies only to samples taken after fasting for at least 8 hours.  Ferritin     Status: None   Collection Time: 07/01/20 12:32 AM  Result Value Ref Range   Ferritin 85 11 - 307 ng/mL    Comment: Performed at [facility], [place] [address], [place], [place] [address]  Iron and TIBC     Status: Abnormal   Collection Time: 07/01/20 12:32 AM  Result Value Ref Range   Iron 127 28 - 170 ug/dL   TIBC 364 250 - 450 ug/dL   Saturation Ratios 35 (H) 10.4 - 31.8 %   UIBC 237 ug/dL    Comment: Performed at [facility], [place] [address], [place], [place] [address]  Hemoglobin and hematocrit, blood     Status: Abnormal   Collection Time: 07/01/20 12:32 AM  Result Value Ref Range   Hemoglobin 8.6 (L) 12.0  - 15.0 g/dL   HCT 27.0 (L) 36.0 - 46.0 %    Comment: Performed at [facility], [place] [address], [place],  [address]  Basic metabolic panel     Status: Abnormal   Collection Time: 07/01/20 12:32 AM  Result Value Ref Range   Sodium 142 135 - 145 mmol/L   Potassium 5.0 3.5 - 5.1 mmol/L   Chloride 111 98 - 111 mmol/L   CO2 20 (L) 22 - 32 mmol/L   Glucose, Bld 181 (H) 70 - 99 mg/dL    Comment: Glucose reference range applies only to samples taken after fasting for at least 8 hours.   BUN 44 (H) 8 - 23 mg/dL   Creatinine, Ser 1.93 (H) 0.44 - 1.00 mg/dL   Calcium 8.6 (L) 8.9 - 10.3 mg/dL   GFR, Estimated 26 (L) >60 mL/min    Comment: (NOTE) Calculated using the CKD-EPI Creatinine Equation (2021)    Anion gap 11  5 - 15    Comment: Performed at [facility], [place] [address] [place], [place] [address]  CBC     Status: Abnormal   Collection Time: 07/01/20 12:32 AM  Result Value Ref Range   WBC 4.8 4.0 - 10.5 K/uL   RBC 2.77 (L) 3.87 - 5.11 MIL/uL   Hemoglobin 8.5 (L) 12.0 - 15.0 g/dL   HCT 27.7 (L) 36.0 - 46.0 %   MCV 100.0 80.0 - 100.0 fL   MCH 30.7 26.0 - 34.0 pg   MCHC 30.7 30.0 - 36.0 g/dL   RDW 21.7 (H) 11.5 - 15.5 %   Platelets 115 (L) 150 - 400 K/uL    Comment: REPEATED TO VERIFY PLATELET COUNT CONFIRMED BY SMEAR SPECIMEN CHECKED FOR CLOTS Immature Platelet Fraction may be clinically indicated, consider ordering this additional test [id number]    nRBC 0.0 0.0 - 0.2 %    Comment: Performed at [facility], [place] [address], [place], [place] [address]  Protime-INR Once     Status: Abnormal   Collection Time: 07/01/20 12:32 AM  Result Value Ref Range   Prothrombin Time 18.1 (H) 11.4 - 15.2 seconds   INR 1.6 (H) 0.8 - 1.2    Comment: (NOTE) INR goal varies based on device and disease states. Performed at [facility], [place] [address], [place], [place] [address]   Glucose, capillary     Status:  Abnormal   Collection Time: 07/01/20  7:38 AM  Result Value Ref Range   Glucose-Capillary 141 (H) 70 - 99 mg/dL    Comment: Glucose reference range applies only to samples taken after fasting for at least 8 hours.  Glucose, capillary     Status: Abnormal   Collection Time: 07/01/20 11:41 AM  Result Value Ref Range   Glucose-Capillary 130 (H) 70 - 99 mg/dL    Comment: Glucose reference range applies only to samples taken after fasting for at least 8 hours.  Glucose, capillary     Status: Abnormal   Collection Time: 07/01/20  5:44 PM  Result Value Ref Range   Glucose-Capillary 106 (H) 70 - 99 mg/dL    Comment: Glucose reference range applies only to samples taken after fasting for at least 8 hours.  Glucose, capillary     Status: Abnormal   Collection Time: 07/01/20  9:38 PM  Result Value Ref Range   Glucose-Capillary 132 (H) 70 - 99 mg/dL    Comment: Glucose reference range applies only to samples taken after fasting for at least 8 hours.   Comment 1 Notify RN   Basic metabolic panel     Status: Abnormal   Collection Time: 07/02/20  5:29 AM  Result Value Ref Range   Sodium 143 135 - 145 mmol/L   Potassium 4.4 3.5 - 5.1 mmol/L   Chloride 112 (H) 98 - 111 mmol/L   CO2 22 22 - 32 mmol/L   Glucose, Bld 139 (H) 70 - 99 mg/dL    Comment: Glucose reference range applies only to samples taken after fasting for at least 8 hours.   BUN 32 (H) 8 - 23 mg/dL   Creatinine, Ser 1.53 (H) 0.44 - 1.00 mg/dL   Calcium 8.2 (L) 8.9 - 10.3 mg/dL   GFR, Estimated 35 (L) >60 mL/min    Comment: (NOTE) Calculated using the CKD-EPI Creatinine Equation (2021)    Anion gap 9 5 - 15    Comment: Performed at [facility], [place]  [address], [place], [place] [address]  CBC     Status: Abnormal   Collection Time: 07/02/20  5:29 AM  Result Value Ref Range   WBC 3.2 (L) 4.0 - 10.5 K/uL   RBC 2.48 (L) 3.87 - 5.11 MIL/uL   Hemoglobin 7.6 (L) 12.0 - 15.0 g/dL   HCT 25.3 (L) 36.0 - 46.0 %   MCV  102.0 (H) 80.0 - 100.0 fL   MCH 30.6 26.0 - 34.0 pg   MCHC 30.0 30.0 - 36.0 g/dL   RDW 22.1 (H) 11.5 - 15.5 %   Platelets 93 (L) 150 - 400 K/uL    Comment: REPEATED TO VERIFY Immature Platelet Fraction may be clinically indicated, consider ordering this additional test [id number] CONSISTENT WITH PREVIOUS RESULT    nRBC 0.0 0.0 - 0.2 %    Comment: Performed at [facility], [place] [address] [place], [place] [address]  Magnesium     Status: None   Collection Time: 07/02/20  5:29 AM  Result Value Ref Range   Magnesium 2.0 1.7 - 2.4 mg/dL    Comment: Performed at [facility], [place] [address], [place], [place] [address]  Glucose, capillary     Status: Abnormal   Collection Time: 07/02/20  7:37 AM  Result Value Ref Range   Glucose-Capillary 135 (H) 70 - 99 mg/dL    Comment: Glucose reference range applies only to samples taken after fasting for at least 8 hours.  Glucose, capillary     Status: Abnormal   Collection Time: 07/02/20 11:41 AM  Result Value Ref Range   Glucose-Capillary 202 (H) 70 - 99 mg/dL    Comment: Glucose reference range applies only to samples taken after fasting for at least 8 hours.  Hemoglobin and hematocrit, blood     Status: Abnormal   Collection Time: 07/02/20  2:33 PM  Result Value Ref Range   Hemoglobin 8.4 (L) 12.0 - 15.0 g/dL   HCT 27.2 (L) 36.0 - 46.0 %    Comment: Performed at [facility], [place] [address], [place], [place] [address]  Glucose, capillary     Status: Abnormal   Collection Time: 07/02/20  4:18 PM  Result Value Ref Range   Glucose-Capillary 111 (H) 70 - 99 mg/dL    Comment: Glucose reference range applies only to samples taken after fasting for at least 8 hours.  Glucose, capillary     Status: Abnormal   Collection Time: 07/02/20  9:26 PM  Result Value Ref Range   Glucose-Capillary 211 (H) 70 - 99 mg/dL    Comment: Glucose reference range applies only to samples taken after fasting  for at least 8 hours.  Basic metabolic panel     Status: Abnormal   Collection Time: 07/03/20  5:20 AM  Result Value Ref Range   Sodium 141 135 - 145 mmol/L   Potassium 4.2 3.5 - 5.1 mmol/L   Chloride 110 98 - 111 mmol/L   CO2 21 (L) 22 - 32 mmol/L   Glucose, Bld 170 (H) 70 - 99 mg/dL    Comment: Glucose reference range applies only to samples taken after fasting for at least 8 hours.   BUN 25 (H) 8 - 23 mg/dL   Creatinine, Ser 1.49 (H) 0.44 - 1.00 mg/dL   Calcium 8.2 (L) 8.9 - 10.3 mg/dL   GFR, Estimated 36 (L) >60 mL/min    Comment: (NOTE) Calculated using the CKD-EPI Creatinine Equation (2021)    Anion gap 10  5 - 15    Comment: Performed at [facility], [place] [address] [place], [place] [address]  CBC     Status: Abnormal   Collection Time: 07/03/20  5:20 AM  Result Value Ref Range   WBC 3.8 (L) 4.0 - 10.5 K/uL   RBC 2.57 (L) 3.87 - 5.11 MIL/uL   Hemoglobin 8.0 (L) 12.0 - 15.0 g/dL   HCT 26.3 (L) 36.0 - 46.0 %   MCV 102.3 (H) 80.0 - 100.0 fL   MCH 31.1 26.0 - 34.0 pg   MCHC 30.4 30.0 - 36.0 g/dL   RDW 21.4 (H) 11.5 - 15.5 %   Platelets 96 (L) 150 - 400 K/uL    Comment: REPEATED TO VERIFY Immature Platelet Fraction may be clinically indicated, consider ordering this additional test [id number] CONSISTENT WITH PREVIOUS RESULT    nRBC 0.0 0.0 - 0.2 %    Comment: Performed at [facility], [place] [address], [place], [place] [address]  Glucose, capillary     Status: Abnormal   Collection Time: 07/03/20  7:37 AM  Result Value Ref Range   Glucose-Capillary 178 (H) 70 - 99 mg/dL    Comment: Glucose reference range applies only to samples taken after fasting for at least 8 hours.  Glucose, capillary     Status: Abnormal   Collection Time: 07/03/20 11:05 AM  Result Value Ref Range   Glucose-Capillary 145 (H) 70 - 99 mg/dL    Comment: Glucose reference range applies only to samples taken after fasting for at least 8 hours.  Glucose, capillary      Status: Abnormal   Collection Time: 07/03/20  4:32 PM  Result Value Ref Range   Glucose-Capillary 146 (H) 70 - 99 mg/dL    Comment: Glucose reference range applies only to samples taken after fasting for at least 8 hours.  Glucose, capillary     Status: Abnormal   Collection Time: 07/03/20  9:38 PM  Result Value Ref Range   Glucose-Capillary 145 (H) 70 - 99 mg/dL    Comment: Glucose reference range applies only to samples taken after fasting for at least 8 hours.   Comment 1 Notify RN   Basic metabolic panel     Status: Abnormal   Collection Time: 07/04/20  4:48 AM  Result Value Ref Range   Sodium 143 135 - 145 mmol/L   Potassium 4.3 3.5 - 5.1 mmol/L   Chloride 111 98 - 111 mmol/L   CO2 20 (L) 22 - 32 mmol/L   Glucose, Bld 156 (H) 70 - 99 mg/dL    Comment: Glucose reference range applies only to samples taken after fasting for at least 8 hours.   BUN 21 8 - 23 mg/dL   Creatinine, Ser 1.47 (H) 0.44 - 1.00 mg/dL   Calcium 8.3 (L) 8.9 - 10.3 mg/dL   GFR, Estimated 36 (L) >60 mL/min    Comment: (NOTE) Calculated using the CKD-EPI Creatinine Equation (2021)    Anion gap 12 5 - 15    Comment: Performed at [facility], [place] [address], [place], [place] [address]  CBC     Status: Abnormal   Collection Time: 07/04/20  4:48 AM  Result Value Ref Range   WBC 4.4 4.0 - 10.5 K/uL   RBC 2.66 (L) 3.87 - 5.11 MIL/uL   Hemoglobin 8.2 (L) 12.0 - 15.0 g/dL   HCT 27.7 (L) 36.0 - 46.0 %   MCV 104.1 (H) 80.0 - 100.0 fL  MCH 30.8 26.0 - 34.0 pg   MCHC 29.6 (L) 30.0 - 36.0 g/dL   RDW 21.2 (H) 11.5 - 15.5 %   Platelets 95 (L) 150 - 400 K/uL    Comment: Immature Platelet Fraction may be clinically indicated, consider ordering this additional test [id number] CONSISTENT WITH PREVIOUS RESULT    nRBC 0.0 0.0 - 0.2 %    Comment: Performed at [facility], [place] [address] [place], [place] [address]  Glucose, capillary     Status: Abnormal   Collection Time:  07/04/20  7:51 AM  Result Value Ref Range   Glucose-Capillary 143 (H) 70 - 99 mg/dL    Comment: Glucose reference range applies only to samples taken after fasting for at least 8 hours.  Glucose, capillary     Status: Abnormal   Collection Time: 07/04/20 11:43 AM  Result Value Ref Range   Glucose-Capillary 169 (H) 70 - 99 mg/dL    Comment: Glucose reference range applies only to samples taken after fasting for at least 8 hours.  Glucose, capillary     Status: Abnormal   Collection Time: 07/04/20  4:33 PM  Result Value Ref Range   Glucose-Capillary 121 (H) 70 - 99 mg/dL    Comment: Glucose reference range applies only to samples taken after fasting for at least 8 hours.  Basic metabolic panel     Status: Abnormal   Collection Time: 07/05/20  4:51 AM  Result Value Ref Range   Sodium 139 135 - 145 mmol/L   Potassium 4.5 3.5 - 5.1 mmol/L   Chloride 109 98 - 111 mmol/L   CO2 22 22 - 32 mmol/L   Glucose, Bld 172 (H) 70 - 99 mg/dL    Comment: Glucose reference range applies only to samples taken after fasting for at least 8 hours.   BUN 18 8 - 23 mg/dL   Creatinine, Ser 1.51 (H) 0.44 - 1.00 mg/dL   Calcium 8.5 (L) 8.9 - 10.3 mg/dL   GFR, Estimated 35 (L) >60 mL/min    Comment: (NOTE) Calculated using the CKD-EPI Creatinine Equation (2021)    Anion gap 8 5 - 15    Comment: Performed at [facility], [place] [address], [place], [place] [address]  CBC     Status: Abnormal   Collection Time: 07/05/20  4:51 AM  Result Value Ref Range   WBC 4.5 4.0 - 10.5 K/uL   RBC 2.68 (L) 3.87 - 5.11 MIL/uL   Hemoglobin 8.5 (L) 12.0 - 15.0 g/dL   HCT 27.6 (L) 36.0 - 46.0 %   MCV 103.0 (H) 80.0 - 100.0 fL   MCH 31.7 26.0 - 34.0 pg   MCHC 30.8 30.0 - 36.0 g/dL   RDW 20.1 (H) 11.5 - 15.5 %   Platelets 95 (L) 150 - 400 K/uL    Comment: REPEATED TO VERIFY Immature Platelet Fraction may be clinically indicated, consider ordering this additional test [id number] CONSISTENT WITH PREVIOUS  RESULT    nRBC 0.0 0.0 - 0.2 %    Comment: Performed at [facility], [place] [address] [place], [place] [address]  Glucose, capillary     Status: Abnormal   Collection Time: 07/05/20  8:01 AM  Result Value Ref Range   Glucose-Capillary 152 (H) 70 - 99 mg/dL    Comment: Glucose reference range applies only to samples taken after fasting for at least 8 hours.  Glucose, capillary     Status: Abnormal   Collection Time:  07/05/20 11:55 AM  Result Value Ref Range   Glucose-Capillary 172 (H) 70 - 99 mg/dL    Comment: Glucose reference range applies only to samples taken after fasting for at least 8 hours.  Glucose, capillary     Status: Abnormal   Collection Time: 07/05/20  4:52 PM  Result Value Ref Range   Glucose-Capillary 119 (H) 70 - 99 mg/dL    Comment: Glucose reference range applies only to samples taken after fasting for at least 8 hours.  Iron     Status: Abnormal   Collection Time: 07/21/20 11:33 AM  Result Value Ref Range   Iron 26 (L) 42 - 145 ug/dL  CBC w/Diff     Status: Abnormal   Collection Time: 07/21/20 11:33 AM  Result Value Ref Range   WBC 5.2 4.0 - 10.5 K/uL   RBC 2.38 Repeated and verified X2. (L) 3.87 - 5.11 Mil/uL   Hemoglobin 7.2 Repeated and verified X2. (LL) 12.0 - 15.0 g/dL   HCT 22.2 Repeated and verified X2. (LL) 36.0 - 46.0 %   MCV 93.1 78.0 - 100.0 fl   MCHC 32.3 30.0 - 36.0 g/dL   RDW 17.8 (H) 11.5 - 15.5 %   Platelets 147.0 (L) 150.0 - 400.0 K/uL   Neutrophils Relative % 56.3 43.0 - 77.0 %   Lymphocytes Relative 34.7 12.0 - 46.0 %   Monocytes Relative 6.6 3.0 - 12.0 %   Eosinophils Relative 1.6 0.0 - 5.0 %   Basophils Relative 0.8 0.0 - 3.0 %   Neutro Abs 2.9 1.4 - 7.7 K/uL   Lymphs Abs 1.8 0.7 - 4.0 K/uL   Monocytes Absolute 0.3 0.1 - 1.0 K/uL   Eosinophils Absolute 0.1 0.0 - 0.7 K/uL   Basophils Absolute 0.0 0.0 - 0.1 K/uL  Comp Met (CMET)     Status: Abnormal   Collection Time: 07/21/20 11:33 AM  Result Value Ref Range    Sodium 141 135 - 145 mEq/L   Potassium 5.3 No hemolysis seen (H) 3.5 - 5.1 mEq/L   Chloride 108 96 - 112 mEq/L   CO2 27 19 - 32 mEq/L   Glucose, Bld 140 (H) 70 - 99 mg/dL   BUN 22 6 - 23 mg/dL   Creatinine, Ser 1.70 (H) 0.40 - 1.20 mg/dL   Total Bilirubin 0.6 0.2 - 1.2 mg/dL   Alkaline Phosphatase 90 39 - 117 U/L   AST 19 0 - 37 U/L   ALT 11 0 - 35 U/L   Total Protein 5.8 (L) 6.0 - 8.3 g/dL   Albumin 3.3 (L) 3.5 - 5.2 g/dL   GFR 28.55 (L) >60.00 mL/min    Comment: Calculated using the CKD-EPI Creatinine Equation (2021)   Calcium 8.6 8.4 - 10.5 mg/dL  Ferritin     Status: None   Collection Time: 07/21/20 11:33 AM  Result Value Ref Range   Ferritin 13.5 10.0 - 291.0 ng/mL  Iron,Total/Total Iron Binding Cap     Status: Abnormal   Collection Time: 07/21/20 11:33 AM  Result Value Ref Range   Iron 21 (L) 45 - 160 mcg/dL   TIBC 336 250 - 450 mcg/dL (calc)   %SAT 6 (L) 16 - 45 % (calc)  Basic metabolic panel     Status: Abnormal   Collection Time: 07/25/20 11:31 AM  Result Value Ref Range   Sodium 138 135 - 145 mEq/L   Potassium 4.8 3.5 - 5.1 mEq/L   Chloride 107 96 - 112 mEq/L  CO2 26 19 - 32 mEq/L   Glucose, Bld 144 (H) 70 - 99 mg/dL   BUN 16 6 - 23 mg/dL   Creatinine, Ser 1.42 (H) 0.40 - 1.20 mg/dL   GFR 35.43 (L) >60.00 mL/min    Comment: Calculated using the CKD-EPI Creatinine Equation (2021)   Calcium 8.6 8.4 - 10.5 mg/dL  CBC with Differential/Platelet     Status: Abnormal   Collection Time: 07/25/20 11:31 AM  Result Value Ref Range   WBC 4.9 4.0 - 10.5 K/uL   RBC 2.44 (L) 3.87 - 5.11 Mil/uL   Hemoglobin 7.2 Repeated and verified X2. (LL) 12.0 - 15.0 g/dL   HCT 22.2 (LL) 36.0 - 46.0 %   MCV 91.1 78.0 - 100.0 fl   MCHC 32.3 30.0 - 36.0 g/dL   RDW 18.4 (H) 11.5 - 15.5 %   Platelets 155.0 150.0 - 400.0 K/uL   Neutrophils Relative % 58.0 43.0 - 77.0 %   Lymphocytes Relative 34.3 12.0 - 46.0 %   Monocytes Relative 5.7 3.0 - 12.0 %   Eosinophils Relative 1.3 0.0 - 5.0  %   Basophils Relative 0.7 0.0 - 3.0 %   Neutro Abs 2.8 1.4 - 7.7 K/uL   Lymphs Abs 1.7 0.7 - 4.0 K/uL   Monocytes Absolute 0.3 0.1 - 1.0 K/uL   Eosinophils Absolute 0.1 0.0 - 0.7 K/uL   Basophils Absolute 0.0 0.0 - 0.1 K/uL  Sample to Blood Bank     Status: None   Collection Time: 07/26/20 10:34 AM  Result Value Ref Range   Blood Bank Specimen SAMPLE AVAILABLE FOR TESTING    Sample Expiration      07/29/2020,2359 Performed at [facility], [place] [address], [place], [place] [address]   CBC with Differential ([facility] Only)     Status: Abnormal   Collection Time: 07/26/20 10:34 AM  Result Value Ref Range   WBC Count 4.9 4.0 - 10.5 K/uL   RBC 2.24 (L) 3.87 - 5.11 MIL/uL   Hemoglobin 6.5 (LL) 12.0 - 15.0 g/dL    Comment: REPEATED TO VERIFY THIS CRITICAL RESULT HAS VERIFIED AND BEEN CALLED TO SUSAN COWARD RN BY SCOTTON, JAY ON 01 11 2022 AT 1051, AND HAS BEEN READ BACK.     HCT 21.6 (L) 36.0 - 46.0 %   MCV 96.4 80.0 - 100.0 fL   MCH 29.0 26.0 - 34.0 pg   MCHC 30.1 30.0 - 36.0 g/dL   RDW 17.0 (H) 11.5 - 15.5 %   Platelet Count 142 (L) 150 - 400 K/uL   nRBC 0.0 0.0 - 0.2 %   Neutrophils Relative % 53 %   Neutro Abs 2.6 1.7 - 7.7 K/uL   Lymphocytes Relative 38 %   Lymphs Abs 1.9 0.7 - 4.0 K/uL   Monocytes Relative 7 %   Monocytes Absolute 0.3 0.1 - 1.0 K/uL   Eosinophils Relative 2 %   Eosinophils Absolute 0.1 0.0 - 0.5 K/uL   Basophils Relative 0 %   Basophils Absolute 0.0 0.0 - 0.1 K/uL   Immature Granulocytes 0 %   Abs Immature Granulocytes 0.01 0.00 - 0.07 K/uL    Comment: Performed at [facility], [place] [address], [place],  [address]  Type and screen     Status: None   Collection Time: 07/26/20 11:37 AM  Result Value Ref Range   ABO/RH(D) A POS    Antibody Screen NEG    Sample Expiration 07/29/2020,2359  Unit Number [id number]    Blood Component Type RED CELLS,LR    Unit division 00    Status of Unit  ISSUED,FINAL    Transfusion Status OK TO TRANSFUSE    Crossmatch Result      Compatible Performed at [place] [address], [place], [place] [address]   BPAM RBC     Status: None   Collection Time: 07/26/20 11:37 AM  Result Value Ref Range   ISSUE DATE / TIME [id number]    Blood Product Unit Number [id number]    PRODUCT CODE [id number]    Unit Type and Rh 6200    Blood Product Expiration Date [id number]   Sample to Blood Bank     Status: None   Collection Time: 08/02/20 10:48 AM  Result Value Ref Range   Blood Bank Specimen SAMPLE AVAILABLE FOR TESTING    Sample Expiration      08/05/2020,2359 Performed at [facility], [place] [address] [place], [place] [address]   CBC with Differential ([place] Only)     Status: Abnormal   Collection Time: 08/02/20 10:49 AM  Result Value Ref Range   WBC Count 4.1 4.0 - 10.5 K/uL   RBC 2.47 (L) 3.87 - 5.11 MIL/uL   Hemoglobin 7.1 (L) 12.0 - 15.0 g/dL   HCT 24.6 (L) 36.0 - 46.0 %   MCV 99.6 80.0 - 100.0 fL   MCH 28.7 26.0 - 34.0 pg   MCHC 28.9 (L) 30.0 - 36.0 g/dL   RDW 17.3 (H) 11.5 - 15.5 %   Platelet Count 110 (L) 150 - 400 K/uL   nRBC 0.0 0.0 - 0.2 %   Neutrophils Relative % 57 %   Neutro Abs 2.3 1.7 - 7.7 K/uL   Lymphocytes Relative 35 %   Lymphs Abs 1.4 0.7 - 4.0 K/uL   Monocytes Relative 5 %   Monocytes Absolute 0.2 0.1 - 1.0 K/uL   Eosinophils Relative 1 %   Eosinophils Absolute 0.1 0.0 - 0.5 K/uL   Basophils Relative 1 %   Basophils Absolute 0.0 0.0 - 0.1 K/uL   Immature Granulocytes 1 %   Abs Immature Granulocytes 0.02 0.00 - 0.07 K/uL    Comment: Performed at [facility], [place] [address] [place], [place] [address]  Ferritin     Status: None   Collection Time: 08/02/20 10:49 AM  Result Value Ref Range   Ferritin 27 11 - 307 ng/mL    Comment: Performed at [facility], [place] [address] [place], [place] [address]  Comp  Met (CMET)     Status: Abnormal   Collection Time: 08/04/20 12:01 PM  Result Value Ref Range   Sodium 141 135 - 145 mEq/L   Potassium 4.5 3.5 - 5.1 mEq/L   Chloride 112 96 - 112 mEq/L   CO2 25 19 - 32 mEq/L   Glucose, Bld 117 (H) 70 - 99 mg/dL   BUN 18 6 - 23 mg/dL   Creatinine, Ser 1.45 (H) 0.40 - 1.20 mg/dL   Total Bilirubin 0.6 0.2 - 1.2 mg/dL   Alkaline Phosphatase 84 39 - 117 U/L   AST 19 0 - 37 U/L   ALT 9 0 - 35 U/L   Total Protein 5.8 (L) 6.0 - 8.3 g/dL   Albumin 3.3 (L) 3.5 - 5.2 g/dL   GFR 34.54 (L) >60.00 mL/min    Comment: Calculated using the CKD-EPI Creatinine Equation (2021)   Calcium  8.9 8.4 - 10.5 mg/dL  Type and screen     Status: None   Collection Time: 08/05/20  9:38 AM  Result Value Ref Range   ABO/RH(D) A POS    Antibody Screen NEG    Sample Expiration 08/08/2020,2359    Unit Number [id number]    Blood Component Type RED CELLS,LR    Unit division 00    Status of Unit ISSUED,FINAL    Transfusion Status OK TO TRANSFUSE    Crossmatch Result      Compatible Performed at [place] [address], [place], [place] [address]   BPAM RBC     Status: None   Collection Time: 08/05/20  9:38 AM  Result Value Ref Range   ISSUE DATE / TIME 202201211219    Blood Product Unit Number [id number]    PRODUCT CODE [id number]    Unit Type and Rh 6200    Blood Product Expiration Date [id number]   CBC with Differential ([facility] Only)     Status: Abnormal   Collection Time: 08/05/20  9:50 AM  Result Value Ref Range   WBC Count 4.7 4.0 - 10.5 K/uL   RBC 2.59 (L) 3.87 - 5.11 MIL/uL   Hemoglobin 7.5 (L) 12.0 - 15.0 g/dL   HCT 25.6 (L) 36.0 - 46.0 %   MCV 98.8 80.0 - 100.0 fL   MCH 29.0 26.0 - 34.0 pg   MCHC 29.3 (L) 30.0 - 36.0 g/dL   RDW 16.9 (H) 11.5 - 15.5 %   Platelet Count 132 (L) 150 - 400 K/uL   nRBC 0.0 0.0 - 0.2 %   Neutrophils Relative % 62 %   Neutro Abs 3.0 1.7 - 7.7 K/uL   Lymphocytes Relative 29 %   Lymphs Abs 1.4 0.7  - 4.0 K/uL   Monocytes Relative 6 %   Monocytes Absolute 0.3 0.1 - 1.0 K/uL   Eosinophils Relative 2 %   Eosinophils Absolute 0.1 0.0 - 0.5 K/uL   Basophils Relative 1 %   Basophils Absolute 0.0 0.0 - 0.1 K/uL   Immature Granulocytes 0 %   Abs Immature Granulocytes 0.01 0.00 - 0.07 K/uL    Comment: Performed at [facility], [place] [address], [place], [place] [address]  Prepare RBC (crossmatch)     Status: None   Collection Time: 08/05/20  4:21 PM  Result Value Ref Range   Order Confirmation      ORDER PROCESSED BY BLOOD BANK Performed at [place] [address], [place], [place] [address]     Assessment/Plan: 1. 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  Continue with elevation and compression.  Discussed adjustment in her fluid pills.  Will attempt trial of torsemide in place of furosemide to see if we can get more substantial diuresis at an equitable dose.  She does have follow-up scheduled with Dr. Henrene Pastor.  This visit occurred during the SARS-CoV-2 public health emergency.  Safety protocols were in place, including screening questions prior to the visit, additional usage of staff PPE, and extensive cleaning of exam room while observing appropriate contact time as indicated for disinfecting solutions.     Leeanne Rio, PA-C

## 2020-08-12 ENCOUNTER — Inpatient Hospital Stay: Payer: Medicare Other

## 2020-08-12 ENCOUNTER — Other Ambulatory Visit: Payer: Self-pay

## 2020-08-12 VITALS — BP 135/54 | HR 64 | Temp 98.7°F | Resp 18 | Ht 67.0 in | Wt 208.0 lb

## 2020-08-12 DIAGNOSIS — K922 Gastrointestinal hemorrhage, unspecified: Secondary | ICD-10-CM

## 2020-08-12 DIAGNOSIS — D649 Anemia, unspecified: Secondary | ICD-10-CM

## 2020-08-12 DIAGNOSIS — C180 Malignant neoplasm of cecum: Secondary | ICD-10-CM | POA: Diagnosis not present

## 2020-08-12 DIAGNOSIS — C189 Malignant neoplasm of colon, unspecified: Secondary | ICD-10-CM

## 2020-08-12 LAB — CBC WITH DIFFERENTIAL (CANCER CENTER ONLY)
Abs Immature Granulocytes: 0.01 10*3/uL (ref 0.00–0.07)
Basophils Absolute: 0 10*3/uL (ref 0.0–0.1)
Basophils Relative: 1 %
Eosinophils Absolute: 0.1 10*3/uL (ref 0.0–0.5)
Eosinophils Relative: 2 %
HCT: 32.5 % — ABNORMAL LOW (ref 36.0–46.0)
Hemoglobin: 9.4 g/dL — ABNORMAL LOW (ref 12.0–15.0)
Immature Granulocytes: 0 %
Lymphocytes Relative: 31 %
Lymphs Abs: 1.4 10*3/uL (ref 0.7–4.0)
MCH: 28.5 pg (ref 26.0–34.0)
MCHC: 28.9 g/dL — ABNORMAL LOW (ref 30.0–36.0)
MCV: 98.5 fL (ref 80.0–100.0)
Monocytes Absolute: 0.4 10*3/uL (ref 0.1–1.0)
Monocytes Relative: 8 %
Neutro Abs: 2.7 10*3/uL (ref 1.7–7.7)
Neutrophils Relative %: 58 %
Platelet Count: 132 10*3/uL — ABNORMAL LOW (ref 150–400)
RBC: 3.3 MIL/uL — ABNORMAL LOW (ref 3.87–5.11)
RDW: 18.7 % — ABNORMAL HIGH (ref 11.5–15.5)
WBC Count: 4.7 10*3/uL (ref 4.0–10.5)
nRBC: 0 % (ref 0.0–0.2)

## 2020-08-12 LAB — FERRITIN: Ferritin: 179 ng/mL (ref 11–307)

## 2020-08-12 LAB — SAMPLE TO BLOOD BANK

## 2020-08-12 MED ORDER — SODIUM CHLORIDE 0.9 % IV SOLN
Freq: Once | INTRAVENOUS | Status: AC
  Filled 2020-08-12: qty 250

## 2020-08-12 MED ORDER — FERUMOXYTOL INJECTION 510 MG/17 ML
510.0000 mg | Freq: Once | INTRAVENOUS | Status: AC
  Administered 2020-08-12: 510 mg via INTRAVENOUS
  Filled 2020-08-12: qty 510

## 2020-08-12 NOTE — Patient Instructions (Signed)
Ferumoxytol injection What is this medicine? FERUMOXYTOL is an iron complex. Iron is used to make healthy red blood cells, which carry oxygen and nutrients throughout the body. This medicine is used to treat iron deficiency anemia. This medicine may be used for other purposes; ask your health care provider or pharmacist if you have questions. COMMON BRAND NAME(S): Feraheme What should I tell my health care provider before I take this medicine? They need to know if you have any of these conditions:  anemia not caused by low iron levels  high levels of iron in the blood  magnetic resonance imaging (MRI) test scheduled  an unusual or allergic reaction to iron, other medicines, foods, dyes, or preservatives  pregnant or trying to get pregnant  breast-feeding How should I use this medicine? This medicine is for injection into a vein. It is given by a health care professional in a hospital or clinic setting. Talk to your pediatrician regarding the use of this medicine in children. Special care may be needed. Overdosage: If you think you have taken too much of this medicine contact a poison control center or emergency room at once. NOTE: This medicine is only for you. Do not share this medicine with others. What if I miss a dose? It is important not to miss your dose. Call your doctor or health care professional if you are unable to keep an appointment. What may interact with this medicine? This medicine may interact with the following medications:  other iron products This list may not describe all possible interactions. Give your health care provider a list of all the medicines, herbs, non-prescription drugs, or dietary supplements you use. Also tell them if you smoke, drink alcohol, or use illegal drugs. Some items may interact with your medicine. What should I watch for while using this medicine? Visit your doctor or healthcare professional regularly. Tell your doctor or healthcare  professional if your symptoms do not start to get better or if they get worse. You may need blood work done while you are taking this medicine. You may need to follow a special diet. Talk to your doctor. Foods that contain iron include: whole grains/cereals, dried fruits, beans, or peas, leafy green vegetables, and organ meats (liver, kidney). What side effects may I notice from receiving this medicine? Side effects that you should report to your doctor or health care professional as soon as possible:  allergic reactions like skin rash, itching or hives, swelling of the face, lips, or tongue  breathing problems  changes in blood pressure  feeling faint or lightheaded, falls  fever or chills  flushing, sweating, or hot feelings  swelling of the ankles or feet Side effects that usually do not require medical attention (report to your doctor or health care professional if they continue or are bothersome):  diarrhea  headache  nausea, vomiting  stomach pain This list may not describe all possible side effects. Call your doctor for medical advice about side effects. You may report side effects to FDA at 1-800-FDA-1088. Where should I keep my medicine? This drug is given in a hospital or clinic and will not be stored at home. NOTE: This sheet is a summary. It may not cover all possible information. If you have questions about this medicine, talk to your doctor, pharmacist, or health care provider.  2021 Elsevier/Gold Standard (2016-08-20 20:21:10)  

## 2020-08-12 NOTE — Progress Notes (Signed)
Patient presents to clinic today c/o to discuss ongoing peripheral edema.  Patient with history of of hepatic cirrhosis, GERD, diabetes, hypertension, hypothyroidism and osteoporosis.  Is currently on Lasix daily from her GI doctor for swelling.  Has been started on propranolol to help reduce worsening of esophageal varices.  States she feels that the swelling has worsened since starting this medication.  States she read the package insert and noted that this was a potential side effect.  She would like to stop medicine.  Denies any increased work of breathing, lightheadedness or dizziness.  Endorses taking all other prescribed medications as directed.  Past Medical History:  Diagnosis Date  . Allergy   . Anemia    hx of  . Anxiety   . Arthritis   . Colon cancer ([place]) dx'd 11/2014  . Diabetes mellitus without complication ([place])    TYPE II  . Fatty liver   . Gallbladder disease   . GERD (gastroesophageal reflux disease)   . History of gout   . Hypertension   . Hypothyroidism   . Kidney disorder    spot on left kidney  . Osteoporosis   . PONV (postoperative nausea and vomiting)     Current Outpatient Medications on File Prior to Visit  Medication Sig Dispense Refill  . ACCU-CHEK AVIVA PLUS test strip     . acetaminophen (TYLENOL) 500 MG tablet Take 500 mg by mouth every 6 (six) hours as needed for moderate pain.    Marland Kitchen amLODipine (NORVASC) 2.5 MG tablet Take 1 tablet (2.5 mg total) by mouth daily. 90 tablet 2  . Blood Glucose Monitoring Suppl (ACCU-CHEK AVIVA) device Use as instructed 1 each 0  . ferrous sulfate 325 (65 FE) MG EC tablet Take 1 tablet (325 mg total) by mouth 2 (two) times daily with a meal. (Patient taking differently: Take 325 mg by mouth daily.) 90 tablet 1  . glipiZIDE (GLUCOTROL XL) 5 MG 24 hr tablet TAKE (1) TABLET DAILY WITH BREAKFAST. 90 tablet 0  . levothyroxine (SYNTHROID) 88 MCG tablet Take 1 tablet (88 mcg total) by mouth daily. 90 tablet 2  . loperamide  (IMODIUM) 2 MG capsule Take 2 capsules by mouth once. Then take additional 58m capsule as needed for continued diarrhea. No more than 16 mg in 24 hours. 30 capsule 0  . LORazepam (ATIVAN) 0.5 MG tablet TAKE 1 TABLET TWICE DAILY AS NEEDED FOR ANXIETY 30 tablet 0  . simethicone (GAS-X) 80 MG chewable tablet Chew 2 tablets (160 mg total) by mouth 4 (four) times daily for 5 days.  0  . sucralfate (CARAFATE) 1 GM/10ML suspension Take 10 mLs (1 g total) by mouth 4 (four) times daily. 2400 mL 0  . telmisartan (MICARDIS) 40 MG tablet Take 0.5 tablets (20 mg total) by mouth in the morning and at bedtime.     No current facility-administered medications on file prior to visit.    Allergies  Allergen Reactions  . Aspirin Other (See Comments)    Runny nose   . Codeine Nausea And Vomiting    Family History  Problem Relation Age of Onset  . Diabetes Mother   . Hypertension Other   . COPD Father   . Hypertension Sister   . GER disease Sister   . Heart attack Brother   . COPD Brother   . Lung cancer Brother   . COPD Brother   . Hypertension Brother     Social History   Socioeconomic History  .  Marital status: Married    Spouse name: Not on file  . Number of children: 2  . Years of education: Not on file  . Highest education level: Not on file  Occupational History  . Occupation: Retired  Tobacco Use  . Smoking status: Never Smoker  . Smokeless tobacco: Never Used  Vaping Use  . Vaping Use: Never used  Substance and Sexual Activity  . Alcohol use: No  . Drug use: No  . Sexual activity: Never  Other Topics Concern  . Not on file  Social History Narrative   03/03/15-Married, husband Jeneen Rinks   #2 grown sons and #2 grand daughters   Social Determinants of Health   Financial Resource Strain: Low Risk   . Difficulty of Paying Living Expenses: Not hard at all  Food Insecurity: No Food Insecurity  . Worried About Charity fundraiser in the Last Year: Never true  . Ran Out of Food in  the Last Year: Never true  Transportation Needs: No Transportation Needs  . Lack of Transportation (Medical): No  . Lack of Transportation (Non-Medical): No  Physical Activity: Inactive  . Days of Exercise per Week: 0 days  . Minutes of Exercise per Session: 0 min  Stress: No Stress Concern Present  . Feeling of Stress : Not at all  Social Connections: Socially Integrated  . Frequency of Communication with Friends and Family: More than three times a week  . Frequency of Social Gatherings with Friends and Family: More than three times a week  . Attends Religious Services: More than 4 times per year  . Active Member of Clubs or Organizations: Yes  . Attends Archivist Meetings: More than 4 times per year  . Marital Status: Married    Review of Systems - See HPI.  All other ROS are negative.  BP 138/68   Pulse 67   Temp 98.3 F (36.8 C) (Temporal)   Resp 16   Ht _0  (1.702 m)   Wt 217 lb (98.4 kg)   SpO2 99%   BMI 33.99 kg/m   Physical Exam Vitals reviewed.  Constitutional:      Appearance: Normal appearance.  HENT:     Head: Normocephalic and atraumatic.  Cardiovascular:     Rate and Rhythm: Normal rate and regular rhythm.     Pulses: Normal pulses.     Heart sounds: Normal heart sounds.  Pulmonary:     Effort: Pulmonary effort is normal.     Breath sounds: Normal breath sounds and air entry. No decreased breath sounds.  Musculoskeletal:     Cervical back: Neck supple.     Right lower leg: 2+ Edema present.     Left lower leg: 2+ Edema present.  Neurological:     General: No focal deficit present.     Mental Status: She is alert and oriented to person, place, and time.  Psychiatric:        Mood and Affect: Mood normal.     Recent Results (from the past 2160 hour(s))  Protime-INR     Status: Abnormal   Collection Time: 05/25/20  4:35 PM  Result Value Ref Range   INR 3.0 (H) 0.8 - 1.0 ratio   Prothrombin Time 33.2 (H) 9.6 - 13.1 sec  CBC with  Differential/Platelet     Status: Abnormal   Collection Time: 05/25/20  4:35 PM  Result Value Ref Range   WBC 6.9 4.0 - 10.5 K/uL   RBC 3.97 3.87 -  5.11 Mil/uL   Hemoglobin 11.6 (L) 12.0 - 15.0 g/dL   HCT 35.5 (L) 36.0 - 46.0 %   MCV 89.5 78.0 - 100.0 fl   MCHC 32.6 30.0 - 36.0 g/dL   RDW 16.1 (H) 11.5 - 15.5 %   Platelets 130.0 (L) 150.0 - 400.0 K/uL   Neutrophils Relative % 58.1 43.0 - 77.0 %   Lymphocytes Relative 35.1 12.0 - 46.0 %   Monocytes Relative 5.1 3.0 - 12.0 %   Eosinophils Relative 0.9 0.0 - 5.0 %   Basophils Relative 0.8 0.0 - 3.0 %   Neutro Abs 4.0 1.4 - 7.7 K/uL   Lymphs Abs 2.4 0.7 - 4.0 K/uL   Monocytes Absolute 0.4 0.1 - 1.0 K/uL   Eosinophils Absolute 0.1 0.0 - 0.7 K/uL   Basophils Absolute 0.1 0.0 - 0.1 K/uL  Comprehensive metabolic panel     Status: Abnormal   Collection Time: 05/25/20  4:35 PM  Result Value Ref Range   Sodium 141 135 - 145 mEq/L   Potassium 4.4 3.5 - 5.1 mEq/L   Chloride 106 96 - 112 mEq/L   CO2 28 19 - 32 mEq/L   Glucose, Bld 92 70 - 99 mg/dL   BUN 14 6 - 23 mg/dL   Creatinine, Ser 1.19 0.40 - 1.20 mg/dL   Total Bilirubin 0.9 0.2 - 1.2 mg/dL   Alkaline Phosphatase 110 39 - 117 U/L   AST 27 0 - 37 U/L   ALT 15 0 - 35 U/L   Total Protein 6.2 6.0 - 8.3 g/dL   Albumin 3.3 (L) 3.5 - 5.2 g/dL   GFR 43.84 (L) >60.00 mL/min    Comment: Calculated using the CKD-EPI Creatinine Equation (2021)   Calcium 8.9 8.4 - 10.5 mg/dL  CBC w/Diff     Status: Abnormal   Collection Time: 06/06/20 11:39 AM  Result Value Ref Range   WBC 4.3 4.0 - 10.5 K/uL   RBC 3.13 (L) 3.87 - 5.11 Mil/uL   Hemoglobin 9.3 (L) 12.0 - 15.0 g/dL   HCT 28.6 (L) 36.0 - 46.0 %   MCV 91.3 78.0 - 100.0 fl   MCHC 32.5 30.0 - 36.0 g/dL   RDW 17.4 (H) 11.5 - 15.5 %   Platelets 121.0 (L) 150.0 - 400.0 K/uL   Neutrophils Relative % 57.0 43.0 - 77.0 %   Lymphocytes Relative 35.4 12.0 - 46.0 %   Monocytes Relative 6.1 3.0 - 12.0 %   Eosinophils Relative 1.0 0.0 - 5.0 %    Basophils Relative 0.5 0.0 - 3.0 %   Neutro Abs 2.4 1.4 - 7.7 K/uL   Lymphs Abs 1.5 0.7 - 4.0 K/uL   Monocytes Absolute 0.3 0.1 - 1.0 K/uL   Eosinophils Absolute 0.0 0.0 - 0.7 K/uL   Basophils Absolute 0.0 0.0 - 0.1 K/uL  Basic metabolic panel     Status: Abnormal   Collection Time: 06/06/20 11:39 AM  Result Value Ref Range   Sodium 139 135 - 145 mEq/L   Potassium 4.5 3.5 - 5.1 mEq/L   Chloride 104 96 - 112 mEq/L   CO2 28 19 - 32 mEq/L   Glucose, Bld 184 (H) 70 - 99 mg/dL   BUN 19 6 - 23 mg/dL   Creatinine, Ser 1.60 (H) 0.40 - 1.20 mg/dL   GFR 30.73 (L) >60.00 mL/min    Comment: Calculated using the CKD-EPI Creatinine Equation (2021)   Calcium 8.5 8.4 - 10.5 mg/dL  CBC w/Diff  Status: Abnormal   Collection Time: 06/14/20  1:20 PM  Result Value Ref Range   WBC 4.5 4.0 - 10.5 K/uL   RBC 2.97 (L) 3.87 - 5.11 Mil/uL   Hemoglobin 8.6 Repeated and verified X2. (L) 12.0 - 15.0 g/dL   HCT 27.4 (L) 36.0 - 46.0 %   MCV 92.4 78.0 - 100.0 fl   MCHC 31.5 30.0 - 36.0 g/dL   RDW 18.6 (H) 11.5 - 15.5 %   Platelets 119.0 (L) 150.0 - 400.0 K/uL   Neutrophils Relative % 50.7 43.0 - 77.0 %   Lymphocytes Relative 41.8 12.0 - 46.0 %   Monocytes Relative 6.5 3.0 - 12.0 %   Eosinophils Relative 0.5 0.0 - 5.0 %   Basophils Relative 0.5 0.0 - 3.0 %   Neutro Abs 2.3 1.4 - 7.7 K/uL   Lymphs Abs 1.9 0.7 - 4.0 K/uL   Monocytes Absolute 0.3 0.1 - 1.0 K/uL   Eosinophils Absolute 0.0 0.0 - 0.7 K/uL   Basophils Absolute 0.0 0.0 - 0.1 K/uL  CBC with Differential     Status: Abnormal   Collection Time: 06/15/20  9:23 AM  Result Value Ref Range   WBC 4.8 4.0 - 10.5 K/uL   RBC 2.90 (L) 3.87 - 5.11 MIL/uL   Hemoglobin 8.4 (L) 12.0 - 15.0 g/dL   HCT 28.6 (L) 36.0 - 46.0 %   MCV 98.6 80.0 - 100.0 fL   MCH 29.0 26.0 - 34.0 pg   MCHC 29.4 (L) 30.0 - 36.0 g/dL   RDW 17.2 (H) 11.5 - 15.5 %   Platelets 127 (L) 150 - 400 K/uL   nRBC 0.0 0.0 - 0.2 %   Neutrophils Relative % 62 %   Neutro Abs 3.0 1.7 - 7.7  K/uL   Lymphocytes Relative 31 %   Lymphs Abs 1.5 0.7 - 4.0 K/uL   Monocytes Relative 6 %   Monocytes Absolute 0.3 0.1 - 1.0 K/uL   Eosinophils Relative 1 %   Eosinophils Absolute 0.0 0.0 - 0.5 K/uL   Basophils Relative 0 %   Basophils Absolute 0.0 0.0 - 0.1 K/uL   Immature Granulocytes 0 %   Abs Immature Granulocytes 0.01 0.00 - 0.07 K/uL    Comment: Performed at [facility], [place] [address], [place], [place] [address]  Comprehensive metabolic panel     Status: Abnormal   Collection Time: 06/15/20  9:23 AM  Result Value Ref Range   Sodium 142 135 - 145 mmol/L   Potassium 4.7 3.5 - 5.1 mmol/L   Chloride 110 98 - 111 mmol/L   CO2 23 22 - 32 mmol/L   Glucose, Bld 169 (H) 70 - 99 mg/dL    Comment: Glucose reference range applies only to samples taken after fasting for at least 8 hours.   BUN 21 8 - 23 mg/dL   Creatinine, Ser 1.31 (H) 0.44 - 1.00 mg/dL   Calcium 8.8 (L) 8.9 - 10.3 mg/dL   Total Protein 6.2 (L) 6.5 - 8.1 g/dL   Albumin 3.2 (L) 3.5 - 5.0 g/dL   AST 34 15 - 41 U/L   ALT 15 0 - 44 U/L   Alkaline Phosphatase 72 38 - 126 U/L   Total Bilirubin 1.0 0.3 - 1.2 mg/dL   GFR, Estimated 42 (L) >60 mL/min    Comment: (NOTE) Calculated using the CKD-EPI Creatinine Equation (2021)    Anion gap 9 5 - 15    Comment: Performed at [facility], [place] [address], [place], [place] [address]  Protime-INR     Status: Abnormal   Collection Time: 06/15/20  9:23 AM  Result Value Ref Range   Prothrombin Time 15.4 (H) 11.4 - 15.2 seconds   INR 1.3 (H) 0.8 - 1.2    Comment: (NOTE) INR goal varies based on device and disease states. Performed at [facility], [place] [address], [place], [place] [address]   Type and screen     Status: None   Collection Time: 06/15/20  9:23 AM  Result Value Ref Range   ABO/RH(D) A POS    Antibody Screen NEG    Sample Expiration 06/18/2020,2359    Unit Number [id number]    Blood Component Type RED  CELLS,LR    Unit division 00    Status of Unit ISSUED,FINAL    Transfusion Status OK TO TRANSFUSE    Crossmatch Result      Compatible Performed at [place] [address] [place], [place] [address]   Prepare RBC (crossmatch)     Status: None   Collection Time: 06/15/20  9:23 AM  Result Value Ref Range   Order Confirmation      ORDER PROCESSED BY BLOOD BANK Performed at [facility], [place] [address] [place], [place] [address]   Troponin I (High Sensitivity)     Status: None   Collection Time: 06/15/20  9:23 AM  Result Value Ref Range   Troponin I (High Sensitivity) 4 <18 ng/L    Comment: (NOTE) Elevated high sensitivity troponin I (hsTnI) values and significant  changes across serial measurements may suggest ACS but many other  chronic and acute conditions are known to elevate hsTnI results.  Refer to the "Links" section for chest pain algorithms and additional  guidance. Performed at [facility], [place] [address], [place], [place] [address]   BPAM RBC     Status: None   Collection Time: 06/15/20  9:23 AM  Result Value Ref Range   ISSUE DATE / TIME [id number]    Blood Product Unit Number [id number]    PRODUCT CODE [id number]    Unit Type and Rh 6200    Blood Product Expiration Date 093235573220   AFP tumor marker     Status: None   Collection Time: 06/15/20  9:23 AM  Result Value Ref Range   AFP, Serum, Tumor Marker 3.6 0.0 - 8.3 ng/mL    Comment: (NOTE) Roche Diagnostics Electrochemiluminescence Immunoassay (ECLIA) Values obtained with different assay methods or kits cannot be used interchangeably.  Results cannot be interpreted as absolute evidence of the presence or absence of malignant disease. This test is not interpretable in pregnant females. Performed At: [facility] [place], [place] [id number] Rush Farmer MD JS:[id number]   Resp Panel by RT-PCR (Flu A&B, Covid)  Nasopharyngeal Swab     Status: None   Collection Time: 06/15/20 10:30 AM   Specimen: Nasopharyngeal Swab; Nasopharyngeal(NP) swabs in vial transport medium  Result Value Ref Range   SARS Coronavirus 2 by RT PCR NEGATIVE NEGATIVE    Comment: (NOTE) SARS-CoV-2 target nucleic acids are NOT DETECTED.  The SARS-CoV-2 RNA is generally detectable in upper respiratory specimens during the acute phase of infection. The lowest concentration of SARS-CoV-2 viral copies this assay can detect is 138 copies/mL. A negative result does not preclude SARS-Cov-2 infection and should not be used as the sole basis for treatment or other patient management decisions. A negative result  may occur with  improper specimen collection/handling, submission of specimen other than nasopharyngeal swab, presence of viral mutation(s) within the areas targeted by this assay, and inadequate number of viral copies(<138 copies/mL). A negative result must be combined with clinical observations, patient history, and epidemiological information. The expected result is Negative.  Fact Sheet for Patients:  EntrepreneurPulse.com.au  Fact Sheet for Healthcare Providers:  IncredibleEmployment.be  This test is no t yet approved or cleared by the [place] FDA and  has been authorized for detection and/or diagnosis of SARS-CoV-2 by FDA under an Emergency Use Authorization (EUA). This EUA will remain  in effect (meaning this test can be used) for the duration of the COVID-19 declaration under Section 564(b)(1) of the Act, 21 U.S.C.section 360bbb-3(b)(1), unless the authorization is terminated  or revoked sooner.       Influenza A by PCR NEGATIVE NEGATIVE   Influenza B by PCR NEGATIVE NEGATIVE    Comment: (NOTE) The Xpert Xpress SARS-CoV-2/FLU/RSV plus assay is intended as an aid in the diagnosis of influenza from Nasopharyngeal swab specimens and should not be used as a sole basis for  treatment. Nasal washings and aspirates are unacceptable for Xpert Xpress SARS-CoV-2/FLU/RSV testing.  Fact Sheet for Patients: EntrepreneurPulse.com.au  Fact Sheet for Healthcare Providers: IncredibleEmployment.be  This test is not yet approved or cleared by the [place] FDA and has been authorized for detection and/or diagnosis of SARS-CoV-2 by FDA under an Emergency Use Authorization (EUA). This EUA will remain in effect (meaning this test can be used) for the duration of the COVID-19 declaration under Section 564(b)(1) of the Act, 21 U.S.C. section 360bbb-3(b)(1), unless the authorization is terminated or revoked.  Performed at [facility], [place] [address] [place], [place] [address]   HEMOGLOBIN AND HEMATOCRIT, BLOOD     Status: Abnormal   Collection Time: 06/15/20  3:58 PM  Result Value Ref Range   Hemoglobin 9.4 (L) 12.0 - 15.0 g/dL   HCT 30.7 (L) 36.0 - 46.0 %    Comment: Performed at [facility], [place] [address], [place], [place] [address]  Glucose, capillary     Status: Abnormal   Collection Time: 06/15/20  6:42 PM  Result Value Ref Range   Glucose-Capillary 111 (H) 70 - 99 mg/dL    Comment: Glucose reference range applies only to samples taken after fasting for at least 8 hours.  Glucose, capillary     Status: Abnormal   Collection Time: 06/15/20  9:59 PM  Result Value Ref Range   Glucose-Capillary 101 (H) 70 - 99 mg/dL    Comment: Glucose reference range applies only to samples taken after fasting for at least 8 hours.  HEMOGLOBIN AND HEMATOCRIT, BLOOD     Status: Abnormal   Collection Time: 06/15/20 10:04 PM  Result Value Ref Range   Hemoglobin 8.6 (L) 12.0 - 15.0 g/dL   HCT 27.9 (L) 36.0 - 46.0 %    Comment: Performed at [facility], [place] [address], [place], [place] [address]  HEMOGLOBIN AND HEMATOCRIT, BLOOD     Status: Abnormal   Collection Time: 06/16/20  4:19  AM  Result Value Ref Range   Hemoglobin 9.0 (L) 12.0 - 15.0 g/dL   HCT 29.7 (L) 36.0 - 46.0 %    Comment: Performed at [facility], [place] [address] [place], [place] [address]  Comprehensive metabolic panel     Status: Abnormal   Collection Time: 06/16/20  4:19 AM  Result Value  Ref Range   Sodium 140 135 - 145 mmol/L   Potassium 4.1 3.5 - 5.1 mmol/L   Chloride 111 98 - 111 mmol/L   CO2 21 (L) 22 - 32 mmol/L   Glucose, Bld 100 (H) 70 - 99 mg/dL    Comment: Glucose reference range applies only to samples taken after fasting for at least 8 hours.   BUN 16 8 - 23 mg/dL   Creatinine, Ser 1.15 (H) 0.44 - 1.00 mg/dL   Calcium 8.7 (L) 8.9 - 10.3 mg/dL   Total Protein 5.4 (L) 6.5 - 8.1 g/dL   Albumin 3.0 (L) 3.5 - 5.0 g/dL   AST 23 15 - 41 U/L   ALT 13 0 - 44 U/L   Alkaline Phosphatase 68 38 - 126 U/L   Total Bilirubin 1.2 0.3 - 1.2 mg/dL   GFR, Estimated 49 (L) >60 mL/min    Comment: (NOTE) Calculated using the CKD-EPI Creatinine Equation (2021)    Anion gap 8 5 - 15    Comment: Performed at [facility], [place] [address], [place], [place] [address]  Glucose, capillary     Status: Abnormal   Collection Time: 06/16/20  7:31 AM  Result Value Ref Range   Glucose-Capillary 118 (H) 70 - 99 mg/dL    Comment: Glucose reference range applies only to samples taken after fasting for at least 8 hours.  HEMOGLOBIN AND HEMATOCRIT, BLOOD     Status: Abnormal   Collection Time: 06/16/20  9:51 AM  Result Value Ref Range   Hemoglobin 9.5 (L) 12.0 - 15.0 g/dL   HCT 31.8 (L) 36.0 - 46.0 %    Comment: Performed at [facility], [place] [address] [place], [place] [address]  Glucose, capillary     Status: Abnormal   Collection Time: 06/16/20 11:38 AM  Result Value Ref Range   Glucose-Capillary 131 (H) 70 - 99 mg/dL    Comment: Glucose reference range applies only to samples taken after fasting for at least 8 hours.  HEMOGLOBIN AND HEMATOCRIT, BLOOD      Status: Abnormal   Collection Time: 06/16/20  4:52 PM  Result Value Ref Range   Hemoglobin 9.8 (L) 12.0 - 15.0 g/dL   HCT 33.3 (L) 36.0 - 46.0 %    Comment: Performed at [facility], [place] [address], [place], [place] [address]  Glucose, capillary     Status: Abnormal   Collection Time: 06/16/20  5:25 PM  Result Value Ref Range   Glucose-Capillary 123 (H) 70 - 99 mg/dL    Comment: Glucose reference range applies only to samples taken after fasting for at least 8 hours.  Glucose, capillary     Status: Abnormal   Collection Time: 06/16/20  9:24 PM  Result Value Ref Range   Glucose-Capillary 125 (H) 70 - 99 mg/dL    Comment: Glucose reference range applies only to samples taken after fasting for at least 8 hours.  HEMOGLOBIN AND HEMATOCRIT, BLOOD     Status: Abnormal   Collection Time: 06/16/20 10:56 PM  Result Value Ref Range   Hemoglobin 9.2 (L) 12.0 - 15.0 g/dL    Comment: REPEATED TO VERIFY   HCT 29.4 (L) 36.0 - 46.0 %    Comment: Performed at [facility], [place] [address], [place], [place] [address]  HEMOGLOBIN AND HEMATOCRIT, BLOOD     Status: Abnormal   Collection Time: 06/17/20  5:52 AM  Result Value Ref Range   Hemoglobin  8.8 (L) 12.0 - 15.0 g/dL   HCT 28.4 (L) 36.0 - 46.0 %    Comment: Performed at [facility], [place] [address] [place], [place] [address]  CBC with Differential/Platelet     Status: Abnormal   Collection Time: 06/17/20  5:52 AM  Result Value Ref Range   WBC 4.0 4.0 - 10.5 K/uL   RBC 3.00 (L) 3.87 - 5.11 MIL/uL   Hemoglobin 8.8 (L) 12.0 - 15.0 g/dL   HCT 29.4 (L) 36.0 - 46.0 %   MCV 98.0 80.0 - 100.0 fL   MCH 29.3 26.0 - 34.0 pg   MCHC 29.9 (L) 30.0 - 36.0 g/dL   RDW 17.0 (H) 11.5 - 15.5 %   Platelets 106 (L) 150 - 400 K/uL    Comment: Immature Platelet Fraction may be clinically indicated, consider ordering this additional test [id number]    nRBC 0.0 0.0 - 0.2 %   Neutrophils Relative % 56 %    Neutro Abs 2.3 1.7 - 7.7 K/uL   Lymphocytes Relative 36 %   Lymphs Abs 1.5 0.7 - 4.0 K/uL   Monocytes Relative 6 %   Monocytes Absolute 0.2 0.1 - 1.0 K/uL   Eosinophils Relative 1 %   Eosinophils Absolute 0.0 0.0 - 0.5 K/uL   Basophils Relative 1 %   Basophils Absolute 0.0 0.0 - 0.1 K/uL   Immature Granulocytes 0 %   Abs Immature Granulocytes 0.01 0.00 - 0.07 K/uL    Comment: Performed at [facility], [place] [address], [place], [place] [address]  Comprehensive metabolic panel     Status: Abnormal   Collection Time: 06/17/20  5:52 AM  Result Value Ref Range   Sodium 139 135 - 145 mmol/L   Potassium 4.3 3.5 - 5.1 mmol/L   Chloride 110 98 - 111 mmol/L   CO2 19 (L) 22 - 32 mmol/L   Glucose, Bld 131 (H) 70 - 99 mg/dL    Comment: Glucose reference range applies only to samples taken after fasting for at least 8 hours.   BUN 12 8 - 23 mg/dL   Creatinine, Ser 1.20 (H) 0.44 - 1.00 mg/dL   Calcium 8.7 (L) 8.9 - 10.3 mg/dL   Total Protein 5.3 (L) 6.5 - 8.1 g/dL   Albumin 2.8 (L) 3.5 - 5.0 g/dL   AST 26 15 - 41 U/L   ALT 15 0 - 44 U/L   Alkaline Phosphatase 67 38 - 126 U/L   Total Bilirubin 0.9 0.3 - 1.2 mg/dL   GFR, Estimated 46 (L) >60 mL/min    Comment: (NOTE) Calculated using the CKD-EPI Creatinine Equation (2021)    Anion gap 10 5 - 15    Comment: Performed at [facility], [place] [address], [place], [place] [address]  Magnesium     Status: None   Collection Time: 06/17/20  5:52 AM  Result Value Ref Range   Magnesium 1.8 1.7 - 2.4 mg/dL    Comment: Performed at [facility], [place] [address] [place], [place] [address]  Phosphorus     Status: None   Collection Time: 06/17/20  5:52 AM  Result Value Ref Range   Phosphorus 3.6 2.5 - 4.6 mg/dL    Comment: Performed at [facility], [place] [address] [place], [place] [address]  Glucose, capillary     Status: Abnormal   Collection Time: 06/17/20  7:30 AM   Result Value Ref Range   Glucose-Capillary 129 (H)  70 - 99 mg/dL    Comment: Glucose reference range applies only to samples taken after fasting for at least 8 hours.  HEMOGLOBIN AND HEMATOCRIT, BLOOD     Status: Abnormal   Collection Time: 06/17/20 10:29 AM  Result Value Ref Range   Hemoglobin 9.0 (L) 12.0 - 15.0 g/dL   HCT 29.9 (L) 36.0 - 46.0 %    Comment: Performed at [facility], [place] [address], [place], [place] [address]  Surgical pathology     Status: None   Collection Time: 06/17/20  1:21 PM  Result Value Ref Range   SURGICAL PATHOLOGY      SURGICAL PATHOLOGY CASE: [id number] PATIENT: [facility] Surgical Pathology Report     Clinical History: Anemia, hemoccult positive stool, rectal bleeding (crm)     FINAL MICROSCOPIC DIAGNOSIS:  A. DUODENUM, BIOPSY: - Unremarkable duodenal mucosa. - No features of celiac sprue or granulomas.  B. STOMACH, BIOPSY: - Unremarkable gastric mucosa. - Warthin-Starry negative for Helicobacter pylori. - No intestinal metaplasia, dysplasia or carcinoma.  C. COLON, TRANSVERSE, DESCENDING, SIGMOID, RECTUM, POLYPECTOMY: - Tubular adenoma (1). - No high-grade dysplasia or carcinoma. - Hyperplastic polyp (s).  D. RECTUM, BIOPSY: - Unremarkable colonic mucosa with benign lymphoid aggregates. - No active inflammation, chronic changes or granulomas.    GROSS DESCRIPTION:  A: Received in formalin are tan, soft tissue fragments that are submitted in toto. Number: 4 size: Range from 0.1 to 0.6 cm blocks: 1  B: Received in formalin is a tan, soft tissue fragment tha t is submitted in toto.  Size: 0.4 cm, 1 block submitted.  C: Received in formalin are tan, soft tissue fragments that are submitted in toto. Number: Multiple size: Range from 0.3 to 1.2 x 0.4 x 0.2 cm blocks: 1  D: Received in formalin are tan, soft tissue fragments that are submitted in toto. Number: Multiple size: Range from 0.1 to 0.4  cm blocks: 1 Craig Staggers 06/17/2020)    Final Diagnosis performed by Claudette Laws, MD.   Electronically signed 06/20/2020 Technical and / or Professional components performed at [facility], [place] [address], [place], [place] [address].  Immunohistochemistry Technical component (if applicable) was performed at [facility]. [address], [place], [place], [place] [address].   IMMUNOHISTOCHEMISTRY DISCLAIMER (if applicable): Some of these immunohistochemical stains may have been developed and the performance characteristics determine by [facility]. Some may not have been cleared or approve d by the U.S. Food and Drug Administration. The FDA has determined that such clearance or approval is not necessary. This test is used for clinical purposes. It should not be regarded as investigational or for research. This laboratory is certified under the [place] (CLIA-88) as qualified to perform high complexity clinical laboratory testing.  The controls stained appropriately.   HEMOGLOBIN AND HEMATOCRIT, BLOOD     Status: Abnormal   Collection Time: 06/17/20  4:34 PM  Result Value Ref Range   Hemoglobin 9.5 (L) 12.0 - 15.0 g/dL   HCT 31.4 (L) 36.0 - 46.0 %    Comment: Performed at [facility], [place] [address] [place], [place] [address]  Glucose, capillary     Status: Abnormal   Collection Time: 06/17/20  5:51 PM  Result Value Ref Range   Glucose-Capillary 105 (H) 70 - 99 mg/dL    Comment: Glucose reference range applies only to samples taken after fasting for at least 8 hours.   Comment 1  Notify RN    Comment 2 Document in Chart   Glucose, capillary     Status: Abnormal   Collection Time: 06/17/20  9:09 PM  Result Value Ref Range   Glucose-Capillary 129 (H) 70 - 99 mg/dL    Comment: Glucose reference range applies only to samples taken after fasting for at least 8 hours.  HEMOGLOBIN  AND HEMATOCRIT, BLOOD     Status: Abnormal   Collection Time: 06/17/20 10:23 PM  Result Value Ref Range   Hemoglobin 9.9 (L) 12.0 - 15.0 g/dL   HCT 32.5 (L) 36.0 - 46.0 %    Comment: Performed at [facility], [place] [address], [place], [place] [address]  Glucose, capillary     Status: Abnormal   Collection Time: 06/18/20 12:23 AM  Result Value Ref Range   Glucose-Capillary 131 (H) 70 - 99 mg/dL    Comment: Glucose reference range applies only to samples taken after fasting for at least 8 hours.  Glucose, capillary     Status: Abnormal   Collection Time: 06/18/20  4:16 AM  Result Value Ref Range   Glucose-Capillary 126 (H) 70 - 99 mg/dL    Comment: Glucose reference range applies only to samples taken after fasting for at least 8 hours.  Glucose, capillary     Status: Abnormal   Collection Time: 06/18/20  7:50 AM  Result Value Ref Range   Glucose-Capillary 119 (H) 70 - 99 mg/dL    Comment: Glucose reference range applies only to samples taken after fasting for at least 8 hours.  CBC with Differential/Platelet     Status: Abnormal   Collection Time: 06/18/20  7:58 AM  Result Value Ref Range   WBC 4.0 4.0 - 10.5 K/uL   RBC 2.98 (L) 3.87 - 5.11 MIL/uL   Hemoglobin 9.0 (L) 12.0 - 15.0 g/dL   HCT 29.3 (L) 36.0 - 46.0 %   MCV 98.3 80.0 - 100.0 fL   MCH 30.2 26.0 - 34.0 pg   MCHC 30.7 30.0 - 36.0 g/dL   RDW 16.9 (H) 11.5 - 15.5 %   Platelets 101 (L) 150 - 400 K/uL    Comment: REPEATED TO VERIFY PLATELET COUNT CONFIRMED BY SMEAR SPECIMEN CHECKED FOR CLOTS Immature Platelet Fraction may be clinically indicated, consider ordering this additional test [id number]    nRBC 0.0 0.0 - 0.2 %   Neutrophils Relative % 58 %   Neutro Abs 2.4 1.7 - 7.7 K/uL   Lymphocytes Relative 34 %   Lymphs Abs 1.4 0.7 - 4.0 K/uL   Monocytes Relative 5 %   Monocytes Absolute 0.2 0.1 - 1.0 K/uL   Eosinophils Relative 2 %   Eosinophils Absolute 0.1 0.0 - 0.5 K/uL   Basophils Relative 1 %    Basophils Absolute 0.0 0.0 - 0.1 K/uL   Immature Granulocytes 0 %   Abs Immature Granulocytes 0.00 0.00 - 0.07 K/uL   Tear Drop Cells PRESENT     Comment: Performed at [facility], [place] [address], [place], [place] [address]  Comprehensive metabolic panel     Status: Abnormal   Collection Time: 06/18/20  7:58 AM  Result Value Ref Range   Sodium 137 135 - 145 mmol/L   Potassium 4.0 3.5 - 5.1 mmol/L   Chloride 106 98 - 111 mmol/L   CO2 19 (L) 22 - 32 mmol/L   Glucose, Bld 133 (H) 70 - 99 mg/dL    Comment: Glucose reference range applies only to  samples taken after fasting for at least 8 hours.   BUN 12 8 - 23 mg/dL   Creatinine, Ser 1.13 (H) 0.44 - 1.00 mg/dL   Calcium 8.4 (L) 8.9 - 10.3 mg/dL   Total Protein 5.5 (L) 6.5 - 8.1 g/dL   Albumin 2.9 (L) 3.5 - 5.0 g/dL   AST 22 15 - 41 U/L   ALT 13 0 - 44 U/L   Alkaline Phosphatase 76 38 - 126 U/L   Total Bilirubin 0.7 0.3 - 1.2 mg/dL   GFR, Estimated 50 (L) >60 mL/min    Comment: (NOTE) Calculated using the CKD-EPI Creatinine Equation (2021)    Anion gap 12 5 - 15    Comment: Performed at [facility], [place] [address] [place], [place] [address]  Magnesium     Status: None   Collection Time: 06/18/20  7:58 AM  Result Value Ref Range   Magnesium 1.8 1.7 - 2.4 mg/dL    Comment: Performed at [facility], [place] [address], [place], [place] [address]  Phosphorus     Status: None   Collection Time: 06/18/20  7:58 AM  Result Value Ref Range   Phosphorus 3.4 2.5 - 4.6 mg/dL    Comment: Performed at [facility], [place] [address] [place], [place] [address]  Glucose, capillary     Status: Abnormal   Collection Time: 06/18/20 12:07 PM  Result Value Ref Range   Glucose-Capillary 137 (H) 70 - 99 mg/dL    Comment: Glucose reference range applies only to samples taken after fasting for at least 8 hours.  CBC w/Diff     Status: Abnormal   Collection Time: 06/23/20  11:16 AM  Result Value Ref Range   WBC 4.1 4.0 - 10.5 K/uL   RBC 3.18 (L) 3.87 - 5.11 Mil/uL   Hemoglobin 9.6 (L) 12.0 - 15.0 g/dL   HCT 29.8 (L) 36.0 - 46.0 %   MCV 93.8 78.0 - 100.0 fl   MCHC 32.1 30.0 - 36.0 g/dL   RDW 17.9 (H) 11.5 - 15.5 %   Platelets 112.0 (L) 150.0 - 400.0 K/uL   Neutrophils Relative % 61.6 43.0 - 77.0 %   Lymphocytes Relative 31.1 12.0 - 46.0 %   Monocytes Relative 5.1 3.0 - 12.0 %   Eosinophils Relative 1.5 0.0 - 5.0 %   Basophils Relative 0.7 0.0 - 3.0 %   Neutro Abs 2.5 1.4 - 7.7 K/uL   Lymphs Abs 1.3 0.7 - 4.0 K/uL   Monocytes Absolute 0.2 0.1 - 1.0 K/uL   Eosinophils Absolute 0.1 0.0 - 0.7 K/uL   Basophils Absolute 0.0 0.0 - 0.1 K/uL  Comp Met (CMET)     Status: Abnormal   Collection Time: 06/23/20 11:16 AM  Result Value Ref Range   Sodium 139 135 - 145 mEq/L   Potassium 4.5 3.5 - 5.1 mEq/L   Chloride 109 96 - 112 mEq/L   CO2 25 19 - 32 mEq/L   Glucose, Bld 175 (H) 70 - 99 mg/dL   BUN 20 6 - 23 mg/dL   Creatinine, Ser 1.25 (H) 0.40 - 1.20 mg/dL   Total Bilirubin 0.6 0.2 - 1.2 mg/dL   Alkaline Phosphatase 72 39 - 117 U/L   AST 27 0 - 37 U/L   ALT 12 0 - 35 U/L   Total Protein 5.7 (L) 6.0 - 8.3 g/dL   Albumin 3.3 (L) 3.5 - 5.2 g/dL   GFR 41.31 (L) >60.00 mL/min  Comment: Calculated using the CKD-EPI Creatinine Equation (2021)   Calcium 8.5 8.4 - 10.5 mg/dL  CEA (IN HOUSE-[facility])     Status: None   Collection Time: 06/30/20  9:52 AM  Result Value Ref Range   CEA ([facility]-In House) 3.24 0.00 - 5.00 ng/mL    Comment: (NOTE) This test was performed using [employer] Chemiluminescent Microparticle Immunoassay. Values obtained from different assay methods cannot be used interchangeably. Please note that 5-10% of patients who smoke may see CEA levels up to 6.9 ng/mL. Performed at [facility], [place] [address] [place], [place] [address]   Sample to Blood Bank     Status: None   Collection Time: 06/30/20 11:49 AM  Result  Value Ref Range   Blood Bank Specimen SAMPLE AVAILABLE FOR TESTING    Sample Expiration      07/03/2020,2359 Performed at [facility], [place] [address], [place], [place] [address]   CBC with Differential ([place] Only)     Status: Abnormal   Collection Time: 06/30/20 11:50 AM  Result Value Ref Range   WBC Count 5.9 4.0 - 10.5 K/uL   RBC 2.03 (L) 3.87 - 5.11 MIL/uL   Hemoglobin 6.4 (LL) 12.0 - 15.0 g/dL    Comment: This critical result has verified and been called to [place], RN by SUTCAVAGE, PAM on 12 16 2021 at 1210, and has been read back.    HCT 21.3 (L) 36.0 - 46.0 %   MCV 104.9 (H) 80.0 - 100.0 fL   MCH 31.5 26.0 - 34.0 pg   MCHC 30.0 30.0 - 36.0 g/dL   RDW 20.6 (H) 11.5 - 15.5 %   Platelet Count 140 (L) 150 - 400 K/uL   nRBC 0.0 0.0 - 0.2 %   Neutrophils Relative % 54 %   Neutro Abs 3.2 1.7 - 7.7 K/uL   Lymphocytes Relative 40 %   Lymphs Abs 2.4 0.7 - 4.0 K/uL   Monocytes Relative 4 %   Monocytes Absolute 0.3 0.1 - 1.0 K/uL   Eosinophils Relative 1 %   Eosinophils Absolute 0.1 0.0 - 0.5 K/uL   Basophils Relative 1 %   Basophils Absolute 0.0 0.0 - 0.1 K/uL   Immature Granulocytes 0 %   Abs Immature Granulocytes 0.02 0.00 - 0.07 K/uL    Comment: Performed at [facility], [place] [address], [place], [place] [address]  Comprehensive metabolic panel     Status: Abnormal   Collection Time: 06/30/20  1:01 PM  Result Value Ref Range   Sodium 140 135 - 145 mmol/L   Potassium 4.3 3.5 - 5.1 mmol/L   Chloride 109 98 - 111 mmol/L   CO2 23 22 - 32 mmol/L   Glucose, Bld 116 (H) 70 - 99 mg/dL    Comment: Glucose reference range applies only to samples taken after fasting for at least 8 hours.   BUN 49 (H) 8 - 23 mg/dL   Creatinine, Ser 1.97 (H) 0.44 - 1.00 mg/dL   Calcium 8.8 (L) 8.9 - 10.3 mg/dL   Total Protein 5.7 (L) 6.5 - 8.1 g/dL   Albumin 2.9 (L) 3.5 - 5.0 g/dL   AST 27 15 - 41 U/L   ALT 17 0 - 44 U/L   Alkaline  Phosphatase 69 38 - 126 U/L   Total Bilirubin 1.0 0.3 - 1.2 mg/dL   GFR, Estimated 26 (L) >60 mL/min    Comment: (NOTE) Calculated using the CKD-EPI Creatinine  Equation (2021)    Anion gap 8 5 - 15    Comment: Performed at [facility], [place] [address], [place], [place] [address]  Protime-INR     Status: Abnormal   Collection Time: 06/30/20  1:01 PM  Result Value Ref Range   Prothrombin Time 22.9 (H) 11.4 - 15.2 seconds   INR 2.1 (H) 0.8 - 1.2    Comment: (NOTE) INR goal varies based on device and disease states. Performed at [facility], [place] [address], [place], [place] [address]   Resp Panel by RT-PCR (Flu A&B, Covid) Nasopharyngeal Swab     Status: None   Collection Time: 06/30/20  1:26 PM   Specimen: Nasopharyngeal Swab; Nasopharyngeal(NP) swabs in vial transport medium  Result Value Ref Range   SARS Coronavirus 2 by RT PCR NEGATIVE NEGATIVE    Comment: (NOTE) SARS-CoV-2 target nucleic acids are NOT DETECTED.  The SARS-CoV-2 RNA is generally detectable in upper respiratory specimens during the acute phase of infection. The lowest concentration of SARS-CoV-2 viral copies this assay can detect is 138 copies/mL. A negative result does not preclude SARS-Cov-2 infection and should not be used as the sole basis for treatment or other patient management decisions. A negative result may occur with  improper specimen collection/handling, submission of specimen other than nasopharyngeal swab, presence of viral mutation(s) within the areas targeted by this assay, and inadequate number of viral copies(<138 copies/mL). A negative result must be combined with clinical observations, patient history, and epidemiological information. The expected result is Negative.  Fact Sheet for Patients:  EntrepreneurPulse.com.au  Fact Sheet for Healthcare Providers:  IncredibleEmployment.be  This test is no t yet approved or  cleared by the [place] FDA and  has been authorized for detection and/or diagnosis of SARS-CoV-2 by FDA under an Emergency Use Authorization (EUA). This EUA will remain  in effect (meaning this test can be used) for the duration of the COVID-19 declaration under Section 564(b)(1) of the Act, 21 U.S.C.section 360bbb-3(b)(1), unless the authorization is terminated  or revoked sooner.       Influenza A by PCR NEGATIVE NEGATIVE   Influenza B by PCR NEGATIVE NEGATIVE    Comment: (NOTE) The Xpert Xpress SARS-CoV-2/FLU/RSV plus assay is intended as an aid in the diagnosis of influenza from Nasopharyngeal swab specimens and should not be used as a sole basis for treatment. Nasal washings and aspirates are unacceptable for Xpert Xpress SARS-CoV-2/FLU/RSV testing.  Fact Sheet for Patients: EntrepreneurPulse.com.au  Fact Sheet for Healthcare Providers: IncredibleEmployment.be  This test is not yet approved or cleared by the [place] FDA and has been authorized for detection and/or diagnosis of SARS-CoV-2 by FDA under an Emergency Use Authorization (EUA). This EUA will remain in effect (meaning this test can be used) for the duration of the COVID-19 declaration under Section 564(b)(1) of the Act, 21 U.S.C. section 360bbb-3(b)(1), unless the authorization is terminated or revoked.  Performed at [facility], [place] [address], [place], [place] [address]   Prepare RBC (crossmatch)     Status: None   Collection Time: 06/30/20  1:26 PM  Result Value Ref Range   Order Confirmation      ORDER PROCESSED BY BLOOD BANK Performed at [place] [address], [place], [place] [address]   Type and screen [place]     Status: None   Collection Time: 06/30/20  1:50 PM  Result Value Ref Range   ABO/RH(D) A POS  Antibody Screen NEG    Sample Expiration 07/03/2020,2359    Unit Number  [id number]    Blood Component Type RBC LR PHER1    Unit division 00    Status of Unit ISSUED,FINAL    Transfusion Status OK TO TRANSFUSE    Crossmatch Result      Compatible Performed at [place] [address], [place], [place] [address]    Unit Number [id number]    Blood Component Type RBC LR PHER2    Unit division 00    Status of Unit ISSUED,FINAL    Transfusion Status OK TO TRANSFUSE    Crossmatch Result Compatible   BPAM RBC     Status: None   Collection Time: 06/30/20  1:50 PM  Result Value Ref Range   ISSUE DATE / TIME [id number]    Blood Product Unit Number [id number]    PRODUCT CODE [id number]    Unit Type and Rh 9407    Blood Product Expiration Date 680881103159    ISSUE DATE / TIME 458592924462    Blood Product Unit Number [id number]    PRODUCT CODE [id number]    Unit Type and Rh 6200    Blood Product Expiration Date 291916606004   Glucose, capillary     Status: Abnormal   Collection Time: 06/30/20 10:52 PM  Result Value Ref Range   Glucose-Capillary 154 (H) 70 - 99 mg/dL    Comment: Glucose reference range applies only to samples taken after fasting for at least 8 hours.  Ferritin     Status: None   Collection Time: 07/01/20 12:32 AM  Result Value Ref Range   Ferritin 85 11 - 307 ng/mL    Comment: Performed at [facility], [place] [address], [place], [place] [address]  Iron and TIBC     Status: Abnormal   Collection Time: 07/01/20 12:32 AM  Result Value Ref Range   Iron 127 28 - 170 ug/dL   TIBC 364 250 - 450 ug/dL   Saturation Ratios 35 (H) 10.4 - 31.8 %   UIBC 237 ug/dL    Comment: Performed at [facility], [place] [address], [place], [place] [address]  Hemoglobin and hematocrit, blood     Status: Abnormal   Collection Time: 07/01/20 12:32 AM  Result Value Ref Range   Hemoglobin 8.6 (L) 12.0 - 15.0 g/dL   HCT 27.0 (L) 36.0 - 46.0 %    Comment: Performed at [facility], [place] [address], [name], [place] [address]  Basic metabolic panel     Status: Abnormal   Collection Time: 07/01/20 12:32 AM  Result Value Ref Range   Sodium 142 135 - 145 mmol/L   Potassium 5.0 3.5 - 5.1 mmol/L   Chloride 111 98 - 111 mmol/L   CO2 20 (L) 22 - 32 mmol/L   Glucose, Bld 181 (H) 70 - 99 mg/dL    Comment: Glucose reference range applies only to samples taken after fasting for at least 8 hours.   BUN 44 (H) 8 - 23 mg/dL   Creatinine, Ser 1.93 (H) 0.44 - 1.00 mg/dL   Calcium 8.6 (L) 8.9 - 10.3 mg/dL   GFR, Estimated 26 (L) >60 mL/min    Comment: (NOTE) Calculated using the CKD-EPI Creatinine Equation (2021)    Anion gap 11 5 - 15    Comment: Performed at [facility], [place] [address], [place], [place] [address]  CBC     Status: Abnormal  Collection Time: 07/01/20 12:32 AM  Result Value Ref Range   WBC 4.8 4.0 - 10.5 K/uL   RBC 2.77 (L) 3.87 - 5.11 MIL/uL   Hemoglobin 8.5 (L) 12.0 - 15.0 g/dL   HCT 27.7 (L) 36.0 - 46.0 %   MCV 100.0 80.0 - 100.0 fL   MCH 30.7 26.0 - 34.0 pg   MCHC 30.7 30.0 - 36.0 g/dL   RDW 21.7 (H) 11.5 - 15.5 %   Platelets 115 (L) 150 - 400 K/uL    Comment: REPEATED TO VERIFY PLATELET COUNT CONFIRMED BY SMEAR SPECIMEN CHECKED FOR CLOTS Immature Platelet Fraction may be clinically indicated, consider ordering this additional test [id number]    nRBC 0.0 0.0 - 0.2 %    Comment: Performed at [facility], [place] [address] [place], [place] [address]  Protime-INR Once     Status: Abnormal   Collection Time: 07/01/20 12:32 AM  Result Value Ref Range   Prothrombin Time 18.1 (H) 11.4 - 15.2 seconds   INR 1.6 (H) 0.8 - 1.2    Comment: (NOTE) INR goal varies based on device and disease states. Performed at [facility], [place] [address], [place], [place] [address]   Glucose, capillary     Status: Abnormal   Collection Time: 07/01/20  7:38 AM  Result Value Ref Range    Glucose-Capillary 141 (H) 70 - 99 mg/dL    Comment: Glucose reference range applies only to samples taken after fasting for at least 8 hours.  Glucose, capillary     Status: Abnormal   Collection Time: 07/01/20 11:41 AM  Result Value Ref Range   Glucose-Capillary 130 (H) 70 - 99 mg/dL    Comment: Glucose reference range applies only to samples taken after fasting for at least 8 hours.  Glucose, capillary     Status: Abnormal   Collection Time: 07/01/20  5:44 PM  Result Value Ref Range   Glucose-Capillary 106 (H) 70 - 99 mg/dL    Comment: Glucose reference range applies only to samples taken after fasting for at least 8 hours.  Glucose, capillary     Status: Abnormal   Collection Time: 07/01/20  9:38 PM  Result Value Ref Range   Glucose-Capillary 132 (H) 70 - 99 mg/dL    Comment: Glucose reference range applies only to samples taken after fasting for at least 8 hours.   Comment 1 Notify RN   Basic metabolic panel     Status: Abnormal   Collection Time: 07/02/20  5:29 AM  Result Value Ref Range   Sodium 143 135 - 145 mmol/L   Potassium 4.4 3.5 - 5.1 mmol/L   Chloride 112 (H) 98 - 111 mmol/L   CO2 22 22 - 32 mmol/L   Glucose, Bld 139 (H) 70 - 99 mg/dL    Comment: Glucose reference range applies only to samples taken after fasting for at least 8 hours.   BUN 32 (H) 8 - 23 mg/dL   Creatinine, Ser 1.53 (H) 0.44 - 1.00 mg/dL   Calcium 8.2 (L) 8.9 - 10.3 mg/dL   GFR, Estimated 35 (L) >60 mL/min    Comment: (NOTE) Calculated using the CKD-EPI Creatinine Equation (2021)    Anion gap 9 5 - 15    Comment: Performed at [facility], [place] [address], [place], [place] [address]  CBC     Status: Abnormal   Collection Time: 07/02/20  5:29 AM  Result Value Ref Range   WBC 3.2 (  L) 4.0 - 10.5 K/uL   RBC 2.48 (L) 3.87 - 5.11 MIL/uL   Hemoglobin 7.6 (L) 12.0 - 15.0 g/dL   HCT 25.3 (L) 36.0 - 46.0 %   MCV 102.0 (H) 80.0 - 100.0 fL   MCH 30.6 26.0 - 34.0 pg   MCHC 30.0 30.0 -  36.0 g/dL   RDW 22.1 (H) 11.5 - 15.5 %   Platelets 93 (L) 150 - 400 K/uL    Comment: REPEATED TO VERIFY Immature Platelet Fraction may be clinically indicated, consider ordering this additional test [id number] CONSISTENT WITH PREVIOUS RESULT    nRBC 0.0 0.0 - 0.2 %    Comment: Performed at [facility], [place] [address] [place] [address] [place], [place] [address]  Magnesium     Status: None   Collection Time: 07/02/20  5:29 AM  Result Value Ref Range   Magnesium 2.0 1.7 - 2.4 mg/dL    Comment: Performed at [facility], [place] [address], [place], [place] [address]  Glucose, capillary     Status: Abnormal   Collection Time: 07/02/20  7:37 AM  Result Value Ref Range   Glucose-Capillary 135 (H) 70 - 99 mg/dL    Comment: Glucose reference range applies only to samples taken after fasting for at least 8 hours.  Glucose, capillary     Status: Abnormal   Collection Time: 07/02/20 11:41 AM  Result Value Ref Range   Glucose-Capillary 202 (H) 70 - 99 mg/dL    Comment: Glucose reference range applies only to samples taken after fasting for at least 8 hours.  Hemoglobin and hematocrit, blood     Status: Abnormal   Collection Time: 07/02/20  2:33 PM  Result Value Ref Range   Hemoglobin 8.4 (L) 12.0 - 15.0 g/dL   HCT 27.2 (L) 36.0 - 46.0 %    Comment: Performed at [facility], [place] [address], [place], [place] [address]  Glucose, capillary     Status: Abnormal   Collection Time: 07/02/20  4:18 PM  Result Value Ref Range   Glucose-Capillary 111 (H) 70 - 99 mg/dL    Comment: Glucose reference range applies only to samples taken after fasting for at least 8 hours.  Glucose, capillary     Status: Abnormal   Collection Time: 07/02/20  9:26 PM  Result Value Ref Range   Glucose-Capillary 211 (H) 70 - 99 mg/dL    Comment: Glucose reference range applies only to samples taken after fasting for at least 8 hours.  Basic metabolic panel     Status: Abnormal    Collection Time: 07/03/20  5:20 AM  Result Value Ref Range   Sodium 141 135 - 145 mmol/L   Potassium 4.2 3.5 - 5.1 mmol/L   Chloride 110 98 - 111 mmol/L   CO2 21 (L) 22 - 32 mmol/L   Glucose, Bld 170 (H) 70 - 99 mg/dL    Comment: Glucose reference range applies only to samples taken after fasting for at least 8 hours.   BUN 25 (H) 8 - 23 mg/dL   Creatinine, Ser 1.49 (H) 0.44 - 1.00 mg/dL   Calcium 8.2 (L) 8.9 - 10.3 mg/dL   GFR, Estimated 36 (L) >60 mL/min    Comment: (NOTE) Calculated using the CKD-EPI Creatinine Equation (2021)    Anion gap 10 5 - 15    Comment: Performed at [facility], [place] [address], [place], [place] [address]  CBC     Status: Abnormal  Collection Time: 07/03/20  5:20 AM  Result Value Ref Range   WBC 3.8 (L) 4.0 - 10.5 K/uL   RBC 2.57 (L) 3.87 - 5.11 MIL/uL   Hemoglobin 8.0 (L) 12.0 - 15.0 g/dL   HCT 26.3 (L) 36.0 - 46.0 %   MCV 102.3 (H) 80.0 - 100.0 fL   MCH 31.1 26.0 - 34.0 pg   MCHC 30.4 30.0 - 36.0 g/dL   RDW 21.4 (H) 11.5 - 15.5 %   Platelets 96 (L) 150 - 400 K/uL    Comment: REPEATED TO VERIFY Immature Platelet Fraction may be clinically indicated, consider ordering this additional test [id number] CONSISTENT WITH PREVIOUS RESULT    nRBC 0.0 0.0 - 0.2 %    Comment: Performed at [facility], [place] [address], [place], [place] [address]  Glucose, capillary     Status: Abnormal   Collection Time: 07/03/20  7:37 AM  Result Value Ref Range   Glucose-Capillary 178 (H) 70 - 99 mg/dL    Comment: Glucose reference range applies only to samples taken after fasting for at least 8 hours.  Glucose, capillary     Status: Abnormal   Collection Time: 07/03/20 11:05 AM  Result Value Ref Range   Glucose-Capillary 145 (H) 70 - 99 mg/dL    Comment: Glucose reference range applies only to samples taken after fasting for at least 8 hours.  Glucose, capillary     Status: Abnormal   Collection Time: 07/03/20  4:32 PM  Result  Value Ref Range   Glucose-Capillary 146 (H) 70 - 99 mg/dL    Comment: Glucose reference range applies only to samples taken after fasting for at least 8 hours.  Glucose, capillary     Status: Abnormal   Collection Time: 07/03/20  9:38 PM  Result Value Ref Range   Glucose-Capillary 145 (H) 70 - 99 mg/dL    Comment: Glucose reference range applies only to samples taken after fasting for at least 8 hours.   Comment 1 Notify RN   Basic metabolic panel     Status: Abnormal   Collection Time: 07/04/20  4:48 AM  Result Value Ref Range   Sodium 143 135 - 145 mmol/L   Potassium 4.3 3.5 - 5.1 mmol/L   Chloride 111 98 - 111 mmol/L   CO2 20 (L) 22 - 32 mmol/L   Glucose, Bld 156 (H) 70 - 99 mg/dL    Comment: Glucose reference range applies only to samples taken after fasting for at least 8 hours.   BUN 21 8 - 23 mg/dL   Creatinine, Ser 1.47 (H) 0.44 - 1.00 mg/dL   Calcium 8.3 (L) 8.9 - 10.3 mg/dL   GFR, Estimated 36 (L) >60 mL/min    Comment: (NOTE) Calculated using the CKD-EPI Creatinine Equation (2021)    Anion gap 12 5 - 15    Comment: Performed at [facility], [place] [address], [place], [place] [address]  CBC     Status: Abnormal   Collection Time: 07/04/20  4:48 AM  Result Value Ref Range   WBC 4.4 4.0 - 10.5 K/uL   RBC 2.66 (L) 3.87 - 5.11 MIL/uL   Hemoglobin 8.2 (L) 12.0 - 15.0 g/dL   HCT 27.7 (L) 36.0 - 46.0 %   MCV 104.1 (H) 80.0 - 100.0 fL   MCH 30.8 26.0 - 34.0 pg   MCHC 29.6 (L) 30.0 - 36.0 g/dL   RDW 21.2 (H) 11.5 - 15.5 %   Platelets 95 (  L) 150 - 400 K/uL    Comment: Immature Platelet Fraction may be clinically indicated, consider ordering this additional test [id number] CONSISTENT WITH PREVIOUS RESULT    nRBC 0.0 0.0 - 0.2 %    Comment: Performed at [facility], [place] [address] [place], [place] [address]  Glucose, capillary     Status: Abnormal   Collection Time: 07/04/20  7:51 AM  Result Value Ref Range   Glucose-Capillary 143  (H) 70 - 99 mg/dL    Comment: Glucose reference range applies only to samples taken after fasting for at least 8 hours.  Glucose, capillary     Status: Abnormal   Collection Time: 07/04/20 11:43 AM  Result Value Ref Range   Glucose-Capillary 169 (H) 70 - 99 mg/dL    Comment: Glucose reference range applies only to samples taken after fasting for at least 8 hours.  Glucose, capillary     Status: Abnormal   Collection Time: 07/04/20  4:33 PM  Result Value Ref Range   Glucose-Capillary 121 (H) 70 - 99 mg/dL    Comment: Glucose reference range applies only to samples taken after fasting for at least 8 hours.  Basic metabolic panel     Status: Abnormal   Collection Time: 07/05/20  4:51 AM  Result Value Ref Range   Sodium 139 135 - 145 mmol/L   Potassium 4.5 3.5 - 5.1 mmol/L   Chloride 109 98 - 111 mmol/L   CO2 22 22 - 32 mmol/L   Glucose, Bld 172 (H) 70 - 99 mg/dL    Comment: Glucose reference range applies only to samples taken after fasting for at least 8 hours.   BUN 18 8 - 23 mg/dL   Creatinine, Ser 1.51 (H) 0.44 - 1.00 mg/dL   Calcium 8.5 (L) 8.9 - 10.3 mg/dL   GFR, Estimated 35 (L) >60 mL/min    Comment: (NOTE) Calculated using the CKD-EPI Creatinine Equation (2021)    Anion gap 8 5 - 15    Comment: Performed at [facility], [place] [address] [place], [place] [address]  CBC     Status: Abnormal   Collection Time: 07/05/20  4:51 AM  Result Value Ref Range   WBC 4.5 4.0 - 10.5 K/uL   RBC 2.68 (L) 3.87 - 5.11 MIL/uL   Hemoglobin 8.5 (L) 12.0 - 15.0 g/dL   HCT 27.6 (L) 36.0 - 46.0 %   MCV 103.0 (H) 80.0 - 100.0 fL   MCH 31.7 26.0 - 34.0 pg   MCHC 30.8 30.0 - 36.0 g/dL   RDW 20.1 (H) 11.5 - 15.5 %   Platelets 95 (L) 150 - 400 K/uL    Comment: REPEATED TO VERIFY Immature Platelet Fraction may be clinically indicated, consider ordering this additional test [id number] CONSISTENT WITH PREVIOUS RESULT    nRBC 0.0 0.0 - 0.2 %    Comment: Performed at [facility], [place] [address], [place], [place] [address]  Glucose, capillary     Status: Abnormal   Collection Time: 07/05/20  8:01 AM  Result Value Ref Range   Glucose-Capillary 152 (H) 70 - 99 mg/dL    Comment: Glucose reference range applies only to samples taken after fasting for at least 8 hours.  Glucose, capillary     Status: Abnormal   Collection Time: 07/05/20 11:55 AM  Result Value Ref Range   Glucose-Capillary 172 (H) 70 - 99 mg/dL    Comment: Glucose reference range applies only to samples  taken after fasting for at least 8 hours.  Glucose, capillary     Status: Abnormal   Collection Time: 07/05/20  4:52 PM  Result Value Ref Range   Glucose-Capillary 119 (H) 70 - 99 mg/dL    Comment: Glucose reference range applies only to samples taken after fasting for at least 8 hours.  Iron     Status: Abnormal   Collection Time: 07/21/20 11:33 AM  Result Value Ref Range   Iron 26 (L) 42 - 145 ug/dL  CBC w/Diff     Status: Abnormal   Collection Time: 07/21/20 11:33 AM  Result Value Ref Range   WBC 5.2 4.0 - 10.5 K/uL   RBC 2.38 Repeated and verified X2. (L) 3.87 - 5.11 Mil/uL   Hemoglobin 7.2 Repeated and verified X2. (LL) 12.0 - 15.0 g/dL   HCT 22.2 Repeated and verified X2. (LL) 36.0 - 46.0 %   MCV 93.1 78.0 - 100.0 fl   MCHC 32.3 30.0 - 36.0 g/dL   RDW 17.8 (H) 11.5 - 15.5 %   Platelets 147.0 (L) 150.0 - 400.0 K/uL   Neutrophils Relative % 56.3 43.0 - 77.0 %   Lymphocytes Relative 34.7 12.0 - 46.0 %   Monocytes Relative 6.6 3.0 - 12.0 %   Eosinophils Relative 1.6 0.0 - 5.0 %   Basophils Relative 0.8 0.0 - 3.0 %   Neutro Abs 2.9 1.4 - 7.7 K/uL   Lymphs Abs 1.8 0.7 - 4.0 K/uL   Monocytes Absolute 0.3 0.1 - 1.0 K/uL   Eosinophils Absolute 0.1 0.0 - 0.7 K/uL   Basophils Absolute 0.0 0.0 - 0.1 K/uL  Comp Met (CMET)     Status: Abnormal   Collection Time: 07/21/20 11:33 AM  Result Value Ref Range   Sodium 141 135 - 145 mEq/L   Potassium 5.3 No hemolysis seen (H)  3.5 - 5.1 mEq/L   Chloride 108 96 - 112 mEq/L   CO2 27 19 - 32 mEq/L   Glucose, Bld 140 (H) 70 - 99 mg/dL   BUN 22 6 - 23 mg/dL   Creatinine, Ser 1.70 (H) 0.40 - 1.20 mg/dL   Total Bilirubin 0.6 0.2 - 1.2 mg/dL   Alkaline Phosphatase 90 39 - 117 U/L   AST 19 0 - 37 U/L   ALT 11 0 - 35 U/L   Total Protein 5.8 (L) 6.0 - 8.3 g/dL   Albumin 3.3 (L) 3.5 - 5.2 g/dL   GFR 28.55 (L) >60.00 mL/min    Comment: Calculated using the CKD-EPI Creatinine Equation (2021)   Calcium 8.6 8.4 - 10.5 mg/dL  Ferritin     Status: None   Collection Time: 07/21/20 11:33 AM  Result Value Ref Range   Ferritin 13.5 10.0 - 291.0 ng/mL  Iron,Total/Total Iron Binding Cap     Status: Abnormal   Collection Time: 07/21/20 11:33 AM  Result Value Ref Range   Iron 21 (L) 45 - 160 mcg/dL   TIBC 336 250 - 450 mcg/dL (calc)   %SAT 6 (L) 16 - 45 % (calc)  Basic metabolic panel     Status: Abnormal   Collection Time: 07/25/20 11:31 AM  Result Value Ref Range   Sodium 138 135 - 145 mEq/L   Potassium 4.8 3.5 - 5.1 mEq/L   Chloride 107 96 - 112 mEq/L   CO2 26 19 - 32 mEq/L   Glucose, Bld 144 (H) 70 - 99 mg/dL   BUN 16 6 - 23 mg/dL   Creatinine,  Ser 1.42 (H) 0.40 - 1.20 mg/dL   GFR 35.43 (L) >60.00 mL/min    Comment: Calculated using the CKD-EPI Creatinine Equation (2021)   Calcium 8.6 8.4 - 10.5 mg/dL  CBC with Differential/Platelet     Status: Abnormal   Collection Time: 07/25/20 11:31 AM  Result Value Ref Range   WBC 4.9 4.0 - 10.5 K/uL   RBC 2.44 (L) 3.87 - 5.11 Mil/uL   Hemoglobin 7.2 Repeated and verified X2. (LL) 12.0 - 15.0 g/dL   HCT 22.2 (LL) 36.0 - 46.0 %   MCV 91.1 78.0 - 100.0 fl   MCHC 32.3 30.0 - 36.0 g/dL   RDW 18.4 (H) 11.5 - 15.5 %   Platelets 155.0 150.0 - 400.0 K/uL   Neutrophils Relative % 58.0 43.0 - 77.0 %   Lymphocytes Relative 34.3 12.0 - 46.0 %   Monocytes Relative 5.7 3.0 - 12.0 %   Eosinophils Relative 1.3 0.0 - 5.0 %   Basophils Relative 0.7 0.0 - 3.0 %   Neutro Abs 2.8 1.4 -  7.7 K/uL   Lymphs Abs 1.7 0.7 - 4.0 K/uL   Monocytes Absolute 0.3 0.1 - 1.0 K/uL   Eosinophils Absolute 0.1 0.0 - 0.7 K/uL   Basophils Absolute 0.0 0.0 - 0.1 K/uL  Sample to Blood Bank     Status: None   Collection Time: 07/26/20 10:34 AM  Result Value Ref Range   Blood Bank Specimen SAMPLE AVAILABLE FOR TESTING    Sample Expiration      07/29/2020,2359 Performed at [facility], [place] [address], [place], [place] [address]   CBC with Differential ([place] Only)     Status: Abnormal   Collection Time: 07/26/20 10:34 AM  Result Value Ref Range   WBC Count 4.9 4.0 - 10.5 K/uL   RBC 2.24 (L) 3.87 - 5.11 MIL/uL   Hemoglobin 6.5 (LL) 12.0 - 15.0 g/dL    Comment: REPEATED TO VERIFY THIS CRITICAL RESULT HAS VERIFIED AND BEEN CALLED TO SUSAN COWARD RN BY SCOTTON, JAY ON 01 11 2022 AT 1051, AND HAS BEEN READ BACK.     HCT 21.6 (L) 36.0 - 46.0 %   MCV 96.4 80.0 - 100.0 fL   MCH 29.0 26.0 - 34.0 pg   MCHC 30.1 30.0 - 36.0 g/dL   RDW 17.0 (H) 11.5 - 15.5 %   Platelet Count 142 (L) 150 - 400 K/uL   nRBC 0.0 0.0 - 0.2 %   Neutrophils Relative % 53 %   Neutro Abs 2.6 1.7 - 7.7 K/uL   Lymphocytes Relative 38 %   Lymphs Abs 1.9 0.7 - 4.0 K/uL   Monocytes Relative 7 %   Monocytes Absolute 0.3 0.1 - 1.0 K/uL   Eosinophils Relative 2 %   Eosinophils Absolute 0.1 0.0 - 0.5 K/uL   Basophils Relative 0 %   Basophils Absolute 0.0 0.0 - 0.1 K/uL   Immature Granulocytes 0 %   Abs Immature Granulocytes 0.01 0.00 - 0.07 K/uL    Comment: Performed at [facility], [place] [address], [place], [place] [address]  Type and screen     Status: None   Collection Time: 07/26/20 11:37 AM  Result Value Ref Range   ABO/RH(D) A POS    Antibody Screen NEG    Sample Expiration 07/29/2020,2359    Unit Number [id number]    Blood Component Type RED CELLS,LR    Unit division 00    Status of Unit ISSUED,FINAL  Transfusion Status OK TO TRANSFUSE    Crossmatch  Result      Compatible Performed at [place] [address], [place], [place] [address]   BPAM RBC     Status: None   Collection Time: 07/26/20 11:37 AM  Result Value Ref Range   ISSUE DATE / TIME [id number]    Blood Product Unit Number [id number]    PRODUCT CODE [id number]    Unit Type and Rh 6200    Blood Product Expiration Date [id number]   Sample to Blood Bank     Status: None   Collection Time: 08/02/20 10:48 AM  Result Value Ref Range   Blood Bank Specimen SAMPLE AVAILABLE FOR TESTING    Sample Expiration      08/05/2020,2359 Performed at [facility], [place] [address], [place], [place] [address]   CBC with Differential ([place] Only)     Status: Abnormal   Collection Time: 08/02/20 10:49 AM  Result Value Ref Range   WBC Count 4.1 4.0 - 10.5 K/uL   RBC 2.47 (L) 3.87 - 5.11 MIL/uL   Hemoglobin 7.1 (L) 12.0 - 15.0 g/dL   HCT 24.6 (L) 36.0 - 46.0 %   MCV 99.6 80.0 - 100.0 fL   MCH 28.7 26.0 - 34.0 pg   MCHC 28.9 (L) 30.0 - 36.0 g/dL   RDW 17.3 (H) 11.5 - 15.5 %   Platelet Count 110 (L) 150 - 400 K/uL   nRBC 0.0 0.0 - 0.2 %   Neutrophils Relative % 57 %   Neutro Abs 2.3 1.7 - 7.7 K/uL   Lymphocytes Relative 35 %   Lymphs Abs 1.4 0.7 - 4.0 K/uL   Monocytes Relative 5 %   Monocytes Absolute 0.2 0.1 - 1.0 K/uL   Eosinophils Relative 1 %   Eosinophils Absolute 0.1 0.0 - 0.5 K/uL   Basophils Relative 1 %   Basophils Absolute 0.0 0.0 - 0.1 K/uL   Immature Granulocytes 1 %   Abs Immature Granulocytes 0.02 0.00 - 0.07 K/uL    Comment: Performed at [facility], [place] [address], [place], [place] [address]  Ferritin     Status: None   Collection Time: 08/02/20 10:49 AM  Result Value Ref Range   Ferritin 27 11 - 307 ng/mL    Comment: Performed at [facility], [place] [address], [place], [place] [address]  Comp Met (CMET)     Status: Abnormal   Collection Time: 08/04/20 12:01  PM  Result Value Ref Range   Sodium 141 135 - 145 mEq/L   Potassium 4.5 3.5 - 5.1 mEq/L   Chloride 112 96 - 112 mEq/L   CO2 25 19 - 32 mEq/L   Glucose, Bld 117 (H) 70 - 99 mg/dL   BUN 18 6 - 23 mg/dL   Creatinine, Ser 1.45 (H) 0.40 - 1.20 mg/dL   Total Bilirubin 0.6 0.2 - 1.2 mg/dL   Alkaline Phosphatase 84 39 - 117 U/L   AST 19 0 - 37 U/L   ALT 9 0 - 35 U/L   Total Protein 5.8 (L) 6.0 - 8.3 g/dL   Albumin 3.3 (L) 3.5 - 5.2 g/dL   GFR 34.54 (L) >60.00 mL/min    Comment: Calculated using the CKD-EPI Creatinine Equation (2021)   Calcium 8.9 8.4 - 10.5 mg/dL  Type and screen     Status: None   Collection Time: 08/05/20  9:38 AM  Result Value Ref  Range   ABO/RH(D) A POS    Antibody Screen NEG    Sample Expiration 08/08/2020,2359    Unit Number [id number]    Blood Component Type RED CELLS,LR    Unit division 00    Status of Unit ISSUED,FINAL    Transfusion Status OK TO TRANSFUSE    Crossmatch Result      Compatible Performed at [facility], [address] [place], [place] [address]   BPAM RBC     Status: None   Collection Time: 08/05/20  9:38 AM  Result Value Ref Range   ISSUE DATE / TIME 202201211219    Blood Product Unit Number [id number]    PRODUCT CODE [id number]    Unit Type and Rh 6200    Blood Product Expiration Date [id number]   CBC with Differential ([facility] Only)     Status: Abnormal   Collection Time: 08/05/20  9:50 AM  Result Value Ref Range   WBC Count 4.7 4.0 - 10.5 K/uL   RBC 2.59 (L) 3.87 - 5.11 MIL/uL   Hemoglobin 7.5 (L) 12.0 - 15.0 g/dL   HCT 65.3 (L) 28.6 - 71.8 %   MCV 98.8 80.0 - 100.0 fL   MCH 29.0 26.0 - 34.0 pg   MCHC 29.3 (L) 30.0 - 36.0 g/dL   RDW 88.3 (H) 86.7 - 52.5 %   Platelet Count 132 (L) 150 - 400 K/uL   nRBC 0.0 0.0 - 0.2 %   Neutrophils Relative % 62 %   Neutro Abs 3.0 1.7 - 7.7 K/uL   Lymphocytes Relative 29 %   Lymphs Abs 1.4 0.7 - 4.0 K/uL   Monocytes Relative 6 %   Monocytes Absolute 0.3 0.1  - 1.0 K/uL   Eosinophils Relative 2 %   Eosinophils Absolute 0.1 0.0 - 0.5 K/uL   Basophils Relative 1 %   Basophils Absolute 0.0 0.0 - 0.1 K/uL   Immature Granulocytes 0 %   Abs Immature Granulocytes 0.01 0.00 - 0.07 K/uL    Comment: Performed at [facility], [address] [place], [place] [address]  Prepare RBC (crossmatch)     Status: None   Collection Time: 08/05/20  4:21 PM  Result Value Ref Range   Order Confirmation      ORDER PROCESSED BY BLOOD BANK Performed at [facility], [address], [place], [place] [address]   Sample to Blood Bank     Status: None   Collection Time: 08/12/20  7:16 AM  Result Value Ref Range   Blood Bank Specimen SAMPLE AVAILABLE FOR TESTING    Sample Expiration      08/15/2020,2359 Performed at [facility], [address], [place], [place] [address]   CBC with Differential ([facility] Only)     Status: Abnormal   Collection Time: 08/12/20  7:16 AM  Result Value Ref Range   WBC Count 4.7 4.0 - 10.5 K/uL   RBC 3.30 (L) 3.87 - 5.11 MIL/uL   Hemoglobin 9.4 (L) 12.0 - 15.0 g/dL   HCT 80.7 (L) 05.6 - 32.1 %   MCV 98.5 80.0 - 100.0 fL   MCH 28.5 26.0 - 34.0 pg   MCHC 28.9 (L) 30.0 - 36.0 g/dL   RDW 18.4 (H) 91.4 - 90.8 %   Platelet Count 132 (L) 150 - 400 K/uL   nRBC 0.0 0.0 - 0.2 %   Neutrophils Relative % 58 %   Neutro Abs 2.7 1.7 -  7.7 K/uL   Lymphocytes Relative 31 %   Lymphs Abs 1.4 0.7 - 4.0 K/uL   Monocytes Relative 8 %   Monocytes Absolute 0.4 0.1 - 1.0 K/uL   Eosinophils Relative 2 %   Eosinophils Absolute 0.1 0.0 - 0.5 K/uL   Basophils Relative 1 %   Basophils Absolute 0.0 0.0 - 0.1 K/uL   Immature Granulocytes 0 %   Abs Immature Granulocytes 0.01 0.00 - 0.07 K/uL    Comment: Performed at [facility], [place] [address], [place], [place] [address]  Ferritin     Status: None   Collection Time: 08/12/20  7:16 AM  Result Value Ref Range    Ferritin 179 11 - 307 ng/mL    Comment: Performed at [facility], [place] [address] [place], [place] [address]    Assessment/Plan: 1. Peripheral edema Patient convinced that her propranolol is causing her leg swelling.  Discussed while this is a potential side effect it is not one commonly encountered, especially as she has had issues with swelling prior to the addition of propranolol.  Will decrease to half tablet twice daily for the next for 5 days before stopping to prevent any rebound hypertension or tachycardia.  She is to monitor swelling very closely.  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  We will have her increase furosemide to 1 tablet every morning and half a tablet in the evening for the next few days.  Close follow-up scheduled. - Comp Met (CMET)  This visit occurred during the SARS-CoV-2 public health emergency.  Safety protocols were in place, including screening questions prior to the visit, additional usage of staff PPE, and extensive cleaning of exam room while observing appropriate contact time as indicated for disinfecting solutions.     Leeanne Rio, PA-C

## 2020-08-17 ENCOUNTER — Telehealth: Payer: Self-pay | Admitting: Internal Medicine

## 2020-08-17 ENCOUNTER — Other Ambulatory Visit: Payer: Self-pay

## 2020-08-17 DIAGNOSIS — K7460 Unspecified cirrhosis of liver: Secondary | ICD-10-CM

## 2020-08-17 DIAGNOSIS — R188 Other ascites: Secondary | ICD-10-CM

## 2020-08-17 MED ORDER — METRONIDAZOLE 250 MG PO TABS: 250.0000 mg | ORAL_TABLET | Freq: Three times a day (TID) | ORAL | 0 refills | Status: DC

## 2020-08-17 NOTE — Telephone Encounter (Signed)
Spoke with pt and she is aware. Script sent to pharmacy, order in for lab. Pt knows to come M-F between 50:30am-5pm for the lab, no appt is needed. Pt scheduled for IR para at [facility] /@9am , pt to arrive there at 8:45am. Orders per Dr. Henrene Pastor for lab, amt of fluid drawn off and albumin. Pt aware./

## 2020-08-17 NOTE — Telephone Encounter (Signed)
Pt states for the past couple of days she has been having uncontrollable diarrhea, states she has not been on any recent antibiotics..States she is going about 3-4 times/day. Reports that her stomach is very sore. Also reports that her stomach is distended and she has had a 6 pound wt cain since 07/26/20. Please advise.

## 2020-08-17 NOTE — Telephone Encounter (Signed)
1.  Stool for C. difficile 2.  Start empiric metronidazole 250 mg 3 times daily x7 days 3.  Schedule ultrasound-guided paracentesis (assuming there is drainable ascites).  Up to 3 L.  Albumin replacement.  Send the fluid for cell count with differential 4.  Imodium as directed for diarrhea 5.  Check on her Monday to see how she is doing

## 2020-08-18 ENCOUNTER — Other Ambulatory Visit: Payer: Medicare Other

## 2020-08-19 ENCOUNTER — Other Ambulatory Visit: Payer: Self-pay

## 2020-08-19 ENCOUNTER — Ambulatory Visit (HOSPITAL_COMMUNITY)
Admission: RE | Admit: 2020-08-19 | Discharge: 2020-08-19 | Disposition: A | Discharge status: Home / Self Care | Payer: Medicare Other | Source: Ambulatory Visit | Attending: Internal Medicine | Admitting: Internal Medicine

## 2020-08-19 DIAGNOSIS — K7460 Unspecified cirrhosis of liver: Secondary | ICD-10-CM | POA: Insufficient documentation

## 2020-08-19 DIAGNOSIS — R188 Other ascites: Secondary | ICD-10-CM | POA: Diagnosis not present

## 2020-08-19 HISTORY — PX: IR PARACENTESIS: IMG2679

## 2020-08-19 LAB — BODY FLUID CELL COUNT WITH DIFFERENTIAL
Eos, Fluid: NONE SEEN %
Lymphs, Fluid: 51 %
Monocyte-Macrophage-Serous Fluid: 25 % — ABNORMAL LOW (ref 50–90)
Neutrophil Count, Fluid: 24 % (ref 0–25)
Total Nucleated Cell Count, Fluid: 485 cu mm (ref 0–1000)

## 2020-08-19 MED ORDER — LIDOCAINE HCL 1 % IJ SOLN
INTRAMUSCULAR | Status: AC
  Filled 2020-08-19: qty 20

## 2020-08-19 MED ORDER — ALBUMIN HUMAN 25 % IV SOLN
25.0000 g | Freq: Once | INTRAVENOUS | Status: DC
  Filled 2020-08-19: qty 100

## 2020-08-19 MED ORDER — ALBUMIN HUMAN 25 % IV SOLN
INTRAVENOUS | Status: AC
  Filled 2020-08-19: qty 100

## 2020-08-19 NOTE — Procedures (Signed)
PROCEDURE SUMMARY:  Successful US guided paracentesis from RLQ.  Yielded 3 L of clear yellow fluid.  No immediate complications.  Pt tolerated well.   Specimen was sent for labs.  EBL < 53mL  Ascencion Dike PA-C 08/19/2020 9:50 AM

## 2020-08-22 LAB — PATHOLOGIST SMEAR REVIEW

## 2020-08-23 ENCOUNTER — Inpatient Hospital Stay: Payer: Medicare Other | Admitting: Physician Assistant

## 2020-08-23 ENCOUNTER — Other Ambulatory Visit: Payer: Medicare Other

## 2020-08-23 ENCOUNTER — Inpatient Hospital Stay: Payer: Medicare Other | Attending: Oncology

## 2020-08-23 ENCOUNTER — Other Ambulatory Visit: Payer: Self-pay

## 2020-08-23 VITALS — BP 146/62 | HR 68 | Temp 98.1°F | Resp 19 | Ht 67.0 in | Wt 214.4 lb

## 2020-08-23 DIAGNOSIS — Z905 Acquired absence of kidney: Secondary | ICD-10-CM | POA: Insufficient documentation

## 2020-08-23 DIAGNOSIS — N2889 Other specified disorders of kidney and ureter: Secondary | ICD-10-CM | POA: Insufficient documentation

## 2020-08-23 DIAGNOSIS — D500 Iron deficiency anemia secondary to blood loss (chronic): Secondary | ICD-10-CM | POA: Insufficient documentation

## 2020-08-23 DIAGNOSIS — Z79899 Other long term (current) drug therapy: Secondary | ICD-10-CM | POA: Diagnosis not present

## 2020-08-23 DIAGNOSIS — C189 Malignant neoplasm of colon, unspecified: Secondary | ICD-10-CM

## 2020-08-23 DIAGNOSIS — Z886 Allergy status to analgesic agent status: Secondary | ICD-10-CM | POA: Insufficient documentation

## 2020-08-23 DIAGNOSIS — Z86718 Personal history of other venous thrombosis and embolism: Secondary | ICD-10-CM | POA: Diagnosis not present

## 2020-08-23 DIAGNOSIS — Z85038 Personal history of other malignant neoplasm of large intestine: Secondary | ICD-10-CM | POA: Insufficient documentation

## 2020-08-23 DIAGNOSIS — D649 Anemia, unspecified: Secondary | ICD-10-CM

## 2020-08-23 DIAGNOSIS — K7469 Other cirrhosis of liver: Secondary | ICD-10-CM | POA: Diagnosis not present

## 2020-08-23 DIAGNOSIS — D62 Acute posthemorrhagic anemia: Secondary | ICD-10-CM | POA: Diagnosis not present

## 2020-08-23 DIAGNOSIS — Z9049 Acquired absence of other specified parts of digestive tract: Secondary | ICD-10-CM | POA: Diagnosis not present

## 2020-08-23 DIAGNOSIS — K922 Gastrointestinal hemorrhage, unspecified: Secondary | ICD-10-CM | POA: Insufficient documentation

## 2020-08-23 DIAGNOSIS — I82401 Acute embolism and thrombosis of unspecified deep veins of right lower extremity: Secondary | ICD-10-CM | POA: Insufficient documentation

## 2020-08-23 DIAGNOSIS — K766 Portal hypertension: Secondary | ICD-10-CM | POA: Insufficient documentation

## 2020-08-23 DIAGNOSIS — K219 Gastro-esophageal reflux disease without esophagitis: Secondary | ICD-10-CM | POA: Insufficient documentation

## 2020-08-23 DIAGNOSIS — Z885 Allergy status to narcotic agent status: Secondary | ICD-10-CM | POA: Diagnosis not present

## 2020-08-23 DIAGNOSIS — Z7984 Long term (current) use of oral hypoglycemic drugs: Secondary | ICD-10-CM | POA: Diagnosis not present

## 2020-08-23 DIAGNOSIS — R188 Other ascites: Secondary | ICD-10-CM

## 2020-08-23 DIAGNOSIS — E119 Type 2 diabetes mellitus without complications: Secondary | ICD-10-CM | POA: Diagnosis not present

## 2020-08-23 DIAGNOSIS — K7460 Unspecified cirrhosis of liver: Secondary | ICD-10-CM

## 2020-08-23 DIAGNOSIS — R197 Diarrhea, unspecified: Secondary | ICD-10-CM | POA: Insufficient documentation

## 2020-08-23 DIAGNOSIS — M1990 Unspecified osteoarthritis, unspecified site: Secondary | ICD-10-CM | POA: Insufficient documentation

## 2020-08-23 DIAGNOSIS — I10 Essential (primary) hypertension: Secondary | ICD-10-CM | POA: Insufficient documentation

## 2020-08-23 LAB — CBC WITH DIFFERENTIAL (CANCER CENTER ONLY)
Abs Immature Granulocytes: 0.01 10*3/uL (ref 0.00–0.07)
Basophils Absolute: 0 10*3/uL (ref 0.0–0.1)
Basophils Relative: 0 %
Eosinophils Absolute: 0.1 10*3/uL (ref 0.0–0.5)
Eosinophils Relative: 2 %
HCT: 35.8 % — ABNORMAL LOW (ref 36.0–46.0)
Hemoglobin: 10.6 g/dL — ABNORMAL LOW (ref 12.0–15.0)
Immature Granulocytes: 0 %
Lymphocytes Relative: 29 %
Lymphs Abs: 1.4 10*3/uL (ref 0.7–4.0)
MCH: 29.1 pg (ref 26.0–34.0)
MCHC: 29.6 g/dL — ABNORMAL LOW (ref 30.0–36.0)
MCV: 98.4 fL (ref 80.0–100.0)
Monocytes Absolute: 0.4 10*3/uL (ref 0.1–1.0)
Monocytes Relative: 8 %
Neutro Abs: 3 10*3/uL (ref 1.7–7.7)
Neutrophils Relative %: 61 %
Platelet Count: 132 10*3/uL — ABNORMAL LOW (ref 150–400)
RBC: 3.64 MIL/uL — ABNORMAL LOW (ref 3.87–5.11)
RDW: 18.6 % — ABNORMAL HIGH (ref 11.5–15.5)
WBC Count: 5 10*3/uL (ref 4.0–10.5)
nRBC: 0 % (ref 0.0–0.2)

## 2020-08-23 LAB — CMP (CANCER CENTER ONLY)
ALT: 10 U/L (ref 0–44)
AST: 27 U/L (ref 15–41)
Albumin: 3 g/dL — ABNORMAL LOW (ref 3.5–5.0)
Alkaline Phosphatase: 95 U/L (ref 38–126)
Anion gap: 4 — ABNORMAL LOW (ref 5–15)
BUN: 17 mg/dL (ref 8–23)
CO2: 25 mmol/L (ref 22–32)
Calcium: 8.6 mg/dL — ABNORMAL LOW (ref 8.9–10.3)
Chloride: 112 mmol/L — ABNORMAL HIGH (ref 98–111)
Creatinine: 1.67 mg/dL — ABNORMAL HIGH (ref 0.44–1.00)
GFR, Estimated: 31 mL/min — ABNORMAL LOW (ref >60)
Glucose, Bld: 113 mg/dL — ABNORMAL HIGH (ref 70–99)
Potassium: 5 mmol/L (ref 3.5–5.1)
Sodium: 141 mmol/L (ref 135–145)
Total Bilirubin: 0.5 mg/dL (ref 0.3–1.2)
Total Protein: 5.9 g/dL — ABNORMAL LOW (ref 6.5–8.1)

## 2020-08-23 LAB — SAMPLE TO BLOOD BANK

## 2020-08-23 NOTE — Progress Notes (Signed)
[facility] OFFICE PROGRESS NOTE  Brunetta Jeans, PA-C [address] [place]  [id number]  DIAGNOSIS: Hx of colon cancer, anemic secondary to GI blood loss.   CURRENT THERAPY: Observation  INTERVAL HISTORY: Cheryl Jordan 80 y.o. female returns to the clinic today for a follow-up visit.  The patient was last seen in the clinic on August 08, 2020.  The patient is currently on observation from her history of colorectal cancer.  She is up-to-date on her colonoscopy, her last being in December 2021.  Dr. Benay Spice is monitoring a liver lesion closely which may be concerning for [facility]. However, more recently/pressing, the patient's main concerns have been related to GI bleeding. The patient has a history of cirrhosis, esophageal varices, and portal hypertensive gastropathy.  She was hospitalized in December 2021 with GI bleeding.  She had previously been treated with Xarelto for history of DVT which was discontinued at her last appointment due to a risk/benefit discussion with the patient in light of her anemia and GI bleeding. She follows closely with Dr. Henrene Pastor from gastroenterology. We have been monitoring her anemia closely the last few weeks due to the anemia and GI bleeding. [facility] has been arranging for blood transfusions as needed. She received two IV iron infusions with Feraheme on 08/05/20 and 08/12/20 which she tolerated well. She also is taking her oral iron supplement as well and is complaint with this.   In the interval since her last appointment, the patient was experiencing "uncontrollable" diarrhea and a tight feeling in her abdomen.  She had a follow-up with gastroenterology who placed her on Flagyl, replace her albumin, and arrange for paracentesis which yielded 3 L of fluid.  This was performed on 08/19/2020.  Her diarrhea has resolved at this time.  Otherwise, the patient is feeling fairly well today.  She denies any abnormal bleeding or bruising, particularly, she denies  any melena or hematochezia.  She denies any nausea, vomiting, or constipation.  She denies any unexplained weight loss.  She denies any abdominal pain.  She is here today for evaluation and repeat blood work.  MEDICAL HISTORY: Past Medical History:  Diagnosis Date  . Allergy   . Anemia    hx of  . Anxiety   . Arthritis   . Colon cancer ([place]) dx'd 11/2014  . Diabetes mellitus without complication ([place])    TYPE II  . Fatty liver   . Gallbladder disease   . GERD (gastroesophageal reflux disease)   . History of gout   . Hypertension   . Hypothyroidism   . Kidney disorder    spot on left kidney  . Osteoporosis   . PONV (postoperative nausea and vomiting)     ALLERGIES:  is allergic to aspirin and codeine.  MEDICATIONS:  Current Outpatient Medications  Medication Sig Dispense Refill  . ACCU-CHEK AVIVA PLUS test strip     . acetaminophen (TYLENOL) 500 MG tablet Take 500 mg by mouth every 6 (six) hours as needed for moderate pain.    Marland Kitchen amLODipine (NORVASC) 2.5 MG tablet Take 1 tablet (2.5 mg total) by mouth daily. 90 tablet 2  . Blood Glucose Monitoring Suppl (ACCU-CHEK AVIVA) device Use as instructed 1 each 0  . ferrous sulfate 325 (65 FE) MG EC tablet Take 1 tablet (325 mg total) by mouth 2 (two) times daily with a meal. (Patient taking differently: Take 325 mg by mouth daily.) 90 tablet 1  . glipiZIDE (GLUCOTROL XL) 5 MG  24 hr tablet TAKE (1) TABLET DAILY WITH BREAKFAST. 90 tablet 0  . levothyroxine (SYNTHROID) 88 MCG tablet Take 1 tablet (88 mcg total) by mouth daily. 90 tablet 2  . loperamide (IMODIUM) 2 MG capsule Take 2 capsules by mouth once. Then take additional 63m capsule as needed for continued diarrhea. No more than 16 mg in 24 hours. 30 capsule 0  . LORazepam (ATIVAN) 0.5 MG tablet TAKE 1 TABLET TWICE DAILY AS NEEDED FOR ANXIETY 30 tablet 0  . metroNIDAZOLE (FLAGYL) 250 MG tablet Take 1 tablet (250 mg total) by mouth 3 (three) times daily. 21 tablet 0  . omeprazole  (PRILOSEC) 20 MG capsule Take 1 capsule (20 mg total) by mouth 2 (two) times daily before a meal. 60 capsule 1  . sucralfate (CARAFATE) 1 GM/10ML suspension Take 10 mLs (1 g total) by mouth 4 (four) times daily. 2400 mL 0  . telmisartan (MICARDIS) 40 MG tablet Take 0.5 tablets (20 mg total) by mouth in the morning and at bedtime.    . torsemide (DEMADEX) 20 MG tablet Take 1 tablet (20 mg total) by mouth daily. 30 tablet 1  . simethicone (GAS-X) 80 MG chewable tablet Chew 2 tablets (160 mg total) by mouth 4 (four) times daily for 5 days.  0   No current facility-administered medications for this visit.    SURGICAL HISTORY:  Past Surgical History:  Procedure Laterality Date  . BIOPSY  06/17/2020   Procedure: BIOPSY;  Surgeon: MRush LandmarkGTelford Nab, MD;  [facility];  Service: Gastroenterology;;  . CHOLECYSTECTOMY    . COLONOSCOPY WITH PROPOFOL N/A 06/17/2020   Procedure: COLONOSCOPY WITH PROPOFOL;  Surgeon: MRush LandmarkGTelford Nab, MD;  [facility];  Service: Gastroenterology;  Laterality: N/A;  . COLONOSCOPY WITH PROPOFOL N/A 07/01/2020   Procedure: COLONOSCOPY WITH PROPOFOL;  Surgeon: NMauri Pole MD;  [facility];  Service: Endoscopy;  Laterality: N/A;  . ESOPHAGOGASTRODUODENOSCOPY (EGD) WITH PROPOFOL N/A 06/17/2020   Procedure: ESOPHAGOGASTRODUODENOSCOPY (EGD) WITH PROPOFOL;  Surgeon: MRush LandmarkGTelford Nab, MD;  [facility];  Service: Gastroenterology;  Laterality: N/A;  . ESOPHAGOGASTRODUODENOSCOPY (EGD) WITH PROPOFOL N/A 07/01/2020   Procedure: ESOPHAGOGASTRODUODENOSCOPY (EGD) WITH PROPOFOL;  Surgeon: NMauri Pole MD;  [facility];  Service: Endoscopy;  Laterality: N/A;  . FOREIGN BODY REMOVAL  06/17/2020   Procedure: FOREIGN BODY REMOVAL;  Surgeon: MRush LandmarkGTelford Nab, MD;  [facility];  Service: Gastroenterology;;  . GI RADIOFREQUENCY ABLATION  06/17/2020   Procedure: GI RADIOFREQUENCY ABLATION;   Surgeon: MIrving Copas, MD;  [facility];  Service: Gastroenterology;;  . HEMOSTASIS CLIP PLACEMENT  06/17/2020   Procedure: HEMOSTASIS CLIP PLACEMENT;  Surgeon: MIrving Copas, MD;  [facility];  Service: Gastroenterology;;  . HEMOSTASIS CLIP PLACEMENT  07/01/2020   Procedure: HEMOSTASIS CLIP PLACEMENT;  Surgeon: NMauri Pole MD;  [facility];  Service: Endoscopy;;  . HOT HEMOSTASIS N/A 07/01/2020   Procedure: HOT HEMOSTASIS (ARGON PLASMA COAGULATION/BICAP);  Surgeon: NMauri Pole MD;  [facility];  Service: Endoscopy;  Laterality: N/A;  . IR PARACENTESIS  08/19/2020  . IR RADIOLOGIST EVAL & MGMT  01/24/2017  . KNEE CARTILAGE SURGERY Bilateral   . NEPHRECTOMY Left 02/10/2015   Procedure: OPEN RETROPERINTONEAL EXPLORATION LEFT RENAL  CYST DECORTICATION X 5;  Surgeon: PCleon Gustin MD;  [facility];  Service: Urology;  Laterality: Left;  . PARTIAL COLECTOMY N/A 02/10/2015   Procedure: OPEN RIGHT  COLECTOMY ;  Surgeon: TArmandina Gemma MD;  [facility];  Service: General;  Laterality: N/A;  . POLYPECTOMY  06/17/2020   Procedure: POLYPECTOMY;  Surgeon: Mansouraty, Telford Nab., MD;  [facility];  Service: Gastroenterology;;  . TUBAL LIGATION      REVIEW OF SYSTEMS:   Review of Systems  Constitutional: Negative for appetite change, chills, fatigue, fever and unexpected weight change.  HENT: Negative for mouth sores, nosebleeds, sore throat and trouble swallowing.   Eyes: Negative for eye problems and icterus.  Respiratory: Negative for cough, hemoptysis, shortness of breath and wheezing.   Cardiovascular: Negative for chest pain and leg swelling.  Gastrointestinal: Negative for abdominal pain, constipation, diarrhea, nausea and vomiting.  Genitourinary: Negative for bladder incontinence, difficulty urinating, dysuria, frequency and hematuria.   Musculoskeletal: Negative for back pain, gait problem,  neck pain and neck stiffness.  Skin: Negative for itching and rash.  Neurological: Negative for dizziness, extremity weakness, gait problem, headaches, light-headedness and seizures.  Hematological: Negative for adenopathy. Does not bruise/bleed easily.  Psychiatric/Behavioral: Negative for confusion, depression and sleep disturbance. The patient is not nervous/anxious.     PHYSICAL EXAMINATION:  Blood pressure (!) 146/62, pulse 68, temperature 98.1 F (36.7 C), temperature source Tympanic, resp. rate 19, height 5' 7"  (1.702 m), weight 214 lb 6.4 oz (97.3 kg), SpO2 98 %.  ECOG PERFORMANCE STATUS: 1 - Symptomatic but completely ambulatory  Physical Exam  Constitutional: Oriented to person, place, and time and well-developed, well-nourished, and in no distress.  HENT:  Head: Normocephalic and atraumatic.  Mouth/Throat: Oropharynx is clear and moist. No oropharyngeal exudate.  Eyes: Conjunctivae are normal. Right eye exhibits no discharge. Left eye exhibits no discharge. No scleral icterus.  Neck: Normal range of motion. Neck supple.  Cardiovascular: Normal rate, regular rhythm, normal heart sounds and intact distal pulses.  Pulmonary/Chest: Effort normal and breath sounds normal. No respiratory distress. No wheezes. No rales.  Abdominal: Tight distended abdomen. bowel sounds are normal. There is no tenderness.  Musculoskeletal: Pitting edema bilaterally.  Normal range of motion.   Lymphadenopathy:    No cervical adenopathy.  Neurological: Alert and oriented to person, place, and time. Exhibits normal muscle tone. Gait normal. Coordination normal.  Skin: Skin is warm and dry. No rash noted. Not diaphoretic. No erythema. No pallor.  Psychiatric: Mood, memory and judgment normal.  Vitals reviewed.  LABORATORY DATA: Lab Results  Component Value Date   WBC 5.0 08/23/2020   HGB 10.6 (L) 08/23/2020   HCT 35.8 (L) 08/23/2020   MCV 98.4 08/23/2020   PLT 132 (L) 08/23/2020      Chemistry       Component Value Date/Time   NA 141 08/23/2020 1107   NA 141 02/07/2017 0942   K 5.0 08/23/2020 1107   K 4.4 02/07/2017 0942   CL 112 (H) 08/23/2020 1107   CO2 25 08/23/2020 1107   CO2 25 02/07/2017 0942   BUN 17 08/23/2020 1107   BUN 18.2 02/07/2017 0942   CREATININE 1.67 (H) 08/23/2020 1107   CREATININE 1.2 (H) 02/07/2017 0942      Component Value Date/Time   CALCIUM 8.6 (L) 08/23/2020 1107   CALCIUM 9.6 02/07/2017 0942   ALKPHOS 95 08/23/2020 1107   ALKPHOS 103 02/07/2017 0942   AST 27 08/23/2020 1107   AST 42 (H) 02/07/2017 0942   ALT 10 08/23/2020 1107   ALT 37 02/07/2017 0942   BILITOT 0.5 08/23/2020 1107   BILITOT 0.74 02/07/2017 0942       RADIOGRAPHIC STUDIES:  VAS [place]  LOWER EXTREMITY VENOUS (DVT)  Result Date: 08/04/2020  Lower Venous DVT Study Indications: Follow up exam for DVT. Other Indications: Swelling. Comparison Study: 05/11/20 -Positive DVT (RT PTVs) Performing Technologist: Rogelia Rohrer  Examination Guidelines: A complete evaluation includes B-mode imaging, spectral Doppler, color Doppler, and power Doppler as needed of all accessible portions of each vessel. Bilateral testing is considered an integral part of a complete examination. Limited examinations for reoccurring indications may be performed as noted. The reflux portion of the exam is performed with the patient in reverse Trendelenburg.  +---------+---------------+---------+-----------+-----------------+------------+ RIGHT    CompressibilityPhasicitySpontaneityProperties       Thrombus                                                                  Aging        +---------+---------------+---------+-----------+-----------------+------------+ CFV      Full           Yes      Yes                                      +---------+---------------+---------+-----------+-----------------+------------+ SFJ      Full                                                              +---------+---------------+---------+-----------+-----------------+------------+ FV Prox  Full           Yes      Yes                                      +---------+---------------+---------+-----------+-----------------+------------+ FV Mid   Full           Yes      Yes                                      +---------+---------------+---------+-----------+-----------------+------------+ FV DistalFull           Yes      Yes                                      +---------+---------------+---------+-----------+-----------------+------------+ PFV      Full                                                             +---------+---------------+---------+-----------+-----------------+------------+ POP      Full           Yes      Yes                                      +---------+---------------+---------+-----------+-----------------+------------+  PTV      Partial                            partially        Chronic                                                  re-cannalized                 +---------+---------------+---------+-----------+-----------------+------------+ PERO     Full                                                             +---------+---------------+---------+-----------+-----------------+------------+ One of paired posterior tibial veins recannalized, other shows chronic DVT.  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 - There is no evidence of superficial venous thrombosis.  - No cystic structure found in the popliteal fossa.   LEFT: - No evidence of common femoral vein obstruction.  *See table(s) above for measurements and observations. Electronically signed by Servando Snare MD on 08/04/2020 at 2:26:29 PM.    Final    IR Paracentesis  Result Date: 08/19/2020 INDICATION: History of cirrhosis with recurrent ascites. Request for diagnostic and therapeutic paracentesis. EXAM: ULTRASOUND GUIDED RIGHT LOWER QUADRANT PARACENTESIS MEDICATIONS: 1% plain lidocaine, 10 mL COMPLICATIONS: None immediate. PROCEDURE: Informed written consent was obtained from the patient after a discussion of the risks, benefits and alternatives to treatment. A timeout was performed prior to the initiation of the procedure. Initial ultrasound scanning demonstrates a large amount of ascites within the right lower abdominal quadrant. The right lower abdomen was prepped and draped in the usual sterile fashion. 1% lidocaine was used for local anesthesia. Following this, a 19 gauge, 7-cm, Yueh catheter was introduced. An ultrasound image was saved for documentation purposes. The paracentesis was performed. The catheter was removed and a dressing was applied. The patient tolerated the procedure well without immediate post procedural complication. Patient received post-procedure intravenous albumin; see nursing notes for details. FINDINGS: A total of approximately 3 L of clear yellow fluid was removed. Samples were sent to the laboratory as requested by the clinical team. IMPRESSION: Successful ultrasound-guided paracentesis yielding 3 liters of peritoneal fluid. Read by: Ascencion Dike PA-C Electronically Signed   By: Aletta Edouard M.D.   On: 08/19/2020 09:52     ASSESSMENT/PLAN:  1. Stage IIIc (T4b,N2b) moderately differentiated adenocarcinoma the cecum, status post a right colectomy 02/10/2015, tumor invades through the serosa and into adjacent small bowel, lymphovascular and perineural invasion present, resection margins negative, 8 of 20 lymph nodes positive for  metastatic carcinoma. ? Loss of MLH1 and PMS2 expression ? Microsatellite instability-high ? CT chest 03/03/2015-negative for metastatic disease ? Cycle 1 adjuvant Xeloda 03/18/2015 ?  Cycle 2 adjuvant Xeloda 04/09/2015 ? Cycle 3 adjuvant Xeloda 04/30/2015 ? Cycle 4 adjuvant Xeloda 05/21/2015 ? Cycle 5 adjuvant Xeloda 06/11/2015 (dose reduced due to desquamation of feet). ? Cycle 6 adjuvant Xeloda 07/02/2015 ? Cycle 7 adjuvant Xeloda 07/23/2015 ? Cycle 8 adjuvant Xeloda 08/13/2015 ? CTs chest, abdomen, and pelvis 02/06/2016-cirrhosis, heterogenous enhancement of the liver, small hypoenhancing lesions in the liver-? 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 Likely regenerative nodules in the liver;does not appear as HCCor metastases. Left renal mass very slow growing. CT scan recommended in one year) ? Colonoscopy 08/06/2016-multiple 5-15 mm very subtle sessile serrated adenomasfound in the transverse colon and at the anastomosis. Next colonoscopy at a one-year interval. ? CT 12/23/2017-no evidence of recurrent colon cancer, cirrhotic liver with no dominant mass, left renal mass-cystic component mildly larger compared to 2017 ? Colonoscopy 06/17/2020-hyperplastic polyps and a tubular adenoma removed from the rectum and colon  2.Anemia, microcytic. Likely iron deficiencyand ongoing GI blood loss progressive anemia, status post red cell transfusion 06/15/2020, 06/30/2020, 07/26/2020  2. 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 She is followed by Dr. Alyson Ingles, urology.Unchanged on CT 02/06/2016, unchanged on MRI 12/06/2016, cystic component slightly larger on CT 12/23/2017 3. Diabetes 4. Hypertension 5. History of mild hand/foot syndrome secondary to Xeloda 6. Enlarging segment 8 liver lesion noted on MRI 12/06/2016-felt to potentially represent a metastasis or HCC  Ultrasound-guided biopsy of the lesion on  02/06/2017 revealed cirrhosis, no malignancy  CT 12/23/2017-chronic liver nodularity, no dominant mass, changes of cirrhosis  CT 04/29/2020-enlarging right liver lesion concerning for [facility], separate from the lesion biopsied in 2018 8. Cirrhosis 9. History of mild thrombocytopenia-likely secondary to cirrhosis 10. 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  She has cirrhosis and GI bleeding which we are monitoring her blood counts closely.  The patient had two iron infusions recently which she tolerated well.  The patient's labs today show improved but persistent anemia with a hemoglobin of 10.6.  No blood transfusion needed today.  The patient denies any melena or hematochezia.  She denies any hematemesis.  She continues to take her oral iron supplement which she is compliant with.  Xarelto was discontinued on 08/03/2020 in the setting of her recent GI blood loss.  Discussed her labs with Dr. Benay Spice.  We will arrange her follow-up visit in 3 to 4 weeks with a repeat CBC, sample to blood bank, and ferritin.   The patient is aware of signs and symptoms of GI bleeding.  She is also aware of when she is in need of another paracentesis for her ascites.  She knows to call gastroenterology or our office if she feels worsening abdominal distention.   Orders Placed This Encounter  Procedures  . Ferritin    Standing Status:   Future    Standing Expiration Date:   08/23/2021  . CBC with Differential (Cancer Center Only)    Standing Status:   Future    Standing Expiration Date:   08/23/2021  . Sample to Blood Bank    Standing Status:   Future    Standing Expiration Date:   08/23/2021     I spent 20-29 minutes in this encounter.   Cheryl Jordan L Kyrel Leighton, PA-C 08/23/20

## 2020-08-24 LAB — CLOSTRIDIUM DIFFICILE BY PCR: Toxigenic C. Difficile by PCR: NEGATIVE

## 2020-08-26 NOTE — Telephone Encounter (Signed)
Inbound call from patient returning your call. 

## 2020-08-26 NOTE — Telephone Encounter (Signed)
See result note.  

## 2020-09-20 ENCOUNTER — Inpatient Hospital Stay: Payer: Medicare Other | Attending: Oncology | Admitting: Oncology

## 2020-09-20 ENCOUNTER — Other Ambulatory Visit: Payer: Self-pay

## 2020-09-20 ENCOUNTER — Inpatient Hospital Stay: Payer: Medicare Other

## 2020-09-20 VITALS — BP 152/59 | HR 71 | Temp 98.2°F | Resp 18 | Ht 67.0 in | Wt 221.2 lb

## 2020-09-20 DIAGNOSIS — R140 Abdominal distension (gaseous): Secondary | ICD-10-CM | POA: Insufficient documentation

## 2020-09-20 DIAGNOSIS — D62 Acute posthemorrhagic anemia: Secondary | ICD-10-CM

## 2020-09-20 DIAGNOSIS — R110 Nausea: Secondary | ICD-10-CM | POA: Diagnosis not present

## 2020-09-20 DIAGNOSIS — I10 Essential (primary) hypertension: Secondary | ICD-10-CM | POA: Insufficient documentation

## 2020-09-20 DIAGNOSIS — C189 Malignant neoplasm of colon, unspecified: Secondary | ICD-10-CM

## 2020-09-20 DIAGNOSIS — D509 Iron deficiency anemia, unspecified: Secondary | ICD-10-CM | POA: Insufficient documentation

## 2020-09-20 DIAGNOSIS — E119 Type 2 diabetes mellitus without complications: Secondary | ICD-10-CM | POA: Diagnosis not present

## 2020-09-20 DIAGNOSIS — L271 Localized skin eruption due to drugs and medicaments taken internally: Secondary | ICD-10-CM | POA: Insufficient documentation

## 2020-09-20 DIAGNOSIS — R6881 Early satiety: Secondary | ICD-10-CM | POA: Diagnosis not present

## 2020-09-20 DIAGNOSIS — C180 Malignant neoplasm of cecum: Secondary | ICD-10-CM | POA: Diagnosis present

## 2020-09-20 DIAGNOSIS — K7469 Other cirrhosis of liver: Secondary | ICD-10-CM | POA: Insufficient documentation

## 2020-09-20 DIAGNOSIS — Z9049 Acquired absence of other specified parts of digestive tract: Secondary | ICD-10-CM | POA: Diagnosis not present

## 2020-09-20 DIAGNOSIS — Z79899 Other long term (current) drug therapy: Secondary | ICD-10-CM | POA: Insufficient documentation

## 2020-09-20 DIAGNOSIS — N2889 Other specified disorders of kidney and ureter: Secondary | ICD-10-CM | POA: Insufficient documentation

## 2020-09-20 DIAGNOSIS — Z86718 Personal history of other venous thrombosis and embolism: Secondary | ICD-10-CM | POA: Diagnosis not present

## 2020-09-20 DIAGNOSIS — I82401 Acute embolism and thrombosis of unspecified deep veins of right lower extremity: Secondary | ICD-10-CM | POA: Insufficient documentation

## 2020-09-20 DIAGNOSIS — T451X5A Adverse effect of antineoplastic and immunosuppressive drugs, initial encounter: Secondary | ICD-10-CM | POA: Insufficient documentation

## 2020-09-20 LAB — CBC WITH DIFFERENTIAL (CANCER CENTER ONLY)
Abs Immature Granulocytes: 0.01 10*3/uL (ref 0.00–0.07)
Basophils Absolute: 0 10*3/uL (ref 0.0–0.1)
Basophils Relative: 1 %
Eosinophils Absolute: 0.1 10*3/uL (ref 0.0–0.5)
Eosinophils Relative: 2 %
HCT: 33.2 % — ABNORMAL LOW (ref 36.0–46.0)
Hemoglobin: 10.4 g/dL — ABNORMAL LOW (ref 12.0–15.0)
Immature Granulocytes: 0 %
Lymphocytes Relative: 29 %
Lymphs Abs: 1.3 10*3/uL (ref 0.7–4.0)
MCH: 30.1 pg (ref 26.0–34.0)
MCHC: 31.3 g/dL (ref 30.0–36.0)
MCV: 96 fL (ref 80.0–100.0)
Monocytes Absolute: 0.3 10*3/uL (ref 0.1–1.0)
Monocytes Relative: 6 %
Neutro Abs: 2.7 10*3/uL (ref 1.7–7.7)
Neutrophils Relative %: 62 %
Platelet Count: 119 10*3/uL — ABNORMAL LOW (ref 150–400)
RBC: 3.46 MIL/uL — ABNORMAL LOW (ref 3.87–5.11)
RDW: 16.4 % — ABNORMAL HIGH (ref 11.5–15.5)
WBC Count: 4.3 10*3/uL (ref 4.0–10.5)
nRBC: 0 % (ref 0.0–0.2)

## 2020-09-20 LAB — FERRITIN: Ferritin: 91 ng/mL (ref 11–307)

## 2020-09-20 LAB — SAMPLE TO BLOOD BANK

## 2020-09-20 NOTE — Progress Notes (Signed)
[facility] OFFICE PROGRESS NOTE   Diagnosis: Anemia, history of colon cancer, cirrhosis  INTERVAL HISTORY:   [name] returns as scheduled.  She received IV iron on 08/05/2020 and 08/12/2020.  She complains of increased abdominal distention.  She relates mild nausea and early satiety to the distended abdomen.  She is scheduled to see Dr. Henrene Pastor tomorrow.  No bleeding.  Objective:  Vital signs in last 24 hours:  Blood pressure (!) 152/59, pulse 71, temperature 98.2 F (36.8 C), temperature source Tympanic, resp. rate 18, height 5' 7"  (1.702 m), weight 221 lb 3.2 oz (100.3 kg), SpO2 97 %.    HEENT: Sclera anicteric Resp: Coarse inspiratory rhonchi at the left lower posterior chest, no respiratory distress Cardio: Regular rate and rhythm GI: The abdomen is distended with ascites.  No hepatosplenomegaly Vascular: Pitting edema below the knee bilaterally  Lab Results:  Lab Results  Component Value Date   WBC 4.3 09/20/2020   HGB 10.4 (L) 09/20/2020   HCT 33.2 (L) 09/20/2020   MCV 96.0 09/20/2020   PLT 119 (L) 09/20/2020   NEUTROABS 2.7 09/20/2020    CMP  Lab Results  Component Value Date   NA 141 08/23/2020   K 5.0 08/23/2020   CL 112 (H) 08/23/2020   CO2 25 08/23/2020   GLUCOSE 113 (H) 08/23/2020   BUN 17 08/23/2020   CREATININE 1.67 (H) 08/23/2020   CALCIUM 8.6 (L) 08/23/2020   PROT 5.9 (L) 08/23/2020   ALBUMIN 3.0 (L) 08/23/2020   AST 27 08/23/2020   ALT 10 08/23/2020   ALKPHOS 95 08/23/2020   BILITOT 0.5 08/23/2020   GFRNONAA 31 (L) 08/23/2020   GFRAA 59 (L) 11/17/2017    Lab Results  Component Value Date   CEA1 3.24 06/30/2020   09/20/2020: Ferritin-91  Medications: I have reviewed the patient's current medications.   Assessment/Plan:  1. Stage IIIc (T4b,N2b) moderately differentiated adenocarcinoma the cecum, status post a right colectomy 02/10/2015, tumor invades through the serosa and into adjacent small bowel, lymphovascular and  perineural invasion present, resection margins negative, 8 of 20 lymph nodes positive for metastatic carcinoma. ? Loss of MLH1 and PMS2 expression ? Microsatellite instability-high ? CT chest 03/03/2015-negative for metastatic disease ? Cycle 1 adjuvant Xeloda 03/18/2015 ? Cycle 2 adjuvant Xeloda 04/09/2015 ? Cycle 3 adjuvant Xeloda 04/30/2015 ? Cycle 4 adjuvant Xeloda 05/21/2015 ? Cycle 5 adjuvant Xeloda 06/11/2015 (dose reduced due to desquamation of feet). ? Cycle 6 adjuvant Xeloda 07/02/2015 ? Cycle 7 adjuvant Xeloda 07/23/2015 ? Cycle 8 adjuvant Xeloda 08/13/2015 ? CTs chest, abdomen, and pelvis 02/06/2016-cirrhosis, heterogenous enhancement of the liver, small hypoenhancing lesions in the liver-? 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 Likely regenerative nodules in the liver;does not appear as HCCor metastases. Left renal mass very slow growing. CT scan recommended in one year) ? Colonoscopy 08/06/2016-multiple 5-15 mm very subtle sessile serrated adenomasfound in the transverse colon and at the anastomosis. Next colonoscopy at a one-year interval. ? CT 12/23/2017-no evidence of recurrent colon cancer, cirrhotic liver with no dominant mass, left renal mass-cystic component mildly larger compared to 2017 ? Colonoscopy 06/17/2020-hyperplastic polyps and a tubular adenoma removed from the rectum and colon  2. Anemia, microcytic. Likely iron deficiency and ongoing GI blood loss progressive anemia, status post  red cell transfusion 06/15/2020, 06/30/2020, 07/26/2020, 08/05/2020  IV iron 08/05/2020 and 08/12/2020  2. 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 She is followed by Dr. Alyson Ingles, urology.Unchanged on CT  02/06/2016, unchanged on MRI 12/06/2016, cystic component slightly larger on CT 12/23/2017 3. Diabetes 4. Hypertension 5. History of mild hand/foot syndrome secondary to Xeloda 6. Enlarging segment  8 liver lesion noted on MRI 12/06/2016-felt to potentially represent a metastasis or HCC  Ultrasound-guided biopsy of the lesion on 02/06/2017 revealed cirrhosis, no malignancy  CT 12/23/2017-chronic liver nodularity, no dominant mass, changes of cirrhosis  CT 04/29/2020- enlarging right liver lesion concerning for [facility], separate from the lesion biopsied in 2018 8. Cirrhosis 9. History of mild thrombocytopenia-likely secondary to cirrhosis 10. 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 appears stable.  The hemoglobin has improved following the IV iron given in January.  She will continue oral iron.  She will return for an office visit and CBC in 1 month.  She is scheduled follow-up with Dr. Henrene Pastor tomorrow.  We will consider a repeat CT abdomen within the next 3-4 months to follow-up on the enlarging right liver lesion.  [name], MD  09/20/2020  12:51 PM

## 2020-09-21 ENCOUNTER — Ambulatory Visit: Payer: Medicare Other | Admitting: Internal Medicine

## 2020-09-21 ENCOUNTER — Other Ambulatory Visit (INDEPENDENT_AMBULATORY_CARE_PROVIDER_SITE_OTHER): Payer: Medicare Other

## 2020-09-21 ENCOUNTER — Encounter: Payer: Self-pay | Admitting: Internal Medicine

## 2020-09-21 VITALS — BP 140/72 | HR 73 | Ht 67.0 in | Wt 222.8 lb

## 2020-09-21 DIAGNOSIS — Z85038 Personal history of other malignant neoplasm of large intestine: Secondary | ICD-10-CM

## 2020-09-21 DIAGNOSIS — R188 Other ascites: Secondary | ICD-10-CM

## 2020-09-21 DIAGNOSIS — K219 Gastro-esophageal reflux disease without esophagitis: Secondary | ICD-10-CM | POA: Diagnosis not present

## 2020-09-21 DIAGNOSIS — K7469 Other cirrhosis of liver: Secondary | ICD-10-CM | POA: Diagnosis not present

## 2020-09-21 DIAGNOSIS — K769 Liver disease, unspecified: Secondary | ICD-10-CM

## 2020-09-21 DIAGNOSIS — D509 Iron deficiency anemia, unspecified: Secondary | ICD-10-CM

## 2020-09-21 LAB — COMPREHENSIVE METABOLIC PANEL
ALT: 11 U/L (ref 0–35)
AST: 25 U/L (ref 0–37)
Albumin: 3.2 g/dL — ABNORMAL LOW (ref 3.5–5.2)
Alkaline Phosphatase: 113 U/L (ref 39–117)
BUN: 18 mg/dL (ref 6–23)
CO2: 28 mEq/L (ref 19–32)
Calcium: 8.9 mg/dL (ref 8.4–10.5)
Chloride: 107 mEq/L (ref 96–112)
Creatinine, Ser: 1.45 mg/dL — ABNORMAL HIGH (ref 0.40–1.20)
GFR: 34.51 mL/min — ABNORMAL LOW (ref >60.00)
Glucose, Bld: 100 mg/dL — ABNORMAL HIGH (ref 70–99)
Potassium: 4.5 mEq/L (ref 3.5–5.1)
Sodium: 141 mEq/L (ref 135–145)
Total Bilirubin: 0.7 mg/dL (ref 0.2–1.2)
Total Protein: 6.5 g/dL (ref 6.0–8.3)

## 2020-09-21 MED ORDER — FUROSEMIDE 40 MG PO TABS: 60.0000 mg | ORAL_TABLET | Freq: Every day | ORAL | 6 refills | Status: DC

## 2020-09-21 NOTE — Progress Notes (Signed)
HISTORY OF PRESENT ILLNESS:  Cheryl Jordan is a 80 y.o. 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  She also has a history of multifactorial anemia, history of DVT for which she was on Xarelto, recent colonoscopy and upper endoscopy for GI bleeding, hyperkalemia for which Aldactone was held, renal insufficiency, history of colon cancer, chronic nausea.  Last saw the patient July 25, 2020.  She presents today for routine follow-up.  Since that time she is undergone interval paracentesis of 3 L.  Her only diuretic is Lasix 40 mg daily.  She did see hematology oncology yesterday.  I have reviewed that visit.  Review of lab work from yesterday shows hemoglobin 10.4.  She tells me that she is feeling much better overall.  Better strength.  Her chief complaint is progressive weight gain and progressive abdominal distention consistent with recurrent ascites.  She is uncomfortable.  No pain or fever.  She also has ankle edema.  Her anticoagulation was discontinued.  Despite interval paracentesis she has gained 10 pounds since her last visit.  REVIEW OF SYSTEMS:  All non-GI ROS negative as otherwise stated in the HPI except for anxiety, heart murmur  Past Medical History:  Diagnosis Date  . Allergy   . Anemia    hx of  . Anxiety   . Arthritis   . Colon cancer ([place]) dx'd 11/2014  . Diabetes mellitus without complication ([place])    TYPE II  . Fatty liver   . Gallbladder disease   . GERD (gastroesophageal reflux disease)   . History of gout   . Hypertension   . Hypothyroidism   . Kidney disorder    spot on left kidney  . Osteoporosis   . PONV (postoperative nausea and vomiting)     Past Surgical History:  Procedure Laterality Date  . BIOPSY  06/17/2020   Procedure: BIOPSY;  Surgeon: Rush Landmark Telford Nab., MD;  Location: [facility];  Service: Gastroenterology;;  .  CHOLECYSTECTOMY    . COLONOSCOPY WITH PROPOFOL N/A 06/17/2020   Procedure: COLONOSCOPY WITH PROPOFOL;  Surgeon: Rush Landmark Telford Nab., MD;  Location: [facility];  Service: Gastroenterology;  Laterality: N/A;  . COLONOSCOPY WITH PROPOFOL N/A 07/01/2020   Procedure: COLONOSCOPY WITH PROPOFOL;  Surgeon: Mauri Pole, MD;  Location: [facility];  Service: Endoscopy;  Laterality: N/A;  . ESOPHAGOGASTRODUODENOSCOPY (EGD) WITH PROPOFOL N/A 06/17/2020   Procedure: ESOPHAGOGASTRODUODENOSCOPY (EGD) WITH PROPOFOL;  Surgeon: Rush Landmark Telford Nab., MD;  Location: [facility];  Service: Gastroenterology;  Laterality: N/A;  . ESOPHAGOGASTRODUODENOSCOPY (EGD) WITH PROPOFOL N/A 07/01/2020   Procedure: ESOPHAGOGASTRODUODENOSCOPY (EGD) WITH PROPOFOL;  Surgeon: Mauri Pole, MD;  Location: [facility];  Service: Endoscopy;  Laterality: N/A;  . FOREIGN BODY REMOVAL  06/17/2020   Procedure: FOREIGN BODY REMOVAL;  Surgeon: Rush Landmark Telford Nab., MD;  Location: [facility];  Service: Gastroenterology;;  . GI RADIOFREQUENCY ABLATION  06/17/2020   Procedure: GI RADIOFREQUENCY ABLATION;  Surgeon: Irving Copas., MD;  Location: [facility];  Service: Gastroenterology;;  . HEMOSTASIS CLIP PLACEMENT  06/17/2020   Procedure: HEMOSTASIS CLIP PLACEMENT;  Surgeon: Irving Copas., MD;  Location: [facility];  Service: Gastroenterology;;  . HEMOSTASIS CLIP PLACEMENT  07/01/2020   Procedure: HEMOSTASIS CLIP PLACEMENT;  Surgeon: Mauri Pole, MD;  Location: [facility];  Service: Endoscopy;;  . HOT HEMOSTASIS N/A 07/01/2020   Procedure: HOT HEMOSTASIS (ARGON PLASMA COAGULATION/BICAP);  Surgeon: Harl Bowie  V, MD;  Location: [facility];  Service: Endoscopy;  Laterality: N/A;  . IR PARACENTESIS  08/19/2020  . IR RADIOLOGIST EVAL & MGMT  01/24/2017  . KNEE CARTILAGE SURGERY Bilateral   . NEPHRECTOMY Left 02/10/2015   Procedure: OPEN RETROPERINTONEAL EXPLORATION LEFT RENAL  CYST  DECORTICATION X 5;  Surgeon: Cleon Gustin, MD;  Location: [facility];  Service: Urology;  Laterality: Left;  . PARTIAL COLECTOMY N/A 02/10/2015   Procedure: OPEN RIGHT  COLECTOMY ;  Surgeon: [name], MD;  Location: [facility];  Service: General;  Laterality: N/A;  . POLYPECTOMY  06/17/2020   Procedure: POLYPECTOMY;  Surgeon: Rush Landmark Telford Nab., MD;  Location: [facility];  Service: Gastroenterology;;  . TUBAL LIGATION      Social History Cheryl Jordan  reports that she has never smoked. She has never used smokeless tobacco. She reports that she does not drink alcohol and does not use drugs.  family history includes COPD in her brother, brother, and father; Diabetes in her mother; GER disease in her sister; Heart attack in her brother; Hypertension in her brother, sister, and another family member; Lung cancer in her brother.  Allergies  Allergen Reactions  . Aspirin Other (See Comments)    Runny nose   . Codeine Nausea And Vomiting       PHYSICAL EXAMINATION: Vital signs: BP 140/72   Pulse 73   Ht 5\' 7"  (1.702 m)   Wt 222 lb 12.8 oz (101.1 kg)   SpO2 98%   BMI 34.90 kg/m   Constitutional: generally well-appearing, no acute distress Psychiatric: alert and oriented x3, cooperative Eyes: extraocular movements intact, anicteric, conjunctiva pink Mouth: oral pharynx moist, no lesions Neck: supple no lymphadenopathy Cardiovascular: heart regular rate and rhythm. Lungs: clear to auscultation bilaterally Abdomen: soft, nontender, distended with obvious ascites, no peritoneal signs, normal bowel sounds, no organomegaly Rectal: Omitted Extremities: no clubbing or cyanosis.  2+ lower extremity edema bilaterally Skin: no lesions on visible extremities Neuro: No focal deficits. No asterixis.    ASSESSMENT:  1.  NASH cirrhosis with portal hypertension. 2.  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  Last upper endoscopy December  2021 3.  Ascites.  Currently symptomatic. 4.  2.8 x 2.5 cm right liver lesion.  Possibly [place].  Dr. Benay Spice plans follow-up imaging 5.  History of DVT.  Now off Xarelto 6.  Colon cancer.  Most recent colonoscopy December 2021.  Unremarkable postoperative exam 7.  Chronic nausea 8.  Chronic renal insufficiency  PLAN:  1.  Comprehensive metabolic panel today 2.  Increase Lasix to 60 mg daily 3.  Repeat basic metabolic panel in 2 weeks 4.  Ultrasound-guided paracentesis up to 5 L.  IV albumin replacement 5.  Send fluid for cell count with differential 6.  Routine GI office follow-up 3 months A total time of 40 minutes was spent preparing to see the patient, taking comprehensive history, performing comprehensive physical exam, counseling the patient regarding her multiple above listed issues, ordering therapeutic paracentesis, changing medications, ordering laboratory studies, and documenting clinical information in the health record

## 2020-09-21 NOTE — Patient Instructions (Signed)
Your provider has requested that you go to the basement level for lab work before leaving today. Press "B" on the elevator. The lab is located at the first door on the left as you exit the elevator.  Your provider has requested that you go to the basement level for lab work in two weeks.   We have sent the following medications to your pharmacy for you to pick up at your convenience:  Lasix - increase to 60mg  daily.  You have been scheduled for an abdominal paracentesis at [facility] radiology (1st floor of hospital) on 09/26/2020 at 10:00am. Please arrive at least 15 minutes prior to your appointment time for registration. Should you need to reschedule this appointment for any reason, please call our office at [phone].  Please follow up in 3 months

## 2020-09-22 ENCOUNTER — Other Ambulatory Visit: Payer: Self-pay

## 2020-09-22 DIAGNOSIS — K7469 Other cirrhosis of liver: Secondary | ICD-10-CM

## 2020-09-26 ENCOUNTER — Ambulatory Visit (HOSPITAL_COMMUNITY)
Admission: RE | Admit: 2020-09-26 | Discharge: 2020-09-26 | Disposition: A | Discharge status: Home / Self Care | Payer: Medicare Other | Source: Ambulatory Visit | Attending: Internal Medicine | Admitting: Internal Medicine

## 2020-09-26 ENCOUNTER — Other Ambulatory Visit: Payer: Self-pay

## 2020-09-26 DIAGNOSIS — K7469 Other cirrhosis of liver: Secondary | ICD-10-CM | POA: Diagnosis not present

## 2020-09-26 DIAGNOSIS — R188 Other ascites: Secondary | ICD-10-CM | POA: Diagnosis not present

## 2020-09-26 HISTORY — PX: IR PARACENTESIS: IMG2679

## 2020-09-26 LAB — BODY FLUID CELL COUNT WITH DIFFERENTIAL
Eos, Fluid: 0 %
Lymphs, Fluid: 80 %
Monocyte-Macrophage-Serous Fluid: 4 % — ABNORMAL LOW (ref 50–90)
Neutrophil Count, Fluid: 16 % (ref 0–25)
Total Nucleated Cell Count, Fluid: 380 cu mm (ref 0–1000)

## 2020-09-26 MED ORDER — LIDOCAINE HCL (PF) 1 % IJ SOLN
INTRAMUSCULAR | Status: AC | PRN
  Administered 2020-09-26: 10 mL

## 2020-09-26 NOTE — Procedures (Signed)
Ultrasound-guided diagnostic and therapeutic paracentesis performed yielding 4 liters of straw colored fluid.  Fluid was sent to lab for analysis. No immediate complications. EBL is none.

## 2020-09-27 LAB — PATHOLOGIST SMEAR REVIEW: Path Review: INCREASED

## 2020-10-13 ENCOUNTER — Other Ambulatory Visit (INDEPENDENT_AMBULATORY_CARE_PROVIDER_SITE_OTHER): Payer: Medicare Other

## 2020-10-13 DIAGNOSIS — K7469 Other cirrhosis of liver: Secondary | ICD-10-CM | POA: Diagnosis not present

## 2020-10-13 LAB — BASIC METABOLIC PANEL
BUN: 20 mg/dL (ref 6–23)
CO2: 29 mEq/L (ref 19–32)
Calcium: 8.9 mg/dL (ref 8.4–10.5)
Chloride: 106 mEq/L (ref 96–112)
Creatinine, Ser: 1.52 mg/dL — ABNORMAL HIGH (ref 0.40–1.20)
GFR: 32.6 mL/min — ABNORMAL LOW (ref >60.00)
Glucose, Bld: 98 mg/dL (ref 70–99)
Potassium: 4.1 mEq/L (ref 3.5–5.1)
Sodium: 142 mEq/L (ref 135–145)

## 2020-10-17 ENCOUNTER — Telehealth: Payer: Self-pay | Admitting: Internal Medicine

## 2020-10-17 ENCOUNTER — Other Ambulatory Visit: Payer: Self-pay

## 2020-10-17 DIAGNOSIS — K7469 Other cirrhosis of liver: Secondary | ICD-10-CM

## 2020-10-17 NOTE — Telephone Encounter (Signed)
See result note.  

## 2020-10-17 NOTE — Telephone Encounter (Signed)
Patient returned your call about results, please call patient one more time.   

## 2020-10-20 ENCOUNTER — Telehealth: Payer: Self-pay | Admitting: Oncology

## 2020-10-20 ENCOUNTER — Encounter: Payer: Self-pay | Admitting: Nurse Practitioner

## 2020-10-20 ENCOUNTER — Inpatient Hospital Stay: Payer: Medicare Other | Admitting: Nurse Practitioner

## 2020-10-20 ENCOUNTER — Inpatient Hospital Stay: Payer: Medicare Other | Attending: Oncology

## 2020-10-20 ENCOUNTER — Other Ambulatory Visit: Payer: Self-pay

## 2020-10-20 VITALS — BP 178/72 | HR 73 | Temp 98.1°F | Resp 18 | Ht 67.0 in | Wt 214.6 lb

## 2020-10-20 DIAGNOSIS — K7469 Other cirrhosis of liver: Secondary | ICD-10-CM | POA: Diagnosis not present

## 2020-10-20 DIAGNOSIS — E119 Type 2 diabetes mellitus without complications: Secondary | ICD-10-CM | POA: Insufficient documentation

## 2020-10-20 DIAGNOSIS — D649 Anemia, unspecified: Secondary | ICD-10-CM

## 2020-10-20 DIAGNOSIS — I10 Essential (primary) hypertension: Secondary | ICD-10-CM | POA: Insufficient documentation

## 2020-10-20 DIAGNOSIS — Z9049 Acquired absence of other specified parts of digestive tract: Secondary | ICD-10-CM | POA: Insufficient documentation

## 2020-10-20 DIAGNOSIS — I82401 Acute embolism and thrombosis of unspecified deep veins of right lower extremity: Secondary | ICD-10-CM | POA: Diagnosis not present

## 2020-10-20 DIAGNOSIS — Z85038 Personal history of other malignant neoplasm of large intestine: Secondary | ICD-10-CM | POA: Diagnosis present

## 2020-10-20 DIAGNOSIS — C189 Malignant neoplasm of colon, unspecified: Secondary | ICD-10-CM | POA: Diagnosis not present

## 2020-10-20 DIAGNOSIS — D509 Iron deficiency anemia, unspecified: Secondary | ICD-10-CM | POA: Insufficient documentation

## 2020-10-20 DIAGNOSIS — R140 Abdominal distension (gaseous): Secondary | ICD-10-CM | POA: Insufficient documentation

## 2020-10-20 DIAGNOSIS — R609 Edema, unspecified: Secondary | ICD-10-CM | POA: Diagnosis not present

## 2020-10-20 DIAGNOSIS — N2889 Other specified disorders of kidney and ureter: Secondary | ICD-10-CM | POA: Insufficient documentation

## 2020-10-20 DIAGNOSIS — Z86718 Personal history of other venous thrombosis and embolism: Secondary | ICD-10-CM | POA: Insufficient documentation

## 2020-10-20 DIAGNOSIS — Z79899 Other long term (current) drug therapy: Secondary | ICD-10-CM | POA: Diagnosis not present

## 2020-10-20 LAB — CBC WITH DIFFERENTIAL (CANCER CENTER ONLY)
Abs Immature Granulocytes: 0.01 10*3/uL (ref 0.00–0.07)
Basophils Absolute: 0 10*3/uL (ref 0.0–0.1)
Basophils Relative: 0 %
Eosinophils Absolute: 0.1 10*3/uL (ref 0.0–0.5)
Eosinophils Relative: 2 %
HCT: 33 % — ABNORMAL LOW (ref 36.0–46.0)
Hemoglobin: 10.3 g/dL — ABNORMAL LOW (ref 12.0–15.0)
Immature Granulocytes: 0 %
Lymphocytes Relative: 34 %
Lymphs Abs: 1.6 10*3/uL (ref 0.7–4.0)
MCH: 29.4 pg (ref 26.0–34.0)
MCHC: 31.2 g/dL (ref 30.0–36.0)
MCV: 94.3 fL (ref 80.0–100.0)
Monocytes Absolute: 0.3 10*3/uL (ref 0.1–1.0)
Monocytes Relative: 7 %
Neutro Abs: 2.7 10*3/uL (ref 1.7–7.7)
Neutrophils Relative %: 57 %
Platelet Count: 125 10*3/uL — ABNORMAL LOW (ref 150–400)
RBC: 3.5 MIL/uL — ABNORMAL LOW (ref 3.87–5.11)
RDW: 15.5 % (ref 11.5–15.5)
WBC Count: 4.8 10*3/uL (ref 4.0–10.5)
nRBC: 0 % (ref 0.0–0.2)

## 2020-10-20 LAB — SAMPLE TO BLOOD BANK

## 2020-10-20 LAB — FERRITIN: Ferritin: 43 ng/mL (ref 11–307)

## 2020-10-20 NOTE — Progress Notes (Signed)
[place] OFFICE PROGRESS NOTE   Diagnosis: Anemia, history of colon cancer, cirrhosis  INTERVAL HISTORY:   Ms. Rawlinson returns as scheduled.  She feels "fairly well".  She denies bleeding.  Good appetite.  Abdomen feels distended.  She does not feel she needs a paracentesis.  She continues to note leg swelling.  Objective:  Vital signs in last 24 hours:  Blood pressure (!) 178/72, pulse 73, temperature 98.1 F (36.7 C), temperature source Tympanic, resp. rate 18, height _0  (1.702 m), weight 214 lb 9.6 oz (97.3 kg), SpO2 96 %.    Resp: Lungs clear bilaterally. Cardio: Regular rate and rhythm. GI: Abdomen is distended consistent with ascites. Vascular: Pitting edema below the knee bilaterally.   Lab Results:  Lab Results  Component Value Date   WBC 4.8 10/20/2020   HGB 10.3 (L) 10/20/2020   HCT 33.0 (L) 10/20/2020   MCV 94.3 10/20/2020   PLT 125 (L) 10/20/2020   NEUTROABS 2.7 10/20/2020    Imaging:  No results found.  Medications: I have reviewed the patient's current medications.  Assessment/Plan: 1. 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 ? Loss of MLH1 and PMS2 expression ? Microsatellite instability-high ? CT chest 03/03/2015-negative for metastatic disease ? Cycle 1 adjuvant Xeloda 03/18/2015 ? Cycle 2 adjuvant Xeloda 04/09/2015 ? Cycle 3 adjuvant Xeloda 04/30/2015 ? Cycle 4 adjuvant Xeloda 05/21/2015 ? Cycle 5 adjuvant Xeloda 06/11/2015 (dose reduced due to desquamation of feet). ? Cycle 6 adjuvant Xeloda 07/02/2015 ? Cycle 7 adjuvant Xeloda 07/23/2015 ? Cycle 8 adjuvant Xeloda 08/13/2015 ? CTs chest, abdomen, and pelvis 02/06/2016-cirrhosis, heterogenous enhancement of the  liver, small hypoenhancing lesions in the liver-? 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 Likely regenerative nodules in the liver;does not appear as HCCor metastases. Left renal mass very slow growing. CT scan recommended in one year) ? Colonoscopy 08/06/2016-multiple 5-15 mm very subtle sessile serrated adenomasfound in the transverse colon and at the anastomosis. Next colonoscopy at a one-year interval. ? CT 12/23/2017-no evidence of recurrent colon cancer, cirrhotic liver with no dominant mass, left renal mass-cystic component mildly larger compared to 2017 ? Colonoscopy 06/17/2020-hyperplastic polyps and a tubular adenoma removed from the rectum and colon  2.Anemia, microcytic. Likely iron deficiencyand ongoing GI blood loss progressive anemia, status post red cell transfusion 06/15/2020, 06/30/2020, 07/26/2020, 08/05/2020  IV iron 08/05/2020 and 08/12/2020  2. 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 She is followed by Dr. Alyson Ingles, urology.Unchanged on CT 02/06/2016, unchanged on MRI 12/06/2016, cystic component slightly larger on CT 12/23/2017 3. Diabetes 4. Hypertension 5. History of mild hand/foot syndrome secondary to Xeloda 6. Enlarging segment 8 liver lesion noted on MRI 12/06/2016-felt to potentially represent a metastasis or HCC  Ultrasound-guided biopsy of the lesion on 02/06/2017 revealed cirrhosis, no malignancy  CT 12/23/2017-chronic liver nodularity, no dominant mass, changes of cirrhosis  CT 04/29/2020-enlarging right liver lesion concerning for [facility], separate from the lesion biopsied in 2018 8. Cirrhosis 9. History of mild thrombocytopenia-likely secondary to cirrhosis 10. 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 Harrell remains  stable from a hematologic standpoint.  She will return for a follow-up CBC in  1 month.  Signs/symptoms suggestive of progressive anemia reviewed with her.  She understands to contact the office should she develop any of these.  With regard to the liver lesion noted on CT 04/29/2020 the plan is for a follow-up CT scan in approximately 2 months.  She will return for a follow-up visit on 12/13/2020, CT scan the day prior.  Plan reviewed with Dr. Benay Spice.  Ned Card ANP/GNP-BC   10/20/2020  11:35 AM

## 2020-10-20 NOTE — Telephone Encounter (Signed)
Scheduled appt per 4/7 los - gave patient AVS and calender.   

## 2020-10-31 ENCOUNTER — Telehealth: Payer: Self-pay | Admitting: Internal Medicine

## 2020-10-31 ENCOUNTER — Other Ambulatory Visit: Payer: Self-pay

## 2020-10-31 DIAGNOSIS — R188 Other ascites: Secondary | ICD-10-CM

## 2020-10-31 NOTE — Telephone Encounter (Signed)
Pt states her clothes are fitting tighter and she has had over a 5 pound weight gain. Reports she is having discomfort due to the fluid. Requesting a paracentesis, please advise.

## 2020-10-31 NOTE — Telephone Encounter (Signed)
Ultrasound-guided paracentesis up to 4 L Send fluid for cell count with differential Provide IV albumin replacement per usual

## 2020-10-31 NOTE — Telephone Encounter (Signed)
Pt scheduled for IR para at [facility] 11/03/20@2pm , pt to arrive there at 1:45pm. Pt aware of appt.

## 2020-10-31 NOTE — Telephone Encounter (Signed)
Inbound call from patient requesting to schedule paracentesis.  Please advise.

## 2020-11-03 ENCOUNTER — Other Ambulatory Visit: Payer: Self-pay

## 2020-11-03 ENCOUNTER — Ambulatory Visit (HOSPITAL_COMMUNITY)
Admission: RE | Admit: 2020-11-03 | Discharge: 2020-11-03 | Disposition: A | Discharge status: Home / Self Care | Payer: Medicare Other | Source: Ambulatory Visit | Attending: Internal Medicine | Admitting: Internal Medicine

## 2020-11-03 DIAGNOSIS — R188 Other ascites: Secondary | ICD-10-CM | POA: Insufficient documentation

## 2020-11-03 HISTORY — PX: IR PARACENTESIS: IMG2679

## 2020-11-03 LAB — BODY FLUID CELL COUNT WITH DIFFERENTIAL
Eos, Fluid: 0 %
Lymphs, Fluid: 63 %
Monocyte-Macrophage-Serous Fluid: 26 % — ABNORMAL LOW (ref 50–90)
Neutrophil Count, Fluid: 11 % (ref 0–25)
Total Nucleated Cell Count, Fluid: 325 cu mm (ref 0–1000)

## 2020-11-03 MED ORDER — LIDOCAINE HCL 1 % IJ SOLN
INTRAMUSCULAR | Status: AC
  Filled 2020-11-03: qty 20

## 2020-11-03 MED ORDER — LIDOCAINE HCL 1 % IJ SOLN
INTRAMUSCULAR | Status: AC | PRN
Start: 2020-11-03 — End: 2020-11-03
  Administered 2020-11-03: 10 mL

## 2020-11-03 NOTE — Procedures (Signed)
Ultrasound-guided diagnostic and therapeutic paracentesis performed yielding 4 liters (maximum ordered) of yellow fluid. No immediate complications. A portion of the fluid was sent to the lab for preordered studies. EBL none.

## 2020-11-07 LAB — PATHOLOGIST SMEAR REVIEW

## 2020-11-17 ENCOUNTER — Inpatient Hospital Stay: Payer: Medicare Other | Attending: Nurse Practitioner

## 2020-11-17 ENCOUNTER — Other Ambulatory Visit: Payer: Self-pay

## 2020-11-17 DIAGNOSIS — D509 Iron deficiency anemia, unspecified: Secondary | ICD-10-CM | POA: Diagnosis not present

## 2020-11-17 DIAGNOSIS — K7469 Other cirrhosis of liver: Secondary | ICD-10-CM | POA: Diagnosis not present

## 2020-11-17 DIAGNOSIS — C189 Malignant neoplasm of colon, unspecified: Secondary | ICD-10-CM

## 2020-11-17 DIAGNOSIS — Z79899 Other long term (current) drug therapy: Secondary | ICD-10-CM | POA: Diagnosis not present

## 2020-11-17 DIAGNOSIS — Z85038 Personal history of other malignant neoplasm of large intestine: Secondary | ICD-10-CM | POA: Diagnosis present

## 2020-11-17 DIAGNOSIS — D649 Anemia, unspecified: Secondary | ICD-10-CM

## 2020-11-17 DIAGNOSIS — I10 Essential (primary) hypertension: Secondary | ICD-10-CM | POA: Diagnosis not present

## 2020-11-17 DIAGNOSIS — E119 Type 2 diabetes mellitus without complications: Secondary | ICD-10-CM | POA: Insufficient documentation

## 2020-11-17 LAB — CBC WITH DIFFERENTIAL (CANCER CENTER ONLY)
Abs Immature Granulocytes: 0.01 10*3/uL (ref 0.00–0.07)
Basophils Absolute: 0 10*3/uL (ref 0.0–0.1)
Basophils Relative: 1 %
Eosinophils Absolute: 0.1 10*3/uL (ref 0.0–0.5)
Eosinophils Relative: 2 %
HCT: 32.5 % — ABNORMAL LOW (ref 36.0–46.0)
Hemoglobin: 9.9 g/dL — ABNORMAL LOW (ref 12.0–15.0)
Immature Granulocytes: 0 %
Lymphocytes Relative: 35 %
Lymphs Abs: 1.6 10*3/uL (ref 0.7–4.0)
MCH: 28.9 pg (ref 26.0–34.0)
MCHC: 30.5 g/dL (ref 30.0–36.0)
MCV: 94.8 fL (ref 80.0–100.0)
Monocytes Absolute: 0.3 10*3/uL (ref 0.1–1.0)
Monocytes Relative: 7 %
Neutro Abs: 2.6 10*3/uL (ref 1.7–7.7)
Neutrophils Relative %: 55 %
Platelet Count: 119 10*3/uL — ABNORMAL LOW (ref 150–400)
RBC: 3.43 MIL/uL — ABNORMAL LOW (ref 3.87–5.11)
RDW: 14.9 % (ref 11.5–15.5)
WBC Count: 4.6 10*3/uL (ref 4.0–10.5)
nRBC: 0 % (ref 0.0–0.2)

## 2020-11-17 LAB — SAMPLE TO BLOOD BANK

## 2020-11-22 ENCOUNTER — Other Ambulatory Visit: Payer: Self-pay | Admitting: *Deleted

## 2020-11-24 ENCOUNTER — Other Ambulatory Visit: Payer: Self-pay | Admitting: *Deleted

## 2020-11-24 ENCOUNTER — Other Ambulatory Visit (INDEPENDENT_AMBULATORY_CARE_PROVIDER_SITE_OTHER): Payer: Medicare Other

## 2020-11-24 DIAGNOSIS — D649 Anemia, unspecified: Secondary | ICD-10-CM

## 2020-11-24 DIAGNOSIS — K7469 Other cirrhosis of liver: Secondary | ICD-10-CM | POA: Diagnosis not present

## 2020-11-24 DIAGNOSIS — C189 Malignant neoplasm of colon, unspecified: Secondary | ICD-10-CM

## 2020-11-24 LAB — BASIC METABOLIC PANEL
BUN: 25 mg/dL — ABNORMAL HIGH (ref 6–23)
CO2: 29 mEq/L (ref 19–32)
Calcium: 8.9 mg/dL (ref 8.4–10.5)
Chloride: 106 mEq/L (ref 96–112)
Creatinine, Ser: 1.64 mg/dL — ABNORMAL HIGH (ref 0.40–1.20)
GFR: 29.73 mL/min — ABNORMAL LOW (ref >60.00)
Glucose, Bld: 77 mg/dL (ref 70–99)
Potassium: 4.5 mEq/L (ref 3.5–5.1)
Sodium: 141 mEq/L (ref 135–145)

## 2020-11-24 NOTE — Progress Notes (Signed)
Call from [place] on [place]. Our orders were accidentally released. Re-entered orders.

## 2020-11-25 ENCOUNTER — Other Ambulatory Visit: Payer: Self-pay

## 2020-11-25 DIAGNOSIS — R188 Other ascites: Secondary | ICD-10-CM

## 2020-11-28 ENCOUNTER — Ambulatory Visit (HOSPITAL_COMMUNITY)
Admission: RE | Admit: 2020-11-28 | Discharge: 2020-11-28 | Disposition: A | Discharge status: Home / Self Care | Payer: Medicare Other | Source: Ambulatory Visit | Attending: Internal Medicine | Admitting: Internal Medicine

## 2020-11-28 ENCOUNTER — Other Ambulatory Visit: Payer: Self-pay

## 2020-11-28 DIAGNOSIS — R188 Other ascites: Secondary | ICD-10-CM | POA: Insufficient documentation

## 2020-11-28 HISTORY — PX: IR PARACENTESIS: IMG2679

## 2020-11-28 LAB — BODY FLUID CELL COUNT WITH DIFFERENTIAL
Lymphs, Fluid: 78 %
Monocyte-Macrophage-Serous Fluid: 17 % — ABNORMAL LOW (ref 50–90)
Neutrophil Count, Fluid: 5 % (ref 0–25)
Total Nucleated Cell Count, Fluid: 313 cu mm (ref 0–1000)

## 2020-11-28 MED ORDER — ALBUMIN HUMAN 25 % IV SOLN
12.5000 g | Freq: Once | INTRAVENOUS | Status: AC
  Administered 2020-11-28: 12.5 g via INTRAVENOUS

## 2020-11-28 MED ORDER — LIDOCAINE HCL 1 % IJ SOLN
INTRAMUSCULAR | Status: AC
  Filled 2020-11-28: qty 20

## 2020-11-28 MED ORDER — LIDOCAINE HCL (PF) 1 % IJ SOLN
INTRAMUSCULAR | Status: DC | PRN
  Administered 2020-11-28: 30 mL

## 2020-11-28 MED ORDER — ALBUMIN HUMAN 25 % IV SOLN
INTRAVENOUS | Status: AC
  Filled 2020-11-28: qty 50

## 2020-11-28 NOTE — Procedures (Signed)
PROCEDURE SUMMARY:  Successful US guided paracentesis from RLQ.  Yielded 5 L of clear yellow fluid.  No immediate complications.  Pt tolerated well.   Specimen was not sent for labs.  EBL < 40mL  Ascencion Dike PA-C 11/28/2020 11:44 AM

## 2020-11-30 ENCOUNTER — Other Ambulatory Visit (HOSPITAL_COMMUNITY)
Admission: RE | Admit: 2020-11-30 | Discharge: 2020-11-30 | Disposition: A | Discharge status: Home / Self Care | Payer: Medicare Other | Source: Ambulatory Visit | Attending: Internal Medicine | Admitting: Internal Medicine

## 2020-11-30 ENCOUNTER — Other Ambulatory Visit (HOSPITAL_COMMUNITY): Payer: Self-pay

## 2020-11-30 ENCOUNTER — Other Ambulatory Visit: Payer: Self-pay

## 2020-11-30 DIAGNOSIS — K7469 Other cirrhosis of liver: Secondary | ICD-10-CM | POA: Diagnosis present

## 2020-11-30 DIAGNOSIS — R188 Other ascites: Secondary | ICD-10-CM

## 2020-11-30 LAB — PATHOLOGIST SMEAR REVIEW

## 2020-12-01 LAB — CYTOLOGY - NON PAP

## 2020-12-13 ENCOUNTER — Ambulatory Visit (HOSPITAL_BASED_OUTPATIENT_CLINIC_OR_DEPARTMENT_OTHER)
Admission: RE | Admit: 2020-12-13 | Discharge: 2020-12-13 | Disposition: A | Discharge status: Home / Self Care | Payer: Medicare Other | Source: Ambulatory Visit | Attending: Nurse Practitioner | Admitting: Nurse Practitioner

## 2020-12-13 ENCOUNTER — Inpatient Hospital Stay: Payer: Medicare Other

## 2020-12-13 ENCOUNTER — Other Ambulatory Visit: Payer: Self-pay

## 2020-12-13 DIAGNOSIS — D509 Iron deficiency anemia, unspecified: Secondary | ICD-10-CM | POA: Diagnosis not present

## 2020-12-13 DIAGNOSIS — D649 Anemia, unspecified: Secondary | ICD-10-CM

## 2020-12-13 DIAGNOSIS — C189 Malignant neoplasm of colon, unspecified: Secondary | ICD-10-CM

## 2020-12-13 LAB — CBC WITH DIFFERENTIAL (CANCER CENTER ONLY)
Abs Immature Granulocytes: 0.02 10*3/uL (ref 0.00–0.07)
Basophils Absolute: 0 10*3/uL (ref 0.0–0.1)
Basophils Relative: 1 %
Eosinophils Absolute: 0.1 10*3/uL (ref 0.0–0.5)
Eosinophils Relative: 2 %
HCT: 30.1 % — ABNORMAL LOW (ref 36.0–46.0)
Hemoglobin: 9.2 g/dL — ABNORMAL LOW (ref 12.0–15.0)
Immature Granulocytes: 1 %
Lymphocytes Relative: 33 %
Lymphs Abs: 1.4 10*3/uL (ref 0.7–4.0)
MCH: 28.5 pg (ref 26.0–34.0)
MCHC: 30.6 g/dL (ref 30.0–36.0)
MCV: 93.2 fL (ref 80.0–100.0)
Monocytes Absolute: 0.3 10*3/uL (ref 0.1–1.0)
Monocytes Relative: 7 %
Neutro Abs: 2.4 10*3/uL (ref 1.7–7.7)
Neutrophils Relative %: 56 %
Platelet Count: 114 10*3/uL — ABNORMAL LOW (ref 150–400)
RBC: 3.23 MIL/uL — ABNORMAL LOW (ref 3.87–5.11)
RDW: 15.4 % (ref 11.5–15.5)
WBC Count: 4.2 10*3/uL (ref 4.0–10.5)
nRBC: 0 % (ref 0.0–0.2)

## 2020-12-13 LAB — BASIC METABOLIC PANEL - CANCER CENTER ONLY
Anion gap: 7 (ref 5–15)
BUN: 25 mg/dL — ABNORMAL HIGH (ref 8–23)
CO2: 26 mmol/L (ref 22–32)
Calcium: 8.5 mg/dL — ABNORMAL LOW (ref 8.9–10.3)
Chloride: 109 mmol/L (ref 98–111)
Creatinine: 1.54 mg/dL — ABNORMAL HIGH (ref 0.44–1.00)
GFR, Estimated: 34 mL/min — ABNORMAL LOW (ref >60)
Glucose, Bld: 115 mg/dL — ABNORMAL HIGH (ref 70–99)
Potassium: 4.8 mmol/L (ref 3.5–5.1)
Sodium: 142 mmol/L (ref 135–145)

## 2020-12-13 LAB — SAMPLE TO BLOOD BANK

## 2020-12-14 ENCOUNTER — Inpatient Hospital Stay: Payer: Medicare Other | Attending: Oncology | Admitting: Oncology

## 2020-12-14 VITALS — BP 166/65 | HR 64 | Temp 97.3°F | Resp 20 | Wt 211.6 lb

## 2020-12-14 DIAGNOSIS — K7469 Other cirrhosis of liver: Secondary | ICD-10-CM | POA: Diagnosis not present

## 2020-12-14 DIAGNOSIS — K5730 Diverticulosis of large intestine without perforation or abscess without bleeding: Secondary | ICD-10-CM | POA: Insufficient documentation

## 2020-12-14 DIAGNOSIS — C189 Malignant neoplasm of colon, unspecified: Secondary | ICD-10-CM

## 2020-12-14 DIAGNOSIS — R161 Splenomegaly, not elsewhere classified: Secondary | ICD-10-CM | POA: Insufficient documentation

## 2020-12-14 DIAGNOSIS — D1803 Hemangioma of intra-abdominal structures: Secondary | ICD-10-CM | POA: Insufficient documentation

## 2020-12-14 DIAGNOSIS — N2889 Other specified disorders of kidney and ureter: Secondary | ICD-10-CM | POA: Insufficient documentation

## 2020-12-14 DIAGNOSIS — D509 Iron deficiency anemia, unspecified: Secondary | ICD-10-CM | POA: Insufficient documentation

## 2020-12-14 DIAGNOSIS — R12 Heartburn: Secondary | ICD-10-CM | POA: Insufficient documentation

## 2020-12-14 DIAGNOSIS — E119 Type 2 diabetes mellitus without complications: Secondary | ICD-10-CM | POA: Diagnosis not present

## 2020-12-14 DIAGNOSIS — I700 Atherosclerosis of aorta: Secondary | ICD-10-CM | POA: Diagnosis not present

## 2020-12-14 DIAGNOSIS — Z9049 Acquired absence of other specified parts of digestive tract: Secondary | ICD-10-CM | POA: Insufficient documentation

## 2020-12-14 DIAGNOSIS — Z79899 Other long term (current) drug therapy: Secondary | ICD-10-CM | POA: Insufficient documentation

## 2020-12-14 DIAGNOSIS — R188 Other ascites: Secondary | ICD-10-CM | POA: Insufficient documentation

## 2020-12-14 DIAGNOSIS — C180 Malignant neoplasm of cecum: Secondary | ICD-10-CM | POA: Diagnosis present

## 2020-12-14 DIAGNOSIS — Z86718 Personal history of other venous thrombosis and embolism: Secondary | ICD-10-CM | POA: Insufficient documentation

## 2020-12-14 DIAGNOSIS — I10 Essential (primary) hypertension: Secondary | ICD-10-CM | POA: Diagnosis not present

## 2020-12-14 NOTE — Progress Notes (Signed)
[place] OFFICE PROGRESS NOTE   Diagnosis: Colon cancer, cirrhosis, liver lesion  INTERVAL HISTORY:   [name] returns as scheduled.  She undergoes repeat paracentesis procedures via Dr. Henrene Pastor as needed.  No bleeding.  She takes iron intermittently.  She complains of heartburn, especially in the mornings.  No dyspnea.  Objective:  Vital signs in last 24 hours:  Blood pressure (!) 166/65, pulse 64, temperature (!) 97.3 F (36.3 C), temperature source Temporal, resp. rate 20, weight 211 lb 9.6 oz (96 kg), SpO2 100 %.    Resp: Inspiratory rhonchi at the lower posterior chest bilaterally, no respiratory distress Cardio: Regular rate and rhythm GI: Distended with ascites, no hepatosplenomegaly, nontender Vascular: Trace pitting edema at the lower leg bilaterally    Lab Results:  Lab Results  Component Value Date   WBC 4.2 12/13/2020   HGB 9.2 (L) 12/13/2020   HCT 30.1 (L) 12/13/2020   MCV 93.2 12/13/2020   PLT 114 (L) 12/13/2020   NEUTROABS 2.4 12/13/2020    CMP  Lab Results  Component Value Date   NA 142 12/13/2020   K 4.8 12/13/2020   CL 109 12/13/2020   CO2 26 12/13/2020   GLUCOSE 115 (H) 12/13/2020   BUN 25 (H) 12/13/2020   CREATININE 1.54 (H) 12/13/2020   CALCIUM 8.5 (L) 12/13/2020   PROT 6.5 09/21/2020   ALBUMIN 3.2 (L) 09/21/2020   AST 25 09/21/2020   ALT 11 09/21/2020   ALKPHOS 113 09/21/2020   BILITOT 0.7 09/21/2020   GFRNONAA 34 (L) 12/13/2020   GFRAA 59 (L) 11/17/2017    Lab Results  Component Value Date   CEA1 3.24 06/30/2020    Imaging:  CT Abdomen Pelvis Wo Contrast  Result Date: 12/13/2020 CLINICAL DATA:  Colon cancer.  History of liver lesion. EXAM: CT ABDOMEN AND PELVIS WITHOUT CONTRAST TECHNIQUE: Multidetector CT imaging of the abdomen and pelvis was performed following the standard protocol without IV contrast. COMPARISON:  04/29/2020 FINDINGS: Lower chest:   Basilar atelectasis with small left effusion Hepatobiliary:  Nodular liver contour compatible with cirrhosis. Right hepatic lesion measured previously at 2.8 x 2.5 cm is now 2.9 x 2.7 cm. There is probably a second but more subtle 12 mm lesion in the posterior right liver on 19/2. Gallbladder is surgically absent. No intrahepatic or extrahepatic biliary dilation. Pancreas: No focal mass lesion. No dilatation of the main duct. No intraparenchymal cyst. No peripancreatic edema. Spleen: Spleen measures 15 cm craniocaudal length, upper normal to borderline enlarged. Adrenals/Urinary Tract: No adrenal nodule or mass. Small cyst noted in both kidneys, left greater than right. No hydroureteronephrosis. The urinary bladder appears normal for the degree of distention. Stomach/Bowel: Stomach is decompressed. Duodenum is normally positioned as is the ligament of Treitz. Duodenal diverticulum noted. No small bowel Cheryl Jordan thickening. No small bowel dilatation. Status post right hemicolectomy. Diverticular changes are noted in the left colon without evidence of diverticulitis. Vascular/Lymphatic: There is abdominal aortic atherosclerosis without aneurysm. There is no gastrohepatic or hepatoduodenal ligament lymphadenopathy. No retroperitoneal or mesenteric lymphadenopathy. No pelvic sidewall lymphadenopathy. Reproductive: The uterus is unremarkable.  There is no adnexal mass. Other: Interval progression of ascites now moderate volume. Nodular soft tissue attenuation in the omentum may be related to edema although 1 region in the left abdomen measures 3.1 x 1.0 cm in appears more like soft tissue than omental fluid. Musculoskeletal: No worrisome lytic or sclerotic osseous abnormality. IMPRESSION: 1. Interval progression of the right hepatic lesion with probably a second  but more subtle 12 mm lesion in the posterior right liver. Neither lesion can not be definitively characterized. Hepatocellular carcinoma would be a consideration. Given the history of colon cancer, metastatic disease not  excluded. Multiphase CT of the abdomen or abdominal MRI with/without contrast could be used to further evaluate as clinically warranted. 2. Interval progression of ascites, now moderate volume. 3. Nodular soft tissue attenuation in the omentum may be related to edema although there is a region in the left abdomen measures 3.1 x 1.0 cm that appears more like soft tissue than omental fluid. Close attention on follow-up recommended as metastatic disease not excluded. 4. Cirrhosis. 5. Borderline splenomegaly. 6. 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 7. Aortic Atherosclerosis (ICD10-I70.0). Electronically Signed   By: Misty Stanley M.D.   On: 12/13/2020 10:55    Medications: I have reviewed the patient's current medications.   Assessment/Plan: 1. 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 ? Loss of MLH1 and PMS2 expression ? Microsatellite instability-high ? CT chest 03/03/2015-negative for metastatic disease ? Cycle 1 adjuvant Xeloda 03/18/2015 ? Cycle 2 adjuvant Xeloda 04/09/2015 ? Cycle 3 adjuvant Xeloda 04/30/2015 ? Cycle 4 adjuvant Xeloda 05/21/2015 ? Cycle 5 adjuvant Xeloda 06/11/2015 (dose reduced due to desquamation of feet). ? Cycle 6 adjuvant Xeloda 07/02/2015 ? Cycle 7 adjuvant Xeloda 07/23/2015 ? Cycle 8 adjuvant Xeloda 08/13/2015 ? CTs chest, abdomen, and pelvis 02/06/2016-cirrhosis, heterogenous enhancement of the liver, small hypoenhancing lesions in the liver-? 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 Likely regenerative nodules in the liver;does not appear as HCCor metastases. Left renal mass very slow growing. CT scan recommended in one  year) ? Colonoscopy 08/06/2016-multiple 5-15 mm very subtle sessile serrated adenomasfound in the transverse colon and at the anastomosis. Next colonoscopy at a one-year interval. ? CT 12/23/2017-no evidence of recurrent colon cancer, cirrhotic liver with no dominant mass, left renal mass-cystic component mildly larger compared to 2017 ? Colonoscopy 06/17/2020-hyperplastic polyps and a tubular adenoma removed from the rectum and colon  2.Anemia, microcytic. Likely iron deficiencyand ongoing GI blood loss progressive anemia, status post red cell transfusion 06/15/2020, 06/30/2020, 07/26/2020, 08/05/2020  IV iron 08/05/2020 and 08/12/2020  2. 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 She is followed by Dr. Alyson Ingles, urology.Unchanged on CT 02/06/2016, unchanged on MRI 12/06/2016, cystic component slightly larger on CT 12/23/2017 3. Diabetes 4. Hypertension 5. History of mild hand/foot syndrome secondary to Xeloda 6. Enlarging segment 8 liver lesion noted on MRI 12/06/2016-felt to potentially represent a metastasis or HCC  Ultrasound-guided biopsy of the lesion on 02/06/2017 revealed cirrhosis, no malignancy  CT 12/23/2017-chronic liver nodularity, no dominant mass, changes of cirrhosis  CT 04/29/2020-enlarging right liver lesion concerning for [facility], separate from the lesion biopsied in 2018  CT 12/13/2020- enlargement of right hepatic lesion, probable second more subtle lesion in the posterior right liver, progression of ascites with nodularity of the omentum-edema related? 8. Cirrhosis 9. History of mild thrombocytopenia-likely secondary to cirrhosis 10. 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 appears unchanged.  The hemoglobin is slightly lower, but she has been off of transfusion support since January.  She  remains off of anticoagulation  therapy.  I encouraged her to increase the use of iron.  I reviewed the CT images with her.  The right liver lesion appears slightly larger and there may be a second lesion.  She agrees to an MRI to further evaluate the liver lesions and omental nodularity.  I have a low clinical suspicion for recurrent colon cancer.  She will return for an office visit after the liver MRI.  I will present her case at the GI tumor conference following the MRI.  She will follow-up with Dr. Henrene Pastor for management of "heartburn", and paracentesis procedures as needed.  [name], MD  12/14/2020  11:42 AM

## 2020-12-19 ENCOUNTER — Telehealth: Payer: Self-pay | Admitting: Internal Medicine

## 2020-12-19 NOTE — Telephone Encounter (Signed)
Patient called requesting appointment for paracenthesis

## 2020-12-20 ENCOUNTER — Other Ambulatory Visit: Payer: Self-pay

## 2020-12-20 DIAGNOSIS — R188 Other ascites: Secondary | ICD-10-CM

## 2020-12-20 NOTE — Telephone Encounter (Signed)
Ultrasound-guided paracentesis.  Up to 5 L.  Provide IV albumin replacement.  Send fluid for cell count with differential. Thanks

## 2020-12-20 NOTE — Telephone Encounter (Signed)
Pt scheduled for IR para at [facility] per Dr. Blanch Media orders stated below. Pt scheduled for 12/21/20@2pm , pt to arrive there at 1:45pm. Pt aware of appt.

## 2020-12-20 NOTE — Telephone Encounter (Signed)
Pt calling for paracentesis. Please advise.

## 2020-12-21 ENCOUNTER — Ambulatory Visit (HOSPITAL_COMMUNITY)
Admission: RE | Admit: 2020-12-21 | Discharge: 2020-12-21 | Disposition: A | Discharge status: Home / Self Care | Payer: Medicare Other | Source: Ambulatory Visit | Attending: Internal Medicine | Admitting: Internal Medicine

## 2020-12-21 ENCOUNTER — Other Ambulatory Visit: Payer: Self-pay

## 2020-12-21 DIAGNOSIS — R188 Other ascites: Secondary | ICD-10-CM | POA: Diagnosis not present

## 2020-12-21 HISTORY — PX: IR PARACENTESIS: IMG2679

## 2020-12-21 LAB — BODY FLUID CELL COUNT WITH DIFFERENTIAL
Eos, Fluid: 0 %
Lymphs, Fluid: 72 %
Monocyte-Macrophage-Serous Fluid: 12 % — ABNORMAL LOW (ref 50–90)
Neutrophil Count, Fluid: 12 % (ref 0–25)
Total Nucleated Cell Count, Fluid: 349 cu mm (ref 0–1000)

## 2020-12-21 MED ORDER — LIDOCAINE HCL (PF) 1 % IJ SOLN
INTRAMUSCULAR | Status: AC
  Filled 2020-12-21: qty 30

## 2020-12-21 NOTE — Procedures (Signed)
PROCEDURE SUMMARY:  Successful image-guided paracentesis from the the left lower abdomen.  Yielded 5.0 liters of clear yellow fluid.  No immediate complications.  EBL < 1 mL Patient tolerated well.   Specimen was sent for labs.  Please see imaging section of Epic for full dictation.  Joaquim Nam PA-C 12/21/2020 3:29 PM

## 2020-12-23 LAB — PATHOLOGIST SMEAR REVIEW

## 2021-01-03 ENCOUNTER — Ambulatory Visit
Admission: RE | Admit: 2021-01-03 | Discharge: 2021-01-03 | Disposition: A | Discharge status: Home / Self Care | Payer: Medicare Other | Source: Ambulatory Visit | Attending: Oncology | Admitting: Oncology

## 2021-01-03 ENCOUNTER — Other Ambulatory Visit: Payer: Self-pay

## 2021-01-03 DIAGNOSIS — C189 Malignant neoplasm of colon, unspecified: Secondary | ICD-10-CM

## 2021-01-03 MED ORDER — GADOBENATE DIMEGLUMINE 529 MG/ML IV SOLN
19.0000 mL | Freq: Once | INTRAVENOUS | Status: AC | PRN
  Administered 2021-01-03: 19 mL via INTRAVENOUS

## 2021-01-11 ENCOUNTER — Other Ambulatory Visit: Payer: Self-pay

## 2021-01-11 ENCOUNTER — Telehealth: Payer: Self-pay | Admitting: Internal Medicine

## 2021-01-11 DIAGNOSIS — K7469 Other cirrhosis of liver: Secondary | ICD-10-CM

## 2021-01-11 DIAGNOSIS — R188 Other ascites: Secondary | ICD-10-CM

## 2021-01-11 NOTE — Telephone Encounter (Signed)
Pt needs to schedule paracentesis asap. She stated that she is very uncomfortable. She said that she is not available tomorrow morning in case procedure is scheduled for tomorrow.

## 2021-01-11 NOTE — Telephone Encounter (Signed)
Scheduled IR para @ [place] on 01/17/21 at 10:00am. Called patient and let her know

## 2021-01-12 ENCOUNTER — Other Ambulatory Visit: Payer: Self-pay

## 2021-01-12 ENCOUNTER — Inpatient Hospital Stay (HOSPITAL_BASED_OUTPATIENT_CLINIC_OR_DEPARTMENT_OTHER): Payer: Medicare Other | Admitting: Oncology

## 2021-01-12 ENCOUNTER — Inpatient Hospital Stay: Payer: Medicare Other

## 2021-01-12 VITALS — BP 137/61 | HR 76 | Temp 97.8°F | Resp 18 | Ht 67.0 in | Wt 210.8 lb

## 2021-01-12 DIAGNOSIS — C180 Malignant neoplasm of cecum: Secondary | ICD-10-CM | POA: Diagnosis not present

## 2021-01-12 DIAGNOSIS — C189 Malignant neoplasm of colon, unspecified: Secondary | ICD-10-CM

## 2021-01-12 DIAGNOSIS — D649 Anemia, unspecified: Secondary | ICD-10-CM

## 2021-01-12 LAB — CMP (CANCER CENTER ONLY)
ALT: 10 U/L (ref 0–44)
AST: 19 U/L (ref 15–41)
Albumin: 3.1 g/dL — ABNORMAL LOW (ref 3.5–5.0)
Alkaline Phosphatase: 116 U/L (ref 38–126)
Anion gap: 8 (ref 5–15)
BUN: 24 mg/dL — ABNORMAL HIGH (ref 8–23)
CO2: 25 mmol/L (ref 22–32)
Calcium: 8.6 mg/dL — ABNORMAL LOW (ref 8.9–10.3)
Chloride: 109 mmol/L (ref 98–111)
Creatinine: 1.91 mg/dL — ABNORMAL HIGH (ref 0.44–1.00)
GFR, Estimated: 27 mL/min — ABNORMAL LOW (ref >60)
Glucose, Bld: 97 mg/dL (ref 70–99)
Potassium: 4.7 mmol/L (ref 3.5–5.1)
Sodium: 142 mmol/L (ref 135–145)
Total Bilirubin: 0.7 mg/dL (ref 0.3–1.2)
Total Protein: 5.9 g/dL — ABNORMAL LOW (ref 6.5–8.1)

## 2021-01-12 LAB — CBC WITH DIFFERENTIAL (CANCER CENTER ONLY)
Abs Immature Granulocytes: 0 10*3/uL (ref 0.00–0.07)
Basophils Absolute: 0 10*3/uL (ref 0.0–0.1)
Basophils Relative: 1 %
Eosinophils Absolute: 0.1 10*3/uL (ref 0.0–0.5)
Eosinophils Relative: 1 %
HCT: 28.1 % — ABNORMAL LOW (ref 36.0–46.0)
Hemoglobin: 8.4 g/dL — ABNORMAL LOW (ref 12.0–15.0)
Immature Granulocytes: 0 %
Lymphocytes Relative: 32 %
Lymphs Abs: 1.4 10*3/uL (ref 0.7–4.0)
MCH: 27.5 pg (ref 26.0–34.0)
MCHC: 29.9 g/dL — ABNORMAL LOW (ref 30.0–36.0)
MCV: 91.8 fL (ref 80.0–100.0)
Monocytes Absolute: 0.3 10*3/uL (ref 0.1–1.0)
Monocytes Relative: 7 %
Neutro Abs: 2.7 10*3/uL (ref 1.7–7.7)
Neutrophils Relative %: 59 %
Platelet Count: 137 10*3/uL — ABNORMAL LOW (ref 150–400)
RBC: 3.06 MIL/uL — ABNORMAL LOW (ref 3.87–5.11)
RDW: 15.2 % (ref 11.5–15.5)
WBC Count: 4.4 10*3/uL (ref 4.0–10.5)
nRBC: 0 % (ref 0.0–0.2)

## 2021-01-12 LAB — CEA (ACCESS): CEA (CHCC): 3.77 ng/mL (ref 0.00–5.00)

## 2021-01-12 NOTE — Progress Notes (Signed)
[place] OFFICE PROGRESS NOTE   Diagnosis: Colon cancer, cirrhosis, anemia  INTERVAL HISTORY:   Ms. Tinner returns as scheduled.  She continues to have intermittent paracentesis procedures for relief of ascites.  No bleeding.   Objective:  Vital signs in last 24 hours:  Blood pressure 137/61, pulse 76, temperature 97.8 F (36.6 C), temperature source Oral, resp. rate 18, height 5' 7" (1.702 m), weight 210 lb 12.8 oz (95.6 kg), SpO2 97 %.     Resp: Lungs clear bilaterally Cardio: Regular rate and rhythm GI: Distended with ascites, no hepatomegaly Vascular: Trace lower leg edema bilaterally   Lab Results:  Lab Results  Component Value Date   WBC 4.4 01/12/2021   HGB 8.4 (L) 01/12/2021   HCT 28.1 (L) 01/12/2021   MCV 91.8 01/12/2021   PLT 137 (L) 01/12/2021   NEUTROABS 2.7 01/12/2021    CMP  Lab Results  Component Value Date   NA 142 01/12/2021   K 4.7 01/12/2021   CL 109 01/12/2021   CO2 25 01/12/2021   GLUCOSE 97 01/12/2021   BUN 24 (H) 01/12/2021   CREATININE 1.91 (H) 01/12/2021   CALCIUM 8.6 (L) 01/12/2021   PROT 5.9 (L) 01/12/2021   ALBUMIN 3.1 (L) 01/12/2021   AST 19 01/12/2021   ALT 10 01/12/2021   ALKPHOS 116 01/12/2021   BILITOT 0.7 01/12/2021   GFRNONAA 27 (L) 01/12/2021   GFRAA 59 (L) 11/17/2017    Lab Results  Component Value Date   CEA1 3.24 06/30/2020    Medications: I have reviewed the patient's current medications.   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 Loss of MLH1 and PMS2 expression Microsatellite instability-high CT chest 03/03/2015-negative for metastatic disease Cycle 1 adjuvant Xeloda 03/18/2015 Cycle 2 adjuvant Xeloda  04/09/2015 Cycle 3 adjuvant Xeloda 04/30/2015 Cycle 4 adjuvant Xeloda 05/21/2015 Cycle 5 adjuvant Xeloda 06/11/2015 (dose reduced due to desquamation of feet). Cycle 6 adjuvant Xeloda 07/02/2015 Cycle 7 adjuvant Xeloda 07/23/2015 Cycle 8 adjuvant Xeloda 08/13/2015 CTs chest, abdomen, and pelvis 02/06/2016-cirrhosis, heterogenous enhancement of the liver, small hypoenhancing lesions in the liver-? 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 Likely regenerative nodules in the liver; does not appear as [place] or metastases. Left renal mass very slow growing. CT scan recommended in one year) Colonoscopy 08/06/2016-multiple 5-15 mm very subtle sessile serrated adenomas found in the transverse colon and at the anastomosis. Next colonoscopy at a one-year interval. CT 12/23/2017- no evidence of recurrent colon cancer, cirrhotic liver with no dominant mass, left renal mass-cystic component mildly larger compared to 2017 Colonoscopy 06/17/2020-hyperplastic polyps and a tubular adenoma removed from the rectum and colon     2. Anemia, microcytic. 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 She is followed by Dr. Alyson Ingles, urology. Unchanged on CT 02/06/2016, unchanged on MRI 12/06/2016, cystic component slightly larger on CT 12/23/2017 Diabetes Hypertension History of mild hand/foot syndrome secondary to Xeloda Enlarging segment 8 liver lesion noted on MRI 12/06/2016-felt to potentially represent a metastasis or HCC Ultrasound-guided biopsy of the lesion on 02/06/2017 revealed cirrhosis, no malignancy CT 12/23/2017- chronic liver nodularity,  no dominant mass, changes of cirrhosis CT 04/29/2020- enlarging right liver lesion concerning for [facility], separate from  the lesion biopsied in 2018 CT 12/13/2020- enlargement of right hepatic lesion, probable second more subtle lesion in the posterior right liver, progression of ascites with nodularity of the omentum-edema related? MRI liver 01/03/2021-2.9 x 2.6 cm segment 7 lesion, Li-RADS 5, second segment 6 lesion, 1.1 cm, hemangioma or septated cyst, cirrhosis, splenomegaly, small left pleural effusion-unchanged Cirrhosis History of mild thrombocytopenia-likely secondary to cirrhosis 10. 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  She is followed by Dr. Henrene Pastor.  The hemoglobin is lower today.  We will check a ferritin level.  If the ferritin is low she will receive IV iron over the next few weeks.  She will return for an office visit and CBC in 3 weeks.  She will call for symptoms of anemia.  We will arrange for Red cell transfusion as needed.  She continues intermittent paracentesis procedures with Dr. Henrene Pastor.  The liver MRI is consistent with an isolated [place] in segment 7.  I will refer her to interventional radiology to consider ablation therapy.  [name], MD  01/12/2021  12:08 PM

## 2021-01-17 ENCOUNTER — Ambulatory Visit (HOSPITAL_COMMUNITY)
Admission: RE | Admit: 2021-01-17 | Discharge: 2021-01-17 | Disposition: A | Discharge status: Home / Self Care | Payer: Medicare Other | Source: Ambulatory Visit | Attending: Internal Medicine | Admitting: Internal Medicine

## 2021-01-17 ENCOUNTER — Encounter: Payer: Self-pay | Admitting: Student

## 2021-01-17 ENCOUNTER — Other Ambulatory Visit: Payer: Self-pay

## 2021-01-17 DIAGNOSIS — K7469 Other cirrhosis of liver: Secondary | ICD-10-CM | POA: Diagnosis present

## 2021-01-17 DIAGNOSIS — R188 Other ascites: Secondary | ICD-10-CM | POA: Diagnosis not present

## 2021-01-17 HISTORY — PX: IR PARACENTESIS: IMG2679

## 2021-01-17 MED ORDER — LIDOCAINE HCL (PF) 1 % IJ SOLN
INTRAMUSCULAR | Status: DC | PRN
  Administered 2021-01-17: 30 mL

## 2021-01-17 MED ORDER — LIDOCAINE HCL 1 % IJ SOLN
INTRAMUSCULAR | Status: AC
  Filled 2021-01-17: qty 20

## 2021-01-17 NOTE — Procedures (Signed)
PROCEDURE SUMMARY:  Successful US guided paracentesis from right abdomen.  Yielded 5L of clear yellow fluid.  No immediate complications.  Pt tolerated well.   EBL < 2 mL  Theresa Duty, NP 01/17/2021 11:00 AM

## 2021-01-19 ENCOUNTER — Inpatient Hospital Stay: Payer: Medicare Other | Attending: Oncology

## 2021-01-19 ENCOUNTER — Encounter: Payer: Self-pay | Admitting: Oncology

## 2021-01-19 ENCOUNTER — Other Ambulatory Visit: Payer: Self-pay

## 2021-01-19 VITALS — BP 123/64 | HR 71 | Temp 97.7°F | Resp 18

## 2021-01-19 DIAGNOSIS — M109 Gout, unspecified: Secondary | ICD-10-CM | POA: Insufficient documentation

## 2021-01-19 DIAGNOSIS — R609 Edema, unspecified: Secondary | ICD-10-CM | POA: Diagnosis not present

## 2021-01-19 DIAGNOSIS — K7469 Other cirrhosis of liver: Secondary | ICD-10-CM | POA: Diagnosis not present

## 2021-01-19 DIAGNOSIS — Z9049 Acquired absence of other specified parts of digestive tract: Secondary | ICD-10-CM | POA: Diagnosis not present

## 2021-01-19 DIAGNOSIS — D509 Iron deficiency anemia, unspecified: Secondary | ICD-10-CM | POA: Diagnosis present

## 2021-01-19 DIAGNOSIS — N2889 Other specified disorders of kidney and ureter: Secondary | ICD-10-CM | POA: Insufficient documentation

## 2021-01-19 DIAGNOSIS — I10 Essential (primary) hypertension: Secondary | ICD-10-CM | POA: Insufficient documentation

## 2021-01-19 DIAGNOSIS — Z86718 Personal history of other venous thrombosis and embolism: Secondary | ICD-10-CM | POA: Diagnosis not present

## 2021-01-19 DIAGNOSIS — R140 Abdominal distension (gaseous): Secondary | ICD-10-CM | POA: Diagnosis not present

## 2021-01-19 DIAGNOSIS — L539 Erythematous condition, unspecified: Secondary | ICD-10-CM | POA: Insufficient documentation

## 2021-01-19 DIAGNOSIS — C180 Malignant neoplasm of cecum: Secondary | ICD-10-CM | POA: Diagnosis not present

## 2021-01-19 DIAGNOSIS — E119 Type 2 diabetes mellitus without complications: Secondary | ICD-10-CM | POA: Insufficient documentation

## 2021-01-19 DIAGNOSIS — R161 Splenomegaly, not elsewhere classified: Secondary | ICD-10-CM | POA: Insufficient documentation

## 2021-01-19 DIAGNOSIS — Z79899 Other long term (current) drug therapy: Secondary | ICD-10-CM | POA: Diagnosis not present

## 2021-01-19 DIAGNOSIS — D649 Anemia, unspecified: Secondary | ICD-10-CM

## 2021-01-19 DIAGNOSIS — K922 Gastrointestinal hemorrhage, unspecified: Secondary | ICD-10-CM

## 2021-01-19 MED ORDER — LORATADINE 10 MG PO TABS
10.0000 mg | ORAL_TABLET | Freq: Once | ORAL | Status: AC
Start: 2021-01-19 — End: 2021-01-19
  Administered 2021-01-19: 10 mg via ORAL
  Filled 2021-01-19: qty 1

## 2021-01-19 MED ORDER — SODIUM CHLORIDE 0.9 % IV SOLN
Freq: Once | INTRAVENOUS | Status: AC
  Filled 2021-01-19: qty 250

## 2021-01-19 MED ORDER — PANTOPRAZOLE SODIUM 20 MG PO TBEC: 20.0000 mg | DELAYED_RELEASE_TABLET | Freq: Two times a day (BID) | ORAL | 1 refills | Status: DC

## 2021-01-19 MED ORDER — SODIUM CHLORIDE 0.9 % IV SOLN
300.0000 mg | Freq: Once | INTRAVENOUS | Status: AC
  Administered 2021-01-19: 300 mg via INTRAVENOUS
  Filled 2021-01-19: qty 15

## 2021-01-19 MED ORDER — METHYLPREDNISOLONE SODIUM SUCC 125 MG IJ SOLR
125.0000 mg | Freq: Once | INTRAMUSCULAR | Status: AC
  Administered 2021-01-19: 125 mg via INTRAVENOUS
  Filled 2021-01-19: qty 2

## 2021-01-19 MED ORDER — ACETAMINOPHEN 325 MG PO TABS
650.0000 mg | ORAL_TABLET | Freq: Once | ORAL | Status: AC
  Administered 2021-01-19: 650 mg via ORAL
  Filled 2021-01-19: qty 2

## 2021-01-19 MED ORDER — FAMOTIDINE 20 MG PO TABS
20.0000 mg | ORAL_TABLET | Freq: Once | ORAL | Status: AC
  Administered 2021-01-19: 20 mg via ORAL
  Filled 2021-01-19: qty 1

## 2021-01-19 NOTE — Patient Instructions (Signed)

## 2021-01-20 ENCOUNTER — Other Ambulatory Visit: Payer: Self-pay | Admitting: *Deleted

## 2021-01-20 DIAGNOSIS — K922 Gastrointestinal hemorrhage, unspecified: Secondary | ICD-10-CM

## 2021-01-20 DIAGNOSIS — C189 Malignant neoplasm of colon, unspecified: Secondary | ICD-10-CM

## 2021-01-20 NOTE — Progress Notes (Signed)
Confirmed w/IR that referral order needs to be changed to allow scheduler to see and initiate review process.  Order re-entered.

## 2021-01-23 ENCOUNTER — Telehealth: Payer: Self-pay | Admitting: *Deleted

## 2021-01-23 NOTE — Telephone Encounter (Signed)
Called Cheryl Jordan to schedule consult in IR referred by Dr Benay Spice and she states she wants to talk with Dr. Benay Spice first then she will call me back./vm

## 2021-01-26 ENCOUNTER — Inpatient Hospital Stay: Payer: Medicare Other

## 2021-01-26 ENCOUNTER — Telehealth: Payer: Self-pay | Admitting: *Deleted

## 2021-01-26 ENCOUNTER — Other Ambulatory Visit: Payer: Self-pay

## 2021-01-26 VITALS — BP 131/62 | HR 69 | Temp 97.9°F | Resp 18

## 2021-01-26 DIAGNOSIS — K922 Gastrointestinal hemorrhage, unspecified: Secondary | ICD-10-CM

## 2021-01-26 DIAGNOSIS — C180 Malignant neoplasm of cecum: Secondary | ICD-10-CM | POA: Diagnosis not present

## 2021-01-26 DIAGNOSIS — D649 Anemia, unspecified: Secondary | ICD-10-CM

## 2021-01-26 LAB — CBC WITH DIFFERENTIAL (CANCER CENTER ONLY)
Abs Immature Granulocytes: 0.01 10*3/uL (ref 0.00–0.07)
Basophils Absolute: 0 10*3/uL (ref 0.0–0.1)
Basophils Relative: 1 %
Eosinophils Absolute: 0.1 10*3/uL (ref 0.0–0.5)
Eosinophils Relative: 2 %
HCT: 30.5 % — ABNORMAL LOW (ref 36.0–46.0)
Hemoglobin: 9.1 g/dL — ABNORMAL LOW (ref 12.0–15.0)
Immature Granulocytes: 0 %
Lymphocytes Relative: 36 %
Lymphs Abs: 1.7 10*3/uL (ref 0.7–4.0)
MCH: 27.2 pg (ref 26.0–34.0)
MCHC: 29.8 g/dL — ABNORMAL LOW (ref 30.0–36.0)
MCV: 91 fL (ref 80.0–100.0)
Monocytes Absolute: 0.5 10*3/uL (ref 0.1–1.0)
Monocytes Relative: 9 %
Neutro Abs: 2.6 10*3/uL (ref 1.7–7.7)
Neutrophils Relative %: 52 %
Platelet Count: 125 10*3/uL — ABNORMAL LOW (ref 150–400)
RBC: 3.35 MIL/uL — ABNORMAL LOW (ref 3.87–5.11)
RDW: 15.7 % — ABNORMAL HIGH (ref 11.5–15.5)
WBC Count: 4.9 10*3/uL (ref 4.0–10.5)
nRBC: 0 % (ref 0.0–0.2)

## 2021-01-26 LAB — SAMPLE TO BLOOD BANK

## 2021-01-26 MED ORDER — LORATADINE 10 MG PO TABS
10.0000 mg | ORAL_TABLET | Freq: Once | ORAL | Status: AC
  Administered 2021-01-26: 10 mg via ORAL
  Filled 2021-01-26: qty 1

## 2021-01-26 MED ORDER — SODIUM CHLORIDE 0.9 % IV SOLN
Freq: Once | INTRAVENOUS | Status: AC
  Filled 2021-01-26: qty 250

## 2021-01-26 MED ORDER — METHYLPREDNISOLONE SODIUM SUCC 125 MG IJ SOLR
125.0000 mg | Freq: Once | INTRAMUSCULAR | Status: AC
  Administered 2021-01-26: 125 mg via INTRAVENOUS
  Filled 2021-01-26: qty 2

## 2021-01-26 MED ORDER — SODIUM CHLORIDE 0.9 % IV SOLN
300.0000 mg | Freq: Once | INTRAVENOUS | Status: AC
  Administered 2021-01-26: 300 mg via INTRAVENOUS
  Filled 2021-01-26: qty 15

## 2021-01-26 MED ORDER — ACETAMINOPHEN 325 MG PO TABS
650.0000 mg | ORAL_TABLET | Freq: Once | ORAL | Status: AC
Start: 2021-01-26 — End: 2021-01-26
  Administered 2021-01-26: 650 mg via ORAL
  Filled 2021-01-26: qty 2

## 2021-01-26 MED ORDER — FAMOTIDINE 20 MG PO TABS
20.0000 mg | ORAL_TABLET | Freq: Once | ORAL | Status: AC
  Administered 2021-01-26: 20 mg via ORAL
  Filled 2021-01-26: qty 1

## 2021-01-26 NOTE — Patient Instructions (Addendum)
Iron Sucrose injection What is this medication? IRON SUCROSE (AHY ern SOO krohs) is an iron complex. Iron is used to make healthy red blood cells, which carry oxygen and nutrients throughout the body. This medicine is used to treat iron deficiency anemia in people with chronickidney disease. This medicine may be used for other purposes; ask your health care provider orpharmacist if you have questions. COMMON BRAND NAME(S): Venofer What should I tell my care team before I take this medication? They need to know if you have any of these conditions: anemia not caused by low iron levels heart disease high levels of iron in the blood kidney disease liver disease an unusual or allergic reaction to iron, other medicines, foods, dyes, or preservatives pregnant or trying to get pregnant breast-feeding How should I use this medication? This medicine is for infusion into a vein. It is given by a health careprofessional in a hospital or clinic setting. Talk to your pediatrician regarding the use of this medicine in children. While this drug may be prescribed for children as young as 2 years for selectedconditions, precautions do apply. Overdosage: If you think you have taken too much of this medicine contact apoison control center or emergency room at once. NOTE: This medicine is only for you. Do not share this medicine with others. What if I miss a dose? It is important not to miss your dose. Call your doctor or health careprofessional if you are unable to keep an appointment. What may interact with this medication? Do not take this medicine with any of the following medications: deferoxamine dimercaprol other iron products This medicine may also interact with the following medications: chloramphenicol deferasirox This list may not describe all possible interactions. Give your health care provider a list of all the medicines, herbs, non-prescription drugs, or dietary supplements you use. Also tell  them if you smoke, drink alcohol, or use illegaldrugs. Some items may interact with your medicine. What should I watch for while using this medication? Visit your doctor or healthcare professional regularly. Tell your doctor or healthcare professional if your symptoms do not start to get better or if theyget worse. You may need blood work done while you are taking this medicine. You may need to follow a special diet. Talk to your doctor. Foods that contain iron include: whole grains/cereals, dried fruits, beans, or peas, leafy greenvegetables, and organ meats (liver, kidney). What side effects may I notice from receiving this medication? Side effects that you should report to your doctor or health care professionalas soon as possible: allergic reactions like skin rash, itching or hives, swelling of the face, lips, or tongue breathing problems changes in blood pressure cough fast, irregular heartbeat feeling faint or lightheaded, falls fever or chills flushing, sweating, or hot feelings joint or muscle aches/pains seizures swelling of the ankles or feet unusually weak or tired Side effects that usually do not require medical attention (report to yourdoctor or health care professional if they continue or are bothersome): diarrhea feeling achy headache irritation at site where injected nausea, vomiting stomach upset tiredness This list may not describe all possible side effects. Call your doctor for medical advice about side effects. You may report side effects to FDA at1-800-FDA-1088. Where should I keep my medication? This drug is given in a hospital or clinic and will not be stored at home. NOTE: This sheet is a summary. It may not cover all possible information. If you have questions about this medicine, talk to your doctor, pharmacist, orhealth care   provider.  2022 Elsevier/Gold Standard (2011-04-12 17:14:35)  Methylprednisolone Solution Injection What is this  medication? METHYLPREDNISOLONE (meth ill pred NISS oh lone) treats many conditions such as asthma, allergic reactions, arthritis, and inflammatory bowel diseases. It works by decreasing inflammation. It belongs to a group of medications calledsteroids. This medicine may be used for other purposes; ask your health care provider orpharmacist if you have questions. COMMON BRAND NAME(S): A-Methapred, Solu-Medrol What should I tell my care team before I take this medication? They need to know if you have any of these conditions: Cushing's syndrome Eye disease, vision problems Diabetes Glaucoma Heart disease High blood pressure Infection (especially a virus infection such as chickenpox, cold sores, or herpes) Liver disease Mental illness Myasthenia gravis Osteoporosis Recently received or scheduled to receive a vaccine Seizures Stomach or intestine problems Thyroid disease An unusual or allergic reaction to lactose, methylprednisolone, other medications, foods, dyes, or preservatives Pregnant or trying to get pregnant Breast-feeding How should I use this medication? This medication is for injection or infusion into a vein. It is also for injection into a muscle. It is given by your care team in a hospital or clinicsetting. Talk to your care team about the use of this medication in children. While thismedication may be prescribed for selected conditions, precautions do apply. Overdosage: If you think you have taken too much of this medicine contact apoison control center or emergency room at once. NOTE: This medicine is only for you. Do not share this medicine with others. What if I miss a dose? This does not apply. What may interact with this medication? Do not take this medication with any of the following: Alefacept Echinacea Iopamidol Live virus vaccines Metyrapone Mifepristone This medication may also interact with the following: Amphotericin B Aspirin and aspirin-like  medications Certain antibiotics like erythromycin, clarithromycin, troleandomycin Certain medications for diabetes Certain medications for fungal infection like ketoconazole Certain medications for seizures like carbamazepine, phenobarbital, phenytoin Certain medications that treat or prevent blood clots like warfarin Cyclosporine Digoxin Diuretics Female hormones, like estrogens and birth control pills Isoniazid NSAIDS, medications for pain and inflammation, like ibuprofen or naproxen Other medications for myasthenia gravis Rifampin Vaccines This list may not describe all possible interactions. Give your health care provider a list of all the medicines, herbs, non-prescription drugs, or dietary supplements you use. Also tell them if you smoke, drink alcohol, or use illegaldrugs. Some items may interact with your medicine. What should I watch for while using this medication? Tell your care team if your symptoms do not start to get better or if they get worse. Do not stop taking except on your care team's advice. You may develop asevere reaction. Your care team will tell you how much medication to take. Your condition will be monitored carefully while you are receiving thismedication. This medication may increase your risk of getting an infection. Tell your care team if you are around anyone with measles or chickenpox, or if you developsores or blisters that do not heal properly. This medication may increase blood sugar. Ask your care team if changes in dietor medications are needed if you have diabetes. Tell your care team right away if you have any change in your eyesight. Using this medication for a long time may increase your risk of low bone mass.Talk to your care team about bone health. What side effects may I notice from receiving this medication? Side effects that you should report to your care team as soon as possible: Allergic reactions-skin rash, itching, hives,  swelling of the  face, lips, tongue, or throat Cushing syndrome-increased fat around the midsection, upper back, neck, or face, pink or purple stretch marks on the skin, thinning, fragile skin that easily bruises, unexpected hair growth High blood sugar (hyperglycemia)-increased thirst or amount of urine, unusual weakness or fatigue, blurry vision Increase in blood pressure Infection-fever, chills, cough, sore throat, wounds that don't heal, pain or trouble when passing urine, general feeling of discomfort or being unwell Low adrenal gland function-nausea, vomiting, loss of appetite, unusual weakness or fatigue, dizziness Mood and behavior changes-anxiety, nervousness, confusion, hallucinations, irritability, hostility, thoughts of suicide or self-harm, worsening mood, feelings of depression Stomach bleeding-bloody or black, tar-like stools, vomiting blood or brown material that looks like coffee grounds Swelling of the ankles, hands, or feet Side effects that usually do not require medical attention (report to your careteam if they continue or are bothersome): Acne General discomfort and fatigue Headache Increase in appetite Nausea Trouble sleeping Weight gain This list may not describe all possible side effects. Call your doctor for medical advice about side effects. You may report side effects to FDA at1-800-FDA-1088. Where should I keep my medication? This medication is given in a hospital or clinic and will not be stored at home. NOTE: This sheet is a summary. It may not cover all possible information. If you have questions about this medicine, talk to your doctor, pharmacist, orhealth care provider.  2022 Elsevier/Gold Standard (2020-08-08 16:04:48)  Acetaminophen Capsules or Tablets What is this medication? ACETAMINOPHEN (a set a MEE noe fen) treats mild to moderate pain. It may alsobe used to reduce fever. This medicine may be used for other purposes; ask your health care provider orpharmacist if  you have questions. COMMON BRAND NAME(S): Aceta, Actamin, Anacin Aspirin Free, Genapap, Genebs, Mapap, Pain & Fever, Pain and Fever, PAIN RELIEF, PAIN RELIEF Extra Strength, Pain Reliever, Panadol, PHARBETOL, Plus PHARMA, Q-Pap, Q-Pap Extra Strength, Tylenol, Tylenol CrushableTablet, Tylenol Extra Strength, Tylenol RegularStrength, XS No Aspirin, XS Pain Reliever What should I tell my care team before I take this medication? They need to know if you have any of these conditions: If you often drink alcohol Liver disease An unusual or allergic reaction to acetaminophen, other medications, foods, dyes, or preservatives Pregnant or trying to get pregnant Breast-feeding How should I use this medication? Take this medication by mouth with a glass of water. Follow the directions on the package or prescription label. Take your medication at regular intervals.Do not take your medication more often than directed. Talk to your care team about the use of this medication in children. While this medication may be prescribed for children as young as 76 years of age forselected conditions, precautions do apply. Overdosage: If you think you have taken too much of this medicine contact apoison control center or emergency room at once. NOTE: This medicine is only for you. Do not share this medicine with others. What if I miss a dose? If you miss a dose, take it as soon as you can. If it is almost time for yournext dose, take only that dose. Do not take double or extra doses. What may interact with this medication? Alcohol Imatinib Isoniazid Other medications with acetaminophen This list may not describe all possible interactions. Give your health care provider a list of all the medicines, herbs, non-prescription drugs, or dietary supplements you use. Also tell them if you smoke, drink alcohol, or use illegaldrugs. Some items may interact with your medicine. What should I watch for while  using this  medication? Tell your care team if the pain lasts more than 10 days (5 days for children), if it gets worse, or if there is a new or different kind of pain. Also, checkwith your care team if a fever lasts for more than 3 days. Do not take other medications that contain acetaminophen with this one. Alwaysread labels carefully. If you have questions, ask your care team. If you take too much acetaminophen, get medical help right away. Too much acetaminophen can be very dangerous and cause liver damage. Even if you do nothave symptoms, it is important to get help right away. What side effects may I notice from receiving this medication? Side effects that you should report to your care team as soon as possible: Allergic reactions-skin rash, itching, hives, swelling of the face, lips, tongue, or throat Liver injury-right upper belly pain, loss of appetite, nausea, light-colored stool, dark yellow or brown urine, yellowing skin or eyes, unusual weakness or fatigue Redness, blistering, peeling, or loosening of the skin, including inside the mouth Side effects that usually do not require medical attention (report to your careteam if they continue or are bothersome): Headache Nausea Trouble sleeping Upset stomach This list may not describe all possible side effects. Call your doctor for medical advice about side effects. You may report side effects to FDA at1-800-FDA-1088. Where should I keep my medication? Keep out of reach of children and pets. Store at room temperature between 20 and 25 degrees C (68 and 77 degrees F). Protect from moisture and heat. Throw away any unused medication after theexpiration date. NOTE: This sheet is a summary. It may not cover all possible information. If you have questions about this medicine, talk to your doctor, pharmacist, orhealth care provider.  2022 Elsevier/Gold Standard (2020-05-17 11:07:55)  Loratadine capsules or tablets What is this medication? LORATADINE  (lor AT a deen) is an antihistamine. It helps to relieve sneezing, runny nose, and itchy, watery eyes. This medicine is used to treat the symptomsof allergies. It is also used to treat itchy skin rash and hives. This medicine may be used for other purposes; ask your health care provider orpharmacist if you have questions. COMMON BRAND NAME(S): Alavert, Allergy Relief, Claritin, Claritin Hives Relief, Claritin Liqui-Gel, Claritin-D 24 Hour, Clear-Atadine, QlearQuil All Day & AllNight Allergy Relief, Tavist ND What should I tell my care team before I take this medication? They need to know if you have any of these conditions: asthma kidney disease liver disease an unusual or allergic reaction to loratadine, other antihistamines, other medicines, foods, dyes, or preservatives pregnant or trying to get pregnant breast-feeding How should I use this medication? Take this medicine by mouth with a glass of water. Follow the directions on the label. You may take this medicine with food or on an empty stomach. Take your medicine at regular intervals. Do not take your medicine more often thandirected. Talk to your pediatrician regarding the use of this medicine in children. While this medicine may be used in children as young as 6 years for selectedconditions, precautions do apply. Overdosage: If you think you have taken too much of this medicine contact apoison control center or emergency room at once. NOTE: This medicine is only for you. Do not share this medicine with others. What if I miss a dose? If you miss a dose, take it as soon as you can. If it is almost time for yournext dose, take only that dose. Do not take double or extra doses. What  may interact with this medication? other medicines for colds or allergies This list may not describe all possible interactions. Give your health care provider a list of all the medicines, herbs, non-prescription drugs, or dietary supplements you use. Also tell  them if you smoke, drink alcohol, or use illegaldrugs. Some items may interact with your medicine. What should I watch for while using this medication? Tell your doctor or healthcare professional if your symptoms do not start toget better or if they get worse. Your mouth may get dry. Chewing sugarless gum or sucking hard candy, and drinking plenty of water may help. Contact your doctor if the problem does notgo away or is severe. You may get drowsy or dizzy. Do not drive, use machinery, or do anything that needs mental alertness until you know how this medicine affects you. Do not stand or sit up quickly, especially if you are an older patient. This reducesthe risk of dizzy or fainting spells. What side effects may I notice from receiving this medication? Side effects that you should report to your doctor or health care professionalas soon as possible: allergic reactions like skin rash, itching or hives, swelling of the face, lips, or tongue breathing problems unusually restless or nervous Side effects that usually do not require medical attention (report to yourdoctor or health care professional if they continue or are bothersome): drowsiness dry or irritated mouth or throat headache This list may not describe all possible side effects. Call your doctor for medical advice about side effects. You may report side effects to FDA at1-800-FDA-1088. Where should I keep my medication? Keep out of the reach of children. Store at room temperature between 2 and 30 degrees C (36 and 86 degrees F).Protect from moisture. Throw away any unused medicine after the expiration date. NOTE: This sheet is a summary. It may not cover all possible information. If you have questions about this medicine, talk to your doctor, pharmacist, orhealth care provider.  2022 Elsevier/Gold Standard (2008-01-05 17:17:24)  Famotidine Tablets What is this medication? FAMOTIDINE (fa MOE ti deen) treats heartburn, stomach ulcers,  reflux disease, or other conditions that cause too much stomach acid. It works by reducing theamount of acid in the stomach. This medicine may be used for other purposes; ask your health care provider orpharmacist if you have questions. COMMON BRAND NAME(S): Heartburn Relief, Pepcid, Pepcid AC, Pepcid AC MaximumStrength, Zantac What should I tell my care team before I take this medication? They need to know if you have any of these conditions: Kidney disease Liver disease Trouble swallowing An unusual or allergic reaction to famotidine, other medications, foods, dyes, or preservatives Pregnant or trying to get pregnant Breast-feeding How should I use this medication? Take this medication by mouth with a glass of water. Follow the directions on the prescription label. If you only take this medication once a day, take it at bedtime. Take your doses at regular intervals. Do not take your medication moreoften than directed. Talk to your care team about the use of this medication in children. Specialcare may be needed. Overdosage: If you think you have taken too much of this medicine contact apoison control center or emergency room at once. NOTE: This medicine is only for you. Do not share this medicine with others. What if I miss a dose? If you miss a dose, take it as soon as you can. If it is almost time for yournext dose, take only that dose. Do not take double or extra doses. What may interact with  this medication? Delavirdine Itraconazole Ketoconazole This list may not describe all possible interactions. Give your health care provider a list of all the medicines, herbs, non-prescription drugs, or dietary supplements you use. Also tell them if you smoke, drink alcohol, or use illegaldrugs. Some items may interact with your medicine. What should I watch for while using this medication? Tell your care team if your condition does not start to get better or if itgets worse. Do not take with  aspirin, ibuprofen or other antiinflammatory medications.These can make your condition worse. Do not smoke cigarettes or drink alcohol. These cause irritation in yourstomach and can increase the time it will take for ulcers to heal. If you get black, tarry stools or vomit up what looks like coffee grounds, callyour care team at once. You may have a bleeding ulcer. This medication may cause a decrease in vitamin B12. You should make sure that you get enough vitamin B12 while you are taking this medication. Discuss thefoods you eat and the vitamins you take with your care team. What side effects may I notice from receiving this medication? Side effects that you should report to your care team as soon as possible: Allergic reactions-skin rash, itching, hives, swelling of the face, lips, tongue, or throat Confusion Hallucinations Side effects that usually do not require medical attention (report to your careteam if they continue or are bothersome): Constipation Diarrhea Dizziness Headache This list may not describe all possible side effects. Call your doctor for medical advice about side effects. You may report side effects to FDA at1-800-FDA-1088. Where should I keep my medication? Keep out of the reach of children and pets. Store at room temperature between 15 and 30 degrees C (59 and 86 degrees F). Donot freeze. Throw away any unused medication after the expiration date. NOTE: This sheet is a summary. It may not cover all possible information. If you have questions about this medicine, talk to your doctor, pharmacist, orhealth care provider.  2022 Elsevier/Gold Standard (2020-05-18 10:36:47)

## 2021-01-26 NOTE — Progress Notes (Signed)
Pt declined full 30 minute observation period after receiving Venofer. Pt had no sign of reaction.

## 2021-01-26 NOTE — Telephone Encounter (Signed)
Left VM w/Vicki at [facility] to f/u on status of order for IR Eval for consideration of liver ablation.

## 2021-02-02 ENCOUNTER — Other Ambulatory Visit: Payer: Self-pay

## 2021-02-02 ENCOUNTER — Inpatient Hospital Stay: Payer: Medicare Other | Admitting: Oncology

## 2021-02-02 ENCOUNTER — Telehealth: Payer: Self-pay | Admitting: *Deleted

## 2021-02-02 ENCOUNTER — Inpatient Hospital Stay: Payer: Medicare Other

## 2021-02-02 VITALS — BP 137/61 | HR 67 | Temp 97.3°F | Resp 18 | Wt 215.2 lb

## 2021-02-02 DIAGNOSIS — C180 Malignant neoplasm of cecum: Secondary | ICD-10-CM | POA: Diagnosis not present

## 2021-02-02 DIAGNOSIS — D649 Anemia, unspecified: Secondary | ICD-10-CM

## 2021-02-02 LAB — CBC WITH DIFFERENTIAL (CANCER CENTER ONLY)
Abs Immature Granulocytes: 0.02 10*3/uL (ref 0.00–0.07)
Basophils Absolute: 0 10*3/uL (ref 0.0–0.1)
Basophils Relative: 0 %
Eosinophils Absolute: 0.1 10*3/uL (ref 0.0–0.5)
Eosinophils Relative: 2 %
HCT: 31.5 % — ABNORMAL LOW (ref 36.0–46.0)
Hemoglobin: 9.4 g/dL — ABNORMAL LOW (ref 12.0–15.0)
Immature Granulocytes: 0 %
Lymphocytes Relative: 24 %
Lymphs Abs: 1.5 10*3/uL (ref 0.7–4.0)
MCH: 27.6 pg (ref 26.0–34.0)
MCHC: 29.8 g/dL — ABNORMAL LOW (ref 30.0–36.0)
MCV: 92.6 fL (ref 80.0–100.0)
Monocytes Absolute: 0.5 10*3/uL (ref 0.1–1.0)
Monocytes Relative: 9 %
Neutro Abs: 3.9 10*3/uL (ref 1.7–7.7)
Neutrophils Relative %: 65 %
Platelet Count: 119 10*3/uL — ABNORMAL LOW (ref 150–400)
RBC: 3.4 MIL/uL — ABNORMAL LOW (ref 3.87–5.11)
RDW: 16.6 % — ABNORMAL HIGH (ref 11.5–15.5)
WBC Count: 6 10*3/uL (ref 4.0–10.5)
nRBC: 0 % (ref 0.0–0.2)

## 2021-02-02 LAB — SAMPLE TO BLOOD BANK

## 2021-02-02 MED ORDER — PREDNISONE 5 MG PO TABS: ORAL_TABLET | ORAL | 0 refills | Status: AC

## 2021-02-02 MED ORDER — PREDNISONE 5 MG PO TABS: ORAL_TABLET | ORAL | 0 refills | Status: DC

## 2021-02-02 NOTE — Progress Notes (Signed)
[place] OFFICE PROGRESS NOTE   Diagnosis: Anemia, HCC  INTERVAL HISTORY:   Ms. Nayak returns as scheduled.  She received Venofer on 01/19/2021 and 01/26/2021.  She complains of increased abdominal distention.  She is seeing Dr. Henrene Pastor next week.  She has a "gout "flare in the left great toe.  She has not seen interventional radiology.  Objective:  Vital signs in last 24 hours:  Blood pressure 137/61, pulse 67, temperature (!) 97.3 F (36.3 C), temperature source Temporal, resp. rate 18, weight 215 lb 3.2 oz (97.6 kg), SpO2 96 %.    Resp: Lungs clear bilaterally Cardio: Regular rate and rhythm GI: Distended with ascites, no hepatosplenomegaly Vascular: Trace edema to lower leg bilaterally Musculoskeletal: Erythema, tenderness, and pain with motion at the left first MTP joint   Lab Results:  Lab Results  Component Value Date   WBC 6.0 02/02/2021   HGB 9.4 (L) 02/02/2021   HCT 31.5 (L) 02/02/2021   MCV 92.6 02/02/2021   PLT 119 (L) 02/02/2021   NEUTROABS 3.9 02/02/2021    CMP  Lab Results  Component Value Date   NA 142 01/12/2021   K 4.7 01/12/2021   CL 109 01/12/2021   CO2 25 01/12/2021   GLUCOSE 97 01/12/2021   BUN 24 (H) 01/12/2021   CREATININE 1.91 (H) 01/12/2021   CALCIUM 8.6 (L) 01/12/2021   PROT 5.9 (L) 01/12/2021   ALBUMIN 3.1 (L) 01/12/2021   AST 19 01/12/2021   ALT 10 01/12/2021   ALKPHOS 116 01/12/2021   BILITOT 0.7 01/12/2021   GFRNONAA 27 (L) 01/12/2021   GFRAA 59 (L) 11/17/2017    Lab Results  Component Value Date   CEA1 3.24 06/30/2020   CEA 3.77 01/12/2021     Medications: I have reviewed the patient's current medications.   Assessment/Plan: Stage IIIc (T4b,N2b) moderately differentiated adenocarcinoma the cecum, status post a right colectomy 02/10/2015, tumor invades through the serosa and into adjacent small bowel, lymphovascular and perineural invasion present, resection margins negative, 8 of 20 lymph nodes positive  for metastatic carcinoma. Loss of MLH1 and PMS2 expression Microsatellite instability-high CT chest 03/03/2015-negative for metastatic disease Cycle 1 adjuvant Xeloda 03/18/2015 Cycle 2 adjuvant Xeloda 04/09/2015 Cycle 3 adjuvant Xeloda 04/30/2015 Cycle 4 adjuvant Xeloda 05/21/2015 Cycle 5 adjuvant Xeloda 06/11/2015 (dose reduced due to desquamation of feet). Cycle 6 adjuvant Xeloda 07/02/2015 Cycle 7 adjuvant Xeloda 07/23/2015 Cycle 8 adjuvant Xeloda 08/13/2015 CTs chest, abdomen, and pelvis 02/06/2016-cirrhosis, heterogenous enhancement of the liver, small hypoenhancing lesions in the liver-? 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 Likely regenerative nodules in the liver; does not appear as [place] or metastases. Left renal mass very slow growing. CT scan recommended in one year) Colonoscopy 08/06/2016-multiple 5-15 mm very subtle sessile serrated adenomas found in the transverse colon and at the anastomosis. Next colonoscopy at a one-year interval. CT 12/23/2017- no evidence of recurrent colon cancer, cirrhotic liver with no dominant mass, left renal mass-cystic component mildly larger compared to 2017 Colonoscopy 06/17/2020-hyperplastic polyps and a tubular adenoma removed from the rectum and colon     2. Anemia, microcytic. 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 She is followed by Dr. Alyson Ingles, urology. Unchanged on CT 02/06/2016, unchanged on MRI 12/06/2016, cystic component slightly larger on CT 12/23/2017 Diabetes Hypertension History of  mild hand/foot syndrome secondary to Xeloda Enlarging segment 8 liver lesion noted on MRI 12/06/2016-felt to potentially represent a metastasis or HCC Ultrasound-guided biopsy of the lesion on  02/06/2017 revealed cirrhosis, no malignancy CT 12/23/2017- chronic liver nodularity, no dominant mass, changes of cirrhosis CT 04/29/2020- enlarging right liver lesion concerning for [facility], separate from the lesion biopsied in 2018 CT 12/13/2020- enlargement of right hepatic lesion, probable second more subtle lesion in the posterior right liver, progression of ascites with nodularity of the omentum-edema related? MRI liver 01/03/2021-2.9 x 2.6 cm segment 7 lesion, Li-RADS 5, second segment 6 lesion, 1.1 cm, hemangioma or septated cyst, cirrhosis, splenomegaly, small left pleural effusion-unchanged Cirrhosis History of mild thrombocytopenia-likely secondary to cirrhosis 10. 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  Gout flare left great toe 02/02/2021-short course of prednisone      Disposition: The hemoglobin has improved.  She will return for a CBC and ferritin level in 1 month.  I encouraged her to schedule an appointment with interventional radiology to be evaluated for treatment of the segment 7 liver lesion.  She will see Dr. Henrene Pastor next week and will request a paracentesis.  I prescribed a short course of prednisone for the gout flare of the left great toe.  [name], MD  02/02/2021  12:00 PM

## 2021-02-02 NOTE — Telephone Encounter (Signed)
Called IR to f/u on order placed to for evaluation/management to determine if patient is candidate for liver ablation. Lanny Hurst will f/u with Leonard Downing, [place].

## 2021-02-06 ENCOUNTER — Encounter: Payer: Self-pay | Admitting: Internal Medicine

## 2021-02-06 ENCOUNTER — Ambulatory Visit: Payer: Medicare Other | Admitting: Internal Medicine

## 2021-02-06 VITALS — BP 140/70 | HR 75 | Ht 67.0 in | Wt 216.4 lb

## 2021-02-06 DIAGNOSIS — K219 Gastro-esophageal reflux disease without esophagitis: Secondary | ICD-10-CM

## 2021-02-06 DIAGNOSIS — K7460 Unspecified cirrhosis of liver: Secondary | ICD-10-CM | POA: Diagnosis not present

## 2021-02-06 DIAGNOSIS — D509 Iron deficiency anemia, unspecified: Secondary | ICD-10-CM | POA: Diagnosis not present

## 2021-02-06 DIAGNOSIS — R188 Other ascites: Secondary | ICD-10-CM

## 2021-02-06 DIAGNOSIS — K769 Liver disease, unspecified: Secondary | ICD-10-CM

## 2021-02-06 MED ORDER — ALBUMIN HUMAN 25 % IV SOLN: 12.5000 g | Freq: Once | INTRAVENOUS | Status: DC

## 2021-02-06 NOTE — Patient Instructions (Addendum)
If you are age 79 or older, your body mass index should be between 23-30. Your Body mass index is 33.89 kg/m. If this is out of the aforementioned range listed, please consider follow up with your Primary Care Provider. _________________________________________________________ The [place] [facility] providers would like to encourage you to use [facility] to communicate with providers for non-urgent requests or questions.  Due to long hold times on the telephone, sending your provider a message by [facility] may be a faster and more efficient way to get a response.  Please allow 48 business hours for a response.  Please remember that this is for non-urgent requests.   You have been scheduled for an abdominal paracentesis at [facility] radiology (1st floor of hospital) on 02-08-21 at 9:00am. Please arrive at least 15 minutes prior to your appointment time for registration. Should you need to reschedule this appointment for any reason, please call our office at [phone].  Your provider has requested that you go to the basement level for lab work in 4 weeks (week of August 2022).  Press "B" on the elevator. The lab is located at the first door on the left as you exit the elevator.  Due to recent changes in healthcare laws, you may see the results of your imaging and laboratory studies on MyChart before your provider has had a chance to review them.  We understand that in some cases there may be results that are confusing or concerning to you. Not all laboratory results come back in the same time frame and the provider may be waiting for multiple results in order to interpret others.  Please give [place] 48 hours in order for your provider to thoroughly review all the results before contacting the office for clarification of your results.   We will see you in follow up on 05-11-2021 at 11:20am.  Thank you for entrusting me with your care and choosing [facility].  Dr Henrene Pastor

## 2021-02-06 NOTE — Progress Notes (Signed)
HISTORY OF PRESENT ILLNESS:  Cheryl Jordan is a 80 y.o. female with: 1.  NASH cirrhosis.  Complications include refractory ascites.  Intolerance to spironolactone (hyperkalemia).  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 2.  Renal insufficiency 3.  Possible [place] right liver lesion.  Being evaluated.  Followed by oncology.  Last MRI January 03, 2021 with 2.9 x 2.6 hyperenhancing lesion of the right liver, segment 7. 4.  History of DVT.  No longer on chronic anticoagulation therapy 5.  Multifactorial anemia 6.  General medical problems include hypertension, diabetes. 7.  Multifactorial and iron deficiency anemia.  Recurrent.  Followed by hematology.  Recent IV iron infusion. 8.  History of colon cancer.  Colonoscopy December 2021  Patient presents today for follow-up she was last seen in this office September 21, 2020.  She continues on oral iron therapy.  Received IV iron infusion couple weeks ago.  Feeling stronger since.  Remains on Lasix 60 mg daily as her [place] diuretic.  Continues on omeprazole 40 mg daily for GERD.  She is also on propranolol.  Last paracentesis 3 weeks ago.  5 L.  No SBP.  Complains of recurrent ascites.  She requests paracentesis.  She also complains of lower extremity swelling which is uncomfortable.  Recent problems with gout.  She has a scheduled consultation with interventional radiology for consideration of ablation of the right liver lesion.  Her only other GI complaint is intermittent postprandial epigastric discomfort which lasts about 10 minutes.  Status postcholecystectomy.  Review of blood work from January 12, 2021 shows normal liver tests.  Hemoglobin 8.4.  Labs from January 26, 2021 shows hemoglobin 9.1.  REVIEW OF SYSTEMS:  All non-GI ROS negative as otherwise stated in the HPI except for anxiety, arthritis, heart murmur, shortness of breath, sleeping problems  Past Medical History:  Diagnosis Date   Allergy     Anemia    hx of   Anxiety    Arthritis    Colon cancer ([place]) dx'd 11/2014   Diabetes mellitus without complication (HCC)    TYPE II   Fatty liver    Gallbladder disease    GERD (gastroesophageal reflux disease)    History of gout    Hypertension    Hypothyroidism    Kidney disorder    spot on left kidney   Osteoporosis    PONV (postoperative nausea and vomiting)     Past Surgical History:  Procedure Laterality Date   BIOPSY  06/17/2020   Procedure: BIOPSY;  Surgeon: Irving Copas., MD;  Location: [facility];  Service: Gastroenterology;;   CHOLECYSTECTOMY     COLONOSCOPY WITH PROPOFOL N/A 06/17/2020   Procedure: COLONOSCOPY WITH PROPOFOL;  Surgeon: Irving Copas., MD;  Location: [facility];  Service: Gastroenterology;  Laterality: N/A;   COLONOSCOPY WITH PROPOFOL N/A 07/01/2020   Procedure: COLONOSCOPY WITH PROPOFOL;  Surgeon: Mauri Pole, MD;  Location: [facility];  Service: Endoscopy;  Laterality: N/A;   ESOPHAGOGASTRODUODENOSCOPY (EGD) WITH PROPOFOL N/A 06/17/2020   Procedure: ESOPHAGOGASTRODUODENOSCOPY (EGD) WITH PROPOFOL;  Surgeon: Rush Landmark Telford Nab., MD;  Location: [facility];  Service: Gastroenterology;  Laterality: N/A;   ESOPHAGOGASTRODUODENOSCOPY (EGD) WITH PROPOFOL N/A 07/01/2020   Procedure: ESOPHAGOGASTRODUODENOSCOPY (EGD) WITH PROPOFOL;  Surgeon: Mauri Pole, MD;  Location: [facility];  Service: Endoscopy;  Laterality: N/A;   FOREIGN BODY REMOVAL  06/17/2020   Procedure: FOREIGN BODY REMOVAL;  Surgeon: Rush Landmark Telford Nab., MD;  Location: [facility];  Service: Gastroenterology;;  GI RADIOFREQUENCY ABLATION  06/17/2020   Procedure: GI RADIOFREQUENCY ABLATION;  Surgeon: Rush Landmark Telford Nab., MD;  Location: [facility];  Service: Gastroenterology;;   HEMOSTASIS CLIP PLACEMENT  06/17/2020   Procedure: HEMOSTASIS CLIP PLACEMENT;  Surgeon: Irving Copas., MD;  Location: [facility];  Service: Gastroenterology;;    HEMOSTASIS CLIP PLACEMENT  07/01/2020   Procedure: HEMOSTASIS CLIP PLACEMENT;  Surgeon: Mauri Pole, MD;  Location: [facility];  Service: Endoscopy;;   HOT HEMOSTASIS N/A 07/01/2020   Procedure: HOT HEMOSTASIS (ARGON PLASMA COAGULATION/BICAP);  Surgeon: Mauri Pole, MD;  Location: [facility];  Service: Endoscopy;  Laterality: N/A;   IR PARACENTESIS  08/19/2020   IR PARACENTESIS  09/26/2020   IR PARACENTESIS  11/03/2020   IR PARACENTESIS  11/28/2020   IR PARACENTESIS  12/21/2020   IR PARACENTESIS  01/17/2021   IR RADIOLOGIST EVAL & MGMT  01/24/2017   KNEE CARTILAGE SURGERY Bilateral    NEPHRECTOMY Left 02/10/2015   Procedure: OPEN RETROPERINTONEAL EXPLORATION LEFT RENAL  CYST DECORTICATION X 5;  Surgeon: Cleon Gustin, MD;  Location: [facility];  Service: Urology;  Laterality: Left;   PARTIAL COLECTOMY N/A 02/10/2015   Procedure: OPEN RIGHT  COLECTOMY ;  Surgeon: [name], MD;  Location: [facility];  Service: General;  Laterality: N/A;   POLYPECTOMY  06/17/2020   Procedure: POLYPECTOMY;  Surgeon: Rush Landmark Telford Nab., MD;  Location: [facility];  Service: Gastroenterology;;   TUBAL LIGATION      Social History Cheryl Jordan  reports that she has never smoked. She has never used smokeless tobacco. She reports that she does not drink alcohol and does not use drugs.  family history includes COPD in her brother, brother, and father; Diabetes in her mother; GER disease in her sister; Heart attack in her brother; Hypertension in her brother, sister, and another family member; Lung cancer in her brother.  Allergies  Allergen Reactions   Aspirin Other (See Comments)    Runny nose    Codeine Nausea And Vomiting       PHYSICAL EXAMINATION: Vital signs: BP 140/70   Pulse 75   Ht 5' 7" (1.702 m)   Wt 216 lb 6.4 oz (98.2 kg)   SpO2 98%   BMI 33.89 kg/m   Constitutional: Pleasant, generally well-appearing, no acute distress Psychiatric: alert and oriented x3,  cooperative Eyes: extraocular movements intact, anicteric, conjunctiva pink Mouth: oral pharynx moist, no lesions Neck: supple no lymphadenopathy Cardiovascular: heart regular rate and rhythm, no murmur Lungs: clear to auscultation bilaterally Abdomen: soft, nontender, distended, somewhat tense with obvious ascites,, no peritoneal signs, normal bowel sounds, no organomegaly Rectal: Omitted Extremities: no clubbing or cyanosis.  2+ lower extremity edema bilaterally Skin: no relevant lesions on visible extremities Neuro: No focal deficits. No asterixis.     ASSESSMENT:  1.  NASH cirrhosis with portal hypertension. 2.  Refractory ascites.  Currently with the same.  Symptomatic 3.  Right liver HCC.  Being evaluated 4.  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  History of colon cancer.  Last colonoscopy December 2021 6.  Chronic renal insufficiency 7.  History of DVT.  Now off of chronic anticoagulation therapy   PLAN:  1.  Continue Lasix 60 mg daily 2.  Schedule ultrasound-guided paracentesis.  Up to 5 L.  IV albumin replacement to be given.  Check fluid cell count with differential. 3.  Basic metabolic panel in 1 month. 4.  Consider increasing Lasix 5.  2 g  sodium diet 6.  Continue PPI 7.  Continue iron and propranolol 8.  Support stockings for the lower extremity edema 9.  Routine GI office follow-up 3 months.  Contact the office in the interim for any questions or problems  A total time of 45 minutes was spent preparing to see the patient, reviewing a myriad of tests (laboratories, x-rays), obtaining comprehensive history, performing medically appropriate physical examination, counseling the patient regarding her multiple above listed issues, ordering blood work and paracentesis, arranging follow-up, and documenting clinical information in the health record     

## 2021-02-08 ENCOUNTER — Other Ambulatory Visit: Payer: Self-pay

## 2021-02-08 ENCOUNTER — Ambulatory Visit (HOSPITAL_COMMUNITY)
Admission: RE | Admit: 2021-02-08 | Discharge: 2021-02-08 | Disposition: A | Discharge status: Home / Self Care | Payer: Medicare Other | Source: Ambulatory Visit | Attending: Internal Medicine | Admitting: Internal Medicine

## 2021-02-08 DIAGNOSIS — R188 Other ascites: Secondary | ICD-10-CM | POA: Diagnosis present

## 2021-02-08 DIAGNOSIS — K7460 Unspecified cirrhosis of liver: Secondary | ICD-10-CM

## 2021-02-08 HISTORY — PX: IR PARACENTESIS: IMG2679

## 2021-02-08 LAB — BODY FLUID CELL COUNT WITH DIFFERENTIAL
Eos, Fluid: 0 %
Lymphs, Fluid: 59 %
Monocyte-Macrophage-Serous Fluid: 33 % — ABNORMAL LOW (ref 50–90)
Neutrophil Count, Fluid: 8 % (ref 0–25)
Total Nucleated Cell Count, Fluid: 111 cu mm (ref 0–1000)

## 2021-02-08 MED ORDER — LIDOCAINE HCL (PF) 1 % IJ SOLN
INTRAMUSCULAR | Status: DC | PRN
  Administered 2021-02-08: 10 mL

## 2021-02-08 MED ORDER — ALBUMIN HUMAN 25 % IV SOLN
12.5000 g | Freq: Once | INTRAVENOUS | Status: AC
  Administered 2021-02-08: 12.5 g via INTRAVENOUS
  Filled 2021-02-08: qty 50

## 2021-02-08 MED ORDER — LIDOCAINE HCL 1 % IJ SOLN
INTRAMUSCULAR | Status: AC
  Filled 2021-02-08: qty 20

## 2021-02-10 LAB — PATHOLOGIST SMEAR REVIEW

## 2021-02-21 ENCOUNTER — Telehealth: Payer: Self-pay | Admitting: Internal Medicine

## 2021-02-21 ENCOUNTER — Other Ambulatory Visit: Payer: Self-pay

## 2021-02-21 DIAGNOSIS — R188 Other ascites: Secondary | ICD-10-CM

## 2021-02-21 DIAGNOSIS — K7460 Unspecified cirrhosis of liver: Secondary | ICD-10-CM

## 2021-02-21 NOTE — Telephone Encounter (Signed)
Pt calling to schedule a paracentesis. Reports the fluid has come back quicker this time and she is uncomfortable. She has not weighed but states her clothes are fitting tighter and when she tries to lay down at night she has some SOB. Please advise.

## 2021-02-21 NOTE — Telephone Encounter (Signed)
Pt scheduled for IR para at [facility] 02/23/21 at 9:45am. Pt to come tomorrow for labs and knows to repeat labs in 1 week following para. Orders in epic. Pt aware.

## 2021-02-21 NOTE — Telephone Encounter (Signed)
Patient calling to schedule paracentesis

## 2021-02-21 NOTE — Telephone Encounter (Signed)
1.  Basic metabolic panel now 2.  Schedule ultrasound-guided paracentesis.  Removed up to 8 L.  Provide IV albumin replacement per protocol. 3.  Send fluid for cell count with differential 4..  Repeat basic metabolic panel 1 week after paracentesis

## 2021-02-22 ENCOUNTER — Other Ambulatory Visit (INDEPENDENT_AMBULATORY_CARE_PROVIDER_SITE_OTHER): Payer: Medicare Other

## 2021-02-22 ENCOUNTER — Ambulatory Visit
Admission: RE | Admit: 2021-02-22 | Discharge: 2021-02-22 | Disposition: A | Discharge status: Home / Self Care | Payer: Medicare Other | Source: Ambulatory Visit | Attending: Oncology | Admitting: Oncology

## 2021-02-22 ENCOUNTER — Encounter: Payer: Self-pay | Admitting: *Deleted

## 2021-02-22 DIAGNOSIS — C189 Malignant neoplasm of colon, unspecified: Secondary | ICD-10-CM

## 2021-02-22 DIAGNOSIS — R188 Other ascites: Secondary | ICD-10-CM | POA: Diagnosis not present

## 2021-02-22 HISTORY — PX: IR RADIOLOGIST EVAL & MGMT: IMG5224

## 2021-02-22 LAB — BASIC METABOLIC PANEL
BUN: 30 mg/dL — ABNORMAL HIGH (ref 6–23)
CO2: 26 mEq/L (ref 19–32)
Calcium: 8.7 mg/dL (ref 8.4–10.5)
Chloride: 106 mEq/L (ref 96–112)
Creatinine, Ser: 2.1 mg/dL — ABNORMAL HIGH (ref 0.40–1.20)
GFR: 22.06 mL/min — ABNORMAL LOW (ref >60.00)
Glucose, Bld: 81 mg/dL (ref 70–99)
Potassium: 4.7 mEq/L (ref 3.5–5.1)
Sodium: 141 mEq/L (ref 135–145)

## 2021-02-22 NOTE — Consult Note (Signed)
Chief Complaint: Patient was seen in consultation today for liver lesion at the request of Sherrill,Gary B  Referring Physician(s): Sherrill,Gary B  History of Present Illness: Cheryl Jordan is a 80 y.o. female with a history of NASH cirrhosis, chronic GI blood loss, and colon carcinoma 02/10/2015 right hemicolectomy, with 8 of 20 lymph nodes positive Patient underwent Xeloda chemotherapy. 02/06/2016 CT demonstrated hepatic cirrhosis, left renal mass 12/06/2016 liver MR demonstrated enlarging segment 8 lesion 02/06/2017 ultrasound-guided liver biopsy negative for neoplasm 04/29/2020 CT showed enlargement of a separate right liver lesion 12/13/2020 CT showed enlargement of right hepatic lesion, and a second posterior right subtle lesion 01/03/2021 liver MR demonstrated 2.6 x 2.9 cm segment 7 LiRADS 5 lesion, 1.1 cm segment 6 benign-appearing lesion  The patient  has also had recurrent symptomatic abdominal ascites requiring multiple paracentesis procedures; cytology has been negative for malignancy.  Past Medical History:  Diagnosis Date   Allergy    Anemia    hx of   Anxiety    Arthritis    Colon cancer ([place]) dx'd 11/2014   Diabetes mellitus without complication (HCC)    TYPE II   Fatty liver    Gallbladder disease    GERD (gastroesophageal reflux disease)    History of gout    Hypertension    Hypothyroidism    Kidney disorder    spot on left kidney   Osteoporosis    PONV (postoperative nausea and vomiting)     Past Surgical History:  Procedure Laterality Date   BIOPSY  06/17/2020   Procedure: BIOPSY;  Surgeon: Irving Copas., MD;  Location: [facility];  Service: Gastroenterology;;   CHOLECYSTECTOMY     COLONOSCOPY WITH PROPOFOL N/A 06/17/2020   Procedure: COLONOSCOPY WITH PROPOFOL;  Surgeon: Irving Copas., MD;  Location: [facility];  Service: Gastroenterology;  Laterality: N/A;   COLONOSCOPY WITH PROPOFOL N/A 07/01/2020   Procedure: COLONOSCOPY  WITH PROPOFOL;  Surgeon: Mauri Pole, MD;  Location: [facility];  Service: Endoscopy;  Laterality: N/A;   ESOPHAGOGASTRODUODENOSCOPY (EGD) WITH PROPOFOL N/A 06/17/2020   Procedure: ESOPHAGOGASTRODUODENOSCOPY (EGD) WITH PROPOFOL;  Surgeon: Rush Landmark Telford Nab., MD;  Location: [facility];  Service: Gastroenterology;  Laterality: N/A;   ESOPHAGOGASTRODUODENOSCOPY (EGD) WITH PROPOFOL N/A 07/01/2020   Procedure: ESOPHAGOGASTRODUODENOSCOPY (EGD) WITH PROPOFOL;  Surgeon: Mauri Pole, MD;  Location: [facility];  Service: Endoscopy;  Laterality: N/A;   FOREIGN BODY REMOVAL  06/17/2020   Procedure: FOREIGN BODY REMOVAL;  Surgeon: Rush Landmark Telford Nab., MD;  Location: [facility];  Service: Gastroenterology;;   GI RADIOFREQUENCY ABLATION  06/17/2020   Procedure: GI RADIOFREQUENCY ABLATION;  Surgeon: Irving Copas., MD;  Location: [facility];  Service: Gastroenterology;;   HEMOSTASIS CLIP PLACEMENT  06/17/2020   Procedure: HEMOSTASIS CLIP PLACEMENT;  Surgeon: Irving Copas., MD;  Location: [facility];  Service: Gastroenterology;;   HEMOSTASIS CLIP PLACEMENT  07/01/2020   Procedure: HEMOSTASIS CLIP PLACEMENT;  Surgeon: Mauri Pole, MD;  Location: [facility];  Service: Endoscopy;;   HOT HEMOSTASIS N/A 07/01/2020   Procedure: HOT HEMOSTASIS (ARGON PLASMA COAGULATION/BICAP);  Surgeon: Mauri Pole, MD;  Location: [facility];  Service: Endoscopy;  Laterality: N/A;   IR PARACENTESIS  08/19/2020   IR PARACENTESIS  09/26/2020   IR PARACENTESIS  11/03/2020   IR PARACENTESIS  11/28/2020   IR PARACENTESIS  12/21/2020   IR PARACENTESIS  01/17/2021   IR PARACENTESIS  02/08/2021   IR RADIOLOGIST EVAL & MGMT  01/24/2017  IR RADIOLOGIST EVAL & MGMT  02/22/2021   KNEE CARTILAGE SURGERY Bilateral    NEPHRECTOMY Left 02/10/2015   Procedure: OPEN RETROPERINTONEAL EXPLORATION LEFT RENAL  CYST DECORTICATION X 5;  Surgeon: Cleon Gustin, MD;  Location: [facility];   Service: Urology;  Laterality: Left;   PARTIAL COLECTOMY N/A 02/10/2015   Procedure: OPEN RIGHT  COLECTOMY ;  Surgeon: [name], MD;  Location: [facility];  Service: General;  Laterality: N/A;   POLYPECTOMY  06/17/2020   Procedure: POLYPECTOMY;  Surgeon: Rush Landmark Telford Nab., MD;  Location: [facility];  Service: Gastroenterology;;   TUBAL LIGATION      Allergies: Aspirin and Codeine  Medications: Prior to Admission medications   Medication Sig Start Date End Date Taking? Authorizing Provider  ACCU-CHEK AVIVA PLUS test strip  10/20/18   [provider]  acetaminophen (TYLENOL) 500 MG tablet Take 500 mg by mouth every 6 (six) hours as needed for moderate pain.    [provider]  amLODipine (NORVASC) 2.5 MG tablet Take 1 tablet (2.5 mg total) by mouth daily. 12/04/19   Brunetta Jeans, PA-C  Blood Glucose Monitoring Suppl (ACCU-CHEK AVIVA) device Use as instructed 07/05/20 07/05/21  Eugenie Filler, MD  ferrous sulfate 325 (65 FE) MG tablet Take 325 mg by mouth 2 (two) times daily with a meal.    [provider]  furosemide (LASIX) 40 MG tablet Take 1.5 tablets (60 mg total) by mouth daily. 09/21/20   [name], MD  glipiZIDE (GLUCOTROL) 5 MG tablet Take 5 mg by mouth daily before breakfast.    [provider]  levothyroxine (SYNTHROID) 88 MCG tablet Take 1 tablet (88 mcg total) by mouth daily. 12/04/19   Brunetta Jeans, PA-C  loperamide (IMODIUM) 2 MG capsule Take 2 mg by mouth as needed for diarrhea or loose stools.    [provider]  LORazepam (ATIVAN) 0.5 MG tablet Take 0.5 mg by mouth as needed for anxiety.    [provider]  omeprazole (PRILOSEC) 20 MG capsule Take 1 capsule (20 mg total) by mouth 2 (two) times daily before a meal. 08/10/20   Brunetta Jeans, PA-C  propranolol (INDERAL) 40 MG tablet 1 tablet 09/30/20   [provider]  Simethicone (GAS-X PO) Take 1 tablet by mouth as needed.    [provider]  sucralfate (CARAFATE) 1 g tablet Take 1 g by mouth 2 (two) times daily.    [provider]  telmisartan (MICARDIS) 40 MG tablet Take 0.5 tablets (20 mg total) by mouth in the morning and at bedtime. 07/12/20   Eugenie Filler, MD  rivaroxaban (XARELTO) 20 MG TABS tablet Take 1 tablet (20 mg total) by mouth daily with supper. Patient not taking: Reported on 08/04/2020 07/08/20 08/10/20  Eugenie Filler, MD     Family History  Problem Relation Age of Onset   Diabetes Mother    Hypertension Other    COPD Father    Hypertension Sister    GER disease Sister    Heart attack Brother    COPD Brother    Lung cancer Brother    COPD Brother    Hypertension Brother     Social History   Socioeconomic History   Marital status: Married    Spouse name: Not on file   Number of children: 2   Years of education: Not on file   Highest education level: Not on file  Occupational History   Occupation: Retired  Tobacco Use   Smoking status: Never   Smokeless tobacco: Never  Vaping Use   Vaping Use: Never used  Substance and Sexual Activity   Alcohol use: No   Drug use: No   Sexual activity: Never  Other Topics Concern   Not on file  Social History Narrative   03/03/15-Married, husband Jeneen Rinks   #2 grown sons and #2 grand daughters   Social Determinants of Health   Financial Resource Strain: Not on file  Food Insecurity: Not on file  Transportation Needs: Not on file  Physical Activity: Not on file  Stress: Not on file  Social Connections: Not on file    ECOG Status: 1 - Symptomatic but completely ambulatory  Review of Systems: A 12 point ROS discussed and pertinent positives are indicated in the HPI above.  All other systems are negative.  Review of Systems  Vital Signs: BP (!) 158/65 (BP Location: Right Arm)   Pulse 73   SpO2 93%   Physical Exam Constitutional: Oriented to person, place, and time. Well-developed and well-nourished. No distress.    HENT:  Head: Normocephalic and atraumatic.  Eyes: Conjunctivae and EOM are normal. Right eye exhibits no discharge. Left eye exhibits no discharge. No scleral icterus.  Neck: No JVD present.  Pulmonary/Chest: Effort normal. No stridor. No respiratory distress.  Abdomen: soft,   distended Neurological:  alert and oriented to person, place, and time.  Skin: Skin is warm and dry.  not diaphoretic.  Psychiatric:   normal mood and affect.   behavior is normal. Judgment and thought content normal.      Imaging: MRI ABDOMEN WITHOUT AND WITH CONTRAST   TECHNIQUE: Multiplanar multisequence MR imaging of the abdomen was performed both before and after the administration of intravenous contrast.   CONTRAST:  31mL MULTIHANCE GADOBENATE DIMEGLUMINE 529 MG/ML IV SOLN   COMPARISON:  CT abdomen pelvis, 12/13/2020   FINDINGS: Lower chest: Small left pleural effusion, unchanged.   Hepatobiliary: Coarse, nodular, cirrhotic morphology of the liver. In the lateral right lobe of the liver, hepatic segment VII, there is a 2.9 x 2.6 cm arterially hyperenhancing lesion with washout and capsular enhancement (series 11, image 34). A second subcapsular lesion of the lateral inferior right lower lobe, hepatic segment VI, demonstrates internal fluid signal without significant contrast enhancement appreciated. This measures no greater than 1.1 cm (series 18, image 44). Status post cholecystectomy. No biliary ductal dilatation.   Pancreas: No mass, inflammatory changes, or other parenchymal abnormality identified.   Spleen:  Splenomegaly, maximum coronal span 16.5 cm.   Adrenals/Urinary Tract: No masses identified. Multiple exophytic renal cysts bilaterally, larger and more numerous on the left. No evidence of hydronephrosis.   Stomach/Bowel: Visualized portions within the abdomen are unremarkable.   Vascular/Lymphatic: No pathologically enlarged lymph nodes identified. No abdominal aortic  aneurysm demonstrated.   Other:  None.   Musculoskeletal: No suspicious bone lesions identified.   IMPRESSION: 1. There is a 2.9 x 2.6 cm arterially hyperenhancing lesion with washout and capsular enhancement in the lateral right lobe of the liver, hepatic segment VII. Signal and enhancement characteristics are characteristic of hepatocellular carcinoma, hepatic metastatic disease from known colon malignancy distinctly not favored. LI-RADS category 5, consistent with hepatocellular carcinoma. 2. A second subcapsular lesion of the lateral inferior right lower lobe, hepatic segment VI, demonstrates internal fluid signal without significant contrast enhancement appreciated. This measures no greater than 1.1 cm and may reflect a small hemangioma or septated cyst. Attention on follow-up. 3. Cirrhosis. 4.  Splenomegaly. 5. Small left pleural effusion, unchanged.     Electronically Signed   By: Eddie Candle M.D.   On: 01/04/2021 14:09   Labs:  CBC: Recent Labs    12/13/20 0944 01/12/21 1053 01/26/21 1128 02/02/21 1053  WBC 4.2 4.4 4.9 6.0  HGB 9.2* 8.4* 9.1* 9.4*  HCT 30.1* 28.1* 30.5* 31.5*  PLT 114* 137* 125* 119*    COAGS: Recent Labs    05/25/20 1635 06/15/20 0923 06/30/20 1301 07/01/20 0032  INR 3.0* 1.3* 2.1* 1.6*    BMP: Recent Labs    07/05/20 0451 07/21/20 1133 08/23/20 1107 09/21/20 1543 10/13/20 1454 11/24/20 1419 12/13/20 0944 01/12/21 1053  NA 139   < > 141   < > 142 141 142 142  K 4.5   < > 5.0   < > 4.1 4.5 4.8 4.7  CL 109   < > 112*   < > 106 106 109 109  CO2 22   < > 25   < > 29 29 26 25   GLUCOSE 172*   < > 113*   < > 98 77 115* 97  BUN 18   < > 17   < > 20 25* 25* 24*  CALCIUM 8.5*   < > 8.6*   < > 8.9 8.9 8.5* 8.6*  CREATININE 1.51*   < > 1.67*   < > 1.52* 1.64* 1.54* 1.91*  GFRNONAA 35*  --  31*  --   --   --  34* 27*   < > = values in this interval not displayed.    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  Her liver function is currently relatively well-preserved. The lesion is anatomically approachable for percutaneous ablation.  It is safely distant from the liver hilum and adjacent organs and would probably respond well to CT-guided percutaneous microwave ablation.  She would probably require concomitant paracentesis to minimize bleeding risk at the capsule entry sites. I reviewed the imaging with the patient.  I reviewed the significance of the LiRADS 5 categorization in the setting of known cirrhosis.  I would not require percutaneous core biopsy preprocedure although if the patient needed pathologic confirmation before proceeding with treatment this would be a reasonable option.  Otherwise, I would attempt percutaneous core biopsy of the lesion at the time of ablation for definitive pathology.  We discussed the anticipated benefits, alternative treatment options, possible risks and side effects including bleeding, organ damage, hepatic dysfunction, nontarget ablation, pain, and incomplete lesion treatment or recurrence.  We discussed the need for long-term postprocedural surveillance.  She seemed to understand, and did ask appropriate questions which were answered.  She is motivated to proceed.  Accordingly, we can set her up for CT-guided microwave ablation of her liver lesion under general anesthesia at [facility], with plans for extended overnight observation, scheduled at her convenience. We will need to hold her Xarelto preprocedure  per  protocol.  Thank you for this interesting consult.  I greatly enjoyed meeting Cheryl Jordan and look forward to participating in their care.  A copy of this report was sent to the requesting provider on this date.  Electronically Signed: Rickard Rhymes 02/22/2021, 4:27 PM   I spent a total of  40 Minutes   in face to face in clinical consultation, greater than 50% of which was counseling/coordinating care for hepatocellular carcinoma.

## 2021-02-23 ENCOUNTER — Other Ambulatory Visit: Payer: Self-pay

## 2021-02-23 ENCOUNTER — Ambulatory Visit (HOSPITAL_COMMUNITY)
Admission: RE | Admit: 2021-02-23 | Discharge: 2021-02-23 | Disposition: A | Discharge status: Home / Self Care | Payer: Medicare Other | Source: Ambulatory Visit | Attending: Internal Medicine | Admitting: Internal Medicine

## 2021-02-23 ENCOUNTER — Other Ambulatory Visit (HOSPITAL_COMMUNITY): Payer: Self-pay | Admitting: Interventional Radiology

## 2021-02-23 DIAGNOSIS — K7460 Unspecified cirrhosis of liver: Secondary | ICD-10-CM

## 2021-02-23 DIAGNOSIS — R188 Other ascites: Secondary | ICD-10-CM | POA: Insufficient documentation

## 2021-02-23 DIAGNOSIS — C189 Malignant neoplasm of colon, unspecified: Secondary | ICD-10-CM

## 2021-02-23 HISTORY — PX: IR PARACENTESIS: IMG2679

## 2021-02-23 LAB — BODY FLUID CELL COUNT WITH DIFFERENTIAL
Eos, Fluid: 0 %
Lymphs, Fluid: 78 %
Monocyte-Macrophage-Serous Fluid: 16 % — ABNORMAL LOW (ref 50–90)
Neutrophil Count, Fluid: 6 % (ref 0–25)
Total Nucleated Cell Count, Fluid: 246 cu mm (ref 0–1000)

## 2021-02-23 MED ORDER — LIDOCAINE HCL 1 % IJ SOLN
INTRAMUSCULAR | Status: DC | PRN
  Administered 2021-02-23: 10 mL

## 2021-02-23 MED ORDER — LIDOCAINE HCL 1 % IJ SOLN
INTRAMUSCULAR | Status: AC
  Filled 2021-02-23: qty 20

## 2021-02-23 NOTE — Procedures (Signed)
PROCEDURE SUMMARY:  Successful US guided paracentesis from right abdomen.  Yielded 8 L of clear yellow fluid.  No immediate complications.  Pt tolerated well.   Specimen sent for labs.  EBL < 2 mL  Theresa Duty, NP 02/23/2021 10:58 AM

## 2021-02-24 ENCOUNTER — Other Ambulatory Visit: Payer: Self-pay | Admitting: Radiology

## 2021-02-24 NOTE — Progress Notes (Signed)
Sent message, via epic in basket, requesting orders in epic from surgeon.  

## 2021-02-25 LAB — PATHOLOGIST SMEAR REVIEW

## 2021-03-01 NOTE — Patient Instructions (Addendum)
DUE TO COVID-19 ONLY ONE VISITOR IS ALLOWED TO COME WITH YOU AND STAY IN THE WAITING ROOM ONLY DURING PRE OP AND PROCEDURE.   **NO VISITORS ARE ALLOWED IN THE SHORT STAY AREA OR RECOVERY ROOM!!**  IF YOU WILL BE ADMITTED INTO THE HOSPITAL YOU ARE ALLOWED ONLY TWO SUPPORT PEOPLE DURING VISITATION HOURS ONLY (10AM -8PM)   The support person(s) may change daily. The support person(s) must pass our screening, gel in and out, and wear a mask at all times, including in the patient's room. Patients must also wear a mask when staff or their support person are in the room.  No visitors under the age of 18. Any visitor under the age of 39 must be accompanied by an adult.    COVID SWAB TESTING MUST BE COMPLETED ON:  03/06/21 **MUST PRESENT COMPLETED FORM AT TESTING SITE**    [place] (backside of the building) No appointment needed. Open 8am-3pm. You are not required to quarantine, however you are required to wear a well-fitted mask when you are out and around people not in your household.  Hand Hygiene often Do NOT share personal items Notify your provider if you are in close contact with someone who has COVID or you develop fever 100.4 or greater, new onset of sneezing, cough, sore throat, shortness of breath or body aches.       Your procedure is scheduled on: 03/08/21   Report to [facility] Main  Entrance    Report to admitting at 6:30 AM   Call this number if you have problems the morning of surgery [phone]   Do not eat food :After Midnight.   May have liquids until 5:30 AM day of surgery  CLEAR LIQUID DIET  Foods Allowed                                                                     Foods Excluded  Water, Black Coffee and tea, regular and decaf               liquids that you cannot  Plain Jell-O in any flavor  (No red)                                   see through such as: Fruit ices (not with fruit pulp)                                            milk, soups, orange juice              Iced Popsicles (No red)                                              All solid food  Apple juices Sports drinks like Gatorade (No red) Lightly seasoned clear broth or consume(fat free) Sugar, honey syrup    Oral Hygiene is also important to reduce your risk of infection.                                    Remember - BRUSH YOUR TEETH THE MORNING OF SURGERY WITH YOUR REGULAR TOOTHPASTE   Take these medicines the morning of surgery with A SIP OF WATER: Tylenol, Amlodipine, Levothyroxine, Lorazepam, Omeprazole, Propranolol.  DO NOT TAKE ANY ORAL DIABETIC MEDICATIONS DAY OF YOUR SURGERY How to Manage Your Diabetes Before and After Surgery  Why is it important to control my blood sugar before and after surgery? Improving blood sugar levels before and after surgery helps healing and can limit problems. A way of improving blood sugar control is eating a healthy diet by:  Eating less sugar and carbohydrates  Increasing activity/exercise  Talking with your doctor about reaching your blood sugar goals High blood sugars (greater than 180 mg/dL) can raise your risk of infections and slow your recovery, so you will need to focus on controlling your diabetes during the weeks before surgery. Make sure that the doctor who takes care of your diabetes knows about your planned surgery including the date and location.  How do I manage my blood sugar before surgery? Check your blood sugar at least 4 times a day, starting 2 days before surgery, to make sure that the level is not too high or low. Check your blood sugar the morning of your surgery when you wake up and every 2 hours until you get to the Short Stay unit. If your blood sugar is less than 70 mg/dL, you will need to treat for low blood sugar: Do not take insulin. Treat a low blood sugar (less than 70 mg/dL) with  cup of clear juice (cranberry or apple), 4 glucose tablets,  OR glucose gel. Recheck blood sugar in 15 minutes after treatment (to make sure it is greater than 70 mg/dL). If your blood sugar is not greater than 70 mg/dL on recheck, call [phone] for further instructions. Report your blood sugar to the short stay nurse when you get to Short Stay.  If you are admitted to the hospital after surgery: Your blood sugar will be checked by the staff and you will probably be given insulin after surgery (instead of oral diabetes medicines) to make sure you have good blood sugar levels. The goal for blood sugar control after surgery is 80-180 mg/dL.   WHAT DO I DO ABOUT MY DIABETES MEDICATION?  Do not take oral diabetes medicines (pills) the morning of surgery.  THE DAY BEFORE SURGERY, take morning dose of glipizide.      THE MORNING OF SURGERY, do not take glipizide.  Reviewed and Endorsed by [facility] Patient Education Committee, August 2015                               You may not have any metal on your body including hair pins, jewelry, and body piercing             Do not wear make-up, lotions, powders, perfumes, or deodorant  Do not wear nail polish including gel and S&S, artificial/acrylic nails, or any other type of covering on natural nails including finger and toenails. If you have artificial nails,  gel coating, etc. that needs to be removed by a nail salon please have this removed prior to surgery or surgery may need to be canceled/ delayed if the surgeon/ anesthesia feels like they are unable to be safely monitored.   Do not shave  48 hours prior to surgery.    Do not bring valuables to the hospital. [place].   Contacts, dentures or bridgework may not be worn into surgery.   Bring small overnight bag day of surgery.   Please read over the following fact sheets you were given: IF YOU HAVE QUESTIONS ABOUT YOUR PRE OP INSTRUCTIONS PLEASE CALL [place] -  Preparing for Surgery Before surgery, you can play an important role.  Because skin is not sterile, your skin needs to be as free of germs as possible.  You can reduce the number of germs on your skin by washing with CHG (chlorahexidine gluconate) soap before surgery.  CHG is an antiseptic cleaner which kills germs and bonds with the skin to continue killing germs even after washing. Please DO NOT use if you have an allergy to CHG or antibacterial soaps.  If your skin becomes reddened/irritated stop using the CHG and inform your nurse when you arrive at Short Stay. Do not shave (including legs and underarms) for at least 48 hours prior to the first CHG shower.  You may shave your face/neck.  Please follow these instructions carefully:  1.  Shower with CHG Soap the night before surgery and the  morning of surgery.  2.  If you choose to wash your hair, wash your hair first as usual with your normal  shampoo.  3.  After you shampoo, rinse your hair and body thoroughly to remove the shampoo.                             4.  Use CHG as you would any other liquid soap.  You can apply chg directly to the skin and wash.  Gently with a scrungie or clean washcloth.  5.  Apply the CHG Soap to your body ONLY FROM THE NECK DOWN.   Do   not use on face/ open                           Wound or open sores. Avoid contact with eyes, ears mouth and   genitals (private parts).                       Wash face,  Genitals (private parts) with your normal soap.             6.  Wash thoroughly, paying special attention to the area where your    surgery  will be performed.  7.  Thoroughly rinse your body with warm water from the neck down.  8.  DO NOT shower/wash with your normal soap after using and rinsing off the CHG Soap.                9.  Pat yourself dry with a clean towel.            10.  Wear clean pajamas.            11.  Place clean sheets on your bed  the night of your first shower and do not  sleep with  pets. Day of Surgery : Do not apply any lotions/deodorants the morning of surgery.  Please wear clean clothes to the hospital/surgery center.  FAILURE TO FOLLOW THESE INSTRUCTIONS MAY RESULT IN THE CANCELLATION OF YOUR SURGERY  PATIENT SIGNATURE_________________________________  NURSE SIGNATURE__________________________________  ________________________________________________________________________

## 2021-03-01 NOTE — Progress Notes (Addendum)
COVID swab appointment: 03/06/21  COVID Vaccine Completed: yes x2 Date COVID Vaccine completed: 08/08/19, 09/05/19 Has received booster: COVID vaccine manufacturer:   Moderna     Date of COVID positive in last 90 days: No  PCP - Loletha Grayer, FNP Cardiologist - N/a  Chest x-ray -  EKG - 03/02/21 Epic Stress Test - N/a ECHO - 04/08/20 Epic Cardiac Cath - N/a Pacemaker/ICD device last checked: N/a Spinal Cord Stimulator: N/a  Sleep Study - N/a CPAP -   Fasting Blood Sugar - 100-110s Checks Blood Sugar __1__ times a day  Blood Thinner Instructions: N/a Aspirin Instructions: Last Dose:  Activity level: Can go up a flight of stairs and perform activities of daily living without stopping and without symptoms of chest pain. SOB with exertion, not new.       Anesthesia review: HTN, DM, DVT, liver cirrhosis, SOB, creatinine 2.49  Patient denies shortness of breath, fever, cough and chest pain at PAT appointment   Patient verbalized understanding of instructions that were given to them at the PAT appointment. Patient was also instructed that they will need to review over the PAT instructions again at home before surgery.

## 2021-03-02 ENCOUNTER — Other Ambulatory Visit: Payer: Self-pay

## 2021-03-02 ENCOUNTER — Encounter (HOSPITAL_COMMUNITY)
Admission: RE | Admit: 2021-03-02 | Discharge: 2021-03-02 | Disposition: A | Discharge status: Home / Self Care | Payer: Medicare Other | Source: Ambulatory Visit | Attending: Interventional Radiology | Admitting: Interventional Radiology

## 2021-03-02 ENCOUNTER — Other Ambulatory Visit (INDEPENDENT_AMBULATORY_CARE_PROVIDER_SITE_OTHER): Payer: Medicare Other

## 2021-03-02 ENCOUNTER — Encounter (HOSPITAL_COMMUNITY): Payer: Self-pay

## 2021-03-02 ENCOUNTER — Ambulatory Visit (HOSPITAL_COMMUNITY)
Admission: RE | Admit: 2021-03-02 | Discharge: 2021-03-02 | Disposition: A | Discharge status: Home / Self Care | Payer: Medicare Other | Source: Ambulatory Visit | Attending: Radiology | Admitting: Radiology

## 2021-03-02 DIAGNOSIS — Z01818 Encounter for other preprocedural examination: Secondary | ICD-10-CM

## 2021-03-02 DIAGNOSIS — I10 Essential (primary) hypertension: Secondary | ICD-10-CM | POA: Diagnosis not present

## 2021-03-02 DIAGNOSIS — Z79899 Other long term (current) drug therapy: Secondary | ICD-10-CM | POA: Diagnosis not present

## 2021-03-02 DIAGNOSIS — E119 Type 2 diabetes mellitus without complications: Secondary | ICD-10-CM | POA: Insufficient documentation

## 2021-03-02 DIAGNOSIS — K7460 Unspecified cirrhosis of liver: Secondary | ICD-10-CM

## 2021-03-02 DIAGNOSIS — C189 Malignant neoplasm of colon, unspecified: Secondary | ICD-10-CM | POA: Insufficient documentation

## 2021-03-02 DIAGNOSIS — Z7984 Long term (current) use of oral hypoglycemic drugs: Secondary | ICD-10-CM | POA: Insufficient documentation

## 2021-03-02 DIAGNOSIS — R188 Other ascites: Secondary | ICD-10-CM

## 2021-03-02 DIAGNOSIS — K7581 Nonalcoholic steatohepatitis (NASH): Secondary | ICD-10-CM | POA: Insufficient documentation

## 2021-03-02 DIAGNOSIS — C787 Secondary malignant neoplasm of liver and intrahepatic bile duct: Secondary | ICD-10-CM | POA: Insufficient documentation

## 2021-03-02 HISTORY — DX: Dyspnea, unspecified: R06.00

## 2021-03-02 HISTORY — DX: Nonrheumatic aortic (valve) stenosis: I35.0

## 2021-03-02 HISTORY — DX: Cardiac murmur, unspecified: R01.1

## 2021-03-02 LAB — CBC WITH DIFFERENTIAL/PLATELET
Abs Immature Granulocytes: 0.01 10*3/uL (ref 0.00–0.07)
Basophils Absolute: 0 10*3/uL (ref 0.0–0.1)
Basophils Relative: 1 %
Eosinophils Absolute: 0.1 10*3/uL (ref 0.0–0.5)
Eosinophils Relative: 1 %
HCT: 35.6 % — ABNORMAL LOW (ref 36.0–46.0)
Hemoglobin: 10.6 g/dL — ABNORMAL LOW (ref 12.0–15.0)
Immature Granulocytes: 0 %
Lymphocytes Relative: 29 %
Lymphs Abs: 1.6 10*3/uL (ref 0.7–4.0)
MCH: 27.6 pg (ref 26.0–34.0)
MCHC: 29.8 g/dL — ABNORMAL LOW (ref 30.0–36.0)
MCV: 92.7 fL (ref 80.0–100.0)
Monocytes Absolute: 0.4 10*3/uL (ref 0.1–1.0)
Monocytes Relative: 7 %
Neutro Abs: 3.5 10*3/uL (ref 1.7–7.7)
Neutrophils Relative %: 62 %
Platelets: 142 10*3/uL — ABNORMAL LOW (ref 150–400)
RBC: 3.84 MIL/uL — ABNORMAL LOW (ref 3.87–5.11)
RDW: 17.3 % — ABNORMAL HIGH (ref 11.5–15.5)
WBC: 5.7 10*3/uL (ref 4.0–10.5)
nRBC: 0 % (ref 0.0–0.2)

## 2021-03-02 LAB — COMPREHENSIVE METABOLIC PANEL
ALT: 13 U/L (ref 0–44)
AST: 25 U/L (ref 15–41)
Albumin: 2.7 g/dL — ABNORMAL LOW (ref 3.5–5.0)
Alkaline Phosphatase: 113 U/L (ref 38–126)
Anion gap: 6 (ref 5–15)
BUN: 41 mg/dL — ABNORMAL HIGH (ref 8–23)
CO2: 24 mmol/L (ref 22–32)
Calcium: 8.8 mg/dL — ABNORMAL LOW (ref 8.9–10.3)
Chloride: 112 mmol/L — ABNORMAL HIGH (ref 98–111)
Creatinine, Ser: 2.49 mg/dL — ABNORMAL HIGH (ref 0.44–1.00)
GFR, Estimated: 19 mL/min — ABNORMAL LOW (ref >60)
Glucose, Bld: 98 mg/dL (ref 70–99)
Potassium: 5.5 mmol/L — ABNORMAL HIGH (ref 3.5–5.1)
Sodium: 142 mmol/L (ref 135–145)
Total Bilirubin: 0.7 mg/dL (ref 0.3–1.2)
Total Protein: 5.9 g/dL — ABNORMAL LOW (ref 6.5–8.1)

## 2021-03-02 LAB — BASIC METABOLIC PANEL
BUN: 40 mg/dL — ABNORMAL HIGH (ref 6–23)
CO2: 25 mEq/L (ref 19–32)
Calcium: 8.6 mg/dL (ref 8.4–10.5)
Chloride: 107 mEq/L (ref 96–112)
Creatinine, Ser: 2.46 mg/dL — ABNORMAL HIGH (ref 0.40–1.20)
GFR: 18.24 mL/min — ABNORMAL LOW (ref >60.00)
Glucose, Bld: 90 mg/dL (ref 70–99)
Potassium: 4.8 mEq/L (ref 3.5–5.1)
Sodium: 139 mEq/L (ref 135–145)

## 2021-03-02 LAB — GLUCOSE, CAPILLARY: Glucose-Capillary: 105 mg/dL — ABNORMAL HIGH (ref 70–99)

## 2021-03-02 LAB — HEMOGLOBIN A1C
Hgb A1c MFr Bld: 5.1 % (ref 4.8–5.6)
Mean Plasma Glucose: 99.67 mg/dL

## 2021-03-02 LAB — PROTIME-INR
INR: 1.1 (ref 0.8–1.2)
Prothrombin Time: 14.7 seconds (ref 11.4–15.2)

## 2021-03-03 ENCOUNTER — Encounter (HOSPITAL_COMMUNITY): Payer: Self-pay

## 2021-03-03 NOTE — Progress Notes (Signed)
Creatinine came back 2.49. Results routed to Dr. Vernard Gambles.

## 2021-03-03 NOTE — Progress Notes (Signed)
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 Cr was 1.54 on 12/13/20 and has steadily climbed over last 2 months. Baseline Cr appears to be ~1.5 - K is 5.5  Reviewed labs with Dr. Daiva Huge. Will recheck BMP day of surgery   VS: BP 126/63   Pulse 70   Temp 36.4 C (Oral)   Resp 20   Ht 5\' 7"  (1.702 m)   Wt 92.4 kg   SpO2 98%   BMI 31.92 kg/m   PROVIDERS: - PCP is Wannetta Sender, FNP - Oncologist is [name], MD - GI is Scarlette Shorts, MD - Cardiologist is Minus Breeding, MD. Initial office visit 03/30/20  LABS:  Cr 2.49, BUN 41. Cr was 1.54 on 12/13/20 and has steadily climbed over last 2 months. Baseline Cr appears to be ~1.5  (all labs ordered are listed, but only abnormal results are displayed)  Labs Reviewed  CBC WITH DIFFERENTIAL/PLATELET - Abnormal; Notable for the following components:      Result Value   RBC 3.84 (*)    Hemoglobin 10.6 (*)    HCT 35.6 (*)    MCHC 29.8 (*)    RDW 17.3 (*)    Platelets 142 (*)    All other components within normal limits  COMPREHENSIVE METABOLIC PANEL - Abnormal; Notable for the following components:   Potassium 5.5 (*)    Chloride 112 (*)    BUN 41 (*)    Creatinine, Ser 2.49 (*)    Calcium 8.8 (*)    Total Protein 5.9 (*)    Albumin 2.7 (*)    GFR, Estimated 19 (*)    All other components within normal limits  GLUCOSE, CAPILLARY - Abnormal; Notable for the following components:   Glucose-Capillary 105 (*)    All other components within normal limits  PROTIME-INR  HEMOGLOBIN A1C     IMAGES: CXR  03/02/21:  - Hypoinflation of the lungs with mild bibasilar subsegmental atelectasis   EKG 03/02/21: NSR. Inferior infarct , age undetermined. Appears stable compared to prior tracings   CV: Echo 04/08/20:  1. Left ventricular ejection fraction, by estimation, is 60 to 65%. The left ventricle has normal function. The left ventricle has no regional Canino motion abnormalities. There is moderate left ventricular hypertrophy. Left ventricular diastolic  parameters are consistent with Grade I diastolic dysfunction (impaired relaxation).   2. Right ventricular systolic function is normal. The right ventricular size is normal. Tricuspid regurgitation signal is inadequate for assessing PA pressure.   3. Left atrial size was mildly dilated.   4. The mitral valve is normal in structure. Mild mitral valve  regurgitation. No evidence of mitral stenosis.   5. The aortic valve is tricuspid. Aortic valve regurgitation is not visualized. Mild aortic valve stenosis. Aortic valve area, by VTI measures 2.11 cm. Aortic valve mean gradient measures 11.0 mmHg.   6. The inferior vena cava is normal in size with greater than 50% respiratory variability, suggesting right atrial pressure of 3 mmHg.     Past Medical History:  Diagnosis Date   Allergy    Anemia    hx of   Anxiety    Aortic stenosis    mild by 04/08/20 echo   Arthritis    Colon cancer ([place]) dx'd 11/2014   Diabetes mellitus without complication (HCC)    TYPE II   Dyspnea    Fatty liver    Gallbladder disease    GERD (gastroesophageal reflux disease)    Heart murmur    History of gout    Hypertension    Hypothyroidism    Kidney disorder    spot on left kidney   Osteoporosis    PONV (postoperative nausea and vomiting)     Past Surgical History:  Procedure Laterality Date   BIOPSY  06/17/2020   Procedure: BIOPSY;  Surgeon: Irving Copas., MD;  Location: [facility];  Service: Gastroenterology;;   CATARACT EXTRACTION Left     CHOLECYSTECTOMY     COLONOSCOPY WITH PROPOFOL N/A 06/17/2020   Procedure: COLONOSCOPY WITH PROPOFOL;  Surgeon: Irving Copas., MD;  Location: [facility];  Service: Gastroenterology;  Laterality: N/A;   COLONOSCOPY WITH PROPOFOL N/A 07/01/2020   Procedure: COLONOSCOPY WITH PROPOFOL;  Surgeon: Mauri Pole, MD;  [facility];  Service: Endoscopy;  Laterality: N/A;   ESOPHAGOGASTRODUODENOSCOPY (EGD) WITH PROPOFOL N/A 06/17/2020   Procedure: ESOPHAGOGASTRODUODENOSCOPY (EGD) WITH PROPOFOL;  Surgeon: Rush Landmark Telford Nab., MD;  [facility];  Service: Gastroenterology;  Laterality: N/A;   ESOPHAGOGASTRODUODENOSCOPY (EGD) WITH PROPOFOL N/A 07/01/2020   Procedure: ESOPHAGOGASTRODUODENOSCOPY (EGD) WITH PROPOFOL;  Surgeon: Mauri Pole, MD;  [facility];  Service: Endoscopy;  Laterality: N/A;   FOREIGN BODY REMOVAL  06/17/2020   Procedure: FOREIGN BODY REMOVAL;  Surgeon: Rush Landmark Telford Nab., MD;  Location: [facility];  Service: Gastroenterology;;   GI RADIOFREQUENCY ABLATION  06/17/2020   Procedure: GI RADIOFREQUENCY ABLATION;  Surgeon: Irving Copas., MD;  Location: [facility];  Service: Gastroenterology;;   HEMOSTASIS CLIP PLACEMENT  06/17/2020   Procedure: HEMOSTASIS CLIP PLACEMENT;  Surgeon: Irving Copas., MD;  Location: [facility];  Service: Gastroenterology;;   HEMOSTASIS CLIP PLACEMENT  07/01/2020   Procedure: HEMOSTASIS CLIP PLACEMENT;  Surgeon: Mauri Pole, MD;  [facility];  Service: Endoscopy;;   HOT HEMOSTASIS N/A 07/01/2020   Procedure: HOT HEMOSTASIS (ARGON PLASMA COAGULATION/BICAP);  Surgeon: Mauri Pole, MD;  Location: [facility];  Service: Endoscopy;  Laterality: N/A;   IR PARACENTESIS  08/19/2020   IR PARACENTESIS  09/26/2020   IR PARACENTESIS  11/03/2020   IR PARACENTESIS  11/28/2020   IR PARACENTESIS  12/21/2020   IR PARACENTESIS  01/17/2021   IR PARACENTESIS   02/08/2021   IR PARACENTESIS  02/23/2021   IR RADIOLOGIST EVAL & MGMT  01/24/2017   IR RADIOLOGIST EVAL & MGMT  02/22/2021   KNEE CARTILAGE SURGERY Bilateral    NEPHRECTOMY Left 02/10/2015   Procedure: OPEN RETROPERINTONEAL EXPLORATION LEFT RENAL  CYST DECORTICATION X 5;  Surgeon: Cleon Gustin, MD;  [facility];  Service: Urology;  Laterality: Left;   PARTIAL COLECTOMY N/A 02/10/2015   Procedure: OPEN RIGHT  COLECTOMY ;  Surgeon: [name], MD;  [facility];  Service: General;  Laterality: N/A;   POLYPECTOMY  06/17/2020   Procedure: POLYPECTOMY;  Surgeon: Irving Copas., MD;  Location: [facility];  Service: Gastroenterology;;   TUBAL LIGATION      MEDICATIONS:  ACCU-CHEK AVIVA PLUS test strip   acetaminophen (TYLENOL) 500 MG tablet   amLODipine (NORVASC) 2.5  MG tablet   Blood Glucose Monitoring Suppl (ACCU-CHEK AVIVA) device   ferrous sulfate 325 (65 FE) MG tablet   furosemide (LASIX) 40 MG tablet   glipiZIDE (GLUCOTROL XL) 5 MG 24 hr tablet   glipiZIDE (GLUCOTROL) 5 MG tablet   levothyroxine (SYNTHROID) 88 MCG tablet   loperamide (IMODIUM) 2 MG capsule   LORazepam (ATIVAN) 0.5 MG tablet   omeprazole (PRILOSEC) 20 MG capsule   propranolol (INDERAL) 40 MG tablet   Simethicone (GAS-X PO)   telmisartan (MICARDIS) 40 MG tablet    albumin human 25 % solution 12.5 g    If labs acceptable day of surgery, I anticipate pt can proceed with surgery as scheduled.  Willeen Cass, PhD, FNP-BC [facility]/Anesthesiology Phone: [phone] 03/03/2021 10:03 AM

## 2021-03-03 NOTE — Anesthesia Preprocedure Evaluation (Addendum)
Anesthesia Evaluation  Patient identified by MRN, date of birth, ID band Patient awake    Reviewed: Allergy & Precautions, NPO status , Patient's Chart, lab work & pertinent test results  History of Anesthesia Complications (+) PONV and history of anesthetic complications  Airway Mallampati: II  TM Distance: >3 FB Neck ROM: Full    Dental  (+) Poor Dentition, Loose, Missing,    Pulmonary neg pulmonary ROS,    Pulmonary exam normal        Cardiovascular hypertension, Pt. on medications Normal cardiovascular exam  Echo 04/08/20: EF 60-65%, moderate LVH, grade I DD, mild LAE, mild MR, mild AS   Neuro/Psych Anxiety negative neurological ROS     GI/Hepatic PUD, GERD  Medicated and Controlled,(+) Cirrhosis   ascites    , Colon ca   Endo/Other  diabetes, Type 2, Oral Hypoglycemic AgentsHypothyroidism   Renal/GU Renal InsufficiencyRenal disease (Cr 2.46)  negative genitourinary   Musculoskeletal  (+) Arthritis ,   Abdominal   Peds  Hematology  (+) anemia , Hgb 10.6, plts 142k   Anesthesia Other Findings Day of surgery medications reviewed with patient.  Reproductive/Obstetrics negative OB ROS                           Anesthesia Physical Anesthesia Plan  ASA: 4  Anesthesia Plan: General   Post-op Pain Management:    Induction: Intravenous  PONV Risk Score and Plan: 4 or greater and Treatment may vary due to age or medical condition, Ondansetron and Dexamethasone  Airway Management Planned: Oral ETT  Additional Equipment: None  Intra-op Plan:   Post-operative Plan: Extubation in OR  Informed Consent: I have reviewed the patients History and Physical, chart, labs and discussed the procedure including the risks, benefits and alternatives for the proposed anesthesia with the patient or authorized representative who has indicated his/her understanding and acceptance.     Dental  advisory given  Plan Discussed with: CRNA  Anesthesia Plan Comments: (See APP note by Durel Salts, FNP )      Anesthesia Quick Evaluation

## 2021-03-06 ENCOUNTER — Other Ambulatory Visit (HOSPITAL_COMMUNITY): Payer: Self-pay | Admitting: Interventional Radiology

## 2021-03-07 ENCOUNTER — Inpatient Hospital Stay: Payer: Medicare Other | Admitting: Nurse Practitioner

## 2021-03-07 ENCOUNTER — Other Ambulatory Visit: Payer: Self-pay | Admitting: Radiology

## 2021-03-07 ENCOUNTER — Inpatient Hospital Stay: Payer: Medicare Other

## 2021-03-07 LAB — SARS CORONAVIRUS 2 (TAT 6-24 HRS): SARS Coronavirus 2: NEGATIVE

## 2021-03-08 ENCOUNTER — Ambulatory Visit (HOSPITAL_COMMUNITY)
Admission: RE | Admit: 2021-03-08 | Discharge: 2021-03-08 | Disposition: A | Discharge status: Home / Self Care | Payer: Medicare Other | Source: Ambulatory Visit | Attending: Interventional Radiology | Admitting: Interventional Radiology

## 2021-03-08 ENCOUNTER — Ambulatory Visit (HOSPITAL_COMMUNITY): Payer: Medicare Other | Admitting: Emergency Medicine

## 2021-03-08 ENCOUNTER — Ambulatory Visit (HOSPITAL_COMMUNITY)
Admission: RE | Admit: 2021-03-08 | Discharge: 2021-03-09 | Disposition: A | Discharge status: Home / Self Care | Payer: Medicare Other | Attending: Interventional Radiology | Admitting: Interventional Radiology

## 2021-03-08 ENCOUNTER — Encounter (HOSPITAL_COMMUNITY): Payer: Self-pay | Admitting: Interventional Radiology

## 2021-03-08 ENCOUNTER — Other Ambulatory Visit: Payer: Self-pay

## 2021-03-08 ENCOUNTER — Encounter (HOSPITAL_COMMUNITY)
Admission: RE | Disposition: A | Discharge status: Home / Self Care | Payer: Self-pay | Source: Home / Self Care | Attending: Interventional Radiology

## 2021-03-08 DIAGNOSIS — Z885 Allergy status to narcotic agent status: Secondary | ICD-10-CM | POA: Insufficient documentation

## 2021-03-08 DIAGNOSIS — C220 Liver cell carcinoma: Secondary | ICD-10-CM | POA: Insufficient documentation

## 2021-03-08 DIAGNOSIS — C189 Malignant neoplasm of colon, unspecified: Secondary | ICD-10-CM

## 2021-03-08 DIAGNOSIS — Z886 Allergy status to analgesic agent status: Secondary | ICD-10-CM | POA: Insufficient documentation

## 2021-03-08 DIAGNOSIS — Z7984 Long term (current) use of oral hypoglycemic drugs: Secondary | ICD-10-CM | POA: Diagnosis not present

## 2021-03-08 DIAGNOSIS — E119 Type 2 diabetes mellitus without complications: Secondary | ICD-10-CM | POA: Diagnosis not present

## 2021-03-08 DIAGNOSIS — Z7901 Long term (current) use of anticoagulants: Secondary | ICD-10-CM | POA: Insufficient documentation

## 2021-03-08 DIAGNOSIS — Z86718 Personal history of other venous thrombosis and embolism: Secondary | ICD-10-CM | POA: Diagnosis not present

## 2021-03-08 DIAGNOSIS — Z85038 Personal history of other malignant neoplasm of large intestine: Secondary | ICD-10-CM | POA: Insufficient documentation

## 2021-03-08 DIAGNOSIS — Z79890 Hormone replacement therapy (postmenopausal): Secondary | ICD-10-CM | POA: Diagnosis not present

## 2021-03-08 DIAGNOSIS — I10 Essential (primary) hypertension: Secondary | ICD-10-CM | POA: Diagnosis not present

## 2021-03-08 DIAGNOSIS — E039 Hypothyroidism, unspecified: Secondary | ICD-10-CM | POA: Diagnosis not present

## 2021-03-08 DIAGNOSIS — R160 Hepatomegaly, not elsewhere classified: Secondary | ICD-10-CM

## 2021-03-08 DIAGNOSIS — K7460 Unspecified cirrhosis of liver: Secondary | ICD-10-CM | POA: Insufficient documentation

## 2021-03-08 DIAGNOSIS — Z79899 Other long term (current) drug therapy: Secondary | ICD-10-CM | POA: Insufficient documentation

## 2021-03-08 HISTORY — PX: RADIOLOGY WITH ANESTHESIA: SHX6223

## 2021-03-08 LAB — BASIC METABOLIC PANEL
Anion gap: 9 (ref 5–15)
BUN: 41 mg/dL — ABNORMAL HIGH (ref 8–23)
CO2: 23 mmol/L (ref 22–32)
Calcium: 8.8 mg/dL — ABNORMAL LOW (ref 8.9–10.3)
Chloride: 109 mmol/L (ref 98–111)
Creatinine, Ser: 2.72 mg/dL — ABNORMAL HIGH (ref 0.44–1.00)
GFR, Estimated: 17 mL/min — ABNORMAL LOW (ref >60)
Glucose, Bld: 89 mg/dL (ref 70–99)
Potassium: 5.1 mmol/L (ref 3.5–5.1)
Sodium: 141 mmol/L (ref 135–145)

## 2021-03-08 LAB — GLUCOSE, CAPILLARY
Glucose-Capillary: 92 mg/dL (ref 70–99)
Glucose-Capillary: 109 mg/dL — ABNORMAL HIGH (ref 70–99)

## 2021-03-08 SURGERY — CT WITH ANESTHESIA
Anesthesia: General

## 2021-03-08 MED ORDER — DEXAMETHASONE SODIUM PHOSPHATE 4 MG/ML IJ SOLN
INTRAMUSCULAR | Status: DC | PRN
  Administered 2021-03-08: 4 mg via INTRAVENOUS

## 2021-03-08 MED ORDER — AMISULPRIDE (ANTIEMETIC) 5 MG/2ML IV SOLN
10.0000 mg | Freq: Once | INTRAVENOUS | Status: AC
  Administered 2021-03-08: 10 mg via INTRAVENOUS

## 2021-03-08 MED ORDER — ALBUMIN HUMAN 25 % IV SOLN
25.0000 g | Freq: Once | INTRAVENOUS | Status: AC
  Administered 2021-03-08: 25 g via INTRAVENOUS
  Filled 2021-03-08: qty 100

## 2021-03-08 MED ORDER — ALBUMIN HUMAN 25 % IV SOLN
INTRAVENOUS | Status: AC
  Filled 2021-03-08: qty 100

## 2021-03-08 MED ORDER — PHENYLEPHRINE HCL-NACL 20-0.9 MG/250ML-% IV SOLN
INTRAVENOUS | Status: DC | PRN
  Administered 2021-03-08: 50 ug/min via INTRAVENOUS

## 2021-03-08 MED ORDER — LIDOCAINE 2% (20 MG/ML) 5 ML SYRINGE
INTRAMUSCULAR | Status: DC | PRN
  Administered 2021-03-08: 100 mg via INTRAVENOUS

## 2021-03-08 MED ORDER — PNEUMOCOCCAL VAC POLYVALENT 25 MCG/0.5ML IJ INJ
0.5000 mL | INJECTION | INTRAMUSCULAR | Status: DC
  Filled 2021-03-08: qty 0.5

## 2021-03-08 MED ORDER — ALBUMIN HUMAN 5 % IV SOLN
INTRAVENOUS | Status: AC
  Filled 2021-03-08: qty 500

## 2021-03-08 MED ORDER — FENTANYL CITRATE (PF) 100 MCG/2ML IJ SOLN: 25.0000 ug | INTRAMUSCULAR | Status: DC | PRN

## 2021-03-08 MED ORDER — SODIUM CHLORIDE 0.9 % IV SOLN: 2.0000 g | Freq: Once | INTRAVENOUS | Status: DC

## 2021-03-08 MED ORDER — ROCURONIUM BROMIDE 10 MG/ML (PF) SYRINGE
PREFILLED_SYRINGE | INTRAVENOUS | Status: DC | PRN
  Administered 2021-03-08: 70 mg via INTRAVENOUS

## 2021-03-08 MED ORDER — SENNOSIDES-DOCUSATE SODIUM 8.6-50 MG PO TABS: 1.0000 | ORAL_TABLET | Freq: Every day | ORAL | Status: DC | PRN

## 2021-03-08 MED ORDER — CHLORHEXIDINE GLUCONATE 0.12 % MT SOLN
15.0000 mL | Freq: Once | OROMUCOSAL | Status: AC
  Administered 2021-03-08: 15 mL via OROMUCOSAL

## 2021-03-08 MED ORDER — FENTANYL CITRATE (PF) 100 MCG/2ML IJ SOLN
INTRAMUSCULAR | Status: DC | PRN
  Administered 2021-03-08: 100 ug via INTRAVENOUS

## 2021-03-08 MED ORDER — AMISULPRIDE (ANTIEMETIC) 5 MG/2ML IV SOLN
INTRAVENOUS | Status: AC
  Filled 2021-03-08: qty 4

## 2021-03-08 MED ORDER — SUGAMMADEX SODIUM 200 MG/2ML IV SOLN
INTRAVENOUS | Status: DC | PRN
  Administered 2021-03-08: 200 mg via INTRAVENOUS

## 2021-03-08 MED ORDER — LACTATED RINGERS IV SOLN: INTRAVENOUS | Status: DC

## 2021-03-08 MED ORDER — VASOPRESSIN 20 UNIT/ML IV SOLN
INTRAVENOUS | Status: AC
  Filled 2021-03-08: qty 1

## 2021-03-08 MED ORDER — PROPOFOL 10 MG/ML IV BOLUS
INTRAVENOUS | Status: DC | PRN
  Administered 2021-03-08: 50 mg via INTRAVENOUS
  Administered 2021-03-08: 120 mg via INTRAVENOUS

## 2021-03-08 MED ORDER — FENTANYL CITRATE (PF) 100 MCG/2ML IJ SOLN
INTRAMUSCULAR | Status: AC
  Administered 2021-03-08: 25 ug via INTRAVENOUS
  Filled 2021-03-08: qty 2

## 2021-03-08 MED ORDER — ORAL CARE MOUTH RINSE: 15.0000 mL | Freq: Once | OROMUCOSAL | Status: AC

## 2021-03-08 MED ORDER — SODIUM CHLORIDE 0.9 % IV SOLN
2.0000 g | Freq: Once | INTRAVENOUS | Status: DC
  Filled 2021-03-08: qty 2

## 2021-03-08 MED ORDER — DOCUSATE SODIUM 100 MG PO CAPS
100.0000 mg | ORAL_CAPSULE | Freq: Two times a day (BID) | ORAL | Status: DC
  Filled 2021-03-08 (×2): qty 1

## 2021-03-08 MED ORDER — FENTANYL CITRATE (PF) 100 MCG/2ML IJ SOLN
INTRAMUSCULAR | Status: AC
  Filled 2021-03-08: qty 4

## 2021-03-08 MED ORDER — ONDANSETRON HCL 4 MG/2ML IJ SOLN
4.0000 mg | Freq: Four times a day (QID) | INTRAMUSCULAR | Status: DC | PRN
  Administered 2021-03-08: 4 mg via INTRAVENOUS
  Filled 2021-03-08: qty 2

## 2021-03-08 MED ORDER — PHENYLEPHRINE HCL-NACL 20-0.9 MG/250ML-% IV SOLN
INTRAVENOUS | Status: AC
  Filled 2021-03-08: qty 250

## 2021-03-08 MED ORDER — EPHEDRINE SULFATE-NACL 50-0.9 MG/10ML-% IV SOSY
PREFILLED_SYRINGE | INTRAVENOUS | Status: DC | PRN
  Administered 2021-03-08 (×2): 5 mg via INTRAVENOUS

## 2021-03-08 MED ORDER — PHENYLEPHRINE 40 MCG/ML (10ML) SYRINGE FOR IV PUSH (FOR BLOOD PRESSURE SUPPORT)
PREFILLED_SYRINGE | INTRAVENOUS | Status: DC | PRN
  Administered 2021-03-08: 80 ug via INTRAVENOUS
  Administered 2021-03-08 (×2): 120 ug via INTRAVENOUS
  Administered 2021-03-08: 80 ug via INTRAVENOUS

## 2021-03-08 NOTE — Progress Notes (Signed)
Patient ID: Cheryl Jordan, female   DOB: [birth year], 80 y.o.   MRN: [id number] Patient status post CT-guided microwave ablation/biopsy of right hepatic lobe mass and paracentesis earlier today;  resting quietly in bed, son in room, has had a small amount of liquids but no food yet; reports only mild right upper/ lateral abdominal/flank discomfort.  Denies nausea /vomiting or  respiratory issues; BP 134/55, afebrile, puncture sites right upper/lateral abdominal region and right lower quadrant region clean and dry, some minimal ecchymosis noted; mildly tender right upper/lateral abdominal site but soft to touch; for overnight observation; check final pathology; follow-up with Dr. Vernard Gambles in IR clinic in 4 weeks.

## 2021-03-08 NOTE — Plan of Care (Signed)

## 2021-03-08 NOTE — H&P (Addendum)
Referring Physician(s): Sherrill,B  Supervising Physician: Arne Cleveland  Patient Status:  [facility] TBA  Chief Complaint:  Liver mass concerning for hepatocellular carcinoma  Subjective: Patient known to IR service  from right lobe liver mass biopsy in 2018 as well as multiple paracenteses with last being on 8/11 yielding 8 L.  She also underwent consultation with Dr. Vernard Gambles on 02/22/2021 to discuss treatment options for liver tumor concerning for hepatocellular carcinoma.  She has a past medical history significant for cirrhosis, RLE DVT(post tibial)- on xarelto,anemia, renal insufficiency, anxiety, aortic stenosis, arthritis, colon cancer in 2016, diabetes, GERD, gout, hypertension, hypothyroidism, and osteoporosis.  MRI of the liver on 01/03/2021 revealed:  1. There is a 2.9 x 2.6 cm arterially hyperenhancing lesion with washout and capsular enhancement in the lateral right lobe of the liver, hepatic segment VII. Signal and enhancement characteristics are characteristic of hepatocellular carcinoma, hepatic metastatic disease from known colon malignancy distinctly not favored. LI-RADS category 5, consistent with hepatocellular carcinoma. 2. A second subcapsular lesion of the lateral inferior right lower lobe, hepatic segment VI, demonstrates internal fluid signal without significant contrast enhancement appreciated. This measures no greater than 1.1 cm and may reflect a small hemangioma or septated cyst. Attention on follow-up. 3. Cirrhosis. 4. Splenomegaly. 5. Small left pleural effusion, unchanged.  Following discussions with Dr. Vernard Gambles she was deemed an appropriate candidate for image guided microwave ablation of the segment 7 liver tumor along with possible biopsy as well as paracentesis.  She presents today for the procedure.  She currently denies fever, headache, chest pain, dyspnea, cough, back pain, nausea, vomiting or bleeding.  She does have some abdominal  fullness/distention.  Past Medical History:  Diagnosis Date   Allergy    Anemia    hx of   Anxiety    Aortic stenosis    mild by 04/08/20 echo   Arthritis    Colon cancer ([place]) dx'd 11/2014   Diabetes mellitus without complication (HCC)    TYPE II   Dyspnea    Fatty liver    Gallbladder disease    GERD (gastroesophageal reflux disease)    Heart murmur    History of gout    Hypertension    Hypothyroidism    Kidney disorder    spot on left kidney   Osteoporosis    PONV (postoperative nausea and vomiting)    Past Surgical History:  Procedure Laterality Date   BIOPSY  06/17/2020   Procedure: BIOPSY;  Surgeon: Irving Copas., MD;  Location: [facility];  Service: Gastroenterology;;   CATARACT EXTRACTION Left    CHOLECYSTECTOMY     COLONOSCOPY WITH PROPOFOL N/A 06/17/2020   Procedure: COLONOSCOPY WITH PROPOFOL;  Surgeon: Irving Copas., MD;  Location: [facility];  Service: Gastroenterology;  Laterality: N/A;   COLONOSCOPY WITH PROPOFOL N/A 07/01/2020   Procedure: COLONOSCOPY WITH PROPOFOL;  Surgeon: Mauri Pole, MD;  [facility];  Service: Endoscopy;  Laterality: N/A;   ESOPHAGOGASTRODUODENOSCOPY (EGD) WITH PROPOFOL N/A 06/17/2020   Procedure: ESOPHAGOGASTRODUODENOSCOPY (EGD) WITH PROPOFOL;  Surgeon: Rush Landmark Telford Nab., MD;  [facility];  Service: Gastroenterology;  Laterality: N/A;   ESOPHAGOGASTRODUODENOSCOPY (EGD) WITH PROPOFOL N/A 07/01/2020   Procedure: ESOPHAGOGASTRODUODENOSCOPY (EGD) WITH PROPOFOL;  Surgeon: Mauri Pole, MD;  [facility];  Service: Endoscopy;  Laterality: N/A;   FOREIGN BODY REMOVAL  06/17/2020   Procedure: FOREIGN BODY REMOVAL;  Surgeon: Rush Landmark Telford Nab., MD;  Location: [facility];  Service: Gastroenterology;;   GI RADIOFREQUENCY ABLATION  06/17/2020   Procedure: GI RADIOFREQUENCY ABLATION;  Surgeon: Irving Copas., MD;  Location: [facility];  Service:  Gastroenterology;;   HEMOSTASIS CLIP PLACEMENT  06/17/2020   Procedure: HEMOSTASIS CLIP PLACEMENT;  Surgeon: Irving Copas., MD;  Location: [facility];  Service: Gastroenterology;;   HEMOSTASIS CLIP PLACEMENT  07/01/2020   Procedure: HEMOSTASIS CLIP PLACEMENT;  Surgeon: Mauri Pole, MD;  [facility];  Service: Endoscopy;;   HOT HEMOSTASIS N/A 07/01/2020   Procedure: HOT HEMOSTASIS (ARGON PLASMA COAGULATION/BICAP);  Surgeon: Mauri Pole, MD;  Location: [facility];  Service: Endoscopy;  Laterality: N/A;   IR PARACENTESIS  08/19/2020   IR PARACENTESIS  09/26/2020   IR PARACENTESIS  11/03/2020   IR PARACENTESIS  11/28/2020   IR PARACENTESIS  12/21/2020   IR PARACENTESIS  01/17/2021   IR PARACENTESIS  02/08/2021   IR PARACENTESIS  02/23/2021   IR RADIOLOGIST EVAL & MGMT  01/24/2017   IR RADIOLOGIST EVAL & MGMT  02/22/2021   KNEE CARTILAGE SURGERY Bilateral    NEPHRECTOMY Left 02/10/2015   Procedure: OPEN RETROPERINTONEAL EXPLORATION LEFT RENAL  CYST DECORTICATION X 5;  Surgeon: Cleon Gustin, MD;  [facility];  Service: Urology;  Laterality: Left;   PARTIAL COLECTOMY N/A 02/10/2015   Procedure: OPEN RIGHT  COLECTOMY ;  Surgeon: [name], MD;  [facility];  Service: General;  Laterality: N/A;   POLYPECTOMY  06/17/2020   Procedure: POLYPECTOMY;  Surgeon: Rush Landmark Telford Nab., MD;  Location: [facility];  Service: Gastroenterology;;   TUBAL LIGATION       Allergies: Aspirin and Codeine  Medications: Prior to Admission medications   Medication Sig Start Date End Date Taking? Authorizing Provider  acetaminophen (TYLENOL) 500 MG tablet Take 500 mg by mouth every 6 (six) hours as needed for moderate pain.   Yes [provider]  amLODipine (NORVASC) 2.5 MG tablet Take 1 tablet (2.5 mg total) by mouth daily. 12/04/19  Yes Brunetta Jeans, PA-C  ferrous sulfate 325 (65 FE) MG tablet Take 325 mg by mouth daily with breakfast.    Yes [provider]  furosemide (LASIX) 40 MG tablet Take 1.5 tablets (60 mg total) by mouth daily. Patient taking differently: Take 40 mg by mouth daily. 09/21/20  Yes [name], MD  glipiZIDE (GLUCOTROL XL) 5 MG 24 hr tablet Take 5 mg by mouth daily with breakfast.   Yes [provider]  levothyroxine (SYNTHROID) 88 MCG tablet Take 1 tablet (88 mcg total) by mouth daily. 12/04/19  Yes Brunetta Jeans, PA-C  loperamide (IMODIUM) 2 MG capsule Take 2 mg by mouth as needed for diarrhea or loose stools.   Yes [provider]  LORazepam (ATIVAN) 0.5 MG tablet Take 0.5 mg by mouth as needed for anxiety.   Yes [provider]  omeprazole (PRILOSEC) 20 MG capsule Take 1 capsule (20 mg total) by mouth 2 (two) times daily before a meal. 08/10/20  Yes Brunetta Jeans, PA-C  propranolol (INDERAL) 40 MG tablet Take 20 mg by mouth 2 (two) times daily. 09/30/20  Yes [provider]  Simethicone (GAS-X PO) Take 1 tablet by mouth as needed (flatulence).   Yes [provider]  telmisartan (MICARDIS) 40 MG tablet Take 0.5 tablets (20 mg total) by mouth in the morning and at bedtime. 07/12/20  Yes Eugenie Filler, MD  ACCU-CHEK AVIVA PLUS test strip  10/20/18   [provider]  Blood Glucose Monitoring Suppl (ACCU-CHEK AVIVA) device Use  as instructed 07/05/20 07/05/21  Eugenie Filler, MD  glipiZIDE (GLUCOTROL) 5 MG tablet Take 5 mg by mouth daily before breakfast. Patient not taking: Reported on 02/27/2021    [provider]  rivaroxaban (XARELTO) 20 MG TABS tablet Take 1 tablet (20 mg total) by mouth daily with supper. Patient not taking: Reported on 08/04/2020 07/08/20 08/10/20  Eugenie Filler, MD     Vital Signs: BP (!) 142/63   Pulse 65   Temp 98.2 F (36.8 C) (Oral)   Resp 18   Wt 203 lb 12.8 oz (92.4 kg)   SpO2 98%   BMI 31.92 kg/m   Physical Exam awake, alert.  Chest with slightly diminished breath sounds bases.   Heart with regular rate and rhythm, positive murmur.  Abdomen distended, few bowel sounds, currently nontender.  Bilateral lower extremity edema noted.  Imaging: No results found.  Labs:  CBC: Recent Labs    01/12/21 1053 01/26/21 1128 02/02/21 1053 03/02/21 1045  WBC 4.4 4.9 6.0 5.7  HGB 8.4* 9.1* 9.4* 10.6*  HCT 28.1* 30.5* 31.5* 35.6*  PLT 137* 125* 119* 142*    COAGS: Recent Labs    06/15/20 0923 06/30/20 1301 07/01/20 0032 03/02/21 1045  INR 1.3* 2.1* 1.6* 1.1    BMP: Recent Labs    12/13/20 0944 01/12/21 1053 02/22/21 1456 03/02/21 1045 03/02/21 1125 03/08/21 0700  NA 142 142 141 142 139 141  K 4.8 4.7 4.7 5.5* 4.8 5.1  CL 109 109 106 112* 107 109  CO2 26 25 26 24 25 23   GLUCOSE 115* 97 81 98 90 89  BUN 25* 24* 30* 41* 40* 41*  CALCIUM 8.5* 8.6* 8.7 8.8* 8.6 8.8*  CREATININE 1.54* 1.91* 2.10* 2.49* 2.46* 2.72*  GFRNONAA 34* 27*  --  19*  --  17*    LIVER FUNCTION TESTS: Recent Labs    08/23/20 1107 09/21/20 1543 01/12/21 1053 03/02/21 1045  BILITOT 0.5 0.7 0.7 0.7  AST 27 25 19 25   ALT 10 11 10 13   ALKPHOS 95 113 116 113  PROT 5.9* 6.5 5.9* 5.9*  ALBUMIN 3.0* 3.2* 3.1* 2.7*    Assessment and Plan: Patient known to IR service  from right lobe liver mass biopsy in 2018 as well as multiple paracenteses with last being on 8/11 yielding 8 L.  She also underwent consultation with Dr. Vernard Gambles on 02/22/2021 to discuss treatment options for liver tumor concerning for hepatocellular carcinoma.  She has a past medical history significant for cirrhosis, RLE DVT(post tibial), anemia, renal insufficiency, anxiety, aortic stenosis, arthritis, colon cancer in 2016, diabetes, GERD, gout, hypertension, hypothyroidism, and osteoporosis.  MRI of the liver on 01/03/2021 revealed:  1. There is a 2.9 x 2.6 cm arterially hyperenhancing lesion with washout and capsular enhancement in the lateral right lobe of the liver, hepatic segment VII. Signal and enhancement  characteristics are characteristic of hepatocellular carcinoma, hepatic metastatic disease from known colon malignancy distinctly not favored. LI-RADS category 5, consistent with hepatocellular carcinoma. 2. A second subcapsular lesion of the lateral inferior right lower lobe, hepatic segment VI, demonstrates internal fluid signal without significant contrast enhancement appreciated. This measures no greater than 1.1 cm and may reflect a small hemangioma or septated cyst. Attention on follow-up. 3. Cirrhosis. 4. Splenomegaly. 5. Small left pleural effusion, unchanged.  Following discussions with Dr. Vernard Gambles she was deemed an appropriate candidate for image guided microwave ablation of the segment 7 liver tumor along with possible biopsy as well as paracentesis.  She presents today for the procedure.  Details/risks of procedure, including but not limited to, internal bleeding, infection injury to adjacent structures, anesthesia related complications discussed with patient with her understanding and consent.  Post procedure patient will be admitted to the hospital for overnight observation.   Electronically Signed: D. Rowe Robert, PA-C 03/08/2021, 8:26 AM   I spent a total of 30 minutes at the the patient's bedside AND on the patient's hospital floor or unit, greater than 50% of which was counseling/coordinating care for CT-guided microwave ablation/ possible biopsy of liver tumor, possible paracentesis.

## 2021-03-08 NOTE — Anesthesia Procedure Notes (Signed)
Procedure Name: Intubation Date/Time: 03/08/2021 9:00 AM Performed by: Claudia Desanctis, CRNA Pre-anesthesia Checklist: Patient identified, Emergency Drugs available, Suction available and Patient being monitored Patient Re-evaluated:Patient Re-evaluated prior to induction Oxygen Delivery Method: Circle system utilized Preoxygenation: Pre-oxygenation with 100% oxygen Induction Type: IV induction Ventilation: Mask ventilation without difficulty Laryngoscope Size: 2 and Miller Grade View: Grade I Tube type: Oral Tube size: 7.0 mm Number of attempts: 1 Airway Equipment and Method: Stylet Placement Confirmation: ETT inserted through vocal cords under direct vision, positive ETCO2 and breath sounds checked- equal and bilateral Secured at: 21 cm Tube secured with: Tape Dental Injury: Teeth and Oropharynx as per pre-operative assessment

## 2021-03-08 NOTE — Procedures (Signed)
  Procedure: CT MW ablation R hepatic mass, CT core biopsy, US paracentesis   EBL:   minimal Complications:  none immediate  See full dictation in [facility].  Dillard Cannon MD Main # [phone] Pager  [phone]

## 2021-03-08 NOTE — Transfer of Care (Signed)
Immediate Anesthesia Transfer of Care Note  Patient: Shammara J Chien  Procedure(s) Performed: CT MICROWAVE ABLATION WITH ANESTHESIA  Patient Location: PACU  Anesthesia Type:General  Level of Consciousness: awake and patient cooperative  Airway & Oxygen Therapy: Patient Spontanous Breathing and Patient connected to face mask  Post-op Assessment: Report given to RN and Post -op Vital signs reviewed and stable  Post vital signs: Reviewed and stable  Last Vitals:  Vitals Value Taken Time  BP    Temp    Pulse 67 03/08/21 1114  Resp 20 03/08/21 1114  SpO2 97 % 03/08/21 1114  Vitals shown include unvalidated device data.  Last Pain:  Vitals:   03/08/21 0701  TempSrc:   PainSc: 0-No pain         Complications: No notable events documented.

## 2021-03-09 ENCOUNTER — Encounter (HOSPITAL_COMMUNITY): Payer: Self-pay | Admitting: Interventional Radiology

## 2021-03-09 DIAGNOSIS — C220 Liver cell carcinoma: Secondary | ICD-10-CM | POA: Diagnosis not present

## 2021-03-09 LAB — SURGICAL PATHOLOGY

## 2021-03-09 MED ORDER — ACETAMINOPHEN 500 MG PO TABS
1000.0000 mg | ORAL_TABLET | Freq: Four times a day (QID) | ORAL | Status: DC | PRN
  Administered 2021-03-09: 1000 mg via ORAL
  Filled 2021-03-09: qty 2

## 2021-03-09 MED ORDER — LORAZEPAM 0.5 MG PO TABS
0.5000 mg | ORAL_TABLET | Freq: Once | ORAL | Status: AC
  Administered 2021-03-09: 0.5 mg via ORAL
  Filled 2021-03-09: qty 1

## 2021-03-09 MED ORDER — FAMOTIDINE 20 MG PO TABS
10.0000 mg | ORAL_TABLET | Freq: Once | ORAL | Status: AC
  Administered 2021-03-09: 10 mg via ORAL
  Filled 2021-03-09: qty 1

## 2021-03-09 NOTE — Progress Notes (Signed)
Patient discharged to home with family, discharge instructions reviewed with patient who verbalized understanding. No new RX.

## 2021-03-09 NOTE — Discharge Summary (Signed)
Patient ID: Cheryl Jordan MRN: [id number] DOB/AGE: [birth year] 80 y.o.  Admit date: 03/08/2021 Discharge date: 03/09/2021  Supervising Physician: Arne Cleveland  Patient Status: [facility] - In-pt  Admission Diagnoses: Liver mass  Discharge Diagnoses:  Active Problems:   Hepatoma Cheryl Jordan)   Discharged Condition: good  Hospital Course:  Cheryl Jordan is a 80 year old female known to IR from prior right liver mass biopsy in 2018 as well as recurrent ascites s/p paracenteses PRN.  She consulted with Dr. Vernard Gambles 02/22/21 for treatment of her liver mass at which time she was deemed a candidate for microwave ablation of the tumor and she elected to proceed.  She presented to [facility] yesterday (8/24) for the procedure in her usual state of health.  She is s/p technically successful MWA of the right liver mass, paracentesis with 7L removed, as well as core biopsy of the mass.  She was admitted overnight for observation.  Her post-procedure course has been overall uneventful.  She is assessed at bedside this AM reports general complaints which are typical for her; mild nausea/indigestion, anxiety.  She also complains of back pain which originated under her shoulder blade.  She now describes this as a dull ache that is unchanged with palpation and slightly worse with movement.  She is given Tylenol 1000mg  with some relief as well as a heating pad with improvement.  Discussed that there may be some soreness related to the procedure as well as positional-related soreness from procedure/intubation yesterday.  Discussed the need for pain medication at home, however patient reports she is generally sensitive to pain medications which make her drowsy and "foggy-headed."  She is ok to try conservative measures with better control of her mild symptoms by resuming home Ativan (she take 0.5mg  daily) and her heartburn medications. Upon reassessment this morning, she is reportedly feeling better. She has been able to ambulate  in her room.  She has urinated and had a soft bowel movement.  She declines stool softeners for now.  She is ready for discharge home today.  Her granddaughter will be available for transportation and her husband is home this evening for assistance. She is given instructions for site care.  She understands schedulers will contact her with date and time of follow-up appointment.   Discharge Exam: Blood pressure (!) 117/47, pulse 74, temperature 97.7 F (36.5 C), temperature source Oral, resp. rate 18, height 5' 7.01" (1.702 m), weight 203 lb 12.8 oz (92.4 kg), SpO2 91 %. General appearance: alert, cooperative, and no distress Back: symmetric, no curvature. ROM normal. No CVA tenderness. Cardio: regular rate and rhythm, S1, S2 normal, no murmur, click, rub or gallop GI: soft, non-tender; bowel sounds normal; no masses,  no organomegaly Skin: Skin color, texture, turgor normal. No rashes or lesions Incision/Wound: Procedure sites intact, clean and dry.  No evidence of bruising.  Mild, expected tenderness at sites, not exquisitely tender.  MSK: No tenderness to back, spine, or shoulder with palpation.  Mild tenderness with movement, likely positionally-related soreness.   Disposition: Discharge disposition: 01-Home or Self Care       Discharge Instructions     Call MD for:  persistant nausea and vomiting   Complete by: As directed    Call MD for:  redness, tenderness, or signs of infection (pain, swelling, redness, odor or green/yellow discharge around incision site)   Complete by: As directed    Call MD for:  severe uncontrolled pain   Complete by: As directed  Call MD for:  temperature >100.4   Complete by: As directed    Diet - low sodium heart healthy   Complete by: As directed    Discharge instructions   Complete by: As directed    Increase activity slowly over the next few days.   May take Tylenol 500-1000mg  q 6 hrs for pain as needed.  Ok to take over-the-counter stool  softner if needed.  Schedulers will contact you with date and time of follow-up appointment with Dr. Vernard Gambles.  Ok to remove dressings in 24 hrs and replace with bandaids/gauze as needed. Do not submerge procedure sites in water for 3 days.   Increase activity slowly   Complete by: As directed    Remove dressing in 24 hours   Complete by: As directed    May replace with bandaid/gauze as needed.  Do not submerge procedure sites in water (bath, swimming pool) for 3 days.      Allergies as of 03/09/2021       Reactions   Aspirin Other (See Comments)   Runny nose    Codeine Nausea And Vomiting        Medication List     TAKE these medications    Accu-Chek Aviva device Use as instructed   Accu-Chek Aviva Plus test strip Generic drug: glucose blood   acetaminophen 500 MG tablet Commonly known as: TYLENOL Take 500 mg by mouth every 6 (six) hours as needed for moderate pain.   amLODipine 2.5 MG tablet Commonly known as: NORVASC Take 1 tablet (2.5 mg total) by mouth daily.   ferrous sulfate 325 (65 FE) MG tablet Take 325 mg by mouth daily with breakfast.   furosemide 40 MG tablet Commonly known as: LASIX Take 1.5 tablets (60 mg total) by mouth daily. What changed: how much to take   GAS-X PO Take 1 tablet by mouth as needed (flatulence).   glipiZIDE 5 MG 24 hr tablet Commonly known as: GLUCOTROL XL Take 5 mg by mouth daily with breakfast. What changed: Another medication with the same name was removed. Continue taking this medication, and follow the directions you see here.   levothyroxine 88 MCG tablet Commonly known as: SYNTHROID Take 1 tablet (88 mcg total) by mouth daily.   loperamide 2 MG capsule Commonly known as: IMODIUM Take 2 mg by mouth as needed for diarrhea or loose stools.   LORazepam 0.5 MG tablet Commonly known as: ATIVAN Take 0.5 mg by mouth as needed for anxiety.   omeprazole 20 MG capsule Commonly known as: PriLOSEC Take 1 capsule (20 mg  total) by mouth 2 (two) times daily before a meal.   propranolol 40 MG tablet Commonly known as: INDERAL Take 20 mg by mouth 2 (two) times daily.   telmisartan 40 MG tablet Commonly known as: MICARDIS Take 0.5 tablets (20 mg total) by mouth in the morning and at bedtime.        Follow-up Information     Arne Cleveland, MD Follow up.   Specialties: Interventional Radiology, Radiology Why: Schedulers will contact you with date and time of follow-up appointment. Contact information: [place] [address] [phone]                  Electronically Signed: Docia Barrier, PA 03/09/2021, 12:17 PM   I have spent Greater Than 30 Minutes discharging Cheryl Jordan.

## 2021-03-09 NOTE — Anesthesia Postprocedure Evaluation (Signed)
Anesthesia Post Note  [name]  Procedure(s) Performed: CT MICROWAVE ABLATION WITH ANESTHESIA     Patient location during evaluation: PACU Anesthesia Type: General Level of consciousness: awake and alert and oriented Pain management: pain level controlled Vital Signs Assessment: post-procedure vital signs reviewed and stable Respiratory status: spontaneous breathing, nonlabored ventilation and respiratory function stable Cardiovascular status: blood pressure returned to baseline Postop Assessment: no apparent nausea or vomiting Anesthetic complications: no   No notable events documented.       Marthenia Rolling

## 2021-03-14 ENCOUNTER — Other Ambulatory Visit: Payer: Self-pay

## 2021-03-14 DIAGNOSIS — D649 Anemia, unspecified: Secondary | ICD-10-CM

## 2021-03-14 NOTE — Progress Notes (Signed)
Labs entered per phlebotomy

## 2021-03-19 ENCOUNTER — Telehealth: Payer: Self-pay

## 2021-03-19 NOTE — Telephone Encounter (Signed)
Called and left message for patient to call PCP's office and schedule her Medicare Wellness Visit.

## 2021-03-21 ENCOUNTER — Telehealth: Payer: Self-pay | Admitting: Internal Medicine

## 2021-03-21 ENCOUNTER — Other Ambulatory Visit: Payer: Self-pay

## 2021-03-21 ENCOUNTER — Encounter: Payer: Self-pay | Admitting: Nurse Practitioner

## 2021-03-21 ENCOUNTER — Inpatient Hospital Stay: Payer: Medicare Other | Attending: Oncology

## 2021-03-21 ENCOUNTER — Inpatient Hospital Stay (HOSPITAL_BASED_OUTPATIENT_CLINIC_OR_DEPARTMENT_OTHER): Payer: Medicare Other | Admitting: Nurse Practitioner

## 2021-03-21 VITALS — BP 132/69 | HR 85 | Temp 98.7°F | Resp 18 | Ht 67.0 in | Wt 209.8 lb

## 2021-03-21 DIAGNOSIS — C189 Malignant neoplasm of colon, unspecified: Secondary | ICD-10-CM | POA: Diagnosis not present

## 2021-03-21 DIAGNOSIS — Z9049 Acquired absence of other specified parts of digestive tract: Secondary | ICD-10-CM | POA: Insufficient documentation

## 2021-03-21 DIAGNOSIS — C180 Malignant neoplasm of cecum: Secondary | ICD-10-CM | POA: Diagnosis not present

## 2021-03-21 DIAGNOSIS — E119 Type 2 diabetes mellitus without complications: Secondary | ICD-10-CM | POA: Insufficient documentation

## 2021-03-21 DIAGNOSIS — R188 Other ascites: Secondary | ICD-10-CM

## 2021-03-21 DIAGNOSIS — K7469 Other cirrhosis of liver: Secondary | ICD-10-CM | POA: Diagnosis not present

## 2021-03-21 DIAGNOSIS — M109 Gout, unspecified: Secondary | ICD-10-CM | POA: Insufficient documentation

## 2021-03-21 DIAGNOSIS — C778 Secondary and unspecified malignant neoplasm of lymph nodes of multiple regions: Secondary | ICD-10-CM | POA: Diagnosis not present

## 2021-03-21 DIAGNOSIS — K922 Gastrointestinal hemorrhage, unspecified: Secondary | ICD-10-CM | POA: Diagnosis not present

## 2021-03-21 DIAGNOSIS — K769 Liver disease, unspecified: Secondary | ICD-10-CM | POA: Insufficient documentation

## 2021-03-21 DIAGNOSIS — R161 Splenomegaly, not elsewhere classified: Secondary | ICD-10-CM | POA: Insufficient documentation

## 2021-03-21 DIAGNOSIS — D509 Iron deficiency anemia, unspecified: Secondary | ICD-10-CM | POA: Diagnosis present

## 2021-03-21 DIAGNOSIS — N2889 Other specified disorders of kidney and ureter: Secondary | ICD-10-CM | POA: Diagnosis not present

## 2021-03-21 DIAGNOSIS — I82401 Acute embolism and thrombosis of unspecified deep veins of right lower extremity: Secondary | ICD-10-CM | POA: Diagnosis not present

## 2021-03-21 DIAGNOSIS — K7460 Unspecified cirrhosis of liver: Secondary | ICD-10-CM

## 2021-03-21 DIAGNOSIS — D649 Anemia, unspecified: Secondary | ICD-10-CM

## 2021-03-21 DIAGNOSIS — I10 Essential (primary) hypertension: Secondary | ICD-10-CM | POA: Insufficient documentation

## 2021-03-21 LAB — BASIC METABOLIC PANEL - CANCER CENTER ONLY
Anion gap: 8 (ref 5–15)
BUN: 39 mg/dL — ABNORMAL HIGH (ref 8–23)
CO2: 25 mmol/L (ref 22–32)
Calcium: 9 mg/dL (ref 8.9–10.3)
Chloride: 108 mmol/L (ref 98–111)
Creatinine: 2.46 mg/dL — ABNORMAL HIGH (ref 0.44–1.00)
GFR, Estimated: 19 mL/min — ABNORMAL LOW (ref >60)
Glucose, Bld: 107 mg/dL — ABNORMAL HIGH (ref 70–99)
Potassium: 5 mmol/L (ref 3.5–5.1)
Sodium: 141 mmol/L (ref 135–145)

## 2021-03-21 LAB — CBC WITH DIFFERENTIAL (CANCER CENTER ONLY)
Abs Immature Granulocytes: 0.01 10*3/uL (ref 0.00–0.07)
Basophils Absolute: 0 10*3/uL (ref 0.0–0.1)
Basophils Relative: 0 %
Eosinophils Absolute: 0.1 10*3/uL (ref 0.0–0.5)
Eosinophils Relative: 1 %
HCT: 35 % — ABNORMAL LOW (ref 36.0–46.0)
Hemoglobin: 10.6 g/dL — ABNORMAL LOW (ref 12.0–15.0)
Immature Granulocytes: 0 %
Lymphocytes Relative: 28 %
Lymphs Abs: 1.5 10*3/uL (ref 0.7–4.0)
MCH: 28 pg (ref 26.0–34.0)
MCHC: 30.3 g/dL (ref 30.0–36.0)
MCV: 92.3 fL (ref 80.0–100.0)
Monocytes Absolute: 0.4 10*3/uL (ref 0.1–1.0)
Monocytes Relative: 7 %
Neutro Abs: 3.6 10*3/uL (ref 1.7–7.7)
Neutrophils Relative %: 64 %
Platelet Count: 130 10*3/uL — ABNORMAL LOW (ref 150–400)
RBC: 3.79 MIL/uL — ABNORMAL LOW (ref 3.87–5.11)
RDW: 18.1 % — ABNORMAL HIGH (ref 11.5–15.5)
WBC Count: 5.6 10*3/uL (ref 4.0–10.5)
nRBC: 0 % (ref 0.0–0.2)

## 2021-03-21 NOTE — Telephone Encounter (Signed)
Inbound call from patient. States she is nausea, can't catch breath, and very uncomfortable. Need paracentesis.

## 2021-03-21 NOTE — Telephone Encounter (Signed)
Pt scheduled for a paracentesis for 03/22/21 @ at 9:00 AM at [facility] in the radiology department.  Pt to arrive at 8:45. Pt was reminded about [facility] and the referral that had been made. Pt stated that she did receive a call from them but unfortunately she has had phone problems and got cut off. Pt was given the number to [facility] and encouraged to reach out to them. Pt was notified if they do not have her in their  system to contact us and we will reach out to the office.

## 2021-03-21 NOTE — Telephone Encounter (Signed)
Unfortunately, her ascites is becoming more difficult to manage with time. She has renal insufficiency for which I would like her to see a nephrologist (I believe we requested this previously). Paracentesis up to 5 L. IV albumin replacement per protocol Send fluid for cell count with differential

## 2021-03-21 NOTE — Progress Notes (Signed)
[place] OFFICE PROGRESS NOTE   Diagnosis: Anemia, HCC  INTERVAL HISTORY:   Cheryl Jordan returns as scheduled.  She underwent biopsy and thermal ablation of a liver lesion 03/08/2021.  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  She reports intermittent nausea over many months.  She is nauseated at present.  Zofran is not effective.  She would like something else.  She has contacted Dr. Blanch Media office regarding a paracentesis and is awaiting scheduling of this.  She denies bleeding.  She has intermittent dyspnea on exertion.  Objective:  Vital signs in last 24 hours:  Blood pressure 132/69, pulse 85, temperature 98.7 F (37.1 C), temperature source Oral, resp. rate 18, height _0  (1.702 m), weight 209 lb 12.8 oz (95.2 kg), SpO2 96 %.    HEENT: No thrush or ulcers. Resp: Distant breath sounds.  No respiratory distress. Cardio: Regular rate and rhythm. GI: Abdomen is markedly distended consistent with ascites. Vascular: Trace pitting edema lower leg bilaterally. Skin: Pale appearing.   Lab Results:  Lab Results  Component Value Date   WBC 5.6 03/21/2021   HGB 10.6 (L) 03/21/2021   HCT 35.0 (L) 03/21/2021   MCV 92.3 03/21/2021   PLT 130 (L) 03/21/2021   NEUTROABS 3.6 03/21/2021    Imaging:  No results found.  Medications: I have reviewed the patient's current medications.  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 Loss of MLH1 and PMS2 expression Microsatellite instability-high CT chest 03/03/2015-negative for metastatic disease Cycle 1 adjuvant Xeloda 03/18/2015 Cycle 2 adjuvant Xeloda 04/09/2015 Cycle 3 adjuvant Xeloda  04/30/2015 Cycle 4 adjuvant Xeloda 05/21/2015 Cycle 5 adjuvant Xeloda 06/11/2015 (dose reduced due to desquamation of feet). Cycle 6 adjuvant Xeloda 07/02/2015 Cycle 7 adjuvant Xeloda 07/23/2015 Cycle 8 adjuvant Xeloda 08/13/2015 CTs chest, abdomen, and pelvis 02/06/2016-cirrhosis, heterogenous enhancement of the liver, small hypoenhancing lesions in the liver-? 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 Likely regenerative nodules in the liver; does not appear as [place] or metastases. Left renal mass very slow growing. CT scan recommended in one year) Colonoscopy 08/06/2016-multiple 5-15 mm very subtle sessile serrated adenomas found in the transverse colon and at the anastomosis. Next colonoscopy at a one-year interval. CT 12/23/2017- no evidence of recurrent colon cancer, cirrhotic liver with no dominant mass, left renal mass-cystic component mildly larger compared to 2017 Colonoscopy 06/17/2020-hyperplastic polyps and a tubular adenoma removed from the rectum and colon      2. Anemia, microcytic. 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 She is followed by Dr. Alyson Ingles, urology. Unchanged on CT 02/06/2016, unchanged on MRI 12/06/2016, cystic component slightly larger on CT 12/23/2017 Diabetes Hypertension History of mild hand/foot syndrome secondary to Xeloda Enlarging segment 8 liver lesion noted on MRI 12/06/2016-felt to potentially represent a metastasis or HCC Ultrasound-guided biopsy of the lesion on 02/06/2017 revealed cirrhosis, no malignancy CT 12/23/2017- chronic liver nodularity, no dominant mass, changes of cirrhosis CT 04/29/2020- enlarging right liver lesion concerning for [facility], separate from the lesion biopsied in 2018 CT  12/13/2020- enlargement of right hepatic lesion, probable second more subtle lesion in  the posterior right liver, progression of ascites with nodularity of the omentum-edema related? MRI liver 01/03/2021-2.9 x 2.6 cm segment 7 lesion, Li-RADS 5, second segment 6 lesion, 1.1 cm, hemangioma or septated cyst, cirrhosis, splenomegaly, small left pleural effusion-unchanged Biopsy and thermal ablation of a liver lesion 03/08/2021.  Pathology shows well to moderately differentiated hepatocellular carcinoma. Cirrhosis History of mild thrombocytopenia-likely secondary to cirrhosis 10. 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  Gout flare left great toe 02/02/2021-short course of prednisone        Disposition: Ms. Schwanke appears unchanged.  Dr. Benay Spice reviewed the pathology report from the liver biopsy with her at today's visit.  She understands pathology shows hepatocellular carcinoma.  She has significant ascites on exam today.  She is trying to get a paracentesis scheduled for later this week.  We reviewed the CBC from today.  Hemoglobin is stable.  She will return for lab and follow-up in approximately 4 weeks.  She will contact the office in the interim with any problems.  Patient seen with Dr. Benay Spice.  Ned Card ANP/GNP-BC   03/21/2021  11:24 AM  This was a shared visit with Ned Card.  Ms. Maggard was interviewed and examined.  She has marked ascites and plans to schedule a paracentesis this week.  She underwent ablation of the isolated [place] last week.  We will plan for a restaging liver scan in 3-4 months.  The hemoglobin is stable today.  She will return for an office visit and repeat iron studies in 1 month.  I was present for greater than 50% of today's visit.  I performed medical decision making.  Julieanne Manson, MD

## 2021-03-21 NOTE — Telephone Encounter (Signed)
Pt called In and states that she is requesting paracentesis  Pt stating that she has had a weight gain of 11 to 12 lbs, her clothes are fitting tighter, feels like her skin is stretching and increased shortness of breath. Please advise

## 2021-03-22 ENCOUNTER — Other Ambulatory Visit: Payer: Self-pay

## 2021-03-22 ENCOUNTER — Telehealth: Payer: Self-pay

## 2021-03-22 ENCOUNTER — Ambulatory Visit (HOSPITAL_COMMUNITY)
Admission: RE | Admit: 2021-03-22 | Discharge: 2021-03-22 | Disposition: A | Discharge status: Home / Self Care | Payer: Medicare Other | Source: Ambulatory Visit | Attending: Internal Medicine | Admitting: Internal Medicine

## 2021-03-22 DIAGNOSIS — R188 Other ascites: Secondary | ICD-10-CM | POA: Diagnosis not present

## 2021-03-22 DIAGNOSIS — K7460 Unspecified cirrhosis of liver: Secondary | ICD-10-CM

## 2021-03-22 HISTORY — PX: IR PARACENTESIS: IMG2679

## 2021-03-22 LAB — BODY FLUID CELL COUNT WITH DIFFERENTIAL
Eos, Fluid: 0 %
Lymphs, Fluid: 73 %
Monocyte-Macrophage-Serous Fluid: 8 % — ABNORMAL LOW (ref 50–90)
Neutrophil Count, Fluid: 19 % (ref 0–25)
Total Nucleated Cell Count, Fluid: 306 cu mm (ref 0–1000)

## 2021-03-22 MED ORDER — ALBUMIN HUMAN 25 % IV SOLN
INTRAVENOUS | Status: AC
  Filled 2021-03-22: qty 50

## 2021-03-22 MED ORDER — LIDOCAINE HCL 1 % IJ SOLN
INTRAMUSCULAR | Status: DC | PRN
  Administered 2021-03-22: 10 mL

## 2021-03-22 MED ORDER — ALBUMIN HUMAN 25 % IV SOLN
12.5000 g | Freq: Once | INTRAVENOUS | Status: AC
  Administered 2021-03-22: 12.5 g via INTRAVENOUS

## 2021-03-22 MED ORDER — LIDOCAINE HCL 1 % IJ SOLN
INTRAMUSCULAR | Status: AC
  Filled 2021-03-22: qty 20

## 2021-03-22 NOTE — Procedures (Signed)
Ultrasound-guided diagnostic and therapeutic paracentesis performed yielding 5 liters of straw colored fluid.  Fluid was sent to lab for analysis. No immediate complications. EBL is none.

## 2021-03-22 NOTE — Telephone Encounter (Signed)
TC from Pt stating she was in the office and requested a refill  for antiemetic. Informed Pt that per Dr Benay Spice Pt should call Dr Henrene Pastor for refill on antiemetic. Pt verbalized understanding.

## 2021-03-24 LAB — PATHOLOGIST SMEAR REVIEW: Path Review: 9082022

## 2021-03-28 ENCOUNTER — Ambulatory Visit
Admission: RE | Admit: 2021-03-28 | Discharge: 2021-03-28 | Disposition: A | Discharge status: Home / Self Care | Payer: Medicare Other | Source: Ambulatory Visit | Attending: Student | Admitting: Student

## 2021-03-28 ENCOUNTER — Other Ambulatory Visit (HOSPITAL_COMMUNITY): Payer: Self-pay | Admitting: Interventional Radiology

## 2021-03-28 ENCOUNTER — Encounter: Payer: Self-pay | Admitting: *Deleted

## 2021-03-28 ENCOUNTER — Other Ambulatory Visit: Payer: Self-pay

## 2021-03-28 DIAGNOSIS — R160 Hepatomegaly, not elsewhere classified: Secondary | ICD-10-CM

## 2021-03-28 HISTORY — PX: IR RADIOLOGIST EVAL & MGMT: IMG5224

## 2021-03-28 MED ORDER — PROCHLORPERAZINE MALEATE 5 MG PO TABS
5.0000 mg | ORAL_TABLET | Freq: Four times a day (QID) | ORAL | 0 refills | Status: DC | PRN
Start: 2021-03-28 — End: 2021-05-11

## 2021-03-28 NOTE — Progress Notes (Signed)
Patient ID: Cheryl Jordan, female   DOB: August 13, 1940, 80 y.o.   MRN: [id number]       Chief Complaint: Patient was consulted remotely today ([place]) for follow-up liver microwave ablation at the request of Matthews,Kacie Sue-Ellen.    Referring Physician(s): Julieanne Manson, MD  History of Present Illness: Cheryl Jordan is a 80 y.o. 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 02/06/2016 CT demonstrated hepatic cirrhosis, left renal mass 12/06/2016 liver MR demonstrated enlarging segment 8 lesion 02/06/2017 ultrasound-guided liver biopsy negative for neoplasm 04/29/2020 CT showed enlargement of a separate right liver lesion 12/13/2020 CT showed enlargement of right hepatic lesion, and a second posterior right subtle lesion 01/03/2021 liver MR demonstrated 2.6 x 2.9 cm segment 7 LiRADS 5 lesion, 1.1 cm segment 6 benign-appearing lesion The patient  has also had recurrent symptomatic abdominal ascites requiring multiple paracentesis procedures; cytology has been negative for malignancy.    03/08/2021 patient underwent uncomplicated CT-guided microwave ablation of the right hepatic lesion, with core biopsy. 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   Patient had some regional pain immediately post procedure which is resolved.  She still has some mild right upper quadrant discomfort.  Her main complaint is nausea which is been quite debilitating, and limits her ability to eat although she is not really having any vomiting.  She is not taken anything for the nausea.  She also has a cough, nonproductive, which she says has been persistent since the anesthetic for the ablation procedure.  No fever or chills. She did require follow-up paracentesis on 03/22/2021 removing 5 L.  Past Medical History:  Diagnosis Date   Allergy     Anemia    hx of   Anxiety    Aortic stenosis    mild by 04/08/20 echo   Arthritis    Colon cancer ([facility]) dx'd 11/2014   Diabetes mellitus without complication (HCC)    TYPE II   Dyspnea    Fatty liver    Gallbladder disease    GERD (gastroesophageal reflux disease)    Heart murmur    History of gout    Hypertension    Hypothyroidism    Kidney disorder    spot on left kidney   Osteoporosis    PONV (postoperative nausea and vomiting)     Past Surgical History:  Procedure Laterality Date   BIOPSY  06/17/2020   Procedure: BIOPSY;  Surgeon: Irving Copas., MD;  Location: [facility];  Service: Gastroenterology;;   CATARACT EXTRACTION Left    CHOLECYSTECTOMY     COLONOSCOPY WITH PROPOFOL N/A 06/17/2020   Procedure: COLONOSCOPY WITH PROPOFOL;  Surgeon: Irving Copas., MD;  Location: [facility];  Service: Gastroenterology;  Laterality: N/A;   COLONOSCOPY WITH PROPOFOL N/A 07/01/2020   Procedure: COLONOSCOPY WITH PROPOFOL;  Surgeon: Mauri Pole, MD;  Location: [facility];  Service: Endoscopy;  Laterality: N/A;   ESOPHAGOGASTRODUODENOSCOPY (EGD) WITH PROPOFOL N/A 06/17/2020   Procedure: ESOPHAGOGASTRODUODENOSCOPY (EGD) WITH PROPOFOL;  Surgeon: Rush Landmark Telford Nab., MD;  Location: [facility];  Service: Gastroenterology;  Laterality: N/A;   ESOPHAGOGASTRODUODENOSCOPY (EGD) WITH PROPOFOL N/A 07/01/2020   Procedure: ESOPHAGOGASTRODUODENOSCOPY (EGD) WITH PROPOFOL;  Surgeon: Mauri Pole, MD;  Location: [facility];  Service: Endoscopy;  Laterality: N/A;   FOREIGN BODY REMOVAL  06/17/2020   Procedure: FOREIGN BODY REMOVAL;  Surgeon: Irving Copas.,  MD;  Location: [facility];  Service: Gastroenterology;;   GI RADIOFREQUENCY ABLATION  06/17/2020   Procedure: GI RADIOFREQUENCY ABLATION;  Surgeon: Irving Copas., MD;  Location: [facility];  Service: Gastroenterology;;   HEMOSTASIS CLIP PLACEMENT  06/17/2020   Procedure:  HEMOSTASIS CLIP PLACEMENT;  Surgeon: Irving Copas., MD;  Location: [facility];  Service: Gastroenterology;;   HEMOSTASIS CLIP PLACEMENT  07/01/2020   Procedure: HEMOSTASIS CLIP PLACEMENT;  Surgeon: Mauri Pole, MD;  Location: [facility];  Service: Endoscopy;;   HOT HEMOSTASIS N/A 07/01/2020   Procedure: HOT HEMOSTASIS (ARGON PLASMA COAGULATION/BICAP);  Surgeon: Mauri Pole, MD;  Location: [facility];  Service: Endoscopy;  Laterality: N/A;   IR PARACENTESIS  08/19/2020   IR PARACENTESIS  09/26/2020   IR PARACENTESIS  11/03/2020   IR PARACENTESIS  11/28/2020   IR PARACENTESIS  12/21/2020   IR PARACENTESIS  01/17/2021   IR PARACENTESIS  02/08/2021   IR PARACENTESIS  02/23/2021   IR PARACENTESIS  03/22/2021   IR RADIOLOGIST EVAL & MGMT  01/24/2017   IR RADIOLOGIST EVAL & MGMT  02/22/2021   KNEE CARTILAGE SURGERY Bilateral    NEPHRECTOMY Left 02/10/2015   Procedure: OPEN RETROPERINTONEAL EXPLORATION LEFT RENAL  CYST DECORTICATION X 5;  Surgeon: Cleon Gustin, MD;  Location: [facility];  Service: Urology;  Laterality: Left;   PARTIAL COLECTOMY N/A 02/10/2015   Procedure: OPEN RIGHT  COLECTOMY ;  Surgeon: [name], MD;  Location: [facility];  Service: General;  Laterality: N/A;   POLYPECTOMY  06/17/2020   Procedure: POLYPECTOMY;  Surgeon: Irving Copas., MD;  Location: [facility];  Service: Gastroenterology;;   RADIOLOGY WITH ANESTHESIA N/A 03/08/2021   Procedure: CT MICROWAVE ABLATION WITH ANESTHESIA;  Surgeon: Arne Cleveland, MD;  Location: [facility];  Service: Radiology;  Laterality: N/A;   TUBAL LIGATION      Allergies: Aspirin and Codeine  Medications: Prior to Admission medications   Medication Sig Start Date End Date Taking? Authorizing Provider  ACCU-CHEK AVIVA PLUS test strip  10/20/18   [provider]  Acetaminophen (ACETAMINOPHEN EXTRA STRENGTH) 500 MG capsule     [provider]  acetaminophen (TYLENOL) 500 MG tablet Take  500 mg by mouth every 6 (six) hours as needed for moderate pain.    [provider]  amLODipine (NORVASC) 2.5 MG tablet Take 1 tablet (2.5 mg total) by mouth daily. 12/04/19   Brunetta Jeans, PA-C  Blood Glucose Calibration (ACCU-CHEK AVIVA) SOLN     [provider]  Blood Glucose Monitoring Suppl (ACCU-CHEK AVIVA) device Use as instructed 07/05/20 07/05/21  Eugenie Filler, MD  dapagliflozin propanediol (FARXIGA) 10 MG TABS tablet  09/01/20   [provider]  ferrous sulfate 325 (65 FE) MG tablet Take 325 mg by mouth daily with breakfast.    [provider]  furosemide (LASIX) 40 MG tablet Take 1.5 tablets (60 mg total) by mouth daily. Patient taking differently: Take 40 mg by mouth daily. 09/21/20   [name], MD  gabapentin (NEURONTIN) 100 MG capsule  03/03/21   [provider]  glipiZIDE (GLUCOTROL XL) 5 MG 24 hr tablet Take 5 mg by mouth daily with breakfast.    [provider]  levothyroxine (SYNTHROID) 88 MCG tablet Take 1 tablet (88 mcg total) by mouth daily. 12/04/19   Brunetta Jeans, PA-C  loperamide (IMODIUM) 2 MG capsule Take 2 mg by mouth as needed for diarrhea or loose stools.    [provider]  LORazepam (ATIVAN) 0.5 MG tablet Take 0.5 mg by mouth as needed for anxiety.    [provider]  omeprazole (PRILOSEC) 20 MG capsule Take 1 capsule (20 mg total) by mouth 2 (two) times daily before a meal. 08/10/20   Brunetta Jeans, PA-C  ondansetron (ZOFRAN) 4 MG tablet Take 4 mg by mouth every 4 (four) hours as needed. 03/03/21   [provider]  prochlorperazine (COMPAZINE) 5 MG tablet Take 1 tablet (5 mg total) by mouth every 6 (six) hours as needed for nausea or vomiting. 03/28/21   Arne Cleveland, MD  propranolol (INDERAL) 40 MG tablet Take 20 mg by mouth 2 (two) times daily. 09/30/20   [provider]  Simethicone (GAS-X PO) Take 1 tablet by mouth as needed (flatulence).    [provider]  sucralfate (CARAFATE) 1 GM/10ML suspension     [provider]  telmisartan (MICARDIS) 40 MG tablet Take 0.5 tablets (20 mg total) by mouth in the morning and at bedtime. 07/12/20   Eugenie Filler, MD  rivaroxaban (XARELTO) 20 MG TABS tablet Take 1 tablet (20 mg total) by mouth daily with supper. Patient not taking: Reported on 08/04/2020 07/08/20 08/10/20  Eugenie Filler, MD     Family History  Problem Relation Age of Onset   Diabetes Mother    Hypertension Other    COPD Father    Hypertension Sister    GER disease Sister    Heart attack Brother    COPD Brother    Lung cancer Brother    COPD Brother    Hypertension Brother     Social History   Socioeconomic History   Marital status: Married    Spouse name: Not on file   Number of children: 2   Years of education: Not on file   Highest education level: Not on file  Occupational History   Occupation: Retired  Tobacco Use   Smoking status: Never   Smokeless tobacco: Never  Vaping Use   Vaping Use: Never used  Substance and Sexual Activity   Alcohol use: No   Drug use: No   Sexual activity: Never  Other Topics Concern   Not on file  Social History Narrative   03/03/15-Married, husband Jeneen Rinks   #2 grown sons and #2 grand daughters   Social Determinants of Health   Financial Resource Strain: Not on file  Food Insecurity: Not on file  Transportation Needs: Not on file  Physical Activity: Not on file  Stress: Not on file  Social Connections: Not on file    ECOG Status: 2 - Symptomatic, <50% confined to bed  Review of Systems  Review of Systems: A 12 point ROS discussed and pertinent positives are indicated in the HPI above.  All other systems are negative.  Physical Exam No direct physical exam was performed (except for noted visual exam findings with Video Visits).     Vital Signs: There were no vitals taken for this visit.  Imaging: DG Chest 1 View  Result Date:  03/02/2021 CLINICAL DATA:  Hypertension. EXAM: CHEST  1 VIEW COMPARISON:  May 10, 2020. FINDINGS: Stable cardiomediastinal silhouette. No pneumothorax is noted. Hypoinflation of the lungs is noted with mild bibasilar subsegmental atelectasis. Bony thorax is unremarkable. IMPRESSION: Hypoinflation of the lungs with mild bibasilar subsegmental atelectasis. Electronically Signed   By: Marijo Conception M.D.   On: 03/02/2021 15:46   CT GUIDE TISSUE ABLATION  Result Date: 03/08/2021 INDICATION: Right hepatic lobe LI-RADS  5 probable hepatocellular carcinoma. EXAM: CT-GUIDED PERCUTANEOUS THERMAL ABLATION OF LIVER LESION CT-GUIDED BIOPSY LIVER LESION ULTRASOUND-GUIDED PARACENTESIS ANESTHESIA/SEDATION: General MEDICATIONS: mefoxin 2 g IV. The antibiotic was administered in an appropriate time interval prior to needle puncture of the skin. CONTRAST:  None required PROCEDURE: The procedure, risks, benefits, and alternatives were explained to the patient. Questions regarding the procedure were encouraged and answered. The patient understands and consents to the procedure. The patient was placed under general anesthesia. Initial ultrasound scanning demonstrates a moderate amount of ascites within the right lower abdominal quadrant. The right lower abdomen was prepped and draped in the usual sterile fashion. 1% lidocaine was used for local anesthesia. Following this, a 6 Fr Safe-T-Centesis catheter was introduced. An ultrasound image was saved for documentation purposes. The paracentesis was performed. Patient was then positioned prone on the CT gantry. Initial unenhanced CT was performed in a LAO position to localize the right hepatic lobe lesion, corresponding with previous MR and CT studies. The procedure was planned. The patient was prepped with chlorhexidine in a sterile fashion, and a sterile drape was applied covering the operative field. A sterile gown and sterile gloves were used for the procedure. A 25 gauge  needle was advanced in expected trajectory of the microwave ablation probe for procedural planning. Once the appropriate trajectory was established, a 17 gauge PR MWA percutaneous microwave ablation probe was advanced into the superior aspect right hepatic lesion under intermittent CT guidance. Subsequently, A 25 gauge needle was advanced in expected trajectory of the microwave ablation probe for procedural planning. Once the appropriate trajectory was established, a second 17 gauge PR MWA percutaneous microwave ablation probe was advanced into the inferior aspect right hepatic lesion under intermittent CT guidance. Real time reformatted images were generated during the procedure to establish appropriate depth of the ablation probe and to insure adequate treatment coverage. Once appropriate positioning was confirmed, a 17 gauge trocar needle was advanced to the margin of the lesion is midportion, through which coaxial 18 gauge core biopsy needle passage X 3 performed, obtaining solid-appearing samples submitted in formalin to surgical pathology. Subsequently, 10-minute ablation was performed. Follow-up CT was obtained. Tract ablation was performed as the microwave ablation probe was removed. Hemostasis was achieved with manual compression. A limited postprocedural scan was performed. A dressing was placed. COMPLICATIONS: None immediate. FINDINGS: Ultrasound demonstrated moderate abdominal ascites. Paracentesis performed as above, removing 7.2 L clear yellow ascites. Under intermittent CT guidance, a percutaneous microwave ablation probes inserted into the mass. Real-time reformatted images generated during the procedure to ensure adequate treatment coverage. Post ablation imaging demonstrates an adequate ablation zone and was negative for obvious complication, specifically, no significant hemorrhage about the ablation site. IMPRESSION: 1. Successful CT guided percutaneous thermal ablation of right hepatic lesion.  The patient will be observed overnight. Initial follow-up will be performed in approximately 4 weeks. 2. Technically successful CT-guided core biopsy, right hepatic lesion. 3. Ultrasound-guided paracentesis removing  7.2 L. Electronically Signed   By: Lucrezia Europe M.D.   On: 03/08/2021 14:00   CT BIOPSY  Result Date: 03/09/2021 INDICATION: Right hepatic lobe LI-RADS 5 probable hepatocellular carcinoma. EXAM: CT-GUIDED PERCUTANEOUS THERMAL ABLATION OF LIVER LESION CT-GUIDED BIOPSY LIVER LESION ULTRASOUND-GUIDED PARACENTESIS ANESTHESIA/SEDATION: General MEDICATIONS: mefoxin 2 g IV. The antibiotic was administered in an appropriate time interval prior to needle puncture of the skin. CONTRAST:  None required PROCEDURE: The procedure, risks, benefits, and alternatives were explained to the patient. Questions regarding the procedure were encouraged and answered. The patient  understands and consents to the procedure. The patient was placed under general anesthesia. Initial ultrasound scanning demonstrates a moderate amount of ascites within the right lower abdominal quadrant. The right lower abdomen was prepped and draped in the usual sterile fashion. 1% lidocaine was used for local anesthesia. Following this, a 6 Fr Safe-T-Centesis catheter was introduced. An ultrasound image was saved for documentation purposes. The paracentesis was performed. Patient was then positioned prone on the CT gantry. Initial unenhanced CT was performed in a LAO position to localize the right hepatic lobe lesion, corresponding with previous MR and CT studies. The procedure was planned. The patient was prepped with chlorhexidine in a sterile fashion, and a sterile drape was applied covering the operative field. A sterile gown and sterile gloves were used for the procedure. A 25 gauge needle was advanced in expected trajectory of the microwave ablation probe for procedural planning. Once the appropriate trajectory was established, a 17 gauge PR  MWA percutaneous microwave ablation probe was advanced into the superior aspect right hepatic lesion under intermittent CT guidance. Subsequently, A 25 gauge needle was advanced in expected trajectory of the microwave ablation probe for procedural planning. Once the appropriate trajectory was established, a second 17 gauge PR MWA percutaneous microwave ablation probe was advanced into the inferior aspect right hepatic lesion under intermittent CT guidance. Real time reformatted images were generated during the procedure to establish appropriate depth of the ablation probe and to insure adequate treatment coverage. Once appropriate positioning was confirmed, a 17 gauge trocar needle was advanced to the margin of the lesion is midportion, through which coaxial 18 gauge core biopsy needle passage X 3 performed, obtaining solid-appearing samples submitted in formalin to surgical pathology. Subsequently, 10-minute ablation was performed. Follow-up CT was obtained. Tract ablation was performed as the microwave ablation probe was removed. Hemostasis was achieved with manual compression. A limited postprocedural scan was performed. A dressing was placed. COMPLICATIONS: None immediate. FINDINGS: Ultrasound demonstrated moderate abdominal ascites. Paracentesis performed as above, removing 7.2 L clear yellow ascites. Under intermittent CT guidance, a percutaneous microwave ablation probes inserted into the mass. Real-time reformatted images generated during the procedure to ensure adequate treatment coverage. Post ablation imaging demonstrates an adequate ablation zone and was negative for obvious complication, specifically, no significant hemorrhage about the ablation site. IMPRESSION: 1. Successful CT guided percutaneous thermal ablation of right hepatic lesion. The patient will be observed overnight. Initial follow-up will be performed in approximately 4 weeks. 2. Technically successful CT-guided core biopsy, right hepatic  lesion. 3. Ultrasound-guided paracentesis removing  7.2 L. Electronically Signed   By: Lucrezia Europe M.D.   On: 03/08/2021 14:00   CT Paracentesis  Result Date: 03/08/2021 INDICATION: Right hepatic lobe LI-RADS 5 probable hepatocellular carcinoma. EXAM: CT-GUIDED PERCUTANEOUS THERMAL ABLATION OF LIVER LESION CT-GUIDED BIOPSY LIVER LESION ULTRASOUND-GUIDED PARACENTESIS ANESTHESIA/SEDATION: General MEDICATIONS: mefoxin 2 g IV. The antibiotic was administered in an appropriate time interval prior to needle puncture of the skin. CONTRAST:  None required PROCEDURE: The procedure, risks, benefits, and alternatives were explained to the patient. Questions regarding the procedure were encouraged and answered. The patient understands and consents to the procedure. The patient was placed under general anesthesia. Initial ultrasound scanning demonstrates a moderate amount of ascites within the right lower abdominal quadrant. The right lower abdomen was prepped and draped in the usual sterile fashion. 1% lidocaine was used for local anesthesia. Following this, a 6 Fr Safe-T-Centesis catheter was introduced. An ultrasound image was saved for documentation purposes. The paracentesis  was performed. Patient was then positioned prone on the CT gantry. Initial unenhanced CT was performed in a LAO position to localize the right hepatic lobe lesion, corresponding with previous MR and CT studies. The procedure was planned. The patient was prepped with chlorhexidine in a sterile fashion, and a sterile drape was applied covering the operative field. A sterile gown and sterile gloves were used for the procedure. A 25 gauge needle was advanced in expected trajectory of the microwave ablation probe for procedural planning. Once the appropriate trajectory was established, a 17 gauge PR MWA percutaneous microwave ablation probe was advanced into the superior aspect right hepatic lesion under intermittent CT guidance. Subsequently, A 25 gauge  needle was advanced in expected trajectory of the microwave ablation probe for procedural planning. Once the appropriate trajectory was established, a second 17 gauge PR MWA percutaneous microwave ablation probe was advanced into the inferior aspect right hepatic lesion under intermittent CT guidance. Real time reformatted images were generated during the procedure to establish appropriate depth of the ablation probe and to insure adequate treatment coverage. Once appropriate positioning was confirmed, a 17 gauge trocar needle was advanced to the margin of the lesion is midportion, through which coaxial 18 gauge core biopsy needle passage X 3 performed, obtaining solid-appearing samples submitted in formalin to surgical pathology. Subsequently, 10-minute ablation was performed. Follow-up CT was obtained. Tract ablation was performed as the microwave ablation probe was removed. Hemostasis was achieved with manual compression. A limited postprocedural scan was performed. A dressing was placed. COMPLICATIONS: None immediate. FINDINGS: Ultrasound demonstrated moderate abdominal ascites. Paracentesis performed as above, removing 7.2 L clear yellow ascites. Under intermittent CT guidance, a percutaneous microwave ablation probes inserted into the mass. Real-time reformatted images generated during the procedure to ensure adequate treatment coverage. Post ablation imaging demonstrates an adequate ablation zone and was negative for obvious complication, specifically, no significant hemorrhage about the ablation site. IMPRESSION: 1. Successful CT guided percutaneous thermal ablation of right hepatic lesion. The patient will be observed overnight. Initial follow-up will be performed in approximately 4 weeks. 2. Technically successful CT-guided core biopsy, right hepatic lesion. 3. Ultrasound-guided paracentesis removing  7.2 L. Electronically Signed   By: Lucrezia Europe M.D.   On: 03/08/2021 14:00   IR Paracentesis  Result  Date: 03/22/2021 INDICATION: Patient with history of hepatocellular carcinoma with recurrent ascites. Request is for therapeutic and diagnostic paracentesis EXAM: ULTRASOUND GUIDED therapeutic and diagnostic PARACENTESIS MEDICATIONS: Lidocaine 1% 10 mL COMPLICATIONS: None immediate. PROCEDURE: Informed written consent was obtained from the patient after a discussion of the risks, benefits and alternatives to treatment. A timeout was performed prior to the initiation of the procedure. Initial ultrasound scanning demonstrates a large amount of ascites within the left lower abdominal quadrant. The left lower abdomen was prepped and draped in the usual sterile fashion. 1% lidocaine was used for local anesthesia. Following this, a 19 gauge, 7-cm, Yueh catheter was introduced. An ultrasound image was saved for documentation purposes. The paracentesis was performed. The catheter was removed and a dressing was applied. The patient tolerated the procedure well without immediate post procedural complication. Patient received post-procedure intravenous albumin; see nursing notes for details. FINDINGS: A total of approximately 5 L of straw-colored fluid was removed. Samples were sent to the laboratory as requested by the clinical team. IMPRESSION: Successful ultrasound-guided therapeutic and diagnostic paracentesis yielding 5 liters of peritoneal fluid. Read by: Rushie Nyhan, NP Electronically Signed   By: Sandi Mariscal M.D.   On: 03/22/2021 10:59  Labs:  CBC: Recent Labs    01/26/21 1128 02/02/21 1053 03/02/21 1045 03/21/21 1047  WBC 4.9 6.0 5.7 5.6  HGB 9.1* 9.4* 10.6* 10.6*  HCT 30.5* 31.5* 35.6* 35.0*  PLT 125* 119* 142* 130*    COAGS: Recent Labs    06/15/20 0923 06/30/20 1301 07/01/20 0032 03/02/21 1045  INR 1.3* 2.1* 1.6* 1.1    BMP: Recent Labs    01/12/21 1053 02/22/21 1456 03/02/21 1045 03/02/21 1125 03/08/21 0700 03/21/21 1047  NA 142   < > 142 139 141 141  K 4.7   < > 5.5*  4.8 5.1 5.0  CL 109   < > 112* 107 109 108  CO2 25   < > 24 25 23 25   GLUCOSE 97   < > 98 90 89 107*  BUN 24*   < > 41* 40* 41* 39*  CALCIUM 8.6*   < > 8.8* 8.6 8.8* 9.0  CREATININE 1.91*   < > 2.49* 2.46* 2.72* 2.46*  GFRNONAA 27*  --  19*  --  17* 19*   < > = values in this interval not displayed.    LIVER FUNCTION TESTS: Recent Labs    08/23/20 1107 09/21/20 1543 01/12/21 1053 03/02/21 1045  BILITOT 0.5 0.7 0.7 0.7  AST 27 25 19 25   ALT 10 11 10 13   ALKPHOS 95 113 116 113  PROT 5.9* 6.5 5.9* 5.9*  ALBUMIN 3.0* 3.2* 3.1* 2.7*    TUMOR MARKERS: Recent Labs    01/12/21 1053  CEA 3.77    Assessment and Plan:  My impression is that the patient has done moderately well post ablation of her right hepatic hepatocellular carcinoma.  The post procedure nausea is unexpected and nonspecific.  I did call in a prescription for Compazine to her home pharmacy, and recommended that she follow-up with her PCP should that prove inadequate for symptom control.  We did discuss the need for continued surveillance of the liver to ensure complete ablation of the lesion.  We will plan to get the first post ablation follow-up in about 3 to 4 months MR without and with contrast.  She knows to call in the interval with any questions or problems related to the procedure. Thank you for this interesting consult.  I greatly enjoyed meeting Cheryl Jordan and look forward to participating in their care.  A copy of this report was sent to the requesting provider on this date.  Electronically Signed: Rickard Rhymes 03/28/2021, 12:13 PM   I spent a total of    25 Minutes in remote  clinical consultation, greater than 50% of which was counseling/coordinating care for hepatocellular carcinoma, post ablation.    Visit type: Audio only (telephone). Audio (no video) only due to patient's lack of internet/smartphone capability. Alternative for in-person consultation at [facility], [address]  [place], [place], [place]. This visit type was conducted due to national recommendations for restrictions regarding the COVID-19 Pandemic (e.g. social distancing).  This format is felt to be most appropriate for this patient at this time.  All issues noted in this document were discussed and addressed.

## 2021-04-04 ENCOUNTER — Telehealth: Payer: Self-pay | Admitting: Internal Medicine

## 2021-04-04 ENCOUNTER — Other Ambulatory Visit: Payer: Self-pay

## 2021-04-04 DIAGNOSIS — K7460 Unspecified cirrhosis of liver: Secondary | ICD-10-CM

## 2021-04-04 NOTE — Telephone Encounter (Signed)
Pt states that she has gained 8 or more pounds, her clothes are fitting tighter, she is having some SOB. Pt is requesting a paracentesis. Please Advise

## 2021-04-04 NOTE — Telephone Encounter (Signed)
Inbound call from pt requesting a call back stating that she needs to make an appt to get fluid off her stomach. Please advise. Thank you.

## 2021-04-04 NOTE — Telephone Encounter (Signed)
Unfortunately, her ascites is becoming more difficult to manage with time. She has renal insufficiency for which I would like her to see a nephrologist (I believe we requested this previously). Please see if she has an appointment yet. Get BMET.  Ultrasound guided Paracentesis up to 8 L. IV albumin replacement per protocol. 8 grams IV for every liter removed. Send fluid for cell count with differential. Thanks

## 2021-04-05 ENCOUNTER — Other Ambulatory Visit: Payer: Self-pay

## 2021-04-05 NOTE — Telephone Encounter (Signed)
Pt was scheduled for a Paracentesis at [facility] 04/06/2021 at 9:00. Pt to arrive @ 8:45, orders for albumin entered , labs entered into Epic. Pt made aware of date and time. Pt verbalized understanding.

## 2021-04-06 ENCOUNTER — Telehealth: Payer: Self-pay

## 2021-04-06 ENCOUNTER — Ambulatory Visit (HOSPITAL_COMMUNITY)
Admission: RE | Admit: 2021-04-06 | Discharge: 2021-04-06 | Disposition: A | Discharge status: Home / Self Care | Payer: Medicare Other | Source: Ambulatory Visit | Attending: Internal Medicine | Admitting: Internal Medicine

## 2021-04-06 ENCOUNTER — Other Ambulatory Visit: Payer: Self-pay

## 2021-04-06 DIAGNOSIS — K7460 Unspecified cirrhosis of liver: Secondary | ICD-10-CM | POA: Insufficient documentation

## 2021-04-06 DIAGNOSIS — R188 Other ascites: Secondary | ICD-10-CM | POA: Diagnosis not present

## 2021-04-06 HISTORY — PX: IR PARACENTESIS: IMG2679

## 2021-04-06 LAB — BODY FLUID CELL COUNT WITH DIFFERENTIAL
Eos, Fluid: 0 %
Lymphs, Fluid: 81 %
Monocyte-Macrophage-Serous Fluid: 2 % — ABNORMAL LOW (ref 50–90)
Neutrophil Count, Fluid: 17 % (ref 0–25)
Total Nucleated Cell Count, Fluid: 253 cu mm (ref 0–1000)

## 2021-04-06 MED ORDER — LIDOCAINE HCL (PF) 1 % IJ SOLN
INTRAMUSCULAR | Status: DC | PRN
  Administered 2021-04-06: 10 mL

## 2021-04-06 MED ORDER — LIDOCAINE HCL 1 % IJ SOLN
INTRAMUSCULAR | Status: AC
  Filled 2021-04-06: qty 20

## 2021-04-06 NOTE — Telephone Encounter (Signed)
Thanks [name]!

## 2021-04-06 NOTE — Telephone Encounter (Signed)
Just FYI The pt stated that she has seen Dr. Royce Macadamia Nephrologist on 04/03/2021

## 2021-04-06 NOTE — Procedures (Signed)
Ultrasound-guided diagnostic and therapeutic paracentesis performed yielding 7.2 liters of hazy, yellow fluid. No immediate complications. A portion of the fluid was sent to the lab for cell count/diff. EBL none.

## 2021-04-08 LAB — PATHOLOGIST SMEAR REVIEW

## 2021-04-18 ENCOUNTER — Other Ambulatory Visit: Payer: Self-pay

## 2021-04-18 ENCOUNTER — Telehealth: Payer: Self-pay | Admitting: Internal Medicine

## 2021-04-18 ENCOUNTER — Inpatient Hospital Stay: Payer: Medicare Other

## 2021-04-18 ENCOUNTER — Inpatient Hospital Stay: Payer: Medicare Other | Attending: Oncology | Admitting: Oncology

## 2021-04-18 VITALS — BP 138/66 | HR 84 | Temp 98.1°F | Resp 20 | Ht 67.0 in | Wt 202.2 lb

## 2021-04-18 DIAGNOSIS — I82401 Acute embolism and thrombosis of unspecified deep veins of right lower extremity: Secondary | ICD-10-CM | POA: Diagnosis not present

## 2021-04-18 DIAGNOSIS — Z9049 Acquired absence of other specified parts of digestive tract: Secondary | ICD-10-CM | POA: Insufficient documentation

## 2021-04-18 DIAGNOSIS — I10 Essential (primary) hypertension: Secondary | ICD-10-CM | POA: Insufficient documentation

## 2021-04-18 DIAGNOSIS — D649 Anemia, unspecified: Secondary | ICD-10-CM

## 2021-04-18 DIAGNOSIS — M109 Gout, unspecified: Secondary | ICD-10-CM | POA: Insufficient documentation

## 2021-04-18 DIAGNOSIS — D509 Iron deficiency anemia, unspecified: Secondary | ICD-10-CM | POA: Insufficient documentation

## 2021-04-18 DIAGNOSIS — R5381 Other malaise: Secondary | ICD-10-CM | POA: Insufficient documentation

## 2021-04-18 DIAGNOSIS — C189 Malignant neoplasm of colon, unspecified: Secondary | ICD-10-CM

## 2021-04-18 DIAGNOSIS — E119 Type 2 diabetes mellitus without complications: Secondary | ICD-10-CM | POA: Insufficient documentation

## 2021-04-18 DIAGNOSIS — K7469 Other cirrhosis of liver: Secondary | ICD-10-CM | POA: Diagnosis not present

## 2021-04-18 DIAGNOSIS — Z79899 Other long term (current) drug therapy: Secondary | ICD-10-CM | POA: Diagnosis not present

## 2021-04-18 DIAGNOSIS — N2889 Other specified disorders of kidney and ureter: Secondary | ICD-10-CM | POA: Diagnosis not present

## 2021-04-18 DIAGNOSIS — C180 Malignant neoplasm of cecum: Secondary | ICD-10-CM | POA: Insufficient documentation

## 2021-04-18 DIAGNOSIS — R188 Other ascites: Secondary | ICD-10-CM

## 2021-04-18 LAB — CBC WITH DIFFERENTIAL (CANCER CENTER ONLY)
Abs Immature Granulocytes: 0.01 10*3/uL (ref 0.00–0.07)
Basophils Absolute: 0 10*3/uL (ref 0.0–0.1)
Basophils Relative: 1 %
Eosinophils Absolute: 0.1 10*3/uL (ref 0.0–0.5)
Eosinophils Relative: 2 %
HCT: 34.9 % — ABNORMAL LOW (ref 36.0–46.0)
Hemoglobin: 11 g/dL — ABNORMAL LOW (ref 12.0–15.0)
Immature Granulocytes: 0 %
Lymphocytes Relative: 30 %
Lymphs Abs: 1.3 10*3/uL (ref 0.7–4.0)
MCH: 29.3 pg (ref 26.0–34.0)
MCHC: 31.5 g/dL (ref 30.0–36.0)
MCV: 92.8 fL (ref 80.0–100.0)
Monocytes Absolute: 0.3 10*3/uL (ref 0.1–1.0)
Monocytes Relative: 7 %
Neutro Abs: 2.6 10*3/uL (ref 1.7–7.7)
Neutrophils Relative %: 60 %
Platelet Count: 122 10*3/uL — ABNORMAL LOW (ref 150–400)
RBC: 3.76 MIL/uL — ABNORMAL LOW (ref 3.87–5.11)
RDW: 17 % — ABNORMAL HIGH (ref 11.5–15.5)
WBC Count: 4.3 10*3/uL (ref 4.0–10.5)
nRBC: 0 % (ref 0.0–0.2)

## 2021-04-18 LAB — IRON AND TIBC
Iron: 57 ug/dL (ref 28–170)
Saturation Ratios: 22 % (ref 10.4–31.8)
TIBC: 265 ug/dL (ref 250–450)
UIBC: 208 ug/dL

## 2021-04-18 LAB — FERRITIN: Ferritin: 70 ng/mL (ref 11–307)

## 2021-04-18 NOTE — Progress Notes (Signed)
[place] OFFICE PROGRESS NOTE   Diagnosis: Anemia, cirrhosis, HCC  INTERVAL HISTORY:   Cheryl Jordan returns as scheduled.  She last underwent a paracentesis on 04/06/2021.  She reports feeling better for a few days after the paracentesis.  She complains of malaise.  She has intermittent nausea and postprandial abdominal pain.  She is scheduled to see Dr. Henrene Pastor on 05/11/2021.  No bleeding.  Objective:  Vital signs in last 24 hours:  Blood pressure 138/66, pulse 84, temperature 98.1 F (36.7 C), temperature source Oral, resp. rate 20, height _0  (1.702 m), weight 202 lb 3.2 oz (91.7 kg), SpO2 97 %.    Resp: Lungs clear bilaterally Cardio: Regular rate and rhythm GI: Distended with ascites, no hepatosplenomegaly Vascular: Trace lower leg edema bilaterally  Lab Results:  Lab Results  Component Value Date   WBC 4.3 04/18/2021   HGB 11.0 (L) 04/18/2021   HCT 34.9 (L) 04/18/2021   MCV 92.8 04/18/2021   PLT 122 (L) 04/18/2021   NEUTROABS 2.6 04/18/2021    CMP  Lab Results  Component Value Date   NA 141 03/21/2021   K 5.0 03/21/2021   CL 108 03/21/2021   CO2 25 03/21/2021   GLUCOSE 107 (H) 03/21/2021   BUN 39 (H) 03/21/2021   CREATININE 2.46 (H) 03/21/2021   CALCIUM 9.0 03/21/2021   PROT 5.9 (L) 03/02/2021   ALBUMIN 2.7 (L) 03/02/2021   AST 25 03/02/2021   ALT 13 03/02/2021   ALKPHOS 113 03/02/2021   BILITOT 0.7 03/02/2021   GFRNONAA 19 (L) 03/21/2021   GFRAA 59 (L) 11/17/2017    Lab Results  Component Value Date   CEA1 3.24 06/30/2020   CEA 3.77 01/12/2021     Medications: I have reviewed the patient's current medications.   Assessment/Plan: Stage IIIc (T4b,N2b) moderately differentiated adenocarcinoma the cecum, status post a right colectomy 02/10/2015, tumor invades through the serosa and into adjacent small bowel, lymphovascular and perineural invasion present, resection margins negative, 8 of 20 lymph nodes positive for metastatic  carcinoma. Loss of MLH1 and PMS2 expression Microsatellite instability-high CT chest 03/03/2015-negative for metastatic disease Cycle 1 adjuvant Xeloda 03/18/2015 Cycle 2 adjuvant Xeloda 04/09/2015 Cycle 3 adjuvant Xeloda 04/30/2015 Cycle 4 adjuvant Xeloda 05/21/2015 Cycle 5 adjuvant Xeloda 06/11/2015 (dose reduced due to desquamation of feet). Cycle 6 adjuvant Xeloda 07/02/2015 Cycle 7 adjuvant Xeloda 07/23/2015 Cycle 8 adjuvant Xeloda 08/13/2015 CTs chest, abdomen, and pelvis 02/06/2016-cirrhosis, heterogenous enhancement of the liver, small hypoenhancing lesions in the liver-? 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 Likely regenerative nodules in the liver; does not appear as [place] or metastases. Left renal mass very slow growing. CT scan recommended in one year) Colonoscopy 08/06/2016-multiple 5-15 mm very subtle sessile serrated adenomas found in the transverse colon and at the anastomosis. Next colonoscopy at a one-year interval. CT 12/23/2017- no evidence of recurrent colon cancer, cirrhotic liver with no dominant mass, left renal mass-cystic component mildly larger compared to 2017 Colonoscopy 06/17/2020-hyperplastic polyps and a tubular adenoma removed from the rectum and colon      2. Anemia, microcytic. 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 She is followed by Dr. Alyson Ingles, urology. Unchanged on CT 02/06/2016, unchanged on MRI 12/06/2016, cystic component slightly larger on CT 12/23/2017 Diabetes Hypertension History of mild hand/foot syndrome  secondary to Xeloda Enlarging segment 8 liver lesion noted on MRI 12/06/2016-felt to potentially represent a metastasis or HCC Ultrasound-guided biopsy of the lesion on 02/06/2017  revealed cirrhosis, no malignancy CT 12/23/2017- chronic liver nodularity, no dominant mass, changes of cirrhosis CT 04/29/2020- enlarging right liver lesion concerning for [facility], separate from the lesion biopsied in 2018 CT 12/13/2020- enlargement of right hepatic lesion, probable second more subtle lesion in the posterior right liver, progression of ascites with nodularity of the omentum-edema related? MRI liver 01/03/2021-2.9 x 2.6 cm segment 7 lesion, Li-RADS 5, second segment 6 lesion, 1.1 cm, hemangioma or septated cyst, cirrhosis, splenomegaly, small left pleural effusion-unchanged Biopsy and thermal ablation of a liver lesion 03/08/2021.  Pathology shows well to moderately differentiated hepatocellular carcinoma. Cirrhosis History of mild thrombocytopenia-likely secondary to cirrhosis 10. 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  Gout flare left great toe 02/02/2021-short course of prednisone      Disposition: Cheryl Jordan is stable from a hematologic standpoint.  There is no indication for transfusion today.  We will follow-up on the ferritin level from today.  She will return for an office visit and CBC in 2 months.  She will be scheduled for surveillance imaging for the [facility] in a few months.  I suspect the malaise and her nausea/abdominal pain are related to cirrhosis with ascites and varices.  She is scheduled see Dr. Henrene Pastor later this month.  She plans to contact Dr. Henrene Pastor to consider a change of her medical regimen and request a repeat paracentesis.  [name], MD  04/18/2021  11:42 AM

## 2021-04-18 NOTE — Telephone Encounter (Signed)
Patient called requesting to schedule for paracenthesis

## 2021-04-18 NOTE — Telephone Encounter (Signed)
US guided paracentesis up to 8 liters. IV albumin replacement. Send fluid for cell count with differential

## 2021-04-18 NOTE — Telephone Encounter (Signed)
Pt requesting to have an paracentesis done. Please advise.

## 2021-04-18 NOTE — Telephone Encounter (Signed)
Pt scheduled for IR para at [facility] 04/20/21 at 9:45am. Pt aware of appt.

## 2021-04-20 ENCOUNTER — Other Ambulatory Visit: Payer: Self-pay

## 2021-04-20 ENCOUNTER — Ambulatory Visit (HOSPITAL_COMMUNITY)
Admission: RE | Admit: 2021-04-20 | Discharge: 2021-04-20 | Disposition: A | Discharge status: Home / Self Care | Payer: Medicare Other | Source: Ambulatory Visit | Attending: Internal Medicine | Admitting: Internal Medicine

## 2021-04-20 DIAGNOSIS — R188 Other ascites: Secondary | ICD-10-CM | POA: Insufficient documentation

## 2021-04-20 HISTORY — PX: IR PARACENTESIS: IMG2679

## 2021-04-20 LAB — BODY FLUID CELL COUNT WITH DIFFERENTIAL
Eos, Fluid: 0 %
Lymphs, Fluid: 76 %
Monocyte-Macrophage-Serous Fluid: 13 % — ABNORMAL LOW (ref 50–90)
Neutrophil Count, Fluid: 11 % (ref 0–25)
Total Nucleated Cell Count, Fluid: 213 cu mm (ref 0–1000)

## 2021-04-20 MED ORDER — LIDOCAINE HCL 1 % IJ SOLN
INTRAMUSCULAR | Status: AC
  Filled 2021-04-20: qty 20

## 2021-04-20 MED ORDER — ALBUMIN HUMAN 25 % IV SOLN
12.5000 g | Freq: Once | INTRAVENOUS | Status: DC
  Filled 2021-04-20: qty 50

## 2021-04-20 MED ORDER — LIDOCAINE HCL (PF) 1 % IJ SOLN
INTRAMUSCULAR | Status: DC | PRN
  Administered 2021-04-20: 10 mL

## 2021-04-20 NOTE — Procedures (Signed)
PROCEDURE SUMMARY:  Successful US guided paracentesis from [facility].  Yielded 7.4 L of hazy, yellow fluid.  No immediate complications.  Pt tolerated well.   Specimen was sent for labs.  EBL < 55mL  Tyson Alias, NP 04/20/2021 12:08 PM

## 2021-04-24 LAB — PATHOLOGIST SMEAR REVIEW

## 2021-05-02 ENCOUNTER — Other Ambulatory Visit: Payer: Self-pay

## 2021-05-02 ENCOUNTER — Telehealth: Payer: Self-pay | Admitting: Internal Medicine

## 2021-05-02 DIAGNOSIS — R188 Other ascites: Secondary | ICD-10-CM

## 2021-05-02 NOTE — Telephone Encounter (Signed)
Pt scheduled for IR para at [facility] per Dr. Blanch Media orders 05/03/21@10am , pt to arrive there at 9:45am. Pt aware. [facility] for records, they are to be faxed to [phone] attn. Dr. Henrene Pastor.

## 2021-05-02 NOTE — Telephone Encounter (Signed)
Cheryl Jordan calling to schedule a paracentesis. Reports she is uncomfortable, clothes are fitting tighter and she has had some weight gain. Please advise.

## 2021-05-02 NOTE — Telephone Encounter (Signed)
Inbound call from patient states she need paracentesis

## 2021-05-02 NOTE — Telephone Encounter (Signed)
1.  Ultrasound-guided paracentesis up to 8 L 2.  IV albumin replacement per protocol 3.  Send fluid for cell count with differential 4.  I understand that she saw a nephrologist.  Please get me the most recent office note/evaluation from that neurologist for my review.  Her renal insufficiency is contributing to her difficult to manage refractory ascites.

## 2021-05-03 ENCOUNTER — Ambulatory Visit (HOSPITAL_COMMUNITY)
Admission: RE | Admit: 2021-05-03 | Discharge: 2021-05-03 | Disposition: A | Discharge status: Home / Self Care | Payer: Medicare Other | Source: Ambulatory Visit | Attending: Internal Medicine | Admitting: Internal Medicine

## 2021-05-03 ENCOUNTER — Other Ambulatory Visit: Payer: Self-pay

## 2021-05-03 DIAGNOSIS — R188 Other ascites: Secondary | ICD-10-CM | POA: Diagnosis present

## 2021-05-03 HISTORY — PX: IR PARACENTESIS: IMG2679

## 2021-05-03 LAB — BODY FLUID CELL COUNT WITH DIFFERENTIAL
Eos, Fluid: 0 %
Lymphs, Fluid: 63 %
Monocyte-Macrophage-Serous Fluid: 25 % — ABNORMAL LOW (ref 50–90)
Neutrophil Count, Fluid: 12 % (ref 0–25)
Total Nucleated Cell Count, Fluid: 185 cu mm (ref 0–1000)

## 2021-05-03 MED ORDER — LIDOCAINE HCL 1 % IJ SOLN
INTRAMUSCULAR | Status: AC
  Administered 2021-05-03: 10 mL
  Filled 2021-05-03: qty 20

## 2021-05-03 NOTE — Procedures (Signed)
PROCEDURE SUMMARY:  Successful US guided paracentesis from left lower quadrant Yielded 8.4 L of hazy, yellow fluid.  No immediate complications.  Pt tolerated well.   Specimen was sent for labs.  EBL < 86mL  Tyson Alias, NP 05/03/2021 10:47 AM

## 2021-05-03 NOTE — Telephone Encounter (Signed)
Office note from [facility] in Dr. Blanch Media box for review.

## 2021-05-05 LAB — PATHOLOGIST SMEAR REVIEW

## 2021-05-11 ENCOUNTER — Other Ambulatory Visit: Payer: Self-pay

## 2021-05-11 ENCOUNTER — Other Ambulatory Visit: Payer: Self-pay | Admitting: Interventional Radiology

## 2021-05-11 ENCOUNTER — Ambulatory Visit (INDEPENDENT_AMBULATORY_CARE_PROVIDER_SITE_OTHER): Payer: Medicare Other | Admitting: Internal Medicine

## 2021-05-11 ENCOUNTER — Encounter: Payer: Self-pay | Admitting: Internal Medicine

## 2021-05-11 VITALS — BP 124/66 | HR 75 | Ht 67.0 in | Wt 198.0 lb

## 2021-05-11 DIAGNOSIS — K219 Gastro-esophageal reflux disease without esophagitis: Secondary | ICD-10-CM | POA: Diagnosis not present

## 2021-05-11 DIAGNOSIS — K7460 Unspecified cirrhosis of liver: Secondary | ICD-10-CM

## 2021-05-11 DIAGNOSIS — R112 Nausea with vomiting, unspecified: Secondary | ICD-10-CM | POA: Diagnosis not present

## 2021-05-11 DIAGNOSIS — R188 Other ascites: Secondary | ICD-10-CM

## 2021-05-11 DIAGNOSIS — R160 Hepatomegaly, not elsewhere classified: Secondary | ICD-10-CM

## 2021-05-11 MED ORDER — ONDANSETRON HCL 4 MG PO TABS: ORAL_TABLET | ORAL | 6 refills | Status: DC

## 2021-05-11 NOTE — Patient Instructions (Signed)
If you are age 79 or older, your body mass index should be between 23-30. Your Body mass index is 31.01 kg/m. If this is out of the aforementioned range listed, please consider follow up with your Primary Care Provider.  If you are age 36 or younger, your body mass index should be between 19-25. Your Body mass index is 31.01 kg/m. If this is out of the aformentioned range listed, please consider follow up with your Primary Care Provider.   ________________________________________________________  The [place] [facility] providers would like to encourage you to use [facility] to communicate with providers for non-urgent requests or questions.  Due to long hold times on the telephone, sending your provider a message by [facility] may be a faster and more efficient way to get a response.  Please allow 48 business hours for a response.  Please remember that this is for non-urgent requests.  _______________________________________________________  We have sent the following medications to your pharmacy for you to pick up at your convenience:  Zofran  You have been scheduled for an abdominal paracentesis at [facility] radiology (1st floor of hospital) on 05/16/2021 at 1:00pm. Please arrive at least 30 minutes prior to your appointment time for registration. Should you need to reschedule this appointment for any reason, please call our office at [phone].  Please follow up in 3 months

## 2021-05-11 NOTE — Progress Notes (Signed)
HISTORY OF PRESENT ILLNESS:  Cheryl Jordan is a 80 y.o. female with  1.  NASH cirrhosis.  Complications include refractory ascites.  Intolerance to spironolactone (hyperkalemia).  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 2.  Renal insufficiency, now being followed by nephrology (Dr. Royce Macadamia) 3.  [place] right liver lesion.  Followed by oncology.  Last MRI January 03, 2021 with 2.9 x 2.6 hyperenhancing lesion of the right liver, segment 7.  Biopsy and thermal ablation of liver lesion March 08, 2021.  Pathology revealed well to moderately differentiated hepatocellular carcinoma. 4.  History of DVT.  No longer on chronic anticoagulation therapy 5.  Multifactorial anemia 6.  General medical problems include hypertension, diabetes. 7.  Multifactorial and iron deficiency anemia.  Recurrent.  Followed by hematology.  Recent IV iron infusion. 8.  History of colon cancer.  Colonoscopy December 2021 9.  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  I last saw the patient February 06, 2021.  See that dictation.  She continues with frequent (about every 2 weeks) large-volume paracentesis to manage refractory ascites.  She was evaluated by nephrology April 03, 2021 with Dr. Royce Macadamia.  I have reviewed that encounter.  Her telmisartan was discontinued.  There are hopes to resume spironolactone, but this is dependent upon her kidney function and electrolytes.  She did have follow-up with nephrology last week, but had to cancel as she was feeling poorly.  Her next appointment is November 14.  She also saw oncology, Dr. Benay Spice, April 18, 2021.  I have reviewed that encounter as well.  Her chief complaint is progressive malaise and poor appetite.  Nausea without vomiting.  She was evaluated at urgent care and prescribed Zofran.  She thinks this helps some.  She is on omeprazole 20 mg twice daily.  No problems with peripheral edema.  She does complain of recurrent symptomatic  ascites.  Her last paracentesis was 8 days ago.  She feels like she needs another paracentesis.  She lives at home with her husband.  She is understandably depressed about feeling poorly-progressively so.  Review of blood work from April 18, 2021 shows hemoglobin of 11.0 and platelet count 122,000.  Last basic metabolic panel March 21, 2021 with BUN 39 and creatinine 2.46.  Sodium 141  REVIEW OF SYSTEMS:  All non-GI ROS negative unless otherwise stated in the HPI except for anxiety/depression  Past Medical History:  Diagnosis Date   Allergy    Anemia    hx of   Anxiety    Aortic stenosis    mild by 04/08/20 echo   Arthritis    Colon cancer ([place]) dx'd 11/2014   Diabetes mellitus without complication (HCC)    TYPE II   Dyspnea    Fatty liver    Gallbladder disease    GERD (gastroesophageal reflux disease)    Heart murmur    History of gout    Hypertension    Hypothyroidism    Kidney disorder    spot on left kidney   Osteoporosis    PONV (postoperative nausea and vomiting)     Past Surgical History:  Procedure Laterality Date   BIOPSY  06/17/2020   Procedure: BIOPSY;  Surgeon: Irving Copas., MD;  Location: [facility];  Service: Gastroenterology;;   CATARACT EXTRACTION Left    CHOLECYSTECTOMY     COLONOSCOPY WITH PROPOFOL N/A 06/17/2020   Procedure: COLONOSCOPY WITH PROPOFOL;  Surgeon: Irving Copas., MD;  Location: [facility];  Service: Gastroenterology;  Laterality: N/A;   COLONOSCOPY WITH PROPOFOL N/A 07/01/2020   Procedure: COLONOSCOPY WITH PROPOFOL;  Surgeon: Mauri Pole, MD;  Location: [facility];  Service: Endoscopy;  Laterality: N/A;   ESOPHAGOGASTRODUODENOSCOPY (EGD) WITH PROPOFOL N/A 06/17/2020   Procedure: ESOPHAGOGASTRODUODENOSCOPY (EGD) WITH PROPOFOL;  Surgeon: Rush Landmark Telford Nab., MD;  Location: [facility];  Service: Gastroenterology;  Laterality: N/A;   ESOPHAGOGASTRODUODENOSCOPY (EGD) WITH PROPOFOL N/A 07/01/2020    Procedure: ESOPHAGOGASTRODUODENOSCOPY (EGD) WITH PROPOFOL;  Surgeon: Mauri Pole, MD;  Location: [facility];  Service: Endoscopy;  Laterality: N/A;   FOREIGN BODY REMOVAL  06/17/2020   Procedure: FOREIGN BODY REMOVAL;  Surgeon: Rush Landmark Telford Nab., MD;  Location: [facility];  Service: Gastroenterology;;   GI RADIOFREQUENCY ABLATION  06/17/2020   Procedure: GI RADIOFREQUENCY ABLATION;  Surgeon: Irving Copas., MD;  Location: [facility];  Service: Gastroenterology;;   HEMOSTASIS CLIP PLACEMENT  06/17/2020   Procedure: HEMOSTASIS CLIP PLACEMENT;  Surgeon: Irving Copas., MD;  Location: [facility];  Service: Gastroenterology;;   HEMOSTASIS CLIP PLACEMENT  07/01/2020   Procedure: HEMOSTASIS CLIP PLACEMENT;  Surgeon: Mauri Pole, MD;  Location: [facility];  Service: Endoscopy;;   HOT HEMOSTASIS N/A 07/01/2020   Procedure: HOT HEMOSTASIS (ARGON PLASMA COAGULATION/BICAP);  Surgeon: Mauri Pole, MD;  Location: [facility];  Service: Endoscopy;  Laterality: N/A;   IR PARACENTESIS  08/19/2020   IR PARACENTESIS  09/26/2020   IR PARACENTESIS  11/03/2020   IR PARACENTESIS  11/28/2020   IR PARACENTESIS  12/21/2020   IR PARACENTESIS  01/17/2021   IR PARACENTESIS  02/08/2021   IR PARACENTESIS  02/23/2021   IR PARACENTESIS  03/22/2021   IR PARACENTESIS  04/06/2021   IR PARACENTESIS  04/20/2021   IR PARACENTESIS  05/03/2021   IR RADIOLOGIST EVAL & MGMT  01/24/2017   IR RADIOLOGIST EVAL & MGMT  02/22/2021   IR RADIOLOGIST EVAL & MGMT  03/28/2021   KNEE CARTILAGE SURGERY Bilateral    NEPHRECTOMY Left 02/10/2015   Procedure: OPEN RETROPERINTONEAL EXPLORATION LEFT RENAL  CYST DECORTICATION X 5;  Surgeon: Cleon Gustin, MD;  Location: [facility];  Service: Urology;  Laterality: Left;   PARTIAL COLECTOMY N/A 02/10/2015   Procedure: OPEN RIGHT  COLECTOMY ;  Surgeon: [name], MD;  Location: [facility];  Service: General;  Laterality: N/A;   POLYPECTOMY   06/17/2020   Procedure: POLYPECTOMY;  Surgeon: Irving Copas., MD;  Location: [facility];  Service: Gastroenterology;;   RADIOLOGY WITH ANESTHESIA N/A 03/08/2021   Procedure: CT MICROWAVE ABLATION WITH ANESTHESIA;  Surgeon: Arne Cleveland, MD;  Location: [facility];  Service: Radiology;  Laterality: N/A;   TUBAL LIGATION      Social History ELMER BOUTELLE  reports that she has never smoked. She has never used smokeless tobacco. She reports that she does not drink alcohol and does not use drugs.  family history includes COPD in her brother, brother, and father; Diabetes in her mother; GER disease in her sister; Heart attack in her brother; Hypertension in her brother, sister, and another family member; Lung cancer in her brother.  Allergies  Allergen Reactions   Aspirin Other (See Comments)    Runny nose    Codeine Nausea And Vomiting       PHYSICAL EXAMINATION: Vital signs: BP 124/66   Pulse 75   Ht 5\' 7"  (1.702 m)   Wt 198 lb (89.8 kg)   BMI 31.01 kg/m   Constitutional: Pleasant, chronically ill-appearing, no acute distress Psychiatric: alert and oriented  x3, cooperative with depressed mood Eyes: extraocular movements intact, anicteric, conjunctiva pink Mouth: oral pharynx moist, no lesions Neck: supple no lymphadenopathy Cardiovascular: heart regular rate and rhythm, no murmur Lungs: clear to auscultation bilaterally with slight dullness at the bases Abdomen: soft, nontender, distended with obvious ascites, no peritoneal signs, normal bowel sounds, no organomegaly Rectal: Omitted Extremities: no clubbing or cyanosis.  Trace lower extremity edema bilaterally Skin: no lesions on visible extremities Neuro: No focal deficits. No asterixis.     ASSESSMENT:  1.  NASH cirrhosis.  Complications include refractory ascites.  Intolerance to spironolactone (hyperkalemia).  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   Ongoing problems with refractory ascites.  Significant anorexia and nausea without vomiting as described.  Fatigue, depression, and malaise.  All related to multiple medical problems. 2.  Renal insufficiency, now being followed by nephrology (Dr. Royce Macadamia) 3.  [place] right liver lesion.  Followed by oncology.  Last MRI January 03, 2021 with 2.9 x 2.6 hyperenhancing lesion of the right liver, segment 7.  Biopsy and thermal ablation of liver lesion March 08, 2021.  Pathology revealed well to moderately differentiated hepatocellular carcinoma. 4.  History of DVT.  No longer on chronic anticoagulation therapy 5.  Multifactorial anemia 6.  General medical problems include hypertension, diabetes. 7.  Multifactorial and iron deficiency anemia.  Recurrent.  Followed by hematology.  Recent IV iron infusion. 8.  History of colon cancer.  Colonoscopy December 2021 9.  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   PLAN:  1.  Prescribe Zofran 4 mg dissolving tablet.  Take 1 or 220 minutes before meals.  Medication effects and side effects reviewed. 2.  Set up large-volume paracentesis, up to 8 L, with IV albumin replacement-8 g/L removed. 3.  Send fluid for cell count with differential 4.  Keep nephrology appointment next week 5.  Ongoing oncologic care with Dr. Benay Spice 6.  Ongoing general medical care with PCP 7.  Routine GI office follow-up 3 months.  Contact the office in the interim for any questions or problems  A total time of 45 minutes was spent preparing to see the patient, reviewing test, laboratories, and x-rays.  Reviewing outside notes from other consultants.  Obtaining comprehensive history and performing medically appropriate physical examination.  Counseling the patient regarding her multiple above listed issues.  Ordering medications and therapeutic interventional radiology procedure.  Finally, documenting clinical information in the health record

## 2021-05-16 ENCOUNTER — Ambulatory Visit (HOSPITAL_COMMUNITY)
Admission: RE | Admit: 2021-05-16 | Discharge: 2021-05-16 | Disposition: A | Discharge status: Home / Self Care | Payer: Medicare Other | Source: Ambulatory Visit | Attending: Internal Medicine | Admitting: Internal Medicine

## 2021-05-16 ENCOUNTER — Other Ambulatory Visit: Payer: Self-pay

## 2021-05-16 DIAGNOSIS — K7460 Unspecified cirrhosis of liver: Secondary | ICD-10-CM

## 2021-05-16 DIAGNOSIS — R188 Other ascites: Secondary | ICD-10-CM | POA: Insufficient documentation

## 2021-05-16 HISTORY — PX: IR PARACENTESIS: IMG2679

## 2021-05-16 LAB — GRAM STAIN

## 2021-05-16 MED ORDER — LIDOCAINE HCL 1 % IJ SOLN
INTRAMUSCULAR | Status: DC | PRN
  Administered 2021-05-16: 10 mL

## 2021-05-16 MED ORDER — LIDOCAINE HCL 1 % IJ SOLN
INTRAMUSCULAR | Status: AC
  Filled 2021-05-16: qty 20

## 2021-05-16 MED ORDER — ALBUMIN HUMAN 25 % IV SOLN
INTRAVENOUS | Status: AC
  Filled 2021-05-16: qty 200

## 2021-05-16 MED ORDER — ALBUMIN HUMAN 25 % IV SOLN
50.0000 g | Freq: Once | INTRAVENOUS | Status: AC
  Administered 2021-05-16: 50 g via INTRAVENOUS
  Filled 2021-05-16: qty 200

## 2021-05-21 LAB — CULTURE, BODY FLUID W GRAM STAIN -BOTTLE: Culture: NO GROWTH

## 2021-05-25 ENCOUNTER — Other Ambulatory Visit: Payer: Self-pay

## 2021-05-25 ENCOUNTER — Telehealth: Payer: Self-pay | Admitting: Internal Medicine

## 2021-05-25 DIAGNOSIS — R188 Other ascites: Secondary | ICD-10-CM

## 2021-05-25 DIAGNOSIS — K7460 Unspecified cirrhosis of liver: Secondary | ICD-10-CM

## 2021-05-25 NOTE — Telephone Encounter (Signed)
Inbound call from patient states she is having fluid build up and would like to have a paracentesis scheduled.

## 2021-05-25 NOTE — Telephone Encounter (Signed)
Pt scheduled for IR para per Dr. Blanch Media orders at [facility] 05/29/21, pt to arrive there at 9:45am for a 10am appt. Pt aware of appt.

## 2021-05-25 NOTE — Telephone Encounter (Signed)
Pt calling to have a paracentesis scheduled. Please advise.

## 2021-05-25 NOTE — Telephone Encounter (Signed)
Okay for ultrasound-guided paracentesis up to 8 L.  Provide IV albumin replacement per protocol.  Send fluid for cell count with differential

## 2021-05-29 ENCOUNTER — Ambulatory Visit (HOSPITAL_COMMUNITY)
Admission: RE | Admit: 2021-05-29 | Discharge: 2021-05-29 | Disposition: A | Discharge status: Home / Self Care | Payer: Medicare Other | Source: Ambulatory Visit | Attending: Internal Medicine | Admitting: Internal Medicine

## 2021-05-29 ENCOUNTER — Other Ambulatory Visit: Payer: Self-pay

## 2021-05-29 DIAGNOSIS — K7460 Unspecified cirrhosis of liver: Secondary | ICD-10-CM | POA: Insufficient documentation

## 2021-05-29 DIAGNOSIS — R188 Other ascites: Secondary | ICD-10-CM | POA: Insufficient documentation

## 2021-05-29 HISTORY — PX: IR PARACENTESIS: IMG2679

## 2021-05-29 LAB — BODY FLUID CELL COUNT WITH DIFFERENTIAL
Eos, Fluid: 0 %
Lymphs, Fluid: 75 %
Monocyte-Macrophage-Serous Fluid: 21 % — ABNORMAL LOW (ref 50–90)
Neutrophil Count, Fluid: 4 % (ref 0–25)
Total Nucleated Cell Count, Fluid: 166 cu mm (ref 0–1000)

## 2021-05-29 MED ORDER — ALBUMIN HUMAN 25 % IV SOLN
50.0000 g | Freq: Once | INTRAVENOUS | Status: AC
  Filled 2021-05-29: qty 200

## 2021-05-29 MED ORDER — LIDOCAINE HCL 1 % IJ SOLN
INTRAMUSCULAR | Status: AC
  Administered 2021-05-29: 10 mL
  Filled 2021-05-29: qty 20

## 2021-05-29 MED ORDER — ALBUMIN HUMAN 25 % IV SOLN
INTRAVENOUS | Status: AC
  Administered 2021-05-29: 50 g via INTRAVENOUS
  Filled 2021-05-29: qty 200

## 2021-05-29 NOTE — Procedures (Signed)
PROCEDURE SUMMARY:  Successful US guided diagnostic and therapeutic paracentesis from [facility]. Yielded 8.4 L of clear, yellow fluid.  No immediate complications.  Pt tolerated well.   Specimen was sent for labs.  EBL < 1 mL  Tyson Alias, AGNP 05/29/2021 10:38 AM

## 2021-05-31 ENCOUNTER — Other Ambulatory Visit: Payer: Self-pay

## 2021-05-31 ENCOUNTER — Ambulatory Visit (HOSPITAL_COMMUNITY)
Admission: RE | Admit: 2021-05-31 | Discharge: 2021-05-31 | Disposition: A | Discharge status: Home / Self Care | Payer: Medicare Other | Source: Ambulatory Visit | Attending: Interventional Radiology | Admitting: Interventional Radiology

## 2021-05-31 DIAGNOSIS — R160 Hepatomegaly, not elsewhere classified: Secondary | ICD-10-CM | POA: Insufficient documentation

## 2021-05-31 MED ORDER — GADOBUTROL 1 MMOL/ML IV SOLN
9.0000 mL | Freq: Once | INTRAVENOUS | Status: AC | PRN
  Administered 2021-05-31: 9 mL via INTRAVENOUS

## 2021-06-02 ENCOUNTER — Telehealth: Payer: Self-pay | Admitting: Internal Medicine

## 2021-06-02 ENCOUNTER — Other Ambulatory Visit: Payer: Self-pay

## 2021-06-02 DIAGNOSIS — R188 Other ascites: Secondary | ICD-10-CM

## 2021-06-02 NOTE — Telephone Encounter (Signed)
Ultrasound-guided paracentesis up to 8 L.  IV albumin replacement per protocol.  Send fluid for cell count with differential.  Schedule no sooner than 1 week from her previous

## 2021-06-02 NOTE — Telephone Encounter (Signed)
[facility] scheduled [facility] per [facility]. Blanch Media orders, see below. Appt at [facility] 06/07/21 at 10am, pt to  arrive at 9:45am. Pt aware of appt.

## 2021-06-02 NOTE — Telephone Encounter (Signed)
Pt calling requesting paracentesis next week. Please advise.

## 2021-06-02 NOTE — Telephone Encounter (Signed)
Patient called requesting an appointment next week to have fluid drawn off her stomach.  She cannot do it on Tuesday, but any other day should work.  Please call and advise.

## 2021-06-04 LAB — PATHOLOGIST SMEAR REVIEW

## 2021-06-06 ENCOUNTER — Encounter: Payer: Self-pay | Admitting: *Deleted

## 2021-06-06 ENCOUNTER — Ambulatory Visit
Admission: RE | Admit: 2021-06-06 | Discharge: 2021-06-06 | Disposition: A | Discharge status: Home / Self Care | Payer: Medicare Other | Source: Ambulatory Visit | Attending: Interventional Radiology | Admitting: Interventional Radiology

## 2021-06-06 DIAGNOSIS — R160 Hepatomegaly, not elsewhere classified: Secondary | ICD-10-CM

## 2021-06-06 HISTORY — PX: IR RADIOLOGIST EVAL & MGMT: IMG5224

## 2021-06-07 ENCOUNTER — Ambulatory Visit (HOSPITAL_COMMUNITY)
Admission: RE | Admit: 2021-06-07 | Discharge: 2021-06-07 | Disposition: A | Discharge status: Home / Self Care | Payer: Medicare Other | Source: Ambulatory Visit | Attending: Internal Medicine | Admitting: Internal Medicine

## 2021-06-07 ENCOUNTER — Other Ambulatory Visit: Payer: Self-pay

## 2021-06-07 DIAGNOSIS — R188 Other ascites: Secondary | ICD-10-CM | POA: Insufficient documentation

## 2021-06-07 HISTORY — PX: IR PARACENTESIS: IMG2679

## 2021-06-07 LAB — BODY FLUID CELL COUNT WITH DIFFERENTIAL
Eos, Fluid: 0 %
Lymphs, Fluid: 58 %
Monocyte-Macrophage-Serous Fluid: 1 % — ABNORMAL LOW (ref 50–90)
Neutrophil Count, Fluid: 41 % — ABNORMAL HIGH (ref 0–25)
Total Nucleated Cell Count, Fluid: 167 cu mm (ref 0–1000)

## 2021-06-07 MED ORDER — ALBUMIN HUMAN 25 % IV SOLN
50.0000 g | Freq: Once | INTRAVENOUS | Status: AC
  Filled 2021-06-07: qty 200

## 2021-06-07 MED ORDER — LIDOCAINE HCL 1 % IJ SOLN
INTRAMUSCULAR | Status: AC
  Filled 2021-06-07: qty 20

## 2021-06-07 MED ORDER — ALBUMIN HUMAN 25 % IV SOLN
INTRAVENOUS | Status: AC
  Administered 2021-06-07: 50 g via INTRAVENOUS
  Filled 2021-06-07: qty 200

## 2021-06-07 NOTE — Procedures (Signed)
PROCEDURE SUMMARY:  Successful US guided diagnostic and therapeutic paracentesis from [facility].  Yielded 8 L of cloudy, yellow fluid.  No immediate complications.  Pt tolerated well.   Specimen was sent for labs.  EBL < 1 mL  Tyson Alias, AGNP 06/07/2021 12:07 PM

## 2021-06-09 LAB — PATHOLOGIST SMEAR REVIEW

## 2021-06-09 NOTE — Progress Notes (Signed)
Patient ID: Cheryl Jordan, female   DOB: 02/18/1941, 80 y.o.   MRN: [id number]       Chief Complaint: Patient was seen in consultation today for  follow-up liver microwave ablation at the request of Matthews,Kacie Sue-Ellen.     Referring Physician(s): Julieanne Manson, MD   History of Present Illness: Cheryl Jordan is a 80 y.o. 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 02/06/2016 CT demonstrated hepatic cirrhosis, left renal mass 12/06/2016 liver MR demonstrated enlarging segment 8 lesion 02/06/2017 ultrasound-guided liver biopsy negative for neoplasm 04/29/2020 CT showed enlargement of a separate right liver lesion 12/13/2020 CT showed enlargement of right hepatic lesion, and a second posterior right subtle lesion 01/03/2021 liver MR demonstrated 2.6 x 2.9 cm segment 7 LiRADS 5 lesion, 1.1 cm segment 6 benign-appearing lesion The patient  has also had recurrent symptomatic abdominal ascites requiring multiple paracentesis procedures; cytology has been negative for malignancy.     03/08/2021 patient underwent uncomplicated CT-guided microwave ablation of the right hepatic lesion, with core biopsy.Patient had some regional pain immediately post procedure which  resolved.  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 03/22/2021 therapeutic paracentesis removing 5 L 04/06/2021 diagnostic paracentesis removing 7.2 L 04/20/2021 therapeutic paracentesis removing 7.4 L 05/03/2021 paracentesis removing 8.4 L 05/29/2021 paracentesis removing 8.4 L  She is not having any more pain at the treatment site.  Her persistent complaint is nausea which is been quite debilitating, and limits her ability to eat although she is not really having any vomiting.    No fever or chills.  She had her follow-up MRI 05/31/2021 and presents for  review of that.    Past Medical History:  Diagnosis Date   Allergy    Anemia    hx of   Anxiety    Aortic stenosis    mild by 04/08/20 echo   Arthritis    Colon cancer ([facility]) dx'd 11/2014   Diabetes mellitus without complication (HCC)    TYPE II   Dyspnea    Fatty liver    Gallbladder disease    GERD (gastroesophageal reflux disease)    Heart murmur    History of gout    Hypertension    Hypothyroidism    Kidney disorder    spot on left kidney   Osteoporosis    PONV (postoperative nausea and vomiting)     Past Surgical History:  Procedure Laterality Date   BIOPSY  06/17/2020   Procedure: BIOPSY;  Surgeon: Irving Copas., MD;  Location: [facility];  Service: Gastroenterology;;   CATARACT EXTRACTION Left    CHOLECYSTECTOMY     COLONOSCOPY WITH PROPOFOL N/A 06/17/2020   Procedure: COLONOSCOPY WITH PROPOFOL;  Surgeon: Irving Copas., MD;  Location: [facility];  Service: Gastroenterology;  Laterality: N/A;   COLONOSCOPY WITH PROPOFOL N/A 07/01/2020   Procedure: COLONOSCOPY WITH PROPOFOL;  Surgeon: Mauri Pole, MD;  Location: [facility];  Service: Endoscopy;  Laterality: N/A;   ESOPHAGOGASTRODUODENOSCOPY (EGD) WITH PROPOFOL N/A 06/17/2020   Procedure: ESOPHAGOGASTRODUODENOSCOPY (EGD) WITH PROPOFOL;  Surgeon: Rush Landmark Telford Nab., MD;  Location: [facility];  Service: Gastroenterology;  Laterality: N/A;   ESOPHAGOGASTRODUODENOSCOPY (EGD) WITH PROPOFOL N/A 07/01/2020   Procedure: ESOPHAGOGASTRODUODENOSCOPY (EGD) WITH PROPOFOL;  Surgeon: Mauri Pole, MD;  Location: [facility];  Service: Endoscopy;  Laterality: N/A;   FOREIGN BODY REMOVAL  06/17/2020  Procedure: FOREIGN BODY REMOVAL;  Surgeon: Rush Landmark Telford Nab., MD;  Location: [facility];  Service: Gastroenterology;;   GI RADIOFREQUENCY ABLATION  06/17/2020   Procedure: GI RADIOFREQUENCY ABLATION;  Surgeon: Irving Copas., MD;  Location: [facility];  Service:  Gastroenterology;;   HEMOSTASIS CLIP PLACEMENT  06/17/2020   Procedure: HEMOSTASIS CLIP PLACEMENT;  Surgeon: Irving Copas., MD;  Location: [facility];  Service: Gastroenterology;;   HEMOSTASIS CLIP PLACEMENT  07/01/2020   Procedure: HEMOSTASIS CLIP PLACEMENT;  Surgeon: Mauri Pole, MD;  Location: [facility];  Service: Endoscopy;;   HOT HEMOSTASIS N/A 07/01/2020   Procedure: HOT HEMOSTASIS (ARGON PLASMA COAGULATION/BICAP);  Surgeon: Mauri Pole, MD;  Location: [facility];  Service: Endoscopy;  Laterality: N/A;   IR PARACENTESIS  08/19/2020   IR PARACENTESIS  09/26/2020   IR PARACENTESIS  11/03/2020   IR PARACENTESIS  11/28/2020   IR PARACENTESIS  12/21/2020   IR PARACENTESIS  01/17/2021   IR PARACENTESIS  02/08/2021   IR PARACENTESIS  02/23/2021   IR PARACENTESIS  03/22/2021   IR PARACENTESIS  04/06/2021   IR PARACENTESIS  04/20/2021   IR PARACENTESIS  05/03/2021   IR PARACENTESIS  05/16/2021   IR PARACENTESIS  05/29/2021   IR PARACENTESIS  06/07/2021   IR RADIOLOGIST EVAL & MGMT  01/24/2017   IR RADIOLOGIST EVAL & MGMT  02/22/2021   IR RADIOLOGIST EVAL & MGMT  03/28/2021   IR RADIOLOGIST EVAL & MGMT  06/06/2021   KNEE CARTILAGE SURGERY Bilateral    NEPHRECTOMY Left 02/10/2015   Procedure: OPEN RETROPERINTONEAL EXPLORATION LEFT RENAL  CYST DECORTICATION X 5;  Surgeon: Cleon Gustin, MD;  Location: [facility];  Service: Urology;  Laterality: Left;   PARTIAL COLECTOMY N/A 02/10/2015   Procedure: OPEN RIGHT  COLECTOMY ;  Surgeon: [name], MD;  Location: [facility];  Service: General;  Laterality: N/A;   POLYPECTOMY  06/17/2020   Procedure: POLYPECTOMY;  Surgeon: Irving Copas., MD;  Location: [facility];  Service: Gastroenterology;;   RADIOLOGY WITH ANESTHESIA N/A 03/08/2021   Procedure: CT MICROWAVE ABLATION WITH ANESTHESIA;  Surgeon: Arne Cleveland, MD;  Location: [facility];  Service: Radiology;  Laterality: N/A;   TUBAL LIGATION       Allergies: Aspirin and Codeine  Medications: Prior to Admission medications   Medication Sig Start Date End Date Taking? Authorizing Provider  ACCU-CHEK AVIVA PLUS test strip  10/20/18   [provider]  Acetaminophen (ACETAMINOPHEN EXTRA STRENGTH) 500 MG capsule     [provider]  acetaminophen (TYLENOL) 500 MG tablet Take 500 mg by mouth every 6 (six) hours as needed for moderate pain.    [provider]  amLODipine (NORVASC) 2.5 MG tablet Take 1 tablet (2.5 mg total) by mouth daily. 12/04/19   Brunetta Jeans, PA-C  Blood Glucose Calibration (ACCU-CHEK AVIVA) SOLN     [provider]  Blood Glucose Monitoring Suppl (ACCU-CHEK AVIVA) device Use as instructed 07/05/20 07/05/21  Eugenie Filler, MD  dapagliflozin propanediol (FARXIGA) 10 MG TABS tablet  09/01/20   [provider]  ferrous sulfate 325 (65 FE) MG tablet Take 325 mg by mouth daily with breakfast.    [provider]  furosemide (LASIX) 40 MG tablet Take 1.5 tablets (60 mg total) by mouth daily. Patient taking differently: Take 40 mg by mouth daily. 09/21/20   [name], MD  glipiZIDE (GLUCOTROL XL) 5 MG 24 hr tablet Take 5 mg by mouth daily with breakfast.  [provider]  levothyroxine (SYNTHROID) 88 MCG tablet Take 1 tablet (88 mcg total) by mouth daily. 12/04/19   Brunetta Jeans, PA-C  loperamide (IMODIUM) 2 MG capsule Take 2 mg by mouth as needed for diarrhea or loose stools.    [provider]  LORazepam (ATIVAN) 0.5 MG tablet Take 0.5 mg by mouth as needed for anxiety.    [provider]  omeprazole (PRILOSEC) 20 MG capsule Take 1 capsule (20 mg total) by mouth 2 (two) times daily before a meal. 08/10/20   Brunetta Jeans, PA-C  ondansetron [facility]) 4 MG tablet Take 1-2 tablets 20 minutes before meals 05/11/21   [name], MD  propranolol (INDERAL) 40 MG tablet Take 20 mg by mouth 2 (two) times daily. 09/30/20   [provider]  Simethicone (GAS-X PO) Take 1 tablet by mouth as needed (flatulence).    [provider]  sucralfate (CARAFATE) 1 GM/10ML suspension     [provider]  rivaroxaban (XARELTO) 20 MG TABS tablet Take 1 tablet (20 mg total) by mouth daily with supper. Patient not taking: Reported on 08/04/2020 07/08/20 08/10/20  Eugenie Filler, MD     Family History  Problem Relation Age of Onset   Diabetes Mother    Hypertension Other    COPD Father    Hypertension Sister    GER disease Sister    Heart attack Brother    COPD Brother    Lung cancer Brother    COPD Brother    Hypertension Brother     Social History   Socioeconomic History   Marital status: Married    Spouse name: Not on file   Number of children: 2   Years of education: Not on file   Highest education level: Not on file  Occupational History   Occupation: Retired  Tobacco Use   Smoking status: Never   Smokeless tobacco: Never  Vaping Use   Vaping Use: Never used  Substance and Sexual Activity   Alcohol use: No   Drug use: No   Sexual activity: Never  Other Topics Concern   Not on file  Social History Narrative   03/03/15-Married, husband Jeneen Rinks   #2 grown sons and #2 grand daughters   Social Determinants of Health   Financial Resource Strain: Not on file  Food Insecurity: Not on file  Transportation Needs: Not on file  Physical Activity: Not on file  Stress: Not on file  Social Connections: Not on file    ECOG Status: 2 - Symptomatic, <50% confined to bed  Review of Systems: A 12 point ROS discussed and pertinent positives are indicated in the HPI above.  All other systems are negative.  Review of Systems  Vital Signs: There were no vitals taken for this visit.  Physical Exam Constitutional: Oriented to person, place, and time. Well-developed and well-nourished. No distress.   HENT:  Head: Normocephalic and atraumatic.  Eyes: Conjunctivae and EOM are normal. Right  eye exhibits no discharge. Left eye exhibits no discharge. No scleral icterus.  Neck: No JVD present.  Pulmonary/Chest: Effort normal. No stridor. No respiratory distress.  Abdomen: soft, non distended Neurological:  alert and oriented to person, place, and time.  Skin: Skin is warm and dry.  not diaphoretic.  Psychiatric:   normal mood and affect.   behavior is normal. Judgment and thought content normal.  Review of Systems: A 12 point ROS discussed and pertinent positives are indicated in the HPI above.  All other systems are negative.  Review of Systems  Vital Signs: There were no vitals taken for this visit.     Imaging: MR ABDOMEN WWO CONTRAST  Result Date: 06/01/2021 CLINICAL DATA:  Cirrhosis. Follow-up hepatocellular carcinoma. Approximately 3 months status post percutaneous thermal ablation. EXAM: MRI ABDOMEN WITHOUT AND WITH CONTRAST TECHNIQUE: Multiplanar multisequence MR imaging of the abdomen was performed both before and after the administration of intravenous contrast. CONTRAST:  72mL GADAVIST GADOBUTROL 1 MMOL/ML IV SOLN COMPARISON:  01/03/2021 FINDINGS: Lower chest: Increased size of small bilateral pleural effusions noted. Hepatobiliary: Hepatic cirrhosis again demonstrated. Percutaneous ablation defect is no seen in segment 7 of the superior right hepatic lobe, and there is no evidence of residual enhancing mass at this site. A 1.1 cm lesion is again seen in segment 7 of the posterior right hepatic lobe (image 28/16). This shows T2 hyperintensity and mild peripheral rim enhancement, without evidence of contrast washout. This remains stable since previous study. No new or enlarging liver masses are identified. Prior cholecystectomy. No evidence of biliary ductal dilatation. Pancreas:  No mass or inflammatory changes. Spleen:  Stable mild splenomegaly.  No splenic masses identified. Adrenals/Urinary Tract: Bilateral renal cysts are again noted. No masses identified. No evidence of  hydronephrosis. Stomach/Bowel: Visualized portion unremarkable. Vascular/Lymphatic: No pathologically enlarged lymph nodes identified. No acute vascular findings. Portosystemic venous collaterals in the left upper quadrant, consistent with portal venous hypertension. Other:  Moderate to large amount of ascites. Musculoskeletal:  No suspicious bone lesions identified. IMPRESSION: Percutaneous ablation defect the superior right hepatic lobe, without evidence of residual or recurrent tumor at this site. Stable 1.1 cm indeterminate lesion in the posterior right hepatic lobe. Recommend continued attention on follow-up MRI in 1 year. (LI-RADS Category 3: Intermediate probability of malignancy). Hepatic cirrhosis, with findings of portal venous hypertension. Moderate to large amount of ascites. Electronically Signed   By: Marlaine Hind M.D.   On: 06/01/2021 12:59   IR Radiologist Eval & Mgmt  Result Date: 06/06/2021 Please refer to notes tab for details about interventional procedure. (Op Note)  IR Paracentesis  Result Date: 06/07/2021 INDICATION: Patient with history of hepatocellular carcinoma and NASH cirrhosis with recurrent ascites. Request for therapeutic and diagnostic paracentesis with a maximum of 8 L. EXAM: ULTRASOUND GUIDED DIAGNOSTIC AND THERAPEUTIC PARACENTESIS MEDICATIONS: 10ml 1% lidocaine COMPLICATIONS: None immediate. PROCEDURE: Informed written consent was obtained from the patient after a discussion of the risks, benefits and alternatives to treatment. A timeout was performed prior to the initiation of the procedure. Initial ultrasound scanning demonstrates a large amount of ascites within the left lower abdominal quadrant. The left lower abdomen was prepped and draped in the usual sterile fashion. 1% lidocaine was used for local anesthesia. Following this, a 19 gauge, 7-cm, Yueh catheter was introduced. An ultrasound image was saved for documentation purposes. The paracentesis was performed.  The catheter was removed and a dressing was applied. The patient tolerated the procedure well without immediate post procedural complication. Patient received post-procedure intravenous albumin; see nursing notes for details. FINDINGS: A total of approximately 8 L of clear, yellow fluid was removed. Samples were sent to the laboratory as requested by the clinical team. IMPRESSION: Successful ultrasound-guided paracentesis yielding 8 liters of peritoneal fluid. Read by: Narda Rutherford, NP Electronically Signed   By: Corrie Mckusick D.O.   On: 06/07/2021 13:51   IR Paracentesis  Result Date: 05/29/2021 INDICATION: Patient with history of hepatocellular carcinoma and NASH cirrhosis with recurrent ascites. Request is for therapeutic  and diagnostic paracentesis maximum 8 L. EXAM: ULTRASOUND GUIDED DIAGNOSTIC AND THERAPEUTIC PARACENTESIS MEDICATIONS: 37ml 1% lidocaine COMPLICATIONS: None immediate. PROCEDURE: Informed written consent was obtained from the patient after a discussion of the risks, benefits and alternatives to treatment. A timeout was performed prior to the initiation of the procedure. Initial ultrasound scanning demonstrates a large amount of ascites within the left lower abdominal quadrant. The left lower abdomen was prepped and draped in the usual sterile fashion. 1% lidocaine was used for local anesthesia. Following this, a 19 gauge, 7-cm, Yueh catheter was introduced. An ultrasound image was saved for documentation purposes. The paracentesis was performed. The catheter was removed and a dressing was applied. The patient tolerated the procedure well without immediate post procedural complication. Patient received post-procedure intravenous albumin; see nursing notes for details. FINDINGS: A total of approximately 8.4 L of clear, yellow fluid was removed. Samples were sent to the laboratory as requested by the clinical team. IMPRESSION: Successful ultrasound-guided paracentesis yielding 8.4 liters of  peritoneal fluid. Read by: Narda Rutherford, NP Electronically Signed   By: Albin Felling M.D.   On: 05/29/2021 12:54   IR Paracentesis  Result Date: 05/16/2021 INDICATION: Patient history of hepatocellular carcinoma and NASH cirrhosis with recurrent ascites. Request is for therapeutic and diagnostic paracentesis maximum 8 L. EXAM: ULTRASOUND GUIDED THERAPEUTIC AND DIAGNOSTIC PARACENTESIS MEDICATIONS: Lidocaine 1% 10 mL COMPLICATIONS: None immediate. PROCEDURE: Informed written consent was obtained from the patient after a discussion of the risks, benefits and alternatives to treatment. A timeout was performed prior to the initiation of the procedure. Initial ultrasound scanning demonstrates a large amount of ascites within the left lower abdominal quadrant. The left lower abdomen was prepped and draped in the usual sterile fashion. 1% lidocaine was used for local anesthesia. Following this, a 8 Fr Safe-T-Centesis catheter was introduced. An ultrasound image was saved for documentation purposes. The paracentesis was performed. The catheter was removed and a dressing was applied. The patient tolerated the procedure well without immediate post procedural complication. Patient received post-procedure intravenous albumin; see nursing notes for details. FINDINGS: A total of approximately 8 L of hazy yellow fluid was removed. Samples were sent to the laboratory as requested by the clinical team. IMPRESSION: Successful ultrasound-guided therapeutic and diagnostic paracentesis yielding 8 liters of peritoneal fluid. Read by: Rushie Nyhan, NP Electronically Signed   By: Corrie Mckusick D.O.   On: 05/16/2021 17:10    Labs:  CBC: Recent Labs    02/02/21 1053 03/02/21 1045 03/21/21 1047 04/18/21 1106  WBC 6.0 5.7 5.6 4.3  HGB 9.4* 10.6* 10.6* 11.0*  HCT 31.5* 35.6* 35.0* 34.9*  PLT 119* 142* 130* 122*    COAGS: Recent Labs    06/15/20 0923 06/30/20 1301 07/01/20 0032 03/02/21 1045  INR 1.3* 2.1* 1.6*  1.1    BMP: Recent Labs    01/12/21 1053 02/22/21 1456 03/02/21 1045 03/02/21 1125 03/08/21 0700 03/21/21 1047  NA 142   < > 142 139 141 141  K 4.7   < > 5.5* 4.8 5.1 5.0  CL 109   < > 112* 107 109 108  CO2 25   < > 24 25 23 25   GLUCOSE 97   < > 98 90 89 107*  BUN 24*   < > 41* 40* 41* 39*  CALCIUM 8.6*   < > 8.8* 8.6 8.8* 9.0  CREATININE 1.91*   < > 2.49* 2.46* 2.72* 2.46*  GFRNONAA 27*  --  19*  --  17* 19*   < > =  values in this interval not displayed.    LIVER FUNCTION TESTS: Recent Labs    08/23/20 1107 09/21/20 1543 01/12/21 1053 03/02/21 1045  BILITOT 0.5 0.7 0.7 0.7  AST 27 25 19 25   ALT 10 11 10 13   ALKPHOS 95 113 116 113  PROT 5.9* 6.5 5.9* 5.9*  ALBUMIN 3.0* 3.2* 3.1* 2.7*    TUMOR MARKERS: Recent Labs    01/12/21 1053  CEA 3.77    Assessment and Plan:  My impression is that this pleasant patient has done well post microwave ablation of her hepatocellular carcinoma.  Follow-up MR shows no residual or recurrent enhancing lesion.  No apparent complications related to the procedure.  Her main complaint is the nausea which is probably related to her recurrent large volume abdominal ascites requiring frequent paracentesis procedures.  Her liver function is reasonably good so she could be a candidate for TIPS to help control the ascites.  I reviewed the imaging with the patient.  We will plan to get up for 30-month follow-up MRI, and continue surveillance imaging until stability x5 years has confirmed no residual recurrent disease.  Thank you for this interesting consult.  I greatly enjoyed meeting Cheryl Jordan and look forward to participating in their care.  A copy of this report was sent to the requesting provider on this date.  Electronically Signed: Rickard Rhymes 06/09/2021, 9:57 AM   I spent a total of    25 Minutes in face to face in clinical consultation, greater than 50% of which was counseling/coordinating care for hepatocellular  carcinoma, post ablation.

## 2021-06-13 ENCOUNTER — Inpatient Hospital Stay: Payer: Medicare Other | Attending: Oncology

## 2021-06-13 ENCOUNTER — Other Ambulatory Visit: Payer: Self-pay

## 2021-06-13 ENCOUNTER — Inpatient Hospital Stay (HOSPITAL_BASED_OUTPATIENT_CLINIC_OR_DEPARTMENT_OTHER): Payer: Medicare Other | Admitting: Nurse Practitioner

## 2021-06-13 ENCOUNTER — Encounter: Payer: Self-pay | Admitting: Nurse Practitioner

## 2021-06-13 VITALS — BP 139/57 | HR 93 | Temp 98.7°F | Resp 20 | Ht 67.0 in | Wt 191.6 lb

## 2021-06-13 DIAGNOSIS — I10 Essential (primary) hypertension: Secondary | ICD-10-CM | POA: Diagnosis not present

## 2021-06-13 DIAGNOSIS — R188 Other ascites: Secondary | ICD-10-CM | POA: Insufficient documentation

## 2021-06-13 DIAGNOSIS — D509 Iron deficiency anemia, unspecified: Secondary | ICD-10-CM | POA: Insufficient documentation

## 2021-06-13 DIAGNOSIS — N2889 Other specified disorders of kidney and ureter: Secondary | ICD-10-CM | POA: Insufficient documentation

## 2021-06-13 DIAGNOSIS — K766 Portal hypertension: Secondary | ICD-10-CM | POA: Diagnosis not present

## 2021-06-13 DIAGNOSIS — K7469 Other cirrhosis of liver: Secondary | ICD-10-CM | POA: Diagnosis not present

## 2021-06-13 DIAGNOSIS — D649 Anemia, unspecified: Secondary | ICD-10-CM

## 2021-06-13 DIAGNOSIS — Z79899 Other long term (current) drug therapy: Secondary | ICD-10-CM | POA: Insufficient documentation

## 2021-06-13 DIAGNOSIS — M109 Gout, unspecified: Secondary | ICD-10-CM | POA: Diagnosis not present

## 2021-06-13 DIAGNOSIS — C189 Malignant neoplasm of colon, unspecified: Secondary | ICD-10-CM | POA: Diagnosis not present

## 2021-06-13 DIAGNOSIS — C220 Liver cell carcinoma: Secondary | ICD-10-CM | POA: Diagnosis present

## 2021-06-13 DIAGNOSIS — Z9049 Acquired absence of other specified parts of digestive tract: Secondary | ICD-10-CM | POA: Insufficient documentation

## 2021-06-13 DIAGNOSIS — C180 Malignant neoplasm of cecum: Secondary | ICD-10-CM | POA: Diagnosis not present

## 2021-06-13 DIAGNOSIS — E119 Type 2 diabetes mellitus without complications: Secondary | ICD-10-CM | POA: Diagnosis not present

## 2021-06-13 LAB — CBC WITH DIFFERENTIAL (CANCER CENTER ONLY)
Abs Immature Granulocytes: 0.04 10*3/uL (ref 0.00–0.07)
Basophils Absolute: 0 10*3/uL (ref 0.0–0.1)
Basophils Relative: 0 %
Eosinophils Absolute: 0 10*3/uL (ref 0.0–0.5)
Eosinophils Relative: 0 %
HCT: 34.2 % — ABNORMAL LOW (ref 36.0–46.0)
Hemoglobin: 10.7 g/dL — ABNORMAL LOW (ref 12.0–15.0)
Immature Granulocytes: 0 %
Lymphocytes Relative: 8 %
Lymphs Abs: 0.8 10*3/uL (ref 0.7–4.0)
MCH: 29.1 pg (ref 26.0–34.0)
MCHC: 31.3 g/dL (ref 30.0–36.0)
MCV: 92.9 fL (ref 80.0–100.0)
Monocytes Absolute: 0.6 10*3/uL (ref 0.1–1.0)
Monocytes Relative: 6 %
Neutro Abs: 8.5 10*3/uL — ABNORMAL HIGH (ref 1.7–7.7)
Neutrophils Relative %: 86 %
Platelet Count: 118 10*3/uL — ABNORMAL LOW (ref 150–400)
RBC: 3.68 MIL/uL — ABNORMAL LOW (ref 3.87–5.11)
RDW: 15.7 % — ABNORMAL HIGH (ref 11.5–15.5)
WBC Count: 10 10*3/uL (ref 4.0–10.5)
nRBC: 0 % (ref 0.0–0.2)

## 2021-06-13 LAB — IRON AND TIBC
Iron: 35 ug/dL (ref 28–170)
Saturation Ratios: 17 % (ref 10.4–31.8)
TIBC: 200 ug/dL — ABNORMAL LOW (ref 250–450)
UIBC: 165 ug/dL

## 2021-06-13 LAB — FERRITIN: Ferritin: 110 ng/mL (ref 11–307)

## 2021-06-13 LAB — SAMPLE TO BLOOD BANK

## 2021-06-13 MED ORDER — PREDNISONE 10 MG PO TABS: 10.0000 mg | ORAL_TABLET | Freq: Every day | ORAL | 1 refills | Status: DC

## 2021-06-13 NOTE — Progress Notes (Signed)
[facility] OFFICE PROGRESS NOTE   Diagnosis: Anemia, cirrhosis, HCC  INTERVAL HISTORY:   Cheryl Jordan returns as scheduled.  She underwent a paracentesis procedure 06/07/2021.  She feels better for several days after each paracentesis procedure.  She specifically notes improvement in the nausea she experiences on a daily basis.  In the past when she has taken steroids she has noted improvement in nausea as well as overall sense of wellbeing.  She is not aware of any bleeding.  Objective:  Vital signs in last 24 hours:  Blood pressure (!) 139/57, pulse 93, temperature 98.7 F (37.1 C), temperature source Oral, resp. rate 20, height _0  (1.702 m), weight 191 lb 9.6 oz (86.9 kg), SpO2 98 %.   She was examined in the chair in the exam room.  She did not feel she could maneuver onto the exam table. Resp: Lungs clear bilaterally. Cardio: Regular rate and rhythm. GI: Abdomen markedly distended consistent with ascites. Vascular: No significant leg edema.   Lab Results:  Lab Results  Component Value Date   WBC 10.0 06/13/2021   HGB 10.7 (L) 06/13/2021   HCT 34.2 (L) 06/13/2021   MCV 92.9 06/13/2021   PLT 118 (L) 06/13/2021   NEUTROABS 8.5 (H) 06/13/2021    Imaging:  No results found.  Medications: I have reviewed the patient's current medications.  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 Loss of MLH1 and PMS2 expression Microsatellite instability-high CT chest 03/03/2015-negative for metastatic disease Cycle 1 adjuvant Xeloda 03/18/2015 Cycle 2 adjuvant Xeloda 04/09/2015 Cycle 3 adjuvant Xeloda 04/30/2015 Cycle 4 adjuvant Xeloda 05/21/2015 Cycle 5 adjuvant Xeloda 06/11/2015  (dose reduced due to desquamation of feet). Cycle 6 adjuvant Xeloda 07/02/2015 Cycle 7 adjuvant Xeloda 07/23/2015 Cycle 8 adjuvant Xeloda 08/13/2015 CTs chest, abdomen, and pelvis 02/06/2016-cirrhosis, heterogenous enhancement of the liver, small hypoenhancing lesions in the liver-? 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 Likely regenerative nodules in the liver; does not appear as [place] or metastases. Left renal mass very slow growing. CT scan recommended in one year) Colonoscopy 08/06/2016-multiple 5-15 mm very subtle sessile serrated adenomas found in the transverse colon and at the anastomosis. Next colonoscopy at a one-year interval. CT 12/23/2017- no evidence of recurrent colon cancer, cirrhotic liver with no dominant mass, left renal mass-cystic component mildly larger compared to 2017 Colonoscopy 06/17/2020-hyperplastic polyps and a tubular adenoma removed from the rectum and colon      2. Anemia, microcytic. 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 She is followed by Dr. Alyson Ingles, urology. Unchanged on CT 02/06/2016, unchanged on MRI 12/06/2016, cystic component slightly larger on CT 12/23/2017 Diabetes Hypertension History of mild hand/foot syndrome secondary to Xeloda Enlarging segment 8 liver lesion noted on MRI 12/06/2016-felt to potentially represent a metastasis or HCC Ultrasound-guided biopsy of the lesion on 02/06/2017 revealed cirrhosis, no malignancy CT 12/23/2017- chronic liver nodularity, no dominant mass, changes of cirrhosis CT 04/29/2020- enlarging right liver lesion concerning for [facility], separate from the lesion biopsied in 2018 CT 12/13/2020- enlargement of right hepatic lesion, probable second more subtle lesion in  the posterior right  liver, progression of ascites with nodularity of the omentum-edema related? MRI liver 01/03/2021-2.9 x 2.6 cm segment 7 lesion, Li-RADS 5, second segment 6 lesion, 1.1 cm, hemangioma or septated cyst, cirrhosis, splenomegaly, small left pleural effusion-unchanged Biopsy and thermal ablation of a liver lesion 03/08/2021.  Pathology shows well to moderately differentiated hepatocellular carcinoma. MRI abdomen 05/31/2021-percutaneous ablation defect superior right hepatic lobe without evidence of residual or recurrent tumor.  Stable 1.1 cm indeterminate lesion posterior right hepatic lobe.  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  Moderate to large amount of ascites. Cirrhosis History of mild thrombocytopenia-likely secondary to cirrhosis 10. 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  Gout flare left great toe 02/02/2021-short course of prednisone  Disposition: [name] remains stable from a hematologic standpoint.  We will follow-up on the ferritin from today.  Plan for repeat labs in 2 months.  The recent MRI for follow-up of [place] showed no evidence of residual or recurrent tumor and a lesion in the posterior right hepatic lobe was stable.  Her main complaints seem to be related to refractory ascites.  Dr. Adron Bene note from 06/06/2021 indicates she may be a candidate for TIPS to help control the ascites.  We will defer this to Dr. Henrene Pastor.  For the nausea she would like to try prednisone which has helped in the past.  We reviewed potential side effects associated with steroids.  She agrees to proceed.  She will return for lab and follow-up in 2 months.  We are available to see her sooner if needed.  Patient seen with Dr. Benay Spice.    Ned Card ANP/GNP-BC   06/13/2021  1:25 PM  This was a shared visit with Ned Card.   Ms. Pulse is symptomatic with constant nausea.  She reports relief of nausea for several days after paracentesis procedure.  She also obtained relief in the past with prednisone.  She will begin a trial of low-dose prednisone.  We will asked Dr. Henrene Pastor whether she would benefit from a TIPS procedure.  She will discontinue iron as this could be contributing to the nausea.  I was present for greater than 50% of today's visit.  I performed medical decision making.  Julieanne Manson, MD

## 2021-06-14 ENCOUNTER — Other Ambulatory Visit: Payer: Self-pay

## 2021-06-14 ENCOUNTER — Telehealth: Payer: Self-pay | Admitting: Internal Medicine

## 2021-06-14 DIAGNOSIS — R188 Other ascites: Secondary | ICD-10-CM

## 2021-06-14 NOTE — Telephone Encounter (Signed)
Ultrasound-guided paracentesis up to 8 L.  Provide IV albumin replacement per protocol.  Send fluid for cell count with differential.  She needs nephrology follow-up soon if she has not had such in the past couple of weeks.  Thanks

## 2021-06-14 NOTE — Telephone Encounter (Signed)
Pt scheduled for IR para at [facility] 06/15/21 @10am , pt to arrive there at 9:30am. Pt knows she needs to follow-up with nephrology.

## 2021-06-14 NOTE — Telephone Encounter (Signed)
Pt calling requesting another paracentesis. She had 8 liters removed on 11/23. Reports she has had more fluid accumulate and is uncomfortable.Please advise.

## 2021-06-14 NOTE — Telephone Encounter (Signed)
Inbound call from patient states she is having a fluid build up and would like to be schedule a paracentesis

## 2021-06-15 ENCOUNTER — Other Ambulatory Visit: Payer: Self-pay

## 2021-06-15 ENCOUNTER — Ambulatory Visit (HOSPITAL_COMMUNITY)
Admission: RE | Admit: 2021-06-15 | Discharge: 2021-06-15 | Disposition: A | Discharge status: Home / Self Care | Payer: Medicare Other | Source: Ambulatory Visit | Attending: Internal Medicine | Admitting: Internal Medicine

## 2021-06-15 DIAGNOSIS — R188 Other ascites: Secondary | ICD-10-CM | POA: Diagnosis present

## 2021-06-15 HISTORY — PX: IR PARACENTESIS: IMG2679

## 2021-06-15 LAB — BODY FLUID CELL COUNT WITH DIFFERENTIAL
Lymphs, Fluid: 75 %
Monocyte-Macrophage-Serous Fluid: 19 % — ABNORMAL LOW (ref 50–90)
Neutrophil Count, Fluid: 6 % (ref 0–25)
Total Nucleated Cell Count, Fluid: 375 cu mm (ref 0–1000)

## 2021-06-15 MED ORDER — ALBUMIN HUMAN 25 % IV SOLN: 12.5000 g | Freq: Once | INTRAVENOUS | Status: DC

## 2021-06-15 MED ORDER — LIDOCAINE HCL 1 % IJ SOLN
INTRAMUSCULAR | Status: AC
  Filled 2021-06-15: qty 20

## 2021-06-15 MED ORDER — ALBUMIN HUMAN 25 % IV SOLN
50.0000 g | Freq: Once | INTRAVENOUS | Status: AC
  Administered 2021-06-15: 50 g via INTRAVENOUS
  Filled 2021-06-15: qty 200

## 2021-06-15 NOTE — Procedures (Signed)
PROCEDURE SUMMARY:  Successful US guided paracentesis from right lateral abdomen.  Yielded 6.9 liters of clear yellow fluid.  No immediate complications.  Patient tolerated well.  EBL = trace  Specimen was sent for labs.  Kaicee Scarpino S Angell Pincock PA-C 06/15/2021 12:19 PM

## 2021-06-19 LAB — PATHOLOGIST SMEAR REVIEW: Path Review: INCREASED

## 2021-06-22 ENCOUNTER — Telehealth: Payer: Self-pay | Admitting: Internal Medicine

## 2021-06-22 ENCOUNTER — Other Ambulatory Visit: Payer: Self-pay

## 2021-06-22 DIAGNOSIS — R188 Other ascites: Secondary | ICD-10-CM

## 2021-06-22 NOTE — Telephone Encounter (Signed)
Okay to set up ultrasound-guided paracentesis. Up to 8 L to be removed IV albumin replacement per protocol (8 g IV for every liter removed) Send fluid for cell count with differential

## 2021-06-22 NOTE — Telephone Encounter (Signed)
Patient called this morning requesting you set up an appointment for her to "have fluid drawn off her tummy."  Please call patient and advise.  Thank you.

## 2021-06-22 NOTE — Telephone Encounter (Signed)
See note below. Please advise.  

## 2021-06-22 NOTE — Telephone Encounter (Signed)
Orders placed for IR paracentesis. Scheduled patient for an IR paracentesis at [facility] [place] on 06/28/21 at 10 am. Called patient to let her know date and time of paracentesis. Pt verbalized understanding.

## 2021-06-28 ENCOUNTER — Other Ambulatory Visit: Payer: Self-pay

## 2021-06-28 ENCOUNTER — Ambulatory Visit (HOSPITAL_COMMUNITY)
Admission: RE | Admit: 2021-06-28 | Discharge: 2021-06-28 | Disposition: A | Discharge status: Home / Self Care | Payer: Medicare Other | Source: Ambulatory Visit | Attending: Internal Medicine | Admitting: Internal Medicine

## 2021-06-28 DIAGNOSIS — R188 Other ascites: Secondary | ICD-10-CM

## 2021-06-28 HISTORY — PX: IR PARACENTESIS: IMG2679

## 2021-06-28 LAB — BODY FLUID CELL COUNT WITH DIFFERENTIAL
Eos, Fluid: 0 %
Lymphs, Fluid: 47 %
Monocyte-Macrophage-Serous Fluid: 26 % — ABNORMAL LOW (ref 50–90)
Neutrophil Count, Fluid: 27 % — ABNORMAL HIGH (ref 0–25)
Total Nucleated Cell Count, Fluid: 148 cu mm (ref 0–1000)

## 2021-06-28 MED ORDER — ALBUMIN HUMAN 25 % IV SOLN
50.0000 g | Freq: Once | INTRAVENOUS | Status: AC
  Administered 2021-06-28: 10:00:00 50 g via INTRAVENOUS

## 2021-06-28 MED ORDER — ALBUMIN HUMAN 25 % IV SOLN
INTRAVENOUS | Status: AC
  Filled 2021-06-28: qty 200

## 2021-06-28 MED ORDER — LIDOCAINE HCL 1 % IJ SOLN
INTRAMUSCULAR | Status: AC
  Administered 2021-06-28: 10:00:00 10 mL
  Filled 2021-06-28: qty 20

## 2021-06-28 NOTE — Procedures (Signed)
PROCEDURE SUMMARY:  Successful image-guided paracentesis from the right lower abdomen.  Yielded 8 liters of hazy yellow fluid.  No immediate complications.  EBL = trace. Patient tolerated well.   Specimen was sent for labs.  Please see imaging section of Epic for full dictation.   Armando Gang Hussien Greenblatt PA-C 06/28/2021 10:24 AM

## 2021-06-30 LAB — PATHOLOGIST SMEAR REVIEW

## 2021-07-03 ENCOUNTER — Telehealth: Payer: Self-pay | Admitting: Internal Medicine

## 2021-07-03 ENCOUNTER — Other Ambulatory Visit: Payer: Self-pay

## 2021-07-03 DIAGNOSIS — R188 Other ascites: Secondary | ICD-10-CM

## 2021-07-03 NOTE — Telephone Encounter (Signed)
Per usual, Ultrasound-guided paracentesis up to 8 L. IV albumin replacement Send fluid for cell count with differential

## 2021-07-03 NOTE — Telephone Encounter (Signed)
Patient called would like to schedule paracentesis prior to the weekend.

## 2021-07-03 NOTE — Telephone Encounter (Signed)
Pt scheduled for IR para at [facility] 07/05/21@9am , pt to arrive there at 8:30am. Pt to receive albumin 8gm IV for each liter of fluid removed. Orders per Dr. Henrene Pastor. Pt aware of appt.

## 2021-07-03 NOTE — Telephone Encounter (Signed)
Pt calling requesting a paracentesis prior to the weekend, please advise.

## 2021-07-05 ENCOUNTER — Other Ambulatory Visit: Payer: Self-pay

## 2021-07-05 ENCOUNTER — Ambulatory Visit (HOSPITAL_COMMUNITY)
Admission: RE | Admit: 2021-07-05 | Discharge: 2021-07-05 | Disposition: A | Discharge status: Home / Self Care | Payer: Medicare Other | Source: Ambulatory Visit | Attending: Internal Medicine | Admitting: Internal Medicine

## 2021-07-05 DIAGNOSIS — R188 Other ascites: Secondary | ICD-10-CM

## 2021-07-05 HISTORY — PX: IR PARACENTESIS: IMG2679

## 2021-07-05 LAB — BODY FLUID CELL COUNT WITH DIFFERENTIAL
Eos, Fluid: 0 %
Lymphs, Fluid: 75 %
Monocyte-Macrophage-Serous Fluid: 8 % — ABNORMAL LOW (ref 50–90)
Neutrophil Count, Fluid: 17 % (ref 0–25)
Total Nucleated Cell Count, Fluid: 195 cu mm (ref 0–1000)

## 2021-07-05 MED ORDER — ALBUMIN HUMAN 25 % IV SOLN
INTRAVENOUS | Status: AC
  Filled 2021-07-05: qty 200

## 2021-07-05 MED ORDER — LIDOCAINE HCL 1 % IJ SOLN
INTRAMUSCULAR | Status: AC
  Administered 2021-07-05: 09:00:00 10 mL
  Filled 2021-07-05: qty 20

## 2021-07-05 MED ORDER — ALBUMIN HUMAN 25 % IV SOLN
50.0000 g | Freq: Once | INTRAVENOUS | Status: AC
  Administered 2021-07-05: 10:00:00 50 g via INTRAVENOUS
  Filled 2021-07-05: qty 200

## 2021-07-05 NOTE — Procedures (Signed)
PROCEDURE SUMMARY:  Successful image-guided paracentesis from the right lower abdomen.  Yielded 7.4 liters of hazy yellow fluid.  No immediate complications.  EBL <  1 mL Patient tolerated well.   Specimen was sent for labs.  Please see imaging section of Epic for full dictation.  Joaquim Nam PA-C 07/05/2021 9:21 AM

## 2021-07-06 LAB — PATHOLOGIST SMEAR REVIEW

## 2021-07-11 ENCOUNTER — Other Ambulatory Visit: Payer: Self-pay

## 2021-07-11 ENCOUNTER — Telehealth: Payer: Self-pay | Admitting: Internal Medicine

## 2021-07-11 DIAGNOSIS — R188 Other ascites: Secondary | ICD-10-CM

## 2021-07-11 DIAGNOSIS — K7460 Unspecified cirrhosis of liver: Secondary | ICD-10-CM

## 2021-07-11 MED ORDER — ALBUMIN HUMAN 25 % IV SOLN: 12.5000 g | Freq: Once | INTRAVENOUS | Status: DC

## 2021-07-11 NOTE — Telephone Encounter (Signed)
Inbound call from patient requesting to schedule paracentesis prior to the weekend, Friday if possible.  Please advise.

## 2021-07-11 NOTE — Telephone Encounter (Signed)
Chart reviewed.  Was last seen by Dr. Henrene Pastor on 05/11/2021. History of NASH cirrhosis with refractory ascites requiring serial large-volume therapeutic paracentesis every 2 weeks or so historically, but increasing frequency more recently as outlined.  She has tolerated paracentesis without issue per review of chart.    -Ok to set up repeat ultrasound-guided paracentesis with IR. -Up to 8 L to be removed -IV albumin replacement per protocol (8 g IV for every liter removed) -Send fluid for cell count with differential -Very reasonable to set up as a standing order

## 2021-07-12 ENCOUNTER — Other Ambulatory Visit: Payer: Self-pay

## 2021-07-12 DIAGNOSIS — R188 Other ascites: Secondary | ICD-10-CM

## 2021-07-12 DIAGNOSIS — K7460 Unspecified cirrhosis of liver: Secondary | ICD-10-CM

## 2021-07-12 NOTE — Telephone Encounter (Signed)
Called pt and advised her paracentesis is scheduled at [facility] on 07/14/21 @ 930am. Verbalized acceptance and understanding. Amb referral placed for auth purposes. Orders placed for paracentesis as ordered by Dr. Henrene Pastor and Dr. Bryan Lemma.

## 2021-07-12 NOTE — Telephone Encounter (Signed)
Standing orders for routine paracentesis w/ IV albumin q 10-14 days placed. Pt aware to self schedule future visits with IR. Routing this message to Rosanne Sack, RN for continuity of care purposes.

## 2021-07-14 ENCOUNTER — Ambulatory Visit (HOSPITAL_COMMUNITY)
Admission: RE | Admit: 2021-07-14 | Discharge: 2021-07-14 | Disposition: A | Discharge status: Home / Self Care | Payer: Medicare Other | Source: Ambulatory Visit | Attending: Gastroenterology | Admitting: Gastroenterology

## 2021-07-14 ENCOUNTER — Other Ambulatory Visit: Payer: Self-pay

## 2021-07-14 DIAGNOSIS — R188 Other ascites: Secondary | ICD-10-CM | POA: Insufficient documentation

## 2021-07-14 DIAGNOSIS — K7460 Unspecified cirrhosis of liver: Secondary | ICD-10-CM | POA: Diagnosis not present

## 2021-07-14 HISTORY — PX: IR PARACENTESIS: IMG2679

## 2021-07-14 LAB — BODY FLUID CELL COUNT WITH DIFFERENTIAL
Lymphs, Fluid: 69 %
Monocyte-Macrophage-Serous Fluid: 22 % — ABNORMAL LOW (ref 50–90)
Neutrophil Count, Fluid: 9 % (ref 0–25)
Total Nucleated Cell Count, Fluid: 207 cu mm (ref 0–1000)

## 2021-07-14 MED ORDER — LIDOCAINE HCL 1 % IJ SOLN
INTRAMUSCULAR | Status: AC
  Filled 2021-07-14: qty 20

## 2021-07-14 MED ORDER — ALBUMIN HUMAN 25 % IV SOLN: 50.0000 g | Freq: Once | INTRAVENOUS | Status: AC

## 2021-07-14 MED ORDER — ALBUMIN HUMAN 25 % IV SOLN
INTRAVENOUS | Status: AC
  Administered 2021-07-14: 11:00:00 50 g via INTRAVENOUS
  Filled 2021-07-14: qty 200

## 2021-07-14 MED ORDER — LIDOCAINE HCL 1 % IJ SOLN
INTRAMUSCULAR | Status: DC | PRN
  Administered 2021-07-14: 5 mL via INTRADERMAL

## 2021-07-14 NOTE — Procedures (Addendum)
PROCEDURE SUMMARY:  Successful US guided paracentesis from LLQ.  Yielded 7.4L of cloudy yellow fluid.  No immediate complications.  Pt tolerated well.   Specimen was sent for labs.  EBL < 77mL  Owynn Mosqueda PA-C 07/14/2021 12:44 PM

## 2021-07-18 ENCOUNTER — Telehealth: Payer: Self-pay | Admitting: General Surgery

## 2021-07-18 NOTE — Telephone Encounter (Signed)
Left a message that the ascites showed no SBP.

## 2021-07-18 NOTE — Telephone Encounter (Signed)
-----   Message from Fletcher, DO sent at 07/14/2021  2:44 PM EST ----- Ascites fluid negative for SBP.

## 2021-07-19 LAB — PATHOLOGIST SMEAR REVIEW

## 2021-07-21 ENCOUNTER — Ambulatory Visit (HOSPITAL_COMMUNITY)
Admission: RE | Admit: 2021-07-21 | Discharge: 2021-07-21 | Disposition: A | Discharge status: Home / Self Care | Payer: Medicare Other | Source: Ambulatory Visit | Attending: Internal Medicine | Admitting: Internal Medicine

## 2021-07-21 ENCOUNTER — Other Ambulatory Visit: Payer: Self-pay

## 2021-07-21 DIAGNOSIS — R188 Other ascites: Secondary | ICD-10-CM | POA: Diagnosis not present

## 2021-07-21 HISTORY — PX: IR PARACENTESIS: IMG2679

## 2021-07-21 LAB — BODY FLUID CELL COUNT WITH DIFFERENTIAL
Eos, Fluid: 0 %
Lymphs, Fluid: 77 %
Monocyte-Macrophage-Serous Fluid: 8 % — ABNORMAL LOW (ref 50–90)
Neutrophil Count, Fluid: 15 % (ref 0–25)
Total Nucleated Cell Count, Fluid: 299 cu mm (ref 0–1000)

## 2021-07-21 MED ORDER — LIDOCAINE HCL 1 % IJ SOLN
INTRAMUSCULAR | Status: AC
  Filled 2021-07-21: qty 20

## 2021-07-21 MED ORDER — LIDOCAINE HCL (PF) 1 % IJ SOLN
INTRAMUSCULAR | Status: DC | PRN
  Administered 2021-07-21: 10 mL

## 2021-07-21 MED ORDER — ALBUMIN HUMAN 25 % IV SOLN: 57.6000 g | Freq: Once | INTRAVENOUS | Status: AC

## 2021-07-21 MED ORDER — ALBUMIN HUMAN 25 % IV SOLN
INTRAVENOUS | Status: AC
  Administered 2021-07-21: 56.25 g via INTRAVENOUS
  Filled 2021-07-21: qty 300

## 2021-07-21 MED ORDER — ALBUMIN HUMAN 25 % IV SOLN: 12.5000 g | Freq: Once | INTRAVENOUS | Status: DC

## 2021-07-21 NOTE — Procedures (Signed)
PROCEDURE SUMMARY:  Successful US guided paracentesis from left lateral abdomen.  Yielded 7.2 liters of cloudy yellow fluid.  No immediate complications.  Patient tolerated well.  EBL = trace  Specimen was sent for labs.  Shanitha Twining S Ezri Landers PA-C 07/21/2021 3:13 PM

## 2021-07-25 LAB — PATHOLOGIST SMEAR REVIEW

## 2021-07-26 ENCOUNTER — Encounter (HOSPITAL_COMMUNITY): Payer: Self-pay | Admitting: Physician Assistant

## 2021-07-28 ENCOUNTER — Other Ambulatory Visit (HOSPITAL_COMMUNITY): Payer: Self-pay | Admitting: Internal Medicine

## 2021-07-28 DIAGNOSIS — R188 Other ascites: Secondary | ICD-10-CM

## 2021-08-01 ENCOUNTER — Ambulatory Visit (HOSPITAL_COMMUNITY)
Admission: RE | Admit: 2021-08-01 | Discharge: 2021-08-01 | Disposition: A | Discharge status: Home / Self Care | Payer: Medicare Other | Source: Ambulatory Visit | Attending: Internal Medicine | Admitting: Internal Medicine

## 2021-08-01 ENCOUNTER — Other Ambulatory Visit: Payer: Self-pay

## 2021-08-01 DIAGNOSIS — R188 Other ascites: Secondary | ICD-10-CM | POA: Diagnosis present

## 2021-08-01 HISTORY — PX: IR PARACENTESIS: IMG2679

## 2021-08-01 MED ORDER — ALBUMIN HUMAN 25 % IV SOLN
INTRAVENOUS | Status: AC
  Filled 2021-08-01: qty 200

## 2021-08-01 MED ORDER — LIDOCAINE HCL 1 % IJ SOLN
INTRAMUSCULAR | Status: AC
  Filled 2021-08-01: qty 20

## 2021-08-01 MED ORDER — ALBUMIN HUMAN 25 % IV SOLN
50.0000 g | Freq: Once | INTRAVENOUS | Status: AC
Start: 2021-08-01 — End: 2021-08-01
  Administered 2021-08-01: 50 g via INTRAVENOUS
  Filled 2021-08-01: qty 200

## 2021-08-01 NOTE — Procedures (Signed)
PROCEDURE SUMMARY:  Successful US guided paracentesis from right abdomen.  Yielded 7.2 L of clear yellow fluid.  No immediate complications.  Pt tolerated well.  EBL < 2 mL  Theresa Duty, NP 08/01/2021 10:39 AM

## 2021-08-07 ENCOUNTER — Emergency Department (HOSPITAL_COMMUNITY): Payer: Medicare Other

## 2021-08-07 ENCOUNTER — Telehealth: Payer: Self-pay | Admitting: *Deleted

## 2021-08-07 ENCOUNTER — Encounter (HOSPITAL_COMMUNITY): Payer: Self-pay

## 2021-08-07 ENCOUNTER — Inpatient Hospital Stay: Payer: Medicare Other

## 2021-08-07 ENCOUNTER — Inpatient Hospital Stay (HOSPITAL_COMMUNITY)
Admission: EM | Admit: 2021-08-07 | Discharge: 2021-08-11 | DRG: 682 | Disposition: A | Discharge status: Home / Self Care | Payer: Medicare Other | Attending: Internal Medicine | Admitting: Internal Medicine

## 2021-08-07 ENCOUNTER — Inpatient Hospital Stay: Payer: Medicare Other | Admitting: Oncology

## 2021-08-07 DIAGNOSIS — Z79890 Hormone replacement therapy (postmenopausal): Secondary | ICD-10-CM

## 2021-08-07 DIAGNOSIS — N179 Acute kidney failure, unspecified: Secondary | ICD-10-CM | POA: Diagnosis present

## 2021-08-07 DIAGNOSIS — I350 Nonrheumatic aortic (valve) stenosis: Secondary | ICD-10-CM | POA: Diagnosis present

## 2021-08-07 DIAGNOSIS — Z8249 Family history of ischemic heart disease and other diseases of the circulatory system: Secondary | ICD-10-CM

## 2021-08-07 DIAGNOSIS — Z6829 Body mass index (BMI) 29.0-29.9, adult: Secondary | ICD-10-CM

## 2021-08-07 DIAGNOSIS — R112 Nausea with vomiting, unspecified: Secondary | ICD-10-CM | POA: Diagnosis present

## 2021-08-07 DIAGNOSIS — M810 Age-related osteoporosis without current pathological fracture: Secondary | ICD-10-CM | POA: Diagnosis present

## 2021-08-07 DIAGNOSIS — D500 Iron deficiency anemia secondary to blood loss (chronic): Secondary | ICD-10-CM | POA: Diagnosis present

## 2021-08-07 DIAGNOSIS — I152 Hypertension secondary to endocrine disorders: Secondary | ICD-10-CM | POA: Diagnosis present

## 2021-08-07 DIAGNOSIS — C220 Liver cell carcinoma: Secondary | ICD-10-CM | POA: Diagnosis present

## 2021-08-07 DIAGNOSIS — K219 Gastro-esophageal reflux disease without esophagitis: Secondary | ICD-10-CM | POA: Diagnosis present

## 2021-08-07 DIAGNOSIS — E1169 Type 2 diabetes mellitus with other specified complication: Secondary | ICD-10-CM

## 2021-08-07 DIAGNOSIS — E1122 Type 2 diabetes mellitus with diabetic chronic kidney disease: Secondary | ICD-10-CM | POA: Diagnosis present

## 2021-08-07 DIAGNOSIS — E039 Hypothyroidism, unspecified: Secondary | ICD-10-CM | POA: Diagnosis present

## 2021-08-07 DIAGNOSIS — E11649 Type 2 diabetes mellitus with hypoglycemia without coma: Secondary | ICD-10-CM | POA: Diagnosis present

## 2021-08-07 DIAGNOSIS — Z66 Do not resuscitate: Secondary | ICD-10-CM | POA: Diagnosis present

## 2021-08-07 DIAGNOSIS — R011 Cardiac murmur, unspecified: Secondary | ICD-10-CM | POA: Diagnosis not present

## 2021-08-07 DIAGNOSIS — Z20822 Contact with and (suspected) exposure to covid-19: Secondary | ICD-10-CM | POA: Diagnosis present

## 2021-08-07 DIAGNOSIS — N1832 Chronic kidney disease, stage 3b: Secondary | ICD-10-CM | POA: Diagnosis present

## 2021-08-07 DIAGNOSIS — N183 Chronic kidney disease, stage 3 unspecified: Secondary | ICD-10-CM | POA: Diagnosis present

## 2021-08-07 DIAGNOSIS — E860 Dehydration: Secondary | ICD-10-CM | POA: Diagnosis present

## 2021-08-07 DIAGNOSIS — K31819 Angiodysplasia of stomach and duodenum without bleeding: Secondary | ICD-10-CM | POA: Diagnosis not present

## 2021-08-07 DIAGNOSIS — Z905 Acquired absence of kidney: Secondary | ICD-10-CM

## 2021-08-07 DIAGNOSIS — K317 Polyp of stomach and duodenum: Secondary | ICD-10-CM | POA: Diagnosis present

## 2021-08-07 DIAGNOSIS — K2970 Gastritis, unspecified, without bleeding: Secondary | ICD-10-CM | POA: Diagnosis present

## 2021-08-07 DIAGNOSIS — K3189 Other diseases of stomach and duodenum: Secondary | ICD-10-CM | POA: Diagnosis not present

## 2021-08-07 DIAGNOSIS — E43 Unspecified severe protein-calorie malnutrition: Secondary | ICD-10-CM | POA: Diagnosis present

## 2021-08-07 DIAGNOSIS — Z885 Allergy status to narcotic agent status: Secondary | ICD-10-CM

## 2021-08-07 DIAGNOSIS — R197 Diarrhea, unspecified: Secondary | ICD-10-CM | POA: Diagnosis not present

## 2021-08-07 DIAGNOSIS — D61818 Other pancytopenia: Secondary | ICD-10-CM | POA: Diagnosis present

## 2021-08-07 DIAGNOSIS — R109 Unspecified abdominal pain: Secondary | ICD-10-CM

## 2021-08-07 DIAGNOSIS — R110 Nausea: Secondary | ICD-10-CM

## 2021-08-07 DIAGNOSIS — Z886 Allergy status to analgesic agent status: Secondary | ICD-10-CM

## 2021-08-07 DIAGNOSIS — Z7952 Long term (current) use of systemic steroids: Secondary | ICD-10-CM

## 2021-08-07 DIAGNOSIS — M109 Gout, unspecified: Secondary | ICD-10-CM | POA: Diagnosis present

## 2021-08-07 DIAGNOSIS — E1159 Type 2 diabetes mellitus with other circulatory complications: Secondary | ICD-10-CM | POA: Diagnosis not present

## 2021-08-07 DIAGNOSIS — K766 Portal hypertension: Secondary | ICD-10-CM | POA: Diagnosis present

## 2021-08-07 DIAGNOSIS — I8510 Secondary esophageal varices without bleeding: Secondary | ICD-10-CM | POA: Diagnosis present

## 2021-08-07 DIAGNOSIS — E785 Hyperlipidemia, unspecified: Secondary | ICD-10-CM | POA: Diagnosis not present

## 2021-08-07 DIAGNOSIS — R300 Dysuria: Secondary | ICD-10-CM | POA: Diagnosis present

## 2021-08-07 DIAGNOSIS — K7469 Other cirrhosis of liver: Secondary | ICD-10-CM

## 2021-08-07 DIAGNOSIS — C189 Malignant neoplasm of colon, unspecified: Secondary | ICD-10-CM | POA: Diagnosis present

## 2021-08-07 DIAGNOSIS — K7290 Hepatic failure, unspecified without coma: Secondary | ICD-10-CM | POA: Diagnosis present

## 2021-08-07 DIAGNOSIS — R188 Other ascites: Secondary | ICD-10-CM

## 2021-08-07 DIAGNOSIS — Z85038 Personal history of other malignant neoplasm of large intestine: Secondary | ICD-10-CM

## 2021-08-07 DIAGNOSIS — F419 Anxiety disorder, unspecified: Secondary | ICD-10-CM | POA: Diagnosis present

## 2021-08-07 DIAGNOSIS — Z79899 Other long term (current) drug therapy: Secondary | ICD-10-CM

## 2021-08-07 DIAGNOSIS — Z7984 Long term (current) use of oral hypoglycemic drugs: Secondary | ICD-10-CM

## 2021-08-07 DIAGNOSIS — D649 Anemia, unspecified: Secondary | ICD-10-CM | POA: Diagnosis present

## 2021-08-07 LAB — CBC WITH DIFFERENTIAL/PLATELET
Abs Immature Granulocytes: 0.02 10*3/uL (ref 0.00–0.07)
Abs Immature Granulocytes: 0.01 10*3/uL (ref 0.00–0.07)
Basophils Absolute: 0 10*3/uL (ref 0.0–0.1)
Basophils Absolute: 0 10*3/uL (ref 0.0–0.1)
Basophils Relative: 0 %
Basophils Relative: 0 %
Eosinophils Absolute: 0.1 10*3/uL (ref 0.0–0.5)
Eosinophils Absolute: 0.1 10*3/uL (ref 0.0–0.5)
Eosinophils Relative: 1 %
Eosinophils Relative: 1 %
HCT: 30.2 % — ABNORMAL LOW (ref 36.0–46.0)
HCT: 27.3 % — ABNORMAL LOW (ref 36.0–46.0)
Hemoglobin: 9.5 g/dL — ABNORMAL LOW (ref 12.0–15.0)
Hemoglobin: 8.3 g/dL — ABNORMAL LOW (ref 12.0–15.0)
Immature Granulocytes: 0 %
Immature Granulocytes: 0 %
Lymphocytes Relative: 21 %
Lymphocytes Relative: 27 %
Lymphs Abs: 1.5 10*3/uL (ref 0.7–4.0)
Lymphs Abs: 1.4 10*3/uL (ref 0.7–4.0)
MCH: 30 pg (ref 26.0–34.0)
MCH: 28.9 pg (ref 26.0–34.0)
MCHC: 31.5 g/dL (ref 30.0–36.0)
MCHC: 30.4 g/dL (ref 30.0–36.0)
MCV: 95.3 fL (ref 80.0–100.0)
MCV: 95.1 fL (ref 80.0–100.0)
Monocytes Absolute: 0.5 10*3/uL (ref 0.1–1.0)
Monocytes Absolute: 0.4 10*3/uL (ref 0.1–1.0)
Monocytes Relative: 7 %
Monocytes Relative: 8 %
Neutro Abs: 5 10*3/uL (ref 1.7–7.7)
Neutro Abs: 3.2 10*3/uL (ref 1.7–7.7)
Neutrophils Relative %: 71 %
Neutrophils Relative %: 64 %
Platelets: 202 10*3/uL (ref 150–400)
Platelets: 164 10*3/uL (ref 150–400)
RBC: 3.17 MIL/uL — ABNORMAL LOW (ref 3.87–5.11)
RBC: 2.87 MIL/uL — ABNORMAL LOW (ref 3.87–5.11)
RDW: 15.7 % — ABNORMAL HIGH (ref 11.5–15.5)
RDW: 15.8 % — ABNORMAL HIGH (ref 11.5–15.5)
WBC: 7.2 10*3/uL (ref 4.0–10.5)
WBC: 5 10*3/uL (ref 4.0–10.5)
nRBC: 0 % (ref 0.0–0.2)
nRBC: 0 % (ref 0.0–0.2)

## 2021-08-07 LAB — COMPREHENSIVE METABOLIC PANEL
ALT: 9 U/L (ref 0–44)
AST: 17 U/L (ref 15–41)
Albumin: 2.9 g/dL — ABNORMAL LOW (ref 3.5–5.0)
Alkaline Phosphatase: 99 U/L (ref 38–126)
Anion gap: 7 (ref 5–15)
BUN: 52 mg/dL — ABNORMAL HIGH (ref 8–23)
CO2: 21 mmol/L — ABNORMAL LOW (ref 22–32)
Calcium: 8.4 mg/dL — ABNORMAL LOW (ref 8.9–10.3)
Chloride: 108 mmol/L (ref 98–111)
Creatinine, Ser: 4.19 mg/dL — ABNORMAL HIGH (ref 0.44–1.00)
GFR, Estimated: 10 mL/min — ABNORMAL LOW (ref >60)
Glucose, Bld: 76 mg/dL (ref 70–99)
Potassium: 4.6 mmol/L (ref 3.5–5.1)
Sodium: 136 mmol/L (ref 135–145)
Total Bilirubin: 1.2 mg/dL (ref 0.3–1.2)
Total Protein: 5.8 g/dL — ABNORMAL LOW (ref 6.5–8.1)

## 2021-08-07 LAB — RESP PANEL BY RT-PCR (FLU A&B, COVID) ARPGX2
Influenza A by PCR: NEGATIVE
Influenza B by PCR: NEGATIVE
SARS Coronavirus 2 by RT PCR: NEGATIVE

## 2021-08-07 LAB — PROTIME-INR
INR: 1.2 (ref 0.8–1.2)
Prothrombin Time: 15.3 seconds — ABNORMAL HIGH (ref 11.4–15.2)

## 2021-08-07 LAB — LACTIC ACID, PLASMA: Lactic Acid, Venous: 0.9 mmol/L (ref 0.5–1.9)

## 2021-08-07 LAB — POC OCCULT BLOOD, ED: Fecal Occult Bld: NEGATIVE

## 2021-08-07 LAB — LIPASE, BLOOD: Lipase: 31 U/L (ref 11–51)

## 2021-08-07 MED ORDER — SODIUM CHLORIDE 0.9 % IV BOLUS
1000.0000 mL | Freq: Once | INTRAVENOUS | Status: AC
  Administered 2021-08-07: 1000 mL via INTRAVENOUS

## 2021-08-07 MED ORDER — ONDANSETRON HCL 4 MG/2ML IJ SOLN
4.0000 mg | Freq: Once | INTRAMUSCULAR | Status: AC
  Administered 2021-08-07: 4 mg via INTRAVENOUS
  Filled 2021-08-07: qty 2

## 2021-08-07 MED ORDER — INSULIN ASPART 100 UNIT/ML IJ SOLN
0.0000 [IU] | INTRAMUSCULAR | Status: DC
  Filled 2021-08-07: qty 0.09

## 2021-08-07 NOTE — ED Provider Notes (Signed)
[facility] DEPT Provider Note   CSN: [id number] Arrival date & time: 08/07/21  1340     History  Chief Complaint  Patient presents with   Abdominal Pain    Cheryl Jordan is a 81 y.o. female.  She has a history of colon cancer not currently on chemotherapy, cirrhosis getting paracentesis, CKD.  Complaining of 1 week of worsening of her chronic nausea, poor p.o. intake and diarrhea.  No recent antibiotics.  No fevers chills.  Feels generally weak and is having difficulty ambulating around.  Trying Gatorade without any improvement.  Zofran without improvement.  The history is provided by the patient.  Diarrhea Quality:  Unable to specify Severity:  Moderate Onset quality:  Gradual Duration:  1 week Timing:  Intermittent Progression:  Unchanged Relieved by:  Nothing Worsened by:  Nothing Ineffective treatments:  Anti-motility medications Associated symptoms: chills   Associated symptoms: no abdominal pain, no recent cough, no fever, no headaches and no vomiting   Risk factors: no recent antibiotic use       Home Medications Prior to Admission medications   Medication Sig Start Date End Date Taking? Authorizing Provider  ACCU-CHEK AVIVA PLUS test strip  10/20/18   [provider]  Acetaminophen (ACETAMINOPHEN EXTRA STRENGTH) 500 MG capsule     [provider]  acetaminophen (TYLENOL) 500 MG tablet Take 500 mg by mouth every 6 (six) hours as needed for moderate pain.    [provider]  amLODipine (NORVASC) 2.5 MG tablet Take 1 tablet (2.5 mg total) by mouth daily. 12/04/19   Brunetta Jeans, PA-C  Blood Glucose Calibration (ACCU-CHEK AVIVA) SOLN     [provider]  dapagliflozin propanediol (FARXIGA) 10 MG TABS tablet  09/01/20   [provider]  ferrous sulfate 325 (65 FE) MG tablet Take 325 mg by mouth daily with breakfast.    [provider]  furosemide (LASIX) 40 MG tablet Take 1.5 tablets (60  mg total) by mouth daily. Patient taking differently: Take 40 mg by mouth daily. 09/21/20   [name], MD  glipiZIDE (GLUCOTROL XL) 5 MG 24 hr tablet Take 5 mg by mouth daily with breakfast.    [provider]  levothyroxine (SYNTHROID) 88 MCG tablet Take 1 tablet (88 mcg total) by mouth daily. 12/04/19   Brunetta Jeans, PA-C  loperamide (IMODIUM) 2 MG capsule Take 2 mg by mouth as needed for diarrhea or loose stools.    [provider]  LORazepam (ATIVAN) 0.5 MG tablet Take 0.5 mg by mouth as needed for anxiety.    [provider]  omeprazole (PRILOSEC) 20 MG capsule Take 1 capsule (20 mg total) by mouth 2 (two) times daily before a meal. 08/10/20   Brunetta Jeans, PA-C  ondansetron [facility]) 4 MG tablet Take 1-2 tablets 20 minutes before meals 05/11/21   [name], MD  ondansetron (ZOFRAN-ODT) 4 MG disintegrating tablet Take 4 mg by mouth every 6 (six) hours. 05/09/21   [provider]  predniSONE (DELTASONE) 10 MG tablet Take 1 tablet (10 mg total) by mouth daily with breakfast. 06/13/21   Owens Shark, NP  propranolol (INDERAL) 40 MG tablet Take 20 mg by mouth 2 (two) times daily. 09/30/20   [provider]  Simethicone (GAS-X PO) Take 1 tablet by mouth as needed (flatulence).    [provider]  sucralfate (CARAFATE) 1 GM/10ML suspension     [provider]  telmisartan (MICARDIS) 40  MG tablet Take 20 mg by mouth 2 (two) times daily.    [provider]  Vitamin D, Ergocalciferol, (DRISDOL) 1.25 MG (50000 UNIT) CAPS capsule Take 50,000 Units by mouth once a week. 06/01/21   [provider]  rivaroxaban (XARELTO) 20 MG TABS tablet Take 1 tablet (20 mg total) by mouth daily with supper. Patient not taking: Reported on 08/04/2020 07/08/20 08/10/20  Eugenie Filler, MD      Allergies    Aspirin and Codeine    Review of Systems   Review of Systems  Constitutional:  Positive for chills and fatigue.  Negative for fever.  HENT:  Negative for sore throat.   Eyes:  Negative for visual disturbance.  Respiratory:  Negative for shortness of breath.   Cardiovascular:  Negative for chest pain.  Gastrointestinal:  Positive for diarrhea and nausea. Negative for abdominal pain and vomiting.  Genitourinary:  Negative for dysuria.  Musculoskeletal:  Negative for neck pain.  Skin:  Negative for rash.  Neurological:  Negative for headaches.   Physical Exam Updated Vital Signs BP (!) 142/59 (BP Location: Left Arm)    Pulse 81    Temp 98 F (36.7 C) (Oral)    Resp 18    SpO2 99%  Physical Exam Vitals and nursing note reviewed.  Constitutional:      General: She is not in acute distress.    Appearance: Normal appearance. She is well-developed.  HENT:     Head: Normocephalic and atraumatic.  Eyes:     Conjunctiva/sclera: Conjunctivae normal.  Cardiovascular:     Rate and Rhythm: Normal rate and regular rhythm.     Heart sounds: No murmur heard. Pulmonary:     Effort: Pulmonary effort is normal. No respiratory distress.     Breath sounds: Normal breath sounds.  Abdominal:     Palpations: Abdomen is soft.     Tenderness: There is no abdominal tenderness. There is no guarding or rebound.  Musculoskeletal:        General: No swelling.     Cervical back: Neck supple.     Right lower leg: No edema.     Left lower leg: No edema.  Skin:    General: Skin is warm and dry.     Capillary Refill: Capillary refill takes less than 2 seconds.  Neurological:     General: No focal deficit present.     Mental Status: She is alert.    ED Results / Procedures / Treatments   Labs (all labs ordered are listed, but only abnormal results are displayed) Labs Reviewed  COMPREHENSIVE METABOLIC PANEL - Abnormal; Notable for the following components:      Result Value   CO2 21 (*)    BUN 52 (*)    Creatinine, Ser 4.19 (*)    Calcium 8.4 (*)    Total Protein 5.8 (*)    Albumin 2.9 (*)    GFR, Estimated  10 (*)    All other components within normal limits  CBC WITH DIFFERENTIAL/PLATELET - Abnormal; Notable for the following components:   RBC 3.17 (*)    Hemoglobin 9.5 (*)    HCT 30.2 (*)    RDW 15.7 (*)    All other components within normal limits  URINALYSIS, ROUTINE W REFLEX MICROSCOPIC - Abnormal; Notable for the following components:   APPearance HAZY (*)    Leukocytes,Ua MODERATE (*)    Bacteria, UA RARE (*)    Non Squamous Epithelial 0-5 (*)  All other components within normal limits  PROTIME-INR - Abnormal; Notable for the following components:   Prothrombin Time 15.3 (*)    All other components within normal limits  CK - Abnormal; Notable for the following components:   Total CK 20 (*)    All other components within normal limits  HEPATIC FUNCTION PANEL - Abnormal; Notable for the following components:   Total Protein 5.5 (*)    Albumin 2.8 (*)    Total Bilirubin 1.4 (*)    Bilirubin, Direct 0.5 (*)    All other components within normal limits  PHOSPHORUS - Abnormal; Notable for the following components:   Phosphorus 5.0 (*)    All other components within normal limits  OSMOLALITY, URINE - Abnormal; Notable for the following components:   Osmolality, Ur 292 (*)    All other components within normal limits  OSMOLALITY - Abnormal; Notable for the following components:   Osmolality 302 (*)    All other components within normal limits  CBC WITH DIFFERENTIAL/PLATELET - Abnormal; Notable for the following components:   RBC 2.87 (*)    Hemoglobin 8.3 (*)    HCT 27.3 (*)    RDW 15.8 (*)    All other components within normal limits  MAGNESIUM - Abnormal; Notable for the following components:   Magnesium 2.5 (*)    All other components within normal limits  PHOSPHORUS - Abnormal; Notable for the following components:   Phosphorus 4.9 (*)    All other components within normal limits  CBC WITH DIFFERENTIAL/PLATELET - Abnormal; Notable for the following components:   RBC  2.89 (*)    Hemoglobin 8.5 (*)    HCT 27.6 (*)    RDW 15.9 (*)    All other components within normal limits  COMPREHENSIVE METABOLIC PANEL - Abnormal; Notable for the following components:   CO2 20 (*)    Glucose, Bld 104 (*)    BUN 51 (*)    Creatinine, Ser 4.09 (*)    Calcium 8.3 (*)    Total Protein 5.6 (*)    Albumin 2.8 (*)    Total Bilirubin 1.4 (*)    GFR, Estimated 11 (*)    All other components within normal limits  PROTIME-INR - Abnormal; Notable for the following components:   Prothrombin Time 16.0 (*)    INR 1.3 (*)    All other components within normal limits  GLUCOSE, CAPILLARY - Abnormal; Notable for the following components:   Glucose-Capillary 67 (*)    All other components within normal limits  RESP PANEL BY RT-PCR (FLU A&B, COVID) [id number]  GASTROINTESTINAL PANEL BY PCR, STOOL (REPLACES STOOL CULTURE)  C DIFFICILE QUICK SCREEN W PCR REFLEX    LIPASE, BLOOD  MAGNESIUM  TSH  CREATININE, URINE, RANDOM  SODIUM, URINE, RANDOM  LACTIC ACID, PLASMA  LACTIC ACID, PLASMA  GLUCOSE, CAPILLARY  HEMOGLOBIN A1C  POC OCCULT BLOOD, ED  CBG MONITORING, ED    EKG EKG Interpretation  Date/Time:  Monday August 07 2021 23:01:51 EST Ventricular Rate:  82 PR Interval:  188 QRS Duration: 78 QT Interval:  406 QTC Calculation: 475 R Axis:   18 Text Interpretation: Sinus rhythm Borderline low voltage, extremity leads Abnormal R-wave progression, early transition Interpretation limited secondary to artifact Confirmed by Ripley Fraise [phone]) on 08/08/2021 1:34:42 AM  Radiology DG Abdomen Acute W/Chest  Result Date: 08/07/2021 CLINICAL DATA:  Nausea and abdominal pain. History of colon cancer, hepatic cirrhosis and intractable ascites. Paracentesis performed last week. EXAM: DG  ABDOMEN ACUTE WITH 1 VIEW CHEST COMPARISON:  Portable chest 03/02/2021, CT abdomen and pelvis no contrast 12/13/2020. FINDINGS: There is no evidence of dilated bowel loops or free intraperitoneal  air. There is suspected to be at least mild-to-moderate volume ascites, as there is apparent medial displacement of the ascending and descending colon, which was seen on the prior CT. No radiopaque calculi or other significant radiographic abnormality is seen. There are cholecystectomy clips. Heart size and mediastinal contours are within normal limits. There is a low pulmonary inspiration with a small left pleural effusion similar to both prior studies, and atelectasis in the hypoinflated lung bases. The visualized lungs are otherwise clear. IMPRESSION: 1. Small left pleural effusion appears similar to the prior studies. The lungs hypoinflated and otherwise generally clear except for lower zonal atelectasis. 2. No evidence of bowel obstruction or free air. Probable mild-to-moderate ascites based on medial displacement of the ascending and descending colon as seen on the prior CT. Electronically Signed   By: Telford Nab M.D.   On: 08/07/2021 21:46    Procedures Procedures    Medications Ordered in ED Medications  sodium chloride 0.9 % bolus 1,000 mL (has no administration in time range)    ED Course/ Medical Decision Making/ A&P Clinical Course as of 08/08/21 0915  Mon Aug 07, 2021  2011 Rectal exam done with tech as chaperone.  Normal tone no stool in vault.  No masses.  Sample sent to lab for guaiac [MB]  2120 Discussed with Dr. Roel Cluck [facility] hospitalist who will evaluate the patient for admission.  She asked if we can get a 3 view abdomen series [MB]    Clinical Course User Index [MB] Hayden Rasmussen, MD                           Medical Decision Making Amount and/or Complexity of Data Reviewed Radiology: ordered.  Risk Prescription drug management. Decision regarding hospitalization.  Cheryl Jordan was evaluated in Emergency Department on 08/07/2021 for the symptoms described in the history of present illness. She was evaluated in the context of the global COVID-19 pandemic, which  necessitated consideration that the patient might be at risk for infection with the SARS-CoV-2 virus that causes COVID-19. Institutional protocols and algorithms that pertain to the evaluation of patients at risk for COVID-19 are in a state of rapid change based on information released by regulatory bodies including the CDC and federal and state organizations. These policies and algorithms were followed during the patient's care in the ED.  This patient complains of nausea and diarrhea, dehydration, weakness; this involves an extensive number of treatment Options and is a complaint that carries with it a high risk of complications and Morbidity. The differential includes infectious diarrhea, dehydration, AKI, metabolic derangement  I ordered, reviewed and interpreted labs, which included CBC with normal white count, hemoglobin slightly lower than priors will need to be trended, chemistries with low bicarb elevated BUN and creatinine consistent with dehydration, COVID and flu negative, fecal occult negative, urinalysis ordered and pending I ordered medication IV fluids and nausea medication with some improvement in her symptoms I ordered imaging studies which included acute abdominal series and I independently    visualized and interpreted imaging which showed probable ascites Additional history obtained from patient's family member Previous records obtained and reviewed in epic including recent paracentesis notes.  With patient's new AKI feel she would be benefiting from admission for hydration and further  management of her symptoms patient in agreement with plan. I consulted Dr. Roel Cluck [facility] hospitalist and discussed lab and imaging findings  Critical Interventions: None  After the interventions stated above, I reevaluated the patient and found patient to be symptomatically improving.  She is agreeable to admission for further management.          Final Clinical Impression(s) / ED  Diagnoses Final diagnoses:  AKI (acute kidney injury) ([place])  Nausea  Diarrhea, unspecified type    Rx / DC Orders ED Discharge Orders     None         Hayden Rasmussen, MD 08/08/21 [id number]

## 2021-08-07 NOTE — ED Triage Notes (Addendum)
Pt arrived via POV, c/o n/v and diarrhea x1 wk. Was prescribed zofran with no relief.

## 2021-08-07 NOTE — H&P (Signed)
Cheryl Jordan LDJ:[id number] DOB: May 02, 1941 DOA: 08/07/2021     PCP: Wannetta Sender, FNP   Outpatient Specialists:     NEphrology:    Dr. Royce Macadamia    Oncology  Dr. Benay Spice GI  Dr.Cerigliano    Patient arrived to ER on 08/07/21 at 1340 Referred by Attending Hayden Rasmussen, MD   Patient coming from: home Lives With family    Chief Complaint:   Chief Complaint  Patient presents with   Abdominal Pain    HPI: Cheryl Jordan is a 81 y.o. female with medical history significant of DM2, cirrhosis, colon cancer    Presented with  nuasea and diarrhea Nausea diarrhea, poor PO intake for few wks Was prescribed Zofran no help Recently been having some nausea was taking prednisone for that  Reports diarrhea ofr a week or so about 3 BM a day She took imodium it did help No recent ABX She had a bit of chills no fever Occasional dysuria  She report no blood in stool No tobacco no etoh   Has  been vaccinated against COVID no boosted   no flu shot   Initial COVID TEST  NEGATIVE   Lab Results  Component Value Date   [place] NEGATIVE 08/07/2021   [place] RESULT: NEGATIVE 03/06/2021   [place] NEGATIVE 06/30/2020   [place] NEGATIVE 06/15/2020     Regarding pertinent Chronic problems:     Hx of Colon cancer sp surgery   HTN on propranolol, norvasc   chronic CHF diastolic - last echo Sep 0300 On lasix       DM 2 -  Lab Results  Component Value Date   HGBA1C 5.1 03/02/2021   on  PO meds only,    Hypothyroidism:  Lab Results  Component Value Date   TSH 0.88 01/27/2020   on synthroid     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  Lab Results  Component Value Date   CREATININE 4.19 (H) 08/07/2021   CREATININE 2.46 (H) 03/21/2021   CREATININE 2.72 (H) 03/08/2021       Liver disease MELD-Na score: 23 at 08/07/2021  3:10 PM    Chronic anemia -  baseline hg Hemoglobin & Hematocrit  Recent Labs    04/18/21 1106 06/13/21 1256 08/07/21 1510  HGB 11.0* 10.7* 9.5*     While in ER: Clinical Course as of 08/07/21 2221  Mon Aug 07, 2021  2011 Rectal exam done with tech as chaperone.  Normal tone no stool in vault.  No masses.  Sample sent to lab for guaiac [MB]  2120 Discussed with Dr. Roel Cluck [facility] who will evaluate the patient for admission.  She asked if we can get a 3 view abdomen series [MB]    Clinical Course User Index [MB] Hayden Rasmussen, MD     Ordered    CXR -  NON acute  KUB - mild ascites  Following Medications were ordered in ER: Medications  sodium chloride 0.9 % bolus 1,000 mL (1,000 mLs Intravenous New Bag/Given 08/07/21 1955)  ondansetron (ZOFRAN) injection 4 mg (4 mg Intravenous Given 08/07/21 1955)       ED Triage Vitals [08/07/21 1412]  Enc Vitals Group     BP 133/77     Pulse Rate 83     Resp 18     Temp 98 F (36.7 C)  Temp Source Oral     SpO2 99 %     Weight      Height      Head Circumference      Peak Flow      Pain Score      Pain Loc      Pain Edu?      Excl. in [place]?   TMLY(65)@     _________________________________________ Significant initial  Findings: Abnormal Labs Reviewed  COMPREHENSIVE METABOLIC PANEL - Abnormal; Notable for the following components:      Result Value   CO2 21 (*)    BUN 52 (*)    Creatinine, Ser 4.19 (*)    Calcium 8.4 (*)    Total Protein 5.8 (*)    Albumin 2.9 (*)    GFR, Estimated 10 (*)    All other components within normal limits  CBC WITH DIFFERENTIAL/PLATELET - Abnormal; Notable for the following components:   RBC 3.17 (*)    Hemoglobin 9.5 (*)    HCT 30.2 (*)    RDW 15.7 (*)    All other components within normal limits  PROTIME-INR - Abnormal; Notable for the following components:   Prothrombin Time 15.3 (*)    All other components within normal limits       ECG: Ordered Personally reviewed by me showing: HR :  82 Rhythm:  NSR,    nonspecific changes,  QTC 475    The recent clinical data is shown below. Vitals:   08/07/21 1412 08/07/21 1832 08/07/21 1930 08/07/21 2030  BP: 133/77 (!) 142/59 128/63 (!) 139/57  Pulse: 83 81 82 80  Resp: 18 18 18 18   Temp: 98 F (36.7 C)     TempSrc: Oral     SpO2: 99% 99% 97% 97%    WBC     Component Value Date/Time   WBC 7.2 08/07/2021 1510   LYMPHSABS 1.5 08/07/2021 1510   LYMPHSABS 2.1 02/07/2017 0942   MONOABS 0.5 08/07/2021 1510   MONOABS 0.3 02/07/2017 0942   EOSABS 0.1 08/07/2021 1510   EOSABS 0.1 02/07/2017 0942   BASOSABS 0.0 08/07/2021 1510   BASOSABS 0.0 02/07/2017 0942      Lactic Acid, Venous    Component Value Date/Time   LATICACIDVEN 0.9 08/07/2021 2300      UA  leukocytes but no bacteria   Urine analysis:    Component Value Date/Time   COLORURINE YELLOW 08/08/2021 0030   APPEARANCEUR HAZY (A) 08/08/2021 0030   LABSPEC 1.009 08/08/2021 0030   PHURINE 5.0 08/08/2021 0030   GLUCOSEU NEGATIVE 08/08/2021 0030   HGBUR NEGATIVE 08/08/2021 0030   BILIRUBINUR NEGATIVE 08/08/2021 0030   BILIRUBINUR Negative 12/04/2019 1352   KETONESUR NEGATIVE 08/08/2021 0030   PROTEINUR NEGATIVE 08/08/2021 0030   UROBILINOGEN 0.2 12/04/2019 1352   UROBILINOGEN 0.2 02/07/2011 1832   NITRITE NEGATIVE 08/08/2021 0030   LEUKOCYTESUR MODERATE (A) 08/08/2021 0030    Results for orders placed or performed during the hospital encounter of 08/07/21  Resp Panel by RT-PCR (Flu A&B, Covid) Nasopharyngeal Swab     Status: None   Collection Time: 08/07/21  7:25 PM   Specimen: Nasopharyngeal Swab; Nasopharyngeal(NP) swabs in vial transport medium  Result Value Ref Range Status   SARS Coronavirus 2 by RT PCR NEGATIVE NEGATIVE Final         Influenza A by PCR NEGATIVE NEGATIVE Final   Influenza B by PCR NEGATIVE NEGATIVE Final  _______________________________________________ Hospitalist was called for admission for AKI  The following  Work up has been ordered so far:  Orders Placed This Encounter  Procedures   Gastrointestinal Panel by PCR , Stool   C Difficile Quick Screen w PCR reflex   Resp Panel by RT-PCR (Flu A&B, Covid) Nasopharyngeal Swab   Comprehensive metabolic panel   Lipase, blood   CBC with Differential   Urinalysis, Routine w reflex microscopic   Protime-INR   Consult to hospitalist   Enteric precautions (UV disinfection)   POC occult blood, ED     OTHER Significant initial  Findings:  labs showing:    Recent Labs  Lab 08/07/21 1510  NA 136  K 4.6  CO2 21*  GLUCOSE 76  BUN 52*  CREATININE 4.19*  CALCIUM 8.4*    Cr    Up from baseline see below Lab Results  Component Value Date   CREATININE 4.19 (H) 08/07/2021   CREATININE 2.46 (H) 03/21/2021   CREATININE 2.72 (H) 03/08/2021    Recent Labs  Lab 08/07/21 1510  AST 17  ALT 9  ALKPHOS 99  BILITOT 1.2  PROT 5.8*  ALBUMIN 2.9*   Lab Results  Component Value Date   CALCIUM 8.4 (L) 08/07/2021   PHOS 3.4 06/18/2020       Plt: Lab Results  Component Value Date   PLT 202 08/07/2021     COVID-19 Labs  No results for input(s): DDIMER, FERRITIN, LDH, CRP in the last 72 hours.  Lab Results  Component Value Date   SARSCOV2NAA NEGATIVE 08/07/2021   SARSCOV2NAA RESULT: NEGATIVE 03/06/2021   SARSCOV2NAA NEGATIVE 06/30/2020   [place] NEGATIVE 06/15/2020          Recent Labs  Lab 08/07/21 1510 08/07/21 2300 08/08/21 0221  WBC 7.2 5.0 5.0  NEUTROABS 5.0 3.2 3.3  HGB 9.5* 8.3* 8.5*  HCT 30.2* 27.3* 27.6*  MCV 95.3 95.1 95.5  PLT 202 164 167    HG/HCT   Down   from baseline see below    Component Value Date/Time   HGB 9.5 (L) 08/07/2021 1510   HGB 10.7 (L) 06/13/2021 1256   HGB 13.0 02/07/2017 0942   HCT 30.2 (L) 08/07/2021 1510   HCT 40.0 02/07/2017 0942   MCV 95.3 08/07/2021 1510   MCV 96.4 02/07/2017 0942     Recent Labs  Lab 08/07/21 1510  LIPASE 31   No results for input(s): AMMONIA in the  last 168 hours.     DM  labs:  HbA1C: Recent Labs    03/02/21 1045  HGBA1C 5.1       CBG (last 3)  No results for input(s): GLUCAP in the last 72 hours.        Cultures:    Component Value Date/Time   SDES PERITONEAL FLUID 05/16/2021 1410   SDES PERITONEAL FLUID 05/16/2021 1410   SPECREQUEST ABDOMEN 05/16/2021 1410   SPECREQUEST ABDOMEN 05/16/2021 1410   CULT  05/16/2021 1410    NO GROWTH 5 DAYS Performed at [facility], [place] [address] [place], [place] [address]    REPTSTATUS 05/21/2021 FINAL 05/16/2021 1410   REPTSTATUS 05/16/2021 FINAL 05/16/2021 1410     Radiological Exams on Admission: No results found. _______________________________________________________________________________________________________ Latest  Blood pressure (!) 139/57, pulse 80, temperature 98 F (36.7 C), temperature source Oral, resp. rate 18, SpO2 97 %.   Vitals  labs and radiology finding personally reviewed  Review of Systems:    Pertinent positives include:  chills  nausea, fatigue,   Constitutional:  No weight loss, night sweats, Fevers, weight loss  HEENT:  No headaches, Difficulty swallowing,Tooth/dental problems,Sore throat,  No sneezing, itching, ear ache, nasal congestion, post nasal drip,  Cardio-vascular:  No chest pain, Orthopnea, PND, anasarca, dizziness, palpitations.no Bilateral lower extremity swelling  GI:  No heartburn, indigestion, abdominal pain, vomiting, diarrhea, change in bowel habits, loss of appetite, melena, blood in stool, hematemesis Resp:  no shortness of breath at rest. No dyspnea on exertion, No excess mucus, no productive cough, No non-productive cough, No coughing up of blood.No change in color of mucus.No wheezing. Skin:  no rash or lesions. No jaundice GU:  no dysuria, change in color of urine, no urgency or frequency. No straining to urinate.  No flank pain.  Musculoskeletal:  No joint pain or no joint swelling. No decreased range of  motion. No back pain.  Psych:  No change in mood or affect. No depression or anxiety. No memory loss.  Neuro: no localizing neurological complaints, no tingling, no weakness, no double vision, no gait abnormality, no slurred speech, no confusion  All systems reviewed and apart from [place] all are negative _______________________________________________________________________________________________ Past Medical History:   Past Medical History:  Diagnosis Date   Allergy    Anemia    hx of   Anxiety    Aortic stenosis    mild by 04/08/20 echo   Arthritis    Colon cancer ([place]) dx'd 11/2014   Diabetes mellitus without complication (HCC)    TYPE II   Dyspnea    Fatty liver    Gallbladder disease    GERD (gastroesophageal reflux disease)    Heart murmur    History of gout    Hypertension    Hypothyroidism    Kidney disorder    spot on left kidney   Osteoporosis    PONV (postoperative nausea and vomiting)       Past Surgical History:  Procedure Laterality Date   BIOPSY  06/17/2020   Procedure: BIOPSY;  Surgeon: Irving Copas., MD;  Location: [facility];  Service: Gastroenterology;;   CATARACT EXTRACTION Left    CHOLECYSTECTOMY     COLONOSCOPY WITH PROPOFOL N/A 06/17/2020   Procedure: COLONOSCOPY WITH PROPOFOL;  Surgeon: Irving Copas., MD;  Location: [facility];  Service: Gastroenterology;  Laterality: N/A;   COLONOSCOPY WITH PROPOFOL N/A 07/01/2020   Procedure: COLONOSCOPY WITH PROPOFOL;  Surgeon: Mauri Pole, MD;  Location: [facility];  Service: Endoscopy;  Laterality: N/A;   ESOPHAGOGASTRODUODENOSCOPY (EGD) WITH PROPOFOL N/A 06/17/2020   Procedure: ESOPHAGOGASTRODUODENOSCOPY (EGD) WITH PROPOFOL;  Surgeon: Rush Landmark Telford Nab., MD;  Location: [facility];  Service: Gastroenterology;  Laterality: N/A;   ESOPHAGOGASTRODUODENOSCOPY (EGD) WITH PROPOFOL N/A 07/01/2020   Procedure: ESOPHAGOGASTRODUODENOSCOPY (EGD) WITH PROPOFOL;  Surgeon:  Mauri Pole, MD;  Location: [facility];  Service: Endoscopy;  Laterality: N/A;   FOREIGN BODY REMOVAL  06/17/2020   Procedure: FOREIGN BODY REMOVAL;  Surgeon: Rush Landmark Telford Nab., MD;  Location: [facility];  Service: Gastroenterology;;   GI RADIOFREQUENCY ABLATION  06/17/2020   Procedure: GI RADIOFREQUENCY ABLATION;  Surgeon: Irving Copas., MD;  Location: [facility];  Service: Gastroenterology;;   HEMOSTASIS CLIP PLACEMENT  06/17/2020   Procedure: HEMOSTASIS CLIP PLACEMENT;  Surgeon: Irving Copas., MD;  Location: [facility];  Service: Gastroenterology;;   HEMOSTASIS CLIP PLACEMENT  07/01/2020   Procedure: HEMOSTASIS CLIP PLACEMENT;  Surgeon: Mauri Pole, MD;  Location: [facility];  Service: Endoscopy;;   HOT HEMOSTASIS N/A 07/01/2020  Procedure: HOT HEMOSTASIS (ARGON PLASMA COAGULATION/BICAP);  Surgeon: Mauri Pole, MD;  Location: [facility];  Service: Endoscopy;  Laterality: N/A;   IR PARACENTESIS  08/19/2020   IR PARACENTESIS  09/26/2020   IR PARACENTESIS  11/03/2020   IR PARACENTESIS  11/28/2020   IR PARACENTESIS  12/21/2020   IR PARACENTESIS  01/17/2021   IR PARACENTESIS  02/08/2021   IR PARACENTESIS  02/23/2021   IR PARACENTESIS  03/22/2021   IR PARACENTESIS  04/06/2021   IR PARACENTESIS  04/20/2021   IR PARACENTESIS  05/03/2021   IR PARACENTESIS  05/16/2021   IR PARACENTESIS  05/29/2021   IR PARACENTESIS  06/07/2021   IR PARACENTESIS  06/15/2021   IR PARACENTESIS  06/28/2021   IR PARACENTESIS  07/05/2021   IR PARACENTESIS  07/14/2021   IR PARACENTESIS  07/21/2021   IR PARACENTESIS  08/01/2021   IR RADIOLOGIST EVAL & MGMT  01/24/2017   IR RADIOLOGIST EVAL & MGMT  02/22/2021   IR RADIOLOGIST EVAL & MGMT  03/28/2021   IR RADIOLOGIST EVAL & MGMT  06/06/2021   KNEE CARTILAGE SURGERY Bilateral    NEPHRECTOMY Left 02/10/2015   Procedure: OPEN RETROPERINTONEAL EXPLORATION LEFT RENAL  CYST DECORTICATION X 5;  Surgeon: Cleon Gustin, MD;  Location: [facility];  Service: Urology;  Laterality: Left;   PARTIAL COLECTOMY N/A 02/10/2015   Procedure: OPEN RIGHT  COLECTOMY ;  Surgeon: [name], MD;  Location: [facility];  Service: General;  Laterality: N/A;   POLYPECTOMY  06/17/2020   Procedure: POLYPECTOMY;  Surgeon: Irving Copas., MD;  Location: [facility];  Service: Gastroenterology;;   RADIOLOGY WITH ANESTHESIA N/A 03/08/2021   Procedure: CT MICROWAVE ABLATION WITH ANESTHESIA;  Surgeon: Arne Cleveland, MD;  Location: [facility];  Service: Radiology;  Laterality: N/A;   TUBAL LIGATION      Social History:  Ambulatory   cane      reports that she has never smoked. She has never used smokeless tobacco. She reports that she does not drink alcohol and does not use drugs.     Family History:   Family History  Problem Relation Age of Onset   Diabetes Mother    Hypertension Other    COPD Father    Hypertension Sister    GER disease Sister    Heart attack Brother    COPD Brother    Lung cancer Brother    COPD Brother    Hypertension Brother    ______________________________________________________________________________________________ Allergies: Allergies  Allergen Reactions   Aspirin Other (See Comments)    Runny nose  Other reaction(s): runny nose   Codeine Nausea And Vomiting    Other reaction(s): nausea, voiting     Prior to Admission medications   Medication Sig Start Date End Date Taking? Authorizing Provider  ACCU-CHEK AVIVA PLUS test strip  10/20/18   [provider]  Acetaminophen (ACETAMINOPHEN EXTRA STRENGTH) 500 MG capsule     [provider]  acetaminophen (TYLENOL) 500 MG tablet Take 500 mg by mouth every 6 (six) hours as needed for moderate pain.    [provider]  amLODipine (NORVASC) 2.5 MG tablet Take 1 tablet (2.5 mg total) by mouth daily. 12/04/19   Brunetta Jeans, PA-C  Blood Glucose Calibration (ACCU-CHEK AVIVA) SOLN     [provider]  dapagliflozin propanediol (FARXIGA) 10 MG TABS tablet  09/01/20   [provider]  ferrous sulfate 325 (65 FE) MG tablet Take 325 mg by mouth daily with breakfast.  [provider]  furosemide (LASIX) 40 MG tablet Take 1.5 tablets (60 mg total) by mouth daily. Patient taking differently: Take 40 mg by mouth daily. 09/21/20   [name], MD  glipiZIDE (GLUCOTROL XL) 5 MG 24 hr tablet Take 5 mg by mouth daily with breakfast.    [provider]  levothyroxine (SYNTHROID) 88 MCG tablet Take 1 tablet (88 mcg total) by mouth daily. 12/04/19   Brunetta Jeans, PA-C  loperamide (IMODIUM) 2 MG capsule Take 2 mg by mouth as needed for diarrhea or loose stools.    [provider]  LORazepam (ATIVAN) 0.5 MG tablet Take 0.5 mg by mouth as needed for anxiety.    [provider]  omeprazole (PRILOSEC) 20 MG capsule Take 1 capsule (20 mg total) by mouth 2 (two) times daily before a meal. 08/10/20   Brunetta Jeans, PA-C  ondansetron [facility]) 4 MG tablet Take 1-2 tablets 20 minutes before meals 05/11/21   [name], MD  ondansetron (ZOFRAN-ODT) 4 MG disintegrating tablet Take 4 mg by mouth every 6 (six) hours. 05/09/21   [provider]  predniSONE (DELTASONE) 10 MG tablet Take 1 tablet (10 mg total) by mouth daily with breakfast. 06/13/21   Owens Shark, NP  propranolol (INDERAL) 40 MG tablet Take 20 mg by mouth 2 (two) times daily. 09/30/20   [provider]  Simethicone (GAS-X PO) Take 1 tablet by mouth as needed (flatulence).    [provider]  sucralfate (CARAFATE) 1 GM/10ML suspension     [provider]  telmisartan (MICARDIS) 40 MG tablet Take 20 mg by mouth 2 (two) times daily.    [provider]  Vitamin D, Ergocalciferol, (DRISDOL) 1.25 MG (50000 UNIT) CAPS capsule Take 50,000 Units by mouth once a week. 06/01/21   [provider]  rivaroxaban (XARELTO) 20 MG TABS tablet Take 1 tablet (20 mg  total) by mouth daily with supper. Patient not taking: Reported on 08/04/2020 07/08/20 08/10/20  Eugenie Filler, MD    ___________________________________________________________________________________________________ Physical Exam: Vitals with BMI 08/07/2021 08/07/2021 08/07/2021  Height - - -  Weight - - -  BMI - - -  Systolic 706 237 628  Diastolic 57 63 59  Pulse 80 82 81     1. General:  in No  Acute distress   Chronically ill   -appearing 2. Psychological: Alert and  Oriented 3. Head/ENT:    Dry Mucous Membranes                          Head Non traumatic, neck supple                           Poor Dentition 4. SKIN:   decreased Skin turgor,  Skin clean Dry and intact no rash 5. Heart: Regular rate and rhythm no  Murmur, no Rub or gallop 6. Lungs:  Clear to auscultation bilaterally, no wheezes or crackles   7. Abdomen: Soft,  non-tender, Non distended   obese  bowel sounds present 8. Lower extremities: no clubbing, cyanosis, no  edema 9. Neurologically Grossly intact, moving all 4 extremities equally no asterexis 10. MSK: Normal range of motion    Chart has been reviewed  ______________________________________________________________________________________________  Assessment/Plan 81 y.o. female with medical history significant of DM2, cirrhosis, colon cancer  Admitted for  aki on ckd  Present on Admission:  Hypothyroidism  GERD (  gastroesophageal reflux disease)  Adenocarcinoma of colon (HCC)  Chronic kidney disease (CKD), stage III (moderate) (HCC)  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[place])  Hypertension associated with diabetes ([place])  AKI (acute kidney injury) ([place])  Other cirrhosis of liver (HCC)  Diarrhea     Hypothyroidism - Check TSH continue home medications at current dose   GERD (gastroesophageal reflux disease) Chronic stable  Adenocarcinoma of colon ([place]) Followed by Dr. Lucendia Herrlich as an outpatient.  Reports has been stable  Chronic  kidney disease (CKD), stage III (moderate) (HCC)  -chronic avoid nephrotoxic medications such as NSAIDs, Vanco Zosyn combo,  avoid hypotension, continue to follow renal function   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[place]) Order sliding scale monitor CBG  Hypertension associated with diabetes ([place]) Restart propranolol and monitor blood pressure  AKI (acute kidney injury) ([place]) Acute on chronic renal failure in the setting of dehydration and diarrhea will rehydrate and follow Obtain urine electrolytes  Other cirrhosis of liver (HCC) Chronic stable patient does not drink alcohol.  Reports she is gets regular paracenteses once a week last one was last week.  No evidence of severe ascites currently no abdominal pelvic pain to suggest SBP. MELD-Na score: 23 at 08/08/2021  2:21 AM No evidence of asterixis   Diarrhea Reports somewhat improving with Imodium.  No antibiotic use.  Ordered C. difficile and GI panel    Other plan as per orders.  DVT prophylaxis:  SCD       Code Status: DNR/DNI  as per patient   I had personally discussed CODE STATUS with patient      Family Communication:   Family not at  Bedside    Disposition Plan:     To home once workup is complete and patient is stable   Following barriers for discharge:                            Electrolytes corrected                               Anemia stable                       Would benefit from PT/OT eval prior to DC  Ordered                                        Consults called: None  Admission status:  ED Disposition     ED Disposition  Admit   Condition  --   [place]: [place] [[id number]]  Level of Care: Telemetry [5]  Admit to tele based on following criteria: Other see comments  Comments: aki  May admit patient to [name] or [name] if equivalent level of care is available:: No  Covid Evaluation: Confirmed COVID Negative  Diagnosis: AKI (acute kidney  injury) [facility]) [[id number]]  Admitting Physician: Toy Baker [[id number]]  Attending Physician: Toy Baker [[id number]]  Estimated length of stay: past midnight tomorrow  Certification:: I certify this patient will need inpatient services for at least 2 midnights           inpatient     I Expect 2 midnight stay secondary to severity of patient's current illness need for inpatient interventions justified by the following:  Severe lab/radiological/exam abnormalities including:    AKI and extensive comorbidities including:  DM2    CHF  asthma    CKD   malignancy,    That are currently affecting medical management.   I expect  patient to be hospitalized for 2 midnights requiring inpatient medical care.  Patient is at high risk for adverse outcome (such as loss of life or disability) if not treated.  Indication for inpatient stay as follows:    inability to maintain oral hydration      Need for IV antibiotics, IV fluids, IV rate controling medications, IV antihypertensives, IV pain medications, IV anticoagulation, need for biPAP    Level of care     tele  For 12H      Lab Results  Component Value Date   [place] 08/07/2021     Precautions: admitted as   Covid Negative      Azim Gillingham 08/08/2021, 3:04 AM    [facility]     after 2 AM please page floor coverage PA If 7AM-7PM, please contact the day team taking care of the patient using [email or web address]   Patient was evaluated in the context of the global COVID-19 pandemic, which necessitated consideration that the patient might be at risk for infection with the SARS-CoV-2 virus that causes COVID-19. Institutional protocols and algorithms that pertain to the evaluation of patients at risk for COVID-19 are in a state of rapid change based on information released by regulatory bodies including the CDC and federal and state organizations. These policies and algorithms were followed during  the patient's care.

## 2021-08-07 NOTE — Telephone Encounter (Signed)
Patient called to cancel appointment today reporting "I'm too sick to come in". Has had nausea (no vomiting) w/diarrhea for over 1 week. Not eating and just taking in small amounts of fluid. Takes Imodium after 2nd loose stool and it stops it for remainder of day. Stays in bed all day. Denies fever. Extreme fatigue and weakness. She is thinking about going to the emergency room. Encouraged her to go to ER today to be seen--doubtful she will improve without intervention if she has been ill for over a week. She agrees to have her husband take her to ER today.

## 2021-08-07 NOTE — Subjective & Objective (Signed)
Nausea diarrhea, poor PO intake for few wks Was prescribed Zofran no help

## 2021-08-07 NOTE — ED Provider Triage Note (Signed)
Emergency Medicine Provider Triage Evaluation Note  Cheryl Jordan , a 81 y.o. female  was evaluated in triage.  Pt complains of abdominal pain, nausea, and diarrhea for the past week. Pt has hx of cirrhosis. Last had paracentesis last week. Does not feel like her abdomen is more distended than normal. Denies any recent sick contacts.  Review of Systems  Positive: + abd pain, nausea, diarrhea, chills Negative: - fevers, vomiting  Physical Exam  BP 133/77 (BP Location: Left Arm)    Pulse 83    Temp 98 F (36.7 C) (Oral)    Resp 18    SpO2 99%  Gen:   Awake, no distress   Resp:  Normal effort  MSK:   Moves extremities without difficulty  Other:  Diffuse abd TTP  Medical Decision Making  Medically screening exam initiated at 2:45 PM.  Appropriate orders placed.  [name] was informed that the remainder of the evaluation will be completed by another provider, this initial triage assessment does not replace that evaluation, and the importance of remaining in the ED until their evaluation is complete.     Eustaquio Maize, PA-C 08/07/21 1448

## 2021-08-08 ENCOUNTER — Other Ambulatory Visit: Payer: Self-pay

## 2021-08-08 ENCOUNTER — Ambulatory Visit: Payer: Medicare Other | Admitting: Oncology

## 2021-08-08 ENCOUNTER — Telehealth: Payer: Self-pay | Admitting: *Deleted

## 2021-08-08 ENCOUNTER — Inpatient Hospital Stay (HOSPITAL_COMMUNITY): Payer: Medicare Other

## 2021-08-08 ENCOUNTER — Encounter (HOSPITAL_COMMUNITY): Payer: Self-pay | Admitting: Internal Medicine

## 2021-08-08 ENCOUNTER — Other Ambulatory Visit: Payer: Medicare Other

## 2021-08-08 DIAGNOSIS — R197 Diarrhea, unspecified: Secondary | ICD-10-CM | POA: Diagnosis present

## 2021-08-08 DIAGNOSIS — E43 Unspecified severe protein-calorie malnutrition: Secondary | ICD-10-CM | POA: Diagnosis present

## 2021-08-08 LAB — URINALYSIS, ROUTINE W REFLEX MICROSCOPIC
Bilirubin Urine: NEGATIVE
Glucose, UA: NEGATIVE mg/dL
Hgb urine dipstick: NEGATIVE
Ketones, ur: NEGATIVE mg/dL
Nitrite: NEGATIVE
Protein, ur: NEGATIVE mg/dL
Specific Gravity, Urine: 1.009 (ref 1.005–1.030)
pH: 5 (ref 5.0–8.0)

## 2021-08-08 LAB — CBC WITH DIFFERENTIAL/PLATELET
Abs Immature Granulocytes: 0.01 10*3/uL (ref 0.00–0.07)
Basophils Absolute: 0 10*3/uL (ref 0.0–0.1)
Basophils Relative: 0 %
Eosinophils Absolute: 0.1 10*3/uL (ref 0.0–0.5)
Eosinophils Relative: 1 %
HCT: 27.6 % — ABNORMAL LOW (ref 36.0–46.0)
Hemoglobin: 8.5 g/dL — ABNORMAL LOW (ref 12.0–15.0)
Immature Granulocytes: 0 %
Lymphocytes Relative: 27 %
Lymphs Abs: 1.3 10*3/uL (ref 0.7–4.0)
MCH: 29.4 pg (ref 26.0–34.0)
MCHC: 30.8 g/dL (ref 30.0–36.0)
MCV: 95.5 fL (ref 80.0–100.0)
Monocytes Absolute: 0.3 10*3/uL (ref 0.1–1.0)
Monocytes Relative: 7 %
Neutro Abs: 3.3 10*3/uL (ref 1.7–7.7)
Neutrophils Relative %: 65 %
Platelets: 167 10*3/uL (ref 150–400)
RBC: 2.89 MIL/uL — ABNORMAL LOW (ref 3.87–5.11)
RDW: 15.9 % — ABNORMAL HIGH (ref 11.5–15.5)
WBC: 5 10*3/uL (ref 4.0–10.5)
nRBC: 0 % (ref 0.0–0.2)

## 2021-08-08 LAB — CK: Total CK: 20 U/L — ABNORMAL LOW (ref 38–234)

## 2021-08-08 LAB — MAGNESIUM
Magnesium: 2.4 mg/dL (ref 1.7–2.4)
Magnesium: 2.5 mg/dL — ABNORMAL HIGH (ref 1.7–2.4)

## 2021-08-08 LAB — COMPREHENSIVE METABOLIC PANEL
ALT: 9 U/L (ref 0–44)
AST: 17 U/L (ref 15–41)
Albumin: 2.8 g/dL — ABNORMAL LOW (ref 3.5–5.0)
Alkaline Phosphatase: 88 U/L (ref 38–126)
Anion gap: 10 (ref 5–15)
BUN: 51 mg/dL — ABNORMAL HIGH (ref 8–23)
CO2: 20 mmol/L — ABNORMAL LOW (ref 22–32)
Calcium: 8.3 mg/dL — ABNORMAL LOW (ref 8.9–10.3)
Chloride: 107 mmol/L (ref 98–111)
Creatinine, Ser: 4.09 mg/dL — ABNORMAL HIGH (ref 0.44–1.00)
GFR, Estimated: 11 mL/min — ABNORMAL LOW (ref >60)
Glucose, Bld: 104 mg/dL — ABNORMAL HIGH (ref 70–99)
Potassium: 4.2 mmol/L (ref 3.5–5.1)
Sodium: 137 mmol/L (ref 135–145)
Total Bilirubin: 1.4 mg/dL — ABNORMAL HIGH (ref 0.3–1.2)
Total Protein: 5.6 g/dL — ABNORMAL LOW (ref 6.5–8.1)

## 2021-08-08 LAB — SODIUM, URINE, RANDOM: Sodium, Ur: 14 mmol/L

## 2021-08-08 LAB — CREATININE, URINE, RANDOM: Creatinine, Urine: 92.19 mg/dL

## 2021-08-08 LAB — GLUCOSE, CAPILLARY
Glucose-Capillary: 115 mg/dL — ABNORMAL HIGH (ref 70–99)
Glucose-Capillary: 67 mg/dL — ABNORMAL LOW (ref 70–99)
Glucose-Capillary: 74 mg/dL (ref 70–99)
Glucose-Capillary: 82 mg/dL (ref 70–99)
Glucose-Capillary: 88 mg/dL (ref 70–99)

## 2021-08-08 LAB — HEPATIC FUNCTION PANEL
ALT: 10 U/L (ref 0–44)
AST: 15 U/L (ref 15–41)
Albumin: 2.8 g/dL — ABNORMAL LOW (ref 3.5–5.0)
Alkaline Phosphatase: 86 U/L (ref 38–126)
Bilirubin, Direct: 0.5 mg/dL — ABNORMAL HIGH (ref 0.0–0.2)
Indirect Bilirubin: 0.9 mg/dL (ref 0.3–0.9)
Total Bilirubin: 1.4 mg/dL — ABNORMAL HIGH (ref 0.3–1.2)
Total Protein: 5.5 g/dL — ABNORMAL LOW (ref 6.5–8.1)

## 2021-08-08 LAB — OSMOLALITY: Osmolality: 302 mOsm/kg — ABNORMAL HIGH (ref 275–295)

## 2021-08-08 LAB — OSMOLALITY, URINE: Osmolality, Ur: 292 mOsm/kg — ABNORMAL LOW (ref 300–900)

## 2021-08-08 LAB — LACTIC ACID, PLASMA: Lactic Acid, Venous: 1.2 mmol/L (ref 0.5–1.9)

## 2021-08-08 LAB — PHOSPHORUS
Phosphorus: 4.9 mg/dL — ABNORMAL HIGH (ref 2.5–4.6)
Phosphorus: 5 mg/dL — ABNORMAL HIGH (ref 2.5–4.6)

## 2021-08-08 LAB — PROTIME-INR
INR: 1.3 — ABNORMAL HIGH (ref 0.8–1.2)
Prothrombin Time: 16 seconds — ABNORMAL HIGH (ref 11.4–15.2)

## 2021-08-08 LAB — TSH: TSH: 2.042 u[IU]/mL (ref 0.350–4.500)

## 2021-08-08 LAB — CBG MONITORING, ED: Glucose-Capillary: 82 mg/dL (ref 70–99)

## 2021-08-08 MED ORDER — ACETAMINOPHEN 650 MG RE SUPP: 650.0000 mg | Freq: Four times a day (QID) | RECTAL | Status: DC | PRN

## 2021-08-08 MED ORDER — BOOST / RESOURCE BREEZE PO LIQD CUSTOM
1.0000 | Freq: Three times a day (TID) | ORAL | Status: DC
  Administered 2021-08-08 – 2021-08-09 (×4): 1 via ORAL

## 2021-08-08 MED ORDER — ACETAMINOPHEN 325 MG PO TABS: 650.0000 mg | ORAL_TABLET | Freq: Four times a day (QID) | ORAL | Status: DC | PRN

## 2021-08-08 MED ORDER — IOHEXOL 9 MG/ML PO SOLN
500.0000 mL | ORAL | Status: AC
  Administered 2021-08-08: 13:00:00 500 mL via ORAL

## 2021-08-08 MED ORDER — LORAZEPAM 0.5 MG PO TABS
0.5000 mg | ORAL_TABLET | Freq: Two times a day (BID) | ORAL | Status: DC | PRN
  Administered 2021-08-08 – 2021-08-11 (×5): 0.5 mg via ORAL
  Filled 2021-08-08 (×5): qty 1

## 2021-08-08 MED ORDER — DEXTROSE IN LACTATED RINGERS 5 % IV SOLN: INTRAVENOUS | Status: DC

## 2021-08-08 MED ORDER — ONDANSETRON HCL 4 MG/2ML IJ SOLN
4.0000 mg | Freq: Four times a day (QID) | INTRAMUSCULAR | Status: DC | PRN
  Administered 2021-08-08: 09:00:00 4 mg via INTRAVENOUS
  Filled 2021-08-08: qty 2

## 2021-08-08 MED ORDER — PROPRANOLOL HCL 20 MG PO TABS: 20.0000 mg | ORAL_TABLET | Freq: Two times a day (BID) | ORAL | Status: DC

## 2021-08-08 MED ORDER — IOHEXOL 9 MG/ML PO SOLN
ORAL | Status: AC
  Administered 2021-08-08: 13:00:00 500 mL
  Filled 2021-08-08: qty 1000

## 2021-08-08 MED ORDER — ALBUMIN HUMAN 25 % IV SOLN
12.5000 g | Freq: Four times a day (QID) | INTRAVENOUS | Status: AC
  Administered 2021-08-08 – 2021-08-09 (×4): 12.5 g via INTRAVENOUS
  Filled 2021-08-08 (×4): qty 50

## 2021-08-08 MED ORDER — PROCHLORPERAZINE EDISYLATE 10 MG/2ML IJ SOLN
10.0000 mg | Freq: Four times a day (QID) | INTRAMUSCULAR | Status: DC | PRN
  Administered 2021-08-08: 12:00:00 10 mg via INTRAVENOUS
  Filled 2021-08-08: qty 2

## 2021-08-08 MED ORDER — SODIUM CHLORIDE 0.9 % IV SOLN
75.0000 mL/h | INTRAVENOUS | Status: DC
  Administered 2021-08-08: 02:00:00 75 mL/h via INTRAVENOUS

## 2021-08-08 MED ORDER — LEVOTHYROXINE SODIUM 88 MCG PO TABS
88.0000 ug | ORAL_TABLET | Freq: Every day | ORAL | Status: DC
  Administered 2021-08-08 – 2021-08-11 (×3): 88 ug via ORAL
  Filled 2021-08-08 (×3): qty 1

## 2021-08-08 NOTE — Progress Notes (Signed)
Initial Nutrition Assessment  DOCUMENTATION CODES:   Severe malnutrition in context of chronic illness  INTERVENTION:   -Needs weight for admission  -Boost Breeze po TID, each supplement provides 250 kcal and 9 grams of protein  -Once diet advanced: Boost Plus + ice cream  NUTRITION DIAGNOSIS:   Severe Malnutrition related to chronic illness (colon cancer, cirrhosis) as evidenced by percent weight loss, moderate fat depletion, severe muscle depletion, energy intake < or equal to 75% for > or equal to 1 month.  GOAL:   Patient will meet greater than or equal to 90% of their needs  MONITOR:   PO intake, Supplement acceptance, Weight trends, I & O's, Labs  REASON FOR ASSESSMENT:   Consult Assessment of nutrition requirement/status  ASSESSMENT:   81 y.o. female with medical history significant of DM2, cirrhosis, colon cancer   Admitted for AKI on CKD.  Patient reports continued nausea. She is trying to sip on a cola to help settle her stomach. States when she vomits, it is a little liquid. Pt last ate a biscuit yesterday, states she had terrible nausea but did not vomit. States she has reflux as well.   She has had poor PO x weeks but states she has been "sick" for the past year.  Reports Zofran never helped at home.  Has recurrent paracentesis, last 1/17.  Pt states she doesn't drink fluids like she should. When she is not eating she drinks Gatorade but doesn't ever feel thirsty.   Had BMs this morning x 3.  Once nausea under better control, willing to try Boost mixed with ice cream.  RN brought in a new nausea medication for pt during visit.  Per weight records, last recorded weight is 191 lbs on 06/13/21. Needs weight for this admission. States last time she weighed she weighed ~170 lbs.  Medications: D5 infusion, IV Zofran, Compazine  Labs reviewed: CBGs: 67-115 Phos 4.9 Mg 2.5  NUTRITION - FOCUSED PHYSICAL EXAM:  Flowsheet Row Most Recent Value  Orbital  Region No depletion  Upper Arm Region Moderate depletion  Thoracic and Lumbar Region Moderate depletion  Buccal Region Moderate depletion  Temple Region Moderate depletion  Clavicle Bone Region Severe depletion  Clavicle and Acromion Bone Region Severe depletion  Scapular Bone Region Severe depletion  Dorsal Hand Severe depletion  Patellar Region Mild depletion  Anterior Thigh Region Mild depletion  Posterior Calf Region Mild depletion  Edema (RD Assessment) None  Hair Reviewed  Eyes Reviewed  Mouth Reviewed  Skin Reviewed       Diet Order:   Diet Order             Diet clear liquid Room service appropriate? Yes; Fluid consistency: Thin  Diet effective now                   EDUCATION NEEDS:   Education needs have been addressed  Skin:  Skin Assessment: Reviewed RN Assessment  Last BM:  1/24  Height:   Ht Readings from Last 1 Encounters:  06/13/21 5\' 7"  (1.702 m)    Weight:   Wt Readings from Last 1 Encounters:  06/13/21 86.9 kg    BMI:  There is no height or weight on file to calculate BMI.  Estimated Nutritional Needs:   Kcal:  1800-2000  Protein:  85-100g  Fluid:  2L/day  Clayton Bibles, MS, RD, LDN Inpatient Clinical Dietitian Contact information available via Amion

## 2021-08-08 NOTE — Assessment & Plan Note (Signed)
Order sliding scale monitor CBG

## 2021-08-08 NOTE — Assessment & Plan Note (Signed)
Chronic stable patient does not drink alcohol.  Reports she is gets regular paracenteses once a week last one was last week.  No evidence of severe ascites currently no abdominal pelvic pain to suggest SBP. MELD-Na score: 23 at 08/08/2021  2:21 AM No evidence of asterixis

## 2021-08-08 NOTE — Progress Notes (Signed)
°  Progress Note   [name] HDQ:[id number] DOB: 01/09/1941 DOA: 08/07/2021     1 DOS: the patient was seen and examined on 08/08/2021   Brief hospital course: Past medical history of cirrhosis of the liver with recurrent paracentesis, type 2 diabetes mellitus, colon cancer.  Presents with complaints of abdominal pain and diarrhea with nausea and vomiting.  Found to have AKI.  Assessment and Plan Acute kidney injury on chronic kidney disease stage IIIa. Likely from poor p.o. intake in the setting of diarrhea as well as nausea and vomiting. To baseline serum creatinine around 2. On presentation serum creatinine around 4. Received IV fluids. Will monitor. Currently will treat with IV albumin. Will initiate further work-up if renal function does not improve including nephrology consultation as there is concern for hepatorenal syndrome.  Liver cirrhosis. Patient receives weekly paracentesis with 7 L removed. Currently in the setting of worsening renal function I would avoid large volume paracentesis. Albumin support. Monitor. Meld score 23.  Intractable nausea and vomiting. Etiology not clear. Will get CT abdomen pelvis for further etiology work-up. Check C. difficile for diarrhea history.  History of colon cancer. Follows up with outpatient oncology. Follow-up on the results of the CT scan of the abdomen.  Hypothyroidism blood Continue current medication.  Essential hypertension. Monitor on current medication.  Severe protein calorie malnutrition. Appreciate dietitian consultation. Continue supplements.    Subjective: Continues to report nausea.  Also reports abdominal distention.  No abdominal pain.  No diarrhea.  No blood in the stool.  Objective Vitals:   08/08/21 1000 08/08/21 1048 08/08/21 1413 08/08/21 1944  BP:  136/68 (!) 148/80 137/66  Pulse:  81 96 90  Resp:  15 17 18   Temp:  97.7 F (36.5 C) (!) 97.4 F (36.3 C) 98 F (36.7 C)  TempSrc:  Oral  Oral Oral  SpO2: 98% 99% 98% 95%    General: Appear in mild distress, no Rash; Oral Mucosa Clear, moist. no Abnormal Neck Mass Or lumps, Conjunctiva normal  Cardiovascular: S1 and S2 Present, no Murmur, Respiratory: good respiratory effort, Bilateral Air entry present and CTA, no Crackles, no wheezes Abdomen: Bowel Sound present, Soft, distended and no tenderness Extremities: no Pedal edema Neurology: alert and oriented to time, place, and person affect appropriate. no new focal deficit Gait not checked due to patient safety concerns    Data Reviewed:  My review of labs, imaging, notes and other tests shows no new significant findings.  I ordered repeat labs for tomorrow.  Family Communication: None at bedside  Disposition: Status is: Inpatient  Remains inpatient appropriate because: Ongoing poor p.o. intake in the setting of nausea vomiting as well as AKI.     Author: Berle Mull, MD 08/08/2021 7:55 PM  For on call review [email or web address].

## 2021-08-08 NOTE — Assessment & Plan Note (Signed)
Restart propranolol and monitor blood pressure

## 2021-08-08 NOTE — Assessment & Plan Note (Signed)
-  chronic avoid nephrotoxic medications such as NSAIDs, Vanco Zosyn combo,  avoid hypotension, continue to follow renal function  

## 2021-08-08 NOTE — Assessment & Plan Note (Addendum)
Acute on chronic renal failure in the setting of dehydration and diarrhea will rehydrate and follow Obtain urine electrolytes

## 2021-08-08 NOTE — Telephone Encounter (Signed)
Oncology Discharge Planning Admission Note  [facility] Address: [address], [place], [place] [address] Hours of Operation:  8am - 5pm, Monday - Friday  Clinic Contact Information:  [phone]) [phone]  Oncology Care Team: Medical Oncologist:  Dr. Harrietta Guardian  Contacted  husband, Cheryl Jordan  to inform that the oncology provider Dr. Benay Spice  is aware of this hospital admission dated 08/07/21, and the cancer center will follow Cheryl Jordan inpatient care to assist with discharge planning as indicated by the oncologist.  We will reach out to you closer to discharge date to arrange your follow up care.  Disclaimer:  This [place] note does not imply a formal consult request has been made by the admitting attending for this admission or there will be an inpatient consult completed by oncology.  Please request oncology consults as per standard process as indicated.

## 2021-08-08 NOTE — Evaluation (Signed)
Occupational Therapy Evaluation Patient Details Name: Cheryl Jordan MRN: [id number] DOB: 03-11-1941 Today's Date: 08/08/2021   History of Present Illness Patient is a 81 year old female admitted with c/o nausea, poor p.o. intake and episodes of diarrhea for few weeks. PMH: DM2, cirrhosis, colon cancer   Clinical Impression   Patient lives at home with spouse and is mod I at baseline. Patient demonstrates ability to perform ADL tasks of LB dressing, functional ambulation and transfers with use of cane and no physical assist needed. Encourage patient to sit up to chair for meals and ambulate to bathroom with nursing staff to maintain strength during acute hospitalization. No further acute OT needs, will sign off.       Recommendations for follow up therapy are one component of a multi-disciplinary discharge planning process, led by the attending physician.  Recommendations may be updated based on patient status, additional functional criteria and insurance authorization.   Follow Up Recommendations  No OT follow up    Assistance Recommended at Discharge None     Functional Status Assessment  Patient has not had a recent decline in their functional status  Equipment Recommendations  None recommended by OT       Precautions / Restrictions Restrictions Weight Bearing Restrictions: No      Mobility Bed Mobility Overal bed mobility: Modified Independent                  Transfers Overall transfer level: Modified independent Equipment used: Straight cane                      Balance Overall balance assessment: Mild deficits observed, not formally tested                                         ADL either performed or assessed with clinical judgement   ADL Overall ADL's : Modified independent                                       General ADL Comments: Patient is able to perform lower body dressing, functional transfers and  ambulation in her room with cane and no physical assistance.     Vision Baseline Vision/History: 1 Wears glasses              Pertinent Vitals/Pain Pain Assessment Pain Assessment: Faces Faces Pain Scale: Hurts little more Pain Location: Abdomen Pain Descriptors / Indicators: Aching, Nagging (nausea) Pain Intervention(s): Monitored during session        Extremity/Trunk Assessment Upper Extremity Assessment Upper Extremity Assessment: Overall WFL for tasks assessed   Lower Extremity Assessment Lower Extremity Assessment: Defer to PT evaluation   Cervical / Trunk Assessment Cervical / Trunk Assessment: Normal   Communication Communication Communication: No difficulties   Cognition Arousal/Alertness: Awake/alert Behavior During Therapy: WFL for tasks assessed/performed Overall Cognitive Status: Within Functional Limits for tasks assessed                                                  Home Living Family/patient expects to be discharged to:: Private residence Living Arrangements: Spouse/significant other Available Help at Discharge: Family;Available 24 hours/day  Type of Home: House Home Access: Stairs to enter [employer] of Steps: 1-2   Home Layout: One level     Bathroom Shower/Tub: Occupational psychologist: Handicapped height     Home Equipment: King (2 wheels);Cane - single point          Prior Functioning/Environment Prior Level of Function : Independent/Modified Independent             Mobility Comments: Uses cane for community mobility, furniture cruises in home          OT Problem List: Decreased activity tolerance         OT Goals(Current goals can be found in the care plan section) Acute Rehab OT Goals Patient Stated Goal: get rid of nausea OT Goal Formulation: All assessment and education complete, DC therapy   AM-PAC OT "6 Clicks" Daily Activity      Outcome Measure Help from another person eating meals?: None Help from another person taking care of personal grooming?: None Help from another person toileting, which includes using toliet, bedpan, or urinal?: None Help from another person bathing (including washing, rinsing, drying)?: None Help from another person to put on and taking off regular upper body clothing?: None Help from another person to put on and taking off regular lower body clothing?: None 6 Click Score: 24   End of Session Equipment Utilized During Treatment:  (cane) Nurse Communication: Mobility status  Activity Tolerance: Patient tolerated treatment well Patient left: in chair;with call bell/phone within reach  OT Visit Diagnosis: Other abnormalities of gait and mobility (R26.89)                Time: 8003-4917 OT Time Calculation (min): 16 min Charges:  OT General Charges $OT Visit: 1 Visit OT Evaluation $OT Eval Low Complexity: 1 Low  Delbert Phenix OT OT pager: [place] 08/08/2021, 11:04 AM

## 2021-08-08 NOTE — Progress Notes (Signed)
PT Cancellation Note  Patient Details Name: Cheryl Jordan MRN: [id number] DOB: 06/11/1941   Cancelled Treatment:    Reason Eval/Treat Not Completed: PT screened, no needs identified, will sign off (Patient worked with OT and is mobilizing at mod independent level with [place]. no skilled PT needs in acute setting. Will sign off at this time.)  Verner Mould, [place] Office [phone] Pager [phone]

## 2021-08-08 NOTE — Plan of Care (Signed)
  Problem: Education: Goal: Knowledge of General Education information will improve Description Including pain rating scale, medication(s)/side effects and non-pharmacologic comfort measures Outcome: Progressing   Problem: Health Behavior/Discharge Planning: Goal: Ability to manage health-related needs will improve Outcome: Progressing   

## 2021-08-08 NOTE — Assessment & Plan Note (Signed)
Followed by Dr. Lucendia Herrlich as an outpatient.  Reports has been stable

## 2021-08-08 NOTE — Assessment & Plan Note (Signed)
Reports somewhat improving with Imodium.  No antibiotic use.  Ordered C. difficile and GI panel

## 2021-08-08 NOTE — Assessment & Plan Note (Signed)
Chronic-stable.

## 2021-08-08 NOTE — Progress Notes (Signed)
Inpatient Diabetes Program Recommendations  AACE/ADA: New Consensus Statement on Inpatient Glycemic Control (2015)  Target Ranges:  Prepandial:   less than 140 mg/dL      Peak postprandial:   less than 180 mg/dL (1-2 hours)      Critically ill patients:  140 - 180 mg/dL   Lab Results  Component Value Date   GLUCAP 115 (H) 08/08/2021   HGBA1C 5.1 03/02/2021    Review of Glycemic Control  Latest Reference Range & Units 03/08/21 12:06 08/08/21 00:32 08/08/21 04:27 08/08/21 07:53 08/08/21 12:31  Glucose-Capillary 70 - 99 mg/dL 109 (H) 82 74 67 (L) 115 (H)  (H): Data is abnormally high (L): Data is abnormally low  Diabetes history: DM2 Outpatient Diabetes medications: Glipizide 5 mg with BF Current orders for Inpatient glycemic control: None  Referral received for uncontrolled DM.  Currently not on any DM medications inpatient.    Will continue to follow while inpatient.  Thank you, Reche Dixon, MSN, RN Diabetes Coordinator Inpatient Diabetes Program [phone] (team pager from 8a-5p)

## 2021-08-08 NOTE — Assessment & Plan Note (Signed)
-   Check TSH continue home medications at current dose ° °

## 2021-08-09 DIAGNOSIS — D61818 Other pancytopenia: Secondary | ICD-10-CM | POA: Diagnosis present

## 2021-08-09 DIAGNOSIS — R112 Nausea with vomiting, unspecified: Secondary | ICD-10-CM | POA: Diagnosis present

## 2021-08-09 LAB — COMPREHENSIVE METABOLIC PANEL
ALT: 9 U/L (ref 0–44)
AST: 15 U/L (ref 15–41)
Albumin: 2.8 g/dL — ABNORMAL LOW (ref 3.5–5.0)
Alkaline Phosphatase: 66 U/L (ref 38–126)
Anion gap: 8 (ref 5–15)
BUN: 47 mg/dL — ABNORMAL HIGH (ref 8–23)
CO2: 19 mmol/L — ABNORMAL LOW (ref 22–32)
Calcium: 8.3 mg/dL — ABNORMAL LOW (ref 8.9–10.3)
Chloride: 111 mmol/L (ref 98–111)
Creatinine, Ser: 3.37 mg/dL — ABNORMAL HIGH (ref 0.44–1.00)
GFR, Estimated: 13 mL/min — ABNORMAL LOW (ref >60)
Glucose, Bld: 86 mg/dL (ref 70–99)
Potassium: 4 mmol/L (ref 3.5–5.1)
Sodium: 138 mmol/L (ref 135–145)
Total Bilirubin: 1.2 mg/dL (ref 0.3–1.2)
Total Protein: 4.9 g/dL — ABNORMAL LOW (ref 6.5–8.1)

## 2021-08-09 LAB — CBC WITH DIFFERENTIAL/PLATELET
Abs Immature Granulocytes: 0.01 10*3/uL (ref 0.00–0.07)
Basophils Absolute: 0 10*3/uL (ref 0.0–0.1)
Basophils Relative: 1 %
Eosinophils Absolute: 0 10*3/uL (ref 0.0–0.5)
Eosinophils Relative: 1 %
HCT: 21.8 % — ABNORMAL LOW (ref 36.0–46.0)
Hemoglobin: 6.7 g/dL — CL (ref 12.0–15.0)
Immature Granulocytes: 0 %
Lymphocytes Relative: 31 %
Lymphs Abs: 1 10*3/uL (ref 0.7–4.0)
MCH: 29.9 pg (ref 26.0–34.0)
MCHC: 30.7 g/dL (ref 30.0–36.0)
MCV: 97.3 fL (ref 80.0–100.0)
Monocytes Absolute: 0.3 10*3/uL (ref 0.1–1.0)
Monocytes Relative: 8 %
Neutro Abs: 2 10*3/uL (ref 1.7–7.7)
Neutrophils Relative %: 59 %
Platelets: 122 10*3/uL — ABNORMAL LOW (ref 150–400)
RBC: 2.24 MIL/uL — ABNORMAL LOW (ref 3.87–5.11)
RDW: 15.4 % (ref 11.5–15.5)
WBC: 3.4 10*3/uL — ABNORMAL LOW (ref 4.0–10.5)
nRBC: 0 % (ref 0.0–0.2)

## 2021-08-09 LAB — GASTROINTESTINAL PANEL BY PCR, STOOL (REPLACES STOOL CULTURE)

## 2021-08-09 LAB — GLUCOSE, CAPILLARY
Glucose-Capillary: 86 mg/dL (ref 70–99)
Glucose-Capillary: 97 mg/dL (ref 70–99)
Glucose-Capillary: 112 mg/dL — ABNORMAL HIGH (ref 70–99)
Glucose-Capillary: 84 mg/dL (ref 70–99)
Glucose-Capillary: 141 mg/dL — ABNORMAL HIGH (ref 70–99)

## 2021-08-09 LAB — HEMOGLOBIN A1C
Hgb A1c MFr Bld: 4.8 % (ref 4.8–5.6)
Mean Plasma Glucose: 91 mg/dL

## 2021-08-09 LAB — HEMOGLOBIN AND HEMATOCRIT, BLOOD
HCT: 27.4 % — ABNORMAL LOW (ref 36.0–46.0)
Hemoglobin: 8.7 g/dL — ABNORMAL LOW (ref 12.0–15.0)

## 2021-08-09 LAB — C DIFFICILE QUICK SCREEN W PCR REFLEX
C Diff antigen: NEGATIVE
C Diff interpretation: NOT DETECTED
C Diff toxin: NEGATIVE

## 2021-08-09 LAB — MAGNESIUM: Magnesium: 2.3 mg/dL (ref 1.7–2.4)

## 2021-08-09 LAB — PREPARE RBC (CROSSMATCH)

## 2021-08-09 MED ORDER — PANTOPRAZOLE SODIUM 40 MG IV SOLR
40.0000 mg | Freq: Two times a day (BID) | INTRAVENOUS | Status: DC
  Administered 2021-08-09 – 2021-08-10 (×3): 40 mg via INTRAVENOUS
  Filled 2021-08-09 (×3): qty 40

## 2021-08-09 MED ORDER — LOPERAMIDE HCL 2 MG PO CAPS
2.0000 mg | ORAL_CAPSULE | ORAL | Status: DC | PRN
  Administered 2021-08-09: 12:00:00 2 mg via ORAL
  Filled 2021-08-09: qty 1

## 2021-08-09 MED ORDER — SODIUM CHLORIDE 0.9% IV SOLUTION: Freq: Once | INTRAVENOUS | Status: AC

## 2021-08-09 NOTE — Assessment & Plan Note (Deleted)
Associated with pancytopenia. Suspect this is dilutional in nature. Although patient does report some dark stool and therefore GI consulted. Continue PPI. Appreciate GI consultation. EGD shows no evidence of acute bleeding.

## 2021-08-09 NOTE — Assessment & Plan Note (Signed)
CT shows no evidence of acute worsening.

## 2021-08-09 NOTE — Progress Notes (Incomplete)
Date and time results received: 08/09/21 0600   Test:Hemoglobin Critical Value: 6.7  Name of Provider Notified: Clarene Essex  Orders Received? Or Actions Taken?:  awaiting new orders

## 2021-08-09 NOTE — Consult Note (Addendum)
Referring Provider: Dr. Posey Pronto, [facility] Primary Care Physician:  Wannetta Sender, FNP Primary Gastroenterologist:  Dr. Henrene Pastor  Reason for Consultation:  Cirrhosis, ascites  HPI: Cheryl Jordan is a 81 y.o. 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  Presented to [facility] ED with complaints of abdominal pain and diarrhea with nausea and vomiting.  Found to have AKI.  Cr is improving while here.  She tells me that these issues have been present for a while.  She tells me that Dr. Benay Spice gave her prednisone a couple of weeks ago to try to help treat her symptoms, she started having dark stools.  She says that her stools have been quite dark since then.  Hemoglobin is down to 6.7 g this morning and she was transfused with a unit of packed red blood cells.  She says that she actually feels the best today than she has in quite some time.  Did have diarrhea this morning.  She uses Imodium at home to try to manage that.  She has tried Zofran quite regularly that has been prescribed by Dr. Henrene Pastor, but she says that that does not seem to work.  Stool studies here were negative for infectious source.  Primary question from hospitalist was how to manage her ascites in the setting with these symptoms and the acute kidney injury, etc.  CT of the abdomen and pelvis without contrast:   IMPRESSION: 1. No bowel obstruction, enteric contrast reaches the colon. 2. Equivocal Hayne thickening about the gastric cardia and gastric body, can be seen with gastritis or peptic ulcer disease. 3. Cirrhosis with portal hypertension, splenomegaly, and moderate volume abdominopelvic ascites. Low-density in the right hepatic lobe corresponds to ablation defect on prior MRI. The additional 1 cm right lobe liver lesion on MRI is not well seen. Limited assessment for liver lesions on this unenhanced exam. 4. Chronic left pleural  effusion, increased from prior abdominal CT. Trace right pleural effusion, new from prior. 5. Minimal sigmoid diverticulosis without diverticulitis. 6. Bilateral renal parenchymal thinning and atrophy with multiple renal cysts. No hydronephrosis.   Aortic Atherosclerosis (ICD10-I70.0).  Colonoscopy 06/2020:  - Scar at the colonic anastomosis. Clip (MR conditional) was placed. - Post-polypectomy scar in the rectum. Clips (MR conditional) were placed. - The examination was otherwise normal. - No specimens collected.  EGD 06/2020:  - Z-line regular, 35 cm from the incisors. - No gross lesions in esophagus. - Portal hypertensive gastropathy - Portal hypertensive gastropathy. - Clotted blood in the gastric antrum and in the prepyloric region of the stomach. - Gastric antral vascular ectasia with bleeding. Treated with argon plasma coagulation (APC). Clips (MR conditional) were placed. - Normal examined duodenum. - No specimens collected.  A. DUODENUM, BIOPSY:  - Unremarkable duodenal mucosa.  - No features of celiac sprue or granulomas.   B. STOMACH, BIOPSY:  - Unremarkable gastric mucosa.  - Warthin-Starry negative for Helicobacter pylori.  - No intestinal metaplasia, dysplasia or carcinoma.   C. COLON, TRANSVERSE, DESCENDING, SIGMOID, RECTUM, POLYPECTOMY:  - Tubular adenoma (1).  - No high-grade dysplasia or carcinoma.  - Hyperplastic polyp (s).   D. RECTUM, BIOPSY:  - Unremarkable colonic mucosa with benign lymphoid aggregates.  - No active inflammation, chronic changes or granulomas.   Past Medical History:  Diagnosis Date   Allergy    Anemia    hx of   Anxiety  Aortic stenosis    mild by 04/08/20 echo   Arthritis    Colon cancer ([place]) dx'd 11/2014   Diabetes mellitus without complication (HCC)    TYPE II   Dyspnea    Fatty liver    Gallbladder disease    GERD (gastroesophageal reflux disease)    Heart murmur    History of gout    Hypertension     Hypothyroidism    Kidney disorder    spot on left kidney   Osteoporosis    PONV (postoperative nausea and vomiting)     Past Surgical History:  Procedure Laterality Date   BIOPSY  06/17/2020   Procedure: BIOPSY;  Surgeon: Irving Copas., MD;  Location: [facility];  Service: Gastroenterology;;   CATARACT EXTRACTION Left    CHOLECYSTECTOMY     COLONOSCOPY WITH PROPOFOL N/A 06/17/2020   Procedure: COLONOSCOPY WITH PROPOFOL;  Surgeon: Irving Copas., MD;  Location: [facility];  Service: Gastroenterology;  Laterality: N/A;   COLONOSCOPY WITH PROPOFOL N/A 07/01/2020   Procedure: COLONOSCOPY WITH PROPOFOL;  Surgeon: Mauri Pole, MD;  Location: [facility];  Service: Endoscopy;  Laterality: N/A;   ESOPHAGOGASTRODUODENOSCOPY (EGD) WITH PROPOFOL N/A 06/17/2020   Procedure: ESOPHAGOGASTRODUODENOSCOPY (EGD) WITH PROPOFOL;  Surgeon: Rush Landmark Telford Nab., MD;  Location: [facility];  Service: Gastroenterology;  Laterality: N/A;   ESOPHAGOGASTRODUODENOSCOPY (EGD) WITH PROPOFOL N/A 07/01/2020   Procedure: ESOPHAGOGASTRODUODENOSCOPY (EGD) WITH PROPOFOL;  Surgeon: Mauri Pole, MD;  Location: [facility];  Service: Endoscopy;  Laterality: N/A;   FOREIGN BODY REMOVAL  06/17/2020   Procedure: FOREIGN BODY REMOVAL;  Surgeon: Rush Landmark Telford Nab., MD;  Location: [facility];  Service: Gastroenterology;;   GI RADIOFREQUENCY ABLATION  06/17/2020   Procedure: GI RADIOFREQUENCY ABLATION;  Surgeon: Irving Copas., MD;  Location: [facility];  Service: Gastroenterology;;   HEMOSTASIS CLIP PLACEMENT  06/17/2020   Procedure: HEMOSTASIS CLIP PLACEMENT;  Surgeon: Irving Copas., MD;  Location: [facility];  Service: Gastroenterology;;   HEMOSTASIS CLIP PLACEMENT  07/01/2020   Procedure: HEMOSTASIS CLIP PLACEMENT;  Surgeon: Mauri Pole, MD;  Location: [facility];  Service: Endoscopy;;   HOT HEMOSTASIS N/A 07/01/2020   Procedure: HOT HEMOSTASIS  (ARGON PLASMA COAGULATION/BICAP);  Surgeon: Mauri Pole, MD;  Location: [facility];  Service: Endoscopy;  Laterality: N/A;   IR PARACENTESIS  08/19/2020   IR PARACENTESIS  09/26/2020   IR PARACENTESIS  11/03/2020   IR PARACENTESIS  11/28/2020   IR PARACENTESIS  12/21/2020   IR PARACENTESIS  01/17/2021   IR PARACENTESIS  02/08/2021   IR PARACENTESIS  02/23/2021   IR PARACENTESIS  03/22/2021   IR PARACENTESIS  04/06/2021   IR PARACENTESIS  04/20/2021   IR PARACENTESIS  05/03/2021   IR PARACENTESIS  05/16/2021   IR PARACENTESIS  05/29/2021   IR PARACENTESIS  06/07/2021   IR PARACENTESIS  06/15/2021   IR PARACENTESIS  06/28/2021   IR PARACENTESIS  07/05/2021   IR PARACENTESIS  07/14/2021   IR PARACENTESIS  07/21/2021   IR PARACENTESIS  08/01/2021   IR RADIOLOGIST EVAL & MGMT  01/24/2017   IR RADIOLOGIST EVAL & MGMT  02/22/2021   IR RADIOLOGIST EVAL & MGMT  03/28/2021   IR RADIOLOGIST EVAL & MGMT  06/06/2021   KNEE CARTILAGE SURGERY Bilateral    NEPHRECTOMY Left 02/10/2015   Procedure: OPEN RETROPERINTONEAL EXPLORATION LEFT RENAL  CYST DECORTICATION X 5;  Surgeon: Cleon Gustin, MD;  Location: [facility];  Service: Urology;  Laterality: Left;   PARTIAL  COLECTOMY N/A 02/10/2015   Procedure: OPEN RIGHT  COLECTOMY ;  Surgeon: [name], MD;  Location: [facility];  Service: General;  Laterality: N/A;   POLYPECTOMY  06/17/2020   Procedure: POLYPECTOMY;  Surgeon: Irving Copas., MD;  Location: [facility];  Service: Gastroenterology;;   RADIOLOGY WITH ANESTHESIA N/A 03/08/2021   Procedure: CT MICROWAVE ABLATION WITH ANESTHESIA;  Surgeon: Arne Cleveland, MD;  Location: [facility];  Service: Radiology;  Laterality: N/A;   TUBAL LIGATION      Prior to Admission medications   Medication Sig Start Date End Date Taking? Authorizing Provider  acetaminophen (TYLENOL) 500 MG tablet Take 500 mg by mouth daily as needed for moderate pain.   Yes [provider]  amLODipine (NORVASC)  2.5 MG tablet Take 1 tablet (2.5 mg total) by mouth daily. 12/04/19  Yes Brunetta Jeans, PA-C  furosemide (LASIX) 40 MG tablet Take 1.5 tablets (60 mg total) by mouth daily. Patient taking differently: Take 40 mg by mouth daily. 09/21/20  Yes [name], MD  glipiZIDE (GLUCOTROL XL) 5 MG 24 hr tablet Take 5 mg by mouth daily with breakfast.   Yes [provider]  levothyroxine (SYNTHROID) 88 MCG tablet Take 1 tablet (88 mcg total) by mouth daily. 12/04/19  Yes Brunetta Jeans, PA-C  loperamide (IMODIUM) 2 MG capsule Take 2 mg by mouth as needed for diarrhea or loose stools.   Yes [provider]  LORazepam (ATIVAN) 0.5 MG tablet Take 0.5 mg by mouth 2 (two) times daily as needed for anxiety.   Yes [provider]  omeprazole (PRILOSEC) 20 MG capsule Take 1 capsule (20 mg total) by mouth 2 (two) times daily before a meal. 08/10/20  Yes Brunetta Jeans, PA-C  ondansetron (ZOFRAN) 4 MG tablet Take 1-2 tablets 20 minutes before meals Patient taking differently: Take 4-8 mg by mouth every 6 (six) hours as needed for nausea or vomiting. Take 1-2 tablets 20 minutes before meals 05/11/21  Yes [name], MD  propranolol (INDERAL) 40 MG tablet Take 20 mg by mouth 2 (two) times daily. 09/30/20  Yes [provider]  Simethicone (GAS-X PO) Take 1 tablet by mouth as needed (flatulence).   Yes [provider]  Vitamin D, Ergocalciferol, (DRISDOL) 1.25 MG (50000 UNIT) CAPS capsule Take 50,000 Units by mouth once a week. Saturday 06/01/21  Yes [provider]  [place] test strip  10/20/18   [provider]  Blood Glucose Calibration (ACCU-CHEK AVIVA) SOLN     [provider]  rivaroxaban (XARELTO) 20 MG TABS tablet Take 1 tablet (20 mg total) by mouth daily with supper. Patient not taking: Reported on 08/04/2020 07/08/20 08/10/20  Eugenie Filler, MD    Current Facility-Administered Medications  Medication Dose Route  Frequency Provider Last Rate Last Admin   acetaminophen (TYLENOL) tablet 650 mg  650 mg Oral Q6H PRN Toy Baker, MD       Or   acetaminophen (TYLENOL) suppository 650 mg  650 mg Rectal Q6H PRN Toy Baker, MD       feeding supplement (BOOST / RESOURCE BREEZE) liquid 1 Container  1 Container Oral TID BM Lavina Hamman, MD   1 Container at 08/09/21 1134   levothyroxine (SYNTHROID) tablet 88 mcg  88 mcg Oral Q0600 Toy Baker, MD   88 mcg at 08/09/21 0521   loperamide (IMODIUM) capsule 2 mg  2 mg Oral PRN Lavina Hamman, MD   2 mg at  08/09/21 1137   LORazepam (ATIVAN) tablet 0.5 mg  0.5 mg Oral BID PRN Lavina Hamman, MD   0.5 mg at 08/09/21 0841   ondansetron [facility]) injection 4 mg  4 mg Intravenous Q6H PRN Lavina Hamman, MD   4 mg at 08/08/21 2671   prochlorperazine (COMPAZINE) injection 10 mg  10 mg Intravenous Q6H PRN Lavina Hamman, MD   10 mg at 08/08/21 1144    Allergies as of 08/07/2021 - Review Complete 08/07/2021  Allergen Reaction Noted   Aspirin Other (See Comments) 10/16/2011   Codeine Nausea And Vomiting 10/16/2011    Family History  Problem Relation Age of Onset   Diabetes Mother    Hypertension Other    COPD Father    Hypertension Sister    GER disease Sister    Heart attack Brother    COPD Brother    Lung cancer Brother    COPD Brother    Hypertension Brother     Social History   Socioeconomic History   Marital status: Married    Spouse name: Not on file   Number of children: 2   Years of education: Not on file   Highest education level: Not on file  Occupational History   Occupation: Retired  Tobacco Use   Smoking status: Never   Smokeless tobacco: Never  Vaping Use   Vaping Use: Never used  Substance and Sexual Activity   Alcohol use: No   Drug use: No   Sexual activity: Never  Other Topics Concern   Not on file  Social History Narrative   03/03/15-Married, husband Jeneen Rinks   #2 grown sons and #2 grand daughters    Social Determinants of Health   Financial Resource Strain: Not on file  Food Insecurity: Not on file  Transportation Needs: Not on file  Physical Activity: Not on file  Stress: Not on file  Social Connections: Not on file  Intimate Partner Violence: Not on file    Review of Systems: ROS is O/W negative except as mentioned in HPI.  Physical Exam: Vital signs in last 24 hours: Temp:  [97.4 F (36.3 C)-98.1 F (36.7 C)] 97.7 F (36.5 C) (01/25 1204) Pulse Rate:  [81-96] 81 (01/25 1204) Resp:  [17-20] 17 (01/25 1204) BP: (115-148)/(54-80) 126/62 (01/25 1204) SpO2:  [94 %-99 %] 99 % (01/25 1204) Weight:  [84.4 kg] 84.4 kg (01/25 1000) Last BM Date: 08/08/21 General:  Alert, elderly, pleasant and cooperative in NAD Head:  Normocephalic and atraumatic. Eyes:  Sclera clear, no icterus.  Conjunctiva pink. Ears:  Normal auditory acuity. Mouth:  No deformity or lesions.   Lungs:  Clear throughout to auscultation.  No wheezes, crackles, or rhonchi.  Heart:  Regular rate and rhythm; no murmurs, clicks, rubs, or gallops. Abdomen:  Soft but distended with ascites fluid.   Msk:  Symmetrical without gross deformities. Pulses:  Normal pulses noted. Extremities:  Without clubbing or edema. Neurologic:  Alert and oriented x 4;  grossly normal neurologically. Skin:  Intact without significant lesions or rashes. Psych:  Alert and cooperative. Normal mood and affect.  Intake/Output from previous day: 01/24 0701 - 01/25 0700 In: 810.8 [P.O.:420; I.V.:190.8; IV Piggyback:200] Out: -  Intake/Output this shift: Total I/O In: 654 [P.O.:240; Blood:414] Out: -   Lab Results: Recent Labs    08/07/21 2300 08/08/21 0221 08/09/21 0512  WBC 5.0 5.0 3.4*  HGB 8.3* 8.5* 6.7*  HCT 27.3* 27.6* 21.8*  PLT 164 167 122*   BMET  Recent Labs    08/07/21 1510 08/08/21 0221 08/09/21 0512  NA 136 137 138  K 4.6 4.2 4.0  CL 108 107 111  CO2 21* 20* 19*  GLUCOSE 76 104* 86  BUN 52* 51* 47*   CREATININE 4.19* 4.09* 3.37*  CALCIUM 8.4* 8.3* 8.3*   LFT Recent Labs    08/08/21 0221 08/09/21 0512  PROT 5.5*   5.6* 4.9*  ALBUMIN 2.8*   2.8* 2.8*  AST 15   17 15   ALT 10   9 9   ALKPHOS 86   88 66  BILITOT 1.4*   1.4* 1.2  BILIDIR 0.5*  --   IBILI 0.9  --    PT/INR Recent Labs    08/07/21 1510 08/08/21 0221  LABPROT 15.3* 16.0*  INR 1.2 1.3*   Studies/Results: CT ABDOMEN PELVIS WO CONTRAST  Result Date: 08/08/2021 CLINICAL DATA:  Nausea/vomiting Bowel obstruction suspected Acute on chronic kidney disease.  Liver cirrhosis.  Diarrhea. EXAM: CT ABDOMEN AND PELVIS WITHOUT CONTRAST TECHNIQUE: Multidetector CT imaging of the abdomen and pelvis was performed following the standard protocol without IV contrast. RADIATION DOSE REDUCTION: This exam was performed according to the departmental dose-optimization program which includes automated exposure control, adjustment of the mA and/or kV according to patient size and/or use of iterative reconstruction technique. COMPARISON:  Abdominal MRI 05/31/2021.  Abdominal CT 12/13/2020 FINDINGS: Lower chest: Chronic left pleural effusion, increased from prior abdominal CT. There is a trace right pleural effusion, new. Associated linear and passive atelectasis in the lung bases. Hepatobiliary: Cirrhotic hepatic morphology. Low-density within segment 7 of the liver corresponds to ablation defect on prior MRI. This is not well assessed on this unenhanced exam. The 1.1 cm lesion on MRI is not well seen on the current exam. Pneumobilia with air in the common bile duct and central intrahepatic ducts. Post cholecystectomy. Pancreas: Normal atrophy.  No ductal dilatation or inflammation. Spleen: Splenomegaly with spleen spanning 15.4 cm cranial caudal. Adrenals/Urinary Tract: No adrenal nodule. Bilateral renal parenchymal thinning and atrophy. No hydronephrosis. No renal calculi. Multiple bilateral renal cysts, including a hyperdense cyst in the mid upper  left kidney, unchanged from prior exam. Multiple surgical clips adjacent to the left kidney unchanged. The urinary bladder is minimally distended. No bladder Cleverly thickening. Stomach/Bowel: Equivocal Hodges thickening about the gastric cardia and gastric body. There is no abnormal gastric distension. No bowel obstruction with administered enteric contrast in the small bowel and to the level of the sigmoid colon. No significant small bowel inflammation. Prior right hemicolectomy. The appendix is not visualized, presumably absent. Mild sigmoid colonic diverticulosis. No diverticulitis. No obvious colonic inflammation, although the presence of ascites partially obscures detailed assessment. Vascular/Lymphatic: Aortic atherosclerosis. No aortic aneurysm. Multiple small retroperitoneal lymph nodes, not enlarged by size criteria. No definite enlarged lymph nodes in the abdomen or pelvis. Reproductive: Normal quiescent appearance of the uterus. No obvious adnexal mass, ascites the pelvis partially obscures a voxel assessment. Other: Moderate volume abdominopelvic ascites. Mild generalized mesenteric edema with small amount of mesenteric ascites. No free air or loculated fluid collection. Musculoskeletal: The bones are diffusely under mineralized. Prominent Schmorl's node within superior endplate of Z61 is unchanged. There are no acute or suspicious osseous abnormalities. IMPRESSION: 1. No bowel obstruction, enteric contrast reaches the colon. 2. Equivocal Carpenito thickening about the gastric cardia and gastric body, can be seen with gastritis or peptic ulcer disease. 3. Cirrhosis with portal hypertension, splenomegaly, and moderate volume abdominopelvic ascites. Low-density in the right hepatic lobe  corresponds to ablation defect on prior MRI. The additional 1 cm right lobe liver lesion on MRI is not well seen. Limited assessment for liver lesions on this unenhanced exam. 4. Chronic left pleural effusion, increased from prior  abdominal CT. Trace right pleural effusion, new from prior. 5. Minimal sigmoid diverticulosis without diverticulitis. 6. Bilateral renal parenchymal thinning and atrophy with multiple renal cysts. No hydronephrosis. Aortic Atherosclerosis (ICD10-I70.0). Electronically Signed   By: Keith Rake M.D.   On: 08/08/2021 15:54   DG Abdomen Acute W/Chest  Result Date: 08/07/2021 CLINICAL DATA:  Nausea and abdominal pain. History of colon cancer, hepatic cirrhosis and intractable ascites. Paracentesis performed last week. EXAM: DG ABDOMEN ACUTE WITH 1 VIEW CHEST COMPARISON:  Portable chest 03/02/2021, CT abdomen and pelvis no contrast 12/13/2020. FINDINGS: There is no evidence of dilated bowel loops or free intraperitoneal air. There is suspected to be at least mild-to-moderate volume ascites, as there is apparent medial displacement of the ascending and descending colon, which was seen on the prior CT. No radiopaque calculi or other significant radiographic abnormality is seen. There are cholecystectomy clips. Heart size and mediastinal contours are within normal limits. There is a low pulmonary inspiration with a small left pleural effusion similar to both prior studies, and atelectasis in the hypoinflated lung bases. The visualized lungs are otherwise clear. IMPRESSION: 1. Small left pleural effusion appears similar to the prior studies. The lungs hypoinflated and otherwise generally clear except for lower zonal atelectasis. 2. No evidence of bowel obstruction or free air. Probable mild-to-moderate ascites based on medial displacement of the ascending and descending colon as seen on the prior CT. Electronically Signed   By: Telford Nab M.D.   On: 08/07/2021 21:46    IMPRESSION:  1.  NASH cirrhosis.  Complications include refractory ascites.  Intolerance to spironolactone (hyperkalemia).  Portal hypertension with portal hypertensive gastropathy contributing to anemia. 2.  Renal insufficiency, being followed  by nephrology (Dr. Royce Macadamia).  Acute kidney injury on chronic kidney disease stage IIIa. 3.  [place] right liver lesion.  Followed by oncology.  Biopsy and thermal ablation of liver lesion March 08, 2021.  Pathology revealed well to moderately differentiated hepatocellular carcinoma. 4.  History of DVT.  No longer on chronic anticoagulation therapy 5.  Multifactorial anemia:  Hgb down to 6.7 grams this AM.  Received one unit of PRBCs.  She reports that she has been having dark stools for the past couple of weeks.  Hemoccult ordered but not yet collected. 6.  History of colon cancer.  Colonoscopy December 2021. 7.  Nausea, vomiting, diarrhea: It sounds like these issues have been ongoing for some time.  Stools for infectious sources negative.  PLAN: -I am not sure what our other options are for management of her ascites.  She cannot get diuretics currently because of her AKI.  Has been getting regular LVP with albumin weekly.  If she needs it here in the setting of AKI then could just have lower volume removed for now. -? Need for EGD for dark stools and anemia. -Treat diarrhea with Imodium prn. -Antiemetics prn.  Says that she was taking Zofran fairly regularly previously without much improvement. -Trend labs.   Laban Emperor. Zehr  08/09/2021, 12:39 PM  _______________________________________________________________________________________________________________________________________  Velora Heckler GI MD note:  I personally examined the patient, reviewed the data and agree with the assessment and plan described above.  She has NASH cirrhosis, hepatoma, with refractory ascites and AKI now.  She has been getting LVP every 1-3  weeks for 2 years now.  Recent dark stools and acute on chronic, multifactorial anemia.  Current MELD-NA is 22 (driven mostly by Cr 3.3, baseline seems closer to 2.5).    I think she needs an EGD for her dark stools, acute on chronic anemia (portal gastropathy, GAVE, perhaps new  varices developed). We will plan to do that tomorrow. We are also going to ask IR about the feasibility of TIPS placement for her refractory ascites, palliation. Not sure this is possible with her known [place] andher tenuous renal situation.  May need to involve nephrology in this decision as well.  Will follow along   Owens Loffler, MD [facility] Gastroenterology Pager [phone]

## 2021-08-09 NOTE — Assessment & Plan Note (Addendum)
Recurrent ascites. Portal hypertensive gastropathy. G AVE Secondary esophageal varices  Not a candidate for aggressive diuretic therapy. Receives large-volume paracentesis although in the setting of acute kidney injury not a good candidate for that as well. GI recommended TIPS procedure.  Patient wants to discuss with the family.  Procedure will be scheduled outpatient.  IR requesting echocardiogram and MRCP. No evidence of decompensation. MEL D score 20+.

## 2021-08-09 NOTE — Assessment & Plan Note (Signed)
Continue Synthroid °

## 2021-08-09 NOTE — Assessment & Plan Note (Signed)
Continue PPI twice daily. ?

## 2021-08-09 NOTE — Assessment & Plan Note (Addendum)
On glipizide outpatient.  Follow-up with PCP, consider stopping this medication outpatient.

## 2021-08-09 NOTE — Progress Notes (Signed)
Report received from Eldred Manges, Therapist, sports. Agree with Previous RN assessment. Patient resting in bed, no needs vocalized at this time. Call light and personal items with in reach of patient.

## 2021-08-09 NOTE — Assessment & Plan Note (Signed)
C diff Negative. GI pathogen negative CT abdomen negative for any acute abnormality.  Imodium PRN

## 2021-08-09 NOTE — H&P (View-Only) (Signed)
Referring Provider: Dr. Posey Pronto, [facility] Primary Care Physician:  Wannetta Sender, FNP Primary Gastroenterologist:  Dr. Henrene Pastor  Reason for Consultation:  Cirrhosis, ascites  HPI: Cheryl Jordan is a 81 y.o. 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  Presented to [facility] ED with complaints of abdominal pain and diarrhea with nausea and vomiting.  Found to have AKI.  Cr is improving while here.  She tells me that these issues have been present for a while.  She tells me that Dr. Benay Spice gave her prednisone a couple of weeks ago to try to help treat her symptoms, she started having dark stools.  She says that her stools have been quite dark since then.  Hemoglobin is down to 6.7 g this morning and she was transfused with a unit of packed red blood cells.  She says that she actually feels the best today than she has in quite some time.  Did have diarrhea this morning.  She uses Imodium at home to try to manage that.  She has tried Zofran quite regularly that has been prescribed by Dr. Henrene Pastor, but she says that that does not seem to work.  Stool studies here were negative for infectious source.  Primary question from hospitalist was how to manage her ascites in the setting with these symptoms and the acute kidney injury, etc.  CT of the abdomen and pelvis without contrast:   IMPRESSION: 1. No bowel obstruction, enteric contrast reaches the colon. 2. Equivocal Ramthun thickening about the gastric cardia and gastric body, can be seen with gastritis or peptic ulcer disease. 3. Cirrhosis with portal hypertension, splenomegaly, and moderate volume abdominopelvic ascites. Low-density in the right hepatic lobe corresponds to ablation defect on prior MRI. The additional 1 cm right lobe liver lesion on MRI is not well seen. Limited assessment for liver lesions on this unenhanced exam. 4. Chronic left pleural  effusion, increased from prior abdominal CT. Trace right pleural effusion, new from prior. 5. Minimal sigmoid diverticulosis without diverticulitis. 6. Bilateral renal parenchymal thinning and atrophy with multiple renal cysts. No hydronephrosis.   Aortic Atherosclerosis (ICD10-I70.0).  Colonoscopy 06/2020:  - Scar at the colonic anastomosis. Clip (MR conditional) was placed. - Post-polypectomy scar in the rectum. Clips (MR conditional) were placed. - The examination was otherwise normal. - No specimens collected.  EGD 06/2020:  - Z-line regular, 35 cm from the incisors. - No gross lesions in esophagus. - Portal hypertensive gastropathy - Portal hypertensive gastropathy. - Clotted blood in the gastric antrum and in the prepyloric region of the stomach. - Gastric antral vascular ectasia with bleeding. Treated with argon plasma coagulation (APC). Clips (MR conditional) were placed. - Normal examined duodenum. - No specimens collected.  A. DUODENUM, BIOPSY:  - Unremarkable duodenal mucosa.  - No features of celiac sprue or granulomas.   B. STOMACH, BIOPSY:  - Unremarkable gastric mucosa.  - Warthin-Starry negative for Helicobacter pylori.  - No intestinal metaplasia, dysplasia or carcinoma.   C. COLON, TRANSVERSE, DESCENDING, SIGMOID, RECTUM, POLYPECTOMY:  - Tubular adenoma (1).  - No high-grade dysplasia or carcinoma.  - Hyperplastic polyp (s).   D. RECTUM, BIOPSY:  - Unremarkable colonic mucosa with benign lymphoid aggregates.  - No active inflammation, chronic changes or granulomas.   Past Medical History:  Diagnosis Date   Allergy    Anemia    hx of   Anxiety  Aortic stenosis    mild by 04/08/20 echo   Arthritis    Colon cancer ([place]) dx'd 11/2014   Diabetes mellitus without complication (HCC)    TYPE II   Dyspnea    Fatty liver    Gallbladder disease    GERD (gastroesophageal reflux disease)    Heart murmur    History of gout    Hypertension     Hypothyroidism    Kidney disorder    spot on left kidney   Osteoporosis    PONV (postoperative nausea and vomiting)     Past Surgical History:  Procedure Laterality Date   BIOPSY  06/17/2020   Procedure: BIOPSY;  Surgeon: Irving Copas., MD;  Location: [facility];  Service: Gastroenterology;;   CATARACT EXTRACTION Left    CHOLECYSTECTOMY     COLONOSCOPY WITH PROPOFOL N/A 06/17/2020   Procedure: COLONOSCOPY WITH PROPOFOL;  Surgeon: Irving Copas., MD;  Location: [facility];  Service: Gastroenterology;  Laterality: N/A;   COLONOSCOPY WITH PROPOFOL N/A 07/01/2020   Procedure: COLONOSCOPY WITH PROPOFOL;  Surgeon: Mauri Pole, MD;  Location: [facility];  Service: Endoscopy;  Laterality: N/A;   ESOPHAGOGASTRODUODENOSCOPY (EGD) WITH PROPOFOL N/A 06/17/2020   Procedure: ESOPHAGOGASTRODUODENOSCOPY (EGD) WITH PROPOFOL;  Surgeon: Rush Landmark Telford Nab., MD;  Location: [facility];  Service: Gastroenterology;  Laterality: N/A;   ESOPHAGOGASTRODUODENOSCOPY (EGD) WITH PROPOFOL N/A 07/01/2020   Procedure: ESOPHAGOGASTRODUODENOSCOPY (EGD) WITH PROPOFOL;  Surgeon: Mauri Pole, MD;  Location: [facility];  Service: Endoscopy;  Laterality: N/A;   FOREIGN BODY REMOVAL  06/17/2020   Procedure: FOREIGN BODY REMOVAL;  Surgeon: Rush Landmark Telford Nab., MD;  Location: [facility];  Service: Gastroenterology;;   GI RADIOFREQUENCY ABLATION  06/17/2020   Procedure: GI RADIOFREQUENCY ABLATION;  Surgeon: Irving Copas., MD;  Location: [facility];  Service: Gastroenterology;;   HEMOSTASIS CLIP PLACEMENT  06/17/2020   Procedure: HEMOSTASIS CLIP PLACEMENT;  Surgeon: Irving Copas., MD;  Location: [facility];  Service: Gastroenterology;;   HEMOSTASIS CLIP PLACEMENT  07/01/2020   Procedure: HEMOSTASIS CLIP PLACEMENT;  Surgeon: Mauri Pole, MD;  Location: [facility];  Service: Endoscopy;;   HOT HEMOSTASIS N/A 07/01/2020   Procedure: HOT HEMOSTASIS  (ARGON PLASMA COAGULATION/BICAP);  Surgeon: Mauri Pole, MD;  Location: [facility];  Service: Endoscopy;  Laterality: N/A;   IR PARACENTESIS  08/19/2020   IR PARACENTESIS  09/26/2020   IR PARACENTESIS  11/03/2020   IR PARACENTESIS  11/28/2020   IR PARACENTESIS  12/21/2020   IR PARACENTESIS  01/17/2021   IR PARACENTESIS  02/08/2021   IR PARACENTESIS  02/23/2021   IR PARACENTESIS  03/22/2021   IR PARACENTESIS  04/06/2021   IR PARACENTESIS  04/20/2021   IR PARACENTESIS  05/03/2021   IR PARACENTESIS  05/16/2021   IR PARACENTESIS  05/29/2021   IR PARACENTESIS  06/07/2021   IR PARACENTESIS  06/15/2021   IR PARACENTESIS  06/28/2021   IR PARACENTESIS  07/05/2021   IR PARACENTESIS  07/14/2021   IR PARACENTESIS  07/21/2021   IR PARACENTESIS  08/01/2021   IR RADIOLOGIST EVAL & MGMT  01/24/2017   IR RADIOLOGIST EVAL & MGMT  02/22/2021   IR RADIOLOGIST EVAL & MGMT  03/28/2021   IR RADIOLOGIST EVAL & MGMT  06/06/2021   KNEE CARTILAGE SURGERY Bilateral    NEPHRECTOMY Left 02/10/2015   Procedure: OPEN RETROPERINTONEAL EXPLORATION LEFT RENAL  CYST DECORTICATION X 5;  Surgeon: Cleon Gustin, MD;  Location: [facility];  Service: Urology;  Laterality: Left;   PARTIAL  COLECTOMY N/A 02/10/2015   Procedure: OPEN RIGHT  COLECTOMY ;  Surgeon: [name], MD;  Location: [facility];  Service: General;  Laterality: N/A;   POLYPECTOMY  06/17/2020   Procedure: POLYPECTOMY;  Surgeon: Irving Copas., MD;  Location: [facility];  Service: Gastroenterology;;   RADIOLOGY WITH ANESTHESIA N/A 03/08/2021   Procedure: CT MICROWAVE ABLATION WITH ANESTHESIA;  Surgeon: Arne Cleveland, MD;  Location: [facility];  Service: Radiology;  Laterality: N/A;   TUBAL LIGATION      Prior to Admission medications   Medication Sig Start Date End Date Taking? Authorizing Provider  acetaminophen (TYLENOL) 500 MG tablet Take 500 mg by mouth daily as needed for moderate pain.   Yes [provider]  amLODipine (NORVASC)  2.5 MG tablet Take 1 tablet (2.5 mg total) by mouth daily. 12/04/19  Yes Brunetta Jeans, PA-C  furosemide (LASIX) 40 MG tablet Take 1.5 tablets (60 mg total) by mouth daily. Patient taking differently: Take 40 mg by mouth daily. 09/21/20  Yes [name], MD  glipiZIDE (GLUCOTROL XL) 5 MG 24 hr tablet Take 5 mg by mouth daily with breakfast.   Yes [provider]  levothyroxine (SYNTHROID) 88 MCG tablet Take 1 tablet (88 mcg total) by mouth daily. 12/04/19  Yes Brunetta Jeans, PA-C  loperamide (IMODIUM) 2 MG capsule Take 2 mg by mouth as needed for diarrhea or loose stools.   Yes [provider]  LORazepam (ATIVAN) 0.5 MG tablet Take 0.5 mg by mouth 2 (two) times daily as needed for anxiety.   Yes [provider]  omeprazole (PRILOSEC) 20 MG capsule Take 1 capsule (20 mg total) by mouth 2 (two) times daily before a meal. 08/10/20  Yes Brunetta Jeans, PA-C  ondansetron (ZOFRAN) 4 MG tablet Take 1-2 tablets 20 minutes before meals Patient taking differently: Take 4-8 mg by mouth every 6 (six) hours as needed for nausea or vomiting. Take 1-2 tablets 20 minutes before meals 05/11/21  Yes [name], MD  propranolol (INDERAL) 40 MG tablet Take 20 mg by mouth 2 (two) times daily. 09/30/20  Yes [provider]  Simethicone (GAS-X PO) Take 1 tablet by mouth as needed (flatulence).   Yes [provider]  Vitamin D, Ergocalciferol, (DRISDOL) 1.25 MG (50000 UNIT) CAPS capsule Take 50,000 Units by mouth once a week. Saturday 06/01/21  Yes [provider]  [place] test strip  10/20/18   [provider]  Blood Glucose Calibration (ACCU-CHEK AVIVA) SOLN     [provider]  rivaroxaban (XARELTO) 20 MG TABS tablet Take 1 tablet (20 mg total) by mouth daily with supper. Patient not taking: Reported on 08/04/2020 07/08/20 08/10/20  Eugenie Filler, MD    Current Facility-Administered Medications  Medication Dose Route  Frequency Provider Last Rate Last Admin   acetaminophen (TYLENOL) tablet 650 mg  650 mg Oral Q6H PRN Toy Baker, MD       Or   acetaminophen (TYLENOL) suppository 650 mg  650 mg Rectal Q6H PRN Toy Baker, MD       feeding supplement (BOOST / RESOURCE BREEZE) liquid 1 Container  1 Container Oral TID BM Lavina Hamman, MD   1 Container at 08/09/21 1134   levothyroxine (SYNTHROID) tablet 88 mcg  88 mcg Oral Q0600 Toy Baker, MD   88 mcg at 08/09/21 0521   loperamide (IMODIUM) capsule 2 mg  2 mg Oral PRN Lavina Hamman, MD   2 mg at  08/09/21 1137   LORazepam (ATIVAN) tablet 0.5 mg  0.5 mg Oral BID PRN Lavina Hamman, MD   0.5 mg at 08/09/21 0841   ondansetron [facility]) injection 4 mg  4 mg Intravenous Q6H PRN Lavina Hamman, MD   4 mg at 08/08/21 6962   prochlorperazine (COMPAZINE) injection 10 mg  10 mg Intravenous Q6H PRN Lavina Hamman, MD   10 mg at 08/08/21 1144    Allergies as of 08/07/2021 - Review Complete 08/07/2021  Allergen Reaction Noted   Aspirin Other (See Comments) 10/16/2011   Codeine Nausea And Vomiting 10/16/2011    Family History  Problem Relation Age of Onset   Diabetes Mother    Hypertension Other    COPD Father    Hypertension Sister    GER disease Sister    Heart attack Brother    COPD Brother    Lung cancer Brother    COPD Brother    Hypertension Brother     Social History   Socioeconomic History   Marital status: Married    Spouse name: Not on file   Number of children: 2   Years of education: Not on file   Highest education level: Not on file  Occupational History   Occupation: Retired  Tobacco Use   Smoking status: Never   Smokeless tobacco: Never  Vaping Use   Vaping Use: Never used  Substance and Sexual Activity   Alcohol use: No   Drug use: No   Sexual activity: Never  Other Topics Concern   Not on file  Social History Narrative   03/03/15-Married, husband Jeneen Rinks   #2 grown sons and #2 grand daughters    Social Determinants of Health   Financial Resource Strain: Not on file  Food Insecurity: Not on file  Transportation Needs: Not on file  Physical Activity: Not on file  Stress: Not on file  Social Connections: Not on file  Intimate Partner Violence: Not on file    Review of Systems: ROS is O/W negative except as mentioned in HPI.  Physical Exam: Vital signs in last 24 hours: Temp:  [97.4 F (36.3 C)-98.1 F (36.7 C)] 97.7 F (36.5 C) (01/25 1204) Pulse Rate:  [81-96] 81 (01/25 1204) Resp:  [17-20] 17 (01/25 1204) BP: (115-148)/(54-80) 126/62 (01/25 1204) SpO2:  [94 %-99 %] 99 % (01/25 1204) Weight:  [84.4 kg] 84.4 kg (01/25 1000) Last BM Date: 08/08/21 General:  Alert, elderly, pleasant and cooperative in NAD Head:  Normocephalic and atraumatic. Eyes:  Sclera clear, no icterus.  Conjunctiva pink. Ears:  Normal auditory acuity. Mouth:  No deformity or lesions.   Lungs:  Clear throughout to auscultation.  No wheezes, crackles, or rhonchi.  Heart:  Regular rate and rhythm; no murmurs, clicks, rubs, or gallops. Abdomen:  Soft but distended with ascites fluid.   Msk:  Symmetrical without gross deformities. Pulses:  Normal pulses noted. Extremities:  Without clubbing or edema. Neurologic:  Alert and oriented x 4;  grossly normal neurologically. Skin:  Intact without significant lesions or rashes. Psych:  Alert and cooperative. Normal mood and affect.  Intake/Output from previous day: 01/24 0701 - 01/25 0700 In: 810.8 [P.O.:420; I.V.:190.8; IV Piggyback:200] Out: -  Intake/Output this shift: Total I/O In: 654 [P.O.:240; Blood:414] Out: -   Lab Results: Recent Labs    08/07/21 2300 08/08/21 0221 08/09/21 0512  WBC 5.0 5.0 3.4*  HGB 8.3* 8.5* 6.7*  HCT 27.3* 27.6* 21.8*  PLT 164 167 122*   BMET  Recent Labs    08/07/21 1510 08/08/21 0221 08/09/21 0512  NA 136 137 138  K 4.6 4.2 4.0  CL 108 107 111  CO2 21* 20* 19*  GLUCOSE 76 104* 86  BUN 52* 51* 47*   CREATININE 4.19* 4.09* 3.37*  CALCIUM 8.4* 8.3* 8.3*   LFT Recent Labs    08/08/21 0221 08/09/21 0512  PROT 5.5*   5.6* 4.9*  ALBUMIN 2.8*   2.8* 2.8*  AST 15   17 15   ALT 10   9 9   ALKPHOS 86   88 66  BILITOT 1.4*   1.4* 1.2  BILIDIR 0.5*  --   IBILI 0.9  --    PT/INR Recent Labs    08/07/21 1510 08/08/21 0221  LABPROT 15.3* 16.0*  INR 1.2 1.3*   Studies/Results: CT ABDOMEN PELVIS WO CONTRAST  Result Date: 08/08/2021 CLINICAL DATA:  Nausea/vomiting Bowel obstruction suspected Acute on chronic kidney disease.  Liver cirrhosis.  Diarrhea. EXAM: CT ABDOMEN AND PELVIS WITHOUT CONTRAST TECHNIQUE: Multidetector CT imaging of the abdomen and pelvis was performed following the standard protocol without IV contrast. RADIATION DOSE REDUCTION: This exam was performed according to the departmental dose-optimization program which includes automated exposure control, adjustment of the mA and/or kV according to patient size and/or use of iterative reconstruction technique. COMPARISON:  Abdominal MRI 05/31/2021.  Abdominal CT 12/13/2020 FINDINGS: Lower chest: Chronic left pleural effusion, increased from prior abdominal CT. There is a trace right pleural effusion, new. Associated linear and passive atelectasis in the lung bases. Hepatobiliary: Cirrhotic hepatic morphology. Low-density within segment 7 of the liver corresponds to ablation defect on prior MRI. This is not well assessed on this unenhanced exam. The 1.1 cm lesion on MRI is not well seen on the current exam. Pneumobilia with air in the common bile duct and central intrahepatic ducts. Post cholecystectomy. Pancreas: Normal atrophy.  No ductal dilatation or inflammation. Spleen: Splenomegaly with spleen spanning 15.4 cm cranial caudal. Adrenals/Urinary Tract: No adrenal nodule. Bilateral renal parenchymal thinning and atrophy. No hydronephrosis. No renal calculi. Multiple bilateral renal cysts, including a hyperdense cyst in the mid upper  left kidney, unchanged from prior exam. Multiple surgical clips adjacent to the left kidney unchanged. The urinary bladder is minimally distended. No bladder Keiper thickening. Stomach/Bowel: Equivocal Chlebowski thickening about the gastric cardia and gastric body. There is no abnormal gastric distension. No bowel obstruction with administered enteric contrast in the small bowel and to the level of the sigmoid colon. No significant small bowel inflammation. Prior right hemicolectomy. The appendix is not visualized, presumably absent. Mild sigmoid colonic diverticulosis. No diverticulitis. No obvious colonic inflammation, although the presence of ascites partially obscures detailed assessment. Vascular/Lymphatic: Aortic atherosclerosis. No aortic aneurysm. Multiple small retroperitoneal lymph nodes, not enlarged by size criteria. No definite enlarged lymph nodes in the abdomen or pelvis. Reproductive: Normal quiescent appearance of the uterus. No obvious adnexal mass, ascites the pelvis partially obscures a voxel assessment. Other: Moderate volume abdominopelvic ascites. Mild generalized mesenteric edema with small amount of mesenteric ascites. No free air or loculated fluid collection. Musculoskeletal: The bones are diffusely under mineralized. Prominent Schmorl's node within superior endplate of A12 is unchanged. There are no acute or suspicious osseous abnormalities. IMPRESSION: 1. No bowel obstruction, enteric contrast reaches the colon. 2. Equivocal Draheim thickening about the gastric cardia and gastric body, can be seen with gastritis or peptic ulcer disease. 3. Cirrhosis with portal hypertension, splenomegaly, and moderate volume abdominopelvic ascites. Low-density in the right hepatic lobe  corresponds to ablation defect on prior MRI. The additional 1 cm right lobe liver lesion on MRI is not well seen. Limited assessment for liver lesions on this unenhanced exam. 4. Chronic left pleural effusion, increased from prior  abdominal CT. Trace right pleural effusion, new from prior. 5. Minimal sigmoid diverticulosis without diverticulitis. 6. Bilateral renal parenchymal thinning and atrophy with multiple renal cysts. No hydronephrosis. Aortic Atherosclerosis (ICD10-I70.0). Electronically Signed   By: Keith Rake M.D.   On: 08/08/2021 15:54   DG Abdomen Acute W/Chest  Result Date: 08/07/2021 CLINICAL DATA:  Nausea and abdominal pain. History of colon cancer, hepatic cirrhosis and intractable ascites. Paracentesis performed last week. EXAM: DG ABDOMEN ACUTE WITH 1 VIEW CHEST COMPARISON:  Portable chest 03/02/2021, CT abdomen and pelvis no contrast 12/13/2020. FINDINGS: There is no evidence of dilated bowel loops or free intraperitoneal air. There is suspected to be at least mild-to-moderate volume ascites, as there is apparent medial displacement of the ascending and descending colon, which was seen on the prior CT. No radiopaque calculi or other significant radiographic abnormality is seen. There are cholecystectomy clips. Heart size and mediastinal contours are within normal limits. There is a low pulmonary inspiration with a small left pleural effusion similar to both prior studies, and atelectasis in the hypoinflated lung bases. The visualized lungs are otherwise clear. IMPRESSION: 1. Small left pleural effusion appears similar to the prior studies. The lungs hypoinflated and otherwise generally clear except for lower zonal atelectasis. 2. No evidence of bowel obstruction or free air. Probable mild-to-moderate ascites based on medial displacement of the ascending and descending colon as seen on the prior CT. Electronically Signed   By: Telford Nab M.D.   On: 08/07/2021 21:46    IMPRESSION:  1.  NASH cirrhosis.  Complications include refractory ascites.  Intolerance to spironolactone (hyperkalemia).  Portal hypertension with portal hypertensive gastropathy contributing to anemia. 2.  Renal insufficiency, being followed  by nephrology (Dr. Royce Macadamia).  Acute kidney injury on chronic kidney disease stage IIIa. 3.  [place] right liver lesion.  Followed by oncology.  Biopsy and thermal ablation of liver lesion March 08, 2021.  Pathology revealed well to moderately differentiated hepatocellular carcinoma. 4.  History of DVT.  No longer on chronic anticoagulation therapy 5.  Multifactorial anemia:  Hgb down to 6.7 grams this AM.  Received one unit of PRBCs.  She reports that she has been having dark stools for the past couple of weeks.  Hemoccult ordered but not yet collected. 6.  History of colon cancer.  Colonoscopy December 2021. 7.  Nausea, vomiting, diarrhea: It sounds like these issues have been ongoing for some time.  Stools for infectious sources negative.  PLAN: -I am not sure what our other options are for management of her ascites.  She cannot get diuretics currently because of her AKI.  Has been getting regular LVP with albumin weekly.  If she needs it here in the setting of AKI then could just have lower volume removed for now. -? Need for EGD for dark stools and anemia. -Treat diarrhea with Imodium prn. -Antiemetics prn.  Says that she was taking Zofran fairly regularly previously without much improvement. -Trend labs.   Laban Emperor. Zehr  08/09/2021, 12:39 PM  _______________________________________________________________________________________________________________________________________  Velora Heckler GI MD note:  I personally examined the patient, reviewed the data and agree with the assessment and plan described above.  She has NASH cirrhosis, hepatoma, with refractory ascites and AKI now.  She has been getting LVP every 1-3  weeks for 2 years now.  Recent dark stools and acute on chronic, multifactorial anemia.  Current MELD-NA is 22 (driven mostly by Cr 3.3, baseline seems closer to 2.5).    I think she needs an EGD for her dark stools, acute on chronic anemia (portal gastropathy, GAVE, perhaps new  varices developed). We will plan to do that tomorrow. We are also going to ask IR about the feasibility of TIPS placement for her refractory ascites, palliation. Not sure this is possible with her known [place] andher tenuous renal situation.  May need to involve nephrology in this decision as well.  Will follow along   Owens Loffler, MD [facility] Pager [phone]

## 2021-08-09 NOTE — Assessment & Plan Note (Signed)
Body mass index is 29.14 kg/m.   Malnutrition Type: Nutrition Problem: Severe Malnutrition Etiology: chronic illness (colon cancer, cirrhosis) Nutrition Interventions: Interventions: MVI, Boost Breeze, Boost Plus

## 2021-08-09 NOTE — Assessment & Plan Note (Signed)
On Inderal and Norvasc.  In the setting of soft blood pressure currently holding all medication.

## 2021-08-09 NOTE — Progress Notes (Signed)
Progress Note   [name] QPY:[id number] DOB: 1940-10-20 DOA: 08/07/2021     2 DOS: the patient was seen and examined on 08/09/2021   Brief hospital course: No notes on file  Assessment and Plan * Acute renal failure superimposed on stage 3b chronic kidney disease ([place])- (present on admission) Likely from poor p.o. intake in the setting of diarrhea as well as nausea and vomiting. To baseline serum creatinine around 2. On presentation serum creatinine around 4. Received IV fluids and IV albumin. Now getting PRBC. Will initiate further work-up if renal function does not improve including nephrology consultation as there is concern for hepatorenal syndrome.  Intractable nausea and vomiting- (present on admission) Resolved now.  May be gastritis.  Diarrhea- (present on admission) C diff Negative. GI pathogen negative CT abdomen negative for any acute abnormality.  Imodium PRN  Other cirrhosis of liver ([place])- (present on admission) Recurrent ascites. Not a candidate for diuretics. Receives large-volume paracentesis although in the setting of acute kidney injury not a good candidate for that as well. Will monitor closely. No evidence of decompensation. MEL D score 20+.  Adenocarcinoma of colon ([place])- (present on admission) CT shows no evidence of acute worsening.  Symptomatic anemia- (present on admission) Associated with pancytopenia. Suspect this is dilutional in nature. Although patient does report some dark stool and therefore GI consulted. Continue PPI. Appreciate GI consultation.  Hypothyroidism- (present on admission) Continue Synthroid.  GERD (gastroesophageal reflux disease)- (present on admission) Continue PPI twice daily.  Protein-calorie malnutrition, severe- (present on admission) Body mass index is 29.14 kg/m.   Malnutrition Type: Nutrition Problem: Severe Malnutrition Etiology: chronic illness (colon cancer, cirrhosis) Nutrition  Interventions: Interventions: MVI, Boost Breeze, Boost Plus  Hypertension associated with diabetes ([place])- (present on admission) On Inderal and Norvasc.  In the setting of soft blood pressure currently holding all medication.  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  Hemoglobin A1c 4.8. Currently sliding scale insulin only in the setting of hypoglycemia.     Subjective: Nausea resolved.  No vomiting.  Continues to have diarrhea.  Reports dark stool.  No abdominal pain.  Breathing okay.  No fever no chills.  Objective  Vitals:   08/09/21 1000 08/09/21 1204 08/09/21 1315 08/09/21 2011  BP:  126/62 (!) 129/55 131/68  Pulse:  81 88 93  Resp:  17 16 16   Temp:  97.7 F (36.5 C) 98.1 F (36.7 C) 98.7 F (37.1 C)  TempSrc:  Oral Oral Oral  SpO2:  99% 94% 94%  Weight: 84.4 kg       General: Appear in mild distress, no Rash; Oral Mucosa Clear, moist. no Abnormal Neck Mass Or lumps, Conjunctiva normal  Cardiovascular: S1 and S2 Present, no Murmur, Respiratory: good respiratory effort, Bilateral Air entry present and CTA, no Crackles, no wheezes Abdomen: Bowel Sound present, Soft distended and no tenderness Extremities: no Pedal edema Neurology: alert and oriented to time, place, and person affect appropriate. no new focal deficit Gait not checked due to patient safety concerns   Data Reviewed:  I have Reviewed nursing notes, Vitals, and Lab results since pt's last encounter. Pertinent lab results CMP showing serum creatinine 3.37, CBC showing WBC of 3.4, platelet 122, hemoglobin 6.7, repeat hemoglobin showing improvement with value 8.7 I have ordered test including CBC and BMP I have reviewed the last note from gastroenterology,  and discussed pt's care plan and test results with them.   Family Communication: None at  bedside  Disposition: Status is: Inpatient  Remains inpatient appropriate because: Requiring transfusion,  further work-up for bleeding, will require paracentesis as well, renal function still not improving.         Author: Berle Mull, MD 08/09/2021 8:27 PM  For on call review [email or web address].

## 2021-08-09 NOTE — Assessment & Plan Note (Addendum)
Likely from poor p.o. intake in the setting of diarrhea as well as nausea and vomiting. To baseline serum creatinine around 2. On presentation serum creatinine around 4. Received IV fluids and IV albumin and transfusion. Renal function now improving.

## 2021-08-09 NOTE — Assessment & Plan Note (Signed)
Resolved now.  May be gastritis.

## 2021-08-10 ENCOUNTER — Encounter (HOSPITAL_COMMUNITY)
Admission: EM | Disposition: A | Discharge status: Home / Self Care | Payer: Self-pay | Source: Home / Self Care | Attending: Internal Medicine

## 2021-08-10 ENCOUNTER — Inpatient Hospital Stay (HOSPITAL_COMMUNITY): Payer: Medicare Other | Admitting: Certified Registered Nurse Anesthetist

## 2021-08-10 ENCOUNTER — Encounter (HOSPITAL_COMMUNITY): Payer: Self-pay | Admitting: Internal Medicine

## 2021-08-10 DIAGNOSIS — K766 Portal hypertension: Secondary | ICD-10-CM

## 2021-08-10 DIAGNOSIS — K31819 Angiodysplasia of stomach and duodenum without bleeding: Secondary | ICD-10-CM

## 2021-08-10 DIAGNOSIS — K317 Polyp of stomach and duodenum: Secondary | ICD-10-CM

## 2021-08-10 DIAGNOSIS — I8510 Secondary esophageal varices without bleeding: Secondary | ICD-10-CM | POA: Diagnosis present

## 2021-08-10 DIAGNOSIS — K3189 Other diseases of stomach and duodenum: Secondary | ICD-10-CM

## 2021-08-10 DIAGNOSIS — D500 Iron deficiency anemia secondary to blood loss (chronic): Secondary | ICD-10-CM | POA: Diagnosis present

## 2021-08-10 HISTORY — PX: ESOPHAGOGASTRODUODENOSCOPY (EGD) WITH PROPOFOL: SHX5813

## 2021-08-10 HISTORY — PX: HOT HEMOSTASIS: SHX5433

## 2021-08-10 LAB — GLUCOSE, CAPILLARY
Glucose-Capillary: 131 mg/dL — ABNORMAL HIGH (ref 70–99)
Glucose-Capillary: 156 mg/dL — ABNORMAL HIGH (ref 70–99)
Glucose-Capillary: 88 mg/dL (ref 70–99)
Glucose-Capillary: 93 mg/dL (ref 70–99)
Glucose-Capillary: 95 mg/dL (ref 70–99)
Glucose-Capillary: 97 mg/dL (ref 70–99)

## 2021-08-10 LAB — TYPE AND SCREEN
ABO/RH(D): A POS
Antibody Screen: NEGATIVE
Unit division: 0

## 2021-08-10 LAB — COMPREHENSIVE METABOLIC PANEL
ALT: 9 U/L (ref 0–44)
AST: 15 U/L (ref 15–41)
Albumin: 2.7 g/dL — ABNORMAL LOW (ref 3.5–5.0)
Alkaline Phosphatase: 72 U/L (ref 38–126)
Anion gap: 7 (ref 5–15)
BUN: 43 mg/dL — ABNORMAL HIGH (ref 8–23)
CO2: 19 mmol/L — ABNORMAL LOW (ref 22–32)
Calcium: 8.3 mg/dL — ABNORMAL LOW (ref 8.9–10.3)
Chloride: 110 mmol/L (ref 98–111)
Creatinine, Ser: 3.2 mg/dL — ABNORMAL HIGH (ref 0.44–1.00)
GFR, Estimated: 14 mL/min — ABNORMAL LOW (ref >60)
Glucose, Bld: 92 mg/dL (ref 70–99)
Potassium: 4.1 mmol/L (ref 3.5–5.1)
Sodium: 136 mmol/L (ref 135–145)
Total Bilirubin: 2.4 mg/dL — ABNORMAL HIGH (ref 0.3–1.2)
Total Protein: 4.8 g/dL — ABNORMAL LOW (ref 6.5–8.1)

## 2021-08-10 LAB — MAGNESIUM: Magnesium: 2.2 mg/dL (ref 1.7–2.4)

## 2021-08-10 LAB — CBC WITH DIFFERENTIAL/PLATELET
Abs Immature Granulocytes: 0.01 10*3/uL (ref 0.00–0.07)
Basophils Absolute: 0 10*3/uL (ref 0.0–0.1)
Basophils Relative: 0 %
Eosinophils Absolute: 0.1 10*3/uL (ref 0.0–0.5)
Eosinophils Relative: 2 %
HCT: 27 % — ABNORMAL LOW (ref 36.0–46.0)
Hemoglobin: 8.6 g/dL — ABNORMAL LOW (ref 12.0–15.0)
Immature Granulocytes: 0 %
Lymphocytes Relative: 26 %
Lymphs Abs: 1.1 10*3/uL (ref 0.7–4.0)
MCH: 29.7 pg (ref 26.0–34.0)
MCHC: 31.9 g/dL (ref 30.0–36.0)
MCV: 93.1 fL (ref 80.0–100.0)
Monocytes Absolute: 0.4 10*3/uL (ref 0.1–1.0)
Monocytes Relative: 9 %
Neutro Abs: 2.6 10*3/uL (ref 1.7–7.7)
Neutrophils Relative %: 63 %
Platelets: 124 10*3/uL — ABNORMAL LOW (ref 150–400)
RBC: 2.9 MIL/uL — ABNORMAL LOW (ref 3.87–5.11)
RDW: 16.5 % — ABNORMAL HIGH (ref 11.5–15.5)
WBC: 4.1 10*3/uL (ref 4.0–10.5)
nRBC: 0 % (ref 0.0–0.2)

## 2021-08-10 LAB — BPAM RBC
Blood Product Expiration Date: 202302082359
ISSUE DATE / TIME: 202301250825
Unit Type and Rh: 6200

## 2021-08-10 LAB — BILIRUBIN, DIRECT: Bilirubin, Direct: 0.9 mg/dL — ABNORMAL HIGH (ref 0.0–0.2)

## 2021-08-10 LAB — PROTIME-INR
INR: 1.5 — ABNORMAL HIGH (ref 0.8–1.2)
Prothrombin Time: 17.6 seconds — ABNORMAL HIGH (ref 11.4–15.2)

## 2021-08-10 SURGERY — ESOPHAGOGASTRODUODENOSCOPY (EGD) WITH PROPOFOL
Anesthesia: Monitor Anesthesia Care

## 2021-08-10 MED ORDER — PROPOFOL 500 MG/50ML IV EMUL
INTRAVENOUS | Status: AC
  Filled 2021-08-10: qty 150

## 2021-08-10 MED ORDER — SODIUM CHLORIDE 0.9 % IV SOLN: INTRAVENOUS | Status: DC

## 2021-08-10 MED ORDER — PROPOFOL 500 MG/50ML IV EMUL
INTRAVENOUS | Status: AC
  Filled 2021-08-10: qty 50

## 2021-08-10 MED ORDER — PROPOFOL 500 MG/50ML IV EMUL
INTRAVENOUS | Status: DC | PRN
  Administered 2021-08-10: 100 ug/kg/min via INTRAVENOUS

## 2021-08-10 MED ORDER — LIDOCAINE 2% (20 MG/ML) 5 ML SYRINGE
INTRAMUSCULAR | Status: DC | PRN
  Administered 2021-08-10: 80 mg via INTRAVENOUS

## 2021-08-10 MED ORDER — PANTOPRAZOLE SODIUM 40 MG PO TBEC
40.0000 mg | DELAYED_RELEASE_TABLET | Freq: Two times a day (BID) | ORAL | Status: DC
  Administered 2021-08-10 – 2021-08-11 (×2): 40 mg via ORAL
  Filled 2021-08-10 (×2): qty 1

## 2021-08-10 MED ORDER — PROPOFOL 10 MG/ML IV BOLUS
INTRAVENOUS | Status: DC | PRN
Start: 2021-08-10 — End: 2021-08-10
  Administered 2021-08-10: 20 mg via INTRAVENOUS

## 2021-08-10 MED ORDER — SUCRALFATE 1 G PO TABS
1.0000 g | ORAL_TABLET | Freq: Two times a day (BID) | ORAL | Status: DC
  Administered 2021-08-10 – 2021-08-11 (×3): 1 g via ORAL
  Filled 2021-08-10 (×3): qty 1

## 2021-08-10 NOTE — Transfer of Care (Signed)
Immediate Anesthesia Transfer of Care Note  [name]  Procedure(s) Performed: ESOPHAGOGASTRODUODENOSCOPY (EGD) WITH PROPOFOL HOT HEMOSTASIS (ARGON PLASMA COAGULATION/BICAP)  Patient Location: Endoscopy Unit  Anesthesia Type:MAC  Level of Consciousness: awake, alert  and oriented  Airway & Oxygen Therapy: Patient Spontanous Breathing and Patient connected to face mask oxygen  Post-op Assessment: Report given to RN and Post -op Vital signs reviewed and stable  Post vital signs: Reviewed and stable  Last Vitals:  Vitals Value Taken Time  BP 114/47 08/10/21 0900  Temp    Pulse 73 08/10/21 0900  Resp 20 08/10/21 0900  SpO2 97 % 08/10/21 0900  Vitals shown include unvalidated device data.  Last Pain:  Vitals:   08/10/21 0752  TempSrc: Temporal  PainSc: 0-No pain         Complications: No notable events documented.

## 2021-08-10 NOTE — Assessment & Plan Note (Addendum)
Pancytopenia. GI was consulted. SP 1 PRBC transfusion. Patient currently receiving PPI twice daily Underwent EGD which shows evidence of mild gastritis treated with APC. H&H now stable.

## 2021-08-10 NOTE — Anesthesia Postprocedure Evaluation (Signed)
Anesthesia Post Note  [name]  Procedure(s) Performed: ESOPHAGOGASTRODUODENOSCOPY (EGD) WITH PROPOFOL HOT HEMOSTASIS (ARGON PLASMA COAGULATION/BICAP)     Patient location during evaluation: Endoscopy Anesthesia Type: MAC Level of consciousness: awake and alert Pain management: pain level controlled Vital Signs Assessment: post-procedure vital signs reviewed and stable Respiratory status: spontaneous breathing, nonlabored ventilation, respiratory function stable and patient connected to nasal cannula oxygen Cardiovascular status: blood pressure returned to baseline and stable Postop Assessment: no apparent nausea or vomiting Anesthetic complications: no   No notable events documented.  Last Vitals:  Vitals:   08/10/21 0923 08/10/21 1000  BP: 133/74 128/89  Pulse:  83  Resp: (!) 21   Temp:  36.6 C  SpO2: 94% 94%    Last Pain:  Vitals:   08/10/21 1000  TempSrc: Oral  PainSc: 0-No pain                 Cheryl Jordan Cheryl Jordan

## 2021-08-10 NOTE — Anesthesia Preprocedure Evaluation (Addendum)
Anesthesia Evaluation  Patient identified by MRN, date of birth, ID band Patient awake    Reviewed: Allergy & Precautions, NPO status , Patient's Chart, lab work & pertinent test results, reviewed documented beta blocker date and time   History of Anesthesia Complications (+) PONV  Airway Mallampati: II  TM Distance: >3 FB Neck ROM: Full    Dental  (+) Partial Upper, Partial Lower   Pulmonary neg pulmonary ROS,    Pulmonary exam normal breath sounds clear to auscultation       Cardiovascular hypertension, Pt. on medications and Pt. on home beta blockers Normal cardiovascular exam+ Valvular Problems/Murmurs (mild AS) AS  Rhythm:Regular Rate:Normal  TTE 2021 1. Left ventricular ejection fraction, by estimation, is 60 to 65%. The  left ventricle has normal function. The left ventricle has no regional  Caisse motion abnormalities. There is moderate left ventricular hypertrophy.  Left ventricular diastolic  parameters are consistent with Grade I diastolic dysfunction (impaired  relaxation).  2. Right ventricular systolic function is normal. The right ventricular  size is normal. Tricuspid regurgitation signal is inadequate for assessing  PA pressure.  3. Left atrial size was mildly dilated.  4. The mitral valve is normal in structure. Mild mitral valve  regurgitation. No evidence of mitral stenosis.  5. The aortic valve is tricuspid. Aortic valve regurgitation is not  visualized. Mild aortic valve stenosis. Aortic valve area, by VTI measures  2.11 cm. Aortic valve mean gradient measures 11.0 mmHg.  6. The inferior vena cava is normal in size with greater than 50%  respiratory variability, suggesting right atrial pressure of 3 mmHg.   Neuro/Psych PSYCHIATRIC DISORDERS Anxiety negative neurological ROS     GI/Hepatic Neg liver ROS, PUD, GERD  Medicated,  Endo/Other  diabetes, Type 2Hypothyroidism   Renal/GU negative  Renal ROS  negative genitourinary   Musculoskeletal  (+) Arthritis ,   Abdominal   Peds  Hematology  (+) Blood dyscrasia, anemia , Lab Results      Component                Value               Date                      WBC                      4.1                 08/10/2021                HGB                      8.6 (L)             08/10/2021                HCT                      27.0 (L)            08/10/2021                MCV                      93.1                08/10/2021  PLT                      124 (L)             08/10/2021              Anesthesia Other Findings   Reproductive/Obstetrics                            Anesthesia Physical Anesthesia Plan  ASA: 3  Anesthesia Plan: MAC   Post-op Pain Management:    Induction: Intravenous  PONV Risk Score and Plan: 3 and Propofol infusion and Treatment may vary due to age or medical condition  Airway Management Planned: Natural Airway  Additional Equipment:   Intra-op Plan:   Post-operative Plan:   Informed Consent: I have reviewed the patients History and Physical, chart, labs and discussed the procedure including the risks, benefits and alternatives for the proposed anesthesia with the patient or authorized representative who has indicated his/her understanding and acceptance.   Patient has DNR.  Discussed DNR with patient and Suspend DNR.   Dental advisory given  Plan Discussed with: CRNA  Anesthesia Plan Comments:       Anesthesia Quick Evaluation

## 2021-08-10 NOTE — Interval H&P Note (Signed)
History and Physical Interval Note:  08/10/2021 8:07 AM  Cheryl Jordan  has presented today for surgery, with the diagnosis of Anemia and black stool.  The various methods of treatment have been discussed with the patient and family. After consideration of risks, benefits and other options for treatment, the patient has consented to  Procedure(s): ESOPHAGOGASTRODUODENOSCOPY (EGD) WITH PROPOFOL (N/A) as a surgical intervention.  The patient's history has been reviewed, patient examined, no change in status, stable for surgery.  I have reviewed the patient's chart and labs.  Questions were answered to the patient's satisfaction.     Daryel November

## 2021-08-10 NOTE — TOC Initial Note (Signed)
Transition of Care [facility]) - Initial/Assessment Note    Patient Details  Name: Cheryl Jordan MRN: [id number] Date of Birth: 04/01/1941  Transition of Care [facility]) CM/SW Contact:    Lynnell Catalan, RN Phone Number: 08/10/2021, 1:19 PM  Clinical Narrative:                   Transition of Care (TOC) Screening Note   Patient Details  Name: Cheryl Jordan Date of Birth: 03-18-1941   Transition of Care [facility]) CM/SW Contact:    Lynnell Catalan, RN Phone Number: 08/10/2021, 1:19 PM    Transition of Care Department (TOC) has reviewed patient and no TOC needs have been identified at this time. We will continue to monitor patient advancement through interdisciplinary progression rounds. If new patient transition needs arise, please place a TOC consult.       Activities of Daily Living Home Assistive Devices/Equipment: Eyeglasses, Cane (specify quad or straight) ADL Screening (condition at time of admission) Patient's cognitive ability adequate to safely complete daily activities?: Yes Is the patient deaf or have difficulty hearing?: No Does the patient have difficulty seeing, even when wearing glasses/contacts?: No Does the patient have difficulty concentrating, remembering, or making decisions?: No Patient able to express need for assistance with ADLs?: Yes Does the patient have difficulty dressing or bathing?: No Independently performs ADLs?: Yes (appropriate for developmental age) Does the patient have difficulty walking or climbing stairs?: Yes Weakness of Legs: Both Weakness of Arms/Hands: Both     Admission diagnosis:  Nausea [R11.0] AKI (acute kidney injury) ([place]) [N17.9] Diarrhea, unspecified type [R19.7] Patient Active Problem List   Diagnosis Date Noted   GAVE (gastric antral vascular ectasia)    Iron deficiency anemia due to chronic blood loss    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[place])    Portal hypertensive gastropathy ([place])    Intractable nausea and  vomiting 08/09/2021   Pancytopenia ([place]) 08/09/2021   Diarrhea 08/08/2021   Protein-calorie malnutrition, severe 08/08/2021   Hepatoma ([place]) 03/08/2021   Volume overload 07/04/2020   Other cirrhosis of liver ([place])    Deep venous thrombosis ([place])    Acute blood loss anemia    Colon ulcer    GI bleed 06/30/2020   GI bleeding 06/16/2020   GIB (gastrointestinal bleeding) 06/15/2020   SOB (shortness of breath) 05/10/2020   Symptomatic anemia 05/10/2020   Enteritis 04/29/2020   Acute renal failure superimposed on stage 3b chronic kidney disease ([place]) 04/29/2020   Abnormal finding on imaging of liver 02/01/2019   Malignant neoplasm of colon ([place]) 08/11/2015   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[place]) 08/11/2015   Adenocarcinoma of colon ([place]) 01/05/2015   Hypertension associated with diabetes ([place]) 12/17/2013   Arthritis 12/17/2013   Hypothyroidism 10/16/2011   GERD (gastroesophageal reflux disease) 10/16/2011   PCP:  Wannetta Sender, FNP Pharmacy:   [facility] [address] [place] [address] Phone: [phone] Fax: [phone]  [id number] - [place], [place] [address] [place] [place] [address] Phone: [phone] Fax: [phone], [place], [place] [address] [place] [address] Phone: [phone] Fax: [place], [address] [place] [address] Phone: [phone] Fax: [phone]  [facility] ([place]) - [place], [address] [address] Phone: [phone] Fax: [phone]     Social Determinants of Health (SDOH) Interventions    Readmission Risk Interventions Readmission Risk Prevention Plan 08/10/2021 05/11/2020  Transportation Screening Complete Complete  PCP or Specialist Appt  within 5-7 Days - Complete  Home Care Screening - Complete  Medication Review ([place]) Complete -  PCP or Specialist appointment within 3-5 days of discharge Complete -  [place] or Home Care Consult Complete -  SW Recovery Care/Counseling Consult Complete -  Palliative Care Screening Not Applicable -  [place] Not Applicable -  Some recent data might be hidden

## 2021-08-10 NOTE — Progress Notes (Addendum)
Referring Physician(s): Rande Brunt  Supervising Physician: Ruthann Cancer  Patient Status:  [facility] ([facility]) - In-pt  Chief Complaint:  Recurrent ascites, cirrhosis, hepatocellular carcinoma/colon cancer  Subjective: Patient known to IR service from right liver lesion biopsy in 2018, multiple paracenteses, thermal ablation and biopsy of liver lesion on 03/08/2021.  She has a history of NASH cirrhosis, portal hypertension, splenomegaly, hepatocellular carcinoma, right lower extremity DVT, anemia, renal insufficiency, anxiety, aortic stenosis, arthritis, colon cancer in 2016, diabetes, GERD, gout, hypertension, hypothyroidism and osteoporosis.  She was recently admitted to [facility] with abdominal pain, diarrhea, nausea, vomiting and acute on chronic kidney injury.  Latest CT abdomen pelvis on 08/08/21 revealed:  1. No bowel obstruction, enteric contrast reaches the colon. 2. Equivocal Boswell thickening about the gastric cardia and gastric body, can be seen with gastritis or peptic ulcer disease. 3. Cirrhosis with portal hypertension, splenomegaly, and moderate volume abdominopelvic ascites. Low-density in the right hepatic lobe corresponds to ablation defect on prior MRI. The additional 1 cm right lobe liver lesion on MRI is not well seen. Limited assessment for liver lesions on this unenhanced exam. 4. Chronic left pleural effusion, increased from prior abdominal CT. Trace right pleural effusion, new from prior. 5. Minimal sigmoid diverticulosis without diverticulitis. 6. Bilateral renal parenchymal thinning and atrophy with multiple renal cysts. No hydronephrosis.   Aortic Atherosclerosis  EGD today revealed grade 2 esophageal varices, portal hypertensive gastropathy, multiple gastric polyps, gastric antral vascular ectasia without bleeding treated with argon plasma coagulation.  Patient has had some dark-colored stools for the past week and a half following prednisone use. She has had recurrent ascites  for about a year now.  Current labs include PT 17.6, INR 1.5, creatinine 3.2 ,total bilirubin 2.4, hemoglobin 8.6, platelets 124k. MELD score as of today- 25.  Request now received from GI team to assess patient for possible TIPS.  She  denies fever, headache, chest pain, worsening dyspnea, abdominal/back pain, nausea, vomiting or bleeding at this time.   Past Medical History:  Diagnosis Date   Allergy    Anemia    hx of   Anxiety    Aortic stenosis    mild by 04/08/20 echo   Arthritis    Colon cancer ([place]) dx'd 11/2014   Diabetes mellitus without complication (HCC)    TYPE II   Dyspnea    Fatty liver    Gallbladder disease    GERD (gastroesophageal reflux disease)    Heart murmur    History of gout    Hypertension    Hypothyroidism    Kidney disorder    spot on left kidney   Osteoporosis    PONV (postoperative nausea and vomiting)    Past Surgical History:  Procedure Laterality Date   BIOPSY  06/17/2020   Procedure: BIOPSY;  Surgeon: Irving Copas., MD;  Location: [facility];  Service: Gastroenterology;;   CATARACT EXTRACTION Left    CHOLECYSTECTOMY     COLONOSCOPY WITH PROPOFOL N/A 06/17/2020   Procedure: COLONOSCOPY WITH PROPOFOL;  Surgeon: Irving Copas., MD;  Location: [facility];  Service: Gastroenterology;  Laterality: N/A;   COLONOSCOPY WITH PROPOFOL N/A 07/01/2020   Procedure: COLONOSCOPY WITH PROPOFOL;  Surgeon: Mauri Pole, MD;  Location: [facility];  Service: Endoscopy;  Laterality: N/A;   ESOPHAGOGASTRODUODENOSCOPY (EGD) WITH PROPOFOL N/A 06/17/2020   Procedure: ESOPHAGOGASTRODUODENOSCOPY (EGD) WITH PROPOFOL;  Surgeon: Rush Landmark Telford Nab., MD;  Location: [facility];  Service: Gastroenterology;  Laterality: N/A;   ESOPHAGOGASTRODUODENOSCOPY (EGD) WITH PROPOFOL N/A 07/01/2020  Procedure: ESOPHAGOGASTRODUODENOSCOPY (EGD) WITH PROPOFOL;  Surgeon: Mauri Pole, MD;  Location: [facility];  Service: Endoscopy;  Laterality:  N/A;   FOREIGN BODY REMOVAL  06/17/2020   Procedure: FOREIGN BODY REMOVAL;  Surgeon: Rush Landmark Telford Nab., MD;  Location: [facility];  Service: Gastroenterology;;   GI RADIOFREQUENCY ABLATION  06/17/2020   Procedure: GI RADIOFREQUENCY ABLATION;  Surgeon: Irving Copas., MD;  Location: [facility];  Service: Gastroenterology;;   HEMOSTASIS CLIP PLACEMENT  06/17/2020   Procedure: HEMOSTASIS CLIP PLACEMENT;  Surgeon: Irving Copas., MD;  Location: [facility];  Service: Gastroenterology;;   HEMOSTASIS CLIP PLACEMENT  07/01/2020   Procedure: HEMOSTASIS CLIP PLACEMENT;  Surgeon: Mauri Pole, MD;  Location: [facility];  Service: Endoscopy;;   HOT HEMOSTASIS N/A 07/01/2020   Procedure: HOT HEMOSTASIS (ARGON PLASMA COAGULATION/BICAP);  Surgeon: Mauri Pole, MD;  Location: [facility];  Service: Endoscopy;  Laterality: N/A;   IR PARACENTESIS  08/19/2020   IR PARACENTESIS  09/26/2020   IR PARACENTESIS  11/03/2020   IR PARACENTESIS  11/28/2020   IR PARACENTESIS  12/21/2020   IR PARACENTESIS  01/17/2021   IR PARACENTESIS  02/08/2021   IR PARACENTESIS  02/23/2021   IR PARACENTESIS  03/22/2021   IR PARACENTESIS  04/06/2021   IR PARACENTESIS  04/20/2021   IR PARACENTESIS  05/03/2021   IR PARACENTESIS  05/16/2021   IR PARACENTESIS  05/29/2021   IR PARACENTESIS  06/07/2021   IR PARACENTESIS  06/15/2021   IR PARACENTESIS  06/28/2021   IR PARACENTESIS  07/05/2021   IR PARACENTESIS  07/14/2021   IR PARACENTESIS  07/21/2021   IR PARACENTESIS  08/01/2021   IR RADIOLOGIST EVAL & MGMT  01/24/2017   IR RADIOLOGIST EVAL & MGMT  02/22/2021   IR RADIOLOGIST EVAL & MGMT  03/28/2021   IR RADIOLOGIST EVAL & MGMT  06/06/2021   KNEE CARTILAGE SURGERY Bilateral    NEPHRECTOMY Left 02/10/2015   Procedure: OPEN RETROPERINTONEAL EXPLORATION LEFT RENAL  CYST DECORTICATION X 5;  Surgeon: Cleon Gustin, MD;  Location: [facility];  Service: Urology;  Laterality: Left;   PARTIAL  COLECTOMY N/A 02/10/2015   Procedure: OPEN RIGHT  COLECTOMY ;  Surgeon: [name], MD;  Location: [facility];  Service: General;  Laterality: N/A;   POLYPECTOMY  06/17/2020   Procedure: POLYPECTOMY;  Surgeon: Irving Copas., MD;  Location: [facility];  Service: Gastroenterology;;   RADIOLOGY WITH ANESTHESIA N/A 03/08/2021   Procedure: CT MICROWAVE ABLATION WITH ANESTHESIA;  Surgeon: Arne Cleveland, MD;  Location: [facility];  Service: Radiology;  Laterality: N/A;   TUBAL LIGATION       Allergies: Aspirin and Codeine  Medications: Prior to Admission medications   Medication Sig Start Date End Date Taking? Authorizing Provider  acetaminophen (TYLENOL) 500 MG tablet Take 500 mg by mouth daily as needed for moderate pain.   Yes [provider]  amLODipine (NORVASC) 2.5 MG tablet Take 1 tablet (2.5 mg total) by mouth daily. 12/04/19  Yes Brunetta Jeans, PA-C  furosemide (LASIX) 40 MG tablet Take 1.5 tablets (60 mg total) by mouth daily. Patient taking differently: Take 40 mg by mouth daily. 09/21/20  Yes [name], MD  glipiZIDE (GLUCOTROL XL) 5 MG 24 hr tablet Take 5 mg by mouth daily with breakfast.   Yes [provider]  levothyroxine (SYNTHROID) 88 MCG tablet Take 1 tablet (88 mcg total) by mouth daily. 12/04/19  Yes Brunetta Jeans, PA-C  loperamide (IMODIUM) 2 MG capsule Take  2 mg by mouth as needed for diarrhea or loose stools.   Yes [provider]  LORazepam (ATIVAN) 0.5 MG tablet Take 0.5 mg by mouth 2 (two) times daily as needed for anxiety.   Yes [provider]  omeprazole (PRILOSEC) 20 MG capsule Take 1 capsule (20 mg total) by mouth 2 (two) times daily before a meal. 08/10/20  Yes Brunetta Jeans, PA-C  ondansetron (ZOFRAN) 4 MG tablet Take 1-2 tablets 20 minutes before meals Patient taking differently: Take 4-8 mg by mouth every 6 (six) hours as needed for nausea or vomiting. Take 1-2 tablets 20 minutes before meals 05/11/21  Yes  [name], MD  propranolol (INDERAL) 40 MG tablet Take 20 mg by mouth 2 (two) times daily. 09/30/20  Yes [provider]  Simethicone (GAS-X PO) Take 1 tablet by mouth as needed (flatulence).   Yes [provider]  Vitamin D, Ergocalciferol, (DRISDOL) 1.25 MG (50000 UNIT) CAPS capsule Take 50,000 Units by mouth once a week. Saturday 06/01/21  Yes [provider]  [place] test strip  10/20/18   [provider]  Blood Glucose Calibration (ACCU-CHEK AVIVA) SOLN     [provider]  rivaroxaban (XARELTO) 20 MG TABS tablet Take 1 tablet (20 mg total) by mouth daily with supper. Patient not taking: Reported on 08/04/2020 07/08/20 08/10/20  Eugenie Filler, MD     Vital Signs: BP 128/89 (BP Location: Left Arm)    Pulse 83    Temp 97.9 F (36.6 C) (Oral)    Resp 20    Wt 186 lb 1.1 oz (84.4 kg)    SpO2 94%    BMI 29.14 kg/m   Physical Exam awake, alert.  Chest with diminished breath sounds bases.  Heart with regular rate and rhythm.  Abdomen distended, soft, positive bowel sounds, currently nontender.  No lower extremity edema.  Imaging: CT ABDOMEN PELVIS WO CONTRAST  Result Date: 08/08/2021 CLINICAL DATA:  Nausea/vomiting Bowel obstruction suspected Acute on chronic kidney disease.  Liver cirrhosis.  Diarrhea. EXAM: CT ABDOMEN AND PELVIS WITHOUT CONTRAST TECHNIQUE: Multidetector CT imaging of the abdomen and pelvis was performed following the standard protocol without IV contrast. RADIATION DOSE REDUCTION: This exam was performed according to the departmental dose-optimization program which includes automated exposure control, adjustment of the mA and/or kV according to patient size and/or use of iterative reconstruction technique. COMPARISON:  Abdominal MRI 05/31/2021.  Abdominal CT 12/13/2020 FINDINGS: Lower chest: Chronic left pleural effusion, increased from prior abdominal CT. There is a trace right pleural effusion, new. Associated  linear and passive atelectasis in the lung bases. Hepatobiliary: Cirrhotic hepatic morphology. Low-density within segment 7 of the liver corresponds to ablation defect on prior MRI. This is not well assessed on this unenhanced exam. The 1.1 cm lesion on MRI is not well seen on the current exam. Pneumobilia with air in the common bile duct and central intrahepatic ducts. Post cholecystectomy. Pancreas: Normal atrophy.  No ductal dilatation or inflammation. Spleen: Splenomegaly with spleen spanning 15.4 cm cranial caudal. Adrenals/Urinary Tract: No adrenal nodule. Bilateral renal parenchymal thinning and atrophy. No hydronephrosis. No renal calculi. Multiple bilateral renal cysts, including a hyperdense cyst in the mid upper left kidney, unchanged from prior exam. Multiple surgical clips adjacent to the left kidney unchanged. The urinary bladder is minimally distended. No bladder Ravan thickening. Stomach/Bowel: Equivocal Neises thickening about the gastric cardia and gastric body. There is no abnormal gastric distension. No bowel obstruction with administered enteric  contrast in the small bowel and to the level of the sigmoid colon. No significant small bowel inflammation. Prior right hemicolectomy. The appendix is not visualized, presumably absent. Mild sigmoid colonic diverticulosis. No diverticulitis. No obvious colonic inflammation, although the presence of ascites partially obscures detailed assessment. Vascular/Lymphatic: Aortic atherosclerosis. No aortic aneurysm. Multiple small retroperitoneal lymph nodes, not enlarged by size criteria. No definite enlarged lymph nodes in the abdomen or pelvis. Reproductive: Normal quiescent appearance of the uterus. No obvious adnexal mass, ascites the pelvis partially obscures a voxel assessment. Other: Moderate volume abdominopelvic ascites. Mild generalized mesenteric edema with small amount of mesenteric ascites. No free air or loculated fluid collection. Musculoskeletal:  The bones are diffusely under mineralized. Prominent Schmorl's node within superior endplate of W25 is unchanged. There are no acute or suspicious osseous abnormalities. IMPRESSION: 1. No bowel obstruction, enteric contrast reaches the colon. 2. Equivocal Paulick thickening about the gastric cardia and gastric body, can be seen with gastritis or peptic ulcer disease. 3. Cirrhosis with portal hypertension, splenomegaly, and moderate volume abdominopelvic ascites. Low-density in the right hepatic lobe corresponds to ablation defect on prior MRI. The additional 1 cm right lobe liver lesion on MRI is not well seen. Limited assessment for liver lesions on this unenhanced exam. 4. Chronic left pleural effusion, increased from prior abdominal CT. Trace right pleural effusion, new from prior. 5. Minimal sigmoid diverticulosis without diverticulitis. 6. Bilateral renal parenchymal thinning and atrophy with multiple renal cysts. No hydronephrosis. Aortic Atherosclerosis (ICD10-I70.0). Electronically Signed   By: Keith Rake M.D.   On: 08/08/2021 15:54   DG Abdomen Acute W/Chest  Result Date: 08/07/2021 CLINICAL DATA:  Nausea and abdominal pain. History of colon cancer, hepatic cirrhosis and intractable ascites. Paracentesis performed last week. EXAM: DG ABDOMEN ACUTE WITH 1 VIEW CHEST COMPARISON:  Portable chest 03/02/2021, CT abdomen and pelvis no contrast 12/13/2020. FINDINGS: There is no evidence of dilated bowel loops or free intraperitoneal air. There is suspected to be at least mild-to-moderate volume ascites, as there is apparent medial displacement of the ascending and descending colon, which was seen on the prior CT. No radiopaque calculi or other significant radiographic abnormality is seen. There are cholecystectomy clips. Heart size and mediastinal contours are within normal limits. There is a low pulmonary inspiration with a small left pleural effusion similar to both prior studies, and atelectasis in the  hypoinflated lung bases. The visualized lungs are otherwise clear. IMPRESSION: 1. Small left pleural effusion appears similar to the prior studies. The lungs hypoinflated and otherwise generally clear except for lower zonal atelectasis. 2. No evidence of bowel obstruction or free air. Probable mild-to-moderate ascites based on medial displacement of the ascending and descending colon as seen on the prior CT. Electronically Signed   By: Telford Nab M.D.   On: 08/07/2021 21:46    Labs:  CBC: Recent Labs    08/07/21 2300 08/08/21 0221 08/09/21 0512 08/09/21 1848 08/10/21 0457  WBC 5.0 5.0 3.4*  --  4.1  HGB 8.3* 8.5* 6.7* 8.7* 8.6*  HCT 27.3* 27.6* 21.8* 27.4* 27.0*  PLT 164 167 122*  --  124*    COAGS: Recent Labs    03/02/21 1045 08/07/21 1510 08/08/21 0221 08/10/21 0457  INR 1.1 1.2 1.3* 1.5*    BMP: Recent Labs    08/07/21 1510 08/08/21 0221 08/09/21 0512 08/10/21 0457  NA 136 137 138 136  K 4.6 4.2 4.0 4.1  CL 108 107 111 110  CO2 21* 20* 19* 19*  GLUCOSE 76 104* 86 92  BUN 52* 51* 47* 43*  CALCIUM 8.4* 8.3* 8.3* 8.3*  CREATININE 4.19* 4.09* 3.37* 3.20*  GFRNONAA 10* 11* 13* 14*    LIVER FUNCTION TESTS: Recent Labs    08/07/21 1510 08/08/21 0221 08/09/21 0512 08/10/21 0457  BILITOT 1.2 1.4*   1.4* 1.2 2.4*  AST 17 15   17 15 15   ALT 9 10   9 9 9   ALKPHOS 99 86   88 66 72  PROT 5.8* 5.5*   5.6* 4.9* 4.8*  ALBUMIN 2.9* 2.8*   2.8* 2.8* 2.7*    Assessment and Plan: Patient known to IR service from right liver lesion biopsy in 2018, multiple paracenteses, thermal ablation and biopsy of liver lesion on 03/08/2021.  She has a history of NASH cirrhosis, portal hypertension, splenomegaly, hepatocellular carcinoma, right lower extremity DVT, anemia, renal insufficiency, anxiety, aortic stenosis, arthritis, colon cancer in 2016, diabetes, GERD, gout, hypertension, hypothyroidism and osteoporosis.  She was recently admitted to [facility] with abdominal pain, diarrhea,  nausea, vomiting and acute on chronic kidney injury.  Latest CT abdomen pelvis on 08/08/21 revealed:  1. No bowel obstruction, enteric contrast reaches the colon. 2. Equivocal Krol thickening about the gastric cardia and gastric body, can be seen with gastritis or peptic ulcer disease. 3. Cirrhosis with portal hypertension, splenomegaly, and moderate volume abdominopelvic ascites. Low-density in the right hepatic lobe corresponds to ablation defect on prior MRI. The additional 1 cm right lobe liver lesion on MRI is not well seen. Limited assessment for liver lesions on this unenhanced exam. 4. Chronic left pleural effusion, increased from prior abdominal CT. Trace right pleural effusion, new from prior. 5. Minimal sigmoid diverticulosis without diverticulitis. 6. Bilateral renal parenchymal thinning and atrophy with multiple renal cysts. No hydronephrosis.   Aortic Atherosclerosis  EGD today revealed grade 2 esophageal varices, portal hypertensive gastropathy, multiple gastric polyps, gastric antral vascular ectasia without bleeding treated with argon plasma coagulation.  Patient has had some dark-colored stools for the past week and a half following prednisone use. She has had recurrent ascites for about a year now.  Current labs include PT 17.6, INR 1.5, creatinine 3.2 ,total bilirubin 2.4, hemoglobin 8.6, platelets 124k. MELD score as of today- 25.  Request now received from GI team to assess patient for possible TIPS.  She  denies fever, headache, chest pain, worsening dyspnea, abdominal/back pain, nausea, vomiting or bleeding at this time.  Imaging studies and history have been reviewed by Dr. Serafina Royals.  Case discussed with Dr. Ardis Hughs.  Details and risks of TIPS along with indications for procedure were discussed briefly with patient today by Dr. Serafina Royals.  At this time we will plan to obtain echocardiogram along with MRI of the abdomen/MRCP to further evaluate patient's candidacy for TIPS.  Patient  will also follow-up with nephrology in the interim.  We will schedule patient for IR clinic visit to meet again with Dr. Serafina Royals and further discuss TIPS on 08/29/2021.   Electronically Signed: D. Rowe Robert, PA-C 08/10/2021, 2:21 PM   I spent a total of 30 minutes at the the patient's bedside AND on the patient's hospital floor or unit, greater than 50% of which was counseling/coordinating care for possible transjugular intrahepatic portosystemic shunt placement    Patient ID: Cheryl Jordan, female   DOB: [birth year], 81 y.o.   MRN: [id number]

## 2021-08-10 NOTE — Op Note (Signed)
[facility] Patient Name: Cheryl Jordan Procedure Date: 08/10/2021 MRN: [id number] Attending MD: Gladstone Pih. Candis Schatz , MD Date of Birth: 20-Jan-1941 CSN: [id number] Age: 81 Admit Type: Inpatient Procedure:                Upper GI endoscopy Indications:              Iron deficiency anemia secondary to chronic blood                            loss Providers:                Nicki Reaper E. Candis Schatz, MD Referring MD:              Medicines:                Monitored Anesthesia Care Complications:            No immediate complications. Estimated Blood Loss:     Estimated blood loss was minimal. Procedure:                Pre-Anesthesia Assessment:                           - Prior to the procedure, a History and Physical                            was performed, and patient medications and                            allergies were reviewed. The patient's tolerance of                            previous anesthesia was also reviewed. The risks                            and benefits of the procedure and the sedation                            options and risks were discussed with the patient.                            All questions were answered, and informed consent                            was obtained. Prior Anticoagulants: The patient has                            taken no previous anticoagulant or antiplatelet                            agents. ASA Grade Assessment: III - A patient with                            severe systemic disease. After reviewing the risks  and benefits, the patient was deemed in                            satisfactory condition to undergo the procedure.                           After obtaining informed consent, the endoscope was                            passed under direct vision. Throughout the                            procedure, the patient's blood pressure, pulse, and                            oxygen saturations were  monitored continuously. The                            [id number]-[id number] ([id number]) Olympus endoscope was introduced                            through the mouth, and advanced to the third part                            of duodenum. The upper GI endoscopy was                            accomplished without difficulty. The patient                            tolerated the procedure well. Scope In: Scope Out: Findings:      The examined portions of the nasopharynx, oropharynx and larynx were       normal.      Grade II varices were found in the lower third of the esophagus. They       were small in size. There were no stigmata of bleeding      The exam of the esophagus was otherwise normal.      Moderate portal hypertensive gastropathy was found in the gastric fundus       and in the gastric body. There was friability with contact oozing.      Multiple 2 to 8 mm sessile polyps with no stigmata of recent bleeding       were found in the gastric body, consistent with fundic gland polyps.      Mild gastric antral vascular ectasia without bleeding was present in the       gastric antrum. Coagulation for tissue destruction using argon plasma at       0.5 liters/minute and 20 watts was successful. Estimated blood loss was       minimal.      The examined duodenum was normal. Impression:               - The examined portions of the nasopharynx,                            oropharynx and larynx were  normal.                           - Grade II esophageal varices.                           - Portal hypertensive gastropathy.                           - Multiple gastric polyps.                           - Gastric antral vascular ectasia without bleeding.                            Treated with argon plasma coagulation (APC).                           - Normal examined duodenum.                           - No specimens collected.                           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                             PHG not amenable to ablation. Moderate Sedation:      Not Applicable - Patient had care per Anesthesia. Recommendation:           - Return patient to hospital ward for ongoing care.                           - Advance diet as tolerated.                           - Use Protonix (pantoprazole) 40 mg PO BID for 8                            weeks, then decrease to once daily indefinitely                           - Use sucralfate tablets 1 gram PO BID for 4 weeks.                           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                           - Continue iron supplementation Procedure Code(s):        ---  Professional ---                           [phone], Esophagogastroduodenoscopy, flexible,                            transoral; with ablation of tumor(s), polyp(s), or                            other lesion(s) (includes pre- and post-dilation                            and guide wire passage, when performed) Diagnosis Code(s):        --- Professional ---                           I85.00, Esophageal varices without bleeding                           K76.6, Portal hypertension                           K31.89, Other diseases of stomach and duodenum                           K31.7, Polyp of stomach and duodenum                           K31.819, Angiodysplasia of stomach and duodenum                            without bleeding                           D50.0, Iron deficiency anemia secondary to blood                            loss (chronic) CPT copyright 2019 [employer]. All rights reserved. The codes documented in this report are preliminary and upon coder review may  be revised to meet current compliance requirements. Merritt Mccravy E. Candis Schatz, MD 08/10/2021 9:09:46 AM This report has been signed  electronically. Number of Addenda: 0

## 2021-08-10 NOTE — Progress Notes (Signed)
Progress Note   [name] IWL:[id number] DOB: [birth year] DOA: 08/07/2021     3 DOS: the patient was seen and examined on 08/10/2021   Brief hospital course: No notes on file  Assessment and Plan * Acute renal failure superimposed on stage 3b chronic kidney disease (Cheryl Jordan)- (present on admission) Likely from poor p.o. intake in the setting of diarrhea as well as nausea and vomiting. To baseline serum creatinine around 2. On presentation serum creatinine around 4. Received IV fluids and IV albumin and transfusion. Renal function now improving.  Intractable nausea and vomiting- (present on admission) Resolved now.  May be gastritis.  Diarrhea- (present on admission) C diff Negative. GI pathogen negative CT abdomen negative for any acute abnormality.  Imodium PRN  Other cirrhosis of liver (Cheryl Jordan)- (present on admission) Recurrent ascites. Not a candidate for diuretics. Receives large-volume paracentesis although in the setting of acute kidney injury not a good candidate for that as well. GI recommended TIPS procedure.  Patient wants to discuss with the family.  Procedure will be scheduled outpatient.  IR requesting echocardiogram and MRCP. No evidence of decompensation. MEL D score 20+.  Adenocarcinoma of colon (Cheryl Jordan)- (present on admission) CT shows no evidence of acute worsening.  Symptomatic anemia- (present on admission) Associated with pancytopenia. Suspect this is dilutional in nature. Although patient does report some dark stool and therefore GI consulted. Continue PPI. Appreciate GI consultation.  Iron deficiency anemia due to chronic blood loss- (present on admission) GI was consulted. SP 1 Cheryl Jordan transfusion. Patient currently receiving PPI twice daily Underwent EGD which shows evidence of mild gastritis treated with APC. H&H now stable.  Hypothyroidism- (present on admission) Continue Synthroid.  GERD (gastroesophageal reflux disease)- (present on  admission) Continue PPI twice daily.  Protein-calorie malnutrition, severe- (present on admission) Body mass index is 29.14 kg/m.   Malnutrition Type: Nutrition Problem: Severe Malnutrition Etiology: chronic illness (colon cancer, cirrhosis) Nutrition Interventions: Interventions: MVI, Boost Breeze, Boost Plus  Hypertension associated with diabetes (Cheryl Jordan)- (present on admission) On Inderal and Norvasc.  In the setting of soft blood pressure currently holding all medication.  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Cheryl Jordan)- (present on admission) On glipizide outpatient.  Hemoglobin A1c 4.8. Currently sliding scale insulin only in the setting of hypoglycemia.     Subjective: No nausea or vomiting.  No diarrhea.  Tolerating oral diet.  No fever no chills.  Objective Vitals:   08/10/21 1100 08/10/21 1102 08/10/21 1200 08/10/21 1432  BP:    (!) 110/55  Pulse:    81  Resp: (!) 21 20 20 16   Temp:    98.7 F (37.1 C)  TempSrc:    Oral  SpO2:    93%  Weight:        General: Appear in mild distress, no Rash; Oral Mucosa Clear, moist. no Abnormal Neck Mass Or lumps, Conjunctiva normal  Cardiovascular: S1 and S2 Present, no Murmur, Respiratory: good respiratory effort, Bilateral Air entry present and CTA, no Crackles, no wheezes Abdomen: Bowel Sound present, Soft and distended, no tenderness Extremities: no Pedal edema Neurology: alert and oriented to time, place, and person affect appropriate. no new focal deficit Gait not checked due to patient safety concerns   Data Reviewed:  I have Reviewed nursing notes, Vitals, and Lab results since pt's last encounter. Pertinent lab results CBC shows stable hemoglobin.  CMP shows improving serum creatinine. I have ordered test including BMP and CBC I have ordered imaging studies echocardiogram and MRCP. discussed pt's  care plan and test results with IR and GI   Family Communication: None at bedside  Disposition: Status is:  Inpatient  Remains inpatient appropriate because: Monitor for stabilization of hemoglobin.  Awaiting completion of the work-up requested by IR providers.          Author: Berle Mull, MD 08/10/2021 7:02 PM  For on call review [email or web address].

## 2021-08-11 ENCOUNTER — Inpatient Hospital Stay (HOSPITAL_COMMUNITY): Payer: Medicare Other

## 2021-08-11 ENCOUNTER — Other Ambulatory Visit: Payer: Self-pay

## 2021-08-11 ENCOUNTER — Telehealth: Payer: Self-pay

## 2021-08-11 ENCOUNTER — Encounter (HOSPITAL_COMMUNITY): Payer: Self-pay | Admitting: Gastroenterology

## 2021-08-11 DIAGNOSIS — R011 Cardiac murmur, unspecified: Secondary | ICD-10-CM

## 2021-08-11 LAB — GLUCOSE, CAPILLARY
Glucose-Capillary: 90 mg/dL (ref 70–99)
Glucose-Capillary: 104 mg/dL — ABNORMAL HIGH (ref 70–99)
Glucose-Capillary: 101 mg/dL — ABNORMAL HIGH (ref 70–99)
Glucose-Capillary: 143 mg/dL — ABNORMAL HIGH (ref 70–99)

## 2021-08-11 LAB — COMPREHENSIVE METABOLIC PANEL
ALT: 8 U/L (ref 0–44)
AST: 13 U/L — ABNORMAL LOW (ref 15–41)
Albumin: 2.6 g/dL — ABNORMAL LOW (ref 3.5–5.0)
Alkaline Phosphatase: 65 U/L (ref 38–126)
Anion gap: 6 (ref 5–15)
BUN: 42 mg/dL — ABNORMAL HIGH (ref 8–23)
CO2: 20 mmol/L — ABNORMAL LOW (ref 22–32)
Calcium: 8.2 mg/dL — ABNORMAL LOW (ref 8.9–10.3)
Chloride: 110 mmol/L (ref 98–111)
Creatinine, Ser: 3.15 mg/dL — ABNORMAL HIGH (ref 0.44–1.00)
GFR, Estimated: 14 mL/min — ABNORMAL LOW (ref >60)
Glucose, Bld: 94 mg/dL (ref 70–99)
Potassium: 4.5 mmol/L (ref 3.5–5.1)
Sodium: 136 mmol/L (ref 135–145)
Total Bilirubin: 1.3 mg/dL — ABNORMAL HIGH (ref 0.3–1.2)
Total Protein: 4.6 g/dL — ABNORMAL LOW (ref 6.5–8.1)

## 2021-08-11 LAB — CBC WITH DIFFERENTIAL/PLATELET
Abs Immature Granulocytes: 0.01 10*3/uL (ref 0.00–0.07)
Basophils Absolute: 0 10*3/uL (ref 0.0–0.1)
Basophils Relative: 0 %
Eosinophils Absolute: 0.1 10*3/uL (ref 0.0–0.5)
Eosinophils Relative: 2 %
HCT: 26.6 % — ABNORMAL LOW (ref 36.0–46.0)
Hemoglobin: 8.1 g/dL — ABNORMAL LOW (ref 12.0–15.0)
Immature Granulocytes: 0 %
Lymphocytes Relative: 26 %
Lymphs Abs: 1.3 10*3/uL (ref 0.7–4.0)
MCH: 28.7 pg (ref 26.0–34.0)
MCHC: 30.5 g/dL (ref 30.0–36.0)
MCV: 94.3 fL (ref 80.0–100.0)
Monocytes Absolute: 0.4 10*3/uL (ref 0.1–1.0)
Monocytes Relative: 8 %
Neutro Abs: 3.1 10*3/uL (ref 1.7–7.7)
Neutrophils Relative %: 64 %
Platelets: 119 10*3/uL — ABNORMAL LOW (ref 150–400)
RBC: 2.82 MIL/uL — ABNORMAL LOW (ref 3.87–5.11)
RDW: 16 % — ABNORMAL HIGH (ref 11.5–15.5)
WBC: 4.9 10*3/uL (ref 4.0–10.5)
nRBC: 0 % (ref 0.0–0.2)

## 2021-08-11 LAB — BODY FLUID CELL COUNT WITH DIFFERENTIAL
Eos, Fluid: 0 %
Lymphs, Fluid: 45 %
Monocyte-Macrophage-Serous Fluid: 35 % — ABNORMAL LOW (ref 50–90)
Neutrophil Count, Fluid: 20 % (ref 0–25)
Total Nucleated Cell Count, Fluid: 233 cu mm (ref 0–1000)

## 2021-08-11 LAB — MAGNESIUM: Magnesium: 2.3 mg/dL (ref 1.7–2.4)

## 2021-08-11 LAB — ECHOCARDIOGRAM COMPLETE
AR max vel: 1.16 cm2
AV Area VTI: 1.36 cm2
AV Area mean vel: 1.14 cm2
AV Mean grad: 17 mmHg
AV Peak grad: 31.4 mmHg
Ao pk vel: 2.8 m/s
Area-P 1/2: 2.56 cm2
Calc EF: 60.4 %
S' Lateral: 1.9 cm
Single Plane A2C EF: 58.6 %
Single Plane A4C EF: 61.7 %
Weight: 2977.09 oz

## 2021-08-11 MED ORDER — LIDOCAINE HCL 1 % IJ SOLN
INTRAMUSCULAR | Status: AC
  Administered 2021-08-11: 15 mL
  Filled 2021-08-11: qty 20

## 2021-08-11 MED ORDER — BENZONATATE 100 MG PO CAPS: 100.0000 mg | ORAL_CAPSULE | Freq: Three times a day (TID) | ORAL | 0 refills | Status: DC | PRN

## 2021-08-11 MED ORDER — ALBUMIN HUMAN 25 % IV SOLN
50.0000 g | Freq: Once | INTRAVENOUS | Status: AC
  Administered 2021-08-11: 25 g via INTRAVENOUS
  Filled 2021-08-11: qty 200

## 2021-08-11 MED ORDER — SUCRALFATE 1 G PO TABS: 1.0000 g | ORAL_TABLET | Freq: Two times a day (BID) | ORAL | 0 refills | Status: DC

## 2021-08-11 MED ORDER — GUAIFENESIN-DM 100-10 MG/5ML PO SYRP: 5.0000 mL | ORAL_SOLUTION | ORAL | 0 refills | Status: DC | PRN

## 2021-08-11 MED ORDER — ONDANSETRON HCL 4 MG PO TABS: 4.0000 mg | ORAL_TABLET | Freq: Every day | ORAL | 1 refills | Status: DC | PRN

## 2021-08-11 MED ORDER — BOOST PLUS PO LIQD
237.0000 mL | Freq: Three times a day (TID) | ORAL | Status: DC
  Filled 2021-08-11 (×3): qty 237

## 2021-08-11 MED ORDER — BOOST PLUS PO LIQD: 237.0000 mL | Freq: Three times a day (TID) | ORAL | 0 refills | Status: AC

## 2021-08-11 MED ORDER — PANTOPRAZOLE SODIUM 40 MG PO TBEC: 40.0000 mg | DELAYED_RELEASE_TABLET | Freq: Two times a day (BID) | ORAL | 0 refills | Status: DC

## 2021-08-11 NOTE — Progress Notes (Addendum)
[facility] Progress Note  CC:  Cirrhosis, ascites    Subjective: She denies having any CP or SOB. No  N/V. She has not eaten yet today as she is NPO for MRI/MRCP, transport at bedside to take patient down for MRI at this time. No bloody bowel movements.   Objective:   EGD by Dr. Candis Schatz 08/10/2021: - The examined portions of the nasopharynx, oropharynx and larynx were normal. - Grade II esophageal varices. - Portal hypertensive gastropathy. - Multiple gastric polyps. - Gastric antral vascular ectasia without bleeding. Treated with argon plasma coagulation (APC). - Normal examined duodenum. - No specimens collected. 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 PHG not amenable to ablation.  Vital signs in last 24 hours: Temp:  [97.9 F (36.6 C)-98.7 F (37.1 C)] 98.4 F (36.9 C) (01/27 0355) Pulse Rate:  [81-90] 90 (01/27 0355) Resp:  [16-21] 20 (01/27 0355) BP: (96-128)/(55-89) 96/68 (01/27 0355) SpO2:  [93 %-94 %] 94 % (01/27 0355) Last BM Date: 08/07/21 General:   Alert 81 year old female in NAD. Heart: RRR, systolic murmur.  Pulm: Breath sounds clear throughout.  Abdomen: Abdomen is distended but not tense with ascites. Nontender. + BS x 4 quads.  No palpable mass. Extremities: No edema. Neurologic:  Alert and  oriented x4.  No asterixis.  Speech is clear.  Moves all extremities equally. Psych:  Alert and cooperative. Normal mood and affect.   Lab Results: Recent Labs    08/09/21 0512 08/09/21 1848 08/10/21 0457 08/11/21 0523  WBC 3.4*  --  4.1 4.9  HGB 6.7* 8.7* 8.6* 8.1*  HCT 21.8* 27.4* 27.0* 26.6*  PLT 122*  --  124* 119*   BMET Recent Labs    08/09/21 0512 08/10/21 0457 08/11/21 0523  NA 138 136 136  K 4.0 4.1 4.5  CL 111 110 110  CO2 19* 19* 20*  GLUCOSE 86 92 94  BUN 47* 43* 42*  CREATININE 3.37* 3.20* 3.15*  CALCIUM 8.3* 8.3* 8.2*   LFT Recent Labs    08/10/21 0457 08/11/21 0523  PROT 4.8* 4.6*  ALBUMIN  2.7* 2.6*  AST 15 13*  ALT 9 8  ALKPHOS 72 65  BILITOT 2.4* 1.3*  BILIDIR 0.9*  --    PT/INR Recent Labs    08/10/21 0457  LABPROT 17.6*  INR 1.5*   Assessment / Plan:  70) 81 year old female with decompensated NASH cirrhosis with portal HTN, ascites requiring LVP Q 1 to 3 weeks x 2 years and HCC (right liver lesion) s/p biopsy and thermal ablation of liver lesion March 08, 2021.  Pathology revealed well to moderately differentiated hepatocellular carcinoma. Last paracentesis was 08/01/2021, 7.2 L of peritoneal fluid was removed. No SBP.  Admitted to the hospital 08/09/2021 with N/V/D and abdominal pain. CTAP 08/08/2021 showed cirrhosis with portal hypertension, splenomegaly and moderate volume of ascites.  Right hepatic lobe density corresponded to prior ablation defect. Alk phos 65. T. Bili 1.3. AST 13. ALT 8. Paracentesis ordered by Dr. Posey Pronto. -Patient to proceed with paracentesis (max fluid 3L) with IV albumin as ordered by Dr. Posey Pronto.  Peritoneal fluid labs to include cell count with differential, gram stain and aerobic/anaerobic cultures. -2 g low-sodium diet -Not a candidate for diuretics secondary to acute on chronic disease, past hyperkalemia on Spironolactone  2) Acute on chronic anemia, multifactorial.  IDA requiring IV iron followed by hematology.  Reported dark stools.  Admission Hg 8.5 -> 6.7 -> transfused  1 unit of PRBCs on 1/25. Today Hg 8.1. CTAP 1/24 showed gastric [place] thickening. S/P EGD 08/10/2021 showed grade II esophageal varices, portal hypertensive gastropathy, gastric antral vascular ectasia without bleeding treated with APC. Unclear if bleeding was secondary to PHG vs GAVE. PHG not amenable to ablation. TIPS procedure to  reduce bleeding from [place] and GAVE recommended by Dr. Candis Schatz. ECHO done 08/10/2021, results pending. Abdominal MRI/MRCP requested per IR, in process. Hg 8.6 -> 8.1. Last BM was yesterday, no rectal bleeding or black stools.  -Pantoprazole 2m po bid x  8 weeks then QD indefinitely  -Carafate 1 gm bid (renal dosing)  x 4 weeks -Continue oral iron indefinitely  3) AKI. Admission Cr 4.19 -> Cr 3.29 -> today 3.15 ( Cr 2.46 on 03/21/2021).  4) Thrombocytopenia secondary to cirrhosis and splenomegaly  5) Coagulopathy secondary to cirrhosis.  INR 1.5 on 08/10/2021  6) Diarrhea, resolved. C. Diff negative.  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  I provided a substantive portion of the care of this patient (personally provided more than half of the total time dedicated to the treatment of this patient). She is interesting in going home today and continuing to move ahead with outpatient workup for elective TIPS for refractory ascites. She should continue her usual home meds, she should be on PPI BID for 2 months then once daily indefinitely. Once daily iron indefinitely.  [facility] will help coordinate outpatient visits with her nephrologist, with Dr. PHenrene Pastorand with IR. She will continue periodic outpatient palliative LVPs for now.  Please call or page with any further questions or concerns.   DOwens Loffler MD [facility] Pager [phone]

## 2021-08-11 NOTE — Telephone Encounter (Signed)
Called [facility] to schedule follow up appt to see if pt is a TIPS candidate. They said they would send the note to Dr. Royce Macadamia and see if pt would need an appt.

## 2021-08-11 NOTE — Procedures (Signed)
Ultrasound-guided diagnostic and therapeutic paracentesis performed yielding 3 liters (maximum ordered) of slightly hazy, yellow fluid. No immediate complications. A portion of the fluid was sent to the lab for preordered studies. EBL none.

## 2021-08-11 NOTE — Telephone Encounter (Signed)
Request for outpatient IR appt already in epic.

## 2021-08-11 NOTE — Plan of Care (Signed)
°  Problem: Clinical Measurements: Goal: Ability to maintain clinical measurements within normal limits will improve Outcome: Progressing   Problem: Clinical Measurements: Goal: Diagnostic test results will improve Outcome: Progressing   Problem: Nutrition: Goal: Adequate nutrition will be maintained Outcome: Progressing   Problem: Coping: Goal: Level of anxiety will decrease Outcome: Progressing

## 2021-08-11 NOTE — Progress Notes (Signed)
° °  Echocardiogram 2D Echocardiogram has been performed.  Cheryl Jordan 08/11/2021, 2:04 PM

## 2021-08-11 NOTE — Telephone Encounter (Signed)
-----   Message from [name], MD sent at 08/11/2021 12:33 PM EST ----- Thanks [name]!  As you may know, I referred her to nephrology previously. Not a lot of help. Her [name] seems under control post ablation, for now. I have concerns about potential complete renal failure post TIPS in this 81 yo patient with Cr. > 3. A little out of my comfort zone. You?  [name], 1. Get her follow up with her nephrologist "is she a TIPS candidate?" 2. Make sure she has IR outpatient appointment "is she a TIPS candidate?" 3. See me after the above completed  Thanks,  JP ----- Message ----- From: [name], MD Sent: 08/11/2021   7:59 AM EST To: [name], MD  [name]  She's in hospital with acute on chronic anemia.  Probably d/c soon.  I mentioned TIPS to her to help with her referactory ascites and possibly also her CRI.  She's certainly interested in learning more about it.  IR saw her and think she is probably a good candidate but needed some further testing.  Certainly not an emergent or even urgent situation and she'd like to take it slow.  Will probably have her sit down with her nephrologist as an outpatient, ROV with you as an outpatient and also IR will see her in their outpatient clinic so that everyone could be on the same page if she agrees to proceed.    Any chance you could see her in the next few weeks to keep things moving if you and she are both agreeable (vs. Continuing LVPs)  Thanks DJ

## 2021-08-14 ENCOUNTER — Telehealth: Payer: Self-pay

## 2021-08-14 LAB — CYTOLOGY - NON PAP

## 2021-08-14 NOTE — Telephone Encounter (Signed)
[facility] returning your call, please advise.

## 2021-08-14 NOTE — Telephone Encounter (Signed)
Great. Have the doctor send formal correspondence in that regard. Thanks

## 2021-08-14 NOTE — Telephone Encounter (Signed)
Spoke with CMA for Dr. Harrie Jeans. Per Dr. Royce Macadamia if pt is approved by IR for TIPS procedure then Dr. Royce Macadamia is fine with her having the TIPS done. She does not think she needs to see pt. Dr. Henrene Pastor aware.

## 2021-08-14 NOTE — Discharge Summary (Signed)
Physician Discharge Summary   [name] MRN: [id number] DOB: 02/09/1941  Admit date:     08/07/2021  Discharge date: 08/11/2021  Discharge Physician: Berle Mull   PCP: Wannetta Sender, FNP   Recommendations at discharge:   Follow-up with IR as recommended to discuss about TIPS. Follow-up with GI. 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 [facility]   * Acute renal failure superimposed on stage 3b chronic kidney disease ([place])- (present on admission) Likely from poor p.o. intake in the setting of diarrhea as well as nausea and vomiting. To baseline serum creatinine around 2. On presentation serum creatinine around 4. Received IV fluids and IV albumin and transfusion. Renal function now improving.  Intractable nausea and vomiting- (present on admission) Resolved now.  May be gastritis.  Diarrhea- (present on admission) C diff Negative. GI pathogen negative CT abdomen negative for any acute abnormality.  Imodium PRN  Other cirrhosis of liver ([place])- (present on admission) Recurrent ascites. Portal hypertensive gastropathy. G AVE Secondary esophageal varices  Not a candidate for aggressive diuretic therapy. Receives large-volume paracentesis although in  the setting of acute kidney injury not a good candidate for that as well. GI recommended TIPS procedure.  Patient wants to discuss with the family.  Procedure will be scheduled outpatient.  IR requesting echocardiogram and MRCP. No evidence of decompensation. MEL D score 20+.  Adenocarcinoma of colon ([place])- (present on admission) CT shows no evidence of acute worsening.  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 GI was consulted. SP 1 PRBC transfusion. Patient currently receiving PPI twice daily Underwent EGD which shows evidence of mild gastritis treated with APC. H&H now stable.  Hypothyroidism- (present on admission) Continue Synthroid.  GERD (gastroesophageal reflux disease)- (present on admission) Continue PPI twice daily.  Protein-calorie malnutrition, severe- (present on admission) Body mass index is 29.14 kg/m.   Malnutrition Type: Nutrition Problem: Severe Malnutrition Etiology: chronic illness (colon cancer, cirrhosis) Nutrition Interventions: Interventions: MVI, Boost Breeze, Boost Plus  Hypertension associated with diabetes ([place])- (present on admission) On Inderal and Norvasc.  In the setting of soft blood pressure currently holding all medication.  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  Follow-up with PCP, consider stopping this medication outpatient.      Consultants: GI, IR Procedures performed: Paracentesis, EGD Disposition: Home health Diet recommendation: Cardiac diet  DISCHARGE MEDICATION: Allergies as of 08/11/2021       Reactions   Aspirin Other (See Comments)   Runny nose  Other reaction(s): runny nose   Codeine Nausea And Vomiting   Other reaction(s): nausea, voiting        Medication List     STOP taking these medications    Accu-Chek Aviva Soln  amLODipine 2.5 MG tablet Commonly known as: NORVASC   furosemide 40 MG  tablet Commonly known as: LASIX   omeprazole 20 MG capsule Commonly known as: PriLOSEC   propranolol 40 MG tablet Commonly known as: INDERAL       TAKE these medications    Accu-Chek Aviva Plus test strip Generic drug: glucose blood   acetaminophen 500 MG tablet Commonly known as: TYLENOL Take 500 mg by mouth daily as needed for moderate pain.   benzonatate 100 MG capsule Commonly known as: Tessalon Perles Take 1 capsule (100 mg total) by mouth 3 (three) times daily as needed for cough.   GAS-X PO Take 1 tablet by mouth as needed (flatulence).   glipiZIDE 5 MG 24 hr tablet Commonly known as: GLUCOTROL XL Take 5 mg by mouth daily with breakfast.   guaiFENesin-dextromethorphan 100-10 MG/5ML syrup Commonly known as: ROBITUSSIN DM Take 5 mLs by mouth every 4 (four) hours as needed for cough.   lactose free nutrition Liqd Take 237 mLs by mouth 3 (three) times daily with meals.   levothyroxine 88 MCG tablet Commonly known as: SYNTHROID Take 1 tablet (88 mcg total) by mouth daily.   loperamide 2 MG capsule Commonly known as: IMODIUM Take 2 mg by mouth as needed for diarrhea or loose stools.   LORazepam 0.5 MG tablet Commonly known as: ATIVAN Take 0.5 mg by mouth 2 (two) times daily as needed for anxiety.   ondansetron 4 MG tablet Commonly known as: Zofran Take 1 tablet (4 mg total) by mouth daily as needed for nausea or vomiting. What changed:  how much to take how to take this when to take this reasons to take this additional instructions   pantoprazole 40 MG tablet Commonly known as: PROTONIX Take 1 tablet (40 mg total) by mouth 2 (two) times daily before a meal.   sucralfate 1 g tablet Commonly known as: CARAFATE Take 1 tablet (1 g total) by mouth 2 (two) times daily.   Vitamin D (Ergocalciferol) 1.25 MG (50000 UNIT) Caps capsule Commonly known as: DRISDOL Take 50,000 Units by mouth once a week. Saturday        Follow-up Information      Wannetta Sender, FNP. Schedule an appointment as soon as possible for a visit in 1 week(s).   Specialty: Family Medicine Why: with CBC and BMP Contact information: [place] [facility] [address] [place] [address] [phone]                 Discharge Exam: Danley Danker Weights   08/09/21 1000  Weight: 84.4 kg   Vitals:   08/10/21 1432 08/11/21 0355 08/11/21 1426 08/11/21 1457  BP: (!) 110/55 96/68 139/60 140/60  Pulse: 81 90    Resp: 16 20    Temp: 98.7 F (37.1 C) 98.4 F (36.9 C)    TempSrc: Oral Oral    SpO2: 93% 94%    Weight:        General: Appear in mild distress, no Rash; Oral Mucosa Clear, moist. no Abnormal Neck Mass Or lumps, Conjunctiva normal  Cardiovascular: S1 and S2 Present, no Murmur, Respiratory: good respiratory effort, Bilateral Air entry present and CTA, no Crackles, no wheezes Abdomen: Bowel Sound present, Soft and, distended, no tenderness Extremities: no Pedal edema Neurology: alert and oriented to time, place, and person affect appropriate. no new focal deficit Gait not checked due to patient safety concerns   Condition at discharge: good  The results of significant diagnostics from this hospitalization (including imaging, microbiology, ancillary and laboratory) are listed below for reference.   Imaging Studies: CT ABDOMEN PELVIS WO CONTRAST  Result Date: 08/08/2021 CLINICAL DATA:  Nausea/vomiting Bowel obstruction suspected Acute on chronic kidney disease.  Liver cirrhosis.  Diarrhea. EXAM: CT ABDOMEN AND PELVIS WITHOUT CONTRAST TECHNIQUE: Multidetector CT imaging of the abdomen and pelvis was performed following the standard protocol without IV contrast. RADIATION DOSE REDUCTION: This exam was performed according to the departmental dose-optimization program which includes automated exposure control, adjustment of the mA and/or kV according to patient size and/or use of iterative reconstruction  technique. COMPARISON:  Abdominal MRI 05/31/2021.  Abdominal CT 12/13/2020 FINDINGS: Lower chest: Chronic left pleural effusion, increased from prior abdominal CT. There is a trace right pleural effusion, new. Associated linear and passive atelectasis in the lung bases. Hepatobiliary: Cirrhotic hepatic morphology. Low-density within segment 7 of the liver corresponds to ablation defect on prior MRI. This is not well assessed on this unenhanced exam. The 1.1 cm lesion on MRI is not well seen on the current exam. Pneumobilia with air in the common bile duct and central intrahepatic ducts. Post cholecystectomy. Pancreas: Normal atrophy.  No ductal dilatation or inflammation. Spleen: Splenomegaly with spleen spanning 15.4 cm cranial caudal. Adrenals/Urinary Tract: No adrenal nodule. Bilateral renal parenchymal thinning and atrophy. No hydronephrosis. No renal calculi. Multiple bilateral renal cysts, including a hyperdense cyst in the mid upper left kidney, unchanged from prior exam. Multiple surgical clips adjacent to the left kidney unchanged. The urinary bladder is minimally distended. No bladder Bendickson thickening. Stomach/Bowel: Equivocal Desrochers thickening about the gastric cardia and gastric body. There is no abnormal gastric distension. No bowel obstruction with administered enteric contrast in the small bowel and to the level of the sigmoid colon. No significant small bowel inflammation. Prior right hemicolectomy. The appendix is not visualized, presumably absent. Mild sigmoid colonic diverticulosis. No diverticulitis. No obvious colonic inflammation, although the presence of ascites partially obscures detailed assessment. Vascular/Lymphatic: Aortic atherosclerosis. No aortic aneurysm. Multiple small retroperitoneal lymph nodes, not enlarged by size criteria. No definite enlarged lymph nodes in the abdomen or pelvis. Reproductive: Normal quiescent appearance of the uterus. No obvious adnexal mass, ascites the pelvis  partially obscures a voxel assessment. Other: Moderate volume abdominopelvic ascites. Mild generalized mesenteric edema with small amount of mesenteric ascites. No free air or loculated fluid collection. Musculoskeletal: The bones are diffusely under mineralized. Prominent Schmorl's node within superior endplate of H20 is unchanged. There are no acute or suspicious osseous abnormalities. IMPRESSION: 1. No bowel obstruction, enteric contrast reaches the colon. 2. Equivocal Prioleau thickening about the gastric cardia and gastric body, can be seen with gastritis or peptic ulcer disease. 3. Cirrhosis with portal hypertension, splenomegaly, and moderate volume abdominopelvic ascites. Low-density in the right hepatic lobe corresponds to ablation defect on prior MRI. The additional 1 cm right lobe liver lesion on MRI is not well seen. Limited assessment for liver lesions on this unenhanced exam. 4. Chronic left pleural effusion, increased from prior abdominal CT. Trace right pleural effusion, new from prior. 5. Minimal sigmoid diverticulosis without diverticulitis. 6. Bilateral renal parenchymal thinning and atrophy with multiple renal cysts. No hydronephrosis. Aortic Atherosclerosis (ICD10-I70.0). Electronically Signed   By: Keith Rake M.D.   On: 08/08/2021 15:54   MR ABDOMEN MRCP WO CONTRAST  Result Date: 08/11/2021 CLINICAL DATA:  Ascites, cirrhosis, hepatocellular carcinoma EXAM: MRI ABDOMEN WITHOUT CONTRAST  (INCLUDING MRCP) TECHNIQUE: Multiplanar multisequence MR imaging of the abdomen  was performed. Heavily T2-weighted images of the biliary and pancreatic ducts were obtained, and three-dimensional MRCP images were rendered by post processing. COMPARISON:  CT abdomen and pelvis 08/08/2021, MRI abdomen 05/31/2021 FINDINGS: Study is limited due to lack of contrast, motion and large volume ascites. Lower chest: Small right and moderate left pleural effusions. Cardiomegaly. Hepatobiliary: Liver is nodular with  inhomogeneous parenchyma consistent with cirrhosis. 3.5 x 2.4 cm heterogeneous mass in the right hepatic lobe consistent with previously ablated hepatocellular carcinoma which demonstrates peripheral hypointense T1, hyperintense T2 signal and central hyperintense T1, hypointense T2 signal. Mild surrounding hyperintense T2 signal hazy enhancement which extends towards the periphery/dome of the liver. No new hyperintense T2/DWI signal hepatic lesion identified. Gallbladder is surgically absent. No biliary ductal dilatation identified. Pancreas: Mildly atrophic with no obvious mass or ductal dilatation visualized. Spleen:  Enlarged measuring 16 cm in length. Adrenals/Urinary Tract: Adrenal glands appear grossly normal. Kidneys are mildly atrophic and lobulated left greater than right. Multiple hyperintense T2 signal likely cysts identified bilaterally, left greater than right. No hydronephrosis identified. Stomach/Bowel: No evidence of bowel obstruction. Centralization of bowel loops secondary to ascites. Vascular/Lymphatic: No bulky lymphadenopathy appreciated given limitations. No abdominal aortic aneurysm demonstrated. Other:  Large volume ascites. Musculoskeletal: No suspicious bony lesions identified. IMPRESSION: 1. Large volume ascites. 2. Hepatic cirrhosis. Previously ablated mass in the right hepatic lobe. No definite new hepatic mass visualized. 3. Splenomegaly consistent with portal hypertension. 4. Renal atrophy and renal cortical cysts. 5. Small right and moderate left pleural effusions.  Cardiomegaly. Electronically Signed   By: Ofilia Neas M.D.   On: 08/11/2021 12:05   MR 3D Recon At Scanner  Result Date: 08/11/2021 CLINICAL DATA:  Ascites, cirrhosis, hepatocellular carcinoma EXAM: MRI ABDOMEN WITHOUT CONTRAST  (INCLUDING MRCP) TECHNIQUE: Multiplanar multisequence MR imaging of the abdomen was performed. Heavily T2-weighted images of the biliary and pancreatic ducts were obtained, and  three-dimensional MRCP images were rendered by post processing. COMPARISON:  CT abdomen and pelvis 08/08/2021, MRI abdomen 05/31/2021 FINDINGS: Study is limited due to lack of contrast, motion and large volume ascites. Lower chest: Small right and moderate left pleural effusions. Cardiomegaly. Hepatobiliary: Liver is nodular with inhomogeneous parenchyma consistent with cirrhosis. 3.5 x 2.4 cm heterogeneous mass in the right hepatic lobe consistent with previously ablated hepatocellular carcinoma which demonstrates peripheral hypointense T1, hyperintense T2 signal and central hyperintense T1, hypointense T2 signal. Mild surrounding hyperintense T2 signal hazy enhancement which extends towards the periphery/dome of the liver. No new hyperintense T2/DWI signal hepatic lesion identified. Gallbladder is surgically absent. No biliary ductal dilatation identified. Pancreas: Mildly atrophic with no obvious mass or ductal dilatation visualized. Spleen:  Enlarged measuring 16 cm in length. Adrenals/Urinary Tract: Adrenal glands appear grossly normal. Kidneys are mildly atrophic and lobulated left greater than right. Multiple hyperintense T2 signal likely cysts identified bilaterally, left greater than right. No hydronephrosis identified. Stomach/Bowel: No evidence of bowel obstruction. Centralization of bowel loops secondary to ascites. Vascular/Lymphatic: No bulky lymphadenopathy appreciated given limitations. No abdominal aortic aneurysm demonstrated. Other:  Large volume ascites. Musculoskeletal: No suspicious bony lesions identified. IMPRESSION: 1. Large volume ascites. 2. Hepatic cirrhosis. Previously ablated mass in the right hepatic lobe. No definite new hepatic mass visualized. 3. Splenomegaly consistent with portal hypertension. 4. Renal atrophy and renal cortical cysts. 5. Small right and moderate left pleural effusions.  Cardiomegaly. Electronically Signed   By: Ofilia Neas M.D.   On: 08/11/2021 12:05   US  Paracentesis  Result Date: 08/11/2021 INDICATION: Patient with  prior history of colon cancer; now with cirrhosis, hepatocellular carcinoma, renal insufficiency, recurrent ascites. Request received for diagnostic and therapeutic paracentesis up to 3 liters. EXAM: ULTRASOUND GUIDED DIAGNOSTIC AND THERAPEUTIC PARACENTESIS MEDICATIONS: 10 mL 1% lidocaine COMPLICATIONS: None immediate. PROCEDURE: Informed written consent was obtained from the patient after a discussion of the risks, benefits and alternatives to treatment. A timeout was performed prior to the initiation of the procedure. Initial ultrasound scanning demonstrates a large amount of ascites within the right lower abdominal quadrant. The right lower abdomen was prepped and draped in the usual sterile fashion. 1% lidocaine was used for local anesthesia. Following this, a 19 gauge, 10-cm, Yueh catheter was introduced. An ultrasound image was saved for documentation purposes. The paracentesis was performed. The catheter was removed and a dressing was applied. The patient tolerated the procedure well without immediate post procedural complication. FINDINGS: A total of approximately 3 liters of slightly hazy, yellow fluid was removed. Samples were sent to the laboratory as requested by the clinical team. IMPRESSION: Successful ultrasound-guided diagnostic and therapeutic paracentesis yielding 3 liters of peritoneal fluid. Read by: Rowe Robert, PA-C Electronically Signed   By: Jerilynn Mages.  Shick M.D.   On: 08/11/2021 15:37   DG Abdomen Acute W/Chest  Result Date: 08/07/2021 CLINICAL DATA:  Nausea and abdominal pain. History of colon cancer, hepatic cirrhosis and intractable ascites. Paracentesis performed last week. EXAM: DG ABDOMEN ACUTE WITH 1 VIEW CHEST COMPARISON:  Portable chest 03/02/2021, CT abdomen and pelvis no contrast 12/13/2020. FINDINGS: There is no evidence of dilated bowel loops or free intraperitoneal air. There is suspected to be at least  mild-to-moderate volume ascites, as there is apparent medial displacement of the ascending and descending colon, which was seen on the prior CT. No radiopaque calculi or other significant radiographic abnormality is seen. There are cholecystectomy clips. Heart size and mediastinal contours are within normal limits. There is a low pulmonary inspiration with a small left pleural effusion similar to both prior studies, and atelectasis in the hypoinflated lung bases. The visualized lungs are otherwise clear. IMPRESSION: 1. Small left pleural effusion appears similar to the prior studies. The lungs hypoinflated and otherwise generally clear except for lower zonal atelectasis. 2. No evidence of bowel obstruction or free air. Probable mild-to-moderate ascites based on medial displacement of the ascending and descending colon as seen on the prior CT. Electronically Signed   By: Telford Nab M.D.   On: 08/07/2021 21:46   ECHOCARDIOGRAM COMPLETE  Result Date: 08/11/2021    ECHOCARDIOGRAM REPORT   Patient Name:   Cheryl Jordan Glance Date of Exam: 08/11/2021 Medical Rec #:  [id number]   Height:       67.0 in Accession #:    [id number]  Weight:       186.1 lb Date of Birth:  [birth year]    BSA:          1.961 m Patient Age:    14 years    BP:           96/68 mmHg Patient Gender: F           HR:           88 bpm. Exam Location:  Inpatient Procedure: 2D Echo, Cardiac Doppler and Color Doppler Indications:    murmur  History:        Patient has prior history of Echocardiogram examinations, most                 recent 04/08/2020. Signs/Symptoms:Dyspnea and Murmur;  Risk                 Factors:Diabetes and Hypertension. AS / GERD.  Sonographer:    Beryle Beams Referring Phys: [id number] [place]  Sonographer Comments: No subcostal window. IMPRESSIONS  1. Left ventricular ejection fraction, by estimation, is 65 to 70%. The left ventricle has normal function. The left ventricle has no regional Wax motion abnormalities. Left ventricular  diastolic parameters are consistent with Grade I diastolic dysfunction (impaired relaxation).  2. Right ventricular systolic function is normal. The right ventricular size is normal. Tricuspid regurgitation signal is inadequate for assessing PA pressure.  3. There is a trivial pericardial effusion at the apex.  4. The mitral valve is normal in structure. No evidence of mitral valve regurgitation. No evidence of mitral stenosis.  5. The aortic valve is calcified. Aortic valve regurgitation is not visualized. Mild aortic valve stenosis. Aortic valve area, by VTI measures 1.36 cm. Aortic valve mean gradient measures 17.0 mmHg. Aortic valve Vmax measures 2.80 m/s. FINDINGS  Left Ventricle: Left ventricular ejection fraction, by estimation, is 65 to 70%. The left ventricle has normal function. The left ventricle has no regional Hicks motion abnormalities. The left ventricular internal cavity size was normal in size. There is  no left ventricular hypertrophy. Left ventricular diastolic parameters are consistent with Grade I diastolic dysfunction (impaired relaxation). Normal left ventricular filling pressure. Right Ventricle: The right ventricular size is normal. No increase in right ventricular Disbro thickness. Right ventricular systolic function is normal. Tricuspid regurgitation signal is inadequate for assessing PA pressure. Left Atrium: Left atrial size was normal in size. Right Atrium: Right atrial size was normal in size. Pericardium: Trivial pericardial effusion is present. The pericardial effusion is surrounding the apex. Mitral Valve: The mitral valve is normal in structure. No evidence of mitral valve regurgitation. No evidence of mitral valve stenosis. Tricuspid Valve: The tricuspid valve is normal in structure. Tricuspid valve regurgitation is not demonstrated. No evidence of tricuspid stenosis. Aortic Valve: The aortic valve is calcified. Aortic valve regurgitation is not visualized. Mild aortic stenosis is  present. Aortic valve mean gradient measures 17.0 mmHg. Aortic valve peak gradient measures 31.4 mmHg. Aortic valve area, by VTI measures 1.36 cm. Pulmonic Valve: The pulmonic valve was normal in structure. Pulmonic valve regurgitation is not visualized. No evidence of pulmonic stenosis. Aorta: The aortic root is normal in size and structure. Venous: The inferior vena cava was not well visualized. IAS/Shunts: No atrial level shunt detected by color flow Doppler.  LEFT VENTRICLE PLAX 2D LVIDd:         3.00 cm     Diastology LVIDs:         1.90 cm     LV e' medial:    5.11 cm/s LV PW:         1.00 cm     LV E/e' medial:  14.4 LV IVS:        1.10 cm     LV e' lateral:   7.18 cm/s LVOT diam:     1.50 cm     LV E/e' lateral: 10.3 LV SV:         74 LV SV Index:   38 LVOT Area:     1.77 cm  LV Volumes (MOD) LV vol d, MOD A2C: 52.7 ml LV vol d, MOD A4C: 60.3 ml LV vol s, MOD A2C: 21.8 ml LV vol s, MOD A4C: 23.1 ml LV SV MOD A2C:  30.9 ml LV SV MOD A4C:     60.3 ml LV SV MOD BP:      34.1 ml RIGHT VENTRICLE RV Basal diam:  3.60 cm RV Mid diam:    3.40 cm RV S prime:     17.30 cm/s TAPSE (M-mode): 2.0 cm LEFT ATRIUM             Index        RIGHT ATRIUM           Index LA diam:        2.70 cm 1.38 cm/m   RA Area:     12.60 cm LA Vol (A2C):   26.2 ml 13.36 ml/m  RA Volume:   31.20 ml  15.91 ml/m LA Vol (A4C):   52.2 ml 26.62 ml/m LA Biplane Vol: 37.7 ml 19.22 ml/m  AORTIC VALVE                     PULMONIC VALVE AV Area (Vmax):    1.16 cm      PV Vmax:       1.10 m/s AV Area (Vmean):   1.14 cm      PV Vmean:      67.900 cm/s AV Area (VTI):     1.36 cm      PV VTI:        0.197 m AV Vmax:           280.00 cm/s   PV Peak grad:  4.8 mmHg AV Vmean:          193.000 cm/s  PV Mean grad:  2.0 mmHg AV VTI:            0.543 m AV Peak Grad:      31.4 mmHg AV Mean Grad:      17.0 mmHg LVOT Vmax:         184.00 cm/s LVOT Vmean:        125.000 cm/s LVOT VTI:          0.418 m LVOT/AV VTI ratio: 0.77  AORTA Ao Root diam: 3.00  cm Ao Asc diam:  3.00 cm MITRAL VALVE MV Area (PHT): 2.56 cm     SHUNTS MV Decel Time: 296 msec     Systemic VTI:  0.42 m MV E velocity: 73.60 cm/s   Systemic Diam: 1.50 cm MV A velocity: 124.00 cm/s MV E/A ratio:  0.59 Fransico Him MD Electronically signed by Fransico Him MD Signature Date/Time: 08/11/2021/3:11:23 PM    Final    IR Paracentesis  Result Date: 08/01/2021 INDICATION: Patient with a history of hepatocellular carcinoma, NASH cirrhosis and recurrent large volume ascites. Interventional radiology asked to perform a therapeutic paracentesis up to 8 L. EXAM: ULTRASOUND GUIDED PARACENTESIS MEDICATIONS: 1% lidocaine 10 mL COMPLICATIONS: None immediate. PROCEDURE: Informed written consent was obtained from the patient after a discussion of the risks, benefits and alternatives to treatment. A timeout was performed prior to the initiation of the procedure. Initial ultrasound scanning demonstrates a large amount of ascites within the right lower abdominal quadrant. The right lower abdomen was prepped and draped in the usual sterile fashion. 1% lidocaine was used for local anesthesia. Following this, a 19 gauge, 7-cm, Yueh catheter was introduced. An ultrasound image was saved for documentation purposes. The paracentesis was performed. The catheter was removed and a dressing was applied. The patient tolerated the procedure well without immediate post procedural complication. Patient received post-procedure intravenous albumin; see nursing notes for details.  FINDINGS: A total of approximately 7.2 L of clear yellow fluid was removed. IMPRESSION: Successful ultrasound-guided paracentesis yielding 7.2 liters of peritoneal fluid. Read by: Soyla Dryer, NP Electronically Signed   By: Markus Daft M.D.   On: 08/01/2021 10:57   IR Paracentesis  Result Date: 07/21/2021 INDICATION: History of hepatocellular carcinoma, Nash cirrhosis, and recurrent ascites. Request for diagnostic and therapeutic paracentesis up to 8  L. EXAM: ULTRASOUND GUIDED PARACENTESIS MEDICATIONS: 1% lidocaine 9 mL COMPLICATIONS: None immediate. PROCEDURE: Informed written consent was obtained from the patient after a discussion of the risks, benefits and alternatives to treatment. A timeout was performed prior to the initiation of the procedure. Initial ultrasound scanning demonstrates a large amount of ascites within the left lower abdominal quadrant. The left lower abdomen was prepped and draped in the usual sterile fashion. 1% lidocaine was used for local anesthesia. Following this, a 6 Fr Safe-T-Centesis catheter was introduced. An ultrasound image was saved for documentation purposes. The paracentesis was performed. The catheter was removed and a dressing was applied. The patient tolerated the procedure well without immediate post procedural complication. Patient received post-procedure intravenous albumin; see nursing notes for details. FINDINGS: A total of approximately 7.2 L of cloudy yellow fluid was removed. Samples were sent to the laboratory as requested by the clinical team. IMPRESSION: Successful ultrasound-guided paracentesis yielding 7.2 liters of peritoneal fluid. Read by: Gareth Eagle, PA-C Electronically Signed   By: Jacqulynn Cadet M.D.   On: 07/21/2021 15:35    Microbiology: Results for orders placed or performed during the hospital encounter of 08/07/21  Resp Panel by RT-PCR (Flu A&B, Covid) Nasopharyngeal Swab     Status: None   Collection Time: 08/07/21  7:25 PM   Specimen: Nasopharyngeal Swab; Nasopharyngeal(NP) swabs in vial transport medium  Result Value Ref Range Status   SARS Coronavirus 2 by RT PCR NEGATIVE NEGATIVE Final    Comment: (NOTE) SARS-CoV-2 target nucleic acids are NOT DETECTED.  The SARS-CoV-2 RNA is generally detectable in upper respiratory specimens during the acute phase of infection. The lowest concentration of SARS-CoV-2 viral copies this assay can detect is 138 copies/mL. A negative result  does not preclude SARS-Cov-2 infection and should not be used as the sole basis for treatment or other patient management decisions. A negative result may occur with  improper specimen collection/handling, submission of specimen other than nasopharyngeal swab, presence of viral mutation(s) within the areas targeted by this assay, and inadequate number of viral copies(<138 copies/mL). A negative result must be combined with clinical observations, patient history, and epidemiological information. The expected result is Negative.  Fact Sheet for Patients:  EntrepreneurPulse.com.au  Fact Sheet for Healthcare Providers:  IncredibleEmployment.be  This test is no t yet approved or cleared by the [place] FDA and  has been authorized for detection and/or diagnosis of SARS-CoV-2 by FDA under an Emergency Use Authorization (EUA). This EUA will remain  in effect (meaning this test can be used) for the duration of the COVID-19 declaration under Section 564(b)(1) of the Act, 21 U.S.C.section 360bbb-3(b)(1), unless the authorization is terminated  or revoked sooner.       Influenza A by PCR NEGATIVE NEGATIVE Final   Influenza B by PCR NEGATIVE NEGATIVE Final    Comment: (NOTE) The Xpert Xpress SARS-CoV-2/FLU/RSV plus assay is intended as an aid in the diagnosis of influenza from Nasopharyngeal swab specimens and should not be used as a sole basis for treatment. Nasal washings and aspirates are unacceptable for Xpert Xpress SARS-CoV-2/FLU/RSV testing.  Fact Sheet for Patients: EntrepreneurPulse.com.au  Fact Sheet for Healthcare Providers: IncredibleEmployment.be  This test is not yet approved or cleared by the [place] FDA and has been authorized for detection and/or diagnosis of SARS-CoV-2 by FDA under an Emergency Use Authorization (EUA). This EUA will remain in effect (meaning this test can be used) for  the duration of the COVID-19 declaration under Section 564(b)(1) of the Act, 21 U.S.C. section 360bbb-3(b)(1), unless the authorization is terminated or revoked.  Performed at [facility], [place] [address] [place], [place] [address]   Gastrointestinal Panel by PCR , Stool     Status: None   Collection Time: 08/09/21 12:55 AM   Specimen: STOOL  Result Value Ref Range Status   Campylobacter species NOT DETECTED NOT DETECTED Final   Plesimonas shigelloides NOT DETECTED NOT DETECTED Final   Salmonella species NOT DETECTED NOT DETECTED Final   Yersinia enterocolitica NOT DETECTED NOT DETECTED Final   Vibrio species NOT DETECTED NOT DETECTED Final   Vibrio cholerae NOT DETECTED NOT DETECTED Final   Enteroaggregative E coli (EAEC) NOT DETECTED NOT DETECTED Final   Enteropathogenic E coli (EPEC) NOT DETECTED NOT DETECTED Final   Enterotoxigenic E coli (ETEC) NOT DETECTED NOT DETECTED Final   Shiga like toxin producing E coli (STEC) NOT DETECTED NOT DETECTED Final   Shigella/Enteroinvasive E coli (EIEC) NOT DETECTED NOT DETECTED Final   Cryptosporidium NOT DETECTED NOT DETECTED Final   Cyclospora cayetanensis NOT DETECTED NOT DETECTED Final   Entamoeba histolytica NOT DETECTED NOT DETECTED Final   Giardia lamblia NOT DETECTED NOT DETECTED Final   Adenovirus F40/41 NOT DETECTED NOT DETECTED Final   Astrovirus NOT DETECTED NOT DETECTED Final   Norovirus GI/GII NOT DETECTED NOT DETECTED Final   Rotavirus A NOT DETECTED NOT DETECTED Final   Sapovirus (I, II, IV, and V) NOT DETECTED NOT DETECTED Final    Comment: Performed at [facility], [address] [place], [place] [address]  C Difficile Quick Screen w PCR reflex     Status: None   Collection Time: 08/09/21 12:55 AM   Specimen: STOOL  Result Value Ref Range Status   C Diff antigen NEGATIVE NEGATIVE Final   C Diff toxin NEGATIVE NEGATIVE Final   C Diff interpretation No C. difficile detected.  Final     Comment: Performed at [facility], [place] [address], [place],  [address]    Labs: CBC: Recent Labs  Lab 08/07/21 2300 08/08/21 0221 08/09/21 0512 08/09/21 1848 08/10/21 0457 08/11/21 0523  WBC 5.0 5.0 3.4*  --  4.1 4.9  NEUTROABS 3.2 3.3 2.0  --  2.6 3.1  HGB 8.3* 8.5* 6.7* 8.7* 8.6* 8.1*  HCT 27.3* 27.6* 21.8* 27.4* 27.0* 26.6*  MCV 95.1 95.5 97.3  --  93.1 94.3  PLT 164 167 122*  --  124* 591*   Basic Metabolic Panel: Recent Labs  Lab 08/07/21 1510 08/08/21 0221 08/09/21 0512 08/10/21 0457 08/11/21 0523  NA 136 137 138 136 136  K 4.6 4.2 4.0 4.1 4.5  CL 108 107 111 110 110  CO2 21* 20* 19* 19* 20*  GLUCOSE 76 104* 86 92 94  BUN 52* 51* 47* 43* 42*  CREATININE 4.19* 4.09* 3.37* 3.20* 3.15*  CALCIUM 8.4* 8.3* 8.3* 8.3* 8.2*  MG  --  2.4   2.5* 2.3 2.2 2.3  PHOS  --  5.0*   4.9*  --   --   --    Liver Function Tests:  Recent Labs  Lab 08/07/21 1510 08/08/21 0221 08/09/21 0512 08/10/21 0457 08/11/21 0523  AST 17 15   17 15 15  13*  ALT 9 10   9 9 9 8   ALKPHOS 99 86   88 66 72 65  BILITOT 1.2 1.4*   1.4* 1.2 2.4* 1.3*  PROT 5.8* 5.5*   5.6* 4.9* 4.8* 4.6*  ALBUMIN 2.9* 2.8*   2.8* 2.8* 2.7* 2.6*   CBG: Recent Labs  Lab 08/10/21 2342 08/11/21 0356 08/11/21 0759 08/11/21 1210 08/11/21 1639  GLUCAP 93 90 104* 101* 143*    Discharge time spent: greater than 30 minutes.  Signed: Berle Mull, MD [facility]

## 2021-08-14 NOTE — Telephone Encounter (Signed)
Called and left message a [facility] again regarding pt needing an urgent appt.

## 2021-08-14 NOTE — Telephone Encounter (Signed)
-----  Message from John N Perry, MD sent at 08/12/2021  2:52 PM EST ----- °Thanks Dan. °[name], work on urgent nephrology appointment to answer the specific question previously posed. °Thanks, °JP ° °----- Message ----- °From: Jacobs, Daniel P, MD °Sent: 08/11/2021   3:26 PM EST °To: John N Perry, MD, [name] R Hunt, RN ° °More follow up on her. ° °She's really back to her baseline, had 3 liter paracentesis today. Underwent IR recommended TIPS workup with MRI (looks good, at least good for her) and Echo (looks fine to my review, shouldn't hold her back from TIPS eligibility).   ° °She is interested in going home and continuing to move forward with elective TIPS workup.   ° °She'll need to see her nephrologist (probably will need help with that ROV), IR (they should be scheduling it, they met her in hosp already), and Dr. Perry. ° °Periodic LVPs as her usual for now. ° °Thanks ° °DJ ° °----- Message ----- °From: Jacobs, Daniel P, MD °Sent: 08/11/2021   1:31 PM EST °To: John N Perry, MD, [name] R Hunt, RN ° °I agree with your thoughts and I may actually have nephrology see her in hospital. IR has already blessed the TIPS, Dylan Suttle thinks theres a good chance it will help ascites and CRI.   ° °Thanks for weighing in. ° ° °----- Message ----- °From: Perry, John N, MD °Sent: 08/11/2021  12:44 PM EST °To: Daniel P Jacobs, MD, [name] R Hunt, RN ° °Thanks Dan! ° °As you may know, I referred her to nephrology previously. Not a lot of help. °Her HCC seems under control post ablation, for now. °I have concerns about potential complete renal failure post TIPS in this 80 yo patient with Cr. > 3. A little out of my comfort zone. You? ° °[name], °1. Get her follow up with her nephrologist "is she a TIPS candidate?" °2. Make sure she has IR outpatient appointment "is she a TIPS candidate?" °3. See me after the above completed ° °Thanks, ° °JP °----- Message ----- °From: Jacobs, Daniel P, MD °Sent: 08/11/2021   7:59 AM EST °To: John N  Perry, MD ° °John, ° °She's in hospital with acute on chronic anemia.  Probably d/c soon.  I mentioned TIPS to her to help with her referactory ascites and possibly also her CRI.  She's certainly interested in learning more about it.  IR saw her and think she is probably a good candidate but needed some further testing.  Certainly not an emergent or even urgent situation and she'd like to take it slow.  Will probably have her sit down with her nephrologist as an outpatient, ROV with you as an outpatient and also IR will see her in their outpatient clinic so that everyone could be on the same page if she agrees to proceed.   ° °Any chance you could see her in the next few weeks to keep things moving if you and she are both agreeable (vs. Continuing LVPs) ° °Thanks °DJ ° ° ° ° ° °

## 2021-08-14 NOTE — Telephone Encounter (Signed)
Left message at [facility] regarding an urgent appt for pt.

## 2021-08-14 NOTE — Telephone Encounter (Signed)
Doctor's office is returning your call.

## 2021-08-15 ENCOUNTER — Ambulatory Visit: Payer: Medicare Other | Admitting: Internal Medicine

## 2021-08-15 NOTE — Telephone Encounter (Signed)
Left message for Cheryl Jordan, Dr. Luis Abed CMA, regarding correspondence per Dr. Henrene Pastor.  After further discussion cma for Dr. Royce Macadamia is going to let her know that IR has already stated pt is a candidate but Dr. Henrene Pastor is concerned about her kidney function in regards to having the Tips. Will await further communication from Dr. Luis Abed office.

## 2021-08-16 ENCOUNTER — Encounter: Payer: Self-pay | Admitting: *Deleted

## 2021-08-16 NOTE — Progress Notes (Signed)
Per Dr. Benay Spice: F/U in 3-4 weeks with CBC and iron studies. Scheduling message sent.

## 2021-08-17 ENCOUNTER — Telehealth: Payer: Self-pay | Admitting: Oncology

## 2021-08-17 ENCOUNTER — Other Ambulatory Visit: Payer: Self-pay

## 2021-08-17 DIAGNOSIS — R188 Other ascites: Secondary | ICD-10-CM

## 2021-08-17 DIAGNOSIS — K7460 Unspecified cirrhosis of liver: Secondary | ICD-10-CM

## 2021-08-17 NOTE — Telephone Encounter (Signed)
Noted  

## 2021-08-17 NOTE — Telephone Encounter (Signed)
Letter placed in Dr. Blanch Media inbox.

## 2021-08-17 NOTE — Telephone Encounter (Signed)
Thank you.  I have received correspondence from Dr. Royce Macadamia (nephrology).  In short, she supports TIPS and feels this may benefit the patient's kidney function.  I will share this with the patient.  Docia Chuck. Geri Seminole., M.D. [facility] Division of Gastroenterology

## 2021-08-17 NOTE — Telephone Encounter (Signed)
Spoke with Arville Go, Dr. Luis Abed CMA, who states she faxed a letter to [phone] a few days ago.

## 2021-08-17 NOTE — Telephone Encounter (Signed)
Called patient 2x - no answer and no vmail. Called son and left message for son  to call back to schedule appt.

## 2021-08-18 ENCOUNTER — Inpatient Hospital Stay (HOSPITAL_COMMUNITY): Payer: Medicare Other

## 2021-08-18 ENCOUNTER — Emergency Department (HOSPITAL_COMMUNITY): Payer: Medicare Other

## 2021-08-18 ENCOUNTER — Encounter (HOSPITAL_COMMUNITY): Payer: Self-pay | Admitting: *Deleted

## 2021-08-18 ENCOUNTER — Other Ambulatory Visit: Payer: Self-pay

## 2021-08-18 ENCOUNTER — Inpatient Hospital Stay (HOSPITAL_COMMUNITY)
Admission: EM | Admit: 2021-08-18 | Discharge: 2021-08-24 | DRG: 640 | Disposition: A | Discharge status: Home Health | Payer: Medicare Other | Attending: Internal Medicine | Admitting: Internal Medicine

## 2021-08-18 DIAGNOSIS — U071 COVID-19: Secondary | ICD-10-CM | POA: Diagnosis present

## 2021-08-18 DIAGNOSIS — I129 Hypertensive chronic kidney disease with stage 1 through stage 4 chronic kidney disease, or unspecified chronic kidney disease: Secondary | ICD-10-CM | POA: Diagnosis present

## 2021-08-18 DIAGNOSIS — Z66 Do not resuscitate: Secondary | ICD-10-CM | POA: Diagnosis not present

## 2021-08-18 DIAGNOSIS — Z86718 Personal history of other venous thrombosis and embolism: Secondary | ICD-10-CM

## 2021-08-18 DIAGNOSIS — R059 Cough, unspecified: Secondary | ICD-10-CM | POA: Diagnosis present

## 2021-08-18 DIAGNOSIS — R627 Adult failure to thrive: Secondary | ICD-10-CM | POA: Diagnosis present

## 2021-08-18 DIAGNOSIS — Z515 Encounter for palliative care: Secondary | ICD-10-CM | POA: Diagnosis not present

## 2021-08-18 DIAGNOSIS — K766 Portal hypertension: Secondary | ICD-10-CM | POA: Diagnosis present

## 2021-08-18 DIAGNOSIS — K7469 Other cirrhosis of liver: Secondary | ICD-10-CM | POA: Diagnosis not present

## 2021-08-18 DIAGNOSIS — K7460 Unspecified cirrhosis of liver: Secondary | ICD-10-CM

## 2021-08-18 DIAGNOSIS — E8729 Other acidosis: Secondary | ICD-10-CM | POA: Diagnosis present

## 2021-08-18 DIAGNOSIS — E039 Hypothyroidism, unspecified: Secondary | ICD-10-CM | POA: Diagnosis present

## 2021-08-18 DIAGNOSIS — Z7189 Other specified counseling: Secondary | ICD-10-CM | POA: Diagnosis not present

## 2021-08-18 DIAGNOSIS — Z885 Allergy status to narcotic agent status: Secondary | ICD-10-CM | POA: Diagnosis not present

## 2021-08-18 DIAGNOSIS — E878 Other disorders of electrolyte and fluid balance, not elsewhere classified: Secondary | ICD-10-CM | POA: Diagnosis present

## 2021-08-18 DIAGNOSIS — F419 Anxiety disorder, unspecified: Secondary | ICD-10-CM | POA: Diagnosis present

## 2021-08-18 DIAGNOSIS — K7210 Chronic hepatic failure without coma: Secondary | ICD-10-CM | POA: Diagnosis present

## 2021-08-18 DIAGNOSIS — N170 Acute kidney failure with tubular necrosis: Secondary | ICD-10-CM | POA: Diagnosis not present

## 2021-08-18 DIAGNOSIS — E1169 Type 2 diabetes mellitus with other specified complication: Secondary | ICD-10-CM | POA: Diagnosis present

## 2021-08-18 DIAGNOSIS — K219 Gastro-esophageal reflux disease without esophagitis: Secondary | ICD-10-CM | POA: Diagnosis present

## 2021-08-18 DIAGNOSIS — N179 Acute kidney failure, unspecified: Secondary | ICD-10-CM | POA: Diagnosis present

## 2021-08-18 DIAGNOSIS — Z6829 Body mass index (BMI) 29.0-29.9, adult: Secondary | ICD-10-CM

## 2021-08-18 DIAGNOSIS — Z85038 Personal history of other malignant neoplasm of large intestine: Secondary | ICD-10-CM

## 2021-08-18 DIAGNOSIS — D500 Iron deficiency anemia secondary to blood loss (chronic): Secondary | ICD-10-CM | POA: Diagnosis present

## 2021-08-18 DIAGNOSIS — R051 Acute cough: Secondary | ICD-10-CM | POA: Diagnosis not present

## 2021-08-18 DIAGNOSIS — E785 Hyperlipidemia, unspecified: Secondary | ICD-10-CM | POA: Diagnosis present

## 2021-08-18 DIAGNOSIS — R531 Weakness: Secondary | ICD-10-CM | POA: Diagnosis not present

## 2021-08-18 DIAGNOSIS — N189 Chronic kidney disease, unspecified: Secondary | ICD-10-CM | POA: Diagnosis not present

## 2021-08-18 DIAGNOSIS — C189 Malignant neoplasm of colon, unspecified: Secondary | ICD-10-CM | POA: Diagnosis present

## 2021-08-18 DIAGNOSIS — Z825 Family history of asthma and other chronic lower respiratory diseases: Secondary | ICD-10-CM

## 2021-08-18 DIAGNOSIS — E1122 Type 2 diabetes mellitus with diabetic chronic kidney disease: Secondary | ICD-10-CM | POA: Diagnosis present

## 2021-08-18 DIAGNOSIS — N1832 Chronic kidney disease, stage 3b: Secondary | ICD-10-CM | POA: Diagnosis present

## 2021-08-18 DIAGNOSIS — D6959 Other secondary thrombocytopenia: Secondary | ICD-10-CM | POA: Diagnosis present

## 2021-08-18 DIAGNOSIS — K7581 Nonalcoholic steatohepatitis (NASH): Secondary | ICD-10-CM | POA: Diagnosis present

## 2021-08-18 DIAGNOSIS — R180 Malignant ascites: Secondary | ICD-10-CM | POA: Diagnosis not present

## 2021-08-18 DIAGNOSIS — R188 Other ascites: Secondary | ICD-10-CM | POA: Diagnosis present

## 2021-08-18 DIAGNOSIS — K7290 Hepatic failure, unspecified without coma: Secondary | ICD-10-CM | POA: Diagnosis not present

## 2021-08-18 DIAGNOSIS — E43 Unspecified severe protein-calorie malnutrition: Secondary | ICD-10-CM | POA: Diagnosis present

## 2021-08-18 DIAGNOSIS — Z905 Acquired absence of kidney: Secondary | ICD-10-CM

## 2021-08-18 DIAGNOSIS — Z833 Family history of diabetes mellitus: Secondary | ICD-10-CM

## 2021-08-18 DIAGNOSIS — Z8249 Family history of ischemic heart disease and other diseases of the circulatory system: Secondary | ICD-10-CM

## 2021-08-18 DIAGNOSIS — Z801 Family history of malignant neoplasm of trachea, bronchus and lung: Secondary | ICD-10-CM

## 2021-08-18 LAB — CBC WITH DIFFERENTIAL/PLATELET
Abs Immature Granulocytes: 0.01 10*3/uL (ref 0.00–0.07)
Basophils Absolute: 0 10*3/uL (ref 0.0–0.1)
Basophils Relative: 0 %
Eosinophils Absolute: 0 10*3/uL (ref 0.0–0.5)
Eosinophils Relative: 1 %
HCT: 32.1 % — ABNORMAL LOW (ref 36.0–46.0)
Hemoglobin: 10.2 g/dL — ABNORMAL LOW (ref 12.0–15.0)
Immature Granulocytes: 0 %
Lymphocytes Relative: 19 %
Lymphs Abs: 0.9 10*3/uL (ref 0.7–4.0)
MCH: 29 pg (ref 26.0–34.0)
MCHC: 31.8 g/dL (ref 30.0–36.0)
MCV: 91.2 fL (ref 80.0–100.0)
Monocytes Absolute: 0.3 10*3/uL (ref 0.1–1.0)
Monocytes Relative: 6 %
Neutro Abs: 3.5 10*3/uL (ref 1.7–7.7)
Neutrophils Relative %: 74 %
Platelets: 111 10*3/uL — ABNORMAL LOW (ref 150–400)
RBC: 3.52 MIL/uL — ABNORMAL LOW (ref 3.87–5.11)
RDW: 15.7 % — ABNORMAL HIGH (ref 11.5–15.5)
WBC: 4.8 10*3/uL (ref 4.0–10.5)
nRBC: 0 % (ref 0.0–0.2)

## 2021-08-18 LAB — COMPREHENSIVE METABOLIC PANEL
ALT: 15 U/L (ref 0–44)
AST: 33 U/L (ref 15–41)
Albumin: 2.7 g/dL — ABNORMAL LOW (ref 3.5–5.0)
Alkaline Phosphatase: 82 U/L (ref 38–126)
Anion gap: 9 (ref 5–15)
BUN: 58 mg/dL — ABNORMAL HIGH (ref 8–23)
CO2: 18 mmol/L — ABNORMAL LOW (ref 22–32)
Calcium: 8.4 mg/dL — ABNORMAL LOW (ref 8.9–10.3)
Chloride: 108 mmol/L (ref 98–111)
Creatinine, Ser: 3.36 mg/dL — ABNORMAL HIGH (ref 0.44–1.00)
GFR, Estimated: 13 mL/min — ABNORMAL LOW (ref >60)
Glucose, Bld: 105 mg/dL — ABNORMAL HIGH (ref 70–99)
Potassium: 4.7 mmol/L (ref 3.5–5.1)
Sodium: 135 mmol/L (ref 135–145)
Total Bilirubin: 0.9 mg/dL (ref 0.3–1.2)
Total Protein: 5.5 g/dL — ABNORMAL LOW (ref 6.5–8.1)

## 2021-08-18 LAB — URINALYSIS, ROUTINE W REFLEX MICROSCOPIC
Bilirubin Urine: NEGATIVE
Glucose, UA: NEGATIVE mg/dL
Hgb urine dipstick: NEGATIVE
Ketones, ur: NEGATIVE mg/dL
Leukocytes,Ua: NEGATIVE
Nitrite: NEGATIVE
Protein, ur: NEGATIVE mg/dL
Specific Gravity, Urine: 1.01 (ref 1.005–1.030)
pH: 6 (ref 5.0–8.0)

## 2021-08-18 LAB — RESP PANEL BY RT-PCR (FLU A&B, COVID) ARPGX2
Influenza A by PCR: NEGATIVE
Influenza B by PCR: NEGATIVE
SARS Coronavirus 2 by RT PCR: POSITIVE — AB

## 2021-08-18 LAB — LIPASE, BLOOD: Lipase: 43 U/L (ref 11–51)

## 2021-08-18 MED ORDER — ONDANSETRON HCL 4 MG/2ML IJ SOLN
4.0000 mg | Freq: Four times a day (QID) | INTRAMUSCULAR | Status: DC | PRN
  Administered 2021-08-19 – 2021-08-24 (×6): 4 mg via INTRAVENOUS
  Filled 2021-08-18 (×6): qty 2

## 2021-08-18 MED ORDER — LORAZEPAM 0.5 MG PO TABS
0.5000 mg | ORAL_TABLET | Freq: Two times a day (BID) | ORAL | Status: DC | PRN
  Administered 2021-08-19 – 2021-08-22 (×4): 0.5 mg via ORAL
  Filled 2021-08-18 (×5): qty 1

## 2021-08-18 MED ORDER — ONDANSETRON HCL 4 MG PO TABS
4.0000 mg | ORAL_TABLET | Freq: Four times a day (QID) | ORAL | Status: DC | PRN
  Administered 2021-08-23 (×2): 4 mg via ORAL
  Filled 2021-08-18 (×2): qty 1

## 2021-08-18 MED ORDER — BENZONATATE 100 MG PO CAPS: 100.0000 mg | ORAL_CAPSULE | Freq: Three times a day (TID) | ORAL | Status: DC | PRN

## 2021-08-18 MED ORDER — INSULIN ASPART 100 UNIT/ML IJ SOLN
0.0000 [IU] | Freq: Three times a day (TID) | INTRAMUSCULAR | Status: DC
  Administered 2021-08-20: 1 [IU] via SUBCUTANEOUS
  Administered 2021-08-21: 2 [IU] via SUBCUTANEOUS
  Administered 2021-08-22: 1 [IU] via SUBCUTANEOUS
  Administered 2021-08-22: 2 [IU] via SUBCUTANEOUS
  Administered 2021-08-23 – 2021-08-24 (×2): 1 [IU] via SUBCUTANEOUS
  Filled 2021-08-18: qty 0.09

## 2021-08-18 MED ORDER — DEXTROSE-NACL 5-0.9 % IV SOLN: INTRAVENOUS | Status: DC

## 2021-08-18 MED ORDER — ACETAMINOPHEN 500 MG PO TABS
500.0000 mg | ORAL_TABLET | Freq: Every day | ORAL | Status: DC | PRN
  Filled 2021-08-18: qty 1

## 2021-08-18 MED ORDER — SODIUM CHLORIDE 0.9 % IV BOLUS
500.0000 mL | Freq: Once | INTRAVENOUS | Status: AC
  Administered 2021-08-18: 500 mL via INTRAVENOUS

## 2021-08-18 MED ORDER — LEVOTHYROXINE SODIUM 88 MCG PO TABS
88.0000 ug | ORAL_TABLET | Freq: Every day | ORAL | Status: DC
  Administered 2021-08-19 – 2021-08-24 (×6): 88 ug via ORAL
  Filled 2021-08-18 (×6): qty 1

## 2021-08-18 MED ORDER — BOOST PLUS PO LIQD: 237.0000 mL | Freq: Three times a day (TID) | ORAL | Status: DC

## 2021-08-18 MED ORDER — ONDANSETRON HCL 4 MG/2ML IJ SOLN
4.0000 mg | Freq: Once | INTRAMUSCULAR | Status: AC
  Administered 2021-08-18: 4 mg via INTRAVENOUS
  Filled 2021-08-18: qty 2

## 2021-08-18 NOTE — ED Triage Notes (Signed)
BIB EMS with the call stating she was to be a direct admit due to weakness and loss appetites. Seen on 08/07/21 with N/V/D.

## 2021-08-18 NOTE — Telephone Encounter (Signed)
Spoke with radiology regarding referral that was placed for tips evaluation. Radiology states that pt has been called and left messages to call back x 2 and she has not returned their call. Called pt and asked to speak with her and the husband stated that she was to sick to talk. Reports she can't eat and what she does eat she is not keeping down. He stated that they were going to wait until Monday and go back to the hospital. Discussed with husband that if she is not able to keep anything down he needs to take her back to the hospital now. Husband stated ok but not sure if he will take the advice or not. Dr. Henrene Pastor notified.

## 2021-08-18 NOTE — ED Provider Notes (Signed)
[facility] DEPT Provider Note   CSN: [id number] Arrival date & time: 08/18/21  1307     History  Chief Complaint  Patient presents with   Weakness    Cheryl Jordan is a 81 y.o. female.  Patient is a 81 year old female who presents with generalized weakness.  She has a history of adenocarcinoma the colon, cirrhosis, diabetes, GERD, hypertension.  She has to get regular paracentesis for ascites.  She was recently admitted last week for nausea vomiting diarrhea associate with acute renal failure.  Per chart review, she had a CT scan which showed no acute abnormality.  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  She had C. difficile and gastric stool studies that were negative.  She says that since discharge she has had ongoing nausea and loose stools.  She also has had a cough.  No shortness of breath.  She occasionally have some left side abdominal pain but denies any currently.  No known fevers.  She has had some decreased urination but no burning on urination.  Overall she has had some generalized weakness and is currently limited in her ability to ambulate due to weakness.       Home Medications Prior to Admission medications   Medication Sig Start Date End Date Taking? Authorizing Provider  ACCU-CHEK AVIVA PLUS test strip  10/20/18   [provider]  acetaminophen (TYLENOL) 500 MG tablet Take 500 mg by mouth daily as needed for moderate pain.    [provider]  benzonatate (TESSALON PERLES) 100 MG capsule Take 1 capsule (100 mg total) by mouth 3 (three) times daily as needed for cough. 08/11/21 08/11/22  Lavina Hamman, MD  glipiZIDE (GLUCOTROL XL) 5 MG 24 hr tablet Take 5 mg by mouth daily with breakfast.    [provider]  guaiFENesin-dextromethorphan (ROBITUSSIN DM) 100-10 MG/5ML syrup Take 5 mLs by mouth every 4 (four) hours as needed for cough. 08/11/21    Lavina Hamman, MD  lactose free nutrition (BOOST PLUS) LIQD Take 237 mLs by mouth 3 (three) times daily with meals. 08/11/21   Lavina Hamman, MD  levothyroxine (SYNTHROID) 88 MCG tablet Take 1 tablet (88 mcg total) by mouth daily. 12/04/19   Brunetta Jeans, PA-C  loperamide (IMODIUM) 2 MG capsule Take 2 mg by mouth as needed for diarrhea or loose stools.    [provider]  LORazepam (ATIVAN) 0.5 MG tablet Take 0.5 mg by mouth 2 (two) times daily as needed for anxiety.    [provider]  ondansetron (ZOFRAN) 4 MG tablet Take 1 tablet (4 mg total) by mouth daily as needed for nausea or vomiting. 08/11/21 08/11/22  Lavina Hamman, MD  pantoprazole (PROTONIX) 40 MG tablet Take 1 tablet (40 mg total) by mouth 2 (two) times daily before a meal. 08/11/21   Lavina Hamman, MD  Simethicone (GAS-X PO) Take 1 tablet by mouth as needed (flatulence).    [provider]  sucralfate (CARAFATE) 1 g tablet Take 1 tablet (1 g total) by mouth 2 (two) times daily. 08/11/21   Lavina Hamman, MD  Vitamin D, Ergocalciferol, (DRISDOL) 1.25 MG (50000 UNIT) CAPS capsule Take 50,000 Units by mouth once a week. Saturday 06/01/21   [provider]  rivaroxaban (XARELTO) 20 MG TABS tablet Take 1 tablet (20 mg total) by mouth daily with supper. Patient not taking: Reported on 08/04/2020  07/08/20 08/10/20  Eugenie Filler, MD      Allergies    Aspirin and Codeine    Review of Systems   Review of Systems  Constitutional:  Positive for fatigue. Negative for chills, diaphoresis and fever.  HENT:  Negative for congestion, rhinorrhea and sneezing.   Eyes: Negative.   Respiratory:  Negative for cough, chest tightness and shortness of breath.   Cardiovascular:  Negative for chest pain and leg swelling.  Gastrointestinal:  Positive for abdominal pain, diarrhea and nausea. Negative for blood in stool and vomiting.  Genitourinary:  Negative for difficulty urinating, flank pain, frequency  and hematuria.  Musculoskeletal:  Negative for arthralgias and back pain.  Skin:  Negative for rash.  Neurological:  Positive for weakness (Generalized). Negative for dizziness, speech difficulty, numbness and headaches.   Physical Exam Updated Vital Signs BP 120/61    Pulse 83    Temp 97.9 F (36.6 C) (Oral)    Resp 19    Wt 84.4 kg    SpO2 96%    BMI 29.14 kg/m  Physical Exam Constitutional:      Appearance: She is well-developed. She is ill-appearing.  HENT:     Head: Normocephalic and atraumatic.  Eyes:     Pupils: Pupils are equal, round, and reactive to light.  Cardiovascular:     Rate and Rhythm: Normal rate and regular rhythm.     Heart sounds: Normal heart sounds.  Pulmonary:     Effort: Pulmonary effort is normal. No respiratory distress.     Breath sounds: Normal breath sounds. No wheezing or rales.  Chest:     Chest Agyeman: No tenderness.  Abdominal:     General: Bowel sounds are normal.     Palpations: Abdomen is soft.     Tenderness: There is no abdominal tenderness. There is no guarding or rebound.  Musculoskeletal:        General: Normal range of motion.     Cervical back: Normal range of motion and neck supple.  Lymphadenopathy:     Cervical: No cervical adenopathy.  Skin:    General: Skin is warm and dry.     Findings: No rash.  Neurological:     Mental Status: She is alert and oriented to person, place, and time.    ED Results / Procedures / Treatments   Labs (all labs ordered are listed, but only abnormal results are displayed) Labs Reviewed  COMPREHENSIVE METABOLIC PANEL - Abnormal; Notable for the following components:      Result Value   CO2 18 (*)    Glucose, Bld 105 (*)    BUN 58 (*)    Creatinine, Ser 3.36 (*)    Calcium 8.4 (*)    Total Protein 5.5 (*)    Albumin 2.7 (*)    GFR, Estimated 13 (*)    All other components within normal limits  CBC WITH DIFFERENTIAL/PLATELET - Abnormal; Notable for the following components:   RBC 3.52 (*)     Hemoglobin 10.2 (*)    HCT 32.1 (*)    RDW 15.7 (*)    Platelets 111 (*)    All other components within normal limits  RESP PANEL BY RT-PCR (FLU A&B, COVID) ARPGX2  LIPASE, BLOOD  URINALYSIS, ROUTINE W REFLEX MICROSCOPIC    EKG EKG Interpretation  Date/Time:  Friday August 18 2021 14:39:57 EST Ventricular Rate:  79 PR Interval:  156 QRS Duration: 100 QT Interval:  406 QTC Calculation: 466 R Axis:  36 Text Interpretation: Sinus rhythm Low voltage, extremity leads since last tracing no significant change Confirmed by Malvin Johns [phone]) on 08/18/2021 3:01:43 PM  Radiology No results found.  Procedures Procedures    Medications Ordered in ED Medications  sodium chloride 0.9 % bolus 500 mL (500 mLs Intravenous Bolus 08/18/21 1531)  ondansetron (ZOFRAN) injection 4 mg (4 mg Intravenous Given 08/18/21 1531)    ED Course/ Medical Decision Making/ A&P                           Medical Decision Making Amount and/or Complexity of Data Reviewed Labs: ordered.  Risk Prescription drug management.   Patient is a 81 year old female who presents with nausea and loose stools.  No fevers.  No current abdominal pain.  She was recently admitted for similar symptoms.  She has generalized weakness and is unable to ambulate unassisted.  Her labs were drawn today and show an elevated creatinine.  It is slightly more elevated than her prior values.  Her hemoglobin is low but actually a little bit higher than prior values.  This may be resulting from hemoconcentration.  Her urine is pending.  I reviewed her records and she has had pretty extensive evaluation on her last admission.  Do not feel that a repeat CT scan is indicated at this point.  She was started on IV fluids and antiemetics.  I spoke with Dr. Darrick Meigs, the hospitalist to admit the patient for further treatment.  Her urine, COVID test and chest x-ray are still pending.  Final Clinical Impression(s) / ED Diagnoses Final diagnoses:   Weakness    Rx / DC Orders ED Discharge Orders     None         Malvin Johns, MD 08/18/21 [phone]

## 2021-08-18 NOTE — Telephone Encounter (Signed)
Yes.  Agree with recommendation to go to the hospital if she is failing to thrive.

## 2021-08-18 NOTE — H&P (Signed)
TRH H&P    Patient Demographics:    Cheryl Jordan, is a 81 y.o. female  MRN: [id number]  DOB - 1940-07-28  Admit Date - 08/18/2021  Referring MD/NP/PA: Dr. Tamera Punt  Outpatient Primary MD for the patient is Wannetta Sender, FNP  Patient coming from: Home  Chief complaint-nausea, diarrhea, poor p.o. intake   HPI:    Cheryl Jordan  is a 80 y.o. female, with medical history of diabetes mellitus type 2, liver cirrhosis, adenocarcinoma of the colon, recurrent ascites, portal hypertensive gastropathy, esophageal varices, hypothyroidism, hypertension who was just discharged from the hospital on 08/11/2021 after she was admitted for acute kidney injury on CKD.  There was discussion regarding performing TIPS procedure as outpatient. As per patient's son, patient had very poor p.o. intake after discharge.  Also developed cough, had nausea but no vomiting.  She also has been having 3-4 loose bowel movements.  She denies chest pain or shortness of breath.  Denies fever or chills.  No dysuria.  Denies abdominal pain.  She usually gets paracentesis every 8 to 10 days, last paracentesis was performed on 08/11/2021, at that time 3 L of peritoneal fluid was removed. Today she called gastroenterologist office regarding persistent nausea with poor p.o. intake and she was recommended to come to the ED for admission for failure to thrive.    Review of systems:    In addition to the HPI above,   All other systems reviewed and are negative.    Past History of the following :    Past Medical History:  Diagnosis Date   Allergy    Anemia    hx of   Anxiety    Aortic stenosis    mild by 04/08/20 echo   Arthritis    Colon cancer ([place]) dx'd 11/2014   Diabetes mellitus without complication (HCC)    TYPE II   Dyspnea    Fatty liver    Gallbladder disease    GERD (gastroesophageal reflux disease)    Heart murmur    History of gout     Hypertension    Hypothyroidism    Kidney disorder    spot on left kidney   Osteoporosis    PONV (postoperative nausea and vomiting)       Past Surgical History:  Procedure Laterality Date   BIOPSY  06/17/2020   Procedure: BIOPSY;  Surgeon: Irving Copas., MD;  Location: [facility];  Service: Gastroenterology;;   CATARACT EXTRACTION Left    CHOLECYSTECTOMY     COLONOSCOPY WITH PROPOFOL N/A 06/17/2020   Procedure: COLONOSCOPY WITH PROPOFOL;  Surgeon: Irving Copas., MD;  Location: [facility];  Service: Gastroenterology;  Laterality: N/A;   COLONOSCOPY WITH PROPOFOL N/A 07/01/2020   Procedure: COLONOSCOPY WITH PROPOFOL;  Surgeon: Mauri Pole, MD;  Location: [facility];  Service: Endoscopy;  Laterality: N/A;   ESOPHAGOGASTRODUODENOSCOPY (EGD) WITH PROPOFOL N/A 06/17/2020   Procedure: ESOPHAGOGASTRODUODENOSCOPY (EGD) WITH PROPOFOL;  Surgeon: Rush Landmark Telford Nab., MD;  Location: [facility];  Service: Gastroenterology;  Laterality: N/A;   ESOPHAGOGASTRODUODENOSCOPY (EGD)  WITH PROPOFOL N/A 07/01/2020   Procedure: ESOPHAGOGASTRODUODENOSCOPY (EGD) WITH PROPOFOL;  Surgeon: Mauri Pole, MD;  Location: [facility];  Service: Endoscopy;  Laterality: N/A;   ESOPHAGOGASTRODUODENOSCOPY (EGD) WITH PROPOFOL N/A 08/10/2021   Procedure: ESOPHAGOGASTRODUODENOSCOPY (EGD) WITH PROPOFOL;  Surgeon: Daryel November, MD;  Location: [facility];  Service: Gastroenterology;  Laterality: N/A;   FOREIGN BODY REMOVAL  06/17/2020   Procedure: FOREIGN BODY REMOVAL;  Surgeon: Rush Landmark Telford Nab., MD;  Location: [facility];  Service: Gastroenterology;;   GI RADIOFREQUENCY ABLATION  06/17/2020   Procedure: GI RADIOFREQUENCY ABLATION;  Surgeon: Irving Copas., MD;  Location: [facility];  Service: Gastroenterology;;   HEMOSTASIS CLIP PLACEMENT  06/17/2020   Procedure: HEMOSTASIS CLIP PLACEMENT;  Surgeon: Irving Copas., MD;  Location: [facility];   Service: Gastroenterology;;   HEMOSTASIS CLIP PLACEMENT  07/01/2020   Procedure: HEMOSTASIS CLIP PLACEMENT;  Surgeon: Mauri Pole, MD;  Location: [facility];  Service: Endoscopy;;   HOT HEMOSTASIS N/A 07/01/2020   Procedure: HOT HEMOSTASIS (ARGON PLASMA COAGULATION/BICAP);  Surgeon: Mauri Pole, MD;  Location: [facility];  Service: Endoscopy;  Laterality: N/A;   HOT HEMOSTASIS  08/10/2021   Procedure: HOT HEMOSTASIS (ARGON PLASMA COAGULATION/BICAP);  Surgeon: Daryel November, MD;  Location: [facility];  Service: Gastroenterology;;   IR PARACENTESIS  08/19/2020   IR PARACENTESIS  09/26/2020   IR PARACENTESIS  11/03/2020   IR PARACENTESIS  11/28/2020   IR PARACENTESIS  12/21/2020   IR PARACENTESIS  01/17/2021   IR PARACENTESIS  02/08/2021   IR PARACENTESIS  02/23/2021   IR PARACENTESIS  03/22/2021   IR PARACENTESIS  04/06/2021   IR PARACENTESIS  04/20/2021   IR PARACENTESIS  05/03/2021   IR PARACENTESIS  05/16/2021   IR PARACENTESIS  05/29/2021   IR PARACENTESIS  06/07/2021   IR PARACENTESIS  06/15/2021   IR PARACENTESIS  06/28/2021   IR PARACENTESIS  07/05/2021   IR PARACENTESIS  07/14/2021   IR PARACENTESIS  07/21/2021   IR PARACENTESIS  08/01/2021   IR RADIOLOGIST EVAL & MGMT  01/24/2017   IR RADIOLOGIST EVAL & MGMT  02/22/2021   IR RADIOLOGIST EVAL & MGMT  03/28/2021   IR RADIOLOGIST EVAL & MGMT  06/06/2021   KNEE CARTILAGE SURGERY Bilateral    NEPHRECTOMY Left 02/10/2015   Procedure: OPEN RETROPERINTONEAL EXPLORATION LEFT RENAL  CYST DECORTICATION X 5;  Surgeon: Cleon Gustin, MD;  Location: [facility];  Service: Urology;  Laterality: Left;   PARTIAL COLECTOMY N/A 02/10/2015   Procedure: OPEN RIGHT  COLECTOMY ;  Surgeon: [name], MD;  Location: [facility];  Service: General;  Laterality: N/A;   POLYPECTOMY  06/17/2020   Procedure: POLYPECTOMY;  Surgeon: Irving Copas., MD;  Location: [facility];  Service: Gastroenterology;;   RADIOLOGY WITH  ANESTHESIA N/A 03/08/2021   Procedure: CT MICROWAVE ABLATION WITH ANESTHESIA;  Surgeon: Arne Cleveland, MD;  Location: [facility];  Service: Radiology;  Laterality: N/A;   TUBAL LIGATION        Social History:      Social History   Tobacco Use   Smoking status: Never   Smokeless tobacco: Never  Substance Use Topics   Alcohol use: No       Family History :     Family History  Problem Relation Age of Onset   Diabetes Mother    Hypertension Other    COPD Father    Hypertension Sister    GER disease Sister    Heart attack Brother  COPD Brother    Lung cancer Brother    COPD Brother    Hypertension Brother       Home Medications:   Prior to Admission medications   Medication Sig Start Date End Date Taking? Authorizing Provider  ACCU-CHEK AVIVA PLUS test strip  10/20/18   [provider]  acetaminophen (TYLENOL) 500 MG tablet Take 500 mg by mouth daily as needed for moderate pain.    [provider]  benzonatate (TESSALON PERLES) 100 MG capsule Take 1 capsule (100 mg total) by mouth 3 (three) times daily as needed for cough. 08/11/21 08/11/22  Lavina Hamman, MD  glipiZIDE (GLUCOTROL XL) 5 MG 24 hr tablet Take 5 mg by mouth daily with breakfast.    [provider]  guaiFENesin-dextromethorphan (ROBITUSSIN DM) 100-10 MG/5ML syrup Take 5 mLs by mouth every 4 (four) hours as needed for cough. 08/11/21   Lavina Hamman, MD  lactose free nutrition (BOOST PLUS) LIQD Take 237 mLs by mouth 3 (three) times daily with meals. 08/11/21   Lavina Hamman, MD  levothyroxine (SYNTHROID) 88 MCG tablet Take 1 tablet (88 mcg total) by mouth daily. 12/04/19   Brunetta Jeans, PA-C  loperamide (IMODIUM) 2 MG capsule Take 2 mg by mouth as needed for diarrhea or loose stools.    [provider]  LORazepam (ATIVAN) 0.5 MG tablet Take 0.5 mg by mouth 2 (two) times daily as needed for anxiety.    [provider]  ondansetron (ZOFRAN) 4 MG tablet Take 1  tablet (4 mg total) by mouth daily as needed for nausea or vomiting. 08/11/21 08/11/22  Lavina Hamman, MD  pantoprazole (PROTONIX) 40 MG tablet Take 1 tablet (40 mg total) by mouth 2 (two) times daily before a meal. 08/11/21   Lavina Hamman, MD  Simethicone (GAS-X PO) Take 1 tablet by mouth as needed (flatulence).    [provider]  sucralfate (CARAFATE) 1 g tablet Take 1 tablet (1 g total) by mouth 2 (two) times daily. 08/11/21   Lavina Hamman, MD  Vitamin D, Ergocalciferol, (DRISDOL) 1.25 MG (50000 UNIT) CAPS capsule Take 50,000 Units by mouth once a week. Saturday 06/01/21   [provider]  rivaroxaban (XARELTO) 20 MG TABS tablet Take 1 tablet (20 mg total) by mouth daily with supper. Patient not taking: Reported on 08/04/2020 07/08/20 08/10/20  Eugenie Filler, MD     Allergies:     Allergies  Allergen Reactions   Aspirin Other (See Comments)    Runny nose  Other reaction(s): runny nose   Codeine Nausea And Vomiting    Other reaction(s): nausea, voiting     Physical Exam:   Vitals  Blood pressure 127/61, pulse 80, temperature 97.9 F (36.6 C), temperature source Oral, resp. rate 18, weight 84.4 kg, SpO2 96 %.  1.  General: Appears in no acute distress  2. Psychiatric: Alert, oriented x3, intact insight and judgment  3. Neurologic: Cranial nerves II through XII grossly intact, no focal deficit noted, moving all extremities  4. HEENMT:  Atraumatic normocephalic, extraocular' muscles are intact  5. Respiratory : Clear to auscultation bilaterally  6. Cardiovascular : S1-S2, regular, no murmur auscultated  7. Gastrointestinal:  Abdomen is soft, nontender, distended, positive shifting dullness  8. Skin:  No rashes noted       Data Review:    CBC Recent Labs  Lab 08/18/21 1527  WBC 4.8  HGB 10.2*  HCT 32.1*  PLT 111*  MCV  91.2  MCH 29.0  MCHC 31.8  RDW 15.7*  LYMPHSABS 0.9  MONOABS 0.3  EOSABS 0.0  BASOSABS 0.0    ------------------------------------------------------------------------------------------------------------------  Results for orders placed or performed during the hospital encounter of 08/18/21 (from the past 48 hour(s))  Comprehensive metabolic panel     Status: Abnormal   Collection Time: 08/18/21  3:27 PM  Result Value Ref Range   Sodium 135 135 - 145 mmol/L   Potassium 4.7 3.5 - 5.1 mmol/L   Chloride 108 98 - 111 mmol/L   CO2 18 (L) 22 - 32 mmol/L   Glucose, Bld 105 (H) 70 - 99 mg/dL    Comment: Glucose reference range applies only to samples taken after fasting for at least 8 hours.   BUN 58 (H) 8 - 23 mg/dL   Creatinine, Ser 3.36 (H) 0.44 - 1.00 mg/dL   Calcium 8.4 (L) 8.9 - 10.3 mg/dL   Total Protein 5.5 (L) 6.5 - 8.1 g/dL   Albumin 2.7 (L) 3.5 - 5.0 g/dL   AST 33 15 - 41 U/L   ALT 15 0 - 44 U/L   Alkaline Phosphatase 82 38 - 126 U/L   Total Bilirubin 0.9 0.3 - 1.2 mg/dL   GFR, Estimated 13 (L) >60 mL/min    Comment: (NOTE) Calculated using the CKD-EPI Creatinine Equation (2021)    Anion gap 9 5 - 15    Comment: Performed at [facility], [place] [address], [place], [place] [address]  CBC with Differential     Status: Abnormal   Collection Time: 08/18/21  3:27 PM  Result Value Ref Range   WBC 4.8 4.0 - 10.5 K/uL   RBC 3.52 (L) 3.87 - 5.11 MIL/uL   Hemoglobin 10.2 (L) 12.0 - 15.0 g/dL   HCT 32.1 (L) 36.0 - 46.0 %   MCV 91.2 80.0 - 100.0 fL   MCH 29.0 26.0 - 34.0 pg   MCHC 31.8 30.0 - 36.0 g/dL   RDW 15.7 (H) 11.5 - 15.5 %   Platelets 111 (L) 150 - 400 K/uL    Comment: Immature Platelet Fraction may be clinically indicated, consider ordering this additional test [id number]    nRBC 0.0 0.0 - 0.2 %   Neutrophils Relative % 74 %   Neutro Abs 3.5 1.7 - 7.7 K/uL   Lymphocytes Relative 19 %   Lymphs Abs 0.9 0.7 - 4.0 K/uL   Monocytes Relative 6 %   Monocytes Absolute 0.3 0.1 - 1.0 K/uL   Eosinophils Relative 1 %   Eosinophils Absolute 0.0 0.0 -  0.5 K/uL   Basophils Relative 0 %   Basophils Absolute 0.0 0.0 - 0.1 K/uL   Immature Granulocytes 0 %   Abs Immature Granulocytes 0.01 0.00 - 0.07 K/uL    Comment: Performed at [facility], [place] [address], [place], [place] [address]  Lipase, blood     Status: None   Collection Time: 08/18/21  3:27 PM  Result Value Ref Range   Lipase 43 11 - 51 U/L    Comment: Performed at [facility], [address] [place], [place] [address]    Chemistries  Recent Labs  Lab 08/18/21 1527  NA 135  K 4.7  CL 108  CO2 18*  GLUCOSE 105*  BUN 58*  CREATININE 3.36*  CALCIUM 8.4*  AST 33  ALT 15  ALKPHOS 82  BILITOT 0.9   ------------------------------------------------------------------------------------------------------------------  ------------------------------------------------------------------------------------------------------------------ GFR: Estimated Creatinine Clearance: 15.2 mL/min (A) (by C-G  formula based on SCr of 3.36 mg/dL (H)). Liver Function Tests: Recent Labs  Lab 08/18/21 1527  AST 33  ALT 15  ALKPHOS 82  BILITOT 0.9  PROT 5.5*  ALBUMIN 2.7*   Recent Labs  Lab 08/18/21 1527  LIPASE 43   No results for input(s): AMMONIA in the last 168 hours. Coagulation Profile: No results for input(s): INR, PROTIME in the last 168 hours. Cardiac Enzymes: No results for input(s): CKTOTAL, CKMB, CKMBINDEX, TROPONINI in the last 168 hours. BNP (last 3 results) No results for input(s): PROBNP in the last 8760 hours. HbA1C: No results for input(s): HGBA1C in the last 72 hours. CBG: No results for input(s): GLUCAP in the last 168 hours. Lipid Profile: No results for input(s): CHOL, HDL, LDLCALC, TRIG, CHOLHDL, LDLDIRECT in the last 72 hours. Thyroid Function Tests: No results for input(s): TSH, T4TOTAL, FREET4, T3FREE, THYROIDAB in the last 72 hours. Anemia Panel: No results for input(s): VITAMINB12, FOLATE, FERRITIN, TIBC, IRON,  RETICCTPCT in the last 72 hours.  --------------------------------------------------------------------------------------------------------------- Urine analysis:    Component Value Date/Time   COLORURINE YELLOW 08/08/2021 0030   APPEARANCEUR HAZY (A) 08/08/2021 0030   LABSPEC 1.009 08/08/2021 0030   PHURINE 5.0 08/08/2021 0030   GLUCOSEU NEGATIVE 08/08/2021 0030   HGBUR NEGATIVE 08/08/2021 0030   BILIRUBINUR NEGATIVE 08/08/2021 0030   BILIRUBINUR Negative 12/04/2019 1352   KETONESUR NEGATIVE 08/08/2021 0030   PROTEINUR NEGATIVE 08/08/2021 0030   UROBILINOGEN 0.2 12/04/2019 1352   UROBILINOGEN 0.2 02/07/2011 1832   NITRITE NEGATIVE 08/08/2021 0030   LEUKOCYTESUR MODERATE (A) 08/08/2021 0030      Imaging Results:    No results found.  My personal review of EKG: Rhythm NSR, no ST-T changes   Assessment & Plan:    Principal Problem:   Failure to thrive in adult   Failure to thrive-patient presenting with poor p.o. intake, nausea and diarrhea.  She has end-stage liver disease; MELD score 24 with almost 20% mortality in 3 months.  We will start D5 normal saline at 50 mL/h. Liver cirrhosis-patient has advanced liver cirrhosis, MELD score 24 as above.  She has significant ascites, renal impairment with creatinine 3.36 today, also has recurrent ascites requiring paracentesis every 8 to 10 days.  GI had recommended TIPS procedure, initially patient had refused but now she is willing to undergo TIPS procedure.  We will consult gastroenterology for further recommendations regarding TIPS. Ascites-patient has ascites, but paracentesis on 08/11/2021.  Will hold further paracentesis for now considering worsening renal function.  She is not symptomatic from ascites at this time. Acute kidney injury on CKD stage IIIb-patient baseline creatinine was around 2, presented with creatinine of 4 during previous admission.  Creatinine today is 3.36 which is slightly worse from previous creatinine of 3.15  on 08/11/2021.  Started on IV fluids as above.  Follow serum creatinine a.m. Cough-patient has been having cough for past 1 week.  She denies fever or chills.  Denies coughing up any phlegm.  We will check chest x-ray Diabetes mellitus type 2-hold oral hypoglycemic agents, start on sliding scale insulin with NovoLog.  Check CBG every 6 hours  Intractable nausea-denies vomiting.  Will start Zofran 4 mg IV every 6 hours as needed for nausea and vomiting. Diarrhea-C. difficile PCR and GI pathogen panel was negative during previous admission a week ago.  CT abdomen was negative for acute abnormality.  Will start Imodium as needed. Adenocarcinoma of colon-stable Iron deficiency anemia due to chronic blood loss-underwent EGD last week which  showed bile gastritis treated with APC.  Hemoglobin today has improved to 10.2. Hypothyroidism-continue Synthroid Severe protein calorie malnutrition   DVT Prophylaxis-    SCDs   AM Labs Ordered, also please review Full Orders  Family Communication: Admission, patients condition and plan of care including tests being ordered have been discussed with the patient and her son who indicate understanding and agree with the plan and Code Status.  Code Status: Partial code, no intubation or mechanical ventilation  Admission status: Inpatient :The appropriate admission status for this patient is INPATIENT. Inpatient status is judged to be reasonable and necessary in order to provide the required intensity of service to ensure the patient's safety. The patient's presenting symptoms, physical exam findings, and initial radiographic and laboratory data in the context of their chronic comorbidities is felt to place them at high risk for further clinical deterioration. Furthermore, it is not anticipated that the patient will be medically stable for discharge from the hospital within 2 midnights of admission. The following factors support the admission status of inpatient.      Time spent in minutes : 60 minutes   Meli Faley S Caidon Foti M.D

## 2021-08-19 DIAGNOSIS — R188 Other ascites: Secondary | ICD-10-CM | POA: Diagnosis not present

## 2021-08-19 DIAGNOSIS — U071 COVID-19: Secondary | ICD-10-CM | POA: Diagnosis not present

## 2021-08-19 DIAGNOSIS — K7460 Unspecified cirrhosis of liver: Secondary | ICD-10-CM

## 2021-08-19 DIAGNOSIS — R627 Adult failure to thrive: Secondary | ICD-10-CM | POA: Diagnosis not present

## 2021-08-19 DIAGNOSIS — K7469 Other cirrhosis of liver: Secondary | ICD-10-CM

## 2021-08-19 DIAGNOSIS — N189 Chronic kidney disease, unspecified: Secondary | ICD-10-CM

## 2021-08-19 LAB — CBC
HCT: 31.1 % — ABNORMAL LOW (ref 36.0–46.0)
Hemoglobin: 9.7 g/dL — ABNORMAL LOW (ref 12.0–15.0)
MCH: 28.6 pg (ref 26.0–34.0)
MCHC: 31.2 g/dL (ref 30.0–36.0)
MCV: 91.7 fL (ref 80.0–100.0)
Platelets: 117 10*3/uL — ABNORMAL LOW (ref 150–400)
RBC: 3.39 MIL/uL — ABNORMAL LOW (ref 3.87–5.11)
RDW: 15.6 % — ABNORMAL HIGH (ref 11.5–15.5)
WBC: 4 10*3/uL (ref 4.0–10.5)
nRBC: 0 % (ref 0.0–0.2)

## 2021-08-19 LAB — COMPREHENSIVE METABOLIC PANEL
ALT: 12 U/L (ref 0–44)
AST: 26 U/L (ref 15–41)
Albumin: 2.5 g/dL — ABNORMAL LOW (ref 3.5–5.0)
Alkaline Phosphatase: 79 U/L (ref 38–126)
Anion gap: 9 (ref 5–15)
BUN: 52 mg/dL — ABNORMAL HIGH (ref 8–23)
CO2: 18 mmol/L — ABNORMAL LOW (ref 22–32)
Calcium: 8 mg/dL — ABNORMAL LOW (ref 8.9–10.3)
Chloride: 106 mmol/L (ref 98–111)
Creatinine, Ser: 3.06 mg/dL — ABNORMAL HIGH (ref 0.44–1.00)
GFR, Estimated: 15 mL/min — ABNORMAL LOW (ref >60)
Glucose, Bld: 108 mg/dL — ABNORMAL HIGH (ref 70–99)
Potassium: 4.4 mmol/L (ref 3.5–5.1)
Sodium: 133 mmol/L — ABNORMAL LOW (ref 135–145)
Total Bilirubin: 1.1 mg/dL (ref 0.3–1.2)
Total Protein: 5.2 g/dL — ABNORMAL LOW (ref 6.5–8.1)

## 2021-08-19 LAB — D-DIMER, QUANTITATIVE: D-Dimer, Quant: 3.72 ug/mL-FEU — ABNORMAL HIGH (ref 0.00–0.50)

## 2021-08-19 LAB — CBG MONITORING, ED
Glucose-Capillary: 105 mg/dL — ABNORMAL HIGH (ref 70–99)
Glucose-Capillary: 119 mg/dL — ABNORMAL HIGH (ref 70–99)
Glucose-Capillary: 94 mg/dL (ref 70–99)
Glucose-Capillary: 97 mg/dL (ref 70–99)

## 2021-08-19 LAB — PROTIME-INR
INR: 1.3 — ABNORMAL HIGH (ref 0.8–1.2)
Prothrombin Time: 16.4 seconds — ABNORMAL HIGH (ref 11.4–15.2)

## 2021-08-19 LAB — C-REACTIVE PROTEIN: CRP: 7.8 mg/dL — ABNORMAL HIGH (ref <1.0)

## 2021-08-19 LAB — GLUCOSE, CAPILLARY: Glucose-Capillary: 116 mg/dL — ABNORMAL HIGH (ref 70–99)

## 2021-08-19 MED ORDER — MOLNUPIRAVIR EUA 200MG CAPSULE
4.0000 | ORAL_CAPSULE | Freq: Two times a day (BID) | ORAL | Status: AC
  Administered 2021-08-19 – 2021-08-23 (×10): 800 mg via ORAL
  Filled 2021-08-19: qty 4

## 2021-08-19 MED ORDER — PANTOPRAZOLE SODIUM 40 MG PO TBEC
40.0000 mg | DELAYED_RELEASE_TABLET | Freq: Every day | ORAL | Status: DC
  Administered 2021-08-19 – 2021-08-24 (×6): 40 mg via ORAL
  Filled 2021-08-19 (×6): qty 1

## 2021-08-19 NOTE — Progress Notes (Signed)
No urinary output since on unit approximately 4 hours; Patient reports urinating and having bowel movement "right before coming up here" on unit. States does not feel like needs to urinate at this time; Bladder Scan performed and shows 286ml of urine; will continue to monitor

## 2021-08-19 NOTE — Progress Notes (Addendum)
[facility]  PROGRESS NOTE  SADI ARAVE DTO:[id number] DOB: December 17, 1940 DOA: 08/18/2021 PCP: Wannetta Sender, FNP   Brief HPI:   81 year old female with medical history of diabetes mellitus type 2, liver cirrhosis, adenocarcinoma of the colon, recurrent ascites, portal hypertensive gastropathy, esophageal varices, hypothyroidism, hypertension came to hospital with poor p.o. intake, nausea and diarrhea.  She was just discharged from the hospital on 08/11/2021 after she was admitted for acute kidney injury on CKD.  There was discussion regarding performing TIPS procedure as outpatient.  Gastroenterology was consulted  In the hospital patient was found to have COVID-19 infection.  Not requiring oxygen.  Subjective   Patient seen and examined, denies shortness of breath.  Not requiring oxygen.  Still has poor appetite.   Assessment/Plan:    COVID-19 infection, present on admission -Presented with cough, poor appetite, diarrhea, no shortness of breath -SARS COVID 2 RT-PCR positive -Patient is not hypoxic, not requiring oxygen, chest x-ray shows no infiltrate -We will start molnupiravir to prevent worsening of COVID-19 infection considering immunocompromise status with multiple comorbid conditions -Check CRP, D-dimer  End-stage liver disease -patient has [name] cirrhosis, MELD score 24.   -GI was consulted for possibility of TIPS procedure; however GI has recommended against TIPS procedure due to possibility of worsening encephalopathy -Consider palliative care consult if patient condition continues to deteriorate  Recurrent ascites -Secondary to [name] cirrhosis -Last paracentesis was on 08/11/2021 -She has ascites but not symptomatic -Paracentesis as needed; will hold paracentesis at this time  Acute kidney injury on CKD stage IIIb -Presented with creatinine of 3.36 -Creatinine is down to 3.06 -Continue D5 normal saline at 50 mill per hour  Diabetes mellitus type  2 -Continue sliding scale insulin NovoLog -CBG well controlled  Iron-deficiency anemia due to chronic blood loss -Underwent EGD last week which showed mild gastritis treated with APC -Hemoglobin today is 9.7  Hypothyroidism -Continue Synthroid     Medications     insulin aspart  0-9 Units Subcutaneous TID WC   levothyroxine  88 mcg Oral Daily   molnupiravir EUA  4 capsule Oral BID   pantoprazole  40 mg Oral Q0600     Data Reviewed:   CBG:  Recent Labs  Lab 08/19/21 0051 08/19/21 0618 08/19/21 0825 08/19/21 1210  GLUCAP 94 97 105* 119*    SpO2: 99 %    Vitals:   08/19/21 1200 08/19/21 1300 08/19/21 1418 08/19/21 1457  BP: 126/67 (!) 135/55 137/63 129/64  Pulse: 91 100 90 85  Resp: 17 17  18   Temp:   (!) 97.2 F (36.2 C) 97.6 F (36.4 C)  TempSrc:   Oral Oral  SpO2: 95% 97% 99% 99%  Weight:          Data Reviewed:  Basic Metabolic Panel: Recent Labs  Lab 08/18/21 1527 08/19/21 0535  NA 135 133*  K 4.7 4.4  CL 108 106  CO2 18* 18*  GLUCOSE 105* 108*  BUN 58* 52*  CREATININE 3.36* 3.06*  CALCIUM 8.4* 8.0*    CBC: Recent Labs  Lab 08/18/21 1527 08/19/21 0535  WBC 4.8 4.0  NEUTROABS 3.5  --   HGB 10.2* 9.7*  HCT 32.1* 31.1*  MCV 91.2 91.7  PLT 111* 117*       Antibiotics: Anti-infectives (From admission, onward)    Start     Dose/Rate Route Frequency Ordered Stop   08/19/21 1300  molnupiravir EUA (LAGEVRIO) capsule 800 mg  4 capsule Oral 2 times daily 08/19/21 1217 08/24/21 0959        DVT prophylaxis: SCDs  Code Status: Full code  Family Communication: No family at bedside      Objective    Physical Examination:   General-appears in no acute distress Heart-S1-S2, regular, no murmur auscultated Lungs-clear to auscultation bilaterally, no wheezing or crackles auscultated Abdomen-soft, distended, nontender, no organomegaly Extremities-no edema in the lower extremities Neuro-alert, oriented x3, no focal  deficit noted  Status is: Inpatient for COVID-19 infection         [place]   [facility] If 7PM-7AM, please contact night-coverage at [email or web address], Office  [phone]   08/19/2021, 3:12 PM  LOS: 1 day

## 2021-08-19 NOTE — Consult Note (Addendum)
Referring Provider:   Dr. 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  1.  COVID-19 --this presentation most likely related to COVID-19 infection.  This likely explains her recent cough as well as possibly some of her GI symptoms including nausea vomiting. --We will defer COVID-19 therapy to hospital medicine  2.  Decompensated NASH cirrhosis with portal hypertension/diuretic refractory ascites --her liver disease is advanced and she has had very frequent large-volume paracentesis.  Diuretics have been contraindicated due to her significant chronic kidney disease.  She is also had an acute on chronic kidney injury.  Her MELD score is 24 and she is CTP B, both of these indicate the severity of her liver dysfunction.  I do not recommend TIPS at this time.  TIPS for her does come with a significant risk of further hepatic decompensation including death.  She has not had hepatic encephalopathy but this would certainly be a high risk for her.  Her cardiac function does not appear to preclude TIPS, but there would be a risk of cardiac decompensation as well. --TIPS not recommended now; we can discuss this further as an outpatient --Hold diuretics due to significant renal dysfunction --Low-sodium diet --Repeat large-volume paracentesis with IV albumin  when needed for symptoms --HCC previously treated and stable by recent imaging --May need to involve palliative care in the near future given ongoing issues with decompensated liver disease, renal dysfunction  2.  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  No evidence for ongoing GI blood loss.  Continue iron supplementation indefinitely as well as PPI.  3.  Nausea/vomiting --not an issue here, but Zofran as needed   Discussed at length with patient's son by phone, [name] We discussed the severity of her liver disease including the possibility of palliative TIPS.  For now we will continue inpatient monitoring, COVID-19 treatment   HPI:     Cheryl Jordan is a 81 y.o. 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  Recent admission and discharge with decompensated NASH cirrhosis, ascites, acute on chronic multifactorial IDA.  EGD was performed by Dr. Candis Schatz just a few days ago on 08/07/2021.  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  Echocardiogram completed during last hospitalization showed normal LVEF, right ventricular systolic function appeared normal.  No definitive TR.  Mitral valve was normal.  Aortic valve had calcification with mild stenosis.  Mean aortic valve gradient was 17.  Today she reports that she just feels bad all over.  She denies specific abdominal pain, nausea or vomiting.  No shortness of breath but intermittently reports cough.  She was having some nausea and intermittent  vomiting at home but none since being here.  No appetite.   Past Medical History:  Diagnosis Date   Allergy    Anemia    hx of   Anxiety    Aortic stenosis    mild by 04/08/20 echo   Arthritis    Colon cancer ([place]) dx'd 11/2014   Diabetes mellitus without complication (HCC)    TYPE II   Dyspnea    Fatty liver    Gallbladder disease    GERD (gastroesophageal reflux disease)    Heart murmur    History of gout    Hypertension    Hypothyroidism    Kidney disorder    spot on left kidney   Osteoporosis    PONV (postoperative nausea and vomiting)     Past Surgical History:  Procedure Laterality Date   BIOPSY  06/17/2020   Procedure: BIOPSY;  Surgeon: Irving Copas., MD;  Location: [facility];  Service: Gastroenterology;;   CATARACT EXTRACTION Left    CHOLECYSTECTOMY     COLONOSCOPY WITH PROPOFOL N/A 06/17/2020   Procedure: COLONOSCOPY WITH PROPOFOL;  Surgeon: Irving Copas., MD;  Location: [facility];  Service: Gastroenterology;  Laterality: N/A;   COLONOSCOPY WITH PROPOFOL N/A 07/01/2020   Procedure: COLONOSCOPY WITH PROPOFOL;  Surgeon: Mauri Pole, MD;  Location: [facility];  Service: Endoscopy;  Laterality: N/A;   ESOPHAGOGASTRODUODENOSCOPY (EGD) WITH PROPOFOL N/A 06/17/2020   Procedure: ESOPHAGOGASTRODUODENOSCOPY (EGD) WITH PROPOFOL;  Surgeon: Rush Landmark Telford Nab., MD;  Location: [facility];  Service: Gastroenterology;  Laterality: N/A;   ESOPHAGOGASTRODUODENOSCOPY (EGD) WITH PROPOFOL N/A 07/01/2020   Procedure: ESOPHAGOGASTRODUODENOSCOPY (EGD) WITH PROPOFOL;  Surgeon: Mauri Pole, MD;  Location: [facility];  Service: Endoscopy;  Laterality: N/A;   ESOPHAGOGASTRODUODENOSCOPY (EGD) WITH PROPOFOL N/A 08/10/2021   Procedure: ESOPHAGOGASTRODUODENOSCOPY (EGD) WITH PROPOFOL;  Surgeon: Daryel November, MD;  Location: [facility];  Service: Gastroenterology;  Laterality: N/A;   FOREIGN BODY REMOVAL  06/17/2020   Procedure: FOREIGN  BODY REMOVAL;  Surgeon: Rush Landmark Telford Nab., MD;  Location: [facility];  Service: Gastroenterology;;   GI RADIOFREQUENCY ABLATION  06/17/2020   Procedure: GI RADIOFREQUENCY ABLATION;  Surgeon: Irving Copas., MD;  Location: [facility];  Service: Gastroenterology;;   HEMOSTASIS CLIP PLACEMENT  06/17/2020   Procedure: HEMOSTASIS CLIP PLACEMENT;  Surgeon: Irving Copas., MD;  Location: [facility];  Service: Gastroenterology;;   HEMOSTASIS CLIP PLACEMENT  07/01/2020   Procedure: HEMOSTASIS CLIP PLACEMENT;  Surgeon: Mauri Pole, MD;  Location: [facility];  Service: Endoscopy;;   HOT HEMOSTASIS N/A 07/01/2020   Procedure: HOT HEMOSTASIS (ARGON PLASMA COAGULATION/BICAP);  Surgeon: Mauri Pole, MD;  Location: [facility];  Service: Endoscopy;  Laterality: N/A;   HOT HEMOSTASIS  08/10/2021   Procedure: HOT HEMOSTASIS (ARGON PLASMA COAGULATION/BICAP);  Surgeon: Daryel November, MD;  Location: [facility];  Service: Gastroenterology;;   IR PARACENTESIS  08/19/2020   IR PARACENTESIS  09/26/2020   IR PARACENTESIS  11/03/2020   IR PARACENTESIS  11/28/2020   IR PARACENTESIS  12/21/2020   IR PARACENTESIS  01/17/2021   IR PARACENTESIS  02/08/2021   IR PARACENTESIS  02/23/2021   IR PARACENTESIS  03/22/2021   IR PARACENTESIS  04/06/2021   IR PARACENTESIS  04/20/2021   IR PARACENTESIS  05/03/2021   IR  PARACENTESIS  05/16/2021   IR PARACENTESIS  05/29/2021   IR PARACENTESIS  06/07/2021   IR PARACENTESIS  06/15/2021   IR PARACENTESIS  06/28/2021   IR PARACENTESIS  07/05/2021   IR PARACENTESIS  07/14/2021   IR PARACENTESIS  07/21/2021   IR PARACENTESIS  08/01/2021   IR RADIOLOGIST EVAL & MGMT  01/24/2017   IR RADIOLOGIST EVAL & MGMT  02/22/2021   IR RADIOLOGIST EVAL & MGMT  03/28/2021   IR RADIOLOGIST EVAL & MGMT  06/06/2021   KNEE CARTILAGE SURGERY Bilateral    NEPHRECTOMY Left 02/10/2015   Procedure: OPEN RETROPERINTONEAL EXPLORATION LEFT RENAL  CYST  DECORTICATION X 5;  Surgeon: Cleon Gustin, MD;  Location: [facility];  Service: Urology;  Laterality: Left;   PARTIAL COLECTOMY N/A 02/10/2015   Procedure: OPEN RIGHT  COLECTOMY ;  Surgeon: [name], MD;  Location: [facility];  Service: General;  Laterality: N/A;   POLYPECTOMY  06/17/2020   Procedure: POLYPECTOMY;  Surgeon: Irving Copas., MD;  Location: [facility];  Service: Gastroenterology;;   RADIOLOGY WITH ANESTHESIA N/A 03/08/2021   Procedure: CT MICROWAVE ABLATION WITH ANESTHESIA;  Surgeon: Arne Cleveland, MD;  Location: [facility];  Service: Radiology;  Laterality: N/A;   TUBAL LIGATION      Prior to Admission medications   Medication Sig Start Date End Date Taking? Authorizing Provider  benzonatate (TESSALON PERLES) 100 MG capsule Take 1 capsule (100 mg total) by mouth 3 (three) times daily as needed for cough. 08/11/21 08/11/22 Yes Lavina Hamman, MD  LORazepam (ATIVAN) 0.5 MG tablet Take 0.5 mg by mouth 2 (two) times daily as needed for anxiety.   Yes [provider]  pantoprazole (PROTONIX) 40 MG tablet Take 1 tablet (40 mg total) by mouth 2 (two) times daily before a meal. 08/11/21  Yes Lavina Hamman, MD  rivaroxaban (XARELTO) 20 MG TABS tablet Take 20 mg by mouth daily with supper.   Yes [provider]  sucralfate (CARAFATE) 1 g tablet Take 1 tablet (1 g total) by mouth 2 (two) times daily. Patient taking differently: Take 0.5 g by mouth 2 (two) times daily. 08/11/21  Yes Lavina Hamman, MD  ACCU-CHEK AVIVA PLUS test strip  10/20/18   [provider]  acetaminophen (TYLENOL) 500 MG tablet Take 500 mg by mouth daily as needed for moderate pain.    [provider]  glipiZIDE (GLUCOTROL XL) 5 MG 24 hr tablet Take 5 mg by mouth daily with breakfast.    [provider]  guaiFENesin-dextromethorphan (ROBITUSSIN DM) 100-10 MG/5ML syrup Take 5 mLs by mouth every 4 (four) hours as needed for cough. 08/11/21   Lavina Hamman, MD  lactose  free nutrition (BOOST PLUS) LIQD Take 237 mLs by mouth 3 (three) times daily with meals. 08/11/21   Lavina Hamman, MD  levothyroxine (SYNTHROID) 88 MCG tablet Take 1 tablet (88 mcg total) by mouth daily. 12/04/19   Brunetta Jeans, PA-C  loperamide (IMODIUM) 2 MG capsule Take 2 mg by mouth as needed for diarrhea or loose stools.    [provider]  ondansetron (ZOFRAN) 4 MG tablet Take 1 tablet (4 mg total) by mouth daily as needed for nausea or vomiting. 08/11/21 08/11/22  Lavina Hamman, MD  Simethicone (GAS-X PO) Take 1 tablet by mouth as needed (flatulence).    [provider]  Vitamin D, Ergocalciferol, (DRISDOL) 1.25 MG (50000 UNIT) CAPS capsule Take 50,000 Units by mouth once a week. Saturday 06/01/21  [provider]    Current Facility-Administered Medications  Medication Dose Route Frequency Provider Last Rate Last Admin   acetaminophen (TYLENOL) tablet 500 mg  500 mg Oral Daily PRN Darrick Meigs, Marge Duncans, MD       benzonatate (TESSALON) capsule 100 mg  100 mg Oral TID PRN Oswald Hillock, MD       dextrose 5 %-0.9 % sodium chloride infusion   Intravenous Continuous Oswald Hillock, MD 50 mL/hr at 08/19/21 0041 New Bag at 08/19/21 0041   insulin aspart (novoLOG) injection 0-9 Units  0-9 Units Subcutaneous TID WC Oswald Hillock, MD       lactose free nutrition (BOOST PLUS) liquid 237 mL  237 mL Oral TID WC Oswald Hillock, MD       levothyroxine (SYNTHROID) tablet 88 mcg  88 mcg Oral Daily Oswald Hillock, MD   88 mcg at 08/19/21 0523   LORazepam (ATIVAN) tablet 0.5 mg  0.5 mg Oral BID PRN Oswald Hillock, MD       ondansetron [facility]) tablet 4 mg  4 mg Oral Q6H PRN Oswald Hillock, MD       Or   ondansetron [facility]) injection 4 mg  4 mg Intravenous Q6H PRN Oswald Hillock, MD   4 mg at 08/19/21 0400   Current Outpatient Medications  Medication Sig Dispense Refill   benzonatate (TESSALON PERLES) 100 MG capsule Take 1 capsule (100 mg total) by mouth 3 (three) times daily as needed  for cough. 30 capsule 0   LORazepam (ATIVAN) 0.5 MG tablet Take 0.5 mg by mouth 2 (two) times daily as needed for anxiety.     pantoprazole (PROTONIX) 40 MG tablet Take 1 tablet (40 mg total) by mouth 2 (two) times daily before a meal. 60 tablet 0   rivaroxaban (XARELTO) 20 MG TABS tablet Take 20 mg by mouth daily with supper.     sucralfate (CARAFATE) 1 g tablet Take 1 tablet (1 g total) by mouth 2 (two) times daily. (Patient taking differently: Take 0.5 g by mouth 2 (two) times daily.) 60 tablet 0   ACCU-CHEK AVIVA PLUS test strip      acetaminophen (TYLENOL) 500 MG tablet Take 500 mg by mouth daily as needed for moderate pain.     glipiZIDE (GLUCOTROL XL) 5 MG 24 hr tablet Take 5 mg by mouth daily with breakfast.     guaiFENesin-dextromethorphan (ROBITUSSIN DM) 100-10 MG/5ML syrup Take 5 mLs by mouth every 4 (four) hours as needed for cough. 118 mL 0   lactose free nutrition (BOOST PLUS) LIQD Take 237 mLs by mouth 3 (three) times daily with meals. 10000 mL 0   levothyroxine (SYNTHROID) 88 MCG tablet Take 1 tablet (88 mcg total) by mouth daily. 90 tablet 2   loperamide (IMODIUM) 2 MG capsule Take 2 mg by mouth as needed for diarrhea or loose stools.     ondansetron (ZOFRAN) 4 MG tablet Take 1 tablet (4 mg total) by mouth daily as needed for nausea or vomiting. 30 tablet 1   Simethicone (GAS-X PO) Take 1 tablet by mouth as needed (flatulence).     Vitamin D, Ergocalciferol, (DRISDOL) 1.25 MG (50000 UNIT) CAPS capsule Take 50,000 Units by mouth once a week. Saturday      Allergies as of 08/18/2021 - Review Complete 08/18/2021  Allergen Reaction Noted   Aspirin Other (See Comments) 10/16/2011   Codeine Nausea And Vomiting 10/16/2011   Tape Other (See Comments) 08/18/2021  Family History  Problem Relation Age of Onset   Diabetes Mother    Hypertension Other    COPD Father    Hypertension Sister    GER disease Sister    Heart attack Brother    COPD Brother    Lung cancer Brother     COPD Brother    Hypertension Brother     Social History   Tobacco Use   Smoking status: Never   Smokeless tobacco: Never  Vaping Use   Vaping Use: Never used  Substance Use Topics   Alcohol use: No   Drug use: No    Review of Systems: All systems reviewed and negative except where noted in HPI.  Physical Exam: Vital signs in last 24 hours: Temp:  [97.9 F (36.6 C)] 97.9 F (36.6 C) (02/03 1330) Pulse Rate:  [76-101] 85 (02/04 0800) Resp:  [15-21] 15 (02/04 0800) BP: (102-138)/(48-69) 107/48 (02/04 0800) SpO2:  [92 %-99 %] 92 % (02/04 0800) Weight:  [84.4 kg] 84.4 kg (02/03 1315)   Gen: awake, chronically ill-appearing, no acute distress HEENT: anicteric, op clear CV: RRR, no mrg Pulm: CTA b/l Abd: soft,, moderate ascites without being tense or tender, positive bowel sounds Ext: no c/c, trace lower extremity edema Neuro: nonfocal, no asterixis   MELD-Na score: 24 at 08/19/2021  5:35 AM MELD score: 21 at 08/19/2021  5:35 AM Calculated from: Serum Creatinine: 3.06 mg/dL at 08/19/2021  5:35 AM Serum Sodium: 133 mmol/L at 08/19/2021  5:35 AM Total Bilirubin: 1.1 mg/dL at 08/19/2021  5:35 AM INR(ratio): 1.3 at 08/19/2021  5:35 AM Age: 71 years   CTP B (score 9)  Lab Results: Recent Labs    08/18/21 1527 08/19/21 0535  WBC 4.8 4.0  HGB 10.2* 9.7*  HCT 32.1* 31.1*  PLT 111* 117*   BMET Recent Labs    08/18/21 1527 08/19/21 0535  NA 135 133*  K 4.7 4.4  CL 108 106  CO2 18* 18*  GLUCOSE 105* 108*  BUN 58* 52*  CREATININE 3.36* 3.06*  CALCIUM 8.4* 8.0*   LFT Recent Labs    08/19/21 0535  PROT 5.2*  ALBUMIN 2.5*  AST 26  ALT 12  ALKPHOS 79  BILITOT 1.1   PT/INR Recent Labs    08/19/21 0535  LABPROT 16.4*  INR 1.3*   Hepatitis Panel No results for input(s): HEPBSAG, HCVAB, HEPAIGM, HEPBIGM in the last 72 hours.   . CBC Latest Ref Rng & Units 08/19/2021 08/18/2021 08/11/2021  WBC 4.0 - 10.5 K/uL 4.0 4.8 4.9  Hemoglobin 12.0 - 15.0 g/dL 9.7(L)  10.2(L) 8.1(L)  Hematocrit 36.0 - 46.0 % 31.1(L) 32.1(L) 26.6(L)  Platelets 150 - 400 K/uL 117(L) 111(L) 119(L)    . CMP Latest Ref Rng & Units 08/19/2021 08/18/2021 08/11/2021  Glucose 70 - 99 mg/dL 108(H) 105(H) 94  BUN 8 - 23 mg/dL 52(H) 58(H) 42(H)  Creatinine 0.44 - 1.00 mg/dL 3.06(H) 3.36(H) 3.15(H)  Sodium 135 - 145 mmol/L 133(L) 135 136  Potassium 3.5 - 5.1 mmol/L 4.4 4.7 4.5  Chloride 98 - 111 mmol/L 106 108 110  CO2 22 - 32 mmol/L 18(L) 18(L) 20(L)  Calcium 8.9 - 10.3 mg/dL 8.0(L) 8.4(L) 8.2(L)  Total Protein 6.5 - 8.1 g/dL 5.2(L) 5.5(L) 4.6(L)  Total Bilirubin 0.3 - 1.2 mg/dL 1.1 0.9 1.3(H)  Alkaline Phos 38 - 126 U/L 79 82 65  AST 15 - 41 U/L 26 33 13(L)  ALT 0 - 44 U/L 12 15 8    Studies/Results:  DG Chest 2 View  Result Date: 08/18/2021 CLINICAL DATA:  Weakness and loss of appetite. EXAM: CHEST - 2 VIEW COMPARISON:  earlier today FINDINGS: The heart size and mediastinal contours are within normal limits. The lung volumes are low. There is no pleural effusion or edema. No airspace opacities. The visualized skeletal structures are unremarkable. IMPRESSION: Low lung volumes.  No acute findings. Electronically Signed   By: Kerby Moors M.D.   On: 08/18/2021 18:55   DG Chest Port 1 View  Result Date: 08/18/2021 CLINICAL DATA:  Generalized weakness EXAM: PORTABLE CHEST 1 VIEW COMPARISON:  03/02/2021 FINDINGS: Mild left basilar atelectasis. No focal consolidation. No pleural effusion or pneumothorax. Heart and mediastinal contours are unremarkable. No acute osseous abnormality. IMPRESSION: No active disease. Electronically Signed   By: Kathreen Devoid M.D.   On: 08/18/2021 18:34   US LIVER DOPPLER  Result Date: 08/19/2021 CLINICAL DATA:  Cirrhosis EXAM: DUPLEX ULTRASOUND OF LIVER TECHNIQUE: Color and duplex Doppler ultrasound was performed to evaluate the hepatic in-flow and out-flow vessels. COMPARISON:  MRI abdomen 08/11/2021 FINDINGS: Liver: Cirrhotic liver morphology. 2.9 cm echogenic  mass noted in the right liver lobe corresponds to the ablated lesion seen on prior MRI. Main Portal Vein size: 1.5 cm Portal Vein Velocities Main Prox:  15 cm/sec Main Mid: 10 cm/sec Main Dist:  16 cm/sec Right: 9 cm/sec Left: 13 cm/sec Hepatic Vein Velocities Right:  15 cm/sec Middle:  28 cm/sec Left:  19 cm/sec IVC: Present and patent with normal respiratory phasicity. Hepatic Artery Velocity:  72 cm/sec Splenic Vein Velocity:  20 cm/sec Spleen: 13 cm x 9 cm x 12 cm with a total volume of 755 cm^3 (411 cm^3 is upper limit normal) Portal Vein Occlusion/Thrombus: No Splenic Vein Occlusion/Thrombus: No Ascites: Mild abdominal ascites. Varices: None Incidental note made of left renal cyst measuring 6.6 cm and the left pleural effusion. IMPRESSION: 1. Patent portal vein with appropriate direction of flow. 2. Splenomegaly 3. Mild ascites 4. Cirrhotic liver morphology Electronically Signed   By: Miachel Roux M.D.   On: 08/19/2021 08:18    Principal Problem:   Failure to thrive in adult    Silverio Hagan M. Ivry Pigue, M.D. @  08/19/2021, 9:14 AM

## 2021-08-20 DIAGNOSIS — R627 Adult failure to thrive: Secondary | ICD-10-CM | POA: Diagnosis not present

## 2021-08-20 DIAGNOSIS — R188 Other ascites: Secondary | ICD-10-CM | POA: Diagnosis not present

## 2021-08-20 DIAGNOSIS — K7290 Hepatic failure, unspecified without coma: Secondary | ICD-10-CM

## 2021-08-20 DIAGNOSIS — K7581 Nonalcoholic steatohepatitis (NASH): Secondary | ICD-10-CM

## 2021-08-20 DIAGNOSIS — U071 COVID-19: Secondary | ICD-10-CM

## 2021-08-20 DIAGNOSIS — K7460 Unspecified cirrhosis of liver: Secondary | ICD-10-CM | POA: Diagnosis not present

## 2021-08-20 LAB — BASIC METABOLIC PANEL
Anion gap: 8 (ref 5–15)
BUN: 50 mg/dL — ABNORMAL HIGH (ref 8–23)
CO2: 18 mmol/L — ABNORMAL LOW (ref 22–32)
Calcium: 8.1 mg/dL — ABNORMAL LOW (ref 8.9–10.3)
Chloride: 110 mmol/L (ref 98–111)
Creatinine, Ser: 2.87 mg/dL — ABNORMAL HIGH (ref 0.44–1.00)
GFR, Estimated: 16 mL/min — ABNORMAL LOW (ref >60)
Glucose, Bld: 108 mg/dL — ABNORMAL HIGH (ref 70–99)
Potassium: 3.9 mmol/L (ref 3.5–5.1)
Sodium: 136 mmol/L (ref 135–145)

## 2021-08-20 LAB — CBC
HCT: 28.4 % — ABNORMAL LOW (ref 36.0–46.0)
Hemoglobin: 8.9 g/dL — ABNORMAL LOW (ref 12.0–15.0)
MCH: 28.5 pg (ref 26.0–34.0)
MCHC: 31.3 g/dL (ref 30.0–36.0)
MCV: 91 fL (ref 80.0–100.0)
Platelets: 110 10*3/uL — ABNORMAL LOW (ref 150–400)
RBC: 3.12 MIL/uL — ABNORMAL LOW (ref 3.87–5.11)
RDW: 15.7 % — ABNORMAL HIGH (ref 11.5–15.5)
WBC: 4.1 10*3/uL (ref 4.0–10.5)
nRBC: 0 % (ref 0.0–0.2)

## 2021-08-20 LAB — GLUCOSE, CAPILLARY
Glucose-Capillary: 108 mg/dL — ABNORMAL HIGH (ref 70–99)
Glucose-Capillary: 114 mg/dL — ABNORMAL HIGH (ref 70–99)
Glucose-Capillary: 117 mg/dL — ABNORMAL HIGH (ref 70–99)
Glucose-Capillary: 119 mg/dL — ABNORMAL HIGH (ref 70–99)
Glucose-Capillary: 137 mg/dL — ABNORMAL HIGH (ref 70–99)
Glucose-Capillary: 145 mg/dL — ABNORMAL HIGH (ref 70–99)

## 2021-08-20 MED ORDER — LOPERAMIDE HCL 2 MG PO CAPS
2.0000 mg | ORAL_CAPSULE | Freq: Once | ORAL | Status: AC
  Administered 2021-08-20: 2 mg via ORAL
  Filled 2021-08-20: qty 1

## 2021-08-20 NOTE — Progress Notes (Signed)
Progress Note   Subjective  Patient feels slightly better today No bowel movement today loose stools late yesterday for which she took an Imodium Mild nausea no vomiting No abdominal pain Has not yet spoken to family today her phone is not working (I fixed the bedside phone for her by moving the connection to a different port on the [place] board)   Objective  Vital signs in last 24 hours: Temp:  [97.2 F (36.2 C)-98 F (36.7 C)] 98 F (36.7 C) (02/05 0955) Pulse Rate:  [81-100] 99 (02/05 0955) Resp:  [16-18] 16 (02/05 0955) BP: (114-137)/(54-67) 132/63 (02/05 0955) SpO2:  [95 %-99 %] 96 % (02/05 0955)   Gen: awake, alert, NAD HEENT: anicteric CV: mild tachy, regular Pulm: CTA b/l Abd: soft, moderately distended, not tense or tender, +BS throughout Ext: no c/c, trace pretib edema Neuro: nonfocal, no asterixis   Intake/Output from previous day: 02/04 0701 - 02/05 0700 In: 1613.5 [P.O.:540; I.V.:1073.5] Out: 200 [Urine:200] Intake/Output this shift: Total I/O In: 120 [P.O.:120] Out: 200 [Urine:200]  Lab Results: Recent Labs    08/18/21 1527 08/19/21 0535 08/20/21 0454  WBC 4.8 4.0 4.1  HGB 10.2* 9.7* 8.9*  HCT 32.1* 31.1* 28.4*  PLT 111* 117* 110*   BMET Recent Labs    08/18/21 1527 08/19/21 0535 08/20/21 0454  NA 135 133* 136  K 4.7 4.4 3.9  CL 108 106 110  CO2 18* 18* 18*  GLUCOSE 105* 108* 108*  BUN 58* 52* 50*  CREATININE 3.36* 3.06* 2.87*  CALCIUM 8.4* 8.0* 8.1*   LFT Recent Labs    08/19/21 0535  PROT 5.2*  ALBUMIN 2.5*  AST 26  ALT 12  ALKPHOS 79  BILITOT 1.1   PT/INR Recent Labs    08/19/21 0535  LABPROT 16.4*  INR 1.3*   Hepatitis Panel No results for input(s): HEPBSAG, HCVAB, HEPAIGM, HEPBIGM in the last 72 hours.  Studies/Results: DG Chest 2 View  Result Date: 08/18/2021 CLINICAL DATA:  Weakness and loss of appetite. EXAM: CHEST - 2 VIEW COMPARISON:  earlier today FINDINGS: The heart size and mediastinal contours are  within normal limits. The lung volumes are low. There is no pleural effusion or edema. No airspace opacities. The visualized skeletal structures are unremarkable. IMPRESSION: Low lung volumes.  No acute findings. Electronically Signed   By: Kerby Moors M.D.   On: 08/18/2021 18:55   DG Chest Port 1 View  Result Date: 08/18/2021 CLINICAL DATA:  Generalized weakness EXAM: PORTABLE CHEST 1 VIEW COMPARISON:  03/02/2021 FINDINGS: Mild left basilar atelectasis. No focal consolidation. No pleural effusion or pneumothorax. Heart and mediastinal contours are unremarkable. No acute osseous abnormality. IMPRESSION: No active disease. Electronically Signed   By: Kathreen Devoid M.D.   On: 08/18/2021 18:34   US LIVER DOPPLER  Result Date: 08/19/2021 CLINICAL DATA:  Cirrhosis EXAM: DUPLEX ULTRASOUND OF LIVER TECHNIQUE: Color and duplex Doppler ultrasound was performed to evaluate the hepatic in-flow and out-flow vessels. COMPARISON:  MRI abdomen 08/11/2021 FINDINGS: Liver: Cirrhotic liver morphology. 2.9 cm echogenic mass noted in the right liver lobe corresponds to the ablated lesion seen on prior MRI. Main Portal Vein size: 1.5 cm Portal Vein Velocities Main Prox:  15 cm/sec Main Mid: 10 cm/sec Main Dist:  16 cm/sec Right: 9 cm/sec Left: 13 cm/sec Hepatic Vein Velocities Right:  15 cm/sec Middle:  28 cm/sec Left:  19 cm/sec IVC: Present and patent with normal respiratory phasicity. Hepatic Artery Velocity:  72 cm/sec Splenic Vein  Velocity:  20 cm/sec Spleen: 13 cm x 9 cm x 12 cm with a total volume of 755 cm^3 (411 cm^3 is upper limit normal) Portal Vein Occlusion/Thrombus: No Splenic Vein Occlusion/Thrombus: No Ascites: Mild abdominal ascites. Varices: None Incidental note made of left renal cyst measuring 6.6 cm and the left pleural effusion. IMPRESSION: 1. Patent portal vein with appropriate direction of flow. 2. Splenomegaly 3. Mild ascites 4. Cirrhotic liver morphology Electronically Signed   By: Miachel Roux M.D.    On: 08/19/2021 08:18      Assessment & Recommendations  81 y.o. 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  Decompensated NASH cirrhosis with portal hypertension/refractory ascites --nothing new to add for this issue today.  Would not recommend inpatient TIPS for reasons outlined yesterday.  We can further discuss this as an outpatient but both she and family would need to understand, which I think they do, but if TIPS were performed it would be palliative only and carry significant risk for hepatic encephalopathy and possibly further hepatic decompensation and death.  It may not be "worth" trading 1 problem (ascites) for another (hepatic encephalopathy). --Low-sodium diet --Holding Lasix; renal function better today with creatinine 2.87; difficult to know what her true baseline is but much closer to 2.0 --Repeat large-volume paracentesis with IV albumin when needed; perhaps before discharge; not needed today --Anticipate palliative care discussions in the near future  2.  COVID-19 --on molnupiravir BID and not worsening --per hospitalist medicine  3. CKD -- as per #1, holding lasix for now  4.  Nausea/diarrea --symptomatic treatment with Zofran as needed and loperamide as needed.  Previously C. difficile negative during recent hospitalization.  Suspicion for infectious diarrhea that is very very low.  GI will follow peripherally but remain available.  We will arrange office follow-up with Dr. Henrene Pastor or APP shortly after discharge.    LOS: 2 days   Jerene Bears  08/20/2021, 10:53 AM See Shea Evans, [facility], to contact our  on call provider

## 2021-08-20 NOTE — Progress Notes (Signed)
[facility]  PROGRESS NOTE  Cheryl Jordan XNA:[id number] DOB: [birth year] DOA: 08/18/2021 PCP: Wannetta Sender, FNP   Brief HPI:   81 year old female with medical history of diabetes mellitus type 2, liver cirrhosis, adenocarcinoma of the colon, recurrent ascites, portal hypertensive gastropathy, esophageal varices, hypothyroidism, hypertension came to hospital with poor p.o. intake, nausea and diarrhea.  She was just discharged from the hospital on 08/11/2021 after she was admitted for acute kidney injury on CKD.  There was discussion regarding performing TIPS procedure as outpatient.  Gastroenterology was consulted  In the hospital patient was found to have COVID-19 infection.  Not requiring oxygen.  Subjective   Patient seen and examined, feeling better today.  Denies shortness of breath.  Cough has improved.  Renal function slowly improving.  Her appetite has also improved.  Ate one third of her breakfast today.   Assessment/Plan:    COVID-19 infection, present on admission -Presented with cough, poor appetite, diarrhea, no shortness of breath -SARS COVID 2 RT-PCR positive -Patient is not hypoxic, not requiring oxygen, chest x-ray shows no infiltrate -Started on  molnupiravir to prevent worsening of COVID-19 infection considering immunocompromised status with multiple comorbid conditions -Check CRP 7.8, D-dimer 3.72  End-stage liver disease -patient has Nash cirrhosis, MELD score 24.   -GI was consulted for possibility of TIPS procedure; however GI has recommended against TIPS procedure due to possibility of worsening encephalopathy -Consider palliative care consult in future  Recurrent ascites -Secondary to [name] cirrhosis -Last paracentesis was on 08/11/2021 -She has ascites but not symptomatic -Paracentesis as needed; will hold paracentesis at this time -Will likely need paracentesis before discharge  Acute kidney injury on CKD stage IIIb -Presented with  creatinine of 3.36 -Creatinine is down to 2.87 -Continue D5 normal saline at 50 mill per hour  Diabetes mellitus type 2 -Continue sliding scale insulin NovoLog -CBG well controlled  Iron-deficiency anemia due to chronic blood loss -Underwent EGD last week which showed mild gastritis treated with APC -Hemoglobin today is 9.7  Hypothyroidism -Continue Synthroid   Medications     insulin aspart  0-9 Units Subcutaneous TID WC   levothyroxine  88 mcg Oral Daily   molnupiravir EUA  4 capsule Oral BID   pantoprazole  40 mg Oral Q0600     Data Reviewed:   CBG:  Recent Labs  Lab 08/19/21 1637 08/20/21 0036 08/20/21 0456 08/20/21 0808 08/20/21 1125  GLUCAP 116* 137* 108* 117* 145*    SpO2: 96 %    Vitals:   08/19/21 2116 08/20/21 0039 08/20/21 0459 08/20/21 0955  BP: 133/62 114/61 132/65 132/63  Pulse: 97 91 81 99  Resp: 18 18 16 16   Temp: 97.7 F (36.5 C) 97.6 F (36.4 C) 97.8 F (36.6 C) 98 F (36.7 C)  TempSrc: Axillary Oral Oral Oral  SpO2: 97% 98% 95% 96%  Weight:      Height:          Data Reviewed:  Basic Metabolic Panel: Recent Labs  Lab 08/18/21 1527 08/19/21 0535 08/20/21 0454  NA 135 133* 136  K 4.7 4.4 3.9  CL 108 106 110  CO2 18* 18* 18*  GLUCOSE 105* 108* 108*  BUN 58* 52* 50*  CREATININE 3.36* 3.06* 2.87*  CALCIUM 8.4* 8.0* 8.1*    CBC: Recent Labs  Lab 08/18/21 1527 08/19/21 0535 08/20/21 0454  WBC 4.8 4.0 4.1  NEUTROABS 3.5  --   --   HGB 10.2* 9.7* 8.9*  HCT 32.1* 31.1*  28.4*  MCV 91.2 91.7 91.0  PLT 111* 117* 110*       Antibiotics: Anti-infectives (From admission, onward)    Start     Dose/Rate Route Frequency Ordered Stop   08/19/21 1300  molnupiravir EUA (LAGEVRIO) capsule 800 mg        4 capsule Oral 2 times daily 08/19/21 1217 08/24/21 0959        DVT prophylaxis: SCDs  Code Status: Full code  Family Communication: No family at bedside      Objective    Physical  Examination:   General-appears in no acute distress Heart-S1-S2, regular, no murmur auscultated Lungs-clear to auscultation bilaterally, no wheezing or crackles auscultated Abdomen-soft, nontender, no organomegaly Extremities-no edema in the lower extremities Neuro-alert, oriented x3, no focal deficit noted  Status is: Inpatient for COVID-19 infection         [place]   [facility] If 7PM-7AM, please contact night-coverage at [email or web address], Office  [phone]   08/20/2021, 11:32 AM  LOS: 2 days

## 2021-08-21 ENCOUNTER — Inpatient Hospital Stay (HOSPITAL_COMMUNITY): Payer: Medicare Other

## 2021-08-21 DIAGNOSIS — R627 Adult failure to thrive: Secondary | ICD-10-CM | POA: Diagnosis not present

## 2021-08-21 DIAGNOSIS — R531 Weakness: Secondary | ICD-10-CM | POA: Diagnosis not present

## 2021-08-21 DIAGNOSIS — U071 COVID-19: Secondary | ICD-10-CM | POA: Diagnosis not present

## 2021-08-21 DIAGNOSIS — K7460 Unspecified cirrhosis of liver: Secondary | ICD-10-CM

## 2021-08-21 DIAGNOSIS — R188 Other ascites: Secondary | ICD-10-CM | POA: Diagnosis not present

## 2021-08-21 LAB — GLUCOSE, CAPILLARY
Glucose-Capillary: 113 mg/dL — ABNORMAL HIGH (ref 70–99)
Glucose-Capillary: 116 mg/dL — ABNORMAL HIGH (ref 70–99)
Glucose-Capillary: 170 mg/dL — ABNORMAL HIGH (ref 70–99)
Glucose-Capillary: 106 mg/dL — ABNORMAL HIGH (ref 70–99)
Glucose-Capillary: 98 mg/dL (ref 70–99)

## 2021-08-21 LAB — BASIC METABOLIC PANEL
Anion gap: 7 (ref 5–15)
BUN: 45 mg/dL — ABNORMAL HIGH (ref 8–23)
CO2: 18 mmol/L — ABNORMAL LOW (ref 22–32)
Calcium: 8.2 mg/dL — ABNORMAL LOW (ref 8.9–10.3)
Chloride: 114 mmol/L — ABNORMAL HIGH (ref 98–111)
Creatinine, Ser: 2.65 mg/dL — ABNORMAL HIGH (ref 0.44–1.00)
GFR, Estimated: 18 mL/min — ABNORMAL LOW (ref >60)
Glucose, Bld: 113 mg/dL — ABNORMAL HIGH (ref 70–99)
Potassium: 4 mmol/L (ref 3.5–5.1)
Sodium: 139 mmol/L (ref 135–145)

## 2021-08-21 LAB — D-DIMER, QUANTITATIVE: D-Dimer, Quant: 3.82 ug/mL-FEU — ABNORMAL HIGH (ref 0.00–0.50)

## 2021-08-21 LAB — C-REACTIVE PROTEIN: CRP: 5.9 mg/dL — ABNORMAL HIGH (ref <1.0)

## 2021-08-21 MED ORDER — ADULT MULTIVITAMIN W/MINERALS CH
1.0000 | ORAL_TABLET | Freq: Every day | ORAL | Status: DC
  Administered 2021-08-21 – 2021-08-24 (×4): 1 via ORAL
  Filled 2021-08-21 (×4): qty 1

## 2021-08-21 MED ORDER — LIDOCAINE HCL 1 % IJ SOLN
INTRAMUSCULAR | Status: AC
  Administered 2021-08-21: 10 mL
  Filled 2021-08-21: qty 20

## 2021-08-21 MED ORDER — ALBUMIN HUMAN 25 % IV SOLN
12.5000 g | Freq: Once | INTRAVENOUS | Status: AC
  Administered 2021-08-21: 12.5 g via INTRAVENOUS
  Filled 2021-08-21: qty 50

## 2021-08-21 MED ORDER — BOOST PLUS PO LIQD
237.0000 mL | Freq: Three times a day (TID) | ORAL | Status: DC
  Filled 2021-08-21 (×11): qty 237

## 2021-08-21 NOTE — Progress Notes (Signed)
°  Transition of Care [facility]) Screening Note   Patient Details  Name: Cheryl Jordan Date of Birth: 04-Oct-1940   Transition of Care [facility]) CM/SW Contact:    Lennart Pall, LCSW Phone Number: 08/21/2021, 11:03 AM    Transition of Care Department [facility]) has reviewed patient and no TOC needs have been identified at this time. We will continue to monitor patient advancement through interdisciplinary progression rounds. If new patient transition needs arise, please place a TOC consult.

## 2021-08-21 NOTE — Progress Notes (Signed)
[facility]  PROGRESS NOTE  Cheryl Jordan IEP:[id number] DOB: 14-Oct-1940 DOA: 08/18/2021 PCP: Wannetta Sender, FNP   Brief HPI:   81 year old female with medical history of diabetes mellitus type 2, liver cirrhosis, adenocarcinoma of the colon, recurrent ascites, portal hypertensive gastropathy, esophageal varices, hypothyroidism, hypertension came to hospital with poor p.o. intake, nausea and diarrhea.  She was just discharged from the hospital on 08/11/2021 after she was admitted for acute kidney injury on CKD.  There was discussion regarding performing TIPS procedure as outpatient.  Gastroenterology was consulted  In the hospital patient was found to have COVID-19 infection.  Not requiring oxygen.  Subjective   Patient seen and examined, feels tired this morning.  Also has been coughing.  Denies any phlegm.   Assessment/Plan:    COVID-19 infection, present on admission -Presented with cough, poor appetite, diarrhea, no shortness of breath -SARS COVID 2 RT-PCR positive -Patient is not hypoxic, not requiring oxygen, chest x-ray shows no infiltrate -Started on  molnupiravir to prevent worsening of COVID-19 infection considering immunocompromised status with multiple comorbid conditions -Check CRP is down to 5.9, D-dimer 3.72  End-stage liver disease -patient has [name] cirrhosis, MELD score 24.   -GI was consulted for possibility of TIPS procedure; however GI has recommended against TIPS procedure due to possibility of worsening encephalopathy -Consider palliative care consult in future  Recurrent ascites -Secondary to [name] cirrhosis -Last paracentesis was on 08/11/2021 -She has worsening of ascites today -We will order IR consult for paracentesis  Acute kidney injury on CKD stage IIIb -Presented with creatinine of 3.36 -Creatinine is down to 2.65   Diabetes mellitus type 2 -Continue sliding scale insulin NovoLog -CBG well controlled  Iron-deficiency anemia due to  chronic blood loss -Underwent EGD last week which showed mild gastritis treated with APC -Hemoglobin today is 8.9  Hypothyroidism -Continue Synthroid   Medications     insulin aspart  0-9 Units Subcutaneous TID WC   lactose free nutrition  237 mL Oral TID WC   levothyroxine  88 mcg Oral Daily   molnupiravir EUA  4 capsule Oral BID   multivitamin with minerals  1 tablet Oral Daily   pantoprazole  40 mg Oral Q0600     Data Reviewed:   CBG:  Recent Labs  Lab 08/20/21 1630 08/20/21 2153 08/21/21 0257 08/21/21 0730 08/21/21 1115  GLUCAP 114* 119* 113* 116* 170*    SpO2: 95 %    Vitals:   08/20/21 1450 08/20/21 2155 08/21/21 0629 08/21/21 1233  BP: 116/60 (!) 113/50 125/73 127/66  Pulse: 84 86 94 97  Resp: 16 16 18 18   Temp: 98.1 F (36.7 C) 98.3 F (36.8 C) 98.2 F (36.8 C) 98.1 F (36.7 C)  TempSrc: Oral Oral Oral Oral  SpO2: 99% 96% 95% 95%  Weight:      Height:          Data Reviewed:  Basic Metabolic Panel: Recent Labs  Lab 08/18/21 1527 08/19/21 0535 08/20/21 0454 08/21/21 0416  NA 135 133* 136 139  K 4.7 4.4 3.9 4.0  CL 108 106 110 114*  CO2 18* 18* 18* 18*  GLUCOSE 105* 108* 108* 113*  BUN 58* 52* 50* 45*  CREATININE 3.36* 3.06* 2.87* 2.65*  CALCIUM 8.4* 8.0* 8.1* 8.2*    CBC: Recent Labs  Lab 08/18/21 1527 08/19/21 0535 08/20/21 0454  WBC 4.8 4.0 4.1  NEUTROABS 3.5  --   --   HGB 10.2* 9.7* 8.9*  HCT 32.1*  31.1* 28.4*  MCV 91.2 91.7 91.0  PLT 111* 117* 110*       Antibiotics: Anti-infectives (From admission, onward)    Start     Dose/Rate Route Frequency Ordered Stop   08/19/21 1300  molnupiravir EUA (LAGEVRIO) capsule 800 mg        4 capsule Oral 2 times daily 08/19/21 1217 08/24/21 0959        DVT prophylaxis: SCDs  Code Status: Full code  Family Communication: No family at bedside    Objective    Physical Examination:   General-appears in no acute distress Heart-S1-S2, regular, no murmur  auscultated Lungs-clear to auscultation bilaterally, no wheezing or crackles auscultated Abdomen-soft, nontender, distended, no organomegaly Extremities-no edema in the lower extremities Neuro-alert, oriented x3, no focal deficit noted  Status is: Inpatient for COVID-19 infection       [place]   [facility] If 7PM-7AM, please contact night-coverage at [email or web address], Office  [phone]   08/21/2021, 1:54 PM  LOS: 3 days

## 2021-08-21 NOTE — Procedures (Signed)
PROCEDURE SUMMARY:  Successful US guided paracentesis from left abdomen.  Yielded 6L of clear yellow fluid.  No immediate complications.  Pt tolerated well.   EBL < 2 mL  Theresa Duty, NP 08/21/2021 3:15 PM

## 2021-08-21 NOTE — Progress Notes (Signed)
Upon assessment patient had no IV, attempted to place a new IV in R [facility] but patient stated she didn't want IV at the bend of her arm. Order for IV team was placed.

## 2021-08-21 NOTE — Progress Notes (Signed)
Initial Nutrition Assessment  DOCUMENTATION CODES:   Severe malnutrition in context of chronic illness  INTERVENTION:   -Needs updated weight for admission (exact same weight recorded as on 1/25)  -Boost Plus chocolate- Each supplement provides 360kcal and 14g protein.     -Multivitamin with minerals daily  NUTRITION DIAGNOSIS:   Severe Malnutrition related to chronic illness (colon cancer, cirrhosis) as evidenced by percent weight loss, energy intake < or equal to 75% for > or equal to 1 month, severe muscle depletion, moderate fat depletion.  GOAL:   Patient will meet greater than or equal to 90% of their needs  MONITOR:   PO intake, Supplement acceptance, Labs, Weight trends, Skin, I & O's  REASON FOR ASSESSMENT:   Malnutrition Screening Tool    ASSESSMENT:   81 year old female with medical history of diabetes mellitus type 2, liver cirrhosis, adenocarcinoma of the colon, recurrent ascites, portal hypertensive gastropathy, esophageal varices, hypothyroidism, hypertension came to hospital with poor p.o. intake, nausea and diarrhea.  She was just discharged from the hospital on 08/11/2021 after she was admitted for acute kidney injury on CKD. Admitted for persistent nausea and poor PO intakes.  Patient continues to have nausea, received Zofran this morning. Pt now COVID+. Unable to keep anything down at home. Recently discharged from hospital 1/27 for AKI r/t N/V/D. Per chart review, pt will require paracentesis. Last 1/17.  Currently only consuming 0-25% of meals, very poor appetite.  Will order Boost Plus supplements as this is what pt has consumed in the past.   Per weight records, pt has lost 28 lbs since 02/02/21 (13% wt loss x 6.5 months, significant for time frame).   Medications: Zofran  Labs reviewed: CBGs: 108-145   NUTRITION - FOCUSED PHYSICAL EXAM:  Flowsheet Row Most Recent Value  Orbital Region No depletion  Upper Arm Region Moderate depletion   Thoracic and Lumbar Region Moderate depletion  Buccal Region Moderate depletion  Temple Region Moderate depletion  Clavicle Bone Region Severe depletion  Clavicle and Acromion Bone Region Severe depletion  Scapular Bone Region Severe depletion  Dorsal Hand Severe depletion  Patellar Region Mild depletion  Anterior Thigh Region Mild depletion  Posterior Calf Region Mild depletion  Edema (RD Assessment) None  Hair Reviewed  Eyes Reviewed  Mouth Reviewed  [uses partial]  Skin Reviewed  Nails Reviewed       Diet Order:   Diet Order             Diet 2 gram sodium Room service appropriate? Yes; Fluid consistency: Thin  Diet effective now                   EDUCATION NEEDS:   No education needs have been identified at this time  Skin:  Skin Assessment: Reviewed RN Assessment  Last BM:  2/5 -type 5-6  Height:   Ht Readings from Last 1 Encounters:  08/19/21 5\' 7"  (1.702 m)    Weight:   Wt Readings from Last 1 Encounters:  08/18/21 84.4 kg    BMI:  Body mass index is 29.14 kg/m.  Estimated Nutritional Needs:   Kcal:  1800-2000  Protein:  85-100g  Fluid:  2L/day   Clayton Bibles, MS, RD, LDN Inpatient Clinical Dietitian Contact information available via Amion

## 2021-08-21 NOTE — Progress Notes (Addendum)
[facility] Progress Note  CC: Cirrhosis, ascites   Subjective: Mild nausea without vomiting. She passed a nonbloody stool this morning.  No abdominal pain. No CP or SOB.    Objective:  Vital signs in last 24 hours: Temp:  [98 F (36.7 C)-98.3 F (36.8 C)] 98.2 F (36.8 C) (02/06 0629) Pulse Rate:  [84-99] 94 (02/06 0629) Resp:  [16-18] 18 (02/06 0629) BP: (113-132)/(50-73) 125/73 (02/06 0629) SpO2:  [95 %-99 %] 95 % (02/06 0629) Last BM Date: 08/20/21 General:   Alert 81 year old female in no acute distress. Eyes: No scleral icterus. Heart: Regular rate and rhythm.  Systolic murmur. Pulm: Breath sounds clear throughout. Abdomen: Distended but not tense with ascites.  Nontender.  Positive bowel sounds to all 4 quadrants.  No palpable mass. Extremities: No lower extremity edema. Neurologic:  Alert and  oriented x 4. Grossly normal neurologically. Psych:  Alert and cooperative. Normal mood and affect.  Intake/Output from previous day: 02/05 0701 - 02/06 0700 In: 1847.4 [P.O.:660; I.V.:1187.4] Out: 300 [Urine:300] Intake/Output this shift: No intake/output data recorded.  Lab Results: Recent Labs    08/18/21 1527 08/19/21 0535 08/20/21 0454  WBC 4.8 4.0 4.1  HGB 10.2* 9.7* 8.9*  HCT 32.1* 31.1* 28.4*  PLT 111* 117* 110*   BMET Recent Labs    08/19/21 0535 08/20/21 0454 08/21/21 0416  NA 133* 136 139  K 4.4 3.9 4.0  CL 106 110 114*  CO2 18* 18* 18*  GLUCOSE 108* 108* 113*  BUN 52* 50* 45*  CREATININE 3.06* 2.87* 2.65*  CALCIUM 8.0* 8.1* 8.2*   LFT Recent Labs    08/19/21 0535  PROT 5.2*  ALBUMIN 2.5*  AST 26  ALT 12  ALKPHOS 79  BILITOT 1.1   PT/INR Recent Labs    08/19/21 0535  LABPROT 16.4*  INR 1.3*   Hepatitis Panel No results for input(s): HEPBSAG, HCVAB, HEPAIGM, HEPBIGM in the last 72 hours.  No results found.  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   MELD 24.  Liver Doppler 08/18/2021 showed a patent portal vein with splenomegaly, cirrhosis and mild ascites.  Last paracentesis was 08/11/2021, 3 L of peritoneal fluid was removed.  No evidence of SBP.  Echo 08/11/2021 showed LVEF 65 - 70%. Normal RV function. -TIPS not recommended at this juncture, per Dr. Hilarie Fredrickson TIPS for her does come with a significant risk of further hepatic decompensation including death.  She has not had hepatic encephalopathy but this would certainly be a high risk for her.  Her cardiac function does not appear to preclude TIPS, but there would be a risk of cardiac decompensation as well. -Continue to hold diuretics secondary to renal dysfunction -2 g low-sodium diet -Therapeutic paracentesis is needed, most likely will need a paracentesis in the next few days -Consider palliative care -Await further recommendations per Dr. Henrene Pastor  2)  COVID-19. On Molnupiravir BID.  Stable respiratory status. 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  No evidence for ongoing GI blood loss.   -Continue PPI -Continue oral iron indefinitely  3) CKD. Cr 2.87 -> 2.65  4) Normocytic anemia. Hg 9.7 -> 8.0 on 2/5.  EGD was performed by Dr. Candis Schatz 08/07/2021 revealed small varices without bleeding, moderate portal gastropathy, antral gastropathy versus GAVE treated with APC. -CBC in a.m.  5) Thrombocytopenia secondary to cirrhosis. PLT 110.  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  Patient seen and examined.  Agree with interval progress note.  Patient well-known to me.  Hospital course reviewed with Dr. Hilarie Fredrickson.  Unfortunately, not much to add from GI perspective.  Overall, care at  this point is palliative and symptomatic.  I agree that I would not pursue TIPS as discussed by Dr. Hilarie Fredrickson.  She will continue with as needed paracentesis.  GI follow-up about 4 to 6 weeks postdischarge is recommended.  Docia Chuck. Geri Seminole., M.D. [facility] Division of Gastroenterology

## 2021-08-21 NOTE — Plan of Care (Signed)
°  Problem: Coping: Goal: Psychosocial and spiritual needs will be supported Outcome: Progressing   Problem: Pain Managment: Goal: General experience of comfort will improve Outcome: Progressing   Problem: Safety: Goal: Ability to remain free from injury will improve Outcome: Progressing   

## 2021-08-22 ENCOUNTER — Telehealth: Payer: Self-pay | Admitting: Nurse Practitioner

## 2021-08-22 LAB — CBC
HCT: 28.7 % — ABNORMAL LOW (ref 36.0–46.0)
Hemoglobin: 8.9 g/dL — ABNORMAL LOW (ref 12.0–15.0)
MCH: 28.9 pg (ref 26.0–34.0)
MCHC: 31 g/dL (ref 30.0–36.0)
MCV: 93.2 fL (ref 80.0–100.0)
Platelets: 122 10*3/uL — ABNORMAL LOW (ref 150–400)
RBC: 3.08 MIL/uL — ABNORMAL LOW (ref 3.87–5.11)
RDW: 15.5 % (ref 11.5–15.5)
WBC: 4.5 10*3/uL (ref 4.0–10.5)
nRBC: 0 % (ref 0.0–0.2)

## 2021-08-22 LAB — GLUCOSE, CAPILLARY
Glucose-Capillary: 104 mg/dL — ABNORMAL HIGH (ref 70–99)
Glucose-Capillary: 104 mg/dL — ABNORMAL HIGH (ref 70–99)
Glucose-Capillary: 130 mg/dL — ABNORMAL HIGH (ref 70–99)
Glucose-Capillary: 159 mg/dL — ABNORMAL HIGH (ref 70–99)
Glucose-Capillary: 123 mg/dL — ABNORMAL HIGH (ref 70–99)

## 2021-08-22 LAB — COMPREHENSIVE METABOLIC PANEL
ALT: 11 U/L (ref 0–44)
AST: 25 U/L (ref 15–41)
Albumin: 2.3 g/dL — ABNORMAL LOW (ref 3.5–5.0)
Alkaline Phosphatase: 72 U/L (ref 38–126)
Anion gap: 5 (ref 5–15)
BUN: 46 mg/dL — ABNORMAL HIGH (ref 8–23)
CO2: 18 mmol/L — ABNORMAL LOW (ref 22–32)
Calcium: 8.2 mg/dL — ABNORMAL LOW (ref 8.9–10.3)
Chloride: 115 mmol/L — ABNORMAL HIGH (ref 98–111)
Creatinine, Ser: 2.72 mg/dL — ABNORMAL HIGH (ref 0.44–1.00)
GFR, Estimated: 17 mL/min — ABNORMAL LOW (ref >60)
Glucose, Bld: 102 mg/dL — ABNORMAL HIGH (ref 70–99)
Potassium: 4.2 mmol/L (ref 3.5–5.1)
Sodium: 138 mmol/L (ref 135–145)
Total Bilirubin: 0.9 mg/dL (ref 0.3–1.2)
Total Protein: 4.7 g/dL — ABNORMAL LOW (ref 6.5–8.1)

## 2021-08-22 MED ORDER — SODIUM BICARBONATE 650 MG PO TABS
650.0000 mg | ORAL_TABLET | Freq: Two times a day (BID) | ORAL | Status: AC
  Administered 2021-08-22 – 2021-08-23 (×4): 650 mg via ORAL
  Filled 2021-08-22 (×4): qty 1

## 2021-08-22 NOTE — Progress Notes (Signed)
°   08/22/21 1100  Oxygen Therapy  SpO2 98 %  O2 Device Room Air  Mobility  Activity Ambulated with assistance in hallway  Level of Assistance Standby assist, set-up cues, supervision of patient - no hands on  Assistive Device Front wheel walker  Distance Ambulated (ft) 90 ft  Activity Response Tolerated well  $Mobility charge 1 Mobility   Pt agreeable to mobilize this morning. Ambulated about 36ft with RW, tolerated well. About half way, pt noted she was beginning to feel tired. Otherwise no complaints. Left pt in bed with call bell at side. RN notified of session.  [place] [facility]ist Acute Rehab Services Office: [phone]

## 2021-08-22 NOTE — Telephone Encounter (Signed)
Cheryl Jordan, patient is currently admitted to [facility]. Hopefully she will be discharged home within the next few days. Pls contact her and schedule her for a follow up appointment with me or Dr. Henrene Pastor in 4 to 6 weeks. Send her to the lab for a CBC, CMP and INR one week post hospital discharged. THX.

## 2021-08-22 NOTE — Progress Notes (Addendum)
[facility] Progress Note  CC:  Decompensated cirrhosis   Subjective: She walked in the hallway today, felt fatigued afterwards. No N/V. No abdominal pain. Decreased appetite. Ate eggs and fruit cocktail earlier today. No BM today. Abdomen less distended following paracentesis yesterday.    Objective:  Vital signs in last 24 hours: Temp:  [97.6 F (36.4 C)-98.3 F (36.8 C)] 97.6 F (36.4 C) (02/07 0529) Pulse Rate:  [78-95] 95 (02/07 0529) Resp:  [16-20] 16 (02/07 0529) BP: (107-128)/(52-59) 107/59 (02/07 0529) SpO2:  [97 %-100 %] 98 % (02/07 1100) Last BM Date: 08/21/21 General:   Alert 81 year old female in NAD. Heart: RRR, no murmur.  Pulm: Breath sounds clear throughout.  Abdomen: Less distended today, mild ascites. Nontender. + BS x 4 quads.  Extremities:  Without edema. Neurologic:  Alert and  oriented x 4. Speech is clear. Moves all extremities. Answers questions appropriately.  Psych:  Alert and cooperative. Normal mood and affect.  Intake/Output from previous day: 02/06 0701 - 02/07 0700 In: 408.6 [P.O.:360; IV Piggyback:48.6] Out: 0  Intake/Output this shift: Total I/O In: 240 [P.O.:240] Out: 0   Lab Results: Recent Labs    08/20/21 0454 08/22/21 0446  WBC 4.1 4.5  HGB 8.9* 8.9*  HCT 28.4* 28.7*  PLT 110* 122*   BMET Recent Labs    08/20/21 0454 08/21/21 0416 08/22/21 0446  NA 136 139 138  K 3.9 4.0 4.2  CL 110 114* 115*  CO2 18* 18* 18*  GLUCOSE 108* 113* 102*  BUN 50* 45* 46*  CREATININE 2.87* 2.65* 2.72*  CALCIUM 8.1* 8.2* 8.2*   LFT Recent Labs    08/22/21 0446  PROT 4.7*  ALBUMIN 2.3*  AST 25  ALT 11  ALKPHOS 72  BILITOT 0.9   PT/INR No results for input(s): LABPROT, INR in the last 72 hours. Hepatitis Panel No results for input(s): HEPBSAG, HCVAB, HEPAIGM, HEPBIGM in the last 72 hours.  US Paracentesis  Result Date: 08/21/2021 INDICATION: Patient with a history of cirrhosis, hepatocellular carcinoma and  recurrent large volume ascites presents today for therapeutic paracentesis with 6 L max. EXAM: ULTRASOUND GUIDED PARACENTESIS MEDICATIONS: 1% lidocaine 10 mL COMPLICATIONS: None immediate. PROCEDURE: Informed written consent was obtained from the patient after a discussion of the risks, benefits and alternatives to treatment. A timeout was performed prior to the initiation of the procedure. Initial ultrasound scanning demonstrates a large amount of ascites within the left lower abdominal quadrant. The left lower abdomen was prepped and draped in the usual sterile fashion. 1% lidocaine was used for local anesthesia. Following this, a 19 gauge, 7-cm, Yueh catheter was introduced. An ultrasound image was saved for documentation purposes. The paracentesis was performed. The catheter was removed and a dressing was applied. The patient tolerated the procedure well without immediate post procedural complication. Patient received post-procedure intravenous albumin; see nursing notes for details. FINDINGS: A total of approximately 6 L of clear yellow fluid was removed. IMPRESSION: Successful ultrasound-guided paracentesis yielding 6 liters of peritoneal fluid. Read by: Soyla Dryer, NP Electronically Signed   By: Jerilynn Mages.  Shick M.D.   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  MELD 24.  Normal LFTs. Liver Doppler 08/18/2021 showed a patent portal vein with splenomegaly, cirrhosis and mild ascites. Echo 08/11/2021 showed LVEF 65 - 70%. Normal RV function. S/P paracentesis 2/6, 6L of peritoneal fluid was removed. Received IV albumin post paracentesis. Peritoneal  fluid labs not collected.  No history of SBP or hepatic encephalopathy. -TIPS not recommended at this juncture, per Dr. Hilarie Fredrickson TIPS for her does come with a significant risk of further hepatic decompensation including death.  She has not had hepatic encephalopathy but this would certainly be  a high risk for her.  Her cardiac function does not appear to preclude TIPS, but there would be a risk of cardiac decompensation as well. -Continue to hold diuretics secondary to renal dysfunction -2 g low-sodium diet, Boost as tolerated  -Therapeutic paracentesis as needed, check cell count with diff, gram stain and aerobic/anaerobic cultures with next paracentesis -BMP and INR in am -Consider palliative care -Await further recommendations per Dr. Henrene Pastor   2)  COVID-19. On Molnupiravir BID.  Stable respiratory status. On room air.    3) CKD. Cr 2.87 -> 2.65 -> 2.72.   4) Normocytic anemia. Hg 9.7 -> 8.0 -> 8.9. EGD was performed by Dr. Candis Schatz 08/07/2021 revealed small varices without bleeding, moderate portal gastropathy, antral gastropathy versus GAVE treated with APC. -CBC in a.m. -Continue oral iron indefinitely    5) Thrombocytopenia secondary to cirrhosis. PLT 110 -> 122.   6) History of colon cancer 06/2020  7) History of DVT no longer on anticoagulation therapy   8) DM II   Principal Problem:   Failure to thrive in adult Active Problems:   COVID-19 virus infection   Abdominal ascites   Cirrhosis ([place])     LOS: 4 days   Cheryl Jordan  08/22/2021, 2:02PM  GI ATTENDING  Interval history and data reviewed.  Patient seen and examined as outlined above.  Agree with interval progress note.  Stable from GI standpoint.  Nothing to add in terms of liver disease management.  Blood counts stable.  Renal function stable.  Diet to be 2 g sodium.  We will continue with outpatient as needed paracentesis.  Plan for outpatient GI office evaluation in about 4 to 6 weeks discharge.  Please reach out for any questions or problems.  Thanks.  Docia Chuck. Geri Seminole., M.D. [facility] Division of Gastroenterology

## 2021-08-22 NOTE — Progress Notes (Signed)
[facility]  PROGRESS NOTE  Cheryl Jordan TKP:[id number] DOB: 1941/01/30 DOA: 08/18/2021 PCP: Wannetta Sender, FNP   Brief HPI:   81 year old female with medical history of diabetes mellitus type 2, liver cirrhosis, adenocarcinoma of the colon, recurrent ascites, portal hypertensive gastropathy, esophageal varices, hypothyroidism, hypertension came to hospital with poor p.o. intake, nausea and diarrhea.  She was just discharged from the hospital on 08/11/2021 after she was admitted for acute kidney injury on CKD.  There was discussion regarding performing TIPS procedure as outpatient.  Gastroenterology was consulted  In the hospital patient was found to have COVID-19 infection.  Not requiring oxygen.  Subjective   Patient seen and examined, feels less fatigued this morning.  S/p paracentesis with 6 L of fluid removed yesterday.   Assessment/Plan:    COVID-19 infection, present on admission -Presented with cough, poor appetite, diarrhea, no shortness of breath -SARS COVID 2 RT-PCR positive -Patient is not hypoxic, not requiring oxygen, chest x-ray shows no infiltrate -Started on  molnupiravir to prevent worsening of COVID-19 infection considering immunocompromised status with multiple comorbid conditions -Check CRP is down to 5.9, D-dimer 3.72  End-stage liver disease -patient has [name] cirrhosis, MELD score 24.   -GI was consulted for possibility of TIPS procedure; however GI has recommended against TIPS procedure due to possibility of worsening encephalopathy -We will consult palliative care  Recurrent ascites -Secondary to [name] cirrhosis -Last paracentesis was on 08/11/2021 -She had worsening of ascites  yesterday -Underwent paracentesis with removal of 6 L of peritoneal fluid yesterday -12.5 g  of 25% albumin given x1  Hyperchloremic metabolic acidosis -We will start sodium bicarb tablets  Acute kidney injury on CKD stage IIIb -Presented with creatinine of  3.36 -Creatinine is down to 2.72  Diabetes mellitus type 2 -Continue sliding scale insulin NovoLog -CBG well controlled  Iron-deficiency anemia due to chronic blood loss -Underwent EGD last week which showed mild gastritis treated with APC -Hemoglobin today is 8.9  Hypothyroidism -Continue Synthroid   Medications     insulin aspart  0-9 Units Subcutaneous TID WC   lactose free nutrition  237 mL Oral TID WC   levothyroxine  88 mcg Oral Daily   molnupiravir EUA  4 capsule Oral BID   multivitamin with minerals  1 tablet Oral Daily   pantoprazole  40 mg Oral Q0600     Data Reviewed:   CBG:  Recent Labs  Lab 08/21/21 1629 08/21/21 2223 08/22/21 0324 08/22/21 0831 08/22/21 1149  GLUCAP 106* 98 104* 104* 130*    SpO2: 98 %    Vitals:   08/21/21 1456 08/21/21 2006 08/22/21 0529 08/22/21 1100  BP: (!) 125/53 (!) 128/52 (!) 107/59   Pulse: 86 78 95   Resp: 20 18 16    Temp: 98.3 F (36.8 C) 98.1 F (36.7 C) 97.6 F (36.4 C)   TempSrc: Oral Oral Oral   SpO2: 97% 100% 97% 98%  Weight:      Height:          Data Reviewed:  Basic Metabolic Panel: Recent Labs  Lab 08/18/21 1527 08/19/21 0535 08/20/21 0454 08/21/21 0416 08/22/21 0446  NA 135 133* 136 139 138  K 4.7 4.4 3.9 4.0 4.2  CL 108 106 110 114* 115*  CO2 18* 18* 18* 18* 18*  GLUCOSE 105* 108* 108* 113* 102*  BUN 58* 52* 50* 45* 46*  CREATININE 3.36* 3.06* 2.87* 2.65* 2.72*  CALCIUM 8.4* 8.0* 8.1* 8.2* 8.2*    CBC:  Recent Labs  Lab 08/18/21 1527 08/19/21 0535 08/20/21 0454 08/22/21 0446  WBC 4.8 4.0 4.1 4.5  NEUTROABS 3.5  --   --   --   HGB 10.2* 9.7* 8.9* 8.9*  HCT 32.1* 31.1* 28.4* 28.7*  MCV 91.2 91.7 91.0 93.2  PLT 111* 117* 110* 122*       Antibiotics: Anti-infectives (From admission, onward)    Start     Dose/Rate Route Frequency Ordered Stop   08/19/21 1300  molnupiravir EUA (LAGEVRIO) capsule 800 mg        4 capsule Oral 2 times daily 08/19/21 1217 08/24/21 0959         DVT prophylaxis: SCDs  Code Status: Full code  Family Communication: No family at bedside    Objective    Physical Examination:   General-appears in no acute distress Heart-S1-S2, regular, no murmur auscultated Lungs-clear to auscultation bilaterally, no wheezing or crackles auscultated Abdomen-soft, nontender, no organomegaly Extremities-no edema in the lower extremities Neuro-alert, oriented x3, no focal deficit noted Status is: Inpatient for COVID-19 infection       [place]   [facility] If 7PM-7AM, please contact night-coverage at [email or web address], Office  [phone]   08/22/2021, 2:15 PM  LOS: 4 days

## 2021-08-23 DIAGNOSIS — C189 Malignant neoplasm of colon, unspecified: Secondary | ICD-10-CM

## 2021-08-23 DIAGNOSIS — N1832 Chronic kidney disease, stage 3b: Secondary | ICD-10-CM

## 2021-08-23 DIAGNOSIS — Z7189 Other specified counseling: Secondary | ICD-10-CM

## 2021-08-23 DIAGNOSIS — R180 Malignant ascites: Secondary | ICD-10-CM

## 2021-08-23 DIAGNOSIS — N170 Acute kidney failure with tubular necrosis: Secondary | ICD-10-CM

## 2021-08-23 DIAGNOSIS — Z515 Encounter for palliative care: Secondary | ICD-10-CM

## 2021-08-23 LAB — BASIC METABOLIC PANEL
Anion gap: 5 (ref 5–15)
BUN: 48 mg/dL — ABNORMAL HIGH (ref 8–23)
CO2: 18 mmol/L — ABNORMAL LOW (ref 22–32)
Calcium: 8.2 mg/dL — ABNORMAL LOW (ref 8.9–10.3)
Chloride: 114 mmol/L — ABNORMAL HIGH (ref 98–111)
Creatinine, Ser: 2.87 mg/dL — ABNORMAL HIGH (ref 0.44–1.00)
GFR, Estimated: 16 mL/min — ABNORMAL LOW (ref >60)
Glucose, Bld: 95 mg/dL (ref 70–99)
Potassium: 4.4 mmol/L (ref 3.5–5.1)
Sodium: 137 mmol/L (ref 135–145)

## 2021-08-23 LAB — GLUCOSE, CAPILLARY
Glucose-Capillary: 93 mg/dL (ref 70–99)
Glucose-Capillary: 107 mg/dL — ABNORMAL HIGH (ref 70–99)
Glucose-Capillary: 131 mg/dL — ABNORMAL HIGH (ref 70–99)
Glucose-Capillary: 92 mg/dL (ref 70–99)
Glucose-Capillary: 111 mg/dL — ABNORMAL HIGH (ref 70–99)

## 2021-08-23 NOTE — Assessment & Plan Note (Addendum)
-   Presented with a cough, poor appetite, diarrhea but no dyspnea.  COVID-19 + -Currently no hypoxia or requiring O2 supplementation.  Chest x-ray showed no infiltrates. -Completed molnupiravir course for 5 days

## 2021-08-23 NOTE — Assessment & Plan Note (Signed)
-   CBGs currently stable

## 2021-08-23 NOTE — Consult Note (Signed)
Consultation Note Date: 08/23/2021   Patient Name: Cheryl Jordan  DOB: Apr 14, 1941  MRN: [id number]  Age / Sex: 81 y.o., female  PCP: Wannetta Sender, FNP Referring Physician: Mendel Corning, MD  Reason for Consultation: Establishing goals of care  HPI/Patient Profile: 81 y.o. female  with past medical history of diabetes mellitus type 2, liver cirrhosis, adenocarcinoma of the colon, recurrent ascites, portal hypertensive gastropathy, esophageal varices, hypothyroidism, and hypertension admitted on 08/18/2021 with poor p.o. intake, nausea and diarrhea. Recently discharged from hospital 1/27 after admission for AKI. This admission, found to be positive for COVID-19 - not requiring oxygen. Patient with MELD score of 24 and requires paracentesis ~ q 8-10 days. GI was consulted for possible TIPS procedure however this was recommended against d/t risk for hepatic encephalopathy. PMT consulted to discuss Angie.    Clinical Assessment and Goals of Care: I have reviewed medical records including EPIC notes, labs and imaging,  assessed the patient and then met with patient  to discuss diagnosis prognosis, GOC, EOL wishes, disposition and options.  I introduced Palliative Medicine as specialized medical care for people living with serious illness. It focuses on providing relief from the symptoms and stress of a serious illness. The goal is to improve quality of life for both the patient and the family.  We discussed a brief life review of the patient. She tells me she lives at home with her spouse. She has 2 sons and 2 granddaughters. She tells me about their lives and her relationship with them. She tells me about living in [place] her whole life, attending the same church. Tells me about the support she receives from her church.   As far as functional and nutritional status, she tells me she was doing well prior to her decline about 2 weeks ago. She  was able to independently care for herself. She had a good appetite. She tells me over the past two weeks her function has been limited d/t weakness and she has had no appetite.    We discussed patient's current illness and what it means in the larger context of patient's on-going co-morbidities.  Natural disease trajectory and expectations at EOL were discussed. We discussed her current covid infection and treatment for it. We discussed her liver disease and kidney disease. We discussed her recurrent ascites and need for paracentesis. We discuss that TIPS has been recommended against.   I attempted to elicit values and goals of care important to the patient.    The difference between aggressive medical intervention and comfort care was considered in light of the patient's goals of care.   Patient is clear that she would never want to be on a ventilator and would never want a feeding tube. Encouraged patient to consider DNR status understanding evidenced based poor outcomes in similar hospitalized patients, as the cause of the arrest is likely associated with chronic/terminal disease rather than a reversible acute cardio-pulmonary event. We discussed especially in light of her strong desire to not be intubated that DNR status is likely more in line with her goals of care. Patient tells me she needs time to consider.   Discussed with patient the importance of continued conversation with family and the medical providers regarding overall plan of care and treatment options, ensuring decisions are within the context of the patients values and GOCs.    Hospice and Palliative Care services outpatient were explained and offered. Will continue discussion. Patient agreeable to follow up 2/9.  Questions and concerns were addressed. The family was encouraged to call with questions or concerns.    Primary Decision Maker PATIENT    SUMMARY OF RECOMMENDATIONS   - ongoing conversations - patient quite  emotional during today's conversation as she considers her condition and goals of care - DNR recommended - patient clear she would never want medical interventions of intubation or feeding tube placement  Code Status/Advance Care Planning: Limited code - DNI     Primary Diagnoses: Present on Admission:  Failure to thrive in adult  Hypothyroidism  GERD (gastroesophageal reflux disease)  Adenocarcinoma of colon ([place])  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[place])  Acute renal failure superimposed on stage 3b chronic kidney disease ([place])  Protein-calorie malnutrition, severe   I have reviewed the medical record, interviewed the patient and family, and examined the patient. The following aspects are pertinent.  Past Medical History:  Diagnosis Date   Allergy    Anemia    hx of   Anxiety    Aortic stenosis    mild by 04/08/20 echo   Arthritis    Colon cancer ([place]) dx'd 11/2014   Diabetes mellitus without complication ([facility])    TYPE II   Dyspnea    Fatty liver    Gallbladder disease    GERD (gastroesophageal reflux disease)    Heart murmur    History of gout    Hypertension    Hypothyroidism    Kidney disorder    spot on left kidney   Osteoporosis    PONV (postoperative nausea and vomiting)    Social History   Socioeconomic History   Marital status: Married    Spouse name: Not on file   Number of children: 2   Years of education: Not on file   Highest education level: Not on file  Occupational History   Occupation: Retired  Tobacco Use   Smoking status: Never   Smokeless tobacco: Never  Vaping Use   Vaping Use: Never used  Substance and Sexual Activity   Alcohol use: No   Drug use: No   Sexual activity: Never  Other Topics Concern   Not on file  Social History Narrative   03/03/15-Married, husband Jeneen Rinks   #2 grown sons and #2 grand daughters   Social Determinants of Health   Financial Resource Strain: Not on file  Food Insecurity: Not on file   Transportation Needs: Not on file  Physical Activity: Not on file  Stress: Not on file  Social Connections: Not on file   Family History  Problem Relation Age of Onset   Diabetes Mother    Hypertension Other    COPD Father    Hypertension Sister    GER disease Sister    Heart attack Brother    COPD Brother    Lung cancer Brother    COPD Brother    Hypertension Brother    Scheduled Meds:  insulin aspart  0-9 Units Subcutaneous TID WC   lactose free nutrition  237 mL Oral TID WC   levothyroxine  88 mcg Oral Daily   molnupiravir EUA  4 capsule Oral BID   multivitamin with minerals  1 tablet Oral Daily   pantoprazole  40 mg Oral Q0600   sodium bicarbonate  650 mg Oral BID   Continuous Infusions: PRN Meds:.acetaminophen, benzonatate, LORazepam, ondansetron **OR** ondansetron (ZOFRAN) IV Allergies  Allergen Reactions   Aspirin Other (See Comments)    Runny nose    Codeine Nausea And Vomiting  Tape Other (See Comments)    Unnamed "medical tape" BLISTERS THE SKIN!!   Review of Systems  Constitutional:  Positive for activity change, appetite change and fatigue.  Respiratory:  Positive for cough. Negative for shortness of breath.   Neurological:  Positive for weakness.   Physical Exam Constitutional:      General: She is not in acute distress.    Appearance: She is ill-appearing.  Pulmonary:     Effort: Pulmonary effort is normal.  Skin:    General: Skin is warm and dry.  Neurological:     Mental Status: She is alert and oriented to person, place, and time.  Psychiatric:        Mood and Affect: Affect is tearful.    Vital Signs: BP (!) 145/72 (BP Location: Left Arm)    Pulse (!) 103    Temp (!) 97.2 F (36.2 C) (Oral)    Resp 16    Ht 5' 7"  (1.702 m)    Wt 84.4 kg    SpO2 99%    BMI 29.14 kg/m  Pain Scale: 0-10   Pain Score: 0-No pain   SpO2: SpO2: 99 % O2 Device:SpO2: 99 % O2 Flow Rate: .   IO: Intake/output summary:  Intake/Output Summary (Last 24  hours) at 08/23/2021 1456 Last data filed at 08/23/2021 1400 Gross per 24 hour  Intake 540 ml  Output 0 ml  Net 540 ml    LBM: Last BM Date: 08/23/21 Baseline Weight: Weight: 84.4 kg Most recent weight: Weight: 84.4 kg     Palliative Assessment/Data: PPS 40%    Time Total: 80 minutes  Juel Burrow, DNP, [facility] Palliative Medicine Team [phone] Pager: [phone]

## 2021-08-23 NOTE — Progress Notes (Signed)
[facility]                                                                               Cheryl Jordan, is a 81 y.o. female, DOB - 1941-04-17, GBT:[id number] Admit date - 08/18/2021    Outpatient Primary MD for the patient is Cheryl Jordan, Cheryl Potash IV, FNP  LOS - 5  days    Brief summary   Patient is a 81 year old female with diabetes mellitus type 2, end-stage liver disease secondary to Cheryl Jordan cirrhosis, adeno CA colon, recurrent ascites, portal hypertension gastropathy, esophageal varices, hypothyroidism, presented with poor p.o. intake, nausea and diarrhea. She had recently been discharged from the hospital on 1/27 after she was admitted for AKI on CKD.  There was discussion regarding performing TIPS procedure as outpatient. Patient was found to have COVID-19 infection.  Currently not requiring O2, no infiltrates Underwent paracentesis on 2/6.  GI recommended an baster TIPS procedure, recommended palliative medicine consult.    Assessment & Plan    Assessment and Plan: * COVID-19 virus infection - Presented with a cough, poor appetite, diarrhea but no dyspnea.  COVID-19 + -Currently no hypoxia or requiring O2 supplementation.  Chest x-ray showed no infiltrates. -Patient was placed on molnupiravir considering immunocompromised status.  Decompensated [place] cirrhosis [facility]) - End-stage liver disease, secondary to Cheryl Jordan cirrhosis, refractory ascites, portal hypertension. -GI was consulted for possibility of TIPS procedure however recommended against TIPS due to possibility of worsening encephalopathy and risk of cardiac decompensation -Continue 2 g low-sodium diet, therapeutic paracentesis as needed.  Hold diuretics -Palliative medicine consulted  Abdominal ascites - Status post paracentesis on 2/6, 6 L removed   Acute renal failure superimposed on stage 3b chronic kidney disease ([place])- (present on admission) - Present with creatinine of 3.36 -Now improving to 2.8, appears  to be closer to her new baseline (2.6-2.8) -Continue sodium bicarb 650 milligram twice daily  Failure to thrive in adult- (present on admission) - PT OT evaluation, consulted palliative medicine  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[place])- (present on admission) - CBGs currently stable  Protein-calorie malnutrition, severe- (present on admission) - Continue nutritional supplements  Adenocarcinoma of colon ([place])- (present on admission) - History of colon CA stage IIIc, follows Dr. Learta Jordan -Continue outpatient monitoring  GERD (gastroesophageal reflux disease)- (present on admission) - Continue PPI  Hypothyroidism- (present on admission) - Continue Synthroid, TSH 2.01 08/08/2021    Code Status: partial DVT Prophylaxis:  SCDs Start: 08/18/21 2308   Level of Care: Level of care: Jordan-Surg Family Communication:    Disposition Plan:     Status is: Inpatient Remains inpatient appropriate because: awaiting PT, palliative, DC in 24-48hrs     Time Spent in minutes   35 mins   Procedures:  Paracentesis on 2/6  Consultants:   GI  Antimicrobials:   Anti-infectives (From admission, onward)    Start     Dose/Rate Route Frequency Ordered Stop   08/19/21 1300  molnupiravir EUA (LAGEVRIO) capsule 800 mg        4 capsule Oral 2 times daily 08/19/21 1217 08/24/21 0959        Medications  Scheduled Meds:  insulin aspart  0-9  Units Subcutaneous TID WC   lactose free nutrition  237 mL Oral TID WC   levothyroxine  88 mcg Oral Daily   molnupiravir EUA  4 capsule Oral BID   multivitamin with minerals  1 tablet Oral Daily   pantoprazole  40 mg Oral Q0600   sodium bicarbonate  650 mg Oral BID   Continuous Infusions: PRN Meds:.acetaminophen, benzonatate, LORazepam, ondansetron **OR** ondansetron (ZOFRAN) Jordan    Subjective:   Cheryl Jordan was seen and examined today.  + nausea. No abdominal pain, fevers.   Patient denies dizziness, chest pain, shortness of breath.  No acute  events overnight.    Objective:   Vitals:   08/22/21 1100 08/22/21 1423 08/22/21 2122 08/23/21 0554  BP:  (!) 119/58 (!) 118/59   Pulse:  94 98 80  Resp:  18 16 16   Temp:  97.6 F (36.4 C) 97.7 F (36.5 C) 97.7 F (36.5 C)  TempSrc:  Oral Oral Oral  SpO2: 98% 98% 97% 95%  Weight:      Height:        Intake/Output Summary (Last 24 hours) at 08/23/2021 1029 Last data filed at 08/23/2021 1000 Gross per 24 hour  Intake 660 ml  Output 0 ml  Net 660 ml   Filed Weights   08/18/21 1315  Weight: 84.4 kg     Exam General: Alert and oriented x 3, NAD Cardiovascular: S1 S2 auscultated, no murmurs, RRR Respiratory: Clear to auscultation bilaterally, no wheezing Gastrointestinal: Soft, nontender, mildly distended, + bowel sounds Ext: no pedal edema bilaterally Neuro: no new deficits Psych: Normal affect and demeanor, alert and oriented x3    Data Reviewed:  I have personally reviewed following labs and imaging studies   CBC Lab Results  Component Value Date   WBC 4.5 08/22/2021   RBC 3.08 (L) 08/22/2021   HGB 8.9 (L) 08/22/2021   HCT 28.7 (L) 08/22/2021   MCV 93.2 08/22/2021   MCH 28.9 08/22/2021   PLT 122 (L) 08/22/2021   MCHC 31.0 08/22/2021   RDW 15.5 08/22/2021   LYMPHSABS 0.9 08/18/2021   MONOABS 0.3 08/18/2021   EOSABS 0.0 08/18/2021   BASOSABS 0.0 16/04/9603     Last metabolic panel Lab Results  Component Value Date   NA 137 08/23/2021   K 4.4 08/23/2021   CL 114 (H) 08/23/2021   CO2 18 (L) 08/23/2021   BUN 48 (H) 08/23/2021   CREATININE 2.87 (H) 08/23/2021   GLUCOSE 95 08/23/2021   GFRNONAA 16 (L) 08/23/2021   GFRAA 59 (L) 11/17/2017   CALCIUM 8.2 (L) 08/23/2021   PHOS 5.0 (H) 08/08/2021   PHOS 4.9 (H) 08/08/2021   PROT 4.7 (L) 08/22/2021   ALBUMIN 2.3 (L) 08/22/2021   BILITOT 0.9 08/22/2021   ALKPHOS 72 08/22/2021   AST 25 08/22/2021   ALT 11 08/22/2021   ANIONGAP 5 08/23/2021    CBG (last 3)  Recent Labs    08/22/21 2144  08/23/21 0258 08/23/21 0727  GLUCAP 123* 93 107*     GFR: Estimated Creatinine Clearance: 17.7 mL/min (A) (by C-G formula based on SCr of 2.87 mg/dL (H)).  Coagulation Profile: Recent Labs  Lab 08/19/21 0535  INR 1.3*    Recent Results (from the past 240 hour(s))  Resp Panel by RT-PCR (Flu A&B, Covid) Nasopharyngeal Swab     Status: Abnormal   Collection Time: 08/18/21  3:40 PM   Specimen: Nasopharyngeal Swab; Nasopharyngeal(NP) swabs in vial transport medium  Result Value Ref Range  Status   SARS Coronavirus 2 by RT PCR POSITIVE (A) NEGATIVE Final    Comment: (NOTE) SARS-CoV-2 target nucleic acids are DETECTED.  The SARS-CoV-2 RNA is generally detectable in upper respiratory specimens during the acute phase of infection. Positive results are indicative of the presence of the identified virus, but do not rule out bacterial infection or co-infection with other pathogens not detected by the test. Clinical correlation with patient history and other diagnostic information is necessary to determine patient infection status. The expected result is Negative.  Fact Sheet for Patients: EntrepreneurPulse.com.au  Fact Sheet for Healthcare Providers: IncredibleEmployment.be  This test is not yet approved or cleared by the [place] FDA and  has been authorized for detection and/or diagnosis of SARS-CoV-2 by FDA under an Emergency Use Authorization (EUA).  This EUA will remain in effect (meaning this test can be used) for the duration of  the COVID-19 declaration under Section 564(b)(1) of the A ct, 21 U.S.C. section 360bbb-3(b)(1), unless the authorization is terminated or revoked sooner.     Influenza A by PCR NEGATIVE NEGATIVE Final   Influenza B by PCR NEGATIVE NEGATIVE Final    Comment: (NOTE) The Xpert Xpress SARS-CoV-2/FLU/RSV plus assay is intended as an aid in the diagnosis of influenza from Nasopharyngeal swab specimens  and should not be used as a sole basis for treatment. Nasal washings and aspirates are unacceptable for Xpert Xpress SARS-CoV-2/FLU/RSV testing.  Fact Sheet for Patients: EntrepreneurPulse.com.au  Fact Sheet for Healthcare Providers: IncredibleEmployment.be  This test is not yet approved or cleared by the [place] FDA and has been authorized for detection and/or diagnosis of SARS-CoV-2 by FDA under an Emergency Use Authorization (EUA). This EUA will remain in effect (meaning this test can be used) for the duration of the COVID-19 declaration under Section 564(b)(1) of the Act, 21 U.S.C. section 360bbb-3(b)(1), unless the authorization is terminated or revoked.  Performed at [facility], [place] [address] [place], [place] [address]           Estill Cotta M.D. [facility] 08/23/2021, 10:29 AM  Available via Epic secure chat 7am-7pm After 7 pm, please refer to night coverage provider listed on amion.

## 2021-08-23 NOTE — Assessment & Plan Note (Signed)
Continue PPI ?

## 2021-08-23 NOTE — Assessment & Plan Note (Signed)
-   History of colon CA stage IIIc, follows Dr. Learta Codding -Continue outpatient monitoring

## 2021-08-23 NOTE — Assessment & Plan Note (Addendum)
-   Present with creatinine of 3.36 -Now improving to 2.8, appears to be closer to her new baseline (2.6-2.8) -Continue sodium bicarb tabs, 325 mg twice daily, recommend outpatient follow-up with nephrology

## 2021-08-23 NOTE — Progress Notes (Signed)
°   08/23/21 1500  Mobility  Activity Ambulated with assistance in hallway  Level of Assistance Standby assist, set-up cues, supervision of patient - no hands on  Assistive Device Front wheel walker  Distance Ambulated (ft) 200 ft  Activity Response Tolerated well  $Mobility charge 1 Mobility   Pt was distressed upon entry, stated she was anxious and upset about dying soon. Sat and comforted pt for several minutes. Pt agreeable to mobilize this afternoon. Ambulated about 258ft in hall with RW, tolerated well. No complaints. Left pt in bed, call bell at side. RN/NT notified of session.  [place] Specialist Acute Rehab Services Office: [phone]

## 2021-08-23 NOTE — Assessment & Plan Note (Addendum)
-   End-stage liver disease, secondary to [facility] cirrhosis, refractory ascites, portal hypertension. -GI was consulted for possibility of TIPS procedure however recommended against TIPS due to possibility of worsening encephalopathy and risk of cardiac decompensation -Continue 2 g low-sodium diet, therapeutic paracentesis as needed.  Hold diuretics -Patient was seen by palliative medicine, appreciate recommendations.  Referrals to outpatient palliative placed.

## 2021-08-23 NOTE — Assessment & Plan Note (Addendum)
-   Patient was seen by PT OT evaluation, recommended home health PT, DME commode and rolling walker.

## 2021-08-23 NOTE — Assessment & Plan Note (Signed)
Continue nutritional supplements. °

## 2021-08-23 NOTE — Hospital Course (Signed)
Patient is a 81 year old female with diabetes mellitus type 2, end-stage liver disease secondary to [facility] cirrhosis, adeno CA colon, recurrent ascites, portal hypertension gastropathy, esophageal varices, hypothyroidism, presented with poor p.o. intake, nausea and diarrhea. She had recently been discharged from the hospital on 1/27 after she was admitted for AKI on CKD.  There was discussion regarding performing TIPS procedure as outpatient. Patient was found to have COVID-19 infection.  Currently not requiring O2, no infiltrates Underwent paracentesis on 2/6.  GI recommended an baster TIPS procedure, recommended palliative medicine consult.

## 2021-08-23 NOTE — Assessment & Plan Note (Signed)
-   Continue Synthroid, TSH 2.01 08/08/2021

## 2021-08-23 NOTE — Assessment & Plan Note (Signed)
-   Status post paracentesis on 2/6, 6 L removed

## 2021-08-24 DIAGNOSIS — Z66 Do not resuscitate: Secondary | ICD-10-CM

## 2021-08-24 LAB — GLUCOSE, CAPILLARY
Glucose-Capillary: 90 mg/dL (ref 70–99)
Glucose-Capillary: 96 mg/dL (ref 70–99)
Glucose-Capillary: 133 mg/dL — ABNORMAL HIGH (ref 70–99)
Glucose-Capillary: 108 mg/dL — ABNORMAL HIGH (ref 70–99)

## 2021-08-24 MED ORDER — PANTOPRAZOLE SODIUM 40 MG PO TBEC: 40.0000 mg | DELAYED_RELEASE_TABLET | Freq: Two times a day (BID) | ORAL | 3 refills | Status: DC

## 2021-08-24 MED ORDER — LOPERAMIDE HCL 2 MG PO CAPS
2.0000 mg | ORAL_CAPSULE | Freq: Once | ORAL | Status: AC
  Administered 2021-08-24: 2 mg via ORAL
  Filled 2021-08-24: qty 1

## 2021-08-24 MED ORDER — SODIUM BICARBONATE 325 MG PO TABS: 325.0000 mg | ORAL_TABLET | Freq: Two times a day (BID) | ORAL | 1 refills | Status: DC

## 2021-08-24 MED ORDER — SODIUM BICARBONATE 650 MG PO TABS
325.0000 mg | ORAL_TABLET | Freq: Two times a day (BID) | ORAL | Status: DC
  Administered 2021-08-24: 325 mg via ORAL
  Filled 2021-08-24: qty 1

## 2021-08-24 MED ORDER — SUCRALFATE 1 G PO TABS: 1.0000 g | ORAL_TABLET | Freq: Two times a day (BID) | ORAL | 0 refills | Status: DC

## 2021-08-24 MED ORDER — ONDANSETRON HCL 4 MG PO TABS: 4.0000 mg | ORAL_TABLET | Freq: Three times a day (TID) | ORAL | 1 refills | Status: AC | PRN

## 2021-08-24 NOTE — TOC Transition Note (Signed)
Transition of Care [facility]) - CM/SW Discharge Note   Patient Details  Name: Cheryl Jordan MRN: [id number] Date of Birth: September 29, 1940  Transition of [facility]) CM/SW Contact:  Lennart Pall, LCSW Phone Number: 08/24/2021, 3:04 PM   Clinical Narrative:    Orders placed for DME and HHPT. Pt agreeable and no agency preferences.  RW and 3n1 ordered via [place] for delivery to patient's room.  HHPT arranged with [facility].  No further TOC needs.   Final next level of care: [place] Barriers to Discharge: No Barriers Identified   Patient Goals and CMS Choice Patient states their goals for this hospitalization and ongoing recovery are:: return home      Discharge Placement                       Discharge Plan and Services                DME Arranged: 3-N-1, Walker rolling DME Agency: [employer] Date DME Agency Contacted: 08/24/21 Time DME Agency Contacted: 2119 Representative spoke with at DME Agency: [place]: PT [place]: [place] Date [place]: 08/24/21 Time [place]: 1504 Representative spoke with at [place]: [place] ([place]) Interventions     Readmission Risk Interventions Readmission Risk Prevention Plan 08/10/2021 05/11/2020  Transportation Screening Complete Complete  PCP or Specialist Appt within 5-7 Days - Complete  Home Care Screening - Complete  Medication Review ([place]) Complete -  PCP or Specialist appointment within 3-5 days of discharge Complete -  [place] or Home Care Consult Complete -  SW Recovery Care/Counseling Consult Complete -  Palliative Care Screening Not Applicable -  [place] Not Applicable -  Some recent data might be hidden

## 2021-08-24 NOTE — Progress Notes (Signed)
Assessment unchanged. Son here to take pt home. Pt verbalized understanding of dc instructions. Equipment arrived earlier to take home [facility] and [facility]ford Rile). Discharged via wc to front entrance accompanied by nurse and NT.

## 2021-08-24 NOTE — Discharge Summary (Signed)
Physician Discharge Summary   [name] MRN: [id number] DOB: 07/09/1941  Admit date:     08/18/2021  Discharge date: 08/24/21  Discharge Physician: Estill Cotta   PCP: Drue Second IV, FNP   Recommendations at discharge:   Placed on sodium bicarb tabs 325 mg p.o. twice daily.  Please follow-up BMET at the appointment and readjust. 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 intake, nausea and diarrhea. She had recently been discharged from the hospital on 1/27 after she was admitted for AKI on CKD.  There was discussion regarding performing TIPS procedure as outpatient. Patient was found to have COVID-19 infection.  Currently not requiring O2, no infiltrates Underwent paracentesis on 2/6.  GI recommended an baster TIPS procedure, recommended palliative medicine consult.   Assessment and Plan: * COVID-19 virus infection - Presented with a cough, poor appetite, diarrhea but no dyspnea.  COVID-19 + -Currently no hypoxia or requiring O2 supplementation.  Chest x-ray showed no infiltrates. -Completed molnupiravir course for 5 days  Decompensated [place] cirrhosis [facility] [place]) - End-stage liver disease, secondary to [facility] cirrhosis, refractory ascites, portal hypertension. -GI was consulted for possibility of TIPS procedure however  recommended against TIPS due to possibility of worsening encephalopathy and risk of cardiac decompensation -Continue 2 g low-sodium diet, therapeutic paracentesis as needed.  Hold diuretics -Patient was seen by palliative medicine, appreciate recommendations.  Referrals to outpatient palliative placed.  Abdominal ascites - Status post paracentesis on 2/6, 6 L removed   Acute renal failure superimposed on stage 3b chronic kidney disease ([place])- (present on admission) - Present with creatinine of 3.36 -Now improving to 2.8, appears to be closer to her new baseline (2.6-2.8) -Continue sodium bicarb tabs, 325 mg twice daily, recommend outpatient follow-up with nephrology  Failure to thrive in adult- (present on admission) - Patient was seen by PT OT evaluation, recommended home health PT, DME commode and rolling walker.  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 Learta Codding -Continue outpatient monitoring  GERD (gastroesophageal reflux disease)- (present on admission) - Continue PPI  Hypothyroidism- (present on admission) - Continue Synthroid, TSH 2.01 08/08/2021         Pain control - [facility] Controlled Substance Reporting System database was reviewed. and patient was instructed, not to drive, operate heavy machinery, perform activities at heights, swimming or participation in water activities or provide baby-sitting services while on Pain, Sleep and Anxiety Medications; until their outpatient Physician has advised to do so again. Also recommended to not to take more than prescribed Pain, Sleep and Anxiety Medications.   Consultants: Gastroenterology, palliative medicine Procedures performed: Paracentesis Disposition: Home Diet  recommendation:  Discharge Diet Orders (From admission, onward)     Start  Ordered   08/24/21 0000  Diet - low sodium heart healthy        08/24/21 1247           Cardiac diet  DISCHARGE MEDICATION: Allergies as of 08/24/2021       Reactions   Aspirin Other (See Comments)   Runny nose    Codeine Nausea And Vomiting   Tape Other (See Comments)   Unnamed "medical tape" BLISTERS THE SKIN!!        Medication List     TAKE these medications    Accu-Chek Aviva Plus test strip Generic drug: glucose blood   acetaminophen 500 MG tablet Commonly known as: TYLENOL Take 500 mg by mouth daily as needed for moderate pain.   benzonatate 100 MG capsule Commonly known as: Tessalon Perles Take 1 capsule (100 mg total) by mouth 3 (three) times daily as needed for cough.   GAS-X PO Take 1 tablet by mouth as needed (flatulence).   glipiZIDE 5 MG 24 hr tablet Commonly known as: GLUCOTROL XL Take 5 mg by mouth daily with breakfast.   guaiFENesin-dextromethorphan 100-10 MG/5ML syrup Commonly known as: ROBITUSSIN DM Take 5 mLs by mouth every 4 (four) hours as needed for cough.   lactose free nutrition Liqd Take 237 mLs by mouth 3 (three) times daily with meals.   levothyroxine 88 MCG tablet Commonly known as: SYNTHROID Take 1 tablet (88 mcg total) by mouth daily.   loperamide 2 MG capsule Commonly known as: IMODIUM Take 2 mg by mouth as needed for diarrhea or loose stools.   LORazepam 0.5 MG tablet Commonly known as: ATIVAN Take 0.5 mg by mouth 2 (two) times daily as needed for anxiety.   ondansetron 4 MG tablet Commonly known as: Zofran Take 1 tablet (4 mg total) by mouth every 8 (eight) hours as needed for nausea or vomiting. What changed: when to take this   pantoprazole 40 MG tablet Commonly known as: PROTONIX Take 1 tablet (40 mg total) by mouth 2 (two) times daily before a meal.   sodium bicarbonate 325 MG tablet Take 1 tablet (325 mg total) by mouth 2  (two) times daily.   sucralfate 1 g tablet Commonly known as: CARAFATE Take 1 tablet (1 g total) by mouth 2 (two) times daily.               Durable Medical Equipment  (From admission, onward)           Start     Ordered   08/24/21 1245  For home use only DME Walker rolling  Once       Question Answer Comment  Walker: With [place] Wheels   Patient needs a walker to treat with the following condition Gait instability      08/24/21 1244   08/24/21 1244  For home use only DME Bedside commode  Once       Question:  Patient needs a bedside commode to treat with the following condition  Answer:  Gait instability   08/24/21 1243            Follow-up Information     Wannetta Sender, FNP. Schedule an appointment as soon as possible for a visit in 2 week(s).   Specialty: Family Medicine Why: for hospital follow-up Contact information: [facility] [address] [place] [address] [phone]         [name], MD. Schedule an appointment as  soon as possible for a visit in 2 week(s).   Specialty: Gastroenterology Why: for hospital follow-up Contact information: [address] [address] [phone]                 Discharge Exam: Filed Weights   08/18/21 1315  Weight: 84.4 kg   S: Ambulating, no acute complaints except nausea, chronic issue.  Vitals:   08/23/21 0554 08/23/21 1427 08/23/21 2135 08/24/21 0500  BP:  (!) 145/72 (!) 105/49 105/60  Pulse: 80 (!) 103 87 86  Resp: 16  18 17   Temp: 97.7 F (36.5 C) (!) 97.2 F (36.2 C) 98.7 F (37.1 C) 98.3 F (36.8 C)  TempSrc: Oral Oral Oral   SpO2: 95% 99% 96% 97%  Weight:      Height:         Physical Exam General: Alert and oriented x 3, NAD Cardiovascular: S1 S2 clear, RRR. No pedal edema b/l Respiratory: CTAB, no wheezing, rales or rhonchi Gastrointestinal: Soft, nontender, mildly distended, NBS Ext: no pedal edema  bilaterally Neuro: no new deficits Psych: Normal affect and demeanor, alert and oriented x3    Condition at discharge: fair  The results of significant diagnostics from this hospitalization (including imaging, microbiology, ancillary and laboratory) are listed below for reference.   Imaging Studies: CT ABDOMEN PELVIS WO CONTRAST  Result Date: 08/08/2021 CLINICAL DATA:  Nausea/vomiting Bowel obstruction suspected Acute on chronic kidney disease.  Liver cirrhosis.  Diarrhea. EXAM: CT ABDOMEN AND PELVIS WITHOUT CONTRAST TECHNIQUE: Multidetector CT imaging of the abdomen and pelvis was performed following the standard protocol without IV contrast. RADIATION DOSE REDUCTION: This exam was performed according to the departmental dose-optimization program which includes automated exposure control, adjustment of the mA and/or kV according to patient size and/or use of iterative reconstruction technique. COMPARISON:  Abdominal MRI 05/31/2021.  Abdominal CT 12/13/2020 FINDINGS: Lower chest: Chronic left pleural effusion, increased from prior abdominal CT. There is a trace right pleural effusion, new. Associated linear and passive atelectasis in the lung bases. Hepatobiliary: Cirrhotic hepatic morphology. Low-density within segment 7 of the liver corresponds to ablation defect on prior MRI. This is not well assessed on this unenhanced exam. The 1.1 cm lesion on MRI is not well seen on the current exam. Pneumobilia with air in the common bile duct and central intrahepatic ducts. Post cholecystectomy. Pancreas: Normal atrophy.  No ductal dilatation or inflammation. Spleen: Splenomegaly with spleen spanning 15.4 cm cranial caudal. Adrenals/Urinary Tract: No adrenal nodule. Bilateral renal parenchymal thinning and atrophy. No hydronephrosis. No renal calculi. Multiple bilateral renal cysts, including a hyperdense cyst in the mid upper left kidney, unchanged from prior exam. Multiple surgical clips adjacent to the left  kidney unchanged. The urinary bladder is minimally distended. No bladder Ruggerio thickening. Stomach/Bowel: Equivocal Hart thickening about the gastric cardia and gastric body. There is no abnormal gastric distension. No bowel obstruction with administered enteric contrast in the small bowel and to the level of the sigmoid colon. No significant small bowel inflammation. Prior right hemicolectomy. The appendix is not visualized, presumably absent. Mild sigmoid colonic diverticulosis. No diverticulitis. No obvious colonic inflammation, although the presence of ascites partially obscures detailed assessment. Vascular/Lymphatic: Aortic atherosclerosis. No aortic aneurysm. Multiple small retroperitoneal lymph nodes, not enlarged by size criteria. No definite enlarged lymph nodes in the abdomen or pelvis. Reproductive: Normal quiescent appearance of the uterus. No obvious adnexal mass, ascites the pelvis partially obscures a voxel assessment. Other: Moderate volume abdominopelvic ascites. Mild generalized mesenteric  edema with small amount of mesenteric ascites. No free air or loculated fluid collection. Musculoskeletal: The bones are diffusely under mineralized. Prominent Schmorl's node within superior endplate of X32 is unchanged. There are no acute or suspicious osseous abnormalities. IMPRESSION: 1. No bowel obstruction, enteric contrast reaches the colon. 2. Equivocal Carriveau thickening about the gastric cardia and gastric body, can be seen with gastritis or peptic ulcer disease. 3. Cirrhosis with portal hypertension, splenomegaly, and moderate volume abdominopelvic ascites. Low-density in the right hepatic lobe corresponds to ablation defect on prior MRI. The additional 1 cm right lobe liver lesion on MRI is not well seen. Limited assessment for liver lesions on this unenhanced exam. 4. Chronic left pleural effusion, increased from prior abdominal CT. Trace right pleural effusion, new from prior. 5. Minimal sigmoid  diverticulosis without diverticulitis. 6. Bilateral renal parenchymal thinning and atrophy with multiple renal cysts. No hydronephrosis. Aortic Atherosclerosis (ICD10-I70.0). Electronically Signed   By: Keith Rake M.D.   On: 08/08/2021 15:54   DG Chest 2 View  Result Date: 08/18/2021 CLINICAL DATA:  Weakness and loss of appetite. EXAM: CHEST - 2 VIEW COMPARISON:  earlier today FINDINGS: The heart size and mediastinal contours are within normal limits. The lung volumes are low. There is no pleural effusion or edema. No airspace opacities. The visualized skeletal structures are unremarkable. IMPRESSION: Low lung volumes.  No acute findings. Electronically Signed   By: Kerby Moors M.D.   On: 08/18/2021 18:55   MR ABDOMEN MRCP WO CONTRAST  Result Date: 08/11/2021 CLINICAL DATA:  Ascites, cirrhosis, hepatocellular carcinoma EXAM: MRI ABDOMEN WITHOUT CONTRAST  (INCLUDING MRCP) TECHNIQUE: Multiplanar multisequence MR imaging of the abdomen was performed. Heavily T2-weighted images of the biliary and pancreatic ducts were obtained, and three-dimensional MRCP images were rendered by post processing. COMPARISON:  CT abdomen and pelvis 08/08/2021, MRI abdomen 05/31/2021 FINDINGS: Study is limited due to lack of contrast, motion and large volume ascites. Lower chest: Small right and moderate left pleural effusions. Cardiomegaly. Hepatobiliary: Liver is nodular with inhomogeneous parenchyma consistent with cirrhosis. 3.5 x 2.4 cm heterogeneous mass in the right hepatic lobe consistent with previously ablated hepatocellular carcinoma which demonstrates peripheral hypointense T1, hyperintense T2 signal and central hyperintense T1, hypointense T2 signal. Mild surrounding hyperintense T2 signal hazy enhancement which extends towards the periphery/dome of the liver. No new hyperintense T2/DWI signal hepatic lesion identified. Gallbladder is surgically absent. No biliary ductal dilatation identified. Pancreas: Mildly  atrophic with no obvious mass or ductal dilatation visualized. Spleen:  Enlarged measuring 16 cm in length. Adrenals/Urinary Tract: Adrenal glands appear grossly normal. Kidneys are mildly atrophic and lobulated left greater than right. Multiple hyperintense T2 signal likely cysts identified bilaterally, left greater than right. No hydronephrosis identified. Stomach/Bowel: No evidence of bowel obstruction. Centralization of bowel loops secondary to ascites. Vascular/Lymphatic: No bulky lymphadenopathy appreciated given limitations. No abdominal aortic aneurysm demonstrated. Other:  Large volume ascites. Musculoskeletal: No suspicious bony lesions identified. IMPRESSION: 1. Large volume ascites. 2. Hepatic cirrhosis. Previously ablated mass in the right hepatic lobe. No definite new hepatic mass visualized. 3. Splenomegaly consistent with portal hypertension. 4. Renal atrophy and renal cortical cysts. 5. Small right and moderate left pleural effusions.  Cardiomegaly. Electronically Signed   By: Ofilia Neas M.D.   On: 08/11/2021 12:05   MR 3D Recon At Scanner  Result Date: 08/11/2021 CLINICAL DATA:  Ascites, cirrhosis, hepatocellular carcinoma EXAM: MRI ABDOMEN WITHOUT CONTRAST  (INCLUDING MRCP) TECHNIQUE: Multiplanar multisequence MR imaging of the abdomen was performed. Heavily T2-weighted  images of the biliary and pancreatic ducts were obtained, and three-dimensional MRCP images were rendered by post processing. COMPARISON:  CT abdomen and pelvis 08/08/2021, MRI abdomen 05/31/2021 FINDINGS: Study is limited due to lack of contrast, motion and large volume ascites. Lower chest: Small right and moderate left pleural effusions. Cardiomegaly. Hepatobiliary: Liver is nodular with inhomogeneous parenchyma consistent with cirrhosis. 3.5 x 2.4 cm heterogeneous mass in the right hepatic lobe consistent with previously ablated hepatocellular carcinoma which demonstrates peripheral hypointense T1, hyperintense T2  signal and central hyperintense T1, hypointense T2 signal. Mild surrounding hyperintense T2 signal hazy enhancement which extends towards the periphery/dome of the liver. No new hyperintense T2/DWI signal hepatic lesion identified. Gallbladder is surgically absent. No biliary ductal dilatation identified. Pancreas: Mildly atrophic with no obvious mass or ductal dilatation visualized. Spleen:  Enlarged measuring 16 cm in length. Adrenals/Urinary Tract: Adrenal glands appear grossly normal. Kidneys are mildly atrophic and lobulated left greater than right. Multiple hyperintense T2 signal likely cysts identified bilaterally, left greater than right. No hydronephrosis identified. Stomach/Bowel: No evidence of bowel obstruction. Centralization of bowel loops secondary to ascites. Vascular/Lymphatic: No bulky lymphadenopathy appreciated given limitations. No abdominal aortic aneurysm demonstrated. Other:  Large volume ascites. Musculoskeletal: No suspicious bony lesions identified. IMPRESSION: 1. Large volume ascites. 2. Hepatic cirrhosis. Previously ablated mass in the right hepatic lobe. No definite new hepatic mass visualized. 3. Splenomegaly consistent with portal hypertension. 4. Renal atrophy and renal cortical cysts. 5. Small right and moderate left pleural effusions.  Cardiomegaly. Electronically Signed   By: Ofilia Neas M.D.   On: 08/11/2021 12:05   US Paracentesis  Result Date: 08/21/2021 INDICATION: Patient with a history of cirrhosis, hepatocellular carcinoma and recurrent large volume ascites presents today for therapeutic paracentesis with 6 L max. EXAM: ULTRASOUND GUIDED PARACENTESIS MEDICATIONS: 1% lidocaine 10 mL COMPLICATIONS: None immediate. PROCEDURE: Informed written consent was obtained from the patient after a discussion of the risks, benefits and alternatives to treatment. A timeout was performed prior to the initiation of the procedure. Initial ultrasound scanning demonstrates a large  amount of ascites within the left lower abdominal quadrant. The left lower abdomen was prepped and draped in the usual sterile fashion. 1% lidocaine was used for local anesthesia. Following this, a 19 gauge, 7-cm, Yueh catheter was introduced. An ultrasound image was saved for documentation purposes. The paracentesis was performed. The catheter was removed and a dressing was applied. The patient tolerated the procedure well without immediate post procedural complication. Patient received post-procedure intravenous albumin; see nursing notes for details. FINDINGS: A total of approximately 6 L of clear yellow fluid was removed. IMPRESSION: Successful ultrasound-guided paracentesis yielding 6 liters of peritoneal fluid. Read by: Soyla Dryer, NP Electronically Signed   By: Jerilynn Mages.  Shick M.D.   On: 08/21/2021 15:26   US Paracentesis  Result Date: 08/11/2021 INDICATION: Patient with prior history of colon cancer; now with cirrhosis, hepatocellular carcinoma, renal insufficiency, recurrent ascites. Request received for diagnostic and therapeutic paracentesis up to 3 liters. EXAM: ULTRASOUND GUIDED DIAGNOSTIC AND THERAPEUTIC PARACENTESIS MEDICATIONS: 10 mL 1% lidocaine COMPLICATIONS: None immediate. PROCEDURE: Informed written consent was obtained from the patient after a discussion of the risks, benefits and alternatives to treatment. A timeout was performed prior to the initiation of the procedure. Initial ultrasound scanning demonstrates a large amount of ascites within the right lower abdominal quadrant. The right lower abdomen was prepped and draped in the usual sterile fashion. 1% lidocaine was used for local anesthesia. Following this, a 19 gauge, 10-cm,  Yueh catheter was introduced. An ultrasound image was saved for documentation purposes. The paracentesis was performed. The catheter was removed and a dressing was applied. The patient tolerated the procedure well without immediate post procedural complication.  FINDINGS: A total of approximately 3 liters of slightly hazy, yellow fluid was removed. Samples were sent to the laboratory as requested by the clinical team. IMPRESSION: Successful ultrasound-guided diagnostic and therapeutic paracentesis yielding 3 liters of peritoneal fluid. Read by: Rowe Robert, PA-C Electronically Signed   By: Jerilynn Mages.  Shick M.D.   On: 08/11/2021 15:37   DG Chest Port 1 View  Result Date: 08/18/2021 CLINICAL DATA:  Generalized weakness EXAM: PORTABLE CHEST 1 VIEW COMPARISON:  03/02/2021 FINDINGS: Mild left basilar atelectasis. No focal consolidation. No pleural effusion or pneumothorax. Heart and mediastinal contours are unremarkable. No acute osseous abnormality. IMPRESSION: No active disease. Electronically Signed   By: Kathreen Devoid M.D.   On: 08/18/2021 18:34   DG Abdomen Acute W/Chest  Result Date: 08/07/2021 CLINICAL DATA:  Nausea and abdominal pain. History of colon cancer, hepatic cirrhosis and intractable ascites. Paracentesis performed last week. EXAM: DG ABDOMEN ACUTE WITH 1 VIEW CHEST COMPARISON:  Portable chest 03/02/2021, CT abdomen and pelvis no contrast 12/13/2020. FINDINGS: There is no evidence of dilated bowel loops or free intraperitoneal air. There is suspected to be at least mild-to-moderate volume ascites, as there is apparent medial displacement of the ascending and descending colon, which was seen on the prior CT. No radiopaque calculi or other significant radiographic abnormality is seen. There are cholecystectomy clips. Heart size and mediastinal contours are within normal limits. There is a low pulmonary inspiration with a small left pleural effusion similar to both prior studies, and atelectasis in the hypoinflated lung bases. The visualized lungs are otherwise clear. IMPRESSION: 1. Small left pleural effusion appears similar to the prior studies. The lungs hypoinflated and otherwise generally clear except for lower zonal atelectasis. 2. No evidence of bowel  obstruction or free air. Probable mild-to-moderate ascites based on medial displacement of the ascending and descending colon as seen on the prior CT. Electronically Signed   By: Telford Nab M.D.   On: 08/07/2021 21:46   ECHOCARDIOGRAM COMPLETE  Result Date: 08/11/2021    ECHOCARDIOGRAM REPORT   Patient Name:   Cheryl Jordan Date of Exam: 08/11/2021 Medical Rec #:  [id number]   Height:       67.0 in Accession #:    [id number]  Weight:       186.1 lb Date of Birth:  1940/12/22    BSA:          1.961 m Patient Age:    12 years    BP:           96/68 mmHg Patient Gender: F           HR:           88 bpm. Exam Location:  Inpatient Procedure: 2D Echo, Cardiac Doppler and Color Doppler Indications:    murmur  History:        Patient has prior history of Echocardiogram examinations, most                 recent 04/08/2020. Signs/Symptoms:Dyspnea and Murmur; Risk                 Factors:Diabetes and Hypertension. AS / GERD.  Sonographer:    Beryle Beams Referring Phys: [id number] [place]  Sonographer Comments: No subcostal window. IMPRESSIONS  1. Left  ventricular ejection fraction, by estimation, is 65 to 70%. The left ventricle has normal function. The left ventricle has no regional Pesnell motion abnormalities. Left ventricular diastolic parameters are consistent with Grade I diastolic dysfunction (impaired relaxation).  2. Right ventricular systolic function is normal. The right ventricular size is normal. Tricuspid regurgitation signal is inadequate for assessing PA pressure.  3. There is a trivial pericardial effusion at the apex.  4. The mitral valve is normal in structure. No evidence of mitral valve regurgitation. No evidence of mitral stenosis.  5. The aortic valve is calcified. Aortic valve regurgitation is not visualized. Mild aortic valve stenosis. Aortic valve area, by VTI measures 1.36 cm. Aortic valve mean gradient measures 17.0 mmHg. Aortic valve Vmax measures 2.80 m/s. FINDINGS  Left Ventricle: Left  ventricular ejection fraction, by estimation, is 65 to 70%. The left ventricle has normal function. The left ventricle has no regional Mixson motion abnormalities. The left ventricular internal cavity size was normal in size. There is  no left ventricular hypertrophy. Left ventricular diastolic parameters are consistent with Grade I diastolic dysfunction (impaired relaxation). Normal left ventricular filling pressure. Right Ventricle: The right ventricular size is normal. No increase in right ventricular Duffus thickness. Right ventricular systolic function is normal. Tricuspid regurgitation signal is inadequate for assessing PA pressure. Left Atrium: Left atrial size was normal in size. Right Atrium: Right atrial size was normal in size. Pericardium: Trivial pericardial effusion is present. The pericardial effusion is surrounding the apex. Mitral Valve: The mitral valve is normal in structure. No evidence of mitral valve regurgitation. No evidence of mitral valve stenosis. Tricuspid Valve: The tricuspid valve is normal in structure. Tricuspid valve regurgitation is not demonstrated. No evidence of tricuspid stenosis. Aortic Valve: The aortic valve is calcified. Aortic valve regurgitation is not visualized. Mild aortic stenosis is present. Aortic valve mean gradient measures 17.0 mmHg. Aortic valve peak gradient measures 31.4 mmHg. Aortic valve area, by VTI measures 1.36 cm. Pulmonic Valve: The pulmonic valve was normal in structure. Pulmonic valve regurgitation is not visualized. No evidence of pulmonic stenosis. Aorta: The aortic root is normal in size and structure. Venous: The inferior vena cava was not well visualized. IAS/Shunts: No atrial level shunt detected by color flow Doppler.  LEFT VENTRICLE PLAX 2D LVIDd:         3.00 cm     Diastology LVIDs:         1.90 cm     LV e' medial:    5.11 cm/s LV PW:         1.00 cm     LV E/e' medial:  14.4 LV IVS:        1.10 cm     LV e' lateral:   7.18 cm/s LVOT diam:      1.50 cm     LV E/e' lateral: 10.3 LV SV:         74 LV SV Index:   38 LVOT Area:     1.77 cm  LV Volumes (MOD) LV vol d, MOD A2C: 52.7 ml LV vol d, MOD A4C: 60.3 ml LV vol s, MOD A2C: 21.8 ml LV vol s, MOD A4C: 23.1 ml LV SV MOD A2C:     30.9 ml LV SV MOD A4C:     60.3 ml LV SV MOD BP:      34.1 ml RIGHT VENTRICLE RV Basal diam:  3.60 cm RV Mid diam:    3.40 cm RV S prime:  17.30 cm/s TAPSE (M-mode): 2.0 cm LEFT ATRIUM             Index        RIGHT ATRIUM           Index LA diam:        2.70 cm 1.38 cm/m   RA Area:     12.60 cm LA Vol (A2C):   26.2 ml 13.36 ml/m  RA Volume:   31.20 ml  15.91 ml/m LA Vol (A4C):   52.2 ml 26.62 ml/m LA Biplane Vol: 37.7 ml 19.22 ml/m  AORTIC VALVE                     PULMONIC VALVE AV Area (Vmax):    1.16 cm      PV Vmax:       1.10 m/s AV Area (Vmean):   1.14 cm      PV Vmean:      67.900 cm/s AV Area (VTI):     1.36 cm      PV VTI:        0.197 m AV Vmax:           280.00 cm/s   PV Peak grad:  4.8 mmHg AV Vmean:          193.000 cm/s  PV Mean grad:  2.0 mmHg AV VTI:            0.543 m AV Peak Grad:      31.4 mmHg AV Mean Grad:      17.0 mmHg LVOT Vmax:         184.00 cm/s LVOT Vmean:        125.000 cm/s LVOT VTI:          0.418 m LVOT/AV VTI ratio: 0.77  AORTA Ao Root diam: 3.00 cm Ao Asc diam:  3.00 cm MITRAL VALVE MV Area (PHT): 2.56 cm     SHUNTS MV Decel Time: 296 msec     Systemic VTI:  0.42 m MV E velocity: 73.60 cm/s   Systemic Diam: 1.50 cm MV A velocity: 124.00 cm/s MV E/A ratio:  0.59 Fransico Him MD Electronically signed by Fransico Him MD Signature Date/Time: 08/11/2021/3:11:23 PM    Final    US LIVER DOPPLER  Result Date: 08/19/2021 CLINICAL DATA:  Cirrhosis EXAM: DUPLEX ULTRASOUND OF LIVER TECHNIQUE: Color and duplex Doppler ultrasound was performed to evaluate the hepatic in-flow and out-flow vessels. COMPARISON:  MRI abdomen 08/11/2021 FINDINGS: Liver: Cirrhotic liver morphology. 2.9 cm echogenic mass noted in the right liver lobe corresponds to the  ablated lesion seen on prior MRI. Main Portal Vein size: 1.5 cm Portal Vein Velocities Main Prox:  15 cm/sec Main Mid: 10 cm/sec Main Dist:  16 cm/sec Right: 9 cm/sec Left: 13 cm/sec Hepatic Vein Velocities Right:  15 cm/sec Middle:  28 cm/sec Left:  19 cm/sec IVC: Present and patent with normal respiratory phasicity. Hepatic Artery Velocity:  72 cm/sec Splenic Vein Velocity:  20 cm/sec Spleen: 13 cm x 9 cm x 12 cm with a total volume of 755 cm^3 (411 cm^3 is upper limit normal) Portal Vein Occlusion/Thrombus: No Splenic Vein Occlusion/Thrombus: No Ascites: Mild abdominal ascites. Varices: None Incidental note made of left renal cyst measuring 6.6 cm and the left pleural effusion. IMPRESSION: 1. Patent portal vein with appropriate direction of flow. 2. Splenomegaly 3. Mild ascites 4. Cirrhotic liver morphology Electronically Signed   By: Miachel Roux M.D.   On: 08/19/2021 08:18  IR Paracentesis  Result Date: 08/01/2021 INDICATION: Patient with a history of hepatocellular carcinoma, NASH cirrhosis and recurrent large volume ascites. Interventional radiology asked to perform a therapeutic paracentesis up to 8 L. EXAM: ULTRASOUND GUIDED PARACENTESIS MEDICATIONS: 1% lidocaine 10 mL COMPLICATIONS: None immediate. PROCEDURE: Informed written consent was obtained from the patient after a discussion of the risks, benefits and alternatives to treatment. A timeout was performed prior to the initiation of the procedure. Initial ultrasound scanning demonstrates a large amount of ascites within the right lower abdominal quadrant. The right lower abdomen was prepped and draped in the usual sterile fashion. 1% lidocaine was used for local anesthesia. Following this, a 19 gauge, 7-cm, Yueh catheter was introduced. An ultrasound image was saved for documentation purposes. The paracentesis was performed. The catheter was removed and a dressing was applied. The patient tolerated the procedure well without immediate post  procedural complication. Patient received post-procedure intravenous albumin; see nursing notes for details. FINDINGS: A total of approximately 7.2 L of clear yellow fluid was removed. IMPRESSION: Successful ultrasound-guided paracentesis yielding 7.2 liters of peritoneal fluid. Read by: Soyla Dryer, NP Electronically Signed   By: Markus Daft M.D.   On: 08/01/2021 10:57    Microbiology: Results for orders placed or performed during the hospital encounter of 08/18/21  Resp Panel by RT-PCR (Flu A&B, Covid) Nasopharyngeal Swab     Status: Abnormal   Collection Time: 08/18/21  3:40 PM   Specimen: Nasopharyngeal Swab; Nasopharyngeal(NP) swabs in vial transport medium  Result Value Ref Range Status   SARS Coronavirus 2 by RT PCR POSITIVE (A) NEGATIVE Final    Comment: (NOTE) SARS-CoV-2 target nucleic acids are DETECTED.  The SARS-CoV-2 RNA is generally detectable in upper respiratory specimens during the acute phase of infection. Positive results are indicative of the presence of the identified virus, but do not rule out bacterial infection or co-infection with other pathogens not detected by the test. Clinical correlation with patient history and other diagnostic information is necessary to determine patient infection status. The expected result is Negative.  Fact Sheet for Patients: EntrepreneurPulse.com.au  Fact Sheet for Healthcare Providers: IncredibleEmployment.be  This test is not yet approved or cleared by the [place] FDA and  has been authorized for detection and/or diagnosis of SARS-CoV-2 by FDA under an Emergency Use Authorization (EUA).  This EUA will remain in effect (meaning this test can be used) for the duration of  the COVID-19 declaration under Section 564(b)(1) of the A ct, 21 U.S.C. section 360bbb-3(b)(1), unless the authorization is terminated or revoked sooner.     Influenza A by PCR NEGATIVE NEGATIVE Final   Influenza B  by PCR NEGATIVE NEGATIVE Final    Comment: (NOTE) The Xpert Xpress SARS-CoV-2/FLU/RSV plus assay is intended as an aid in the diagnosis of influenza from Nasopharyngeal swab specimens and should not be used as a sole basis for treatment. Nasal washings and aspirates are unacceptable for Xpert Xpress SARS-CoV-2/FLU/RSV testing.  Fact Sheet for Patients: EntrepreneurPulse.com.au  Fact Sheet for Healthcare Providers: IncredibleEmployment.be  This test is not yet approved or cleared by the [place] FDA and has been authorized for detection and/or diagnosis of SARS-CoV-2 by FDA under an Emergency Use Authorization (EUA). This EUA will remain in effect (meaning this test can be used) for the duration of the COVID-19 declaration under Section 564(b)(1) of the Act, 21 U.S.C. section 360bbb-3(b)(1), unless the authorization is terminated or revoked.  Performed at [name], [address] [place], [place]  [address]     Labs: CBC: Recent Labs  Lab 08/18/21 1527 08/19/21 0535 08/20/21 0454 08/22/21 0446  WBC 4.8 4.0 4.1 4.5  NEUTROABS 3.5  --   --   --   HGB 10.2* 9.7* 8.9* 8.9*  HCT 32.1* 31.1* 28.4* 28.7*  MCV 91.2 91.7 91.0 93.2  PLT 111* 117* 110* 153*   Basic Metabolic Panel: Recent Labs  Lab 08/19/21 0535 08/20/21 0454 08/21/21 0416 08/22/21 0446 08/23/21 0430  NA 133* 136 139 138 137  K 4.4 3.9 4.0 4.2 4.4  CL 106 110 114* 115* 114*  CO2 18* 18* 18* 18* 18*  GLUCOSE 108* 108* 113* 102* 95  BUN 52* 50* 45* 46* 48*  CREATININE 3.06* 2.87* 2.65* 2.72* 2.87*  CALCIUM 8.0* 8.1* 8.2* 8.2* 8.2*   Liver Function Tests: Recent Labs  Lab 08/18/21 1527 08/19/21 0535 08/22/21 0446  AST 33 26 25  ALT 15 12 11   ALKPHOS 82 79 72  BILITOT 0.9 1.1 0.9  PROT 5.5* 5.2* 4.7*  ALBUMIN 2.7* 2.5* 2.3*   CBG: Recent Labs  Lab 08/23/21 1703 08/23/21 2133 08/24/21 0310 08/24/21 0724 08/24/21 1218  GLUCAP  92 111* 90 96 133*    Discharge time spent: greater than 30 minutes.  Signed: Estill Cotta, MD [facility] 08/24/2021

## 2021-08-24 NOTE — Progress Notes (Signed)
[facility] ([facility]) Hospital Liaison Note  Notified by TOC manager of patient/family request for [facility] palliative services at home after discharge.   [facility] hospital liaison will follow patient for discharge disposition.   Please call with any hospice or outpatient palliative care related questions.   Thank you for the opportunity to participate in this patient's care.   Shanita Wicker, LCSW [facility] Hospital Liaison [phone]  

## 2021-08-24 NOTE — Evaluation (Signed)
Physical Therapy Evaluation Patient Details Name: Cheryl Jordan MRN: [id number] DOB: July 09, 1941 Today's Date: 08/24/2021  History of Present Illness  Patient is a 81 y.o. 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 intake, nausea and diarrhea. She had recently been discharged from the hospital on 1/27 after she was admitted for AKI on CKD.  Pt found to have COVID-19 on admission.    Clinical Impression  Cheryl Jordan is 81 y.o. female admitted with above HPI and diagnosis. Patient is currently limited by functional impairments below (see PT problem list). Patient lives with her husband and is modified independent with RW/cane for mobility in and out of home at baseline. Patient recently more limited to short household distances for mobility. Currently she requires Min guard for safety with gait using RW. Patient was able to ambulate ~180' with min guard and no LOB. Patient will benefit from continued skilled PT interventions to address impairments and progress independence with mobility, recommending HHPT for follow up. Acute PT will follow and progress as able.        Recommendations for follow up therapy are one component of a multi-disciplinary discharge planning process, led by the attending physician.  Recommendations may be updated based on patient status, additional functional criteria and insurance authorization.  Follow Up Recommendations Home health PT    Assistance Recommended at Discharge Intermittent Supervision/Assistance  Patient can return home with the following  Help with stairs or ramp for entrance;Assist for transportation;Assistance with cooking/housework    Equipment Recommendations Rolling walker (2 wheels);BSC/3in1  Recommendations for Other Services       Functional Status Assessment Patient has had  a recent decline in their functional status and demonstrates the ability to make significant improvements in function in a reasonable and predictable amount of time.     Precautions / Restrictions Precautions Precautions: Fall Restrictions Weight Bearing Restrictions: No      Mobility  Bed Mobility Overal bed mobility: Modified Independent                  Transfers Overall transfer level: Needs assistance Equipment used: Rolling walker (2 wheels) Transfers: Sit to/from Stand Sit to Stand: Min guard           General transfer comment: pt required extra effort and bil UE use for power up. close guarding for safety with rise.    Ambulation/Gait Ambulation/Gait assistance: Min guard, Supervision Gait Distance (Feet): 180 Feet Assistive device: Rolling walker (2 wheels) Gait Pattern/deviations: Step-to pattern, Decreased stride length Gait velocity: decr        Stairs            Wheelchair Mobility    Modified Rankin (Stroke Patients Only)       Balance Overall balance assessment: Mild deficits observed, not formally tested                                           Pertinent Vitals/Pain Pain Assessment Pain Assessment: No/denies pain Pain Intervention(s): Monitored during session    Home Living Family/patient expects to be discharged to:: Private residence Living Arrangements: Spouse/significant other Available Help at Discharge: Family;Available PRN/intermittently (husband is limited in ability to help her, son is available intermittently) Type of Home: House Home Access: Stairs to enter Entrance Stairs-Rails:  None Entrance Stairs-Number of Steps: 1+1   Home Layout: One level;Laundry or work area in [place]: Aten (2 wheels);Cane - single point Additional Comments: husband uses SPC, she uses RW in home    Prior Function Prior Level of Function : Independent/Modified  Independent             Mobility Comments: Uses cane for community mobility, furniture cruises in home or uses RW       Hand Dominance   Dominant Hand: Right    Extremity/Trunk Assessment   Upper Extremity Assessment Upper Extremity Assessment: Generalized weakness    Lower Extremity Assessment Lower Extremity Assessment: Generalized weakness    Cervical / Trunk Assessment Cervical / Trunk Assessment: Normal  Communication   Communication: No difficulties  Cognition Arousal/Alertness: Awake/alert Behavior During Therapy: WFL for tasks assessed/performed Overall Cognitive Status: Within Functional Limits for tasks assessed                                          General Comments      Exercises     Assessment/Plan    PT Assessment Patient needs continued PT services  PT Problem List Decreased strength;Decreased activity tolerance;Decreased mobility;Decreased balance;Decreased knowledge of use of DME;Decreased safety awareness;Decreased knowledge of precautions       PT Treatment Interventions DME instruction;Gait training;Stair training;Functional mobility training;Therapeutic activities;Therapeutic exercise;Balance training;Patient/family education    PT Goals (Current goals can be found in the Care Plan section)  Acute Rehab PT Goals Patient Stated Goal: keep moving independently and get home safe PT Goal Formulation: With patient Time For Goal Achievement: 09/07/21 Potential to Achieve Goals: Good    Frequency Min 3X/week     Co-evaluation               AM-PAC PT "6 Clicks" Mobility  Outcome Measure Help needed turning from your back to your side while in a flat bed without using bedrails?: A Little Help needed moving from lying on your back to sitting on the side of a flat bed without using bedrails?: A Little Help needed moving to and from a bed to a chair (including a wheelchair)?: A Little Help needed standing up from a  chair using your arms (e.g., wheelchair or bedside chair)?: A Little Help needed to walk in hospital room?: A Little Help needed climbing 3-5 steps with a railing? : A Little 6 Click Score: 18    End of Session Equipment Utilized During Treatment: Gait belt Activity Tolerance: Patient tolerated treatment well Patient left: with call bell/phone within reach;in bed;with nursing/sitter in room Nurse Communication: Mobility status PT Visit Diagnosis: Muscle weakness (generalized) (M62.81);Difficulty in walking, not elsewhere classified (R26.2);Unsteadiness on feet (R26.81)    Time: 4970-2637 PT Time Calculation (min) (ACUTE ONLY): 24 min   Charges:   PT Evaluation $PT Eval Low Complexity: 1 Low PT Treatments $Gait Training: 8-22 mins        Verner Mould, DPT Acute Rehabilitation Services Office [phone] Pager [phone]   Jacques Navy 08/24/2021, 2:26 PM

## 2021-08-24 NOTE — Progress Notes (Signed)
Daily Progress Note   Patient Name: Cheryl Jordan       Date: 08/24/2021 DOB: Jun 15, 1941  Age: 81 y.o. MRN#: [id number] Attending Physician: Mendel Corning, MD Primary Care Physician: Wannetta Sender, FNP Admit Date: 08/18/2021  Reason for Consultation/Follow-up: Establishing goals of care  Subjective: Ongoing fatigue/weakness/poor activity tolerance but no other complaints  Length of Stay: 6  Current Medications: Scheduled Meds:   insulin aspart  0-9 Units Subcutaneous TID WC   lactose free nutrition  237 mL Oral TID WC   levothyroxine  88 mcg Oral Daily   multivitamin with minerals  1 tablet Oral Daily   pantoprazole  40 mg Oral Q0600    Continuous Infusions:   PRN Meds: acetaminophen, benzonatate, LORazepam, ondansetron **OR** ondansetron (ZOFRAN) IV  Physical Exam Constitutional:      General: She is not in acute distress.    Appearance: She is ill-appearing.  Pulmonary:     Effort: Pulmonary effort is normal.  Skin:    General: Skin is warm and dry.  Neurological:     Mental Status: She is alert and oriented to person, place, and time.            Vital Signs: BP 105/60 (BP Location: Right Arm)    Pulse 86    Temp 98.3 F (36.8 C)    Resp 17    Ht 5\' 7"  (1.702 m)    Wt 84.4 kg    SpO2 97%    BMI 29.14 kg/m  SpO2: SpO2: 97 % O2 Device: O2 Device: Room Air O2 Flow Rate:    Intake/output summary:  Intake/Output Summary (Last 24 hours) at 08/24/2021 1256 Last data filed at 08/24/2021 1000 Gross per 24 hour  Intake 600 ml  Output 0 ml  Net 600 ml   LBM: Last BM Date: 08/23/21 Baseline Weight: Weight: 84.4 kg Most recent weight: Weight: 84.4 kg       Palliative Assessment/Data: PPS 50%    Flowsheet Rows    Flowsheet Row Most Recent Value  Intake Tab   Referral  Department Hospitalist  Unit at Time of Referral Med/Surg Unit  Palliative Care Primary Diagnosis Other (Comment)  [cirrhosis]  Date Notified 08/22/21  Palliative Care Type New Palliative care  Reason for referral Clarify Goals of Care  Date of Admission 08/18/21  Date first seen by Palliative Care 08/24/21  # of days Palliative referral response time 2 Day(s)  # of days IP prior to Palliative referral 4  Clinical Assessment   Psychosocial & Spiritual Assessment   Palliative Care Outcomes        Patient Active Problem List   Diagnosis Date Noted   Decompensated [name] cirrhosis ([place])    COVID-19 virus infection 08/20/2021   Abdominal ascites 08/20/2021   Failure to thrive in adult 08/18/2021   GAVE (gastric antral vascular ectasia)    Iron deficiency anemia due to chronic blood loss    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HCC)    Portal hypertensive gastropathy (HCC)    Intractable nausea and vomiting 08/09/2021   Pancytopenia ([place]) 08/09/2021   Diarrhea 08/08/2021   Protein-calorie malnutrition, severe 08/08/2021   Hepatoma ([place]) 03/08/2021  Volume overload 07/04/2020   Other cirrhosis of liver (HCC)    Deep venous thrombosis (HCC)    Acute blood loss anemia    Colon ulcer    GI bleed 06/30/2020   GI bleeding 06/16/2020   GIB (gastrointestinal bleeding) 06/15/2020   SOB (shortness of breath) 05/10/2020   Symptomatic anemia 05/10/2020   Enteritis 04/29/2020   Acute renal failure superimposed on stage 3b chronic kidney disease ([place]) 04/29/2020   Abnormal finding on imaging of liver 02/01/2019   Malignant neoplasm of colon ([place]) 08/11/2015   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[place]) 08/11/2015   Adenocarcinoma of colon ([place]) 01/05/2015   Hypertension associated with diabetes ([place]) 12/17/2013   Arthritis 12/17/2013   Hypothyroidism 10/16/2011   GERD (gastroesophageal reflux disease) 10/16/2011    Palliative Care Assessment & Plan   HPI: 81 y.o. female   with past medical history of diabetes mellitus type 2, liver cirrhosis, adenocarcinoma of the colon, recurrent ascites, portal hypertensive gastropathy, esophageal varices, hypothyroidism, and hypertension admitted on 08/18/2021 with poor p.o. intake, nausea and diarrhea. Recently discharged from hospital 1/27 after admission for AKI. This admission, found to be positive for COVID-19 - not requiring oxygen. Patient with MELD score of 24 and requires paracentesis ~ q 8-10 days. GI was consulted for possible TIPS procedure however this was recommended against d/t risk for hepatic encephalopathy. PMT consulted to discuss [place].    Assessment: Feels okay other than fatigue and weakness. Has ambulated in room multiple times today independently.  We reviewed our conversation from yesterday - she has no other questions or concerns. I explained outpatient palliative - she is agreeable to follow up with them. We review code status. Review conversation from yesterday and recommendation for DNR - she agrees DNR is appropriate. Participated in life review - she tells me more about her spouse and things she enjoys to cook. She is hopeful for improvement in fatigue and be able to cook again.  She shares with me about her faith and the peace it gives her as she considers her liver disease.  Recommendations/Plan: Referral to outpatient palliative Code status change to DNR - signed DNR placed in room for dc home  Goals of Care and Additional Recommendations: Limitations on Scope of Treatment: No Artificial Feeding  Code Status: DNR  Discharge Planning: Home with Palliative Services  Care plan was discussed with patient, RN  Thank you for allowing the Palliative Medicine Team to assist in the care of this patient.      Greater than 50%  of this time was spent counseling and coordinating care related to the above assessment and plan.  Juel Burrow, DNP, [facility] Palliative Medicine Team Team Phone #  [phone]  Pager [phone]

## 2021-08-28 NOTE — Telephone Encounter (Signed)
Called the patient's home. Spoke with a female answering the phone.  He states Cheryl Jordan is unable to leave the home. She is going to have home health as far as he is aware. He has not been contacted by any agency yet.  Would a virtual appointment with [facility] be appropriate? Of course that would be providing she had access. She does not have My Chart.  Thanks

## 2021-08-29 ENCOUNTER — Other Ambulatory Visit: Payer: Self-pay

## 2021-08-29 ENCOUNTER — Encounter: Payer: Self-pay | Admitting: *Deleted

## 2021-08-29 ENCOUNTER — Ambulatory Visit
Admission: RE | Admit: 2021-08-29 | Discharge: 2021-08-29 | Disposition: A | Discharge status: Home / Self Care | Payer: Medicare Other | Source: Ambulatory Visit | Attending: Internal Medicine | Admitting: Internal Medicine

## 2021-08-29 DIAGNOSIS — R188 Other ascites: Secondary | ICD-10-CM

## 2021-08-29 HISTORY — PX: IR RADIOLOGIST EVAL & MGMT: IMG5224

## 2021-08-29 NOTE — Consult Note (Signed)
Chief Complaint: Recurrent ascites  Referring Physician(s): Perry,John N  History of Present Illness: Cheryl Jordan is a 81 y.o. 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  I had the pleasure of meeting Cheryl Jordan when she was recently admitted at [facility] on 08/10/21 at the request of Dr. Owens Loffler due to recurrent ascites, grade 2 esophageal varices, portal gastropathy, and GAVE in the setting of melena.   At this time, her total bilirubin was 2.4, creatinine 3.2 (MELD 25).  In the absence of urgent TIPS creation, we decided to revisit the idea of TIPS as an outpatient.  Echo was performed while inpatient which revealed normal right and left heart function.  MRI abdomen was also performed which showed ascites without evidence of biliary obstruction.    After discharge on 08/11/21, she was readmitted to [facility] from 08/18/21 to 08/24/21 with COVID19 and failure to thrive.  She states she is still not feeling great, complaining of nausea and abdominal discomfort today.  She has been wanting to eat but unable to tolerate much by mouth.  No recent vomiting or blood in stool.  She denies any history of prior hepatic encephalopathy. She is not on diuretics due to renal function.  Past Medical History:  Diagnosis Date   Allergy    Anemia    hx of   Anxiety    Aortic stenosis    mild by 04/08/20 echo   Arthritis    Colon cancer ([place]) dx'd 11/2014   Diabetes mellitus without complication (HCC)    TYPE II   Dyspnea    Fatty liver    Gallbladder disease    GERD (gastroesophageal reflux disease)    Heart murmur    History of gout    Hypertension    Hypothyroidism    Kidney disorder    spot on left kidney   Osteoporosis    PONV (postoperative nausea and vomiting)      Past Surgical History:  Procedure Laterality Date   BIOPSY  06/17/2020   Procedure: BIOPSY;  Surgeon: Irving Copas., MD;  Location: [facility];  Service: Gastroenterology;;   CATARACT EXTRACTION Left    CHOLECYSTECTOMY     COLONOSCOPY WITH PROPOFOL N/A 06/17/2020   Procedure: COLONOSCOPY WITH PROPOFOL;  Surgeon: Irving Copas., MD;  Location: [facility];  Service: Gastroenterology;  Laterality: N/A;   COLONOSCOPY WITH PROPOFOL N/A 07/01/2020   Procedure: COLONOSCOPY WITH PROPOFOL;  Surgeon: Mauri Pole, MD;  Location: [facility];  Service: Endoscopy;  Laterality: N/A;   ESOPHAGOGASTRODUODENOSCOPY (EGD) WITH PROPOFOL N/A 06/17/2020   Procedure: ESOPHAGOGASTRODUODENOSCOPY (EGD) WITH PROPOFOL;  Surgeon: Rush Landmark Telford Nab., MD;  Location: [facility];  Service: Gastroenterology;  Laterality: N/A;   ESOPHAGOGASTRODUODENOSCOPY (EGD) WITH PROPOFOL N/A 07/01/2020   Procedure: ESOPHAGOGASTRODUODENOSCOPY (EGD) WITH PROPOFOL;  Surgeon: Mauri Pole, MD;  Location: [facility];  Service: Endoscopy;  Laterality: N/A;   ESOPHAGOGASTRODUODENOSCOPY (EGD) WITH PROPOFOL N/A 08/10/2021   Procedure: ESOPHAGOGASTRODUODENOSCOPY (EGD) WITH PROPOFOL;  Surgeon: Daryel November, MD;  Location: [facility];  Service: Gastroenterology;  Laterality: N/A;   FOREIGN BODY REMOVAL  06/17/2020   Procedure: FOREIGN BODY REMOVAL;  Surgeon: Rush Landmark Telford Nab., MD;  Location: [facility];  Service: Gastroenterology;;   GI RADIOFREQUENCY ABLATION  06/17/2020   Procedure: GI RADIOFREQUENCY ABLATION;  Surgeon: Irving Copas., MD;  Location: [facility];  Service: Gastroenterology;;   HEMOSTASIS CLIP PLACEMENT  06/17/2020   Procedure: HEMOSTASIS CLIP PLACEMENT;  Surgeon: Irving Copas., MD;  Location: [facility];  Service: Gastroenterology;;   HEMOSTASIS CLIP PLACEMENT  07/01/2020   Procedure: HEMOSTASIS CLIP PLACEMENT;  Surgeon: Mauri Pole,  MD;  Location: [facility];  Service: Endoscopy;;   HOT HEMOSTASIS N/A 07/01/2020   Procedure: HOT HEMOSTASIS (ARGON PLASMA COAGULATION/BICAP);  Surgeon: Mauri Pole, MD;  Location: [facility];  Service: Endoscopy;  Laterality: N/A;   HOT HEMOSTASIS  08/10/2021   Procedure: HOT HEMOSTASIS (ARGON PLASMA COAGULATION/BICAP);  Surgeon: Daryel November, MD;  Location: [facility];  Service: Gastroenterology;;   IR PARACENTESIS  08/19/2020   IR PARACENTESIS  09/26/2020   IR PARACENTESIS  11/03/2020   IR PARACENTESIS  11/28/2020   IR PARACENTESIS  12/21/2020   IR PARACENTESIS  01/17/2021   IR PARACENTESIS  02/08/2021   IR PARACENTESIS  02/23/2021   IR PARACENTESIS  03/22/2021   IR PARACENTESIS  04/06/2021   IR PARACENTESIS  04/20/2021   IR PARACENTESIS  05/03/2021   IR PARACENTESIS  05/16/2021   IR PARACENTESIS  05/29/2021   IR PARACENTESIS  06/07/2021   IR PARACENTESIS  06/15/2021   IR PARACENTESIS  06/28/2021   IR PARACENTESIS  07/05/2021   IR PARACENTESIS  07/14/2021   IR PARACENTESIS  07/21/2021   IR PARACENTESIS  08/01/2021   IR RADIOLOGIST EVAL & MGMT  01/24/2017   IR RADIOLOGIST EVAL & MGMT  02/22/2021   IR RADIOLOGIST EVAL & MGMT  03/28/2021   IR RADIOLOGIST EVAL & MGMT  06/06/2021   KNEE CARTILAGE SURGERY Bilateral    NEPHRECTOMY Left 02/10/2015   Procedure: OPEN RETROPERINTONEAL EXPLORATION LEFT RENAL  CYST DECORTICATION X 5;  Surgeon: Cleon Gustin, MD;  Location: [facility];  Service: Urology;  Laterality: Left;   PARTIAL COLECTOMY N/A 02/10/2015   Procedure: OPEN RIGHT  COLECTOMY ;  Surgeon: [name], MD;  Location: [facility];  Service: General;  Laterality: N/A;   POLYPECTOMY  06/17/2020   Procedure: POLYPECTOMY;  Surgeon: Irving Copas., MD;  Location: [facility];  Service: Gastroenterology;;   RADIOLOGY WITH ANESTHESIA N/A 03/08/2021   Procedure: CT MICROWAVE ABLATION WITH ANESTHESIA;  Surgeon: Arne Cleveland, MD;  Location: [facility];  Service: Radiology;   Laterality: N/A;   TUBAL LIGATION      Allergies: Aspirin, Codeine, and Tape  Medications: Prior to Admission medications   Medication Sig Start Date End Date Taking? Authorizing Provider  ACCU-CHEK AVIVA PLUS test strip  10/20/18   [provider]  acetaminophen (TYLENOL) 500 MG tablet Take 500 mg by mouth daily as needed for moderate pain.    [provider]  benzonatate (TESSALON PERLES) 100 MG capsule Take 1 capsule (100 mg total) by mouth 3 (three) times daily as needed for cough. 08/11/21 08/11/22  Lavina Hamman, MD  glipiZIDE (GLUCOTROL XL) 5 MG 24 hr tablet Take 5 mg by mouth daily with breakfast.    [provider]  guaiFENesin-dextromethorphan (ROBITUSSIN DM) 100-10 MG/5ML syrup Take 5 mLs by mouth every 4 (four) hours as needed for cough. Patient not taking: Reported on 08/19/2021 08/11/21   Lavina Hamman, MD  lactose free nutrition (BOOST PLUS) LIQD Take 237 mLs by mouth 3 (three) times daily with meals. Patient not taking: Reported on 08/19/2021 08/11/21   Lavina Hamman, MD  levothyroxine (SYNTHROID) 88 MCG tablet Take 1 tablet (88 mcg total) by mouth daily.  12/04/19   Brunetta Jeans, PA-C  loperamide (IMODIUM) 2 MG capsule Take 2 mg by mouth as needed for diarrhea or loose stools.    [provider]  LORazepam (ATIVAN) 0.5 MG tablet Take 0.5 mg by mouth 2 (two) times daily as needed for anxiety.    [provider]  ondansetron (ZOFRAN) 4 MG tablet Take 1 tablet (4 mg total) by mouth every 8 (eight) hours as needed for nausea or vomiting. 08/24/21 08/24/22  Rai, Vernelle Emerald, MD  pantoprazole (PROTONIX) 40 MG tablet Take 1 tablet (40 mg total) by mouth 2 (two) times daily before a meal. 08/24/21   Rai, Ripudeep K, MD  Simethicone (GAS-X PO) Take 1 tablet by mouth as needed (flatulence).    [provider]  sodium bicarbonate 325 MG tablet Take 1 tablet (325 mg total) by mouth 2 (two) times daily. 08/24/21   Rai, Ripudeep K, MD   sucralfate (CARAFATE) 1 g tablet Take 1 tablet (1 g total) by mouth 2 (two) times daily. 08/24/21   Mendel Corning, MD     Family History  Problem Relation Age of Onset   Diabetes Mother    Hypertension Other    COPD Father    Hypertension Sister    GER disease Sister    Heart attack Brother    COPD Brother    Lung cancer Brother    COPD Brother    Hypertension Brother     Social History   Socioeconomic History   Marital status: Married    Spouse name: Not on file   Number of children: 2   Years of education: Not on file   Highest education level: Not on file  Occupational History   Occupation: Retired  Tobacco Use   Smoking status: Never   Smokeless tobacco: Never  Vaping Use   Vaping Use: Never used  Substance and Sexual Activity   Alcohol use: No   Drug use: No   Sexual activity: Never  Other Topics Concern   Not on file  Social History Narrative   03/03/15-Married, husband Jeneen Rinks   #2 grown sons and #2 grand daughters   Social Determinants of Health   Financial Resource Strain: Not on file  Food Insecurity: Not on file  Transportation Needs: Not on file  Physical Activity: Not on file  Stress: Not on file  Social Connections: Not on file    Review of Systems: A 12 point ROS discussed and pertinent positives are indicated in the HPI above.  All other systems are negative.  Vital Signs: There were no vitals taken for this visit.  No physical examination was performed in lieu of virtual telephone clinic visit.   Imaging: MR abdomen 05/31/21:    Right hepatic vein to right portal vein suitable target for TIPS creation.  LR TR-nonviable right hepatic mass s/p microwave ablation.    Paracenteses: 08/21/21 - 6 L 08/11/21 - 3 L 08/01/21 - 7.2 L 07/21/21 - 7.2 L 07/14/21 - 7.4 L  07/05/21 - 7.4 L 06/28/21 - 8 L 06/15/21 - 6.9 L  Echo 08/11/21 IMPRESSIONS   1. Left ventricular ejection fraction, by estimation, is 65 to 70%. The  left ventricle has normal  function. The left ventricle has no regional  Resendes motion abnormalities. Left ventricular diastolic parameters are  consistent with Grade I diastolic  dysfunction (impaired relaxation).   2. Right ventricular systolic function is normal. The right ventricular  size is normal. Tricuspid regurgitation signal is inadequate  for assessing  PA pressure.    Labs:  CBC: Recent Labs    08/18/21 1527 08/19/21 0535 08/20/21 0454 08/22/21 0446  WBC 4.8 4.0 4.1 4.5  HGB 10.2* 9.7* 8.9* 8.9*  HCT 32.1* 31.1* 28.4* 28.7*  PLT 111* 117* 110* 122*    COAGS: Recent Labs    08/07/21 1510 08/08/21 0221 08/10/21 0457 08/19/21 0535  INR 1.2 1.3* 1.5* 1.3*    BMP: Recent Labs    08/20/21 0454 08/21/21 0416 08/22/21 0446 08/23/21 0430  NA 136 139 138 137  K 3.9 4.0 4.2 4.4  CL 110 114* 115* 114*  CO2 18* 18* 18* 18*  GLUCOSE 108* 113* 102* 95  BUN 50* 45* 46* 48*  CALCIUM 8.1* 8.2* 8.2* 8.2*  CREATININE 2.87* 2.65* 2.72* 2.87*  GFRNONAA 16* 18* 17* 16*    LIVER FUNCTION TESTS: Recent Labs    08/11/21 0523 08/18/21 1527 08/19/21 0535 08/22/21 0446  BILITOT 1.3* 0.9 1.1 0.9  AST 13* 33 26 25  ALT 8 15 12 11   ALKPHOS 65 82 79 72  PROT 4.6* 5.5* 5.2* 4.7*  ALBUMIN 2.6* 2.7* 2.5* 2.3*    TUMOR MARKERS: Recent Labs    01/12/21 1053  CEA 3.77    MELD 3.0 =  21 (93.7% 90 day survival) MELD Na =  19 (6 % 3 month mortality)    Assessment and Plan: LUVERNA DEGENHART is a 81 y.o. 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  From a technical standpoint, she is a candidate for TIPS indicated for recurrent ascites given her anatomy, good heart function, and absence of any prior hepatic  encephalopathy.  Her elevated MELD is primarily driven by her chronic kidney disease and MELD 3.0 by her decreased albumin.  Her renal function is likely in part due to hepatorenal syndrome, which should improve after TIPS.    We discussed her indications as well as her risks, which as with any TIPS creation include hepatic encephalopathy, acutely worsening liver failure, bleeding, and death.  We also discussed long term implications of TIPS and minimization of side effects including lactulose use, TIPS constrainment or revision.  She expresses some frustration that her doctors have told her different opinions regarding the TIPS procedure, and she doesn't know how to best weigh her risks and benefits.  She would like more of a general consensus before deciding what she would like to do.    I will reach out to Dr. Henrene Pastor to discuss further.    Plan to follow up in IR clinic in 1 month for re-assessment.    Raquel Sarna, [place], [place], Sabri S, [place] UC, Matsumoto AH, Angle JF. Effect of transjugular intrahepatic portosystemic shunt placement on renal function: a 7-year, single-center experience. J Vasc Interv Radiol. 2010 Sep;21(9):1370-6. doi: 10.1016/j.jvir.2010.05.009. Epub 2010 Aug 5. PMID: [id number].     Electronically Signed: Suzette Battiest, MD 08/29/2021, 11:55 AM   I spent a total of  60 Minutes  in telephone clinical consultation, greater than 50% of which was counseling/coordinating care for portal hypertension.

## 2021-08-31 ENCOUNTER — Telehealth: Payer: Self-pay

## 2021-08-31 NOTE — Telephone Encounter (Signed)
Spoke with patient's son [name] and scheduled a In-Person Palliative Consult for 09/12/21 @ 3PM.  Consent obtained; updated Outlook/Netsmart/Team List and Epic.

## 2021-09-03 ENCOUNTER — Inpatient Hospital Stay (HOSPITAL_COMMUNITY)
Admission: EM | Admit: 2021-09-03 | Discharge: 2021-09-09 | DRG: 432 | Disposition: A | Discharge status: Home Health | Payer: Medicare Other | Attending: Family Medicine | Admitting: Family Medicine

## 2021-09-03 ENCOUNTER — Other Ambulatory Visit: Payer: Self-pay

## 2021-09-03 ENCOUNTER — Encounter (HOSPITAL_COMMUNITY): Payer: Self-pay

## 2021-09-03 DIAGNOSIS — R531 Weakness: Secondary | ICD-10-CM | POA: Diagnosis not present

## 2021-09-03 DIAGNOSIS — K766 Portal hypertension: Secondary | ICD-10-CM | POA: Diagnosis present

## 2021-09-03 DIAGNOSIS — K767 Hepatorenal syndrome: Secondary | ICD-10-CM | POA: Diagnosis present

## 2021-09-03 DIAGNOSIS — E1169 Type 2 diabetes mellitus with other specified complication: Secondary | ICD-10-CM | POA: Diagnosis not present

## 2021-09-03 DIAGNOSIS — Z8616 Personal history of COVID-19: Secondary | ICD-10-CM

## 2021-09-03 DIAGNOSIS — N179 Acute kidney failure, unspecified: Secondary | ICD-10-CM

## 2021-09-03 DIAGNOSIS — D638 Anemia in other chronic diseases classified elsewhere: Secondary | ICD-10-CM | POA: Diagnosis present

## 2021-09-03 DIAGNOSIS — Z801 Family history of malignant neoplasm of trachea, bronchus and lung: Secondary | ICD-10-CM

## 2021-09-03 DIAGNOSIS — N184 Chronic kidney disease, stage 4 (severe): Secondary | ICD-10-CM | POA: Diagnosis present

## 2021-09-03 DIAGNOSIS — Z66 Do not resuscitate: Secondary | ICD-10-CM | POA: Diagnosis not present

## 2021-09-03 DIAGNOSIS — Z886 Allergy status to analgesic agent status: Secondary | ICD-10-CM

## 2021-09-03 DIAGNOSIS — K7290 Hepatic failure, unspecified without coma: Secondary | ICD-10-CM

## 2021-09-03 DIAGNOSIS — K7460 Unspecified cirrhosis of liver: Secondary | ICD-10-CM | POA: Diagnosis not present

## 2021-09-03 DIAGNOSIS — Z833 Family history of diabetes mellitus: Secondary | ICD-10-CM

## 2021-09-03 DIAGNOSIS — Z885 Allergy status to narcotic agent status: Secondary | ICD-10-CM

## 2021-09-03 DIAGNOSIS — Z79890 Hormone replacement therapy (postmenopausal): Secondary | ICD-10-CM

## 2021-09-03 DIAGNOSIS — D6959 Other secondary thrombocytopenia: Secondary | ICD-10-CM | POA: Diagnosis present

## 2021-09-03 DIAGNOSIS — I129 Hypertensive chronic kidney disease with stage 1 through stage 4 chronic kidney disease, or unspecified chronic kidney disease: Secondary | ICD-10-CM | POA: Diagnosis present

## 2021-09-03 DIAGNOSIS — M810 Age-related osteoporosis without current pathological fracture: Secondary | ICD-10-CM | POA: Diagnosis present

## 2021-09-03 DIAGNOSIS — Z888 Allergy status to other drugs, medicaments and biological substances status: Secondary | ICD-10-CM

## 2021-09-03 DIAGNOSIS — R188 Other ascites: Secondary | ICD-10-CM

## 2021-09-03 DIAGNOSIS — R197 Diarrhea, unspecified: Secondary | ICD-10-CM | POA: Diagnosis present

## 2021-09-03 DIAGNOSIS — E1122 Type 2 diabetes mellitus with diabetic chronic kidney disease: Secondary | ICD-10-CM | POA: Diagnosis present

## 2021-09-03 DIAGNOSIS — K7581 Nonalcoholic steatohepatitis (NASH): Secondary | ICD-10-CM | POA: Diagnosis present

## 2021-09-03 DIAGNOSIS — C220 Liver cell carcinoma: Secondary | ICD-10-CM | POA: Diagnosis present

## 2021-09-03 DIAGNOSIS — D631 Anemia in chronic kidney disease: Secondary | ICD-10-CM | POA: Diagnosis present

## 2021-09-03 DIAGNOSIS — C189 Malignant neoplasm of colon, unspecified: Secondary | ICD-10-CM | POA: Diagnosis present

## 2021-09-03 DIAGNOSIS — F419 Anxiety disorder, unspecified: Secondary | ICD-10-CM | POA: Diagnosis present

## 2021-09-03 DIAGNOSIS — M1990 Unspecified osteoarthritis, unspecified site: Secondary | ICD-10-CM | POA: Diagnosis present

## 2021-09-03 DIAGNOSIS — Z8249 Family history of ischemic heart disease and other diseases of the circulatory system: Secondary | ICD-10-CM

## 2021-09-03 DIAGNOSIS — E43 Unspecified severe protein-calorie malnutrition: Secondary | ICD-10-CM | POA: Diagnosis present

## 2021-09-03 DIAGNOSIS — N1832 Chronic kidney disease, stage 3b: Secondary | ICD-10-CM

## 2021-09-03 DIAGNOSIS — Z6827 Body mass index (BMI) 27.0-27.9, adult: Secondary | ICD-10-CM

## 2021-09-03 DIAGNOSIS — N170 Acute kidney failure with tubular necrosis: Secondary | ICD-10-CM | POA: Diagnosis not present

## 2021-09-03 DIAGNOSIS — A0472 Enterocolitis due to Clostridium difficile, not specified as recurrent: Secondary | ICD-10-CM | POA: Diagnosis present

## 2021-09-03 DIAGNOSIS — I350 Nonrheumatic aortic (valve) stenosis: Secondary | ICD-10-CM | POA: Diagnosis present

## 2021-09-03 DIAGNOSIS — K219 Gastro-esophageal reflux disease without esophagitis: Secondary | ICD-10-CM | POA: Diagnosis present

## 2021-09-03 DIAGNOSIS — E872 Acidosis, unspecified: Secondary | ICD-10-CM | POA: Diagnosis present

## 2021-09-03 DIAGNOSIS — E785 Hyperlipidemia, unspecified: Secondary | ICD-10-CM | POA: Diagnosis present

## 2021-09-03 DIAGNOSIS — E039 Hypothyroidism, unspecified: Secondary | ICD-10-CM | POA: Diagnosis present

## 2021-09-03 DIAGNOSIS — Z85038 Personal history of other malignant neoplasm of large intestine: Secondary | ICD-10-CM

## 2021-09-03 DIAGNOSIS — Z825 Family history of asthma and other chronic lower respiratory diseases: Secondary | ICD-10-CM

## 2021-09-03 DIAGNOSIS — Z79899 Other long term (current) drug therapy: Secondary | ICD-10-CM

## 2021-09-03 DIAGNOSIS — Z7984 Long term (current) use of oral hypoglycemic drugs: Secondary | ICD-10-CM

## 2021-09-03 LAB — CBC WITH DIFFERENTIAL/PLATELET
Abs Immature Granulocytes: 0.01 10*3/uL (ref 0.00–0.07)
Basophils Absolute: 0 10*3/uL (ref 0.0–0.1)
Basophils Relative: 0 %
Eosinophils Absolute: 0.1 10*3/uL (ref 0.0–0.5)
Eosinophils Relative: 1 %
HCT: 33.9 % — ABNORMAL LOW (ref 36.0–46.0)
Hemoglobin: 10.6 g/dL — ABNORMAL LOW (ref 12.0–15.0)
Immature Granulocytes: 0 %
Lymphocytes Relative: 23 %
Lymphs Abs: 1.1 10*3/uL (ref 0.7–4.0)
MCH: 28.9 pg (ref 26.0–34.0)
MCHC: 31.3 g/dL (ref 30.0–36.0)
MCV: 92.4 fL (ref 80.0–100.0)
Monocytes Absolute: 0.2 10*3/uL (ref 0.1–1.0)
Monocytes Relative: 5 %
Neutro Abs: 3.3 10*3/uL (ref 1.7–7.7)
Neutrophils Relative %: 71 %
Platelets: 98 10*3/uL — ABNORMAL LOW (ref 150–400)
RBC: 3.67 MIL/uL — ABNORMAL LOW (ref 3.87–5.11)
RDW: 16.2 % — ABNORMAL HIGH (ref 11.5–15.5)
WBC: 4.8 10*3/uL (ref 4.0–10.5)
nRBC: 0 % (ref 0.0–0.2)

## 2021-09-03 LAB — COMPREHENSIVE METABOLIC PANEL
ALT: 13 U/L (ref 0–44)
AST: 29 U/L (ref 15–41)
Albumin: 2.6 g/dL — ABNORMAL LOW (ref 3.5–5.0)
Alkaline Phosphatase: 119 U/L (ref 38–126)
Anion gap: 9 (ref 5–15)
BUN: 48 mg/dL — ABNORMAL HIGH (ref 8–23)
CO2: 18 mmol/L — ABNORMAL LOW (ref 22–32)
Calcium: 8.8 mg/dL — ABNORMAL LOW (ref 8.9–10.3)
Chloride: 112 mmol/L — ABNORMAL HIGH (ref 98–111)
Creatinine, Ser: 3.37 mg/dL — ABNORMAL HIGH (ref 0.44–1.00)
GFR, Estimated: 13 mL/min — ABNORMAL LOW (ref >60)
Glucose, Bld: 110 mg/dL — ABNORMAL HIGH (ref 70–99)
Potassium: 5.1 mmol/L (ref 3.5–5.1)
Sodium: 139 mmol/L (ref 135–145)
Total Bilirubin: 1.7 mg/dL — ABNORMAL HIGH (ref 0.3–1.2)
Total Protein: 5.8 g/dL — ABNORMAL LOW (ref 6.5–8.1)

## 2021-09-03 LAB — PROTIME-INR
INR: 1.2 (ref 0.8–1.2)
Prothrombin Time: 15.5 seconds — ABNORMAL HIGH (ref 11.4–15.2)

## 2021-09-03 LAB — MAGNESIUM: Magnesium: 1.9 mg/dL (ref 1.7–2.4)

## 2021-09-03 LAB — LACTIC ACID, PLASMA
Lactic Acid, Venous: 1.5 mmol/L (ref 0.5–1.9)
Lactic Acid, Venous: 1.2 mmol/L (ref 0.5–1.9)

## 2021-09-03 MED ORDER — SUCRALFATE 1 G PO TABS
1.0000 g | ORAL_TABLET | Freq: Two times a day (BID) | ORAL | Status: DC
  Administered 2021-09-03 – 2021-09-09 (×12): 1 g via ORAL
  Filled 2021-09-03 (×12): qty 1

## 2021-09-03 MED ORDER — ONDANSETRON HCL 4 MG/2ML IJ SOLN
4.0000 mg | Freq: Four times a day (QID) | INTRAMUSCULAR | Status: DC | PRN
  Administered 2021-09-03 – 2021-09-06 (×5): 4 mg via INTRAVENOUS
  Filled 2021-09-03 (×4): qty 2

## 2021-09-03 MED ORDER — BENZONATATE 100 MG PO CAPS
100.0000 mg | ORAL_CAPSULE | Freq: Three times a day (TID) | ORAL | Status: DC | PRN
  Administered 2021-09-03 – 2021-09-05 (×3): 100 mg via ORAL
  Filled 2021-09-03 (×3): qty 1

## 2021-09-03 MED ORDER — ALBUMIN HUMAN 25 % IV SOLN
12.5000 g | Freq: Four times a day (QID) | INTRAVENOUS | Status: DC
  Administered 2021-09-03 – 2021-09-04 (×3): 12.5 g via INTRAVENOUS
  Filled 2021-09-03 (×3): qty 50

## 2021-09-03 MED ORDER — PANTOPRAZOLE SODIUM 40 MG PO TBEC
40.0000 mg | DELAYED_RELEASE_TABLET | Freq: Two times a day (BID) | ORAL | Status: DC
  Administered 2021-09-03 – 2021-09-09 (×12): 40 mg via ORAL
  Filled 2021-09-03 (×12): qty 1

## 2021-09-03 MED ORDER — ACETAMINOPHEN 325 MG PO TABS
650.0000 mg | ORAL_TABLET | Freq: Four times a day (QID) | ORAL | Status: DC | PRN
  Administered 2021-09-04 (×2): 650 mg via ORAL
  Filled 2021-09-03 (×3): qty 2

## 2021-09-03 MED ORDER — LEVOTHYROXINE SODIUM 88 MCG PO TABS
88.0000 ug | ORAL_TABLET | Freq: Every day | ORAL | Status: DC
  Administered 2021-09-04 – 2021-09-09 (×6): 88 ug via ORAL
  Filled 2021-09-03 (×6): qty 1

## 2021-09-03 MED ORDER — ACETAMINOPHEN 650 MG RE SUPP: 650.0000 mg | Freq: Four times a day (QID) | RECTAL | Status: DC | PRN

## 2021-09-03 MED ORDER — MELATONIN 3 MG PO TABS
6.0000 mg | ORAL_TABLET | Freq: Every day | ORAL | Status: DC
Start: 2021-09-03 — End: 2021-09-09
  Administered 2021-09-03 – 2021-09-08 (×5): 6 mg via ORAL
  Filled 2021-09-03 (×6): qty 2

## 2021-09-03 MED ORDER — SODIUM CHLORIDE 0.9 % IV BOLUS
500.0000 mL | Freq: Once | INTRAVENOUS | Status: AC
  Administered 2021-09-03: 500 mL via INTRAVENOUS

## 2021-09-03 MED ORDER — ONDANSETRON HCL 4 MG PO TABS
4.0000 mg | ORAL_TABLET | Freq: Four times a day (QID) | ORAL | Status: DC | PRN
  Administered 2021-09-06: 4 mg via ORAL
  Filled 2021-09-03: qty 1

## 2021-09-03 MED ORDER — ENOXAPARIN SODIUM 30 MG/0.3ML IJ SOSY
30.0000 mg | PREFILLED_SYRINGE | INTRAMUSCULAR | Status: DC
  Administered 2021-09-03 – 2021-09-05 (×3): 30 mg via SUBCUTANEOUS
  Filled 2021-09-03 (×3): qty 0.3

## 2021-09-03 MED ORDER — ONDANSETRON HCL 4 MG/2ML IJ SOLN
4.0000 mg | Freq: Once | INTRAMUSCULAR | Status: AC
Start: 2021-09-03 — End: 2021-09-03
  Administered 2021-09-03: 4 mg via INTRAVENOUS
  Filled 2021-09-03: qty 2

## 2021-09-03 MED ORDER — SODIUM BICARBONATE 650 MG PO TABS
325.0000 mg | ORAL_TABLET | Freq: Two times a day (BID) | ORAL | Status: DC
  Administered 2021-09-03 – 2021-09-07 (×9): 325 mg via ORAL
  Filled 2021-09-03 (×9): qty 1

## 2021-09-03 NOTE — H&P (Signed)
History and Physical    [name] WJX:[id number] DOB: 01-12-1941 DOA: 09/03/2021 DOS: the patient was seen and examined on 09/03/2021 PCP: Wannetta Sender, FNP  Patient coming from: Home  Chief Complaint: generalized weakness  Chief Complaint  Patient presents with   Weakness    HPI: Cheryl Jordan is a 81 y.o. female with medical history significant of liver cirrhosis due to NASH, colon cancer, CKD stage 3b, T2DM, severe protein calorie malnutrition, who had a recent hospitalization 08/18/21 to 08/24/21 for SARS COVID 19 infection and decompensated liver cirrhosis. She was discharged home with home health services.   At home patient continue with progressive weakness, to the point she was not able to further ambulate independently. Very poor oral intake and worsening abdominal distention. Over last 2 to 3 days has been experiencing diarrhea that has been refractive to imodium.   Because of worsening weakness she was brought to the hospital for further evaluation. Denies any abdominal pain, nausea or vomiting, no dyspnea or chest pain.    Review of Systems: As mentioned in the history of present illness. All other systems reviewed and are negative. Past Medical History:  Diagnosis Date   Allergy    Anemia    hx of   Anxiety    Aortic stenosis    mild by 04/08/20 echo   Arthritis    Colon cancer ([place]) dx'd 11/2014   Diabetes mellitus without complication (HCC)    TYPE II   Dyspnea    Fatty liver    Gallbladder disease    GERD (gastroesophageal reflux disease)    Heart murmur    History of gout    Hypertension    Hypothyroidism    Kidney disorder    spot on left kidney   Osteoporosis    PONV (postoperative nausea and vomiting)    Past Surgical History:  Procedure Laterality Date   BIOPSY  06/17/2020   Procedure: BIOPSY;  Surgeon: Irving Copas., MD;  Location: [facility];  Service: Gastroenterology;;   CATARACT EXTRACTION Left     CHOLECYSTECTOMY     COLONOSCOPY WITH PROPOFOL N/A 06/17/2020   Procedure: COLONOSCOPY WITH PROPOFOL;  Surgeon: Irving Copas., MD;  Location: [facility];  Service: Gastroenterology;  Laterality: N/A;   COLONOSCOPY WITH PROPOFOL N/A 07/01/2020   Procedure: COLONOSCOPY WITH PROPOFOL;  Surgeon: Mauri Pole, MD;  Location: [facility];  Service: Endoscopy;  Laterality: N/A;   ESOPHAGOGASTRODUODENOSCOPY (EGD) WITH PROPOFOL N/A 06/17/2020   Procedure: ESOPHAGOGASTRODUODENOSCOPY (EGD) WITH PROPOFOL;  Surgeon: Rush Landmark Telford Nab., MD;  Location: [facility];  Service: Gastroenterology;  Laterality: N/A;   ESOPHAGOGASTRODUODENOSCOPY (EGD) WITH PROPOFOL N/A 07/01/2020   Procedure: ESOPHAGOGASTRODUODENOSCOPY (EGD) WITH PROPOFOL;  Surgeon: Mauri Pole, MD;  Location: [facility];  Service: Endoscopy;  Laterality: N/A;   ESOPHAGOGASTRODUODENOSCOPY (EGD) WITH PROPOFOL N/A 08/10/2021   Procedure: ESOPHAGOGASTRODUODENOSCOPY (EGD) WITH PROPOFOL;  Surgeon: Daryel November, MD;  Location: [facility];  Service: Gastroenterology;  Laterality: N/A;   FOREIGN BODY REMOVAL  06/17/2020   Procedure: FOREIGN BODY REMOVAL;  Surgeon: Rush Landmark Telford Nab., MD;  Location: [facility];  Service: Gastroenterology;;   GI RADIOFREQUENCY ABLATION  06/17/2020   Procedure: GI RADIOFREQUENCY ABLATION;  Surgeon: Irving Copas., MD;  Location: [facility];  Service: Gastroenterology;;   HEMOSTASIS CLIP PLACEMENT  06/17/2020   Procedure: HEMOSTASIS CLIP PLACEMENT;  Surgeon: Irving Copas., MD;  Location: [facility];  Service: Gastroenterology;;   HEMOSTASIS CLIP PLACEMENT  07/01/2020   Procedure:  HEMOSTASIS CLIP PLACEMENT;  Surgeon: Mauri Pole, MD;  Location: [facility];  Service: Endoscopy;;   HOT HEMOSTASIS N/A 07/01/2020   Procedure: HOT HEMOSTASIS (ARGON PLASMA COAGULATION/BICAP);  Surgeon: Mauri Pole, MD;  Location: [facility];  Service: Endoscopy;   Laterality: N/A;   HOT HEMOSTASIS  08/10/2021   Procedure: HOT HEMOSTASIS (ARGON PLASMA COAGULATION/BICAP);  Surgeon: Daryel November, MD;  Location: [facility];  Service: Gastroenterology;;   IR PARACENTESIS  08/19/2020   IR PARACENTESIS  09/26/2020   IR PARACENTESIS  11/03/2020   IR PARACENTESIS  11/28/2020   IR PARACENTESIS  12/21/2020   IR PARACENTESIS  01/17/2021   IR PARACENTESIS  02/08/2021   IR PARACENTESIS  02/23/2021   IR PARACENTESIS  03/22/2021   IR PARACENTESIS  04/06/2021   IR PARACENTESIS  04/20/2021   IR PARACENTESIS  05/03/2021   IR PARACENTESIS  05/16/2021   IR PARACENTESIS  05/29/2021   IR PARACENTESIS  06/07/2021   IR PARACENTESIS  06/15/2021   IR PARACENTESIS  06/28/2021   IR PARACENTESIS  07/05/2021   IR PARACENTESIS  07/14/2021   IR PARACENTESIS  07/21/2021   IR PARACENTESIS  08/01/2021   IR RADIOLOGIST EVAL & MGMT  01/24/2017   IR RADIOLOGIST EVAL & MGMT  02/22/2021   IR RADIOLOGIST EVAL & MGMT  03/28/2021   IR RADIOLOGIST EVAL & MGMT  06/06/2021   IR RADIOLOGIST EVAL & MGMT  08/29/2021   KNEE CARTILAGE SURGERY Bilateral    NEPHRECTOMY Left 02/10/2015   Procedure: OPEN RETROPERINTONEAL EXPLORATION LEFT RENAL  CYST DECORTICATION X 5;  Surgeon: Cleon Gustin, MD;  Location: [facility];  Service: Urology;  Laterality: Left;   PARTIAL COLECTOMY N/A 02/10/2015   Procedure: OPEN RIGHT  COLECTOMY ;  Surgeon: [name], MD;  Location: [facility];  Service: General;  Laterality: N/A;   POLYPECTOMY  06/17/2020   Procedure: POLYPECTOMY;  Surgeon: Irving Copas., MD;  Location: [facility];  Service: Gastroenterology;;   RADIOLOGY WITH ANESTHESIA N/A 03/08/2021   Procedure: CT MICROWAVE ABLATION WITH ANESTHESIA;  Surgeon: Arne Cleveland, MD;  Location: [facility];  Service: Radiology;  Laterality: N/A;   TUBAL LIGATION     Social History:  reports that she has never smoked. She has never used smokeless tobacco. She reports that she does not drink alcohol and does  not use drugs.  Allergies  Allergen Reactions   Aspirin Other (See Comments)    Runny nose    Codeine Nausea And Vomiting   Tape Other (See Comments)    Unnamed "medical tape" BLISTERS THE SKIN!!    Family History  Problem Relation Age of Onset   Diabetes Mother    Hypertension Other    COPD Father    Hypertension Sister    GER disease Sister    Heart attack Brother    COPD Brother    Lung cancer Brother    COPD Brother    Hypertension Brother     Prior to Admission medications   Medication Sig Start Date End Date Taking? Authorizing Provider  ACCU-CHEK AVIVA PLUS test strip  10/20/18   [provider]  acetaminophen (TYLENOL) 500 MG tablet Take 500 mg by mouth daily as needed for moderate pain.    [provider]  benzonatate (TESSALON PERLES) 100 MG capsule Take 1 capsule (100 mg total) by mouth 3 (three) times daily as needed for cough. 08/11/21 08/11/22  Lavina Hamman, MD  glipiZIDE (GLUCOTROL XL) 5 MG 24 hr tablet Take  5 mg by mouth daily with breakfast.    [provider]  guaiFENesin-dextromethorphan (ROBITUSSIN DM) 100-10 MG/5ML syrup Take 5 mLs by mouth every 4 (four) hours as needed for cough. Patient not taking: Reported on 08/19/2021 08/11/21   Lavina Hamman, MD  lactose free nutrition (BOOST PLUS) LIQD Take 237 mLs by mouth 3 (three) times daily with meals. Patient not taking: Reported on 08/19/2021 08/11/21   Lavina Hamman, MD  levothyroxine (SYNTHROID) 88 MCG tablet Take 1 tablet (88 mcg total) by mouth daily. 12/04/19   Brunetta Jeans, PA-C  loperamide (IMODIUM) 2 MG capsule Take 2 mg by mouth as needed for diarrhea or loose stools.    [provider]  LORazepam (ATIVAN) 0.5 MG tablet Take 0.5 mg by mouth 2 (two) times daily as needed for anxiety.    [provider]  ondansetron (ZOFRAN) 4 MG tablet Take 1 tablet (4 mg total) by mouth every 8 (eight) hours as needed for nausea or vomiting. 08/24/21 08/24/22  Rai, Vernelle Emerald,  MD  pantoprazole (PROTONIX) 40 MG tablet Take 1 tablet (40 mg total) by mouth 2 (two) times daily before a meal. 08/24/21   Rai, Ripudeep K, MD  Simethicone (GAS-X PO) Take 1 tablet by mouth as needed (flatulence).    [provider]  sodium bicarbonate 325 MG tablet Take 1 tablet (325 mg total) by mouth 2 (two) times daily. 08/24/21   Rai, Ripudeep K, MD  sodium bicarbonate 650 MG tablet Take 325 mg by mouth 2 (two) times daily. 08/24/21   [provider]  sucralfate (CARAFATE) 1 g tablet Take 1 tablet (1 g total) by mouth 2 (two) times daily. 08/24/21   Mendel Corning, MD    Physical Exam: Vitals:   09/03/21 1415 09/03/21 1430 09/03/21 1445 09/03/21 1500  BP:  140/74  (!) 144/70  Pulse: 93 82 90 91  Resp: 16 17 (!) 21 (!) 21  Temp:      TempSrc:      SpO2: 94% 95% 96% 96%  Weight:      Height:       Neurology patient very weak and deconditioned, awake and alert ENT positive pallor and dry oral mucosa Cardiovascular with S1 and S2 present and rhythmic, no gallops or murmurs Neck with no JVD Respiratory with no rales or rhonchi, no wheezing Abdomen distended, positive ascites, non tender and no palpable masses.  No lower extremity edema   Data Reviewed:   Cheryl Jordan will be admitted to the hospital with the working diagnosis of decompensated liver failure.   81 yo female with advance liver cirrhosis due to NASH, complicated with portal hypertension and hepatorenal syndrome, recurrent ascites who had a recent hospitalization early this month for COVID 19 viral infection and decompensated liver disease, presents with worsening weakness, poor oral intake, worsening ascites and diarrhea. On her initial physical examination she is ill looking appearing, very weak and deconditioned. Her blood pressure is 140/72, HR 100, RR 16 and 02 saturation 95% on room air. Pale with dry mucous membranes, lungs with no rales, heart with S1 and S2 present and rhythmic, abdomen distended with  positive ascites, non tender and no lower extremity edema.   Na 139, K is 5,1 CL 112, bicarb 18, glucose 110, BUN 48 and cr 3,37 Albumin is 1,7 Wbc 4,8, hgb 10,6, hct 33,9, plt 38   Ekg 96 bpm, with normal axis and normal intervals, sinus rhythm with no significant ST segment and  T wave changes.   Assessment and Plan: * Weakness Multifactorial generalized weakness, patient is non focal on neuro exam.   Patient with advance liver disease with worsening ascites, along with recent COVID 19 viral infection.  Poor oral intake.  Plan for supportive medical therapy including IV fluids and treatment for her decompensated liver disease.  Consult nutrition, PT and OT.   Decompensated hepatic cirrhosis ([place])- (present on admission) Child Pugh B with MELD 19. Hepatocellular carcinoma.  Patient with recurrent ascites.  Old records personally IR evaluation last hospitalization she was found candidate for TIPS, but she did request more time before consenting for the procedure.  She also had 6 L paracentesis.  Has palliative care services as outpatient.   Clinically she is not encephalopathic today, but clear worsening ascites. Plan to consult IR for repeat paracentesis.   Acute renal failure superimposed on stage 3b chronic kidney disease ([place])- (present on admission) Renal function with serum cr at 3,37 with K at 5,1 and serum bicarbonate at 18, Patient had diagnosis of hepatorenal syndrome in the past hospitalization.  Plan to add IV albumin 4 doses and close follow up on renal function.  Avoid hypotension and nephrotoxic medications,   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[place])- (present on admission) Glucose is 110, patient with very poor oral intake. Add insulin sliding scale for glucose cover and monitoring.   GERD (gastroesophageal reflux disease)- (present on admission) Continue with antiacid therapy   Hypothyroidism- (present on admission) Continue with levothyroxine    Protein-calorie malnutrition, severe- (present on admission) Follow with nutrition for recommendations.   Adenocarcinoma of colon ([place])- (present on admission) Patient follows with oncology as outpatient   Diarrhea- (present on admission) Will check c diff, patient with recent hospitalization. She is not on lactulose at home        Advance Care Planning:   Code Status: Prior DNR   Consults: None   Family Communication: no family at the bedside   Severity of Illness: The appropriate patient status for this patient is OBSERVATION. Observation status is judged to be reasonable and necessary in order to provide the required intensity of service to ensure the patient's safety. The patient's presenting symptoms, physical exam findings, and initial radiographic and laboratory data in the context of their medical condition is felt to place them at decreased risk for further clinical deterioration. Furthermore, it is anticipated that the patient will be medically stable for discharge from the hospital within 2 midnights of admission.   Author: Tawni Millers, MD 09/03/2021 4:08 PM  For on call review [email or web address].

## 2021-09-03 NOTE — Assessment & Plan Note (Addendum)
-  Chronic thrombocytopenia; due to chronic liver disease.  Continue avoiding heparin products. -Plan is to transfuse for hemoglobin less than 7 -Continue to follow hemoglobin trend. -No signs of acute bleeding. 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

## 2021-09-03 NOTE — Assessment & Plan Note (Addendum)
-  Child Pugh B with MELD 19. Hepatocellular carcinoma.  -Patient with recurrent ascites.  -IR evaluation last hospitalization she was found candidate for TIPS, but she did request more time before consenting for the procedure. -Status post 6 L ascitic fluid removal with paracentesis. -GI service has been consulted -Will follow further recommendations. -Continue treatment with IV albumin for now.  -Overall prognosis is poor.  -Normal WBCs appreciated on examination.

## 2021-09-03 NOTE — Assessment & Plan Note (Addendum)
Continue with levothyroxine  

## 2021-09-03 NOTE — Assessment & Plan Note (Addendum)
-  Continue outpatient follow-up with oncology service. 

## 2021-09-03 NOTE — ED Triage Notes (Signed)
Pt brought to ED via [facility] for generalized weakness, decreased appetite, and fatigue. Pt had paracentesis at [facility] and was discharged 2/9.

## 2021-09-03 NOTE — Assessment & Plan Note (Addendum)
-   Continue to follow CBGs and further adjust hypoglycemic regimen as needed. -While inpatient continue sliding scale insulin.

## 2021-09-03 NOTE — Assessment & Plan Note (Addendum)
-  Couple overnight loose stools reported -Complaining of nausea. -Stool results from admission with positive C. difficile antigen but negative toxin -Discussed with GI service and will wait on C. difficile PCR results to determine need for treatment. -Florastor twice daily has been added to assist with loose stools. -Appears to be colonization and no acute infection -Continue supportive care.

## 2021-09-03 NOTE — Plan of Care (Signed)
  Problem: Nutrition: Goal: Adequate nutrition will be maintained Outcome: Progressing   Problem: Elimination: Goal: Will not experience complications related to bowel motility Outcome: Progressing   Problem: Pain Managment: Goal: General experience of comfort will improve Outcome: Progressing   

## 2021-09-03 NOTE — Assessment & Plan Note (Addendum)
-  Continue with antiacid therapy  -Patient reports no reflux symptoms currently.

## 2021-09-03 NOTE — Assessment & Plan Note (Addendum)
-  Renal function with serum cr at 3,37 with K at 5,1 and serum bicarbonate at 18 at time of admission. -Creatinine has improved to 3.20 and her potassium is stable in the 4 range currently.  Continue to demonstrate metabolic acidosis in the setting of chronic renal failure.  Bicarb 19. -Continue treatment with IV albumin infusion -Gastroenterology service has been consulted. -Overall prognosis is poor; palliative care has also been involved. -Minimize/avoid as much as possible the use of nephrotoxic agents and contrast. -Avoid also hypotension.

## 2021-09-03 NOTE — Assessment & Plan Note (Addendum)
-  Follow with nutrition for recommendations for feeding supplements.  -In the setting of chronic illness and decreased oral intake.

## 2021-09-03 NOTE — ED Provider Notes (Signed)
[facility] EMERGENCY DEPARTMENT Provider Note   CSN: [id number] Arrival date & time: 09/03/21  1138     History  Chief Complaint  Patient presents with   Weakness    Cheryl Jordan is a 81 y.o. female.  Patient presents to ER chief complaint of generalized fatigue generalized weakness decreased appetite and nausea.  She had several admissions for similar episodes in the past 2 months.  Last episode was February 9.  During this time she had 6 L removed from her abdomen and ultimately was discharged home.  Patient and family states that she had initially been doing okay at home using her walker and ambulating.  However the her symptoms have worsened over the course of the last week.  She has been bed bound for the past couple of days refusing to eat complaining of persistent nausea.  She denies any headache or chest pain or abdominal pain no reports of vomiting or reports of fevers.      Home Medications Prior to Admission medications   Medication Sig Start Date End Date Taking? Authorizing Provider  ACCU-CHEK AVIVA PLUS test strip  10/20/18   [provider]  acetaminophen (TYLENOL) 500 MG tablet Take 500 mg by mouth daily as needed for moderate pain.    [provider]  benzonatate (TESSALON PERLES) 100 MG capsule Take 1 capsule (100 mg total) by mouth 3 (three) times daily as needed for cough. 08/11/21 08/11/22  Lavina Hamman, MD  glipiZIDE (GLUCOTROL XL) 5 MG 24 hr tablet Take 5 mg by mouth daily with breakfast.    [provider]  guaiFENesin-dextromethorphan (ROBITUSSIN DM) 100-10 MG/5ML syrup Take 5 mLs by mouth every 4 (four) hours as needed for cough. Patient not taking: Reported on 08/19/2021 08/11/21   Lavina Hamman, MD  lactose free nutrition (BOOST PLUS) LIQD Take 237 mLs by mouth 3 (three) times daily with meals. Patient not taking: Reported on 08/19/2021 08/11/21   Lavina Hamman, MD  levothyroxine (SYNTHROID) 88 MCG tablet Take 1 tablet (88 mcg  total) by mouth daily. 12/04/19   Brunetta Jeans, PA-C  loperamide (IMODIUM) 2 MG capsule Take 2 mg by mouth as needed for diarrhea or loose stools.    [provider]  LORazepam (ATIVAN) 0.5 MG tablet Take 0.5 mg by mouth 2 (two) times daily as needed for anxiety.    [provider]  ondansetron (ZOFRAN) 4 MG tablet Take 1 tablet (4 mg total) by mouth every 8 (eight) hours as needed for nausea or vomiting. 08/24/21 08/24/22  Rai, Vernelle Emerald, MD  pantoprazole (PROTONIX) 40 MG tablet Take 1 tablet (40 mg total) by mouth 2 (two) times daily before a meal. 08/24/21   Rai, Ripudeep K, MD  Simethicone (GAS-X PO) Take 1 tablet by mouth as needed (flatulence).    [provider]  sodium bicarbonate 325 MG tablet Take 1 tablet (325 mg total) by mouth 2 (two) times daily. 08/24/21   Rai, Ripudeep K, MD  sucralfate (CARAFATE) 1 g tablet Take 1 tablet (1 g total) by mouth 2 (two) times daily. 08/24/21   Rai, Vernelle Emerald, MD      Allergies    Aspirin, Codeine, and Tape    Review of Systems   Review of Systems  Constitutional:  Negative for fever.  HENT:  Negative for ear pain.   Eyes:  Negative for pain.  Respiratory:  Negative for cough.   Cardiovascular:  Negative for chest pain.  Gastrointestinal:  Negative for abdominal pain.  Genitourinary:  Negative for flank pain.  Musculoskeletal:  Negative for back pain.  Skin:  Negative for rash.  Neurological:  Negative for headaches.   Physical Exam Updated Vital Signs BP 140/74    Pulse 82    Temp 98 F (36.7 C) (Oral)    Resp 17    Ht 5\' 7"  (1.702 m)    Wt 84.4 kg    SpO2 95%    BMI 29.14 kg/m  Physical Exam Constitutional:      General: She is not in acute distress.    Appearance: Normal appearance.  HENT:     Head: Normocephalic.     Nose: Nose normal.  Eyes:     Extraocular Movements: Extraocular movements intact.  Cardiovascular:     Rate and Rhythm: Normal rate.  Pulmonary:     Effort: Pulmonary effort is normal.   Abdominal:     Comments: Moderately distended abdomen, soft.  No tenderness or guarding.  Positive fluid wave.  Musculoskeletal:        General: Normal range of motion.     Cervical back: Normal range of motion.  Neurological:     General: No focal deficit present.     Mental Status: She is alert and oriented to person, place, and time. Mental status is at baseline.     Cranial Nerves: No cranial nerve deficit.     Motor: Weakness present.     Gait: Gait normal.     Comments: Nonfocal generalized weakness present.    ED Results / Procedures / Treatments   Labs (all labs ordered are listed, but only abnormal results are displayed) Labs Reviewed  CBC WITH DIFFERENTIAL/PLATELET - Abnormal; Notable for the following components:      Result Value   RBC 3.67 (*)    Hemoglobin 10.6 (*)    HCT 33.9 (*)    RDW 16.2 (*)    Platelets 98 (*)    All other components within normal limits  COMPREHENSIVE METABOLIC PANEL - Abnormal; Notable for the following components:   Chloride 112 (*)    CO2 18 (*)    Glucose, Bld 110 (*)    BUN 48 (*)    Creatinine, Ser 3.37 (*)    Calcium 8.8 (*)    Total Protein 5.8 (*)    Albumin 2.6 (*)    Total Bilirubin 1.7 (*)    GFR, Estimated 13 (*)    All other components within normal limits  PROTIME-INR - Abnormal; Notable for the following components:   Prothrombin Time 15.5 (*)    All other components within normal limits  CULTURE, BLOOD (ROUTINE X 2)  CULTURE, BLOOD (ROUTINE X 2)  LACTIC ACID, PLASMA  LACTIC ACID, PLASMA  MAGNESIUM  URINALYSIS, ROUTINE W REFLEX MICROSCOPIC    EKG EKG Interpretation  Date/Time:  Sunday September 03 2021 12:28:06 EST Ventricular Rate:  96 PR Interval:  169 QRS Duration: 77 QT Interval:  349 QTC Calculation: 441 R Axis:   13 Text Interpretation: Sinus rhythm Atrial premature complex Borderline low voltage, extremity leads Confirmed by Thamas Jaegers ([id number]) on 09/03/2021 1:04:38 PM  Radiology No results  found.  Procedures Procedures    Medications Ordered in ED Medications  ondansetron (ZOFRAN) injection 4 mg (4 mg Intravenous Given 09/03/21 1344)  sodium chloride 0.9 % bolus 500 mL (500 mLs Intravenous Bolus from Bag 09/03/21 1419)    ED Course/ Medical Decision Making/ A&P  Medical Decision Making Amount and/or Complexity of Data Reviewed Labs: ordered.  Risk Prescription drug management.   Chart review shows multiple prior visits.  Patient was admitted January 23 as well as February 3 and ultimately discharged February 9.  Labs today show moderate renal insufficiency otherwise unremarkable labs compared to prior results.  Patient has no abdominal pain no tenderness no fevers I doubt SBP.  She is mentating well otherwise.  Given 500 cc bolus of fluid resuscitation.  Case discussed with hospitalist for admission for failure to thrive.        Final Clinical Impression(s) / ED Diagnoses Final diagnoses:  Generalized weakness  Other ascites    Rx / DC Orders ED Discharge Orders     None         Luna Fuse, MD 09/03/21 1445

## 2021-09-03 NOTE — Assessment & Plan Note (Addendum)
-  Multifactorial generalized weakness, without specific deficit. -Patient with advanced liver disease, worsening ascites and recent COVID infection most likely contributing to generalized weakness and deconditioning. -Continue supportive care, follow physical therapy and Occupational Therapy evaluation/recommendation. -Patient has been encourage to increase oral intake, continue adequate nutrition/hydration. -Follow clinical response.

## 2021-09-04 ENCOUNTER — Observation Stay (HOSPITAL_COMMUNITY): Payer: Medicare Other

## 2021-09-04 ENCOUNTER — Encounter (HOSPITAL_COMMUNITY): Payer: Self-pay | Admitting: Internal Medicine

## 2021-09-04 DIAGNOSIS — R531 Weakness: Secondary | ICD-10-CM | POA: Diagnosis present

## 2021-09-04 DIAGNOSIS — E1169 Type 2 diabetes mellitus with other specified complication: Secondary | ICD-10-CM | POA: Diagnosis present

## 2021-09-04 DIAGNOSIS — K7460 Unspecified cirrhosis of liver: Secondary | ICD-10-CM | POA: Diagnosis present

## 2021-09-04 DIAGNOSIS — A0472 Enterocolitis due to Clostridium difficile, not specified as recurrent: Secondary | ICD-10-CM | POA: Diagnosis present

## 2021-09-04 DIAGNOSIS — E039 Hypothyroidism, unspecified: Secondary | ICD-10-CM | POA: Diagnosis present

## 2021-09-04 DIAGNOSIS — Z8616 Personal history of COVID-19: Secondary | ICD-10-CM | POA: Diagnosis not present

## 2021-09-04 DIAGNOSIS — Z66 Do not resuscitate: Secondary | ICD-10-CM | POA: Diagnosis not present

## 2021-09-04 DIAGNOSIS — N170 Acute kidney failure with tubular necrosis: Secondary | ICD-10-CM | POA: Diagnosis not present

## 2021-09-04 DIAGNOSIS — K766 Portal hypertension: Secondary | ICD-10-CM | POA: Diagnosis present

## 2021-09-04 DIAGNOSIS — N179 Acute kidney failure, unspecified: Secondary | ICD-10-CM

## 2021-09-04 DIAGNOSIS — Z7189 Other specified counseling: Secondary | ICD-10-CM | POA: Diagnosis not present

## 2021-09-04 DIAGNOSIS — D638 Anemia in other chronic diseases classified elsewhere: Secondary | ICD-10-CM | POA: Diagnosis not present

## 2021-09-04 DIAGNOSIS — I350 Nonrheumatic aortic (valve) stenosis: Secondary | ICD-10-CM | POA: Diagnosis present

## 2021-09-04 DIAGNOSIS — E872 Acidosis, unspecified: Secondary | ICD-10-CM | POA: Diagnosis present

## 2021-09-04 DIAGNOSIS — D631 Anemia in chronic kidney disease: Secondary | ICD-10-CM | POA: Diagnosis present

## 2021-09-04 DIAGNOSIS — Z79890 Hormone replacement therapy (postmenopausal): Secondary | ICD-10-CM | POA: Diagnosis not present

## 2021-09-04 DIAGNOSIS — C189 Malignant neoplasm of colon, unspecified: Secondary | ICD-10-CM | POA: Diagnosis present

## 2021-09-04 DIAGNOSIS — Z7984 Long term (current) use of oral hypoglycemic drugs: Secondary | ICD-10-CM | POA: Diagnosis not present

## 2021-09-04 DIAGNOSIS — Z515 Encounter for palliative care: Secondary | ICD-10-CM | POA: Diagnosis not present

## 2021-09-04 DIAGNOSIS — K7581 Nonalcoholic steatohepatitis (NASH): Secondary | ICD-10-CM | POA: Diagnosis present

## 2021-09-04 DIAGNOSIS — R188 Other ascites: Secondary | ICD-10-CM | POA: Diagnosis present

## 2021-09-04 DIAGNOSIS — K7290 Hepatic failure, unspecified without coma: Secondary | ICD-10-CM | POA: Diagnosis not present

## 2021-09-04 DIAGNOSIS — I129 Hypertensive chronic kidney disease with stage 1 through stage 4 chronic kidney disease, or unspecified chronic kidney disease: Secondary | ICD-10-CM | POA: Diagnosis present

## 2021-09-04 DIAGNOSIS — D6959 Other secondary thrombocytopenia: Secondary | ICD-10-CM | POA: Diagnosis present

## 2021-09-04 DIAGNOSIS — C220 Liver cell carcinoma: Secondary | ICD-10-CM | POA: Diagnosis present

## 2021-09-04 DIAGNOSIS — E43 Unspecified severe protein-calorie malnutrition: Secondary | ICD-10-CM | POA: Diagnosis present

## 2021-09-04 DIAGNOSIS — F419 Anxiety disorder, unspecified: Secondary | ICD-10-CM | POA: Diagnosis present

## 2021-09-04 DIAGNOSIS — N184 Chronic kidney disease, stage 4 (severe): Secondary | ICD-10-CM | POA: Diagnosis present

## 2021-09-04 DIAGNOSIS — K767 Hepatorenal syndrome: Secondary | ICD-10-CM | POA: Diagnosis present

## 2021-09-04 DIAGNOSIS — K219 Gastro-esophageal reflux disease without esophagitis: Secondary | ICD-10-CM | POA: Diagnosis present

## 2021-09-04 LAB — BASIC METABOLIC PANEL
Anion gap: 8 (ref 5–15)
BUN: 47 mg/dL — ABNORMAL HIGH (ref 8–23)
CO2: 17 mmol/L — ABNORMAL LOW (ref 22–32)
Calcium: 8.4 mg/dL — ABNORMAL LOW (ref 8.9–10.3)
Chloride: 114 mmol/L — ABNORMAL HIGH (ref 98–111)
Creatinine, Ser: 3.1 mg/dL — ABNORMAL HIGH (ref 0.44–1.00)
GFR, Estimated: 15 mL/min — ABNORMAL LOW (ref >60)
Glucose, Bld: 84 mg/dL (ref 70–99)
Potassium: 4.3 mmol/L (ref 3.5–5.1)
Sodium: 139 mmol/L (ref 135–145)

## 2021-09-04 LAB — C DIFFICILE QUICK SCREEN W PCR REFLEX
C Diff antigen: POSITIVE — AB
C Diff toxin: NEGATIVE

## 2021-09-04 LAB — BODY FLUID CELL COUNT WITH DIFFERENTIAL
Lymphs, Fluid: 57 %
Monocyte-Macrophage-Serous Fluid: 18 % — ABNORMAL LOW (ref 50–90)
Neutrophil Count, Fluid: 25 % (ref 0–25)
Total Nucleated Cell Count, Fluid: 201 cu mm (ref 0–1000)

## 2021-09-04 LAB — ALBUMIN, PLEURAL OR PERITONEAL FLUID: Albumin, Fluid: <1.5 g/dL

## 2021-09-04 LAB — GRAM STAIN

## 2021-09-04 LAB — CBC
HCT: 26.9 % — ABNORMAL LOW (ref 36.0–46.0)
Hemoglobin: 8.3 g/dL — ABNORMAL LOW (ref 12.0–15.0)
MCH: 28.3 pg (ref 26.0–34.0)
MCHC: 30.9 g/dL (ref 30.0–36.0)
MCV: 91.8 fL (ref 80.0–100.0)
Platelets: 82 10*3/uL — ABNORMAL LOW (ref 150–400)
RBC: 2.93 MIL/uL — ABNORMAL LOW (ref 3.87–5.11)
RDW: 16.1 % — ABNORMAL HIGH (ref 11.5–15.5)
WBC: 4 10*3/uL (ref 4.0–10.5)
nRBC: 0 % (ref 0.0–0.2)

## 2021-09-04 LAB — GLUCOSE, PLEURAL OR PERITONEAL FLUID: Glucose, Fluid: 105 mg/dL

## 2021-09-04 MED ORDER — GLUCERNA SHAKE PO LIQD: 237.0000 mL | Freq: Three times a day (TID) | ORAL | Status: DC

## 2021-09-04 MED ORDER — ADULT MULTIVITAMIN W/MINERALS CH
1.0000 | ORAL_TABLET | Freq: Every day | ORAL | Status: DC
  Administered 2021-09-04 – 2021-09-09 (×6): 1 via ORAL
  Filled 2021-09-04 (×6): qty 1

## 2021-09-04 MED ORDER — PROSOURCE PLUS PO LIQD
30.0000 mL | Freq: Three times a day (TID) | ORAL | Status: DC
  Administered 2021-09-04 – 2021-09-09 (×10): 30 mL via ORAL
  Filled 2021-09-04 (×15): qty 30

## 2021-09-04 MED ORDER — SODIUM CHLORIDE 0.9 % IV SOLN
INTRAVENOUS | Status: DC | PRN
Start: 2021-09-04 — End: 2021-09-09

## 2021-09-04 MED ORDER — CALCIUM CARBONATE ANTACID 500 MG PO CHEW
1.0000 | CHEWABLE_TABLET | Freq: Two times a day (BID) | ORAL | Status: DC
  Administered 2021-09-05 – 2021-09-09 (×9): 200 mg via ORAL
  Filled 2021-09-04 (×10): qty 1

## 2021-09-04 MED ORDER — ENSURE ENLIVE PO LIQD
237.0000 mL | Freq: Three times a day (TID) | ORAL | Status: DC
  Administered 2021-09-04 – 2021-09-06 (×4): 237 mL via ORAL

## 2021-09-04 MED ORDER — ALBUMIN HUMAN 25 % IV SOLN
50.0000 g | Freq: Four times a day (QID) | INTRAVENOUS | Status: AC
  Administered 2021-09-04 – 2021-09-05 (×3): 50 g via INTRAVENOUS
  Filled 2021-09-04 (×3): qty 200

## 2021-09-04 MED ORDER — LORAZEPAM 0.5 MG PO TABS
0.2500 mg | ORAL_TABLET | Freq: Three times a day (TID) | ORAL | Status: DC | PRN
  Administered 2021-09-04 – 2021-09-09 (×7): 0.25 mg via ORAL
  Filled 2021-09-04 (×7): qty 1

## 2021-09-04 NOTE — Progress Notes (Signed)
PT tolerated right sided paracentesis procedure well today and 6 Liters of clear yellow fluid removed with labs collected and sent for processing. PT verbalized understanding of post procedure instructions and taken via stretcher back to inpatient bed assignment at this time.

## 2021-09-04 NOTE — Progress Notes (Signed)
Progress Note   [name] [id number] DOB: [birth year] DOA: 09/03/2021     0 DOS: the patient was seen and examined on 09/04/2021   Brief hospital admission narrative: Cheryl Jordan is a 81 y.o. female with medical history significant of liver cirrhosis due to NASH, colon cancer, CKD stage 3b, T2DM, severe protein calorie malnutrition, who had a recent hospitalization 08/18/21 to 08/24/21 for SARS COVID 19 infection and decompensated liver cirrhosis. She was discharged home with home health services.    At home patient continue with progressive weakness, to the point she was not able to further ambulate independently. Very poor oral intake and worsening abdominal distention. Over last 2 to 3 days has been experiencing diarrhea that has been refractive to imodium.    Because of worsening weakness she was brought to the hospital for further evaluation. Denies any abdominal pain, nausea or vomiting, no dyspnea or chest pain.   Assessment and Plan: * Weakness -Multifactorial generalized weakness, without specific deficit. -Patient with advanced liver disease, worsening ascites and recent COVID infection most likely contributing to generalized weakness and deconditioning. -Continue supportive care, follow physical therapy and Occupational Therapy evaluation/recommendation. -Patient has been encourage to increase oral intake, continue adequate nutrition/hydration. -Follow clinical response.    Anemia of chronic disease- (present on admission) -Chronic thrombocytopenia; due to chronic liver disease.  Continue avoiding heparin products. -Plan is to transfuse for hemoglobin less than 7 -Continue to follow hemoglobin trend. -No signs of acute bleeding. 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  Protein-calorie malnutrition, severe- (present on admission) -Follow with nutrition for recommendations for feeding supplements.  -In the setting of chronic illness and  decreased oral intake.  Diarrhea- (present on admission) -No further diarrhea event reported -Stool results from admission with positive C. difficile antigen but negative toxin -Appears to be colonization and no acute infection -Continue supportive care.     Decompensated hepatic cirrhosis ([name])- (present on admission) -Child Pugh B with MELD 19. Hepatocellular carcinoma.  -Patient with recurrent ascites.  -IR evaluation last hospitalization she was found candidate for TIPS, but she did request more time before consenting for the procedure. -Planning for ultrasound paracentesis during this hospitalization -IV albumin to be provided. -Patient expressed mild discomfort in her abdomen due to distention; no fever or frank pain on palpation. -Normal WBCs appreciated on examination.   Acute renal failure superimposed on stage 3b chronic kidney disease ([name])- (present on admission) -Renal function with serum cr at 3,37 with K at 5,1 and serum bicarbonate at 18 at time of admission. -Creatinine has improved to 3.1 and her potassium 4.3 currently.  Continue to demonstrate metabolic acidosis in the setting of chronic renal failure.  Bicarb 17. -Continue treatment with IV albumin infusion, ascites and when her renal function further improve will provide low-dose diuretics. -Minimize/avoid as much as possible the use of nephrotoxic agents and contrast. -Avoid also hypotension.  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[name])- (present on admission) - Continue to follow CBGs and further adjust hypoglycemic regimen as needed. -While inpatient continue sliding scale insulin.  Adenocarcinoma of colon [name])- (present on admission) -Continue outpatient follow-up with oncology service.    GERD (gastroesophageal reflux disease)- (present on admission) -Continue with antiacid therapy  -Patient reports no reflux symptoms currently.  Hypothyroidism- (present on admission) -Continue with levothyroxine       Subjective:  Afebrile, no chest pain, no nausea, no vomiting.  Reports feeling weak, tired and deconditioned.  Ongoing abdominal discomfort and  distention.  Physical Exam: Vitals:   09/04/21 1130 09/04/21 1150 09/04/21 1225 09/04/21 1243  BP: (!) 146/84 126/62 124/66 (!) 108/52  Pulse: 86 88 84 85  Resp: 18 18 18 17   Temp: 97.7 F (36.5 C)   98.6 F (37 C)  TempSrc: Oral     SpO2: 97% 97% 96% 98%  Weight:      Height:       General exam: Alert, awake, oriented x 3; no fever, no chest pain, no nausea, no vomiting.  No further diarrhea has been reported. Respiratory system: Clear to auscultation. Respiratory effort normal.  No using accessory muscles; good saturation on room air. Cardiovascular system:RRR. No murmurs, rubs, gallops.  No JVD. Gastrointestinal system: Abdomen is distended with the presence of fluid wave from ascites; no tenderness on palpation.  Positive bowel sounds.   Central nervous system: Alert and oriented. No focal neurological deficits. Extremities: No cyanosis or clubbing. Skin: No petechiae. Psychiatry: Mood & affect appropriate.    Data Reviewed: Positive C. difficile antigen with negative toxin (suggesting colonization without acute infection). Hemoglobin 8.3; normal WBCs and platelets count 82,000. Potassium within normal limits, bicarb 17 and creatinine 3.10  Family Communication: Son updated over the phone.  Disposition: Status is: Inpatient Remains inpatient appropriate because: Still with acute on chronic renal failure and actively receiving management for hepatorenal syndrome.     Planned Discharge Destination: Home with Home Health    Author: Barton Dubois, MD 09/04/2021 5:38 PM  For on call review [email or web address].

## 2021-09-04 NOTE — Progress Notes (Signed)
Alert and oriented and assisted up to bedside commode.  Had mushy bm about 1.5 cm round, collected and sent to lab.  Only bm since admission yesterday afternoon.  Asked for home med ativan for nerves and was given 0.25 mg.  Walked with walker and standby to door and back.

## 2021-09-04 NOTE — Procedures (Signed)
PreOperative Dx: Cirrhosis, ascites Postoperative Dx: Cirrhosis, ascites Procedure:   US guided paracentesis Radiologist:  Tranisha Tissue Anesthesia:  10 ml of1% lidocaine Specimen:  6 L of yellow ascitic fluid EBL:   < 1 ml Complications: None  

## 2021-09-04 NOTE — Progress Notes (Signed)
Returned from paracentesis c/o nausea and given zofran.  Drssing to right lateral abd dry and intact.  Does not want lunch and has been refusing ensure. Started albumin infusion

## 2021-09-04 NOTE — Progress Notes (Signed)
Initial Nutrition Assessment  DOCUMENTATION CODES:   Not applicable  INTERVENTION:   -D/c Glucerna shake -Ensure Enlive po TID, each supplement provides 350 kcal and 20 grams of protein.  -30 ml Prosource Plus TID, each supplement provides 100 kcals and 15 grams protein -MVI with minerals daily  NUTRITION DIAGNOSIS:   Increased nutrient needs related to chronic illness (cirrhosis) as evidenced by estimated needs.  GOAL:   Patient will meet greater than or equal to 90% of their needs  MONITOR:   PO intake, Supplement acceptance, Labs, Weight trends, Skin, I & O's  REASON FOR ASSESSMENT:   Malnutrition Screening Tool    ASSESSMENT:   Cheryl Jordan is a 81 y.o. female with medical history significant of liver cirrhosis due to NASH, colon cancer, CKD stage 3b, T2DM, severe protein calorie malnutrition, who had a recent hospitalization 08/18/21 to 08/24/21 for SARS COVID 19 infection and decompensated liver cirrhosis. She was discharged home with home health services.  Pt admitted with weakness and decompensated hepatic cirrhosis.   2/20- s/p paracentesis- 6 L removed  Case discussed with RN; pt down for paracentesis at time of visit. No family present.   Pt with poor appetite. Noted meal completion 25%. Pt refused Glucerna supplement this morning.   Reviewed wt hx; pt has experienced a 12.6% wt loss over the past 6 months, which is significant for time frame.   Pt with poor oral intake and would benefit from nutrient dense supplement. One Ensure Enlive supplement provides 350 kcals, 20 grams protein, and 44-45 grams of carbohydrate vs one Glucerna shake supplement, which provides 220 kcals, 10 grams of protein, and 26 grams of carbohydrate. Given pt's hx of DM, RD will reassess adequacy of PO intake, CBGS, and adjust supplement regimen as appropriate at follow-up.    Pt is at high risk for malnutrition, however, unable to identify at this time. Pt would greatly benefit from  addition of oral nutrition supplements.   Medications reviewed and include melatonin.   Lab Results  Component Value Date   HGBA1C 4.8 08/07/2021   PTA DM medications are 5 mg glipizide daily.   Labs reviewed. Inpatient orders for glycemic control are none.  Diet Order:   Diet Order             Diet regular Room service appropriate? Yes; Fluid consistency: Thin  Diet effective now                   EDUCATION NEEDS:   No education needs have been identified at this time  Skin:  Skin Assessment: Reviewed RN Assessment  Last BM:  09/04/21 (type 6)  Height:   Ht Readings from Last 1 Encounters:  09/03/21 5\' 7"  (1.702 m)    Weight:   Wt Readings from Last 1 Encounters:  09/03/21 80.8 kg    Ideal Body Weight:  61.4 kg  BMI:  Body mass index is 27.91 kg/m.  Estimated Nutritional Needs:   Kcal:  1950-2150  Protein:  110-125 grams  Fluid:  > 1.9 L    Loistine Chance, RD, LDN, [place] Registered Dietitian II Certified Diabetes Care and Education Specialist Please refer to [facility] for RD and/or RD on-call/weekend/after hours pager

## 2021-09-05 DIAGNOSIS — D638 Anemia in other chronic diseases classified elsewhere: Secondary | ICD-10-CM

## 2021-09-05 LAB — URINALYSIS, ROUTINE W REFLEX MICROSCOPIC
Bilirubin Urine: NEGATIVE
Glucose, UA: NEGATIVE mg/dL
Hgb urine dipstick: NEGATIVE
Ketones, ur: NEGATIVE mg/dL
Nitrite: NEGATIVE
Protein, ur: NEGATIVE mg/dL
Specific Gravity, Urine: 1.014 (ref 1.005–1.030)
pH: 5 (ref 5.0–8.0)

## 2021-09-05 LAB — COMPREHENSIVE METABOLIC PANEL
ALT: 9 U/L (ref 0–44)
AST: 21 U/L (ref 15–41)
Albumin: 3.6 g/dL (ref 3.5–5.0)
Alkaline Phosphatase: 63 U/L (ref 38–126)
Anion gap: 6 (ref 5–15)
BUN: 43 mg/dL — ABNORMAL HIGH (ref 8–23)
CO2: 19 mmol/L — ABNORMAL LOW (ref 22–32)
Calcium: 8.6 mg/dL — ABNORMAL LOW (ref 8.9–10.3)
Chloride: 112 mmol/L — ABNORMAL HIGH (ref 98–111)
Creatinine, Ser: 3.2 mg/dL — ABNORMAL HIGH (ref 0.44–1.00)
GFR, Estimated: 14 mL/min — ABNORMAL LOW (ref >60)
Glucose, Bld: 174 mg/dL — ABNORMAL HIGH (ref 70–99)
Potassium: 3.9 mmol/L (ref 3.5–5.1)
Sodium: 137 mmol/L (ref 135–145)
Total Bilirubin: 1 mg/dL (ref 0.3–1.2)
Total Protein: 5.1 g/dL — ABNORMAL LOW (ref 6.5–8.1)

## 2021-09-05 LAB — CLOSTRIDIUM DIFFICILE BY PCR, REFLEXED: Toxigenic C. Difficile by PCR: POSITIVE — AB

## 2021-09-05 LAB — PROTIME-INR
INR: 1.4 — ABNORMAL HIGH (ref 0.8–1.2)
Prothrombin Time: 17.5 seconds — ABNORMAL HIGH (ref 11.4–15.2)

## 2021-09-05 LAB — PATHOLOGIST SMEAR REVIEW

## 2021-09-05 LAB — PROTEIN, BODY FLUID (OTHER): Total Protein, Body Fluid Other: 1.5 g/dL

## 2021-09-05 MED ORDER — PROCHLORPERAZINE EDISYLATE 10 MG/2ML IJ SOLN
10.0000 mg | Freq: Four times a day (QID) | INTRAMUSCULAR | Status: DC | PRN
  Administered 2021-09-06: 10 mg via INTRAVENOUS
  Filled 2021-09-05 (×2): qty 2

## 2021-09-05 MED ORDER — ONDANSETRON HCL 4 MG/2ML IJ SOLN
4.0000 mg | Freq: Four times a day (QID) | INTRAMUSCULAR | Status: DC
  Administered 2021-09-05 – 2021-09-09 (×11): 4 mg via INTRAVENOUS
  Filled 2021-09-05 (×13): qty 2

## 2021-09-05 MED ORDER — ALBUMIN HUMAN 25 % IV SOLN
50.0000 g | Freq: Three times a day (TID) | INTRAVENOUS | Status: AC
Start: 2021-09-05 — End: 2021-09-06
  Administered 2021-09-05 – 2021-09-06 (×3): 50 g via INTRAVENOUS
  Filled 2021-09-05 (×3): qty 200

## 2021-09-05 MED ORDER — SACCHAROMYCES BOULARDII 250 MG PO CAPS
250.0000 mg | ORAL_CAPSULE | Freq: Two times a day (BID) | ORAL | Status: DC
  Administered 2021-09-05 – 2021-09-09 (×8): 250 mg via ORAL
  Filled 2021-09-05 (×8): qty 1

## 2021-09-05 MED ORDER — CALCIUM CARBONATE ANTACID 500 MG PO CHEW
1.0000 | CHEWABLE_TABLET | Freq: Four times a day (QID) | ORAL | Status: DC | PRN
  Filled 2021-09-05: qty 1

## 2021-09-05 NOTE — Progress Notes (Signed)
Progress Note   [name] FFM:[id number] DOB: [birth year] DOA: 09/03/2021     1 DOS: the patient was seen and examined on 09/05/2021   Brief hospital admission narrative: Cheryl Jordan is a 81 y.o. female with medical history significant of liver cirrhosis due to NASH, colon cancer, CKD stage 3b, T2DM, severe protein calorie malnutrition, who had a recent hospitalization 08/18/21 to 08/24/21 for SARS COVID 19 infection and decompensated liver cirrhosis. She was discharged home with home health services.    At home patient continue with progressive weakness, to the point she was not able to further ambulate independently. Very poor oral intake and worsening abdominal distention. Over last 2 to 3 days has been experiencing diarrhea that has been refractive to imodium.    Because of worsening weakness she was brought to the hospital for further evaluation. Denies any abdominal pain, nausea or vomiting, no dyspnea or chest pain.   Assessment and Plan: * Weakness -Multifactorial generalized weakness, without specific deficit. -Patient with advanced liver disease, worsening ascites and recent COVID infection most likely contributing to generalized weakness and deconditioning. -Continue supportive care, follow physical therapy and Occupational Therapy evaluation/recommendation. -Patient has been encourage to increase oral intake, continue adequate nutrition/hydration. -Follow clinical response.    Anemia of chronic disease- (present on admission) -Chronic thrombocytopenia; due to chronic liver disease.  Continue avoiding heparin products. -Plan is to transfuse for hemoglobin less than 7 -Continue to follow hemoglobin trend. -No signs of acute bleeding. 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  Protein-calorie malnutrition, severe- (present on admission) -Follow with nutrition for recommendations for feeding supplements.  -In the setting of chronic illness and  decreased oral intake.  Diarrhea- (present on admission) -Couple overnight loose stools reported -Complaining of nausea. -Stool results from admission with positive C. difficile antigen but negative toxin -Discussed with GI service and will wait on C. difficile PCR results to determine need for treatment. -Florastor twice daily has been added to assist with loose stools. -Appears to be colonization and no acute infection -Continue supportive care.     Decompensated hepatic cirrhosis ([place])- (present on admission) -Child Pugh B with MELD 19. Hepatocellular carcinoma.  -Patient with recurrent ascites.  -IR evaluation last hospitalization she was found candidate for TIPS, but she did request more time before consenting for the procedure. -Status post 6 L ascitic fluid removal with paracentesis. -GI service has been consulted -Will follow further recommendations. -Continue treatment with IV albumin for now.  -Overall prognosis is poor.  -Normal WBCs appreciated on examination.   Acute renal failure superimposed on stage 3b chronic kidney disease ([place])- (present on admission) -Renal function with serum cr at 3,37 with K at 5,1 and serum bicarbonate at 18 at time of admission. -Creatinine has improved to 3.20 and her potassium is stable in the 4 range currently.  Continue to demonstrate metabolic acidosis in the setting of chronic renal failure.  Bicarb 19. -Continue treatment with IV albumin infusion -Gastroenterology service has been consulted. -Overall prognosis is poor; palliative care has also been involved. -Minimize/avoid as much as possible the use of nephrotoxic agents and contrast. -Avoid also hypotension.  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[place])- (present on admission) - Continue to follow CBGs and further adjust hypoglycemic regimen as needed. -While inpatient continue sliding scale insulin.  Adenocarcinoma of colon [facility])- (present on admission) -Continue outpatient  follow-up with oncology service.    GERD (gastroesophageal reflux disease)- (present on admission) -Continue with antiacid therapy  -  Patient reports no reflux symptoms currently.  Hypothyroidism- (present on admission) -Continue with levothyroxine      Subjective:  No fever, no chest pain, no shortness of breath.  Patient reports upset stomach symptoms and is complaining of nausea.  Positive diarrhea/loose stools.  No overt bleeding.  Physical Exam: Vitals:   09/04/21 1243 09/04/21 2054 09/05/21 0320 09/05/21 1243  BP: (!) 108/52 (!) 103/49 (!) 92/37 (!) 120/48  Pulse: 85 79 74 87  Resp: 17 15 14 18   Temp: 98.6 F (37 C) 98.1 F (36.7 C) 98 F (36.7 C) 99.5 F (37.5 C)  TempSrc:  Oral  Oral  SpO2: 98% 97% 96% 97%  Weight:      Height:       General exam: Alert, awake, oriented x 3; reports no chest pain, no shortness of breath, and is afebrile.  Patient having ongoing intermittent loose stools and feeling the stomach upset.  She is complaining of nausea. Respiratory system: Clear to auscultation. Respiratory effort normal.  No using accessory muscle.  Good saturation on room air. Cardiovascular system:RRR. No murmurs, rubs, gallops. Gastrointestinal system: Abdomen is significant improvement in distention after 6 L removed with paracentesis.  No tenderness on palpation.  Positive bowel sounds. Central nervous system: Alert and oriented. No focal neurological deficits. Extremities: No Cyanosis or clubbing. Skin: No petechiae. Psychiatry: Mood & affect appropriate.    Data Reviewed: Positive C. difficile antigen with negative toxin (suggesting colonization without acute infection); C. Diff PCR pending. Last Hemoglobin 8.3; normal WBCs and platelets count 82,000. UA not suggesting acute urinary tract infection. Bicarb 19, creatinine 3.20; normal potassium and a stable LFTs.  Family Communication: Unable to reach family members over the phone; no family at  bedside.  Disposition: Status is: Inpatient Remains inpatient appropriate because: Still with acute on chronic renal failure and actively receiving management for hepatorenal syndrome.     Planned Discharge Destination: Home with Home Health    Author: Barton Dubois, MD 09/05/2021 5:29 PM  For on call review [email or web address].

## 2021-09-05 NOTE — TOC Progression Note (Signed)
°  Transition of Care [facility]) Screening Note   Patient Details  Name: Cheryl Jordan Date of Birth: May 20, 1941   Transition of Care [facility]) CM/SW Contact:    Shade Flood, LCSW Phone Number: 09/05/2021, 3:16 PM    Transition of Care [facility]) has reviewed patient and no TOC needs have been identified at this time. Palliative, GI, and PT have been consulted. We will continue to monitor patient advancement through interdisciplinary progression rounds. If new patient transition needs arise, please place a TOC consult.

## 2021-09-05 NOTE — Consult Note (Addendum)
Referring Provider: Dr. Dyann Kief Primary Care Physician:  Wannetta Sender, FNP Primary Gastroenterologist:  Dr. 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   HPI:  Cheryl Jordan is a 81 y.o. 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 GI consulted due to concern for development of hepatorenal syndrome.    Para completed 2/20 with 6 liters removed. Negative cell count. Negative cultures thus far. She was found to have positive Cdiff antigen but negative toxin. Cdiff PCR is in process. Denies any overt GI bleeding. Feels weak and tired. Nauseated all the time but no vomiting. 2 "mushy" small stools today. No watery stool. No abdominal pain. No mental status changes or confusion. She has a significantly poor appetite. No lower extremity edema. Abdomen still feels distended. Notes pill dysphagia otherwise no solid food dysphagia.   Palliative team saw patient during recent admission in [place]. Plans had been for outpatient palliative assistance.   She was evaluated by IR while inpatient Feb 2023 and felt could potentially be considered for TIPS at that time.    Past Medical History:  Diagnosis Date   Allergy    Anemia    hx of   Anxiety    Aortic stenosis    mild by 04/08/20 echo   Arthritis    Colon cancer ([place]) dx'd 11/2014   Diabetes mellitus without complication (HCC)    TYPE II   Dyspnea    Fatty liver    Gallbladder disease    GERD (gastroesophageal reflux disease)    Heart murmur    History of gout     Hypertension    Hypothyroidism    Kidney disorder    spot on left kidney   Osteoporosis    PONV (postoperative nausea and vomiting)     Past Surgical History:  Procedure Laterality Date   BIOPSY  06/17/2020   Procedure: BIOPSY;  Surgeon: Irving Copas., MD;  Location: [facility];  Service: Gastroenterology;;   CATARACT EXTRACTION Left    CHOLECYSTECTOMY     COLONOSCOPY WITH PROPOFOL N/A 06/17/2020   Procedure: COLONOSCOPY WITH PROPOFOL;  Surgeon: Irving Copas., MD;  Location: [facility];  Service: Gastroenterology;  Laterality: N/A;   COLONOSCOPY WITH PROPOFOL N/A 07/01/2020   Procedure: COLONOSCOPY WITH PROPOFOL;  Surgeon: Mauri Pole, MD;  Location: [facility];  Service: Endoscopy;  Laterality: N/A;   ESOPHAGOGASTRODUODENOSCOPY (EGD) WITH PROPOFOL N/A 06/17/2020   Procedure: ESOPHAGOGASTRODUODENOSCOPY (EGD) WITH PROPOFOL;  Surgeon: Rush Landmark Telford Nab., MD;  Location: [facility];  Service: Gastroenterology;  Laterality: N/A;   ESOPHAGOGASTRODUODENOSCOPY (EGD) WITH PROPOFOL N/A 07/01/2020   Procedure: ESOPHAGOGASTRODUODENOSCOPY (EGD) WITH PROPOFOL;  Surgeon: Mauri Pole, MD;  [facility]: [facility];  Service: Endoscopy;  Laterality: N/A;   ESOPHAGOGASTRODUODENOSCOPY (EGD) WITH PROPOFOL N/A 08/10/2021   Procedure: ESOPHAGOGASTRODUODENOSCOPY (EGD) WITH PROPOFOL;  Surgeon: Daryel November, MD;  Location: [facility];  Service: Gastroenterology;  Laterality: N/A;   FOREIGN BODY REMOVAL  06/17/2020   Procedure: FOREIGN BODY REMOVAL;  Surgeon: Rush Landmark Telford Nab., MD;  Location: [facility];  Service: Gastroenterology;;   GI RADIOFREQUENCY ABLATION  06/17/2020  Procedure: GI RADIOFREQUENCY ABLATION;  Surgeon: Rush Landmark Telford Nab., MD;  Location: [facility];  Service: Gastroenterology;;   HEMOSTASIS CLIP PLACEMENT  06/17/2020   Procedure: HEMOSTASIS CLIP PLACEMENT;  Surgeon: Irving Copas., MD;  Location: [facility];   Service: Gastroenterology;;   HEMOSTASIS CLIP PLACEMENT  07/01/2020   Procedure: HEMOSTASIS CLIP PLACEMENT;  Surgeon: Mauri Pole, MD;  Location: [facility];  Service: Endoscopy;;   HOT HEMOSTASIS N/A 07/01/2020   Procedure: HOT HEMOSTASIS (ARGON PLASMA COAGULATION/BICAP);  Surgeon: Mauri Pole, MD;  Location: [facility];  Service: Endoscopy;  Laterality: N/A;   HOT HEMOSTASIS  08/10/2021   Procedure: HOT HEMOSTASIS (ARGON PLASMA COAGULATION/BICAP);  Surgeon: Daryel November, MD;  Location: [facility];  Service: Gastroenterology;;   IR PARACENTESIS  08/19/2020   IR PARACENTESIS  09/26/2020   IR PARACENTESIS  11/03/2020   IR PARACENTESIS  11/28/2020   IR PARACENTESIS  12/21/2020   IR PARACENTESIS  01/17/2021   IR PARACENTESIS  02/08/2021   IR PARACENTESIS  02/23/2021   IR PARACENTESIS  03/22/2021   IR PARACENTESIS  04/06/2021   IR PARACENTESIS  04/20/2021   IR PARACENTESIS  05/03/2021   IR PARACENTESIS  05/16/2021   IR PARACENTESIS  05/29/2021   IR PARACENTESIS  06/07/2021   IR PARACENTESIS  06/15/2021   IR PARACENTESIS  06/28/2021   IR PARACENTESIS  07/05/2021   IR PARACENTESIS  07/14/2021   IR PARACENTESIS  07/21/2021   IR PARACENTESIS  08/01/2021   IR RADIOLOGIST EVAL & MGMT  01/24/2017   IR RADIOLOGIST EVAL & MGMT  02/22/2021   IR RADIOLOGIST EVAL & MGMT  03/28/2021   IR RADIOLOGIST EVAL & MGMT  06/06/2021   IR RADIOLOGIST EVAL & MGMT  08/29/2021   KNEE CARTILAGE SURGERY Bilateral    NEPHRECTOMY Left 02/10/2015   Procedure: OPEN RETROPERINTONEAL EXPLORATION LEFT RENAL  CYST DECORTICATION X 5;  Surgeon: Cleon Gustin, MD;  Location: [facility];  Service: Urology;  Laterality: Left;   PARTIAL COLECTOMY N/A 02/10/2015   Procedure: OPEN RIGHT  COLECTOMY ;  Surgeon: [name], MD;  Location: [facility];  Service: General;  Laterality: N/A;   POLYPECTOMY  06/17/2020   Procedure: POLYPECTOMY;  Surgeon: Irving Copas., MD;  Location: [facility];  Service:  Gastroenterology;;   RADIOLOGY WITH ANESTHESIA N/A 03/08/2021   Procedure: CT MICROWAVE ABLATION WITH ANESTHESIA;  Surgeon: Arne Cleveland, MD;  Location: [facility];  Service: Radiology;  Laterality: N/A;   TUBAL LIGATION      Prior to Admission medications   Medication Sig Start Date End Date Taking? Authorizing Provider  acetaminophen (TYLENOL) 500 MG tablet Take 500 mg by mouth daily as needed for moderate pain.   Yes [provider]  amLODipine (NORVASC) 2.5 MG tablet Take 1 tablet by mouth daily.   Yes [provider]  benzonatate (TESSALON PERLES) 100 MG capsule Take 1 capsule (100 mg total) by mouth 3 (three) times daily as needed for cough. 08/11/21 08/11/22 Yes Lavina Hamman, MD  glipiZIDE (GLUCOTROL XL) 5 MG 24 hr tablet Take 5 mg by mouth daily with breakfast.   Yes [provider]  levothyroxine (SYNTHROID) 88 MCG tablet Take 1 tablet (88 mcg total) by mouth daily. 12/04/19  Yes Brunetta Jeans, PA-C  loperamide (IMODIUM) 2 MG capsule Take 2 mg by mouth as needed for diarrhea or loose stools.   Yes [provider]  LORazepam (ATIVAN) 0.5 MG tablet Take 0.5 mg by mouth 2 (two) times daily  as needed for anxiety.   Yes [provider]  ondansetron (ZOFRAN) 4 MG tablet Take 1 tablet (4 mg total) by mouth every 8 (eight) hours as needed for nausea or vomiting. 08/24/21 08/24/22 Yes Rai, Ripudeep K, MD  pantoprazole (PROTONIX) 40 MG tablet Take 1 tablet (40 mg total) by mouth 2 (two) times daily before a meal. 08/24/21  Yes Rai, Ripudeep K, MD  propranolol (INDERAL) 40 MG tablet Take 20 mg by mouth 2 (two) times daily.   Yes [provider]  Simethicone (GAS-X PO) Take 1 tablet by mouth as needed (flatulence).   Yes [provider]  sucralfate (CARAFATE) 1 g tablet Take 1 tablet (1 g total) by mouth 2 (two) times daily. 08/24/21  Yes Rai, Vernelle Emerald, MD  ACCU-CHEK AVIVA PLUS test strip  10/20/18   [provider]   guaiFENesin-dextromethorphan (ROBITUSSIN DM) 100-10 MG/5ML syrup Take 5 mLs by mouth every 4 (four) hours as needed for cough. Patient not taking: Reported on 08/19/2021 08/11/21   Lavina Hamman, MD  lactose free nutrition (BOOST PLUS) LIQD Take 237 mLs by mouth 3 (three) times daily with meals. Patient not taking: Reported on 08/19/2021 08/11/21   Lavina Hamman, MD  sodium bicarbonate 325 MG tablet Take 1 tablet (325 mg total) by mouth 2 (two) times daily. Patient not taking: Reported on 09/04/2021 08/24/21   Mendel Corning, MD    Current Facility-Administered Medications  Medication Dose Route Frequency Provider Last Rate Last Admin   (feeding supplement) PROSource Plus liquid 30 mL  30 mL Oral TID BM Barton Dubois, MD   30 mL at 09/05/21 1448   0.9 %  sodium chloride infusion   Intravenous PRN Barton Dubois, MD   Stopped at 09/04/21 2042   acetaminophen (TYLENOL) tablet 650 mg  650 mg Oral Q6H PRN Tawni Millers, MD   650 mg at 09/04/21 2040   Or   acetaminophen (TYLENOL) suppository 650 mg  650 mg Rectal Q6H PRN Arrien, Jimmy Picket, MD       benzonatate (TESSALON) capsule 100 mg  100 mg Oral TID PRN Tawni Millers, MD   100 mg at 09/04/21 0758   calcium carbonate (TUMS - dosed in mg elemental calcium) chewable tablet 200 mg of elemental calcium  1 tablet Oral BID WC Zierle-Ghosh, Asia B, DO   200 mg of elemental calcium at 09/05/21 0856   enoxaparin (LOVENOX) injection 30 mg  30 mg Subcutaneous Q24H Tawni Millers, MD   30 mg at 09/04/21 2246   feeding supplement (ENSURE ENLIVE / ENSURE PLUS) liquid 237 mL  237 mL Oral TID BM Barton Dubois, MD   237 mL at 09/05/21 1446   levothyroxine (SYNTHROID) tablet 88 mcg  88 mcg Oral Daily Tawni Millers, MD   88 mcg at 09/05/21 0427   LORazepam (ATIVAN) tablet 0.25 mg  0.25 mg Oral Q8H PRN Barton Dubois, MD   0.25 mg at 09/05/21 0915   melatonin tablet 6 mg  6 mg Oral QHS Zierle-Ghosh, Asia B, DO   6 mg at  09/04/21 2244   multivitamin with minerals tablet 1 tablet  1 tablet Oral Daily Barton Dubois, MD   1 tablet at 09/05/21 0856   ondansetron (ZOFRAN) tablet 4 mg  4 mg Oral Q6H PRN Arrien, Jimmy Picket, MD       Or   ondansetron [facility]) injection 4 mg  4 mg Intravenous Q6H PRN Arrien, Jimmy Picket, MD  4 mg at 09/05/21 1250   pantoprazole (PROTONIX) EC tablet 40 mg  40 mg Oral BID AC Arrien, Jimmy Picket, MD   40 mg at 09/05/21 4098   saccharomyces boulardii (FLORASTOR) capsule 250 mg  250 mg Oral BID Barton Dubois, MD       sodium bicarbonate tablet 325 mg  325 mg Oral BID Tawni Millers, MD   325 mg at 09/05/21 0856   sucralfate (CARAFATE) tablet 1 g  1 g Oral BID Arrien, Jimmy Picket, MD   1 g at 09/05/21 1191    Allergies as of 09/03/2021 - Review Complete 09/03/2021  Allergen Reaction Noted   Aspirin Other (See Comments) 10/16/2011   Codeine Nausea And Vomiting 10/16/2011   Tape Other (See Comments) 08/18/2021    Family History  Problem Relation Age of Onset   Diabetes Mother    Hypertension Other    COPD Father    Hypertension Sister    GER disease Sister    Heart attack Brother    COPD Brother    Lung cancer Brother    COPD Brother    Hypertension Brother     Social History   Socioeconomic History   Marital status: Married    Spouse name: Not on file   Number of children: 2   Years of education: Not on file   Highest education level: Not on file  Occupational History   Occupation: Retired  Tobacco Use   Smoking status: Never   Smokeless tobacco: Never  Vaping Use   Vaping Use: Never used  Substance and Sexual Activity   Alcohol use: No   Drug use: No   Sexual activity: Never  Other Topics Concern   Not on file  Social History Narrative   03/03/15-Married, husband Jeneen Rinks   #2 grown sons and #2 grand daughters   Social Determinants of Health   Financial Resource Strain: Not on file  Food Insecurity: Not on file  Transportation  Needs: Not on file  Physical Activity: Not on file  Stress: Not on file  Social Connections: Not on file  Intimate Partner Violence: Not on file    Review of Systems: Gen: see HPI CV: Denies chest pain, heart palpitations, syncope, edema  Resp: Denies shortness of breath with rest, cough, wheezing GI: see HPI GU : Denies urinary burning, urinary frequency, urinary incontinence.  MS: Denies joint pain,swelling, cramping Derm: Denies rash, itching, dry skin Psych: Denies depression, anxiety,confusion, or memory loss Heme: see HPI  Physical Exam: Vital signs in last 24 hours: Temp:  [98 F (36.7 C)-99.5 F (37.5 C)] 99.5 F (37.5 C) (02/21 1243) Pulse Rate:  [74-87] 87 (02/21 1243) Resp:  [14-18] 18 (02/21 1243) BP: (92-120)/(37-49) 120/48 (02/21 1243) SpO2:  [96 %-97 %] 97 % (02/21 1243) Last BM Date : 09/05/21 General:   Alert,  frail, pleasant.  Head:  Normocephalic and atraumatic. Eyes:  Sclera clear, no icterus.   Ears:  Normal auditory acuity. Lungs:  Clear throughout to auscultation.    Heart:  S1 S2 present without murmurs Abdomen:  Distended with non-tense ascites, no TTP, small umbilical hernia, no hepatomegaly. Msk:  Symmetrical without gross deformities. Normal posture. Pulses:  Normal pulses noted. Extremities:  Without edema. Neurologic:  Alert and  oriented x4;  negative asterixis Psych:  Alert and cooperative. Normal mood and affect.  Intake/Output from previous day: 02/20 0701 - 02/21 0700 In: 731.4 [P.O.:660; I.V.:71.4] Out: -  Intake/Output this shift: Total I/O In: 300 [P.O.:300]  Out: -   Lab Results: Recent Labs    09/03/21 1217 09/04/21 0524  WBC 4.8 4.0  HGB 10.6* 8.3*  HCT 33.9* 26.9*  PLT 98* 82*   BMET Recent Labs    09/03/21 1217 09/04/21 0524  NA 139 139  K 5.1 4.3  CL 112* 114*  CO2 18* 17*  GLUCOSE 110* 84  BUN 48* 47*  CREATININE 3.37* 3.10*  CALCIUM 8.8* 8.4*   LFT Recent Labs    09/03/21 1217  PROT 5.8*   ALBUMIN 2.6*  AST 29  ALT 13  ALKPHOS 119  BILITOT 1.7*   PT/INR Recent Labs    09/03/21 1217  LABPROT 15.5*  INR 1.2    C-Diff Recent Labs    09/04/21 1040  CDIFFTOX NEGATIVE    Studies/Results: US Paracentesis  Result Date: 09/04/2021 INDICATION: Cirrhosis, ascites EXAM: ULTRASOUND GUIDED DIAGNOSTIC AND THERAPEUTIC PARACENTESIS MEDICATIONS: None. COMPLICATIONS: None immediate. PROCEDURE: Informed written consent was obtained from the patient after a discussion of the risks, benefits and alternatives to treatment. A timeout was performed prior to the initiation of the procedure. Initial ultrasound scanning demonstrates a large amount of ascites within the right lower abdominal quadrant. The right lower abdomen was prepped and draped in the usual sterile fashion. 1% lidocaine was used for local anesthesia. Following this, a 5 French catheter was introduced. An ultrasound image was saved for documentation purposes. The paracentesis was performed. The catheter was removed and a dressing was applied. The patient tolerated the procedure well without immediate post procedural complication. Patient received post-procedure intravenous albumin; see nursing notes for details. FINDINGS: A total of approximately 6 L of yellow ascitic fluid was removed. Samples were sent to the laboratory as requested by the clinical team. IMPRESSION: Successful ultrasound-guided paracentesis yielding 6 liters of peritoneal fluid. Electronically Signed   By: Lavonia Dana M.D.   On: 09/04/2021 13:24    Impression:  Cheryl Jordan is a very pleasant 81 y.o. 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 GI consulted due to concern for development of hepatorenal syndrome.   NASH cirrhosis: MELD NA 23. She is in end stages of disease. Difficult scenario with refractory ascites and unable to tolerate diuretic therapy. She has had multiple paras. TIPS discussion was had during prior hospitalization in Feb 2023, with plans to return to IR clinic in a month. Limited options at this point other than paras as needed; however, this will worsen/hasten renal decline. Highly recommend palliative involvement as limited interventions remain in setting of end-stage disease. Will receive Albumin TID.   Acute on chronic renal disease: concern for evolving hepatorenal syndrome.. Consider addition of octreotide tomorrow if necessary. May need to consult Nephrology if worsening status.   Diarrhea: Cdiff antigen positive, toxin negative, PCR pending. She has had 2 mush-like stools today. No diarrhea. Await PCR.   Anemia: multifactorial in setting of IDA and chronic disease. EGD recently on file. No overt GI bleeding. Colonoscopy in 2021.    Plan: UA Albumin 50 g IV TID CBC, CMP, INR in am May need addition of octreotide Consider Nephrology involvement if worsening renal status Agree with palliative consultation Supportive measures Will add Zofran for nausea  Leandra Kern.  Cyndi Bender, PhD, ANP-BC [facility]      LOS: 1 day    09/05/2021, 3:26 PM

## 2021-09-06 DIAGNOSIS — Z515 Encounter for palliative care: Secondary | ICD-10-CM

## 2021-09-06 DIAGNOSIS — D638 Anemia in other chronic diseases classified elsewhere: Secondary | ICD-10-CM

## 2021-09-06 DIAGNOSIS — R197 Diarrhea, unspecified: Secondary | ICD-10-CM

## 2021-09-06 DIAGNOSIS — Z66 Do not resuscitate: Secondary | ICD-10-CM

## 2021-09-06 DIAGNOSIS — Z7189 Other specified counseling: Secondary | ICD-10-CM

## 2021-09-06 DIAGNOSIS — R188 Other ascites: Secondary | ICD-10-CM

## 2021-09-06 LAB — URINALYSIS, ROUTINE W REFLEX MICROSCOPIC
Bilirubin Urine: NEGATIVE
Glucose, UA: NEGATIVE mg/dL
Ketones, ur: NEGATIVE mg/dL
Nitrite: NEGATIVE
Protein, ur: NEGATIVE mg/dL
Specific Gravity, Urine: 1.013 (ref 1.005–1.030)
pH: 5 (ref 5.0–8.0)

## 2021-09-06 LAB — PREPARE RBC (CROSSMATCH)

## 2021-09-06 LAB — CBC WITH DIFFERENTIAL/PLATELET
Abs Immature Granulocytes: 0 10*3/uL (ref 0.00–0.07)
Basophils Absolute: 0 10*3/uL (ref 0.0–0.1)
Basophils Relative: 1 %
Eosinophils Absolute: 0 10*3/uL (ref 0.0–0.5)
Eosinophils Relative: 2 %
HCT: 23.3 % — ABNORMAL LOW (ref 36.0–46.0)
Hemoglobin: 6.9 g/dL — CL (ref 12.0–15.0)
Immature Granulocytes: 0 %
Lymphocytes Relative: 39 %
Lymphs Abs: 0.8 10*3/uL (ref 0.7–4.0)
MCH: 27.4 pg (ref 26.0–34.0)
MCHC: 29.6 g/dL — ABNORMAL LOW (ref 30.0–36.0)
MCV: 92.5 fL (ref 80.0–100.0)
Monocytes Absolute: 0.1 10*3/uL (ref 0.1–1.0)
Monocytes Relative: 7 %
Neutro Abs: 1 10*3/uL — ABNORMAL LOW (ref 1.7–7.7)
Neutrophils Relative %: 51 %
Platelets: 69 10*3/uL — ABNORMAL LOW (ref 150–400)
RBC: 2.52 MIL/uL — ABNORMAL LOW (ref 3.87–5.11)
RDW: 16 % — ABNORMAL HIGH (ref 11.5–15.5)
WBC: 2 10*3/uL — ABNORMAL LOW (ref 4.0–10.5)
nRBC: 0 % (ref 0.0–0.2)

## 2021-09-06 LAB — BASIC METABOLIC PANEL
Anion gap: 7 (ref 5–15)
BUN: 42 mg/dL — ABNORMAL HIGH (ref 8–23)
CO2: 19 mmol/L — ABNORMAL LOW (ref 22–32)
Calcium: 9.2 mg/dL (ref 8.9–10.3)
Chloride: 112 mmol/L — ABNORMAL HIGH (ref 98–111)
Creatinine, Ser: 2.93 mg/dL — ABNORMAL HIGH (ref 0.44–1.00)
GFR, Estimated: 16 mL/min — ABNORMAL LOW (ref >60)
Glucose, Bld: 77 mg/dL (ref 70–99)
Potassium: 3.9 mmol/L (ref 3.5–5.1)
Sodium: 138 mmol/L (ref 135–145)

## 2021-09-06 LAB — HEPATIC FUNCTION PANEL
ALT: 8 U/L (ref 0–44)
AST: 17 U/L (ref 15–41)
Albumin: 4.2 g/dL (ref 3.5–5.0)
Alkaline Phosphatase: 49 U/L (ref 38–126)
Bilirubin, Direct: 0.5 mg/dL — ABNORMAL HIGH (ref 0.0–0.2)
Indirect Bilirubin: 0.7 mg/dL (ref 0.3–0.9)
Total Bilirubin: 1.2 mg/dL (ref 0.3–1.2)
Total Protein: 5.6 g/dL — ABNORMAL LOW (ref 6.5–8.1)

## 2021-09-06 LAB — PROTIME-INR
INR: 1.7 — ABNORMAL HIGH (ref 0.8–1.2)
Prothrombin Time: 20.1 seconds — ABNORMAL HIGH (ref 11.4–15.2)

## 2021-09-06 MED ORDER — ONDANSETRON HCL 4 MG/2ML IJ SOLN: 4.0000 mg | Freq: Once | INTRAMUSCULAR | Status: AC

## 2021-09-06 MED ORDER — OCTREOTIDE ACETATE 100 MCG/ML IJ SOLN
100.0000 ug | Freq: Two times a day (BID) | INTRAMUSCULAR | Status: DC
  Administered 2021-09-06 – 2021-09-09 (×6): 100 ug via SUBCUTANEOUS
  Filled 2021-09-06 (×10): qty 1

## 2021-09-06 MED ORDER — VANCOMYCIN HCL 125 MG PO CAPS
125.0000 mg | ORAL_CAPSULE | Freq: Four times a day (QID) | ORAL | Status: DC
  Administered 2021-09-06 – 2021-09-09 (×10): 125 mg via ORAL
  Filled 2021-09-06 (×20): qty 1

## 2021-09-06 MED ORDER — MIDODRINE HCL 5 MG PO TABS
5.0000 mg | ORAL_TABLET | Freq: Three times a day (TID) | ORAL | Status: DC
  Administered 2021-09-07 – 2021-09-09 (×7): 5 mg via ORAL
  Filled 2021-09-06 (×7): qty 1

## 2021-09-06 MED ORDER — SODIUM CHLORIDE 0.9% IV SOLUTION: Freq: Once | INTRAVENOUS | Status: AC

## 2021-09-06 NOTE — Progress Notes (Signed)
Patient received 1 unit of blood this shift. Patient tolerated well with no adverse effects noted or reported. Patient alert and verbal. Patient assisted from bed to [facility]. Family requested to speak with MD regarding patient. MD Courage made aware. MD contacted family via phone.

## 2021-09-06 NOTE — Progress Notes (Signed)
Subjective: Pateint receiving 1 unit PRBCs currently. Nurse reports 3 solid stools yesterday without blood or melena, no episodes of diarrhea. Patient denies any current abdominal pain, has had no nausea or vomiting today. Does not feel that abdomen is more swollen today. No episodes of confusion.  Objective: Vital signs in last 24 hours: Temp:  [98 F (36.7 C)-99.5 F (37.5 C)] 98 F (36.7 C) (02/22 0547) Pulse Rate:  [78-94] 94 (02/22 0617) Resp:  [16-20] 16 (02/22 0617) BP: (111-125)/(47-53) 125/52 (02/22 0617) SpO2:  [97 %] 97 % (02/22 0617) Last BM Date : 09/06/21 General:   Alert and oriented. Flat affect Head:  Normocephalic and atraumatic. Eyes:  No icterus, sclera clear. Conjuctiva pink.  Mouth:  Without lesions, mucosa pink and moist.   Heart:  S1, S2 present, no murmurs noted.  Lungs: Clear to auscultation bilaterally, without wheezing, rales, or rhonchi.  Abdomen:  Bowel sounds present. Abdomen full/distended but soft, non-tender, No HSM or hernias noted. No rebound or guarding. No masses appreciated  Msk:  Symmetrical without gross deformities. Normal posture. Pulses:  Normal pulses noted. Extremities:  Without clubbing or edema. Neurologic:  Alert and  oriented x4;  grossly normal neurologically. Skin:  Warm and dry, intact without significant lesions.  Psych:  Alert and cooperative. Flat affect  Intake/Output from previous day: 02/21 0701 - 02/22 0700 In: 780.6 [P.O.:540; I.V.:40.4; IV Piggyback:200.2] Out: 200 [Urine:200] Intake/Output this shift: No intake/output data recorded.  Lab Results: Recent Labs    09/03/21 1217 09/04/21 0524 09/06/21 0445  WBC 4.8 4.0 2.0*  HGB 10.6* 8.3* 6.9*  HCT 33.9* 26.9* 23.3*  PLT 98* 82* 69*   BMET Recent Labs    09/04/21 0524 09/05/21 1558 09/06/21 0445  NA 139 137 138  K 4.3 3.9 3.9  CL 114* 112* 112*  CO2 17* 19* 19*  GLUCOSE 84 174* 77  BUN 47* 43* 42*  CREATININE 3.10* 3.20* 2.93*  CALCIUM 8.4* 8.6*  9.2   LFT Recent Labs    09/03/21 1217 09/05/21 1558 09/06/21 0445  PROT 5.8* 5.1* 5.6*  ALBUMIN 2.6* 3.6 4.2  AST 29 21 17   ALT 13 9 8   ALKPHOS 119 63 49  BILITOT 1.7* 1.0 1.2  BILIDIR  --   --  0.5*  IBILI  --   --  0.7   PT/INR Recent Labs    09/05/21 1558 09/06/21 0445  LABPROT 17.5* 20.1*  INR 1.4* 1.7*    US Paracentesis  Result Date: 09/04/2021 INDICATION: Cirrhosis, ascites EXAM: ULTRASOUND GUIDED DIAGNOSTIC AND THERAPEUTIC PARACENTESIS MEDICATIONS: None. COMPLICATIONS: None immediate. PROCEDURE: Informed written consent was obtained from the patient after a discussion of the risks, benefits and alternatives to treatment. A timeout was performed prior to the initiation of the procedure. Initial ultrasound scanning demonstrates a large amount of ascites within the right lower abdominal quadrant. The right lower abdomen was prepped and draped in the usual sterile fashion. 1% lidocaine was used for local anesthesia. Following this, a 5 French catheter was introduced. An ultrasound image was saved for documentation purposes. The paracentesis was performed. The catheter was removed and a dressing was applied. The patient tolerated the procedure well without immediate post procedural complication. Patient received post-procedure intravenous albumin; see nursing notes for details. FINDINGS: A total of approximately 6 L of yellow ascitic fluid was removed. Samples were sent to the laboratory as requested by the clinical team. IMPRESSION: Successful ultrasound-guided paracentesis yielding 6 liters of peritoneal fluid. Electronically Signed   By:  Lavonia Dana M.D.   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 GI consulted due to concern for development of hepatorenal syndrome.   NASH Cirrhosis: MELD-Na 24.  appears patient is in end stages of disease, unable to tolerate diuretic therapy for her ascites, has had multiple paracenteses, last was yesterday yielding 6L, SBP negative. INR 1.7, plt 69k. TIPS discussed during previous hospitalization in early February with plans to return to IR clinic in 1 month, however, given her current MELD-Na, not a candidate for TIPS at this time. On Albumin Infusion for 48 hours then transitioned to Albumin 50g IV TID yesterday as renal function was worsening, though patient continues to have ascites, without much improvement in renal function today. Considerations of adding octreotide infusion may be beneficial at this time, will discuss further with Dr. Laural Golden regarding his recommendations on this. Remains without alterations in mental status. palliative care involvement appreciated.   Acute on chronic renal disease: concern for hepatorenal syndrome. BUN 42, creatnine 2.93 today, relatively unchanged from yesterday. Not much improvement after >48 hrs of IV albumin.   C-Difficile: C diff antigen positive, toxin negative, PCR also positive though patient is having formed stools without any occurrence of diarrhea per nursing staff. Will hold off on treating at this time, unless she develops diarrhea.   Anemia: hgb down to 6.9 this morning from 8.3 yesterday, MCV remains 92.5, multifactorial in setting of previous IDA diagnosis and chronic disease. EGD in jan 2023 r/t melena with grade II esophageal varices, GAVE w/o bleeding and moderate portal hypertensive gastropathy, it was queried if GAVE vs PHG was source of bleeding at that time. colonoscopy in 2021 for IDA secondary to hematochezia without identified bleeding source. No overt GI bleeding this  admission, will check occult stool card for presence of blood. Receiving 1 unit PRBCs this morning. Iron studies would be null and void at this time, given previous blood transfusions.   Plan: Continue Albumin 50 g IV TID Consider octreotide infusion POC occult stool card Nephrology involvment recommended for ongoing renal failure Continue Supportive measures Continue zofran for nausea PRN Agree with palliative care discussion   LOS: 2 days    09/06/2021, 9:31 AM   Rhyan Radler L. Alver Sorrow, MSN, APRN, AGNP-C Adult-Gerontology Nurse Practitioner [facility]

## 2021-09-06 NOTE — TOC Initial Note (Signed)
Transition of Care [facility]) - Initial/Assessment Note    Patient Details  Name: Cheryl Jordan MRN: [id number] Date of Birth: 1941-03-16  Transition of Care [facility]) CM/SW Contact:    Salome Arnt, LCSW Phone Number: 09/06/2021, 12:43 PM  Clinical Narrative:  Pt admitted with weakness and decompensated hepatic cirrhosis. Assessment completed due to high risk readmission score. Pt reports she lives with her husband. She ambulates with a walker at baseline. PT evaluated pt and recommend home health. LCSW discussed with pt who is agreeable with no preference on agency. [facility] to [facility], and [facility]. [facility] unable to accept. [facility] may be able to take pt, but would not be able to start until next week.   *Update: Per Tommi Rumps with [facility], they accepted pt from [facility] last week. However, when they contacted pt she refused all services. Will continue to seek home health for pt.                Expected Discharge Plan: [place] Barriers to Discharge: Continued Medical Work up   Patient Goals and CMS Choice Patient states their goals for this hospitalization and ongoing recovery are:: return home   Choice offered to / list presented to : Patient  Expected Discharge Plan and Services Expected Discharge Plan: [place] In-house Referral: Clinical Social Work   Post Acute Care Choice: [place] arrangements for the past 2 months: [place]                                      Prior Living Arrangements/Services Living arrangements for the past 2 months: Single Family Home Lives with:: Spouse Patient language and need for interpreter reviewed:: Yes Do you feel safe going back to the place where you live?: Yes          Current home services: DME (walker, [facility]) Criminal Activity/Legal Involvement Pertinent to Current Situation/Hospitalization: No - Comment as needed  Activities of Daily Living Home  Assistive Devices/Equipment: Cane (specify quad or straight), Walker (specify type) ADL Screening (condition at time of admission) Patient's cognitive ability adequate to safely complete daily activities?: Yes Is the patient deaf or have difficulty hearing?: Yes Does the patient have difficulty seeing, even when wearing glasses/contacts?: No Does the patient have difficulty concentrating, remembering, or making decisions?: No Patient able to express need for assistance with ADLs?: Yes Does the patient have difficulty dressing or bathing?: No Independently performs ADLs?: Yes (appropriate for developmental age) Does the patient have difficulty walking or climbing stairs?: Yes Weakness of Legs: Both Weakness of Arms/Hands: Both  Permission Sought/Granted                  Emotional Assessment   Attitude/Demeanor/Rapport: Engaged Affect (typically observed): Accepting Orientation: : Oriented to Self, Oriented to Place, Oriented to  Time, Oriented to Situation Alcohol / Substance Use: Not Applicable Psych Involvement: No (comment)  Admission diagnosis:  Weakness [R53.1] Other ascites [R18.8] Generalized weakness [R53.1] Patient Active Problem List   Diagnosis Date Noted   Weakness 09/03/2021   Anemia of chronic disease 09/03/2021   Decompensated [place] cirrhosis ([place])    COVID-19 virus infection 08/20/2021   Abdominal ascites 08/20/2021   Failure to thrive in adult 08/18/2021   GAVE (gastric antral vascular ectasia)    Iron deficiency anemia due to chronic blood loss    Secondary  esophageal varices without bleeding (HCC)    Portal hypertensive gastropathy (HCC)    Intractable nausea and vomiting 08/09/2021   Pancytopenia ([place]) 08/09/2021   Diarrhea 08/08/2021   Protein-calorie malnutrition, severe 08/08/2021   Hepatoma ([place]) 03/08/2021   Volume overload 07/04/2020   Decompensated hepatic cirrhosis (HCC)    Deep venous thrombosis (HCC)    Acute blood loss anemia    Colon  ulcer    GI bleed 06/30/2020   GI bleeding 06/16/2020   GIB (gastrointestinal bleeding) 06/15/2020   SOB (shortness of breath) 05/10/2020   Symptomatic anemia 05/10/2020   Enteritis 04/29/2020   Acute renal failure superimposed on stage 3b chronic kidney disease ([place]) 04/29/2020   Abnormal finding on imaging of liver 02/01/2019   Malignant neoplasm of colon ([place]) 08/11/2015   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[place]) 08/11/2015   Adenocarcinoma of colon ([place]) 01/05/2015   Hypertension associated with diabetes ([place]) 12/17/2013   Arthritis 12/17/2013   Hypothyroidism 10/16/2011   GERD (gastroesophageal reflux disease) 10/16/2011   PCP:  Wannetta Sender, FNP Pharmacy:   [place], [place] [address] Phone: [phone] Fax: [phone]  [id number] - [place], [place] [address] [place] [address] Phone: [phone] Fax: [phone], [place], [place] - [address] [place] [address] Phone: [phone] Fax: [place], [place] [address] Phone: [phone] Fax: [phone]  [facility] ([place]) - [place], [place] [facility] [address] [place] [address] [place] [address] Phone: [phone] Fax: [phone]     Social Determinants of Health ([place]) Interventions    Readmission Risk Interventions Readmission Risk Prevention Plan 09/06/2021 08/10/2021 05/11/2020  Transportation Screening Complete Complete Complete  PCP or Specialist Appt within 5-7 Days - - Complete  Home Care Screening - - Complete  Medication Review (RN Care Manager) Complete Complete -  PCP or Specialist appointment within 3-5 days of discharge - Complete -  [place] or Home Care Consult Complete Complete -  SW Recovery Care/Counseling  Consult Complete Complete -  Palliative Care Screening Not Applicable Not Applicable -  [place] Not Applicable Not Applicable -  Some recent data might be hidden

## 2021-09-06 NOTE — Consult Note (Signed)
Palliative Care Consult Note                                  Date: 09/06/2021   Patient Name: Cheryl Jordan  DOB: 1940-07-26  MRN: [id number]  Age / Sex: 81 y.o., female  PCP: Cheryl Sender, FNP Referring Physician: Roxan Hockey, MD  Reason for Consultation: Establishing goals of care  HPI/Patient Profile: 81 y.o. female  with past medical history of liver cirrhosis due to NASH, colon cancer, CKD stage 3b, T2DM, severe protein calorie malnutrition, who had a recent hospitalization 08/18/21 to 08/24/21 for SARS COVID 19 infection and decompensated liver cirrhosis. She presented for progressive weakness, poor oral intake, abdominal distension, and diarrhea. She was admitted on 09/03/2021 with weakness, anemia of chronic disease, severe malnutrition, diarrhea (? CDiff), decompensated cirrhosis, AKI on CKD (? HRS), and others.  PMT was consulted for Cheryl Jordan discussions.   Past Medical History:  Diagnosis Date   Allergy    Anemia    hx of   Anxiety    Aortic stenosis    mild by 04/08/20 echo   Arthritis    Colon cancer ([place]) dx'd 11/2014   Diabetes mellitus without complication (HCC)    TYPE II   Dyspnea    Fatty liver    Gallbladder disease    GERD (gastroesophageal reflux disease)    Heart murmur    History of gout    Hypertension    Hypothyroidism    Kidney disorder    spot on left kidney   Osteoporosis    PONV (postoperative nausea and vomiting)     Subjective:   This NP Cheryl Jordan reviewed medical records, received report from team, assessed the patient and then meet at the patient's bedside to discuss diagnosis, prognosis, GOC, EOL wishes disposition and options.  I met with the patient at the bedside. We were later joined by her sister-in-law Cheryl Jordan.   Concept of Palliative Care was introduced as specialized medical care for people and their families living with serious illness.  If focuses on providing relief from  the symptoms and stress of a serious illness.  The goal is to improve quality of life for both the patient and the family. Values and goals of care important to patient and family were attempted to be elicited.  Created space and opportunity for patient  and family to explore thoughts and feelings regarding current medical situation   Natural trajectory and current clinical status were discussed. Questions and concerns addressed. Patient  encouraged to call with questions or concerns.    Patient/Family Understanding of Illness: She understands her health is not doing too good.  Her kidneys are not getting better and that her kidney problems are because of her liver.  She has required weekly paracentesis.  She was previously offered a TIPS procedure and is not sure about it.  We had an extensive discussion about liver disease especially ESLD complicated by AKI/HRS, limited treatment options.  We discussed disease progression and trajectory as liver disease worsens.  We discussed issues that patient's face at the end of life.  Life Review: The patient worked for [employer] for 9 years and on and off for [employer] for 16 years.  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  She has been married for 59 years.  She has a son Cheryl Jordan who is 17 years old and another son who  is 55 years old who lives in [place].  Things that bring her joy are living with her family, dinner out on Saturday nights.  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  She also enjoys gardening and gospel music.  I offered a chaplain referral but she declined as her minister has been coming to visit her.  Patient Values: Family, faith.  She especially values her relationship with her 2 granddaughters (which caused her to smile widely).  1 daughter lives in [place] and is 82 years old and is finishing a residency in dental implants and restorative treatment.  Her other daughter is 68 years  old and currently attends [employer] for nursing.  Goals: To try to have the TIPS procedure to see if she can get any improvement.  It was mentioned to her that this would be pending candidacy for the procedure.  Today's Discussion: In addition to the extensive clinical discussion, as described above, we discussed the patient's current state and other issues related to her care.  She does admit having some nausea and having giving her some nausea medicines which helps somewhat but not much.  We discussed the previous recommendation for palliative medicine as an outpatient, but she states she was not home and long enough for them to become involved.  We discussed the nature of liver disease and she agreed that she has been told that there are not many treatment options left.  We discussed that her current liver disease is not curable and that it will continue to progress.  At some point she will be approaching the end of life and we will need to have continued and ongoing discussions about what her goals are as she reaches each of these decision points.  She verbalized agreement.  I provided emotional general support through therapeutic listening, empathy, sharing of stories, reminiscing, and other techniques.  I answered all questions and addressed all concerns to the best of my ability.  Review of Systems  Constitutional:  Positive for appetite change.  Respiratory:  Negative for cough and shortness of breath.   Cardiovascular:  Negative for chest pain.  Gastrointestinal:  Positive for abdominal distention (Not as bad as it was) and nausea. Negative for abdominal pain.       Early satiety   Objective:   Primary Diagnoses: Present on Admission:  Hypothyroidism  GERD (gastroesophageal reflux disease)  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HCC)  Protein-calorie malnutrition, severe  Decompensated hepatic cirrhosis (HCC)  Adenocarcinoma of colon (HCC)  Acute renal failure  superimposed on stage 3b chronic kidney disease ([place])  Diarrhea  Anemia of chronic disease   Physical Exam Vitals and nursing note reviewed.  Constitutional:      General: She is not in acute distress.    Appearance: She is ill-appearing.  HENT:     Head: Normocephalic and atraumatic.  Cardiovascular:     Rate and Rhythm: Normal rate.  Pulmonary:     Effort: Pulmonary effort is normal. No respiratory distress.     Breath sounds: No wheezing or rhonchi.  Abdominal:     General: Abdomen is flat.     Palpations: Abdomen is soft.  Skin:    General: Skin is warm and dry.  Neurological:     General: No focal deficit present.     Mental Status: She is alert.  Psychiatric:        Mood and Affect: Mood normal.  Behavior: Behavior normal.    Vital Signs:  BP (!) 129/56    Pulse 92    Temp 97.8 F (36.6 C) (Oral)    Resp 18    Ht _0  (1.702 m)    Wt 80.8 kg    SpO2 96%    BMI 27.91 kg/m   Palliative Assessment/Data: 40%    Advanced Care Planning:   Primary Decision Maker: PATIENT  Code Status/Advance Care Planning: DNR  A discussion was had today regarding advanced directives. Concepts specific to code status, artifical feeding and hydration, continued IV antibiotics and rehospitalization was had.  The difference between a aggressive medical intervention path and a palliative comfort care path for this patient at this time was had.   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  Previously had hepatocellular carcinoma status post ablation of lesions.  She understands her health is getting worse and her kidneys took a hit and are not getting better.  We discussed that there are limited treatment options.  However, she would like to try TIPS procedure if she is able.  We discussed the risk of  progressive worsening, frequent metabolic encephalopathy and she understands.  Outside of TIPS procedure (which may not be possible) there are limited treatment options.  We discussed need for ongoing goals of care discussions as she reached a decision points in as her clinical picture evolves.  Overall prognosis remains quite poor.  SUMMARY OF RECOMMENDATIONS   Continue DNR Treat the treatable Would like to try TIPS procedure if possible Continued GOC discussions as clinical picture evolves PMT will continue to follow  Symptom Management:  Per primary team PMT is available to assist as needed  Prognosis:  Unable to determine  Discharge Planning:  To Be Determined   Discussed with: Patient, family, medical team, nursing team, [facility] team    Thank you for allowing [place] to participate in the care of BRESLYN ABDO PMT will continue to support holistically.  Time Total: 90 min  Greater than 50%  of this time was spent counseling and coordinating care related to the above assessment and plan.  Signed by: Cheryl Field, NP Palliative Medicine Team  Team Phone # [phone] (Nights/Weekends)  09/06/2021, 1:13 PM

## 2021-09-06 NOTE — Plan of Care (Signed)
°  Problem: Acute Rehab PT Goals(only PT should resolve) Goal: Pt Will Go Supine/Side To Sit Outcome: Progressing Flowsheets (Taken 09/06/2021 1426) Pt will go Supine/Side to Sit:  with supervision  with min guard assist Goal: Patient Will Transfer Sit To/From Stand Outcome: Progressing Flowsheets (Taken 09/06/2021 1426) Patient will transfer sit to/from stand: with supervision Goal: Pt Will Transfer Bed To Chair/Chair To Bed Outcome: Progressing Flowsheets (Taken 09/06/2021 1426) Pt will Transfer Bed to Chair/Chair to Bed: with supervision Goal: Pt Will Ambulate Outcome: Progressing Flowsheets (Taken 09/06/2021 1426) Pt will Ambulate:  75 feet  with supervision  with min guard assist  with rolling walker   2:27 PM, 09/06/21 Lonell Grandchild, MPT Physical Therapist with [facility] [phone] office [phone] mobile phone

## 2021-09-06 NOTE — Progress Notes (Signed)
PROGRESS NOTE     Cheryl Jordan, is a 81 y.o. female, DOB - 02-Jun-1941, DXI:[id number]  Admit date - 09/03/2021   Admitting Physician Mauricio Gerome Apley, MD  Outpatient Primary MD for the patient is Wannetta Sender, FNP  LOS - 2  Chief Complaint  Patient presents with   Weakness        Brief Narrative:  81 y.o. female with medical history significant of liver cirrhosis due to NASH, colon cancer, CKD stage 3b, T2DM, severe protein calorie malnutrition, who had a recent hospitalization 08/18/21 to 08/24/21 for SARS COVID 19 infection and decompensated liver cirrhosis    -Assessment and Plan:  1)Decompensated Liver Cirrhosis--  --Patient with recurrent ascites.  -IR evaluation last hospitalization she was found candidate for TIPS, but she did request more time before consenting for the procedure. MELD score too high at this time to allow for TIPS -Status post 6 L ascitic fluid removal with paracentesis. =-GI consult appreciated received albumin infusions  2)C diff colitis--discussed with Dr. Laural Golden patient presented with diarrhea will treat with oral Vanco for 10 days  3)Anemia and thrombocytopenia of chronic disease/liver cirrhosis-Hgb down to 6.9, -Platelets down to 69 -Stop Lovenox -Transfuse 1 units of PRBC -INR is 1.7 -No obvious bleeding noted  4)Acute renal failure superimposed on stage 3b chronic kidney disease -Continue albumin infusions renally adjust medications, avoid nephrotoxic agents / dehydration  / hypotension  5) hypothyroidism--- continue levothyroxine  6)GERD--- continue Protonix  7)DM2-A1c is 4.8 reflecting excellent diabetic control PTA -Patient is at risk for hypoglycemic episodes Use Novolog/Humalog Sliding scale insulin with Accu-Cheks/Fingersticks as ordered   8) adenocarcinoma of the colon/concerns for hepatocellular carcinoma--outpatient follow-up with oncology advised  Disposition/Need for in-Hospital Stay- patient unable to be  discharged at this time due to --decompensated liver cirrhosis with worsening anemia and worsening thrombocytopenia requiring transfusions and albumin infusions -Anticipate possible discharge home in a couple days with home health PT  Status is: Inpatient   Disposition: The patient is from: Home              Anticipated d/c is to: Home with [facility] PT              Anticipated d/c date is: 2 days              Patient currently is not medically stable to d/c. Barriers: Not Clinically Stable-   Code Status :  -  Code Status: DNR   Family Communication:    (patient is alert, awake and coherent) Discussed with son david Savary---[phone]  DVT Prophylaxis  :   - SCDs   SCDs Start: 09/03/21 1809   Lab Results  Component Value Date   PLT 69 (L) 09/06/2021    Inpatient Medications  Scheduled Meds:  (feeding supplement) PROSource Plus  30 mL Oral TID BM   calcium carbonate  1 tablet Oral BID WC   feeding supplement  237 mL Oral TID BM   levothyroxine  88 mcg Oral Daily   melatonin  6 mg Oral QHS   [START ON 09/07/2021] midodrine  5 mg Oral TID WC   multivitamin with minerals  1 tablet Oral Daily   octreotide  100 mcg Subcutaneous Q12H   ondansetron (ZOFRAN) IV  4 mg Intravenous Q6H   pantoprazole  40 mg Oral BID AC   saccharomyces boulardii  250 mg Oral BID   sodium bicarbonate  325 mg Oral BID   sucralfate  1 g Oral BID  vancomycin  125 mg Oral QID   Continuous Infusions:  sodium chloride Stopped (09/06/21 0832)   PRN Meds:.sodium chloride, acetaminophen **OR** acetaminophen, benzonatate, calcium carbonate, LORazepam, ondansetron **OR** ondansetron (ZOFRAN) IV, prochlorperazine   Anti-infectives (From admission, onward)    Start     Dose/Rate Route Frequency Ordered Stop   09/06/21 1800  vancomycin (VANCOCIN) capsule 125 mg        125 mg Oral 4 times daily 09/06/21 1631 09-20-21 1759         Subjective: Sandi Beckett today has no fevers, no emesis,  No chest pain,    - Complains of fatigue and generalized weakness, -0 patient's son visited  Objective: Vitals:   09/06/21 0617 09/06/21 1017 09/06/21 1047 09/06/21 1320  BP: (!) 125/52 (!) 125/51 (!) 129/56 130/67  Pulse: 94 99 92 91  Resp: 16 18 18 18   Temp:  97.8 F (36.6 C) 97.8 F (36.6 C) 98 F (36.7 C)  TempSrc:  Oral Oral Oral  SpO2: 97% 99% 96% 96%  Weight:      Height:        Intake/Output Summary (Last 24 hours) at 09/06/2021 1730 Last data filed at 09/06/2021 1648 Gross per 24 hour  Intake 990.01 ml  Output 300 ml  Net 690.01 ml   Filed Weights   09/03/21 1143 09/03/21 1633  Weight: 84.4 kg 80.8 kg    Physical Exam  Gen:- Awake Alert, no acute distress HEENT:- [place].AT, No sclera icterus Neck-Supple Neck,No JVD,.  Lungs-  CTAB , fair symmetrical air movement CV- S1, S2 normal, regular  Abd-  +ve B.Sounds, Abd Soft, No tenderness, increased truncal adiposity/ascites Extremity/Skin:- pedal pulses present  Psych-affect is appropriate, oriented x3 Neuro-generalized weakness no new focal deficits, no tremors  Data Reviewed: I have personally reviewed following labs and imaging studies  CBC: Recent Labs  Lab 09/03/21 1217 09/04/21 0524 09/06/21 0445  WBC 4.8 4.0 2.0*  NEUTROABS 3.3  --  1.0*  HGB 10.6* 8.3* 6.9*  HCT 33.9* 26.9* 23.3*  MCV 92.4 91.8 92.5  PLT 98* 82* 69*   Basic Metabolic Panel: Recent Labs  Lab 09/03/21 1217 09/04/21 0524 09/05/21 1558 09/06/21 0445  NA 139 139 137 138  K 5.1 4.3 3.9 3.9  CL 112* 114* 112* 112*  CO2 18* 17* 19* 19*  GLUCOSE 110* 84 174* 77  BUN 48* 47* 43* 42*  CREATININE 3.37* 3.10* 3.20* 2.93*  CALCIUM 8.8* 8.4* 8.6* 9.2  MG 1.9  --   --   --    GFR: Estimated Creatinine Clearance: 17 mL/min (A) (by C-G formula based on SCr of 2.93 mg/dL (H)). Liver Function Tests: Recent Labs  Lab 09/03/21 1217 09/05/21 1558 09/06/21 0445  AST 29 21 17   ALT 13 9 8   ALKPHOS 119 63 49  BILITOT 1.7* 1.0 1.2  PROT 5.8* 5.1*  5.6*  ALBUMIN 2.6* 3.6 4.2   Cardiac Enzymes: No results for input(s): CKTOTAL, CKMB, CKMBINDEX, TROPONINI in the last 168 hours. BNP (last 3 results) No results for input(s): PROBNP in the last 8760 hours. HbA1C: No results for input(s): HGBA1C in the last 72 hours. Sepsis Labs: @LABRCNTIP (procalcitonin:4,lacticidven:4) ) Recent Results (from the past 240 hour(s))  Culture, blood (routine x 2)     Status: None (Preliminary result)   Collection Time: 09/03/21 12:18 PM   Specimen: Left Antecubital; Blood  Result Value Ref Range Status   Specimen Description   Final    LEFT ANTECUBITAL BOTTLES DRAWN AEROBIC AND ANAEROBIC  Special Requests   Final    Blood Culture results may not be optimal due to an excessive volume of blood received in culture bottles   Culture   Final    NO GROWTH 3 DAYS Performed at [facility], [address], [place], [place] [address]    Report Status PENDING  Incomplete  Culture, blood (routine x 2)     Status: None (Preliminary result)   Collection Time: 09/03/21 12:40 PM   Specimen: Right Antecubital; Blood  Result Value Ref Range Status   Specimen Description   Final    RIGHT ANTECUBITAL BOTTLES DRAWN AEROBIC AND ANAEROBIC   Special Requests Blood Culture adequate volume  Final   Culture   Final    NO GROWTH 3 DAYS Performed at [facility], [address] [place], [place] [address]    Report Status PENDING  Incomplete  C Difficile Quick Screen w PCR reflex     Status: Abnormal   Collection Time: 09/04/21 10:40 AM   Specimen: STOOL  Result Value Ref Range Status   C Diff antigen POSITIVE (A) NEGATIVE Final   C Diff toxin NEGATIVE NEGATIVE Final   C Diff interpretation Results are indeterminate. See PCR results.  Final    Comment: Performed at [facility], [address] [place], [place] [address]  Gram stain     Status: None   Collection Time: 09/04/21 11:29 AM   Specimen: Peritoneal Washings  Result Value Ref Range Status   Specimen  Description PERITONEAL  Final   Special Requests NONE  Final   Gram Stain   Final    WBC PRESENT,BOTH PMN AND MONONUCLEAR NO ORGANISMS SEEN CYTOSPIN SMEAR Performed at [facility], [address], [place], [place] [address]    Report Status 09/04/2021 FINAL  Final  Culture, body fluid w Gram Stain-bottle     Status: None (Preliminary result)   Collection Time: 09/04/21 11:30 AM   Specimen: Peritoneal Washings  Result Value Ref Range Status   Specimen Description PERITONEAL 10cc  Final   Special Requests   Final    BOTTLES DRAWN AEROBIC AND ANAEROBIC Blood Culture adequate volume   Culture   Final    NO GROWTH 2 DAYS Performed at [facility], [address], [place],  [address]    Report Status PENDING  Incomplete  C. Diff by PCR, Reflexed     Status: Abnormal   Collection Time: 09/05/21  3:09 PM  Result Value Ref Range Status   Toxigenic C. Difficile by PCR POSITIVE (A) NEGATIVE Final    Comment: Positive for toxigenic C. difficile with little to no toxin production. Only treat if clinical presentation suggests symptomatic illness. Performed at [facility], [place] [address], [place],  [address]       Radiology Studies: No results found.  Scheduled Meds:  (feeding supplement) PROSource Plus  30 mL Oral TID BM   calcium carbonate  1 tablet Oral BID WC   feeding supplement  237 mL Oral TID BM   levothyroxine  88 mcg Oral Daily   melatonin  6 mg Oral QHS   [START ON 09/07/2021] midodrine  5 mg Oral TID WC   multivitamin with minerals  1 tablet Oral Daily   octreotide  100 mcg Subcutaneous Q12H   ondansetron (ZOFRAN) IV  4 mg Intravenous Q6H   pantoprazole  40 mg Oral BID AC   saccharomyces boulardii  250 mg Oral BID   sodium bicarbonate  325 mg Oral  BID   sucralfate  1 g Oral BID   vancomycin  125 mg Oral QID   Continuous Infusions:  sodium chloride Stopped (09/06/21 0832)     LOS: 2 days    Roxan Hockey M.D on 09/06/2021 at 5:30 PM  Go to  [email or web address] - for contact info  [facility] - Office  [phone]  If 7PM-7AM, please contact night-coverage [email or web address] Password [facility] 09/06/2021, 5:30 PM

## 2021-09-06 NOTE — Evaluation (Signed)
Occupational Therapy Evaluation Patient Details Name: Cheryl Jordan MRN: [id number] DOB: 14-Oct-1940 Today's Date: 09/06/2021   History of Present Illness Cheryl Jordan is a 81 y.o. female with medical history significant of liver cirrhosis due to NASH, colon cancer, CKD stage 3b, T2DM, severe protein calorie malnutrition, who had a recent hospitalization 08/18/21 to 08/24/21 for SARS COVID 19 infection and decompensated liver cirrhosis. She was discharged home with home health services.      At home patient continue with progressive weakness, to the point she was not able to further ambulate independently. Very poor oral intake and worsening abdominal distention. Over last 2 to 3 days has been experiencing diarrhea that has been refractive to imodium.      Because of worsening weakness she was brought to the hospital for further evaluation. Denies any abdominal pain, nausea or vomiting, no dyspnea or chest pain.   Clinical Impression   Pt agreeable to OT and PT co-evaluation. Pt reports no pain with sings of weakness via slow labored movement. Pt able to complete bed mobility with modified independence with need for very slight assist to boost from EOB to stand with RW. Once up pt transferred with more min G assist and was able to sit on the [facility] and complete peri-care with set up assist. Pt reports needing moderate assist from toileting, bathing, and dressing at baseline prior to admission. Pt able to complete donning and doffing of socks without assist today. Pt left in bed with NT present. Pt will benefit from continued OT in the hospital and recommended venue below to increase strength, balance, and endurance for safe ADL's.         Recommendations for follow up therapy are one component of a multi-disciplinary discharge planning process, led by the attending physician.  Recommendations may be updated based on patient status, additional functional criteria and insurance authorization.   Follow Up  Recommendations  Home health OT    Assistance Recommended at Discharge Intermittent Supervision/Assistance  Patient can return home with the following A little help with walking and/or transfers;A little help with bathing/dressing/bathroom;Assistance with cooking/housework;Assist for transportation;Help with stairs or ramp for entrance    Functional Status Assessment  Patient has had a recent decline in their functional status and demonstrates the ability to make significant improvements in function in a reasonable and predictable amount of time.  Equipment Recommendations  None recommended by OT    Recommendations for Other Services       Precautions / Restrictions Precautions Precautions: Fall Restrictions Weight Bearing Restrictions: No      Mobility Bed Mobility Overal bed mobility: Modified Independent             General bed mobility comments: HOH elevated for supine to sit; sit to supine with bed flat.    Transfers Overall transfer level: Needs assistance Equipment used: Rolling walker (2 wheels) Transfers: Sit to/from Stand, Bed to chair/wheelchair/BSC Sit to Stand: Min guard, Min assist     Step pivot transfers: Min guard, Min assist     General transfer comment: Very slight assist for boost to stand from EOB. Once up pt needed min G assist. Able to boost from [facility] without physical assist using RW.      Balance Overall balance assessment: Needs assistance Sitting-balance support: Feet supported, No upper extremity supported Sitting balance-Leahy Scale: Good Sitting balance - Comments: seated EOB   Standing balance support: Bilateral upper extremity supported, During functional activity, Reliant on assistive device for balance Standing balance-Leahy  Scale: Fair Standing balance comment: fair to good with RW                           ADL either performed or assessed with clinical judgement   ADL Overall ADL's : Needs assistance/impaired      Grooming: Min guard;Supervision/safety;Standing;Wash/dry face;Wash/dry Nurse, mental health Details (indicate cue type and reason): per clinical judgement         Upper Body Dressing : Set up;Sitting;Modified independent   Lower Body Dressing: Modified independent;Sitting/lateral leans Lower Body Dressing Details (indicate cue type and reason): Pt able to don socks without physical assist. Toilet Transfer: Min guard;Minimal assistance;Stand-pivot;Rolling walker (2 wheels);BSC/3in1 Toilet Transfer Details (indicate cue type and reason): EOB to [facility] with RW; very slight assist to boost from bed. Toileting- Clothing Manipulation and Hygiene: Set up;Sitting/lateral lean Toileting - Clothing Manipulation Details (indicate cue type and reason): Pt completed peri-care with set up assist seated on BSC     Functional mobility during ADLs: Min guard;Rolling walker (2 wheels) General ADL Comments: Pt generally weak with slow labored movement.     Vision Baseline Vision/History: 1 Wears glasses;4 Cataracts Ability to See in Adequate Light: 1 Impaired (reading glassas and R eye cataract which was schedule surgery today per pt report.) Patient Visual Report: No change from baseline Vision Assessment?: No apparent visual deficits (other than baseline)                Pertinent Vitals/Pain Pain Assessment Pain Assessment: No/denies pain     Hand Dominance Right   Extremity/Trunk Assessment Upper Extremity Assessment Upper Extremity Assessment: Generalized weakness   Lower Extremity Assessment Lower Extremity Assessment: Defer to PT evaluation   Cervical / Trunk Assessment Cervical / Trunk Assessment: Normal   Communication Communication Communication: No difficulties   Cognition Arousal/Alertness: Awake/alert Behavior During Therapy: WFL for tasks assessed/performed Overall Cognitive Status: Within Functional Limits for tasks assessed                                                         Home Living Family/patient expects to be discharged to:: Private residence Living Arrangements: Spouse/significant other Available Help at Discharge: Family;Available PRN/intermittently Type of Home: House Home Access: Stairs to enter Entrance Stairs-Number of Steps: 1+1 Entrance Stairs-Rails: None Home Layout: One level;Laundry or work area in basement     [employer] Shower/Tub: Occupational psychologist: Handicapped height Bathroom Accessibility: Yes   Home Equipment: [place] - built Medical sales representative (2 wheels);Cane - single point   Additional Comments: husband uses SPC, she uses RW in home (Pt reported no change in history from previous admission.)      Prior Functioning/Environment Prior Level of Function : Needs assist       Physical Assist : Mobility (physical);ADLs (physical) Mobility (physical): Transfers;Gait;Stairs ADLs (physical): Bathing;Dressing;Toileting;IADLs Mobility Comments: Pt reported household ambulation with RW with assist to boost from the bed. ADLs Comments: Pt reports moderate assist for bathing, dressing, and toileting. Independent grooming and eating. Assisted with IADL's by family.        OT Problem List: Decreased strength;Decreased activity tolerance;Impaired balance (sitting and/or standing)      OT Treatment/Interventions: Self-care/ADL training;Therapeutic exercise;Therapeutic activities;Patient/family education;Balance training;Energy conservation    OT Goals(Current goals can be found in the care plan  section) Acute Rehab OT Goals Patient Stated Goal: return home OT Goal Formulation: With patient Time For Goal Achievement: 09/20/21 Potential to Achieve Goals: Good  OT Frequency: Min 1X/week    Co-evaluation PT/OT/SLP Co-Evaluation/Treatment: Yes Reason for Co-Treatment: To address functional/ADL transfers   OT goals addressed during session: ADL's and self-care                        End of Session Equipment Utilized During Treatment: Rolling walker (2 wheels) Nurse Communication: Other (comment) (NT present in room at end of session; discussed plan to stand pt with nurse with awareness of low hemoglobin.)  Activity Tolerance: Patient tolerated treatment well Patient left: in bed;with call bell/phone within reach;with nursing/sitter in room  OT Visit Diagnosis: Unsteadiness on feet (R26.81);Other abnormalities of gait and mobility (R26.89);Muscle weakness (generalized) (M62.81);Adult, failure to thrive (R62.7)                Time: 0998-3382 OT Time Calculation (min): 11 min Charges:  OT General Charges $OT Visit: 1 Visit OT Evaluation $OT Eval Low Complexity: 1 Low  Aiman Sonn OT, MOT  Larey Seat 09/06/2021, 9:09 AM

## 2021-09-06 NOTE — Plan of Care (Signed)
°  Problem: Acute Rehab OT Goals (only OT should resolve) Goal: Pt. Will Perform Grooming Flowsheets (Taken 09/06/2021 0911) Pt Will Perform Grooming:  with modified independence  standing Goal: Pt. Will Perform Lower Body Dressing Flowsheets (Taken 09/06/2021 0911) Pt Will Perform Lower Body Dressing: Independently Goal: Pt. Will Transfer To Toilet Flowsheets (Taken 09/06/2021 0911) Pt Will Transfer to Toilet:  with modified independence  ambulating Goal: Pt. Will Perform Toileting-Clothing Manipulation Flowsheets (Taken 09/06/2021 0911) Pt Will Perform Toileting - Clothing Manipulation and hygiene:  with modified independence  sitting/lateral leans Goal: Pt/Caregiver Will Perform Home Exercise Program Flowsheets (Taken 09/06/2021 0911) Pt/caregiver will Perform Home Exercise Program:  Increased strength  Both right and left upper extremity  Independently  Latronda Spink OT, MOT

## 2021-09-06 NOTE — Progress Notes (Signed)
Up to bedside commode twice last night.  C/O nausea but no vomiting and no bms

## 2021-09-06 NOTE — Progress Notes (Addendum)
HGB 6.9.  Dr. Beatriz Chancellor notified.  And ordered type and screen and one unit. prbc

## 2021-09-06 NOTE — Evaluation (Signed)
Physical Therapy Evaluation Patient Details Name: Cheryl Jordan MRN: [id number] DOB: 10-26-1940 Today's Date: 09/06/2021  History of Present Illness  Cheryl Jordan is a 81 y.o. female with medical history significant of liver cirrhosis due to NASH, colon cancer, CKD stage 3b, T2DM, severe protein calorie malnutrition, who had a recent hospitalization 08/18/21 to 08/24/21 for SARS COVID 19 infection and decompensated liver cirrhosis. She was discharged home with home health services.      At home patient continue with progressive weakness, to the point she was not able to further ambulate independently. Very poor oral intake and worsening abdominal distention. Over last 2 to 3 days has been experiencing diarrhea that has been refractive to imodium.      Because of worsening weakness she was brought to the hospital for further evaluation. Denies any abdominal pain, nausea or vomiting, no dyspnea or chest pain.   Clinical Impression  Patient functioning near baseline for functional mobility and gait and limited to bedside activities mostly due low HgB and preparing to receive blood transfusion.  Patient demonstrates good return for siting up at bedside and taking steps using RW to transfer to [facility].  Patient put back to bed per nursing staff request to receive blood after therapy.  Patient will benefit from continued skilled physical therapy in hospital and recommended venue below to increase strength, balance, endurance for safe ADLs and gait.         Recommendations for follow up therapy are one component of a multi-disciplinary discharge planning process, led by the attending physician.  Recommendations may be updated based on patient status, additional functional criteria and insurance authorization.  Follow Up Recommendations Home health PT    Assistance Recommended at Discharge Intermittent Supervision/Assistance  Patient can return home with the following  Help with stairs or ramp for entrance;A  little help with bathing/dressing/bathroom;Assistance with cooking/housework;A little help with walking and/or transfers    Equipment Recommendations None recommended by PT  Recommendations for Other Services       Functional Status Assessment Patient has had a recent decline in their functional status and demonstrates the ability to make significant improvements in function in a reasonable and predictable amount of time.     Precautions / Restrictions Precautions Precautions: Fall Restrictions Weight Bearing Restrictions: No      Mobility  Bed Mobility               General bed mobility comments: HOH elevated for supine to sit; sit to supine with bed flat.    Transfers Overall transfer level: Needs assistance Equipment used: Rolling walker (2 wheels) Transfers: Sit to/from Stand, Bed to chair/wheelchair/BSC Sit to Stand: Min guard, Min assist   Step pivot transfers: Min guard, Min assist       General transfer comment: increased time, labored movement    Ambulation/Gait Ambulation/Gait assistance: Min guard, Min assist Gait Distance (Feet): 6 Feet Assistive device: Rolling walker (2 wheels) Gait Pattern/deviations: Decreased step length - right, Decreased step length - left, Decreased stride length Gait velocity: decreased     General Gait Details: limited to a few slow slightly labored steps at bedside during transfer to [facility], limited mostly due preparing to have blood transfusion due to low HgB  Stairs            Wheelchair Mobility    Modified Rankin (Stroke Patients Only)       Balance Overall balance assessment: Needs assistance Sitting-balance support: Feet supported, No upper extremity supported Sitting balance-Leahy  Scale: Good Sitting balance - Comments: seated EOB   Standing balance support: During functional activity, Bilateral upper extremity supported Standing balance-Leahy Scale: Fair Standing balance comment: fair/good using  RW                             Pertinent Vitals/Pain Pain Assessment Pain Assessment: No/denies pain    Home Living Family/patient expects to be discharged to:: Private residence Living Arrangements: Spouse/significant other Available Help at Discharge: Family;Available PRN/intermittently Type of Home: House Home Access: Stairs to enter Entrance Stairs-Rails: None Entrance Stairs-Number of Steps: 1+1   Home Layout: One level;Laundry or work area in [place]: [place] (2 wheels);Cane - single point Additional Comments: husband uses SPC, she uses RW in home    Prior Function Prior Level of Function : Needs assist       Physical Assist : Mobility (physical);ADLs (physical) Mobility (physical): Bed mobility;Transfers;Gait;Stairs   Mobility Comments: assisted for sit to stands, household ambulator using RW ADLs Comments: Pt reports moderate assist for bathing, dressing, and toileting. Independent grooming and eating. Assisted with IADL's by family.     Hand Dominance   Dominant Hand: Right    Extremity/Trunk Assessment   Upper Extremity Assessment Upper Extremity Assessment: Defer to OT evaluation    Lower Extremity Assessment Lower Extremity Assessment: Generalized weakness    Cervical / Trunk Assessment Cervical / Trunk Assessment: Normal  Communication   Communication: No difficulties  Cognition Arousal/Alertness: Awake/alert Behavior During Therapy: WFL for tasks assessed/performed Overall Cognitive Status: Within Functional Limits for tasks assessed                                          General Comments      Exercises     Assessment/Plan    PT Assessment Patient needs continued PT services  PT Problem List Decreased strength;Decreased activity tolerance;Decreased balance;Decreased mobility       PT Treatment Interventions DME instruction;Gait training;Stair  training;Therapeutic activities;Therapeutic exercise;Functional mobility training;Patient/family education;Balance training    PT Goals (Current goals can be found in the Care Plan section)  Acute Rehab PT Goals Patient Stated Goal: return home with family to assist PT Goal Formulation: With patient Time For Goal Achievement: 09/11/21 Potential to Achieve Goals: Good    Frequency Min 3X/week     Co-evaluation PT/OT/SLP Co-Evaluation/Treatment: Yes Reason for Co-Treatment: To address functional/ADL transfers PT goals addressed during session: Mobility/safety with mobility;Balance;Proper use of DME         AM-PAC PT "6 Clicks" Mobility  Outcome Measure Help needed turning from your back to your side while in a flat bed without using bedrails?: A Little Help needed moving from lying on your back to sitting on the side of a flat bed without using bedrails?: A Little Help needed moving to and from a bed to a chair (including a wheelchair)?: A Little Help needed standing up from a chair using your arms (e.g., wheelchair or bedside chair)?: A Little Help needed to walk in hospital room?: A Little Help needed climbing 3-5 steps with a railing? : A Lot 6 Click Score: 17    End of Session   Activity Tolerance: Patient tolerated treatment well;Patient limited by fatigue Patient left: in bed;with call bell/phone within reach Nurse Communication: Mobility status PT Visit Diagnosis: Muscle weakness (generalized) (M62.81);Unsteadiness  on feet (R26.81);Other abnormalities of gait and mobility (R26.89)    Time: 4129-0475 PT Time Calculation (min) (ACUTE ONLY): 12 min   Charges:   PT Evaluation $PT Eval Low Complexity: 1 Low PT Treatments $Therapeutic Activity: 8-22 mins        2:25 PM, 09/06/21 Lonell Grandchild, MPT Physical Therapist with [facility] [phone] office [phone] mobile phone

## 2021-09-07 ENCOUNTER — Other Ambulatory Visit (HOSPITAL_COMMUNITY): Payer: Self-pay

## 2021-09-07 ENCOUNTER — Encounter: Payer: Self-pay | Admitting: Oncology

## 2021-09-07 LAB — PROTIME-INR
INR: 1.4 — ABNORMAL HIGH (ref 0.8–1.2)
Prothrombin Time: 17 seconds — ABNORMAL HIGH (ref 11.4–15.2)

## 2021-09-07 LAB — CBC WITH DIFFERENTIAL/PLATELET
Abs Immature Granulocytes: 0.01 10*3/uL (ref 0.00–0.07)
Basophils Absolute: 0 10*3/uL (ref 0.0–0.1)
Basophils Relative: 1 %
Eosinophils Absolute: 0.1 10*3/uL (ref 0.0–0.5)
Eosinophils Relative: 3 %
HCT: 32.4 % — ABNORMAL LOW (ref 36.0–46.0)
Hemoglobin: 10.1 g/dL — ABNORMAL LOW (ref 12.0–15.0)
Immature Granulocytes: 0 %
Lymphocytes Relative: 32 %
Lymphs Abs: 1.2 10*3/uL (ref 0.7–4.0)
MCH: 28.9 pg (ref 26.0–34.0)
MCHC: 31.2 g/dL (ref 30.0–36.0)
MCV: 92.8 fL (ref 80.0–100.0)
Monocytes Absolute: 0.2 10*3/uL (ref 0.1–1.0)
Monocytes Relative: 6 %
Neutro Abs: 2.1 10*3/uL (ref 1.7–7.7)
Neutrophils Relative %: 58 %
Platelets: 82 10*3/uL — ABNORMAL LOW (ref 150–400)
RBC: 3.49 MIL/uL — ABNORMAL LOW (ref 3.87–5.11)
RDW: 16 % — ABNORMAL HIGH (ref 11.5–15.5)
WBC: 3.6 10*3/uL — ABNORMAL LOW (ref 4.0–10.5)
nRBC: 0 % (ref 0.0–0.2)

## 2021-09-07 LAB — COMPREHENSIVE METABOLIC PANEL
ALT: 10 U/L (ref 0–44)
AST: 29 U/L (ref 15–41)
Albumin: 4.5 g/dL (ref 3.5–5.0)
Alkaline Phosphatase: 63 U/L (ref 38–126)
Anion gap: 9 (ref 5–15)
BUN: 43 mg/dL — ABNORMAL HIGH (ref 8–23)
CO2: 20 mmol/L — ABNORMAL LOW (ref 22–32)
Calcium: 9.7 mg/dL (ref 8.9–10.3)
Chloride: 109 mmol/L (ref 98–111)
Creatinine, Ser: 3.34 mg/dL — ABNORMAL HIGH (ref 0.44–1.00)
GFR, Estimated: 13 mL/min — ABNORMAL LOW (ref >60)
Glucose, Bld: 128 mg/dL — ABNORMAL HIGH (ref 70–99)
Potassium: 4.4 mmol/L (ref 3.5–5.1)
Sodium: 138 mmol/L (ref 135–145)
Total Bilirubin: 2.1 mg/dL — ABNORMAL HIGH (ref 0.3–1.2)
Total Protein: 6.3 g/dL — ABNORMAL LOW (ref 6.5–8.1)

## 2021-09-07 LAB — TYPE AND SCREEN
ABO/RH(D): A POS
Antibody Screen: NEGATIVE
Unit division: 0

## 2021-09-07 LAB — BPAM RBC
Blood Product Expiration Date: 202303212359
ISSUE DATE / TIME: 202302221025
Unit Type and Rh: 6200

## 2021-09-07 LAB — OCCULT BLOOD X 1 CARD TO LAB, STOOL: Fecal Occult Bld: POSITIVE — AB

## 2021-09-07 NOTE — TOC Transition Note (Signed)
Transition of Care [facility]) - CM/SW Discharge Note   Patient Details  Name: Cheryl Jordan MRN: [id number] Date of Birth: [birth year]  Transition of [facility]) CM/SW Contact:  Shade Flood, LCSW Phone Number: 09/07/2021, 3:36 PM   Clinical Narrative:     Per MD, pt may dc later today if cleared by GI. HH PT ordered by MD. Damaris Schooner with Tommi Rumps at [facility] and they had received a referral for [facility] for pt about 2 weeks ago when she discharged from [facility]. Pt reportedly refused HH when they attempted to schedule with her. Tommi Rumps states that they will accept referral and try again. [facility] info added to pt's AVS.  If pt does not dc today, TOC will be available tomorrow if needed.  Final next level of care: [place] Barriers to Discharge: Barriers Resolved   Patient Goals and CMS Choice Patient states their goals for this hospitalization and ongoing recovery are:: return home   Choice offered to / list presented to : Patient  Discharge Placement                       Discharge Plan and Services In-house Referral: Clinical Social Work   Post Acute Care Choice: Home Health                    HH Arranged: PT [facility] Agency: [place] Date [place]: 09/07/21   Representative spoke with at [place]: [place] ([place]) Interventions     Readmission Risk Interventions Readmission Risk Prevention Plan 09/06/2021 08/10/2021 05/11/2020  Transportation Screening Complete Complete Complete  PCP or Specialist Appt within 5-7 Days - - Complete  Home Care Screening - - Complete  Medication Review ([place]) Complete Complete -  PCP or Specialist appointment within 3-5 days of discharge - Complete -  [place] or Home Care Consult Complete Complete -  SW Recovery Care/Counseling Consult Complete Complete -  Palliative Care Screening Not Applicable Not Applicable -  [place] Not Applicable Not Applicable -  Some  recent data might be hidden

## 2021-09-07 NOTE — Progress Notes (Signed)
PROGRESS NOTE     Cheryl Jordan, is a 81 y.o. female, DOB - Jul 09, 1941, LYY:[id number]  Admit date - 09/03/2021   Admitting Physician Mauricio Gerome Apley, MD  Outpatient Primary MD for the patient is Wannetta Sender, FNP  LOS - 3  Chief Complaint  Patient presents with   Weakness        Brief Narrative:  81 y.o. female with medical history significant of liver cirrhosis due to NASH, colon cancer, CKD stage 3b, T2DM, severe protein calorie malnutrition, who had a recent hospitalization 08/18/21 to 08/24/21 for SARS COVID 19 infection and decompensated liver cirrhosis    -Assessment and Plan:  1)Decompensated Liver Cirrhosis--  --Patient with recurrent ascites.  -IR evaluation last hospitalization she was found candidate for TIPS, but she did request more time before consenting for the procedure. --MELD score too high at this time to allow for TIPS -Status post 6 L ascitic fluid removal with paracentesis on 09/04/21 =-GI consult appreciated received albumin infusions -Continue octreotide  2)C diff colitis--discussed with Dr. Laural Golden patient presented with diarrhea will treat with oral Vanco for 10 days (started 09/06/21)  3)Anemia and thrombocytopenia of chronic disease/liver cirrhosis-Hgb up to 10.4 from 6.9 after transfusion -Platelets up to 82 from 69 -StopPED Lovenox -Transfuse 1 units of PRBC -INR is down to 1.4 -No obvious bleeding noted  4)Acute renal failure superimposed on stage 3b chronic kidney disease renally adjust medications, avoid nephrotoxic agents / dehydration  / hypotension -Worsening renal function despite albumin infusion -Okay to start midodrine  5) hypothyroidism--- continue levothyroxine  6)GERD--- continue Protonix  7)DM2-A1c is 4.8 reflecting excellent diabetic control PTA -Patient is at risk for hypoglycemic episodes Use Novolog/Humalog Sliding scale insulin with Accu-Cheks/Fingersticks as ordered   8) adenocarcinoma of the  colon/concerns for hepatocellular carcinoma--outpatient follow-up with oncology advised  Disposition/Need for in-Hospital Stay- patient unable to be discharged at this time due to --decompensated liver cirrhosis with anemia and worsening thrombocytopenia requiring transfusions and albumin infusions -Anticipate possible discharge home in a couple days with home health PT  Status is: Inpatient   Disposition: The patient is from: Home              Anticipated d/c is to: Home with [facility] PT              Anticipated d/c date is: 2 days              Patient currently is not medically stable to d/c. Barriers: Not Clinically Stable-   Code Status :  -  Code Status: DNR   Family Communication:    (patient is alert, awake and coherent) Discussed with son david Leppla---[phone]  DVT Prophylaxis  :   - SCDs   SCDs Start: 09/03/21 1809   Lab Results  Component Value Date   PLT 82 (L) 09/07/2021    Inpatient Medications  Scheduled Meds:  (feeding supplement) PROSource Plus  30 mL Oral TID BM   calcium carbonate  1 tablet Oral BID WC   feeding supplement  237 mL Oral TID BM   levothyroxine  88 mcg Oral Daily   melatonin  6 mg Oral QHS   midodrine  5 mg Oral TID WC   multivitamin with minerals  1 tablet Oral Daily   octreotide  100 mcg Subcutaneous Q12H   ondansetron (ZOFRAN) IV  4 mg Intravenous Q6H   pantoprazole  40 mg Oral BID AC   saccharomyces boulardii  250 mg Oral BID  sodium bicarbonate  325 mg Oral BID   sucralfate  1 g Oral BID   vancomycin  125 mg Oral QID   Continuous Infusions:  sodium chloride Stopped (09/06/21 0832)   PRN Meds:.sodium chloride, acetaminophen **OR** acetaminophen, benzonatate, calcium carbonate, LORazepam, ondansetron **OR** ondansetron (ZOFRAN) IV, prochlorperazine   Anti-infectives (From admission, onward)    Start     Dose/Rate Route Frequency Ordered Stop   09/06/21 1800  vancomycin (VANCOCIN) capsule 125 mg        125 mg Oral 4 times daily  09/06/21 1631 10/03/2021 1759         Subjective: Cheryl Jordan today has no fevers, no emesis,  No chest pain,   - -3-4 mushy type stools -Appetite remains poor  Objective: Vitals:   09/06/21 1320 09/06/21 2204 09/07/21 0500 09/07/21 1422  BP: 130/67 119/61 (!) 115/59 122/63  Pulse: 91 70 68 73  Resp: 18 18 18 18   Temp: 98 F (36.7 C) 98.7 F (37.1 C) 98.5 F (36.9 C) 98.3 F (36.8 C)  TempSrc: Oral  Oral Oral  SpO2: 96% 96% 94% 95%  Weight:      Height:        Intake/Output Summary (Last 24 hours) at 09/07/2021 1757 Last data filed at 09/07/2021 0900 Gross per 24 hour  Intake 804.29 ml  Output --  Net 804.29 ml   Filed Weights   09/03/21 1143 09/03/21 1633  Weight: 84.4 kg 80.8 kg    Physical Exam  Gen:- Awake Alert, no acute distress HEENT:- .AT, No sclera icterus Neck-Supple Neck,No JVD,.  Lungs-  CTAB , fair symmetrical air movement CV- S1, S2 normal, regular  Abd-  +ve B.Sounds, Abd Soft, No tenderness, increased truncal adiposity/ascites Extremity/Skin:- pedal pulses present  Psych-affect is appropriate, oriented x3 Neuro-generalized weakness no new focal deficits, no tremors  Data Reviewed: I have personally reviewed following labs and imaging studies  CBC: Recent Labs  Lab 09/03/21 1217 09/04/21 0524 09/06/21 0445 09/07/21 0853  WBC 4.8 4.0 2.0* 3.6*  NEUTROABS 3.3  --  1.0* 2.1  HGB 10.6* 8.3* 6.9* 10.1*  HCT 33.9* 26.9* 23.3* 32.4*  MCV 92.4 91.8 92.5 92.8  PLT 98* 82* 69* 82*   Basic Metabolic Panel: Recent Labs  Lab 09/03/21 1217 09/04/21 0524 09/05/21 1558 09/06/21 0445 09/07/21 0853  NA 139 139 137 138 138  K 5.1 4.3 3.9 3.9 4.4  CL 112* 114* 112* 112* 109  CO2 18* 17* 19* 19* 20*  GLUCOSE 110* 84 174* 77 128*  BUN 48* 47* 43* 42* 43*  CREATININE 3.37* 3.10* 3.20* 2.93* 3.34*  CALCIUM 8.8* 8.4* 8.6* 9.2 9.7  MG 1.9  --   --   --   --    GFR: Estimated Creatinine Clearance: 14.9 mL/min (A) (by C-G formula based on SCr of  3.34 mg/dL (H)). Liver Function Tests: Recent Labs  Lab 09/03/21 1217 09/05/21 1558 09/06/21 0445 09/07/21 0853  AST 29 21 17 29   ALT 13 9 8 10   ALKPHOS 119 63 49 63  BILITOT 1.7* 1.0 1.2 2.1*  PROT 5.8* 5.1* 5.6* 6.3*  ALBUMIN 2.6* 3.6 4.2 4.5   Cardiac Enzymes: No results for input(s): CKTOTAL, CKMB, CKMBINDEX, TROPONINI in the last 168 hours. BNP (last 3 results) No results for input(s): PROBNP in the last 8760 hours. HbA1C: No results for input(s): HGBA1C in the last 72 hours. Sepsis Labs: @LABRCNTIP (procalcitonin:4,lacticidven:4) ) Recent Results (from the past 240 hour(s))  Culture, blood (routine x 2)  Status: None (Preliminary result)   Collection Time: 09/03/21 12:18 PM   Specimen: Left Antecubital; Blood  Result Value Ref Range Status   Specimen Description   Final    LEFT ANTECUBITAL BOTTLES DRAWN AEROBIC AND ANAEROBIC   Special Requests   Final    Blood Culture results may not be optimal due to an excessive volume of blood received in culture bottles   Culture   Final    NO GROWTH 4 DAYS Performed at [facility], [address] [place], [place] [address]    Report Status PENDING  Incomplete  Culture, blood (routine x 2)     Status: None (Preliminary result)   Collection Time: 09/03/21 12:40 PM   Specimen: Right Antecubital; Blood  Result Value Ref Range Status   Specimen Description   Final    RIGHT ANTECUBITAL BOTTLES DRAWN AEROBIC AND ANAEROBIC   Special Requests Blood Culture adequate volume  Final   Culture   Final    NO GROWTH 4 DAYS Performed at [facility], [address], [place], [place] [address]    Report Status PENDING  Incomplete  C Difficile Quick Screen w PCR reflex     Status: Abnormal   Collection Time: 09/04/21 10:40 AM   Specimen: STOOL  Result Value Ref Range Status   C Diff antigen POSITIVE (A) NEGATIVE Final   C Diff toxin NEGATIVE NEGATIVE Final   C Diff interpretation Results are indeterminate. See PCR results.  Final     Comment: Performed at [facility], [address] [place], [place] [address]  Gram stain     Status: None   Collection Time: 09/04/21 11:29 AM   Specimen: Peritoneal Washings  Result Value Ref Range Status   Specimen Description PERITONEAL  Final   Special Requests NONE  Final   Gram Stain   Final    WBC PRESENT,BOTH PMN AND MONONUCLEAR NO ORGANISMS SEEN CYTOSPIN SMEAR Performed at [facility], [address] [place], [place] [address]    Report Status 09/04/2021 FINAL  Final  Culture, body fluid w Gram Stain-bottle     Status: None (Preliminary result)   Collection Time: 09/04/21 11:30 AM   Specimen: Peritoneal Washings  Result Value Ref Range Status   Specimen Description PERITONEAL 10cc  Final   Special Requests   Final    BOTTLES DRAWN AEROBIC AND ANAEROBIC Blood Culture adequate volume   Culture   Final    NO GROWTH 3 DAYS Performed at [facility], [address] [place], [place] [address]    Report Status PENDING  Incomplete  C. Diff by PCR, Reflexed     Status: Abnormal   Collection Time: 09/05/21  3:09 PM  Result Value Ref Range Status   Toxigenic C. Difficile by PCR POSITIVE (A) NEGATIVE Final    Comment: Positive for toxigenic C. difficile with little to no toxin production. Only treat if clinical presentation suggests symptomatic illness. Performed at [facility], [place] [address] [place],  [address]     Radiology Studies: No results found.  Scheduled Meds:  (feeding supplement) PROSource Plus  30 mL Oral TID BM   calcium carbonate  1 tablet Oral BID WC   feeding supplement  237 mL Oral TID BM   levothyroxine  88 mcg Oral Daily   melatonin  6 mg Oral QHS   midodrine  5 mg Oral TID WC   multivitamin with minerals  1 tablet Oral Daily   octreotide  100 mcg Subcutaneous  Q12H   ondansetron (ZOFRAN) IV  4 mg Intravenous Q6H   pantoprazole  40 mg Oral BID AC   saccharomyces boulardii  250 mg Oral BID   sodium bicarbonate  325 mg Oral BID    sucralfate  1 g Oral BID   vancomycin  125 mg Oral QID   Continuous Infusions:  sodium chloride Stopped (09/06/21 0832)     LOS: 3 days   Roxan Hockey M.D on 09/07/2021 at 5:57 PM  Go to [email or web address] - for contact info  [facility] - Office  [phone]  If 7PM-7AM, please contact night-coverage [email or web address] Password [facility] 09/07/2021, 5:57 PM

## 2021-09-07 NOTE — TOC Benefit Eligibility Note (Addendum)
Patient Teacher, English as a foreign language completed.    The patient is currently admitted and upon discharge could be taking vancomycin 125 mg capsule.  The current 10 day co-pay is, $100.00.   The patient is insured through [place], [place] Patient Advocate Specialist [place] Patient Advocate Team Direct Number: [phone]  Fax: [phone]

## 2021-09-07 NOTE — Plan of Care (Signed)
  Problem: Education: Goal: Knowledge of General Education information will improve Description Including pain rating scale, medication(s)/side effects and non-pharmacologic comfort measures Outcome: Progressing   Problem: Health Behavior/Discharge Planning: Goal: Ability to manage health-related needs will improve Outcome: Progressing   

## 2021-09-07 NOTE — Telephone Encounter (Signed)
Home visit is scheduled with patient for 09/12/21. Patient is still not able to leave the home.

## 2021-09-07 NOTE — Progress Notes (Signed)
Gastroenterology Progress Note   Referring Provider: No ref. provider found Primary Care Physician:  Wannetta Sender, FNP Primary Gastroenterologist:  Dr.  Patient ID: Cheryl Jordan; [id number]; Feb 05, 1941    Subjective   She reported 3-4 loose BMs this morning. They were previously more mushy. She denies any noticeable blood or black tarry stools. She denies N/V. She feels as though her belly is about the same. Denies any pain. She does report that she feels like she has al little more energy than yesterday. Denies any confusion.    Objective   Vital signs in last 24 hours Temp:  [98.5 F (36.9 C)-98.7 F (37.1 C)] 98.5 F (36.9 C) (02/23 0500) Pulse Rate:  [68-70] 68 (02/23 0500) Resp:  [18] 18 (02/23 0500) BP: (115-119)/(59-61) 115/59 (02/23 0500) SpO2:  [94 %-96 %] 94 % (02/23 0500) Last BM Date : 09/07/21  Physical Exam General:  Alert and oriented, pleasant Head:  Normocephalic and atraumatic. Eyes:  No icterus, sclera clear. Conjuctiva pink.  Neck:  Supple, without thyromegaly or masses.  Heart:  S1, S2 present, no murmurs noted.  Lungs: Clear to auscultation bilaterally, without wheezing, rales, or rhonchi.  Abdomen:  Distended but soft, Bowel sounds present, non-distended. No HSM or hernias noted. No rebound or guarding. No masses appreciated  Msk:  Symmetrical without gross deformities. Normal posture. Pulses:  Normal pulses noted. Extremities:  Without clubbing or edema. Neurologic:  Alert and  oriented x4;  grossly normal neurologically. No asterixis.  Skin:  Warm and dry, intact without significant lesions.  Psych:  Alert and cooperative. Normal mood and affect.  Intake/Output from previous day: 02/22 0701 - 02/23 0700 In: 1073.7 [P.O.:480; I.V.:40.6; Blood:336; IV Piggyback:217.1] Out: 100 [Urine:100] Intake/Output this shift: Total I/O In: 240 [P.O.:240] Out: -   Lab Results  Recent Labs    09/06/21 0445 09/07/21 0853  WBC 2.0* 3.6*   HGB 6.9* 10.1*  HCT 23.3* 32.4*  PLT 69* 82*   BMET Recent Labs    09/05/21 1558 09/06/21 0445 09/07/21 0853  NA 137 138 138  K 3.9 3.9 4.4  CL 112* 112* 109  CO2 19* 19* 20*  GLUCOSE 174* 77 128*  BUN 43* 42* 43*  CREATININE 3.20* 2.93* 3.34*  CALCIUM 8.6* 9.2 9.7   LFT Recent Labs    09/05/21 1558 09/06/21 0445 09/07/21 0853  PROT 5.1* 5.6* 6.3*  ALBUMIN 3.6 4.2 4.5  AST 21 17 29   ALT 9 8 10   ALKPHOS 63 49 63  BILITOT 1.0 1.2 2.1*  BILIDIR  --  0.5*  --   IBILI  --  0.7  --    PT/INR Recent Labs    09/06/21 0445 09/07/21 0853  LABPROT 20.1* 17.0*  INR 1.7* 1.4*   Hepatitis Panel No results for input(s): HEPBSAG, HCVAB, HEPAIGM, HEPBIGM in the last 72 hours.  C-Diff PCR positive  Studies/Results CT ABDOMEN PELVIS WO CONTRAST  Result Date: 08/08/2021 CLINICAL DATA:  Nausea/vomiting Bowel obstruction suspected Acute on chronic kidney disease.  Liver cirrhosis.  Diarrhea. EXAM: CT ABDOMEN AND PELVIS WITHOUT CONTRAST TECHNIQUE: Multidetector CT imaging of the abdomen and pelvis was performed following the standard protocol without IV contrast. RADIATION DOSE REDUCTION: This exam was performed according to the departmental dose-optimization program which includes automated exposure control, adjustment of the mA and/or kV according to patient size and/or use of iterative reconstruction technique. COMPARISON:  Abdominal MRI 05/31/2021.  Abdominal CT 12/13/2020 FINDINGS: Lower chest: Chronic left pleural effusion, increased  from prior abdominal CT. There is a trace right pleural effusion, new. Associated linear and passive atelectasis in the lung bases. Hepatobiliary: Cirrhotic hepatic morphology. Low-density within segment 7 of the liver corresponds to ablation defect on prior MRI. This is not well assessed on this unenhanced exam. The 1.1 cm lesion on MRI is not well seen on the current exam. Pneumobilia with air in the common bile duct and central intrahepatic ducts.  Post cholecystectomy. Pancreas: Normal atrophy.  No ductal dilatation or inflammation. Spleen: Splenomegaly with spleen spanning 15.4 cm cranial caudal. Adrenals/Urinary Tract: No adrenal nodule. Bilateral renal parenchymal thinning and atrophy. No hydronephrosis. No renal calculi. Multiple bilateral renal cysts, including a hyperdense cyst in the mid upper left kidney, unchanged from prior exam. Multiple surgical clips adjacent to the left kidney unchanged. The urinary bladder is minimally distended. No bladder Mattson thickening. Stomach/Bowel: Equivocal Chmiel thickening about the gastric cardia and gastric body. There is no abnormal gastric distension. No bowel obstruction with administered enteric contrast in the small bowel and to the level of the sigmoid colon. No significant small bowel inflammation. Prior right hemicolectomy. The appendix is not visualized, presumably absent. Mild sigmoid colonic diverticulosis. No diverticulitis. No obvious colonic inflammation, although the presence of ascites partially obscures detailed assessment. Vascular/Lymphatic: Aortic atherosclerosis. No aortic aneurysm. Multiple small retroperitoneal lymph nodes, not enlarged by size criteria. No definite enlarged lymph nodes in the abdomen or pelvis. Reproductive: Normal quiescent appearance of the uterus. No obvious adnexal mass, ascites the pelvis partially obscures a voxel assessment. Other: Moderate volume abdominopelvic ascites. Mild generalized mesenteric edema with small amount of mesenteric ascites. No free air or loculated fluid collection. Musculoskeletal: The bones are diffusely under mineralized. Prominent Schmorl's node within superior endplate of J49 is unchanged. There are no acute or suspicious osseous abnormalities. IMPRESSION: 1. No bowel obstruction, enteric contrast reaches the colon. 2. Equivocal Musco thickening about the gastric cardia and gastric body, can be seen with gastritis or peptic ulcer disease. 3.  Cirrhosis with portal hypertension, splenomegaly, and moderate volume abdominopelvic ascites. Low-density in the right hepatic lobe corresponds to ablation defect on prior MRI. The additional 1 cm right lobe liver lesion on MRI is not well seen. Limited assessment for liver lesions on this unenhanced exam. 4. Chronic left pleural effusion, increased from prior abdominal CT. Trace right pleural effusion, new from prior. 5. Minimal sigmoid diverticulosis without diverticulitis. 6. Bilateral renal parenchymal thinning and atrophy with multiple renal cysts. No hydronephrosis. Aortic Atherosclerosis (ICD10-I70.0). Electronically Signed   By: Keith Rake M.D.   On: 08/08/2021 15:54   DG Chest 2 View  Result Date: 08/18/2021 CLINICAL DATA:  Weakness and loss of appetite. EXAM: CHEST - 2 VIEW COMPARISON:  earlier today FINDINGS: The heart size and mediastinal contours are within normal limits. The lung volumes are low. There is no pleural effusion or edema. No airspace opacities. The visualized skeletal structures are unremarkable. IMPRESSION: Low lung volumes.  No acute findings. Electronically Signed   By: Kerby Moors M.D.   On: 08/18/2021 18:55   MR ABDOMEN MRCP WO CONTRAST  Result Date: 08/11/2021 CLINICAL DATA:  Ascites, cirrhosis, hepatocellular carcinoma EXAM: MRI ABDOMEN WITHOUT CONTRAST  (INCLUDING MRCP) TECHNIQUE: Multiplanar multisequence MR imaging of the abdomen was performed. Heavily T2-weighted images of the biliary and pancreatic ducts were obtained, and three-dimensional MRCP images were rendered by post processing. COMPARISON:  CT abdomen and pelvis 08/08/2021, MRI abdomen 05/31/2021 FINDINGS: Study is limited due to lack of contrast, motion and large  volume ascites. Lower chest: Small right and moderate left pleural effusions. Cardiomegaly. Hepatobiliary: Liver is nodular with inhomogeneous parenchyma consistent with cirrhosis. 3.5 x 2.4 cm heterogeneous mass in the right hepatic lobe  consistent with previously ablated hepatocellular carcinoma which demonstrates peripheral hypointense T1, hyperintense T2 signal and central hyperintense T1, hypointense T2 signal. Mild surrounding hyperintense T2 signal hazy enhancement which extends towards the periphery/dome of the liver. No new hyperintense T2/DWI signal hepatic lesion identified. Gallbladder is surgically absent. No biliary ductal dilatation identified. Pancreas: Mildly atrophic with no obvious mass or ductal dilatation visualized. Spleen:  Enlarged measuring 16 cm in length. Adrenals/Urinary Tract: Adrenal glands appear grossly normal. Kidneys are mildly atrophic and lobulated left greater than right. Multiple hyperintense T2 signal likely cysts identified bilaterally, left greater than right. No hydronephrosis identified. Stomach/Bowel: No evidence of bowel obstruction. Centralization of bowel loops secondary to ascites. Vascular/Lymphatic: No bulky lymphadenopathy appreciated given limitations. No abdominal aortic aneurysm demonstrated. Other:  Large volume ascites. Musculoskeletal: No suspicious bony lesions identified. IMPRESSION: 1. Large volume ascites. 2. Hepatic cirrhosis. Previously ablated mass in the right hepatic lobe. No definite new hepatic mass visualized. 3. Splenomegaly consistent with portal hypertension. 4. Renal atrophy and renal cortical cysts. 5. Small right and moderate left pleural effusions.  Cardiomegaly. Electronically Signed   By: Ofilia Neas M.D.   On: 08/11/2021 12:05   MR 3D Recon At Scanner  Result Date: 08/11/2021 CLINICAL DATA:  Ascites, cirrhosis, hepatocellular carcinoma EXAM: MRI ABDOMEN WITHOUT CONTRAST  (INCLUDING MRCP) TECHNIQUE: Multiplanar multisequence MR imaging of the abdomen was performed. Heavily T2-weighted images of the biliary and pancreatic ducts were obtained, and three-dimensional MRCP images were rendered by post processing. COMPARISON:  CT abdomen and pelvis 08/08/2021, MRI  abdomen 05/31/2021 FINDINGS: Study is limited due to lack of contrast, motion and large volume ascites. Lower chest: Small right and moderate left pleural effusions. Cardiomegaly. Hepatobiliary: Liver is nodular with inhomogeneous parenchyma consistent with cirrhosis. 3.5 x 2.4 cm heterogeneous mass in the right hepatic lobe consistent with previously ablated hepatocellular carcinoma which demonstrates peripheral hypointense T1, hyperintense T2 signal and central hyperintense T1, hypointense T2 signal. Mild surrounding hyperintense T2 signal hazy enhancement which extends towards the periphery/dome of the liver. No new hyperintense T2/DWI signal hepatic lesion identified. Gallbladder is surgically absent. No biliary ductal dilatation identified. Pancreas: Mildly atrophic with no obvious mass or ductal dilatation visualized. Spleen:  Enlarged measuring 16 cm in length. Adrenals/Urinary Tract: Adrenal glands appear grossly normal. Kidneys are mildly atrophic and lobulated left greater than right. Multiple hyperintense T2 signal likely cysts identified bilaterally, left greater than right. No hydronephrosis identified. Stomach/Bowel: No evidence of bowel obstruction. Centralization of bowel loops secondary to ascites. Vascular/Lymphatic: No bulky lymphadenopathy appreciated given limitations. No abdominal aortic aneurysm demonstrated. Other:  Large volume ascites. Musculoskeletal: No suspicious bony lesions identified. IMPRESSION: 1. Large volume ascites. 2. Hepatic cirrhosis. Previously ablated mass in the right hepatic lobe. No definite new hepatic mass visualized. 3. Splenomegaly consistent with portal hypertension. 4. Renal atrophy and renal cortical cysts. 5. Small right and moderate left pleural effusions.  Cardiomegaly. Electronically Signed   By: Ofilia Neas M.D.   On: 08/11/2021 12:05   US Paracentesis  Result Date: 09/04/2021 INDICATION: Cirrhosis, ascites EXAM: ULTRASOUND GUIDED DIAGNOSTIC AND  THERAPEUTIC PARACENTESIS MEDICATIONS: None. COMPLICATIONS: None immediate. PROCEDURE: Informed written consent was obtained from the patient after a discussion of the risks, benefits and alternatives to treatment. A timeout was performed prior to the initiation of the procedure. Initial  ultrasound scanning demonstrates a large amount of ascites within the right lower abdominal quadrant. The right lower abdomen was prepped and draped in the usual sterile fashion. 1% lidocaine was used for local anesthesia. Following this, a 5 French catheter was introduced. An ultrasound image was saved for documentation purposes. The paracentesis was performed. The catheter was removed and a dressing was applied. The patient tolerated the procedure well without immediate post procedural complication. Patient received post-procedure intravenous albumin; see nursing notes for details. FINDINGS: A total of approximately 6 L of yellow ascitic fluid was removed. Samples were sent to the laboratory as requested by the clinical team. IMPRESSION: Successful ultrasound-guided paracentesis yielding 6 liters of peritoneal fluid. Electronically Signed   By: Lavonia Dana M.D.   On: 09/04/2021 13:24   US Paracentesis  Result Date: 08/21/2021 INDICATION: Patient with a history of cirrhosis, hepatocellular carcinoma and recurrent large volume ascites presents today for therapeutic paracentesis with 6 L max. EXAM: ULTRASOUND GUIDED PARACENTESIS MEDICATIONS: 1% lidocaine 10 mL COMPLICATIONS: None immediate. PROCEDURE: Informed written consent was obtained from the patient after a discussion of the risks, benefits and alternatives to treatment. A timeout was performed prior to the initiation of the procedure. Initial ultrasound scanning demonstrates a large amount of ascites within the left lower abdominal quadrant. The left lower abdomen was prepped and draped in the usual sterile fashion. 1% lidocaine was used for local anesthesia. Following this,  a 19 gauge, 7-cm, Yueh catheter was introduced. An ultrasound image was saved for documentation purposes. The paracentesis was performed. The catheter was removed and a dressing was applied. The patient tolerated the procedure well without immediate post procedural complication. Patient received post-procedure intravenous albumin; see nursing notes for details. FINDINGS: A total of approximately 6 L of clear yellow fluid was removed. IMPRESSION: Successful ultrasound-guided paracentesis yielding 6 liters of peritoneal fluid. Read by: Soyla Dryer, NP Electronically Signed   By: Jerilynn Mages.  Shick M.D.   On: 08/21/2021 15:26   US Paracentesis  Result Date: 08/11/2021 INDICATION: Patient with prior history of colon cancer; now with cirrhosis, hepatocellular carcinoma, renal insufficiency, recurrent ascites. Request received for diagnostic and therapeutic paracentesis up to 3 liters. EXAM: ULTRASOUND GUIDED DIAGNOSTIC AND THERAPEUTIC PARACENTESIS MEDICATIONS: 10 mL 1% lidocaine COMPLICATIONS: None immediate. PROCEDURE: Informed written consent was obtained from the patient after a discussion of the risks, benefits and alternatives to treatment. A timeout was performed prior to the initiation of the procedure. Initial ultrasound scanning demonstrates a large amount of ascites within the right lower abdominal quadrant. The right lower abdomen was prepped and draped in the usual sterile fashion. 1% lidocaine was used for local anesthesia. Following this, a 19 gauge, 10-cm, Yueh catheter was introduced. An ultrasound image was saved for documentation purposes. The paracentesis was performed. The catheter was removed and a dressing was applied. The patient tolerated the procedure well without immediate post procedural complication. FINDINGS: A total of approximately 3 liters of slightly hazy, yellow fluid was removed. Samples were sent to the laboratory as requested by the clinical team. IMPRESSION: Successful  ultrasound-guided diagnostic and therapeutic paracentesis yielding 3 liters of peritoneal fluid. Read by: Rowe Robert, PA-C Electronically Signed   By: Jerilynn Mages.  Shick M.D.   On: 08/11/2021 15:37   DG Chest Port 1 View  Result Date: 08/18/2021 CLINICAL DATA:  Generalized weakness EXAM: PORTABLE CHEST 1 VIEW COMPARISON:  03/02/2021 FINDINGS: Mild left basilar atelectasis. No focal consolidation. No pleural effusion or pneumothorax. Heart and mediastinal contours are unremarkable. No acute  osseous abnormality. IMPRESSION: No active disease. Electronically Signed   By: Kathreen Devoid M.D.   On: 08/18/2021 18:34   ECHOCARDIOGRAM COMPLETE  Result Date: 08/11/2021    ECHOCARDIOGRAM REPORT   Patient Name:   Cheryl Jordan Date of Exam: 08/11/2021 Medical Rec #:  [id number]   Height:       67.0 in Accession #:    [id number]  Weight:       186.1 lb Date of Birth:  [birth year]    BSA:          1.961 m Patient Age:    81 years    BP:           96/68 mmHg Patient Gender: F           HR:           88 bpm. Exam Location:  Inpatient Procedure: 2D Echo, Cardiac Doppler and Color Doppler Indications:    murmur  History:        Patient has prior history of Echocardiogram examinations, most                 recent 04/08/2020. Signs/Symptoms:Dyspnea and Murmur; Risk                 Factors:Diabetes and Hypertension. AS / GERD.  Sonographer:    Beryle Beams Referring Phys: [id number] [place]  Sonographer Comments: No subcostal window. IMPRESSIONS  1. Left ventricular ejection fraction, by estimation, is 65 to 70%. The left ventricle has normal function. The left ventricle has no regional Langelier motion abnormalities. Left ventricular diastolic parameters are consistent with Grade I diastolic dysfunction (impaired relaxation).  2. Right ventricular systolic function is normal. The right ventricular size is normal. Tricuspid regurgitation signal is inadequate for assessing PA pressure.  3. There is a trivial pericardial effusion at the apex.  4.  The mitral valve is normal in structure. No evidence of mitral valve regurgitation. No evidence of mitral stenosis.  5. The aortic valve is calcified. Aortic valve regurgitation is not visualized. Mild aortic valve stenosis. Aortic valve area, by VTI measures 1.36 cm. Aortic valve mean gradient measures 17.0 mmHg. Aortic valve Vmax measures 2.80 m/s. FINDINGS  Left Ventricle: Left ventricular ejection fraction, by estimation, is 65 to 70%. The left ventricle has normal function. The left ventricle has no regional Blank motion abnormalities. The left ventricular internal cavity size was normal in size. There is  no left ventricular hypertrophy. Left ventricular diastolic parameters are consistent with Grade I diastolic dysfunction (impaired relaxation). Normal left ventricular filling pressure. Right Ventricle: The right ventricular size is normal. No increase in right ventricular Biggins thickness. Right ventricular systolic function is normal. Tricuspid regurgitation signal is inadequate for assessing PA pressure. Left Atrium: Left atrial size was normal in size. Right Atrium: Right atrial size was normal in size. Pericardium: Trivial pericardial effusion is present. The pericardial effusion is surrounding the apex. Mitral Valve: The mitral valve is normal in structure. No evidence of mitral valve regurgitation. No evidence of mitral valve stenosis. Tricuspid Valve: The tricuspid valve is normal in structure. Tricuspid valve regurgitation is not demonstrated. No evidence of tricuspid stenosis. Aortic Valve: The aortic valve is calcified. Aortic valve regurgitation is not visualized. Mild aortic stenosis is present. Aortic valve mean gradient measures 17.0 mmHg. Aortic valve peak gradient measures 31.4 mmHg. Aortic valve area, by VTI measures 1.36 cm. Pulmonic Valve: The pulmonic valve was normal in structure. Pulmonic valve regurgitation is not visualized. No  evidence of pulmonic stenosis. Aorta: The aortic root is  normal in size and structure. Venous: The inferior vena cava was not well visualized. IAS/Shunts: No atrial level shunt detected by color flow Doppler.  LEFT VENTRICLE PLAX 2D LVIDd:         3.00 cm     Diastology LVIDs:         1.90 cm     LV e' medial:    5.11 cm/s LV PW:         1.00 cm     LV E/e' medial:  14.4 LV IVS:        1.10 cm     LV e' lateral:   7.18 cm/s LVOT diam:     1.50 cm     LV E/e' lateral: 10.3 LV SV:         74 LV SV Index:   38 LVOT Area:     1.77 cm  LV Volumes (MOD) LV vol d, MOD A2C: 52.7 ml LV vol d, MOD A4C: 60.3 ml LV vol s, MOD A2C: 21.8 ml LV vol s, MOD A4C: 23.1 ml LV SV MOD A2C:     30.9 ml LV SV MOD A4C:     60.3 ml LV SV MOD BP:      34.1 ml RIGHT VENTRICLE RV Basal diam:  3.60 cm RV Mid diam:    3.40 cm RV S prime:     17.30 cm/s TAPSE (M-mode): 2.0 cm LEFT ATRIUM             Index        RIGHT ATRIUM           Index LA diam:        2.70 cm 1.38 cm/m   RA Area:     12.60 cm LA Vol (A2C):   26.2 ml 13.36 ml/m  RA Volume:   31.20 ml  15.91 ml/m LA Vol (A4C):   52.2 ml 26.62 ml/m LA Biplane Vol: 37.7 ml 19.22 ml/m  AORTIC VALVE                     PULMONIC VALVE AV Area (Vmax):    1.16 cm      PV Vmax:       1.10 m/s AV Area (Vmean):   1.14 cm      PV Vmean:      67.900 cm/s AV Area (VTI):     1.36 cm      PV VTI:        0.197 m AV Vmax:           280.00 cm/s   PV Peak grad:  4.8 mmHg AV Vmean:          193.000 cm/s  PV Mean grad:  2.0 mmHg AV VTI:            0.543 m AV Peak Grad:      31.4 mmHg AV Mean Grad:      17.0 mmHg LVOT Vmax:         184.00 cm/s LVOT Vmean:        125.000 cm/s LVOT VTI:          0.418 m LVOT/AV VTI ratio: 0.77  AORTA Ao Root diam: 3.00 cm Ao Asc diam:  3.00 cm MITRAL VALVE MV Area (PHT): 2.56 cm     SHUNTS MV Decel Time: 296 msec     Systemic VTI:  0.42 m MV E velocity:  73.60 cm/s   Systemic Diam: 1.50 cm MV A velocity: 124.00 cm/s MV E/A ratio:  0.59 Fransico Him MD Electronically signed by Fransico Him MD Signature Date/Time: 08/11/2021/3:11:23  PM    Final    US LIVER DOPPLER  Result Date: 08/19/2021 CLINICAL DATA:  Cirrhosis EXAM: DUPLEX ULTRASOUND OF LIVER TECHNIQUE: Color and duplex Doppler ultrasound was performed to evaluate the hepatic in-flow and out-flow vessels. COMPARISON:  MRI abdomen 08/11/2021 FINDINGS: Liver: Cirrhotic liver morphology. 2.9 cm echogenic mass noted in the right liver lobe corresponds to the ablated lesion seen on prior MRI. Main Portal Vein size: 1.5 cm Portal Vein Velocities Main Prox:  15 cm/sec Main Mid: 10 cm/sec Main Dist:  16 cm/sec Right: 9 cm/sec Left: 13 cm/sec Hepatic Vein Velocities Right:  15 cm/sec Middle:  28 cm/sec Left:  19 cm/sec IVC: Present and patent with normal respiratory phasicity. Hepatic Artery Velocity:  72 cm/sec Splenic Vein Velocity:  20 cm/sec Spleen: 13 cm x 9 cm x 12 cm with a total volume of 755 cm^3 (411 cm^3 is upper limit normal) Portal Vein Occlusion/Thrombus: No Splenic Vein Occlusion/Thrombus: No Ascites: Mild abdominal ascites. Varices: None Incidental note made of left renal cyst measuring 6.6 cm and the left pleural effusion. IMPRESSION: 1. Patent portal vein with appropriate direction of flow. 2. Splenomegaly 3. Mild ascites 4. Cirrhotic liver morphology Electronically Signed   By: Miachel Roux M.D.   On: 08/19/2021 08:18   IR Radiologist Eval & Mgmt  Result Date: 08/29/2021 Please refer to notes tab for details about interventional procedure. (Op Note)   Assessment  81 y.o. 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  She presents this admission with progressive weakness, worsening abdominal distention for which she has been getting weekly paracentesis, and loose stools.  GI consulted due to concern for development of  hepatorenal syndrome and Nash cirrhosis.  NASH Cirrhosis: MELD-Na: 25; Child Pugh: Class B.  She has failed diuretic therapy for her ascites, has had multiple paracentesis, almost weekly.  Her last paracentesis was 09/04/2021, 6L was removed and negative for SBP.  Today her INR is 1.4, plts has improved to 82 from 69.  It was discussed with patient during her last hospital hospitalization earlier this month about doing a TIPS procedure however given her current health status and MELD score she would not be a candidate for TIPS at this time.  She was given albumin infusion and transition to 50 g IV 3 times daily which did not improve her renal function and she continues to have ascites.  She was started on octreotide and midodrine yesterday.  Palliative care consult completed yesterday and patient stated that her goal would be to try to improve in order to be able to have the TIPS procedure, she does want to remain DNR.   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 Given albumin infusion and continued on 50g IV TID with no improvement in kidney function. Started on midodrine.   C-Difficile: Antigen negative, toxin negative, PCR positive.  Was previously not having diarrhea, only mushy stools.  Beginning today she has had multiple loose bowel movements.  We will continue to treat empirically with oral vancomycin.  Anemia: Hgb dropped to 6.9  from 8.3 yesterday. Remains normocytic (MCV 92). She received 1u PRBC with Hgb improved to 10.1 today. Stool occult positive however patient does report hemorrhoids, which is likely source of positive result.  The patient was only started on octreotide and midodrine yesterday with no significant improvement in creatinine today.  Although she has had improvement in her hemoglobin with her ongoing progressively worsening kidney function would recommend to remain in the hospital to be monitored for at least  1 more day.    Plan / Recommendations  Continue oral vancomycin for total 10 days. Continue octreotide and midodrine Continue supportive measures and ongoing goals of care discussions. Continue zofran PRN for nausea    LOS: 3 days    09/07/2021, 1:48 PM   Venetia Night, MSN, FNP-BC, AGACNP-BC [facility]

## 2021-09-07 NOTE — Progress Notes (Signed)
Physical Therapy Treatment Patient Details Name: ESPARANZA KRIDER MRN: [id number] DOB: 07-25-1940 Today's Date: 09/07/2021   History of Present Illness ASHARIA LOTTER is a 81 y.o. female with medical history significant of liver cirrhosis due to NASH, colon cancer, CKD stage 3b, T2DM, severe protein calorie malnutrition, who had a recent hospitalization 08/18/21 to 08/24/21 for SARS COVID 19 infection and decompensated liver cirrhosis. She was discharged home with home health services.      At home patient continue with progressive weakness, to the point she was not able to further ambulate independently. Very poor oral intake and worsening abdominal distention. Over last 2 to 3 days has been experiencing diarrhea that has been refractive to imodium.      Because of worsening weakness she was brought to the hospital for further evaluation. Denies any abdominal pain, nausea or vomiting, no dyspnea or chest pain.    PT Comments    Patient lying in bed on PT arrival.  She is pleasant and agreeable to physical therapy and states she needs to go to the bathroom.  PT assists patient to the bathroom. She is having some diarrhea today.  Patient ambulates to the bathroom with RW and CGA; occassional min A for steering.  Patient needs min A and grab bars to stand from the toilet. Patient ambulates x 30 ft total with CGA/min A.  Patient able to sit up in chair; needs Vcs for hand placement to control descent to sitting.  PT instructed patient in seated LE there ex to include heel/toe raises, LAQs, hip Flex x 10 reps each.  Patient in chair with nursing present at the end of treatment, call bell close by. Patient will benefit from continued skilled physical therapy in hospital and recommended venue below to increase strength, balance, endurance for safe ADLs and gait.     Recommendations for follow up therapy are one component of a multi-disciplinary discharge planning process, led by the attending physician.   Recommendations may be updated based on patient status, additional functional criteria and insurance authorization.  Follow Up Recommendations  Home health PT     Assistance Recommended at Discharge Intermittent Supervision/Assistance  Patient can return home with the following Help with stairs or ramp for entrance;A little help with bathing/dressing/bathroom;Assistance with cooking/housework;A little help with walking and/or transfers   Equipment Recommendations  None recommended by PT    Recommendations for Other Services       Precautions / Restrictions Precautions Precautions: Fall Restrictions Weight Bearing Restrictions: No     Mobility  Bed Mobility Overal bed mobility: Modified Independent             General bed mobility comments: HOH elevated for supine to sit; sit to supine with bed flat.    Transfers Overall transfer level: Needs assistance Equipment used: Rolling walker (2 wheels) Transfers: Sit to/from Stand, Bed to chair/wheelchair/BSC Sit to Stand: Min guard, Min assist           General transfer comment: increased time; needed more A for sit to stand from toilet than with sit to stand from EOB    Ambulation/Gait Ambulation/Gait assistance: Min guard, Min assist Gait Distance (Feet): 30 Feet Assistive device: Rolling walker (2 wheels) Gait Pattern/deviations: Decreased step length - right, Decreased step length - left, Decreased stride length Gait velocity: decreased     General Gait Details: Patient has to go to the bathroom so PT assists her with ambulation to the bathroom and back with RW and CGA/occassional min  A for steering.  slow gait speed but no LOB noted   Stairs             Wheelchair Mobility    Modified Rankin (Stroke Patients Only)       Balance Overall balance assessment: Needs assistance Sitting-balance support: Bilateral upper extremity supported, Feet supported Sitting balance-Leahy Scale: Good Sitting  balance - Comments: seated EOB with good balance   Standing balance support: During functional activity, Bilateral upper extremity supported Standing balance-Leahy Scale: Good Standing balance comment: good balance with RW and CGA from PT                            Cognition Arousal/Alertness: Awake/alert Behavior During Therapy: WFL for tasks assessed/performed Overall Cognitive Status: Within Functional Limits for tasks assessed                                          Exercises Other Exercises Other Exercises: PT instructed patient in seated LE ther ex to include heel/toe raies, LAQs, hip F all 10 reps each.  PT encouraged patient to continue with her exercises 2-3 times daily and she vu    General Comments General comments (skin integrity, edema, etc.): Patient with diarrhea today.  PT assisted patient with cleaning up from toileting and a gown change      Pertinent Vitals/Pain Pain Assessment Pain Assessment: No/denies pain    Home Living                          Prior Function            PT Goals (current goals can now be found in the care plan section)      Frequency           PT Plan Current plan remains appropriate    Co-evaluation              AM-PAC PT "6 Clicks" Mobility   Outcome Measure  Help needed turning from your back to your side while in a flat bed without using bedrails?: A Little Help needed moving from lying on your back to sitting on the side of a flat bed without using bedrails?: A Little Help needed moving to and from a bed to a chair (including a wheelchair)?: A Little Help needed standing up from a chair using your arms (e.g., wheelchair or bedside chair)?: A Little Help needed to walk in hospital room?: A Little Help needed climbing 3-5 steps with a railing? : A Lot 6 Click Score: 17    End of Session   Activity Tolerance: Patient tolerated treatment well;Patient limited by  fatigue Patient left: with call bell/phone within reach;in chair;with nursing/sitter in room Nurse Communication: Mobility status PT Visit Diagnosis: Muscle weakness (generalized) (M62.81);Unsteadiness on feet (R26.81);Other abnormalities of gait and mobility (R26.89)     Time: 0938-1829 PT Time Calculation (min) (ACUTE ONLY): 26 min  Charges:  $Gait Training: 8-22 mins $Therapeutic Exercise: 8-22 mins                     10:50 AM, 09/07/21 Cellie Dardis Small Jahziel [facility] physical therapy [place] [phone] IR:[phone]

## 2021-09-08 ENCOUNTER — Inpatient Hospital Stay (HOSPITAL_COMMUNITY): Payer: Medicare Other

## 2021-09-08 DIAGNOSIS — Z7189 Other specified counseling: Secondary | ICD-10-CM

## 2021-09-08 DIAGNOSIS — Z515 Encounter for palliative care: Secondary | ICD-10-CM

## 2021-09-08 LAB — CBC
HCT: 29.8 % — ABNORMAL LOW (ref 36.0–46.0)
Hemoglobin: 9.2 g/dL — ABNORMAL LOW (ref 12.0–15.0)
MCH: 28.9 pg (ref 26.0–34.0)
MCHC: 30.9 g/dL (ref 30.0–36.0)
MCV: 93.7 fL (ref 80.0–100.0)
Platelets: 76 10*3/uL — ABNORMAL LOW (ref 150–400)
RBC: 3.18 MIL/uL — ABNORMAL LOW (ref 3.87–5.11)
RDW: 16.1 % — ABNORMAL HIGH (ref 11.5–15.5)
WBC: 4.1 10*3/uL (ref 4.0–10.5)
nRBC: 0 % (ref 0.0–0.2)

## 2021-09-08 LAB — COMPREHENSIVE METABOLIC PANEL
ALT: 8 U/L (ref 0–44)
AST: 22 U/L (ref 15–41)
Albumin: 3.6 g/dL (ref 3.5–5.0)
Alkaline Phosphatase: 59 U/L (ref 38–126)
Anion gap: 7 (ref 5–15)
BUN: 47 mg/dL — ABNORMAL HIGH (ref 8–23)
CO2: 19 mmol/L — ABNORMAL LOW (ref 22–32)
Calcium: 9.1 mg/dL (ref 8.9–10.3)
Chloride: 112 mmol/L — ABNORMAL HIGH (ref 98–111)
Creatinine, Ser: 3.47 mg/dL — ABNORMAL HIGH (ref 0.44–1.00)
GFR, Estimated: 13 mL/min — ABNORMAL LOW (ref >60)
Glucose, Bld: 113 mg/dL — ABNORMAL HIGH (ref 70–99)
Potassium: 4.5 mmol/L (ref 3.5–5.1)
Sodium: 138 mmol/L (ref 135–145)
Total Bilirubin: 1.4 mg/dL — ABNORMAL HIGH (ref 0.3–1.2)
Total Protein: 5.3 g/dL — ABNORMAL LOW (ref 6.5–8.1)

## 2021-09-08 LAB — CULTURE, BLOOD (ROUTINE X 2)
Culture: NO GROWTH
Culture: NO GROWTH
Special Requests: ADEQUATE

## 2021-09-08 LAB — PROTIME-INR
INR: 1.4 — ABNORMAL HIGH (ref 0.8–1.2)
Prothrombin Time: 17.2 seconds — ABNORMAL HIGH (ref 11.4–15.2)

## 2021-09-08 MED ORDER — VANCOMYCIN HCL 125 MG PO CAPS: 125.0000 mg | ORAL_CAPSULE | Freq: Four times a day (QID) | ORAL | 0 refills | Status: AC

## 2021-09-08 MED ORDER — SACCHAROMYCES BOULARDII 250 MG PO CAPS: 250.0000 mg | ORAL_CAPSULE | Freq: Two times a day (BID) | ORAL | 0 refills | Status: AC

## 2021-09-08 MED ORDER — SUCRALFATE 1 G PO TABS: 1.0000 g | ORAL_TABLET | Freq: Two times a day (BID) | ORAL | 0 refills | Status: AC

## 2021-09-08 MED ORDER — SODIUM BICARBONATE 650 MG PO TABS: 650.0000 mg | ORAL_TABLET | Freq: Two times a day (BID) | ORAL | 3 refills | Status: AC

## 2021-09-08 MED ORDER — ADULT MULTIVITAMIN W/MINERALS CH: 1.0000 | ORAL_TABLET | Freq: Every day | ORAL | 2 refills | Status: AC

## 2021-09-08 MED ORDER — PROPRANOLOL HCL 20 MG PO TABS: 20.0000 mg | ORAL_TABLET | Freq: Two times a day (BID) | ORAL | 3 refills | Status: AC

## 2021-09-08 MED ORDER — SODIUM BICARBONATE 650 MG PO TABS
650.0000 mg | ORAL_TABLET | Freq: Two times a day (BID) | ORAL | Status: DC
  Administered 2021-09-08 – 2021-09-09 (×3): 650 mg via ORAL
  Filled 2021-09-08 (×3): qty 1

## 2021-09-08 MED ORDER — PANTOPRAZOLE SODIUM 40 MG PO TBEC
40.0000 mg | DELAYED_RELEASE_TABLET | Freq: Two times a day (BID) | ORAL | 3 refills | Status: AC
Start: 2021-09-08 — End: ?

## 2021-09-08 MED ORDER — MIDODRINE HCL 5 MG PO TABS
5.0000 mg | ORAL_TABLET | Freq: Three times a day (TID) | ORAL | 3 refills | Status: AC
Start: 2021-09-09 — End: ?

## 2021-09-08 NOTE — Progress Notes (Addendum)
Daily Progress Note   Patient Name: RAINA Jordan       Date: 09/08/2021 DOB: 10-12-1940  Age: 81 y.o. MRN#: [id number] Attending Physician: Roxan Hockey, MD Primary Care Physician: Wannetta Sender, FNP Admit Date: 09/03/2021 Length of Stay: 4 days  Reason for Consultation/Follow-up: Establishing goals of care  HPI/Patient Profile:  81 y.o. female  with past medical history of liver cirrhosis due to NASH, colon cancer, CKD stage 3b, T2DM, severe protein calorie malnutrition, who had a recent hospitalization 08/18/21 to 08/24/21 for SARS COVID 19 infection and decompensated liver cirrhosis. She presented for progressive weakness, poor oral intake, abdominal distension, and diarrhea. She was admitted on 09/03/2021 with weakness, anemia of chronic disease, severe malnutrition, diarrhea (? CDiff), decompensated cirrhosis, AKI on CKD (? HRS), and others. Patient deemed not a candidate for TIPS at this time due to MELD score. Nephrology on board.   PMT was consulted for [place] discussions.  Subjective:   Subjective: Chart Reviewed. Updates received. Patient Assessed. Created space and opportunity for patient  and family to explore thoughts and feelings regarding current medical situation.  Today's Discussion: Met with the patient and her granddaughter at the bedside for an extensive discussion. Provided a lot of education about liver disease including pathophysiology, etiology of symptoms in liver disease, disease trajectory, etc. She described nausea and vomiting this morning that has since improved.  She tells me that she is still not decided on whether or not she would want to have the TIPS procedure.  We again discussed the risks versus benefits including risk of recurrent hepatic encephalopathy.  She states this concerned her.  I recommended that when she is discharged she follow-up with her primary GI doctor and have further discussions about TIPS procedure based on her MELD score after  discharge to see if this is even an option.  If so, then she would need to have a good discussion with her primary gastroenterologist and her family to decide whether or not she would want to pursue.  I recommended outpatient palliative care to continue to follow with goals of care conversations as her disease process evolves and progresses.  She agrees to this and I will put in a consult.  I provided emotional and general support through therapeutic listening, therapeutic silence, empathy, sharing of stories, and other techniques.  I answered all questions and addressed all concerns to the best of my ability.  Review of Systems  Gastrointestinal:  Negative for abdominal pain, nausea (improved from this morning) and vomiting.   Objective:   Vital Signs:  BP (!) 115/51 (BP Location: Left Arm)    Pulse 67    Temp 97.9 F (36.6 C) (Oral)    Resp 18    Ht 5' 7"  (1.702 m)    Wt 80.8 kg    SpO2 97%    BMI 27.91 kg/m   Physical Exam: Physical Exam Vitals and nursing note reviewed.  Constitutional:      General: She is not in acute distress.    Appearance: She is ill-appearing. She is not toxic-appearing.  HENT:     Head: Normocephalic and atraumatic.  Cardiovascular:     Rate and Rhythm: Normal rate.  Pulmonary:     Effort: Pulmonary effort is normal. No respiratory distress.  Abdominal:     Palpations: Abdomen is soft.  Skin:    General: Skin is warm and dry.  Neurological:     General: No focal deficit present.  Psychiatric:  Mood and Affect: Mood normal.        Behavior: Behavior normal.    Palliative Assessment/Data: 40%   Assessment & Plan:   Impression: Present on Admission:  Hypothyroidism  GERD (gastroesophageal reflux disease)  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HCC)  Protein-calorie malnutrition, severe  Decompensated hepatic cirrhosis (HCC)  Adenocarcinoma of colon (HCC)  Diarrhea  Anemia of chronic disease  81 year old female with progressive liver  disease now with decompensated cirrhosis, AKI concerning for hepatorenal syndrome, severe malnutrition.  Previously had hepatocellular carcinoma status post ablation of lesions.  She understands her health is getting worse and her kidneys took a hit and are not getting better.  We discussed that there are limited treatment options.  She is undecided whether she would want TIPS procedure, although she understands at this point she is not a candidate.  Appreciate nephrology input and they indicates she may be a TIPS procedure depending on how her kidneys do as an outpatient.  Outside of TIPS procedure (which may not be possible) there are limited treatment options.  We discussed need for ongoing goals of care discussions as she reached a decision points in as her clinical picture evolves.  Recommended and she excepted outpatient palliative care consult.  Overall prognosis remains quite poor.  SUMMARY OF RECOMMENDATIONS   Continue DNR Treat the treatable TOC referral for outpatient palliative care Ongoing goals of care discussions as her disease process evolves  Symptom Management:  Per primary team PMT is available to assist as needed  Code Status: DNR  Prognosis: Unable to determine  Discharge Planning: To Be Determined  Discussed with: Patient, patient's family, medical team, nursing team, [facility] team  Thank you for allowing [place] to participate in the care of KORYN CHARLOT PMT will continue to support holistically.  Time Total: 50 min  Visit consisted of counseling and education dealing with the complex and emotionally intense issues of symptom management and palliative care in the setting of serious and potentially life-threatening illness. Greater than 50%  of this time was spent counseling and coordinating care related to the above assessment and plan.  Walden Field, NP Palliative Medicine Team  Team Phone # [phone] (Nights/Weekends)  03/14/2021, 8:17 AM

## 2021-09-08 NOTE — Progress Notes (Signed)
Occupational Therapy Treatment Patient Details Name: Cheryl Jordan MRN: [id number] DOB: 1941-05-11 Today's Date: 09/08/2021   History of present illness Cheryl Jordan is a 81 y.o. female with medical history significant of liver cirrhosis due to NASH, colon cancer, CKD stage 3b, T2DM, severe protein calorie malnutrition, who had a recent hospitalization 08/18/21 to 08/24/21 for SARS COVID 19 infection and decompensated liver cirrhosis. She was discharged home with home health services.      At home patient continue with progressive weakness, to the point she was not able to further ambulate independently. Very poor oral intake and worsening abdominal distention. Over last 2 to 3 days has been experiencing diarrhea that has been refractive to imodium.      Because of worsening weakness she was brought to the hospital for further evaluation. Denies any abdominal pain, nausea or vomiting, no dyspnea or chest pain.   OT comments  Pt agreeable to OT treatment this date. Pt operating at more supervision level of assist in standing and ambulating with RW for toilet transfer. Min assist was needed to boost from toilet using G bar and RW. Pt demonstrates much improved endurance from OT evaluation with good ability to tolerate B UE strengthening seated in chiar as well as several minutes of standing while brushing teeth at the sink. Pt will benefit from continued B UE strengthening and endurance training via [facility] OT services. Pt will benefit from continued OT in the hospital and recommended venue below to increase strength, balance, and endurance for safe ADL's.      Recommendations for follow up therapy are one component of a multi-disciplinary discharge planning process, led by the attending physician.  Recommendations may be updated based on patient status, additional functional criteria and insurance authorization.    Follow Up Recommendations  Home health OT    Assistance Recommended at Discharge Intermittent  Supervision/Assistance  Patient can return home with the following  A little help with walking and/or transfers;A little help with bathing/dressing/bathroom;Assistance with cooking/housework;Assist for transportation;Help with stairs or ramp for entrance   Equipment Recommendations  None recommended by OT    Recommendations for Other Services      Precautions / Restrictions Precautions Precautions: Fall Restrictions Weight Bearing Restrictions: No       Mobility                    Transfers Overall transfer level: Needs assistance Equipment used: Rolling walker (2 wheels) Transfers: Sit to/from Stand, Bed to chair/wheelchair/BSC Sit to Stand: Supervision, Min guard     Step pivot transfers: Supervision, Min guard     General transfer comment: Pt demosntrates improved mobility using RW with more supervision assist unless standing from toilet which pt needed min A with.     Balance Overall balance assessment: Needs assistance Sitting-balance support: Feet supported, No upper extremity supported Sitting balance-Leahy Scale: Good Sitting balance - Comments: seated in chair   Standing balance support: During functional activity, Bilateral upper extremity supported Standing balance-Leahy Scale: Good Standing balance comment: fair to good balance using RW and more supervision assist from this OT                           ADL either performed or assessed with clinical judgement   ADL Overall ADL's : Needs assistance/impaired     Grooming: Supervision/safety;Wash/dry hands;Oral care;Standing Grooming Details (indicate cue type and reason): Pt completing grooming while standing at the sink with RW  available.             Lower Body Dressing: Independent;Sitting/lateral leans Lower Body Dressing Details (indicate cue type and reason): Pt donning and doffing socks without difficulty seated in chair. Toilet Transfer: Minimal assistance;Ambulation;Rolling  walker (2 wheels) Toilet Transfer Details (indicate cue type and reason): Pt able to ambulate to and from toilet well but needs min A to boost from toilet using grab bar and RW.         Functional mobility during ADLs: Supervision/safety;Min guard;Rolling walker (2 wheels)        Cognition Arousal/Alertness: Awake/alert Behavior During Therapy: WFL for tasks assessed/performed Overall Cognitive Status: Within Functional Limits for tasks assessed                                          Exercises Exercises: General Upper Extremity General Exercises - Upper Extremity Shoulder Flexion: AROM, Both, 10 reps, Seated Shoulder ABduction: AROM, 10 reps, Both, Seated Shoulder ADduction: AROM, 10 reps, Seated, Both Shoulder Horizontal ABduction: AROM, 10 reps, Both, Seated (x10 protraction seated as well.)      Frequency  Min 1X/week        Progress Toward Goals  OT Goals(current goals can now be found in the care plan section)  Progress towards OT goals: Progressing toward goals  Acute Rehab OT Goals Patient Stated Goal: return home OT Goal Formulation: With patient Time For Goal Achievement: 09/20/21 Potential to Achieve Goals: Good ADL Goals Pt Will Perform Grooming: with modified independence;standing Pt Will Perform Lower Body Dressing: Independently Pt Will Transfer to Toilet: with modified independence;ambulating Pt Will Perform Toileting - Clothing Manipulation and hygiene: with modified independence;sitting/lateral leans Pt/caregiver will Perform Home Exercise Program: Increased strength;Both right and left upper extremity;Independently  Plan Discharge plan remains appropriate                                    End of Session Equipment Utilized During Treatment: Rolling walker (2 wheels)  OT Visit Diagnosis: Unsteadiness on feet (R26.81);Other abnormalities of gait and mobility (R26.89);Muscle weakness (generalized) (M62.81);Adult,  failure to thrive (R62.7)   Activity Tolerance Patient tolerated treatment well   Patient Left in chair;with call bell/phone within reach   Nurse Communication          Time: 0981-1914 OT Time Calculation (min): 19 min  Charges: OT General Charges $OT Visit: 1 Visit OT Treatments $Self Care/Home Management : 8-22 mins  [facility] OT, MOT  Larey Seat 09/08/2021, 9:51 AM

## 2021-09-08 NOTE — Progress Notes (Signed)
Physical Therapy Treatment Patient Details Name: Cheryl Jordan MRN: [id number] DOB: Jan 17, 1941 Today's Date: 09/08/2021   History of Present Illness Cheryl Jordan is a 81 y.o. female with medical history significant of liver cirrhosis due to NASH, colon cancer, CKD stage 3b, T2DM, severe protein calorie malnutrition, who had a recent hospitalization 08/18/21 to 08/24/21 for SARS COVID 19 infection and decompensated liver cirrhosis. She was discharged home with home health services.      At home patient continue with progressive weakness, to the point she was not able to further ambulate independently. Very poor oral intake and worsening abdominal distention. Over last 2 to 3 days has been experiencing diarrhea that has been refractive to imodium.      Because of worsening weakness she was brought to the hospital for further evaluation. Denies any abdominal pain, nausea or vomiting, no dyspnea or chest pain.    PT Comments    Patient seated in chair at beginning of session. Tolerates seated exercises well and completes further mobility with guard assist. Transfers and ambulates with slow, labored movements and use of RW without loss of balance. She ends session seated in chair. Patient will benefit from continued skilled physical therapy in hospital and recommended venue below to increase strength, balance, endurance for safe ADLs and gait.    Recommendations for follow up therapy are one component of a multi-disciplinary discharge planning process, led by the attending physician.  Recommendations may be updated based on patient status, additional functional criteria and insurance authorization.  Follow Up Recommendations  Home health PT     Assistance Recommended at Discharge Intermittent Supervision/Assistance  Patient can return home with the following Help with stairs or ramp for entrance;A little help with bathing/dressing/bathroom;Assistance with cooking/housework;A little help with walking and/or  transfers   Equipment Recommendations  None recommended by PT    Recommendations for Other Services       Precautions / Restrictions Precautions Precautions: Fall Restrictions Weight Bearing Restrictions: No     Mobility  Bed Mobility               General bed mobility comments: seated in chair at beginning of session    Transfers Overall transfer level: Needs assistance Equipment used: Rolling walker (2 wheels) Transfers: Sit to/from Stand, Bed to chair/wheelchair/BSC Sit to Stand: Supervision, Min guard   Step pivot transfers: Supervision, Min guard            Ambulation/Gait Ambulation/Gait assistance: Min guard, Min assist Gait Distance (Feet): 45 Feet Assistive device: Rolling walker (2 wheels) Gait Pattern/deviations: Decreased step length - right, Decreased step length - left, Decreased stride length Gait velocity: decreased     General Gait Details: slow, labored cadence with RW   Stairs             Wheelchair Mobility    Modified Rankin (Stroke Patients Only)       Balance Overall balance assessment: Needs assistance Sitting-balance support: Feet supported, No upper extremity supported Sitting balance-Leahy Scale: Good Sitting balance - Comments: seated in chair   Standing balance support: During functional activity, Bilateral upper extremity supported Standing balance-Leahy Scale: Good Standing balance comment: with RW                            Cognition Arousal/Alertness: Awake/alert Behavior During Therapy: WFL for tasks assessed/performed Overall Cognitive Status: Within Functional Limits for tasks assessed  Exercises General Exercises - Lower Extremity Long Arc Quad: AROM, Both, 20 reps, Seated Hip Flexion/Marching: AROM, Both, 20 reps, Seated Toe Raises: AROM, Both, 20 reps, Seated Heel Raises: AROM, Both, 20 reps, Seated    General Comments         Pertinent Vitals/Pain Pain Assessment Pain Assessment: No/denies pain    Home Living                          Prior Function            PT Goals (current goals can now be found in the care plan section) Acute Rehab PT Goals Patient Stated Goal: return home with family to assist PT Goal Formulation: With patient Time For Goal Achievement: 09/11/21 Potential to Achieve Goals: Good Progress towards PT goals: Progressing toward goals    Frequency    Min 3X/week      PT Plan Current plan remains appropriate    Co-evaluation              AM-PAC PT "6 Clicks" Mobility   Outcome Measure  Help needed turning from your back to your side while in a flat bed without using bedrails?: A Little Help needed moving from lying on your back to sitting on the side of a flat bed without using bedrails?: A Little Help needed moving to and from a bed to a chair (including a wheelchair)?: A Little Help needed standing up from a chair using your arms (e.g., wheelchair or bedside chair)?: A Little Help needed to walk in hospital room?: A Little Help needed climbing 3-5 steps with a railing? : A Lot 6 Click Score: 17    End of Session   Activity Tolerance: Patient tolerated treatment well;Patient limited by fatigue Patient left: with call bell/phone within reach;in chair Nurse Communication: Mobility status PT Visit Diagnosis: Muscle weakness (generalized) (M62.81);Unsteadiness on feet (R26.81);Other abnormalities of gait and mobility (R26.89)     Time: 8264-1583 PT Time Calculation (min) (ACUTE ONLY): 12 min  Charges:  $Therapeutic Exercise: 8-22 mins                     10:56 AM, 09/08/21 Mearl Latin PT, DPT Physical Therapist at [facility]

## 2021-09-08 NOTE — Plan of Care (Signed)

## 2021-09-08 NOTE — Care Management Important Message (Signed)
Important Message  Patient Details  Name: Cheryl Jordan MRN: [id number] Date of Birth: Jul 05, 1941   Medicare Important Message Given:  Yes - Important Message mailed due to current National Emergency     Tommy Medal 09/08/2021, 1:17 PM

## 2021-09-08 NOTE — Progress Notes (Signed)
Nephrology Consult   Requesting provider: Roxan Hockey Service requesting consult: Hospitalist Reason for consult: AKI on CKD IV   Assessment/Recommendations: Cheryl Jordan is a/an 81 y.o. female with a past medical history cirrhosis 2/2 NASH, colon cancer, CKD 3b/4, DM2 h/w diarrhea, AKI, and anemia  Non-Oliguric AKI on CKD IV: CKD likely recurrent injury related to cirrhosis/LVPs essentially HRS type 2. Likely also some arterionephrosclerosis and DKD. Now w/ AKI with multiple factors that could have contributed such as volume shifts w/ LVP, anemia, and intermittent hypotension. She is being treated for HRS type 1 which is fine but much of this may be ATN -No role for further albumin or fluids -Continue midodrine and octreotide -Hold any diuretics -Avoid another LVP for now -Continue to monitor daily Cr, Dose meds for GFR -Monitor Daily I/Os, Daily weight  -Maintain MAP>65 for optimal renal perfusion.  -Avoid nephrotoxic medications including NSAIDs -Use synthetic opioids (Fentanyl/Dilaudid) if needed -If patient overall improves TIPS would likely be appropriate -F/u RUS  Cirrhosis 2/2 NASH: GI managing. Hold diuretics and further LVP for now  Anemia: likely mutlifactorial but concern for blood loss. S/p transfusion. No ESA for now. Further work up per primary/GI  C dif: cont oral vanc  Colon Cancer/GOC: agree with pal care  Metabolic Acidosis: mild w/ bicarb of 19. Inc oral bicarb to 650mg  BID   Recommendations conveyed to primary service.    [facility] 09/08/2021 1:13 PM   _____________________________________________________________________________________ CC: AKI on CKD IV  History of Present Illness: Cheryl Jordan is a/an 81 y.o. female with a past medical history of cirrhosis 2/2 NASH, colon cancer, CKD 3b/20m DM2 who presents with weakness.  Patient presented on 2/19 due to persistent and worsening weakness. Describes it to me as  being "sick and not getting any better." She was hospitalized for COVID from 2/3-2/9 and feels like she hasn't fully recovered from that, although respiratory symptoms have been minimal. She had a paracentesis a couple weeks ago and noticed fluid in her belly reaccurmulated quickly. She also has had several days of diarrhea that was non-bloody. She denies fevers, chills, SOB, CP but maybe had some nausea. No abdominal pain and no significant emesis.   She underwent LVP on 2/20 with 6L of fluid removed. Did receive albumin in a significant quantity around LVP. She was diagnosed with C dif colitis and started oral vanc on 09/06/21. She has had anemia w/ hgb <7 requiring transfusion yesterday. She has been started on octreotide and midodrine. Palliative care is involved.  Hard to define baseline accurately but lately Crt has been around 2.5. She has been considered for TIPS but currently MELD too high   Medications:  Current Facility-Administered Medications  Medication Dose Route Frequency Provider Last Rate Last Admin   (feeding supplement) PROSource Plus liquid 30 mL  30 mL Oral TID BM Barton Dubois, MD   30 mL at 09/08/21 1305   0.9 %  sodium chloride infusion   Intravenous PRN Barton Dubois, MD   Stopped at 09/06/21 5956   acetaminophen (TYLENOL) tablet 650 mg  650 mg Oral Q6H PRN Tawni Millers, MD   650 mg at 09/04/21 2040   Or   acetaminophen (TYLENOL) suppository 650 mg  650 mg Rectal Q6H PRN Arrien, Jimmy Picket, MD       benzonatate (TESSALON) capsule 100 mg  100 mg Oral TID PRN Tawni Millers, MD   100 mg at 09/05/21 2108   calcium carbonate (  TUMS - dosed in mg elemental calcium) chewable tablet 200 mg of elemental calcium  1 tablet Oral BID WC Zierle-Ghosh, Asia B, DO   200 mg of elemental calcium at 09/08/21 0803   calcium carbonate (TUMS - dosed in mg elemental calcium) chewable tablet 200 mg of elemental calcium  1 tablet Oral QID PRN Barton Dubois, MD        feeding supplement (ENSURE ENLIVE / ENSURE PLUS) liquid 237 mL  237 mL Oral TID BM Barton Dubois, MD   237 mL at 09/06/21 1346   levothyroxine (SYNTHROID) tablet 88 mcg  88 mcg Oral Daily Tawni Millers, MD   88 mcg at 09/08/21 0548   LORazepam (ATIVAN) tablet 0.25 mg  0.25 mg Oral Q8H PRN Barton Dubois, MD   0.25 mg at 09/08/21 1021   melatonin tablet 6 mg  6 mg Oral QHS Zierle-Ghosh, Asia B, DO   6 mg at 09/06/21 2012   midodrine (PROAMATINE) tablet 5 mg  5 mg Oral TID WC Rehman, Mechele Dawley, MD   5 mg at 09/08/21 1304   multivitamin with minerals tablet 1 tablet  1 tablet Oral Daily Barton Dubois, MD   1 tablet at 09/08/21 1021   octreotide (SANDOSTATIN) injection 100 mcg  100 mcg Subcutaneous Q12H Rogene Houston, MD   100 mcg at 09/08/21 0548   ondansetron (ZOFRAN) tablet 4 mg  4 mg Oral Q6H PRN Arrien, Jimmy Picket, MD   4 mg at 09/06/21 1140   Or   ondansetron (ZOFRAN) injection 4 mg  4 mg Intravenous Q6H PRN Arrien, Jimmy Picket, MD   4 mg at 09/06/21 1801   ondansetron (ZOFRAN) injection 4 mg  4 mg Intravenous Q6H Annitta Needs, NP   4 mg at 09/08/21 1304   pantoprazole (PROTONIX) EC tablet 40 mg  40 mg Oral BID AC Arrien, Jimmy Picket, MD   40 mg at 09/08/21 2585   prochlorperazine (COMPAZINE) injection 10 mg  10 mg Intravenous Q6H PRN Barton Dubois, MD   10 mg at 09/06/21 0518   saccharomyces boulardii (FLORASTOR) capsule 250 mg  250 mg Oral BID Barton Dubois, MD   250 mg at 09/08/21 1021   sodium bicarbonate tablet 650 mg  650 mg Oral BID Reesa Chew, MD   650 mg at 09/08/21 1021   sucralfate (CARAFATE) tablet 1 g  1 g Oral BID Arrien, Jimmy Picket, MD   1 g at 09/08/21 1021   vancomycin (VANCOCIN) capsule 125 mg  125 mg Oral QID Roxan Hockey, MD   125 mg at 09/08/21 1021     ALLERGIES Aspirin, Codeine, and Tape  MEDICAL HISTORY Past Medical History:  Diagnosis Date   Allergy    Anemia    hx of   Anxiety    Aortic stenosis    mild by 04/08/20  echo   Arthritis    Colon cancer ([place]) dx'd 11/2014   Diabetes mellitus without complication (HCC)    TYPE II   Dyspnea    Fatty liver    Gallbladder disease    GERD (gastroesophageal reflux disease)    Heart murmur    History of gout    Hypertension    Hypothyroidism    Kidney disorder    spot on left kidney   Osteoporosis    PONV (postoperative nausea and vomiting)      SOCIAL HISTORY Social History   Socioeconomic History   Marital status: Married    Spouse name: Not  on file   Number of children: 2   Years of education: Not on file   Highest education level: Not on file  Occupational History   Occupation: Retired  Tobacco Use   Smoking status: Never   Smokeless tobacco: Never  Vaping Use   Vaping Use: Never used  Substance and Sexual Activity   Alcohol use: No   Drug use: No   Sexual activity: Never  Other Topics Concern   Not on file  Social History Narrative   03/03/15-Married, husband Jeneen Rinks   #2 grown sons and #2 grand daughters   Social Determinants of Health   Financial Resource Strain: Not on file  Food Insecurity: Not on file  Transportation Needs: Not on file  Physical Activity: Not on file  Stress: Not on file  Social Connections: Not on file  Intimate Partner Violence: Not on file     FAMILY HISTORY Family History  Problem Relation Age of Onset   Diabetes Mother    Hypertension Other    COPD Father    Hypertension Sister    GER disease Sister    Heart attack Brother    COPD Brother    Lung cancer Brother    COPD Brother    Hypertension Brother       Review of Systems: 12 systems reviewed Otherwise as per HPI, all other systems reviewed and negative  Physical Exam: Vitals:   09/07/21 2059 09/08/21 0450  BP: (!) 132/49 117/61  Pulse: (!) 58 61  Resp: 14 18  Temp: 97.8 F (36.6 C) 98.7 F (37.1 C)  SpO2: 97% 95%   No intake/output data recorded.  Intake/Output Summary (Last 24 hours) at 09/08/2021 1313 Last data filed at  09/08/2021 0500 Gross per 24 hour  Intake 480 ml  Output --  Net 480 ml   General: well-appearing, no acute distress HEENT: anicteric sclera, oropharynx clear without lesions CV: regular rate, normal rhythm, no murmurs, no gallops, no rubs, no peripheral edema Lungs: clear to auscultation bilaterally, normal work of breathing Abd: soft, non-tender, mild -distention but definitely not taught Skin: no visible lesions or rashes Psych: alert, engaged, appropriate mood and affect Musculoskeletal: no obvious deformities Neuro: normal speech, no gross focal deficits   Test Results Reviewed Lab Results  Component Value Date   NA 138 09/08/2021   K 4.5 09/08/2021   CL 112 (H) 09/08/2021   CO2 19 (L) 09/08/2021   BUN 47 (H) 09/08/2021   CREATININE 3.47 (H) 09/08/2021   GFR 18.24 (L) 03/02/2021   CALCIUM 9.1 09/08/2021   ALBUMIN 3.6 09/08/2021   PHOS 5.0 (H) 08/08/2021   PHOS 4.9 (H) 08/08/2021    CBC Recent Labs  Lab 09/03/21 1217 09/04/21 0524 09/06/21 0445 09/07/21 0853 09/08/21 0553  WBC 4.8   < > 2.0* 3.6* 4.1  NEUTROABS 3.3  --  1.0* 2.1  --   HGB 10.6*   < > 6.9* 10.1* 9.2*  HCT 33.9*   < > 23.3* 32.4* 29.8*  MCV 92.4   < > 92.5 92.8 93.7  PLT 98*   < > 69* 82* 76*   < > = values in this interval not displayed.    I have reviewed all relevant outside healthcare records related to the patient's current hospitalization

## 2021-09-08 NOTE — Progress Notes (Signed)
PROGRESS NOTE     Cheryl Jordan, is a 81 y.o. female, DOB - February 19, 1941, HCW:[id number]  Admit date - 09/03/2021   Admitting Physician Mauricio Gerome Apley, MD  Outpatient Primary MD for the patient is Wannetta Sender, FNP  LOS - 4  Chief Complaint  Patient presents with   Weakness        Brief Narrative:  81 y.o. female with medical history significant of liver cirrhosis due to NASH, colon cancer, CKD stage 3b, T2DM, severe protein calorie malnutrition, who had a recent hospitalization 08/18/21 to 08/24/21 for SARS COVID 19 infection and decompensated liver cirrhosis    -Assessment and Plan:  1)Decompensated Liver Cirrhosis--  --Patient with recurrent ascites.  -IR evaluation last hospitalization she was found candidate for TIPS, but she did request more time before consenting for the procedure. --MELD score too high at this time to allow for TIPS -Status post 6 L ascitic fluid removal with paracentesis on 09/04/21 =-GI consult appreciated received albumin infusions -Continue octreotide and midodrine  2)C diff colitis--discussed with Dr. Laural Golden patient presented with diarrhea will treat with oral Vanco for 10 days (started 09/06/21)  3)Anemia and thrombocytopenia of chronic disease/liver cirrhosis-Hgb > 9 from 6.9 after transfusion -Platelets   76   -StopPED Lovenox -Transfused 1 units of PRBC previously -INR is down to 1.4 -No obvious bleeding noted  4)Acute renal failure superimposed on stage 3b chronic kidney disease renally adjust medications, avoid nephrotoxic agents / dehydration  / hypotension -Worsening renal function despite albumin infusion -Nephrology consult from Dr. Joylene Grapes appreciated -Pt has HRS type 1 , ??  Some component of ATN -No role for further albumin or fluids -Continue midodrine and octreotide -Hold any diuretics -Avoid another LVP for now -Follow creatinine  5) hypothyroidism--- continue levothyroxine  6)GERD--- continue  Protonix  7)DM2-A1c is 4.8 reflecting excellent diabetic control PTA -Patient is at risk for hypoglycemic episodes Use Novolog/Humalog Sliding scale insulin with Accu-Cheks/Fingersticks as ordered   8) adenocarcinoma of the colon/concerns for hepatocellular carcinoma--outpatient follow-up with oncology advised  Disposition/Need for in-Hospital Stay- patient unable to be discharged at this time due to --decompensated liver cirrhosis with anemia and worsening thrombocytopenia requiring transfusions and albumin infusions -Anticipate possible discharge home in a.m. with home health PT if creatinine and Hgb improves or stabilizes  Status is: Inpatient   Disposition: The patient is from: Home              Anticipated d/c is to: Home with [facility] PT              Anticipated d/c date is: 1 day              Patient currently is not medically stable to d/c. Barriers: Not Clinically Stable-   Code Status :  -  Code Status: DNR   Family Communication:    (patient is alert, awake and coherent) Discussed with son david Yahr---[phone]  DVT Prophylaxis  :   - SCDs   SCDs Start: 09/03/21 1809   Lab Results  Component Value Date   PLT 76 (L) 09/08/2021    Inpatient Medications  Scheduled Meds:  (feeding supplement) PROSource Plus  30 mL Oral TID BM   calcium carbonate  1 tablet Oral BID WC   feeding supplement  237 mL Oral TID BM   levothyroxine  88 mcg Oral Daily   melatonin  6 mg Oral QHS   midodrine  5 mg Oral TID WC   multivitamin with minerals  1 tablet Oral Daily   octreotide  100 mcg Subcutaneous Q12H   ondansetron (ZOFRAN) IV  4 mg Intravenous Q6H   pantoprazole  40 mg Oral BID AC   saccharomyces boulardii  250 mg Oral BID   sodium bicarbonate  650 mg Oral BID   sucralfate  1 g Oral BID   vancomycin  125 mg Oral QID   Continuous Infusions:  sodium chloride Stopped (09/06/21 0832)   PRN Meds:.sodium chloride, acetaminophen **OR** acetaminophen, benzonatate, calcium carbonate,  LORazepam, ondansetron **OR** ondansetron (ZOFRAN) IV, prochlorperazine   Anti-infectives (From admission, onward)    Start     Dose/Rate Route Frequency Ordered Stop   09/09/21 0000  vancomycin (VANCOCIN) 125 MG capsule        125 mg Oral 4 times daily 09/08/21 1912 09/17/21 2359   09/06/21 1800  vancomycin (VANCOCIN) capsule 125 mg        125 mg Oral 4 times daily 09/06/21 1631 09-24-21 1759         Subjective: Cheryl Jordan today has no fevers, no emesis,  No chest pain,   - -Less frequent loose stools -Generalized weakness and fatigue persist -Urine output is okay  Objective: Vitals:   09/07/21 1422 09/07/21 2059 09/08/21 0450 09/08/21 1351  BP: 122/63 (!) 132/49 117/61 (!) 115/51  Pulse: 73 (!) 58 61 67  Resp: 18 14 18 18   Temp: 98.3 F (36.8 C) 97.8 F (36.6 C) 98.7 F (37.1 C) 97.9 F (36.6 C)  TempSrc: Oral   Oral  SpO2: 95% 97% 95% 97%  Weight:      Height:        Intake/Output Summary (Last 24 hours) at 09/08/2021 1913 Last data filed at 09/08/2021 1700 Gross per 24 hour  Intake 960 ml  Output 5 ml  Net 955 ml   Filed Weights   09/03/21 1143 09/03/21 1633  Weight: 84.4 kg 80.8 kg    Physical Exam  Gen:- Awake Alert, no acute distress HEENT:- [place].AT, No sclera icterus Neck-Supple Neck,No JVD,.  Lungs-  CTAB , fair symmetrical air movement CV- S1, S2 normal, regular  Abd-  +ve B.Sounds, Abd Soft, No tenderness, increased truncal adiposity/ascites Extremity/Skin:- pedal pulses present  Psych-affect is appropriate, oriented x3 Neuro-generalized weakness no new focal deficits, no tremors  Data Reviewed: I have personally reviewed following labs and imaging studies  CBC: Recent Labs  Lab 09/03/21 1217 09/04/21 0524 09/06/21 0445 09/07/21 0853 09/08/21 0553  WBC 4.8 4.0 2.0* 3.6* 4.1  NEUTROABS 3.3  --  1.0* 2.1  --   HGB 10.6* 8.3* 6.9* 10.1* 9.2*  HCT 33.9* 26.9* 23.3* 32.4* 29.8*  MCV 92.4 91.8 92.5 92.8 93.7  PLT 98* 82* 69* 82* 76*    Basic Metabolic Panel: Recent Labs  Lab 09/03/21 1217 09/04/21 0524 09/05/21 1558 09/06/21 0445 09/07/21 0853 09/08/21 0553  NA 139 139 137 138 138 138  K 5.1 4.3 3.9 3.9 4.4 4.5  CL 112* 114* 112* 112* 109 112*  CO2 18* 17* 19* 19* 20* 19*  GLUCOSE 110* 84 174* 77 128* 113*  BUN 48* 47* 43* 42* 43* 47*  CREATININE 3.37* 3.10* 3.20* 2.93* 3.34* 3.47*  CALCIUM 8.8* 8.4* 8.6* 9.2 9.7 9.1  MG 1.9  --   --   --   --   --    GFR: Estimated Creatinine Clearance: 14.4 mL/min (A) (by C-G formula based on SCr of 3.47 mg/dL (H)). Liver Function Tests: Recent Labs  Lab 09/03/21 1217 09/05/21  1558 09/06/21 0445 09/07/21 0853 09/08/21 0553  AST 29 21 17 29 22   ALT 13 9 8 10 8   ALKPHOS 119 63 49 63 59  BILITOT 1.7* 1.0 1.2 2.1* 1.4*  PROT 5.8* 5.1* 5.6* 6.3* 5.3*  ALBUMIN 2.6* 3.6 4.2 4.5 3.6   Cardiac Enzymes: No results for input(s): CKTOTAL, CKMB, CKMBINDEX, TROPONINI in the last 168 hours. BNP (last 3 results) No results for input(s): PROBNP in the last 8760 hours. HbA1C: No results for input(s): HGBA1C in the last 72 hours. Sepsis Labs: @LABRCNTIP (procalcitonin:4,lacticidven:4) ) Recent Results (from the past 240 hour(s))  Culture, blood (routine x 2)     Status: None   Collection Time: 09/03/21 12:18 PM   Specimen: Left Antecubital; Blood  Result Value Ref Range Status   Specimen Description   Final    LEFT ANTECUBITAL BOTTLES DRAWN AEROBIC AND ANAEROBIC   Special Requests   Final    Blood Culture results may not be optimal due to an excessive volume of blood received in culture bottles   Culture   Final    NO GROWTH 5 DAYS Performed at [facility], [address] [place], [place] [address]    Report Status 09/08/2021 FINAL  Final  Culture, blood (routine x 2)     Status: None   Collection Time: 09/03/21 12:40 PM   Specimen: Right Antecubital; Blood  Result Value Ref Range Status   Specimen Description   Final    RIGHT ANTECUBITAL BOTTLES DRAWN AEROBIC AND  ANAEROBIC   Special Requests Blood Culture adequate volume  Final   Culture   Final    NO GROWTH 5 DAYS Performed at [facility], [address], [place], [place] [address]    Report Status 09/08/2021 FINAL  Final  C Difficile Quick Screen w PCR reflex     Status: Abnormal   Collection Time: 09/04/21 10:40 AM   Specimen: STOOL  Result Value Ref Range Status   C Diff antigen POSITIVE (A) NEGATIVE Final   C Diff toxin NEGATIVE NEGATIVE Final   C Diff interpretation Results are indeterminate. See PCR results.  Final    Comment: Performed at [facility], [address], [place], [place] [address]  Gram stain     Status: None   Collection Time: 09/04/21 11:29 AM   Specimen: Peritoneal Washings  Result Value Ref Range Status   Specimen Description PERITONEAL  Final   Special Requests NONE  Final   Gram Stain   Final    WBC PRESENT,BOTH PMN AND MONONUCLEAR NO ORGANISMS SEEN CYTOSPIN SMEAR Performed at [facility], [address] [place], [place] [address]    Report Status 09/04/2021 FINAL  Final  Culture, body fluid w Gram Stain-bottle     Status: None (Preliminary result)   Collection Time: 09/04/21 11:30 AM   Specimen: Peritoneal Washings  Result Value Ref Range Status   Specimen Description PERITONEAL 10cc  Final   Special Requests   Final    BOTTLES DRAWN AEROBIC AND ANAEROBIC Blood Culture adequate volume   Culture   Final    NO GROWTH 4 DAYS Performed at [facility], [address] [place], [place] [address]    Report Status PENDING  Incomplete  C. Diff by PCR, Reflexed     Status: Abnormal   Collection Time: 09/05/21  3:09 PM  Result Value Ref Range Status   Toxigenic C. Difficile by PCR POSITIVE (A) NEGATIVE Final    Comment: Positive for toxigenic C. difficile with little  to no toxin production. Only treat if clinical presentation suggests symptomatic illness. Performed at [facility], [place] [address], [place], [place] [address]     Radiology Studies: US  RENAL  Result Date: 10/08/2021 CLINICAL DATA:  Renal dysfunction EXAM: RENAL / URINARY TRACT ULTRASOUND COMPLETE COMPARISON:  CT abdomen done on 08/08/2021 FINDINGS: Right Kidney: Renal measurements: 10.2 x 4.6 x 4.3 cm = volume: 103.3 mL. There is no hydronephrosis. There is increased cortical echogenicity. Left Kidney: Renal measurements: 9.2 x 4.6 x 3.9 cm = volume: 86.3 mL. There is no hydronephrosis. There is increased cortical echogenicity. There is 2.8 x 2.5 cm cyst in the lateral margin of left kidney. Bladder: Urinary bladder is not distended and not adequately evaluated Other: Small ascites is present. There is nodularity in the liver surface suggesting possible cirrhosis. IMPRESSION: There is no hydronephrosis. There is increased cortical echogenicity suggesting medical renal disease. Left renal cyst. Ascites.  Cirrhosis of liver. Electronically Signed   By: Elmer Picker M.D.   On: 09/21/2021 13:44    Scheduled Meds:  (feeding supplement) PROSource Plus  30 mL Oral TID BM   calcium carbonate  1 tablet Oral BID WC   feeding supplement  237 mL Oral TID BM   levothyroxine  88 mcg Oral Daily   melatonin  6 mg Oral QHS   midodrine  5 mg Oral TID WC   multivitamin with minerals  1 tablet Oral Daily   octreotide  100 mcg Subcutaneous Q12H   ondansetron (ZOFRAN) IV  4 mg Intravenous Q6H   pantoprazole  40 mg Oral BID AC   saccharomyces boulardii  250 mg Oral BID   sodium bicarbonate  650 mg Oral BID   sucralfate  1 g Oral BID   vancomycin  125 mg Oral QID   Continuous Infusions:  sodium chloride Stopped (09/06/21 0832)     LOS: 4 days   Roxan Hockey M.D on September 15, 2021 at 7:13 PM  Go to [email or web address] - for contact info  [facility] - Office  [phone]  If 7PM-7AM, please contact night-coverage [email or web address] Password [facility] 10/04/2021, 7:13 PM

## 2021-09-09 LAB — CULTURE, BODY FLUID W GRAM STAIN -BOTTLE
Culture: NO GROWTH
Special Requests: ADEQUATE

## 2021-09-09 LAB — COMPREHENSIVE METABOLIC PANEL
ALT: 10 U/L (ref 0–44)
AST: 23 U/L (ref 15–41)
Albumin: 3.6 g/dL (ref 3.5–5.0)
Alkaline Phosphatase: 69 U/L (ref 38–126)
Anion gap: 8 (ref 5–15)
BUN: 51 mg/dL — ABNORMAL HIGH (ref 8–23)
CO2: 20 mmol/L — ABNORMAL LOW (ref 22–32)
Calcium: 9.2 mg/dL (ref 8.9–10.3)
Chloride: 111 mmol/L (ref 98–111)
Creatinine, Ser: 3.7 mg/dL — ABNORMAL HIGH (ref 0.44–1.00)
GFR, Estimated: 12 mL/min — ABNORMAL LOW (ref >60)
Glucose, Bld: 117 mg/dL — ABNORMAL HIGH (ref 70–99)
Potassium: 4.5 mmol/L (ref 3.5–5.1)
Sodium: 139 mmol/L (ref 135–145)
Total Bilirubin: 1.3 mg/dL — ABNORMAL HIGH (ref 0.3–1.2)
Total Protein: 5.5 g/dL — ABNORMAL LOW (ref 6.5–8.1)

## 2021-09-09 LAB — CBC
HCT: 30.8 % — ABNORMAL LOW (ref 36.0–46.0)
Hemoglobin: 9.3 g/dL — ABNORMAL LOW (ref 12.0–15.0)
MCH: 27.8 pg (ref 26.0–34.0)
MCHC: 30.2 g/dL (ref 30.0–36.0)
MCV: 92.2 fL (ref 80.0–100.0)
Platelets: 82 10*3/uL — ABNORMAL LOW (ref 150–400)
RBC: 3.34 MIL/uL — ABNORMAL LOW (ref 3.87–5.11)
RDW: 16.2 % — ABNORMAL HIGH (ref 11.5–15.5)
WBC: 4.2 10*3/uL (ref 4.0–10.5)
nRBC: 0 % (ref 0.0–0.2)

## 2021-09-09 LAB — PROTIME-INR
INR: 1.4 — ABNORMAL HIGH (ref 0.8–1.2)
Prothrombin Time: 17.5 seconds — ABNORMAL HIGH (ref 11.4–15.2)

## 2021-09-09 NOTE — Discharge Summary (Signed)
Cheryl Jordan, is a 81 y.o. female  DOB 03-11-1941  MRN [id number].  Admission date:  09/03/2021  Admitting Physician  Mauricio Gerome Apley, MD  Discharge Date:  09/09/2021   Primary MD  Wannetta Sender, FNP  Recommendations for primary care physician for things to follow:   1)follow up with [facility] gastroenterology in about a week for recheck and reevaluation 2) outpatient follow-up with nephrology service within a week advised for repeat BMP and further evaluation 3) outpatient palliative care follow-up advised  Admission Diagnosis  Weakness [R53.1] Other ascites [R18.8] Generalized weakness [R53.1]   Discharge Diagnosis  Weakness [R53.1] Other ascites [R18.8] Generalized weakness [R53.1]    Principal Problem:   Weakness Active Problems:   Hypothyroidism   GERD (gastroesophageal reflux disease)   Adenocarcinoma of colon (Cheryl Jordan)   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Cheryl Jordan)   AKI (acute kidney injury) (Cheryl Jordan)   Decompensated hepatic cirrhosis (Cheryl Jordan)   Diarrhea   Protein-calorie malnutrition, severe   Ascites   Anemia of chronic disease      Past Medical History:  Diagnosis Date   Allergy    Anemia    hx of   Anxiety    Aortic stenosis    mild by 04/08/20 echo   Arthritis    Colon cancer (Cheryl Jordan) dx'd 11/2014   Diabetes mellitus without complication (HCC)    TYPE II   Dyspnea    Fatty liver    Gallbladder disease    GERD (gastroesophageal reflux disease)    Heart murmur    History of gout    Hypertension    Hypothyroidism    Kidney disorder    spot on left kidney   Osteoporosis    PONV (postoperative nausea and vomiting)     Past Surgical History:  Procedure Laterality Date   BIOPSY  06/17/2020   Procedure: BIOPSY;  Surgeon: Irving Copas., MD;  Location: [facility];  Service: Gastroenterology;;   CATARACT EXTRACTION Left    CHOLECYSTECTOMY     COLONOSCOPY  WITH PROPOFOL N/A 06/17/2020   Procedure: COLONOSCOPY WITH PROPOFOL;  Surgeon: Irving Copas., MD;  Location: [facility];  Service: Gastroenterology;  Laterality: N/A;   COLONOSCOPY WITH PROPOFOL N/A 07/01/2020   Procedure: COLONOSCOPY WITH PROPOFOL;  Surgeon: Mauri Pole, MD;  Location: [facility];  Service: Endoscopy;  Laterality: N/A;   ESOPHAGOGASTRODUODENOSCOPY (EGD) WITH PROPOFOL N/A 06/17/2020   Procedure: ESOPHAGOGASTRODUODENOSCOPY (EGD) WITH PROPOFOL;  Surgeon: Rush Landmark Telford Nab., MD;  Location: [facility];  Service: Gastroenterology;  Laterality: N/A;   ESOPHAGOGASTRODUODENOSCOPY (EGD) WITH PROPOFOL N/A 07/01/2020   Procedure: ESOPHAGOGASTRODUODENOSCOPY (EGD) WITH PROPOFOL;  Surgeon: Mauri Pole, MD;  Location: [facility];  Service: Endoscopy;  Laterality: N/A;   ESOPHAGOGASTRODUODENOSCOPY (EGD) WITH PROPOFOL N/A 08/10/2021   Procedure: ESOPHAGOGASTRODUODENOSCOPY (EGD) WITH PROPOFOL;  Surgeon: Daryel November, MD;  Location: [facility];  Service: Gastroenterology;  Laterality: N/A;   FOREIGN BODY REMOVAL  06/17/2020   Procedure: FOREIGN BODY REMOVAL;  Surgeon: Rush Landmark Telford Nab., MD;  Location:  [facility];  Service: Gastroenterology;;   GI RADIOFREQUENCY ABLATION  06/17/2020   Procedure: GI RADIOFREQUENCY ABLATION;  Surgeon: Irving Copas., MD;  Location: [facility];  Service: Gastroenterology;;   HEMOSTASIS CLIP PLACEMENT  06/17/2020   Procedure: HEMOSTASIS CLIP PLACEMENT;  Surgeon: Irving Copas., MD;  Location: [facility];  Service: Gastroenterology;;   HEMOSTASIS CLIP PLACEMENT  07/01/2020   Procedure: HEMOSTASIS CLIP PLACEMENT;  Surgeon: Mauri Pole, MD;  Location: [facility];  Service: Endoscopy;;   HOT HEMOSTASIS N/A 07/01/2020   Procedure: HOT HEMOSTASIS (ARGON PLASMA COAGULATION/BICAP);  Surgeon: Mauri Pole, MD;  Location: [facility];  Service: Endoscopy;  Laterality: N/A;   HOT  HEMOSTASIS  08/10/2021   Procedure: HOT HEMOSTASIS (ARGON PLASMA COAGULATION/BICAP);  Surgeon: Daryel November, MD;  Location: [facility];  Service: Gastroenterology;;   IR PARACENTESIS  08/19/2020   IR PARACENTESIS  09/26/2020   IR PARACENTESIS  11/03/2020   IR PARACENTESIS  11/28/2020   IR PARACENTESIS  12/21/2020   IR PARACENTESIS  01/17/2021   IR PARACENTESIS  02/08/2021   IR PARACENTESIS  02/23/2021   IR PARACENTESIS  03/22/2021   IR PARACENTESIS  04/06/2021   IR PARACENTESIS  04/20/2021   IR PARACENTESIS  05/03/2021   IR PARACENTESIS  05/16/2021   IR PARACENTESIS  05/29/2021   IR PARACENTESIS  06/07/2021   IR PARACENTESIS  06/15/2021   IR PARACENTESIS  06/28/2021   IR PARACENTESIS  07/05/2021   IR PARACENTESIS  07/14/2021   IR PARACENTESIS  07/21/2021   IR PARACENTESIS  08/01/2021   IR RADIOLOGIST EVAL & MGMT  01/24/2017   IR RADIOLOGIST EVAL & MGMT  02/22/2021   IR RADIOLOGIST EVAL & MGMT  03/28/2021   IR RADIOLOGIST EVAL & MGMT  06/06/2021   IR RADIOLOGIST EVAL & MGMT  08/29/2021   KNEE CARTILAGE SURGERY Bilateral    NEPHRECTOMY Left 02/10/2015   Procedure: OPEN RETROPERINTONEAL EXPLORATION LEFT RENAL  CYST DECORTICATION X 5;  Surgeon: Cleon Gustin, MD;  Location: [facility];  Service: Urology;  Laterality: Left;   PARTIAL COLECTOMY N/A 02/10/2015   Procedure: OPEN RIGHT  COLECTOMY ;  Surgeon: [name], MD;  Location: [facility];  Service: General;  Laterality: N/A;   POLYPECTOMY  06/17/2020   Procedure: POLYPECTOMY;  Surgeon: Irving Copas., MD;  Location: [facility];  Service: Gastroenterology;;   RADIOLOGY WITH ANESTHESIA N/A 03/08/2021   Procedure: CT MICROWAVE ABLATION WITH ANESTHESIA;  Surgeon: Arne Cleveland, MD;  Location: [facility];  Service: Radiology;  Laterality: N/A;   TUBAL LIGATION       HPI  from the history and physical done on the day of admission:   HPI: Cheryl Jordan is a 80 y.o. female with medical history significant of liver cirrhosis due to  NASH, colon cancer, CKD stage 3b, T2DM, severe protein calorie malnutrition, who had a recent hospitalization 08/18/21 to 08/24/21 for SARS COVID 19 infection and decompensated liver cirrhosis. She was discharged home with home health services.    At home patient continue with progressive weakness, to the point she was not able to further ambulate independently. Very poor oral intake and worsening abdominal distention. Over last 2 to 3 days has been experiencing diarrhea that has been refractive to imodium.    Because of worsening weakness she was brought to the hospital for further evaluation. Denies any abdominal pain, nausea or vomiting, no dyspnea or chest pain.      Review of Systems: As mentioned in the history  of present illness. All other systems reviewed and are negative.    Hospital Course:     Brief Narrative:  81 y.o. female with medical history significant of liver cirrhosis due to NASH, colon cancer, CKD stage 3b, T2DM, severe protein calorie malnutrition, who had a recent hospitalization 08/18/21 to 08/24/21 for SARS COVID 19 infection and decompensated liver cirrhosis    -Assessment and Plan:   1)Decompensated Liver Cirrhosis--  --Patient with recurrent ascites.  -IR evaluation last hospitalization she was found candidate for TIPS, but she did request more time before consenting for the procedure. --MELD score too high at this time to allow for TIPS -Status post 6 L ascitic fluid removal with paracentesis on 09/04/21 =-GI consult appreciated received albumin infusions -treated with octreotide and midodrine -Ok to dc on Midodrine   2)C diff colitis--discussed with Dr. Laural Golden patient presented with diarrhea will treat with oral Vanco for 10 days (started 09/06/21)   3)Anemia and thrombocytopenia of chronic disease/liver cirrhosis-Hgb > 9 from 6.9 after transfusion -Platelets   82 -Stopped Lovenox -Transfused 1 units of PRBC previously -INR is down to 1.4 -No obvious  bleeding noted   4)Acute renal failure superimposed on stage 3b chronic kidney disease renally adjust medications, avoid nephrotoxic agents / dehydration  / hypotension -Worsening renal function despite albumin infusion -Nephrology consult from Dr. Joylene Grapes appreciated -Pt has HRS type 1 , ??  Some component of ATN -Nephrology team advised the following  -no role for further albumin or fluids -dc home on midodrine  -Hold off on further diuretics -Avoid another LVP for now -Creatinine trending up--- Discussed with Dr. Marval Regal on 09/09/21--no further inpatient interventions planned -He advises outpatient follow-up with Dr. Harrie Jeans who is patient's primary nephrologist   5) hypothyroidism--- continue levothyroxine   6)GERD--- continue Protonix   7)DM2-A1c is 4.8 reflecting excellent diabetic control PTA -Patient is at risk for hypoglycemic episodes -   8) adenocarcinoma of the colon/concerns for hepatocellular carcinoma--outpatient follow-up with oncology advised   Disposition- home with [facility]   Disposition: The patient is from: Home              Anticipated d/c is to: Home with [facility]  Discharge Condition: stable  Follow UP   Follow-up Information     [facility].   Specialty: [place] Why: [facility] staff will call you to schedule in home visits Contact information: [address] [place] [address] [phone]         [name], MD. Schedule an appointment as soon as possible for a visit in 1 week(s).   Specialty: Gastroenterology Contact information: [address] [place] [address] [phone]                  Consults obtained - Nephrology/Gi  Diet and Activity recommendation:  As advised  Discharge Instructions   Discharge Instructions     Amb Referral to Palliative Care   Complete by: As directed    Call MD for:  difficulty breathing, headache or visual disturbances    Complete by: As directed    Call MD for:  persistant dizziness or light-headedness   Complete by: As directed    Call MD for:  persistant nausea and vomiting   Complete by: As directed    Call MD for:  severe uncontrolled pain   Complete by: As directed    Call MD for:  temperature >100.4   Complete by:  As directed    Diet - low sodium heart healthy   Complete by: As directed    Discharge instructions   Complete by: As directed    1)follow up with [facility] gastroenterology in about a week for recheck and reevaluation 2) outpatient follow-up with nephrology service within a week advised for repeat BMP and further evaluation   Increase activity slowly   Complete by: As directed          Discharge Medications     Allergies as of 09/09/2021       Reactions   Aspirin Other (See Comments)   Runny nose    Codeine Nausea And Vomiting   Tape Other (See Comments)   Unnamed "medical tape" BLISTERS THE SKIN!!        Medication List     STOP taking these medications    Accu-Chek Aviva Plus test strip Generic drug: glucose blood   acetaminophen 500 MG tablet Commonly known as: TYLENOL   amLODipine 2.5 MG tablet Commonly known as: NORVASC   benzonatate 100 MG capsule Commonly known as: Tessalon Perles   guaiFENesin-dextromethorphan 100-10 MG/5ML syrup Commonly known as: ROBITUSSIN DM       TAKE these medications    GAS-X PO Take 1 tablet by mouth as needed (flatulence).   glipiZIDE 5 MG 24 hr tablet Commonly known as: GLUCOTROL XL Take 5 mg by mouth daily with breakfast.   lactose free nutrition Liqd Take 237 mLs by mouth 3 (three) times daily with meals.   levothyroxine 88 MCG tablet Commonly known as: SYNTHROID Take 1 tablet (88 mcg total) by mouth daily.   loperamide 2 MG capsule Commonly known as: IMODIUM Take 2 mg by mouth as needed for diarrhea or loose stools.   LORazepam 0.5 MG tablet Commonly known as: ATIVAN Take 0.5 mg by mouth 2 (two)  times daily as needed for anxiety.   midodrine 5 MG tablet Commonly known as: PROAMATINE Take 1 tablet (5 mg total) by mouth 3 (three) times daily with meals.   multivitamin with minerals Tabs tablet Take 1 tablet by mouth daily.   ondansetron 4 MG tablet Commonly known as: Zofran Take 1 tablet (4 mg total) by mouth every 8 (eight) hours as needed for nausea or vomiting.   pantoprazole 40 MG tablet Commonly known as: PROTONIX Take 1 tablet (40 mg total) by mouth 2 (two) times daily before a meal.   propranolol 20 MG tablet Commonly known as: INDERAL Take 1 tablet (20 mg total) by mouth 2 (two) times daily. What changed: medication strength   saccharomyces boulardii 250 MG capsule Commonly known as: FLORASTOR Take 1 capsule (250 mg total) by mouth 2 (two) times daily for 10 days.   sodium bicarbonate 650 MG tablet Take 1 tablet (650 mg total) by mouth 2 (two) times daily. What changed:  medication strength how much to take   sucralfate 1 g tablet Commonly known as: CARAFATE Take 1 tablet (1 g total) by mouth 2 (two) times daily.   vancomycin 125 MG capsule Commonly known as: VANCOCIN Take 1 capsule (125 mg total) by mouth 4 (four) times daily for 8 days.        Major procedures and Radiology Reports - PLEASE review detailed and final reports for all details, in brief   DG Chest 2 View  Result Date: 08/18/2021 CLINICAL DATA:  Weakness and loss of appetite. EXAM: CHEST - 2 VIEW COMPARISON:  earlier today FINDINGS: The heart size and mediastinal contours are  within normal limits. The lung volumes are low. There is no pleural effusion or edema. No airspace opacities. The visualized skeletal structures are unremarkable. IMPRESSION: Low lung volumes.  No acute findings. Electronically Signed   By: Kerby Moors M.D.   On: 08/18/2021 18:55   US RENAL  Result Date: 09/08/2021 CLINICAL DATA:  Renal dysfunction EXAM: RENAL / URINARY TRACT ULTRASOUND COMPLETE COMPARISON:  CT  abdomen done on 08/08/2021 FINDINGS: Right Kidney: Renal measurements: 10.2 x 4.6 x 4.3 cm = volume: 103.3 mL. There is no hydronephrosis. There is increased cortical echogenicity. Left Kidney: Renal measurements: 9.2 x 4.6 x 3.9 cm = volume: 86.3 mL. There is no hydronephrosis. There is increased cortical echogenicity. There is 2.8 x 2.5 cm cyst in the lateral margin of left kidney. Bladder: Urinary bladder is not distended and not adequately evaluated Other: Small ascites is present. There is nodularity in the liver surface suggesting possible cirrhosis. IMPRESSION: There is no hydronephrosis. There is increased cortical echogenicity suggesting medical renal disease. Left renal cyst. Ascites.  Cirrhosis of liver. Electronically Signed   By: Elmer Picker M.D.   On: 09/08/2021 13:44   MR ABDOMEN MRCP WO CONTRAST  Result Date: 08/11/2021 CLINICAL DATA:  Ascites, cirrhosis, hepatocellular carcinoma EXAM: MRI ABDOMEN WITHOUT CONTRAST  (INCLUDING MRCP) TECHNIQUE: Multiplanar multisequence MR imaging of the abdomen was performed. Heavily T2-weighted images of the biliary and pancreatic ducts were obtained, and three-dimensional MRCP images were rendered by post processing. COMPARISON:  CT abdomen and pelvis 08/08/2021, MRI abdomen 05/31/2021 FINDINGS: Study is limited due to lack of contrast, motion and large volume ascites. Lower chest: Small right and moderate left pleural effusions. Cardiomegaly. Hepatobiliary: Liver is nodular with inhomogeneous parenchyma consistent with cirrhosis. 3.5 x 2.4 cm heterogeneous mass in the right hepatic lobe consistent with previously ablated hepatocellular carcinoma which demonstrates peripheral hypointense T1, hyperintense T2 signal and central hyperintense T1, hypointense T2 signal. Mild surrounding hyperintense T2 signal hazy enhancement which extends towards the periphery/dome of the liver. No new hyperintense T2/DWI signal hepatic lesion identified. Gallbladder is  surgically absent. No biliary ductal dilatation identified. Pancreas: Mildly atrophic with no obvious mass or ductal dilatation visualized. Spleen:  Enlarged measuring 16 cm in length. Adrenals/Urinary Tract: Adrenal glands appear grossly normal. Kidneys are mildly atrophic and lobulated left greater than right. Multiple hyperintense T2 signal likely cysts identified bilaterally, left greater than right. No hydronephrosis identified. Stomach/Bowel: No evidence of bowel obstruction. Centralization of bowel loops secondary to ascites. Vascular/Lymphatic: No bulky lymphadenopathy appreciated given limitations. No abdominal aortic aneurysm demonstrated. Other:  Large volume ascites. Musculoskeletal: No suspicious bony lesions identified. IMPRESSION: 1. Large volume ascites. 2. Hepatic cirrhosis. Previously ablated mass in the right hepatic lobe. No definite new hepatic mass visualized. 3. Splenomegaly consistent with portal hypertension. 4. Renal atrophy and renal cortical cysts. 5. Small right and moderate left pleural effusions.  Cardiomegaly. Electronically Signed   By: Ofilia Neas M.D.   On: 08/11/2021 12:05   MR 3D Recon At Scanner  Result Date: 08/11/2021 CLINICAL DATA:  Ascites, cirrhosis, hepatocellular carcinoma EXAM: MRI ABDOMEN WITHOUT CONTRAST  (INCLUDING MRCP) TECHNIQUE: Multiplanar multisequence MR imaging of the abdomen was performed. Heavily T2-weighted images of the biliary and pancreatic ducts were obtained, and three-dimensional MRCP images were rendered by post processing. COMPARISON:  CT abdomen and pelvis 08/08/2021, MRI abdomen 05/31/2021 FINDINGS: Study is limited due to lack of contrast, motion and large volume ascites. Lower chest: Small right and moderate left pleural effusions. Cardiomegaly. Hepatobiliary: Liver is  nodular with inhomogeneous parenchyma consistent with cirrhosis. 3.5 x 2.4 cm heterogeneous mass in the right hepatic lobe consistent with previously ablated  hepatocellular carcinoma which demonstrates peripheral hypointense T1, hyperintense T2 signal and central hyperintense T1, hypointense T2 signal. Mild surrounding hyperintense T2 signal hazy enhancement which extends towards the periphery/dome of the liver. No new hyperintense T2/DWI signal hepatic lesion identified. Gallbladder is surgically absent. No biliary ductal dilatation identified. Pancreas: Mildly atrophic with no obvious mass or ductal dilatation visualized. Spleen:  Enlarged measuring 16 cm in length. Adrenals/Urinary Tract: Adrenal glands appear grossly normal. Kidneys are mildly atrophic and lobulated left greater than right. Multiple hyperintense T2 signal likely cysts identified bilaterally, left greater than right. No hydronephrosis identified. Stomach/Bowel: No evidence of bowel obstruction. Centralization of bowel loops secondary to ascites. Vascular/Lymphatic: No bulky lymphadenopathy appreciated given limitations. No abdominal aortic aneurysm demonstrated. Other:  Large volume ascites. Musculoskeletal: No suspicious bony lesions identified. IMPRESSION: 1. Large volume ascites. 2. Hepatic cirrhosis. Previously ablated mass in the right hepatic lobe. No definite new hepatic mass visualized. 3. Splenomegaly consistent with portal hypertension. 4. Renal atrophy and renal cortical cysts. 5. Small right and moderate left pleural effusions.  Cardiomegaly. Electronically Signed   By: Ofilia Neas M.D.   On: 08/11/2021 12:05   US Paracentesis  Result Date: 09/04/2021 INDICATION: Cirrhosis, ascites EXAM: ULTRASOUND GUIDED DIAGNOSTIC AND THERAPEUTIC PARACENTESIS MEDICATIONS: None. COMPLICATIONS: None immediate. PROCEDURE: Informed written consent was obtained from the patient after a discussion of the risks, benefits and alternatives to treatment. A timeout was performed prior to the initiation of the procedure. Initial ultrasound scanning demonstrates a large amount of ascites within the right  lower abdominal quadrant. The right lower abdomen was prepped and draped in the usual sterile fashion. 1% lidocaine was used for local anesthesia. Following this, a 5 French catheter was introduced. An ultrasound image was saved for documentation purposes. The paracentesis was performed. The catheter was removed and a dressing was applied. The patient tolerated the procedure well without immediate post procedural complication. Patient received post-procedure intravenous albumin; see nursing notes for details. FINDINGS: A total of approximately 6 L of yellow ascitic fluid was removed. Samples were sent to the laboratory as requested by the clinical team. IMPRESSION: Successful ultrasound-guided paracentesis yielding 6 liters of peritoneal fluid. Electronically Signed   By: Lavonia Dana M.D.   On: 09/04/2021 13:24   US Paracentesis  Result Date: 08/21/2021 INDICATION: Patient with a history of cirrhosis, hepatocellular carcinoma and recurrent large volume ascites presents today for therapeutic paracentesis with 6 L max. EXAM: ULTRASOUND GUIDED PARACENTESIS MEDICATIONS: 1% lidocaine 10 mL COMPLICATIONS: None immediate. PROCEDURE: Informed written consent was obtained from the patient after a discussion of the risks, benefits and alternatives to treatment. A timeout was performed prior to the initiation of the procedure. Initial ultrasound scanning demonstrates a large amount of ascites within the left lower abdominal quadrant. The left lower abdomen was prepped and draped in the usual sterile fashion. 1% lidocaine was used for local anesthesia. Following this, a 19 gauge, 7-cm, Yueh catheter was introduced. An ultrasound image was saved for documentation purposes. The paracentesis was performed. The catheter was removed and a dressing was applied. The patient tolerated the procedure well without immediate post procedural complication. Patient received post-procedure intravenous albumin; see nursing notes for details.  FINDINGS: A total of approximately 6 L of clear yellow fluid was removed. IMPRESSION: Successful ultrasound-guided paracentesis yielding 6 liters of peritoneal fluid. Read by: Soyla Dryer, NP Electronically Signed  By: Eugenie Filler M.D.   On: 08/21/2021 15:26   US Paracentesis  Result Date: 08/11/2021 INDICATION: Patient with prior history of colon cancer; now with cirrhosis, hepatocellular carcinoma, renal insufficiency, recurrent ascites. Request received for diagnostic and therapeutic paracentesis up to 3 liters. EXAM: ULTRASOUND GUIDED DIAGNOSTIC AND THERAPEUTIC PARACENTESIS MEDICATIONS: 10 mL 1% lidocaine COMPLICATIONS: None immediate. PROCEDURE: Informed written consent was obtained from the patient after a discussion of the risks, benefits and alternatives to treatment. A timeout was performed prior to the initiation of the procedure. Initial ultrasound scanning demonstrates a large amount of ascites within the right lower abdominal quadrant. The right lower abdomen was prepped and draped in the usual sterile fashion. 1% lidocaine was used for local anesthesia. Following this, a 19 gauge, 10-cm, Yueh catheter was introduced. An ultrasound image was saved for documentation purposes. The paracentesis was performed. The catheter was removed and a dressing was applied. The patient tolerated the procedure well without immediate post procedural complication. FINDINGS: A total of approximately 3 liters of slightly hazy, yellow fluid was removed. Samples were sent to the laboratory as requested by the clinical team. IMPRESSION: Successful ultrasound-guided diagnostic and therapeutic paracentesis yielding 3 liters of peritoneal fluid. Read by: Rowe Robert, PA-C Electronically Signed   By: Jerilynn Mages.  Shick M.D.   On: 08/11/2021 15:37   DG Chest Port 1 View  Result Date: 08/18/2021 CLINICAL DATA:  Generalized weakness EXAM: PORTABLE CHEST 1 VIEW COMPARISON:  03/02/2021 FINDINGS: Mild left basilar atelectasis. No  focal consolidation. No pleural effusion or pneumothorax. Heart and mediastinal contours are unremarkable. No acute osseous abnormality. IMPRESSION: No active disease. Electronically Signed   By: Kathreen Devoid M.D.   On: 08/18/2021 18:34   ECHOCARDIOGRAM COMPLETE  Result Date: 08/11/2021    ECHOCARDIOGRAM REPORT   Patient Name:   SHIVANI BARRANTES Heiden Date of Exam: 08/11/2021 Medical Rec #:  [id number]   Height:       67.0 in Accession #:    [id number]  Weight:       186.1 lb Date of Birth:  January 07, 1941    BSA:          1.961 m Patient Age:    59 years    BP:           96/68 mmHg Patient Gender: F           HR:           88 bpm. Exam Location:  Inpatient Procedure: 2D Echo, Cardiac Doppler and Color Doppler Indications:    murmur  History:        Patient has prior history of Echocardiogram examinations, most                 recent 04/08/2020. Signs/Symptoms:Dyspnea and Murmur; Risk                 Factors:Diabetes and Hypertension. AS / GERD.  Sonographer:    Beryle Beams Referring Phys: [id number] [place]  Sonographer Comments: No subcostal window. IMPRESSIONS  1. Left ventricular ejection fraction, by estimation, is 65 to 70%. The left ventricle has normal function. The left ventricle has no regional Echavarria motion abnormalities. Left ventricular diastolic parameters are consistent with Grade I diastolic dysfunction (impaired relaxation).  2. Right ventricular systolic function is normal. The right ventricular size is normal. Tricuspid regurgitation signal is inadequate for assessing PA pressure.  3. There is a trivial pericardial effusion at the apex.  4. The mitral valve is normal in  structure. No evidence of mitral valve regurgitation. No evidence of mitral stenosis.  5. The aortic valve is calcified. Aortic valve regurgitation is not visualized. Mild aortic valve stenosis. Aortic valve area, by VTI measures 1.36 cm. Aortic valve mean gradient measures 17.0 mmHg. Aortic valve Vmax measures 2.80 m/s. FINDINGS  Left  Ventricle: Left ventricular ejection fraction, by estimation, is 65 to 70%. The left ventricle has normal function. The left ventricle has no regional Husak motion abnormalities. The left ventricular internal cavity size was normal in size. There is  no left ventricular hypertrophy. Left ventricular diastolic parameters are consistent with Grade I diastolic dysfunction (impaired relaxation). Normal left ventricular filling pressure. Right Ventricle: The right ventricular size is normal. No increase in right ventricular Falter thickness. Right ventricular systolic function is normal. Tricuspid regurgitation signal is inadequate for assessing PA pressure. Left Atrium: Left atrial size was normal in size. Right Atrium: Right atrial size was normal in size. Pericardium: Trivial pericardial effusion is present. The pericardial effusion is surrounding the apex. Mitral Valve: The mitral valve is normal in structure. No evidence of mitral valve regurgitation. No evidence of mitral valve stenosis. Tricuspid Valve: The tricuspid valve is normal in structure. Tricuspid valve regurgitation is not demonstrated. No evidence of tricuspid stenosis. Aortic Valve: The aortic valve is calcified. Aortic valve regurgitation is not visualized. Mild aortic stenosis is present. Aortic valve mean gradient measures 17.0 mmHg. Aortic valve peak gradient measures 31.4 mmHg. Aortic valve area, by VTI measures 1.36 cm. Pulmonic Valve: The pulmonic valve was normal in structure. Pulmonic valve regurgitation is not visualized. No evidence of pulmonic stenosis. Aorta: The aortic root is normal in size and structure. Venous: The inferior vena cava was not well visualized. IAS/Shunts: No atrial level shunt detected by color flow Doppler.  LEFT VENTRICLE PLAX 2D LVIDd:         3.00 cm     Diastology LVIDs:         1.90 cm     LV e' medial:    5.11 cm/s LV PW:         1.00 cm     LV E/e' medial:  14.4 LV IVS:        1.10 cm     LV e' lateral:   7.18 cm/s  LVOT diam:     1.50 cm     LV E/e' lateral: 10.3 LV SV:         74 LV SV Index:   38 LVOT Area:     1.77 cm  LV Volumes (MOD) LV vol d, MOD A2C: 52.7 ml LV vol d, MOD A4C: 60.3 ml LV vol s, MOD A2C: 21.8 ml LV vol s, MOD A4C: 23.1 ml LV SV MOD A2C:     30.9 ml LV SV MOD A4C:     60.3 ml LV SV MOD BP:      34.1 ml RIGHT VENTRICLE RV Basal diam:  3.60 cm RV Mid diam:    3.40 cm RV S prime:     17.30 cm/s TAPSE (M-mode): 2.0 cm LEFT ATRIUM             Index        RIGHT ATRIUM           Index LA diam:        2.70 cm 1.38 cm/m   RA Area:     12.60 cm LA Vol (A2C):   26.2 ml 13.36 ml/m  RA Volume:   31.20  ml  15.91 ml/m LA Vol (A4C):   52.2 ml 26.62 ml/m LA Biplane Vol: 37.7 ml 19.22 ml/m  AORTIC VALVE                     PULMONIC VALVE AV Area (Vmax):    1.16 cm      PV Vmax:       1.10 m/s AV Area (Vmean):   1.14 cm      PV Vmean:      67.900 cm/s AV Area (VTI):     1.36 cm      PV VTI:        0.197 m AV Vmax:           280.00 cm/s   PV Peak grad:  4.8 mmHg AV Vmean:          193.000 cm/s  PV Mean grad:  2.0 mmHg AV VTI:            0.543 m AV Peak Grad:      31.4 mmHg AV Mean Grad:      17.0 mmHg LVOT Vmax:         184.00 cm/s LVOT Vmean:        125.000 cm/s LVOT VTI:          0.418 m LVOT/AV VTI ratio: 0.77  AORTA Ao Root diam: 3.00 cm Ao Asc diam:  3.00 cm MITRAL VALVE MV Area (PHT): 2.56 cm     SHUNTS MV Decel Time: 296 msec     Systemic VTI:  0.42 m MV E velocity: 73.60 cm/s   Systemic Diam: 1.50 cm MV A velocity: 124.00 cm/s MV E/A ratio:  0.59 Fransico Him MD Electronically signed by Fransico Him MD Signature Date/Time: 08/11/2021/3:11:23 PM    Final    US LIVER DOPPLER  Result Date: 08/19/2021 CLINICAL DATA:  Cirrhosis EXAM: DUPLEX ULTRASOUND OF LIVER TECHNIQUE: Color and duplex Doppler ultrasound was performed to evaluate the hepatic in-flow and out-flow vessels. COMPARISON:  MRI abdomen 08/11/2021 FINDINGS: Liver: Cirrhotic liver morphology. 2.9 cm echogenic mass noted in the right liver lobe  corresponds to the ablated lesion seen on prior MRI. Main Portal Vein size: 1.5 cm Portal Vein Velocities Main Prox:  15 cm/sec Main Mid: 10 cm/sec Main Dist:  16 cm/sec Right: 9 cm/sec Left: 13 cm/sec Hepatic Vein Velocities Right:  15 cm/sec Middle:  28 cm/sec Left:  19 cm/sec IVC: Present and patent with normal respiratory phasicity. Hepatic Artery Velocity:  72 cm/sec Splenic Vein Velocity:  20 cm/sec Spleen: 13 cm x 9 cm x 12 cm with a total volume of 755 cm^3 (411 cm^3 is upper limit normal) Portal Vein Occlusion/Thrombus: No Splenic Vein Occlusion/Thrombus: No Ascites: Mild abdominal ascites. Varices: None Incidental note made of left renal cyst measuring 6.6 cm and the left pleural effusion. IMPRESSION: 1. Patent portal vein with appropriate direction of flow. 2. Splenomegaly 3. Mild ascites 4. Cirrhotic liver morphology Electronically Signed   By: Miachel Roux M.D.   On: 08/19/2021 08:18   IR Radiologist Eval & Mgmt  Result Date: 08/29/2021 Please refer to notes tab for details about interventional procedure. (Op Note)   Micro Results  Recent Results (from the past 240 hour(s))  Culture, blood (routine x 2)     Status: None   Collection Time: 09/03/21 12:18 PM   Specimen: Left Antecubital; Blood  Result Value Ref Range Status   Specimen Description   Final    LEFT ANTECUBITAL BOTTLES  DRAWN AEROBIC AND ANAEROBIC   Special Requests   Final    Blood Culture results may not be optimal due to an excessive volume of blood received in culture bottles   Culture   Final    NO GROWTH 5 DAYS Performed at [facility], [address] [place], [place] [address]    Report Status 09/08/2021 FINAL  Final  Culture, blood (routine x 2)     Status: None   Collection Time: 09/03/21 12:40 PM   Specimen: Right Antecubital; Blood  Result Value Ref Range Status   Specimen Description   Final    RIGHT ANTECUBITAL BOTTLES DRAWN AEROBIC AND ANAEROBIC   Special Requests Blood Culture adequate volume  Final    Culture   Final    NO GROWTH 5 DAYS Performed at [facility], [address] [place], [place] [address]    Report Status 09/08/2021 FINAL  Final  C Difficile Quick Screen w PCR reflex     Status: Abnormal   Collection Time: 09/04/21 10:40 AM   Specimen: STOOL  Result Value Ref Range Status   C Diff antigen POSITIVE (A) NEGATIVE Final   C Diff toxin NEGATIVE NEGATIVE Final   C Diff interpretation Results are indeterminate. See PCR results.  Final    Comment: Performed at [facility], [address] [place], [place] [address]  Gram stain     Status: None   Collection Time: 09/04/21 11:29 AM   Specimen: Peritoneal Washings  Result Value Ref Range Status   Specimen Description PERITONEAL  Final   Special Requests NONE  Final   Gram Stain   Final    WBC PRESENT,BOTH PMN AND MONONUCLEAR NO ORGANISMS SEEN CYTOSPIN SMEAR Performed at [facility], [address] [place], [place] [address]    Report Status 09/04/2021 FINAL  Final  Culture, body fluid w Gram Stain-bottle     Status: None   Collection Time: 09/04/21 11:30 AM   Specimen: Peritoneal Washings  Result Value Ref Range Status   Specimen Description PERITONEAL 10cc  Final   Special Requests   Final    BOTTLES DRAWN AEROBIC AND ANAEROBIC Blood Culture adequate volume   Culture   Final    NO GROWTH 5 DAYS Performed at [facility], [address], [place], [place] [address]    Report Status 09/09/2021 FINAL  Final  C. Diff by PCR, Reflexed     Status: Abnormal   Collection Time: 09/05/21  3:09 PM  Result Value Ref Range Status   Toxigenic C. Difficile by PCR POSITIVE (A) NEGATIVE Final    Comment: Positive for toxigenic C. difficile with little to no toxin production. Only treat if clinical presentation suggests symptomatic illness. Performed at [facility], [place] [address], [place], [place] [address]     Today   Subjective    Floreen Comber today has no new complaints  -voiding ok  No Nausea, Vomiting or  Diarrhea          Patient has been seen and examined prior to discharge   Objective   Blood pressure (!) 114/51, pulse 70, temperature 98.3 F (36.8 C), temperature source Oral, resp. rate 18, height 5\' 7"  (1.702 m), weight 78.7 kg, SpO2 97 %.   Intake/Output Summary (Last 24 hours) at 09/09/2021 1252 Last data filed at 09/09/2021 0900 Gross per 24 hour  Intake 860 ml  Output --  Net 860 ml   Exam Gen:- Awake Alert, no acute distress HEENT:- [place].AT, No  sclera icterus Neck-Supple Neck,No JVD,.  Lungs-  CTAB , fair symmetrical air movement CV- S1, S2 normal, regular  Abd-  +ve B.Sounds, Abd Soft, No tenderness, increased truncal adiposity/ascites Extremity/Skin:- pedal pulses present  Psych-affect is appropriate, oriented x3 Neuro-generalized weakness no new focal deficits, no tremors   Data Review   CBC w Diff:  Lab Results  Component Value Date   WBC 4.2 09/09/2021   HGB 9.3 (L) 09/09/2021   HGB 10.7 (L) 06/13/2021   HGB 13.0 02/07/2017   HCT 30.8 (L) 09/09/2021   HCT 40.0 02/07/2017   PLT 82 (L) 09/09/2021   PLT 118 (L) 06/13/2021   PLT 103 (L) 02/07/2017   LYMPHOPCT 32 09/07/2021   LYMPHOPCT 41.1 02/07/2017   MONOPCT 6 09/07/2021   MONOPCT 5.2 02/07/2017   EOSPCT 3 09/07/2021   EOSPCT 1.3 02/07/2017   BASOPCT 1 09/07/2021   BASOPCT 0.2 02/07/2017    CMP:  Lab Results  Component Value Date   NA 139 09/09/2021   NA 141 02/07/2017   K 4.5 09/09/2021   K 4.4 02/07/2017   CL 111 09/09/2021   CO2 20 (L) 09/09/2021   CO2 25 02/07/2017   BUN 51 (H) 09/09/2021   BUN 18.2 02/07/2017   CREATININE 3.70 (H) 09/09/2021   CREATININE 2.46 (H) 03/21/2021   CREATININE 1.2 (H) 02/07/2017   PROT 5.5 (L) 09/09/2021   PROT 6.7 02/07/2017   ALBUMIN 3.6 09/09/2021   ALBUMIN 3.5 02/07/2017   BILITOT 1.3 (H) 09/09/2021   BILITOT 0.7 01/12/2021   BILITOT 0.74 02/07/2017   ALKPHOS 69 09/09/2021   ALKPHOS 103 02/07/2017   AST 23 09/09/2021   AST 19 01/12/2021   AST 42  (H) 02/07/2017   ALT 10 09/09/2021   ALT 10 01/12/2021   ALT 37 02/07/2017  .  Total Discharge time is about 33 minutes  Roxan Hockey M.D on 09/09/2021 at 12:52 PM  Go to [email or web address] -  for contact info  [facility] - Office  [phone]

## 2021-09-11 NOTE — Telephone Encounter (Signed)
Difficult situation, but appropriate. Thanks for the update

## 2021-09-12 ENCOUNTER — Other Ambulatory Visit: Payer: Self-pay | Admitting: Family Medicine

## 2021-09-12 ENCOUNTER — Telehealth: Payer: Self-pay | Admitting: Family Medicine

## 2021-09-12 ENCOUNTER — Other Ambulatory Visit: Payer: Self-pay

## 2021-09-12 NOTE — Telephone Encounter (Signed)
Went to pt's home, rang doorbell, waited and then knocked but no answer.  Called number provided that was her son, [name].  [name] provided pt's home phone number.  Called to number provided and phone went to voice mail.  Left message requesting call back to reschedule. Damaris Hippo FNP-C

## 2021-09-13 ENCOUNTER — Telehealth: Payer: Self-pay | Admitting: *Deleted

## 2021-09-13 NOTE — Telephone Encounter (Signed)
I called Ms. Kole on behalf of Dr. Serafina Royals to schedule a follow up to discuss the Tips procedure. Ms. Hersman states she does not want to schedule follow up and is not interested in having Tips procedure right now./vm ?

## 2021-09-14 ENCOUNTER — Telehealth: Payer: Self-pay

## 2021-09-14 NOTE — Telephone Encounter (Signed)
Spoke with patient's son [name] and rescheduled Palliative Care consult to 10/04/21 @ 2:30 PM.  ?

## 2021-09-15 ENCOUNTER — Inpatient Hospital Stay (HOSPITAL_COMMUNITY)
Admission: EM | Admit: 2021-09-15 | Discharge: 2021-10-14 | DRG: 951 | Disposition: E | Payer: Medicare Other | Attending: Family Medicine | Admitting: Family Medicine

## 2021-09-15 ENCOUNTER — Other Ambulatory Visit: Payer: Self-pay

## 2021-09-15 ENCOUNTER — Encounter (HOSPITAL_COMMUNITY): Payer: Self-pay | Admitting: *Deleted

## 2021-09-15 ENCOUNTER — Emergency Department (HOSPITAL_COMMUNITY): Payer: Medicare Other

## 2021-09-15 DIAGNOSIS — Z66 Do not resuscitate: Secondary | ICD-10-CM | POA: Diagnosis present

## 2021-09-15 DIAGNOSIS — J90 Pleural effusion, not elsewhere classified: Secondary | ICD-10-CM | POA: Diagnosis present

## 2021-09-15 DIAGNOSIS — E039 Hypothyroidism, unspecified: Secondary | ICD-10-CM | POA: Diagnosis present

## 2021-09-15 DIAGNOSIS — E872 Acidosis, unspecified: Secondary | ICD-10-CM | POA: Diagnosis present

## 2021-09-15 DIAGNOSIS — Z905 Acquired absence of kidney: Secondary | ICD-10-CM

## 2021-09-15 DIAGNOSIS — E43 Unspecified severe protein-calorie malnutrition: Secondary | ICD-10-CM | POA: Diagnosis present

## 2021-09-15 DIAGNOSIS — N184 Chronic kidney disease, stage 4 (severe): Secondary | ICD-10-CM | POA: Diagnosis present

## 2021-09-15 DIAGNOSIS — E875 Hyperkalemia: Secondary | ICD-10-CM | POA: Diagnosis present

## 2021-09-15 DIAGNOSIS — D72829 Elevated white blood cell count, unspecified: Secondary | ICD-10-CM | POA: Diagnosis present

## 2021-09-15 DIAGNOSIS — Z7984 Long term (current) use of oral hypoglycemic drugs: Secondary | ICD-10-CM

## 2021-09-15 DIAGNOSIS — Z515 Encounter for palliative care: Principal | ICD-10-CM

## 2021-09-15 DIAGNOSIS — I350 Nonrheumatic aortic (valve) stenosis: Secondary | ICD-10-CM | POA: Diagnosis present

## 2021-09-15 DIAGNOSIS — R64 Cachexia: Secondary | ICD-10-CM | POA: Diagnosis present

## 2021-09-15 DIAGNOSIS — Z8616 Personal history of COVID-19: Secondary | ICD-10-CM

## 2021-09-15 DIAGNOSIS — J9811 Atelectasis: Secondary | ICD-10-CM | POA: Diagnosis present

## 2021-09-15 DIAGNOSIS — K7581 Nonalcoholic steatohepatitis (NASH): Secondary | ICD-10-CM | POA: Diagnosis present

## 2021-09-15 DIAGNOSIS — D638 Anemia in other chronic diseases classified elsewhere: Secondary | ICD-10-CM | POA: Diagnosis present

## 2021-09-15 DIAGNOSIS — Z886 Allergy status to analgesic agent status: Secondary | ICD-10-CM

## 2021-09-15 DIAGNOSIS — E7220 Disorder of urea cycle metabolism, unspecified: Secondary | ICD-10-CM | POA: Diagnosis present

## 2021-09-15 DIAGNOSIS — Z825 Family history of asthma and other chronic lower respiratory diseases: Secondary | ICD-10-CM

## 2021-09-15 DIAGNOSIS — Z9049 Acquired absence of other specified parts of digestive tract: Secondary | ICD-10-CM

## 2021-09-15 DIAGNOSIS — Z885 Allergy status to narcotic agent status: Secondary | ICD-10-CM

## 2021-09-15 DIAGNOSIS — K219 Gastro-esophageal reflux disease without esophagitis: Secondary | ICD-10-CM | POA: Diagnosis present

## 2021-09-15 DIAGNOSIS — M810 Age-related osteoporosis without current pathological fracture: Secondary | ICD-10-CM | POA: Diagnosis present

## 2021-09-15 DIAGNOSIS — E11649 Type 2 diabetes mellitus with hypoglycemia without coma: Secondary | ICD-10-CM | POA: Diagnosis present

## 2021-09-15 DIAGNOSIS — K7460 Unspecified cirrhosis of liver: Secondary | ICD-10-CM | POA: Diagnosis present

## 2021-09-15 DIAGNOSIS — C189 Malignant neoplasm of colon, unspecified: Secondary | ICD-10-CM | POA: Diagnosis not present

## 2021-09-15 DIAGNOSIS — Z85038 Personal history of other malignant neoplasm of large intestine: Secondary | ICD-10-CM

## 2021-09-15 DIAGNOSIS — F419 Anxiety disorder, unspecified: Secondary | ICD-10-CM | POA: Diagnosis present

## 2021-09-15 DIAGNOSIS — R188 Other ascites: Secondary | ICD-10-CM

## 2021-09-15 DIAGNOSIS — Z833 Family history of diabetes mellitus: Secondary | ICD-10-CM

## 2021-09-15 DIAGNOSIS — K7682 Hepatic encephalopathy: Secondary | ICD-10-CM | POA: Diagnosis present

## 2021-09-15 DIAGNOSIS — Z6827 Body mass index (BMI) 27.0-27.9, adult: Secondary | ICD-10-CM | POA: Diagnosis not present

## 2021-09-15 DIAGNOSIS — I129 Hypertensive chronic kidney disease with stage 1 through stage 4 chronic kidney disease, or unspecified chronic kidney disease: Secondary | ICD-10-CM | POA: Diagnosis present

## 2021-09-15 DIAGNOSIS — Z8249 Family history of ischemic heart disease and other diseases of the circulatory system: Secondary | ICD-10-CM

## 2021-09-15 DIAGNOSIS — E1122 Type 2 diabetes mellitus with diabetic chronic kidney disease: Secondary | ICD-10-CM | POA: Diagnosis present

## 2021-09-15 DIAGNOSIS — Z79899 Other long term (current) drug therapy: Secondary | ICD-10-CM

## 2021-09-15 DIAGNOSIS — M109 Gout, unspecified: Secondary | ICD-10-CM | POA: Diagnosis present

## 2021-09-15 DIAGNOSIS — Z801 Family history of malignant neoplasm of trachea, bronchus and lung: Secondary | ICD-10-CM

## 2021-09-15 DIAGNOSIS — Z9842 Cataract extraction status, left eye: Secondary | ICD-10-CM

## 2021-09-15 DIAGNOSIS — R0603 Acute respiratory distress: Secondary | ICD-10-CM | POA: Diagnosis present

## 2021-09-15 DIAGNOSIS — Z79890 Hormone replacement therapy (postmenopausal): Secondary | ICD-10-CM

## 2021-09-15 DIAGNOSIS — R4189 Other symptoms and signs involving cognitive functions and awareness: Secondary | ICD-10-CM | POA: Diagnosis present

## 2021-09-15 DIAGNOSIS — D631 Anemia in chronic kidney disease: Secondary | ICD-10-CM | POA: Diagnosis present

## 2021-09-15 LAB — CBC WITH DIFFERENTIAL/PLATELET
Abs Immature Granulocytes: 0.06 10*3/uL (ref 0.00–0.07)
Basophils Absolute: 0 10*3/uL (ref 0.0–0.1)
Basophils Relative: 0 %
Eosinophils Absolute: 0 10*3/uL (ref 0.0–0.5)
Eosinophils Relative: 0 %
HCT: 34.5 % — ABNORMAL LOW (ref 36.0–46.0)
Hemoglobin: 10.8 g/dL — ABNORMAL LOW (ref 12.0–15.0)
Immature Granulocytes: 1 %
Lymphocytes Relative: 15 %
Lymphs Abs: 1.7 10*3/uL (ref 0.7–4.0)
MCH: 28.6 pg (ref 26.0–34.0)
MCHC: 31.3 g/dL (ref 30.0–36.0)
MCV: 91.5 fL (ref 80.0–100.0)
Monocytes Absolute: 0.4 10*3/uL (ref 0.1–1.0)
Monocytes Relative: 3 %
Neutro Abs: 9.2 10*3/uL — ABNORMAL HIGH (ref 1.7–7.7)
Neutrophils Relative %: 81 %
Platelets: 161 10*3/uL (ref 150–400)
RBC: 3.77 MIL/uL — ABNORMAL LOW (ref 3.87–5.11)
RDW: 17.3 % — ABNORMAL HIGH (ref 11.5–15.5)
WBC: 11.3 10*3/uL — ABNORMAL HIGH (ref 4.0–10.5)
nRBC: 0 % (ref 0.0–0.2)

## 2021-09-15 LAB — PROTIME-INR
INR: 1.2 (ref 0.8–1.2)
Prothrombin Time: 15.4 seconds — ABNORMAL HIGH (ref 11.4–15.2)

## 2021-09-15 LAB — RESP PANEL BY RT-PCR (FLU A&B, COVID) ARPGX2
Influenza A by PCR: NEGATIVE
Influenza B by PCR: NEGATIVE
SARS Coronavirus 2 by RT PCR: POSITIVE — AB

## 2021-09-15 LAB — COMPREHENSIVE METABOLIC PANEL
ALT: 20 U/L (ref 0–44)
AST: 47 U/L — ABNORMAL HIGH (ref 15–41)
Albumin: 3.7 g/dL (ref 3.5–5.0)
Alkaline Phosphatase: 127 U/L — ABNORMAL HIGH (ref 38–126)
Anion gap: 10 (ref 5–15)
BUN: 61 mg/dL — ABNORMAL HIGH (ref 8–23)
CO2: 21 mmol/L — ABNORMAL LOW (ref 22–32)
Calcium: 8.7 mg/dL — ABNORMAL LOW (ref 8.9–10.3)
Chloride: 103 mmol/L (ref 98–111)
Creatinine, Ser: 3.42 mg/dL — ABNORMAL HIGH (ref 0.44–1.00)
GFR, Estimated: 13 mL/min — ABNORMAL LOW (ref >60)
Glucose, Bld: 73 mg/dL (ref 70–99)
Potassium: 5.2 mmol/L — ABNORMAL HIGH (ref 3.5–5.1)
Sodium: 134 mmol/L — ABNORMAL LOW (ref 135–145)
Total Bilirubin: 1.3 mg/dL — ABNORMAL HIGH (ref 0.3–1.2)
Total Protein: 6.7 g/dL (ref 6.5–8.1)

## 2021-09-15 LAB — CBG MONITORING, ED: Glucose-Capillary: 91 mg/dL (ref 70–99)

## 2021-09-15 LAB — AMMONIA: Ammonia: 52 umol/L — ABNORMAL HIGH (ref 9–35)

## 2021-09-15 LAB — LACTIC ACID, PLASMA: Lactic Acid, Venous: 2 mmol/L (ref 0.5–1.9)

## 2021-09-15 MED ORDER — LORAZEPAM 2 MG/ML PO CONC: 1.0000 mg | ORAL | Status: DC | PRN

## 2021-09-15 MED ORDER — LORAZEPAM 2 MG/ML IJ SOLN: 1.0000 mg | INTRAMUSCULAR | Status: DC | PRN

## 2021-09-15 MED ORDER — MORPHINE 100MG IN NS 100ML (1MG/ML) PREMIX INFUSION
3.0000 mg/h | INTRAVENOUS | Status: DC
  Administered 2021-09-15: 2.5 mg/h via INTRAVENOUS
  Filled 2021-09-15: qty 100

## 2021-09-15 MED ORDER — NYSTATIN 100000 UNIT/GM EX POWD: Freq: Three times a day (TID) | CUTANEOUS | Status: DC | PRN

## 2021-09-15 MED ORDER — ACETAMINOPHEN 325 MG PO TABS: 650.0000 mg | ORAL_TABLET | Freq: Four times a day (QID) | ORAL | Status: DC | PRN

## 2021-09-15 MED ORDER — DIPHENHYDRAMINE HCL 50 MG/ML IJ SOLN: 12.5000 mg | INTRAMUSCULAR | Status: DC | PRN

## 2021-09-15 MED ORDER — ONDANSETRON HCL 4 MG/2ML IJ SOLN: 4.0000 mg | Freq: Four times a day (QID) | INTRAMUSCULAR | Status: DC | PRN

## 2021-09-15 MED ORDER — ACETAMINOPHEN 650 MG RE SUPP: 650.0000 mg | Freq: Four times a day (QID) | RECTAL | Status: DC | PRN

## 2021-09-15 MED ORDER — ONDANSETRON 4 MG PO TBDP: 4.0000 mg | ORAL_TABLET | Freq: Four times a day (QID) | ORAL | Status: DC | PRN

## 2021-09-15 MED ORDER — ETOMIDATE 2 MG/ML IV SOLN
INTRAVENOUS | Status: AC
  Filled 2021-09-15: qty 20

## 2021-09-15 MED ORDER — GLYCOPYRROLATE 1 MG PO TABS
1.0000 mg | ORAL_TABLET | ORAL | Status: DC | PRN
  Filled 2021-09-15: qty 1

## 2021-09-15 MED ORDER — GLYCOPYRROLATE 0.2 MG/ML IJ SOLN: 0.2000 mg | INTRAMUSCULAR | Status: DC | PRN

## 2021-09-15 MED ORDER — ROCURONIUM BROMIDE 10 MG/ML (PF) SYRINGE
PREFILLED_SYRINGE | INTRAVENOUS | Status: AC
  Filled 2021-09-15: qty 10

## 2021-09-15 MED ORDER — MORPHINE BOLUS VIA INFUSION
2.0000 mg | INTRAVENOUS | Status: DC | PRN
  Filled 2021-09-15: qty 2

## 2021-09-15 MED ORDER — LORAZEPAM 1 MG PO TABS: 1.0000 mg | ORAL_TABLET | ORAL | Status: DC | PRN

## 2021-09-15 MED ORDER — BIOTENE DRY MOUTH MT LIQD: 15.0000 mL | OROMUCOSAL | Status: DC | PRN

## 2021-09-15 MED ORDER — GLYCOPYRROLATE 0.2 MG/ML IJ SOLN
0.2000 mg | INTRAMUSCULAR | Status: DC | PRN
  Administered 2021-09-15 – 2021-09-16 (×2): 0.2 mg via INTRAVENOUS
  Filled 2021-09-15 (×3): qty 1

## 2021-09-15 MED ORDER — POLYVINYL ALCOHOL 1.4 % OP SOLN: 1.0000 [drp] | Freq: Four times a day (QID) | OPHTHALMIC | Status: DC | PRN

## 2021-09-15 NOTE — Assessment & Plan Note (Signed)
Pt will begin full comfort measures  ?Anticipate patient has minutes to hours.  ?

## 2021-09-15 NOTE — Assessment & Plan Note (Signed)
IV morphine ordered for comfort and respiratory distress.  ?

## 2021-09-15 NOTE — Hospital Course (Addendum)
81 year old female with decompensated Cheryl Jordan cirrhosis with multiple recent hospitalizations, history of hepatic encephalopathy, colon cancer, stage IIIb CKD, type 2 diabetes mellitus, severe protein calorie malnutrition, recent SARS COVID infection and recent C. difficile infection, recently declined TIPS procedure, missed home hospice appointment recently and it was rescheduled for 3/22.  Patient was brought in by [facility] from home with unresponsiveness.  Her CBG was down to 50.  She was given D50 by EMS with improvement of BS to 99 however subsequently dropped again to 50 and required an additional D50 infusion.  Patient arrived unresponsive with acute severe respiratory distress.  Patient is DNR/DNI.  Patient appears terminal and labs revealed elevated ammonia at 52, potassium 5.2, creatinine 3.42, bilirubin 1.3, lactic acid 2.0, WBC 11.3, hemoglobin 10.8.  Chest x-ray with findings of significantly worsening although moderate left pleural effusion with atelectasis of the lungs.  After discussion with patient's son he wanted to honor patient's DNR and no extraordinary measures and requested patient be kept comfortable as her prognosis remains terminal and likely could expire in hours.  Patient is being admitted for initiation of full comfort measures.  Prognosis is terminal.  Pt is expected to die in hospital presumably within hours.   ? ?09/22/2021: Pt much more rested and breathing better on IV morphine infusion.  Family remain at bedside.  Some periods apnea seen.  ? ?2021-09-22: Pt expired and pronounced at 0935.  ?

## 2021-09-15 NOTE — Assessment & Plan Note (Signed)
IV morphine ordered for comfort and respiratory distress. ?

## 2021-09-15 NOTE — Assessment & Plan Note (Signed)
Full comfort measures °

## 2021-09-15 NOTE — Assessment & Plan Note (Signed)
Full comfort care orderset bundle utilized ?Unrestricted visitation for family  ?Anticipate patient has minutes to hours.  ?

## 2021-09-15 NOTE — ED Provider Notes (Signed)
[facility] EMERGENCY DEPARTMENT Provider Note   CSN: [id number] Arrival date & time: 09/17/2021  1026     History  Chief Complaint  Patient presents with   Unresponsive    Cheryl Jordan is a 81 y.o. female.  HPI Patient brought in unresponsive.  For a mild hypoglycemia at home.  History of Nash cirrhosis.  Patient cannot provide any history.  Does have some edema on legs.  No gag reflex.   Past Medical History:  Diagnosis Date   Allergy    Anemia    hx of   Anxiety    Aortic stenosis    mild by 04/08/20 echo   Arthritis    Colon cancer ([place]) dx'd 11/2014   Diabetes mellitus without complication (HCC)    TYPE II   Dyspnea    Fatty liver    Gallbladder disease    GERD (gastroesophageal reflux disease)    Heart murmur    History of gout    Hypertension    Hypothyroidism    Kidney disorder    spot on left kidney   Osteoporosis    PONV (postoperative nausea and vomiting)     Home Medications Prior to Admission medications   Medication Sig Start Date End Date Taking? Authorizing Provider  glipiZIDE (GLUCOTROL XL) 5 MG 24 hr tablet Take 5 mg by mouth daily with breakfast.    [provider]  lactose free nutrition (BOOST PLUS) LIQD Take 237 mLs by mouth 3 (three) times daily with meals. Patient not taking: Reported on 08/19/2021 08/11/21   Lavina Hamman, MD  levothyroxine (SYNTHROID) 88 MCG tablet Take 1 tablet (88 mcg total) by mouth daily. 12/04/19   Brunetta Jeans, PA-C  loperamide (IMODIUM) 2 MG capsule Take 2 mg by mouth as needed for diarrhea or loose stools.    [provider]  LORazepam (ATIVAN) 0.5 MG tablet Take 0.5 mg by mouth 2 (two) times daily as needed for anxiety.    [provider]  midodrine (PROAMATINE) 5 MG tablet Take 1 tablet (5 mg total) by mouth 3 (three) times daily with meals. 09/09/21   Roxan Hockey, MD  Multiple Vitamin (MULTIVITAMIN WITH MINERALS) TABS tablet Take 1 tablet by mouth daily. 09/09/21   Roxan Hockey, MD  ondansetron (ZOFRAN) 4 MG tablet Take 1 tablet (4 mg total) by mouth every 8 (eight) hours as needed for nausea or vomiting. 08/24/21 08/24/22  Rai, Vernelle Emerald, MD  pantoprazole (PROTONIX) 40 MG tablet Take 1 tablet (40 mg total) by mouth 2 (two) times daily before a meal. 09/08/21   Emokpae, Courage, MD  propranolol (INDERAL) 20 MG tablet Take 1 tablet (20 mg total) by mouth 2 (two) times daily. 09/08/21   Roxan Hockey, MD  saccharomyces boulardii (FLORASTOR) 250 MG capsule Take 1 capsule (250 mg total) by mouth 2 (two) times daily for 10 days. 09/08/21 09/18/21  Roxan Hockey, MD  Simethicone (GAS-X PO) Take 1 tablet by mouth as needed (flatulence).    [provider]  sodium bicarbonate 650 MG tablet Take 1 tablet (650 mg total) by mouth 2 (two) times daily. 09/08/21   Roxan Hockey, MD  sucralfate (CARAFATE) 1 g tablet Take 1 tablet (1 g total) by mouth 2 (two) times daily. 09/08/21   Roxan Hockey, MD  vancomycin (VANCOCIN) 125 MG capsule Take 1 capsule (125 mg total) by mouth 4 (four) times daily for 8 days. 09/09/21 09/17/21  Roxan Hockey, MD      Allergies  Aspirin, Codeine, and Tape    Review of Systems   Review of Systems  Unable to perform ROS: Mental status change   Physical Exam Updated Vital Signs BP (!) 116/57    Pulse 65    Temp 98.2 F (36.8 C) (Axillary)    Resp (!) 25    Ht 5\' 7"  (1.702 m)    Wt 78.7 kg    SpO2 100%    BMI 27.16 kg/m  Physical Exam Vitals reviewed.  HENT:     Head: Atraumatic.  Eyes:     Comments: Pupils mildly constricted.  Cardiovascular:     Rate and Rhythm: Regular rhythm.  Pulmonary:     Comments: Harsh breath sounds with some upper airway sounds. Abdominal:     Tenderness: There is no abdominal tenderness.  Musculoskeletal:     Cervical back: Neck supple.     Right lower leg: Edema present.     Left lower leg: Edema present.  Neurological:     Comments: Breathing spontaneously.  No gag reflex.    ED  Results / Procedures / Treatments   Labs (all labs ordered are listed, but only abnormal results are displayed) Labs Reviewed  PROTIME-INR - Abnormal; Notable for the following components:      Result Value   Prothrombin Time 15.4 (*)    All other components within normal limits  CBC WITH DIFFERENTIAL/PLATELET - Abnormal; Notable for the following components:   WBC 11.3 (*)    RBC 3.77 (*)    Hemoglobin 10.8 (*)    HCT 34.5 (*)    RDW 17.3 (*)    Neutro Abs 9.2 (*)    All other components within normal limits  COMPREHENSIVE METABOLIC PANEL - Abnormal; Notable for the following components:   Sodium 134 (*)    Potassium 5.2 (*)    CO2 21 (*)    BUN 61 (*)    Creatinine, Ser 3.42 (*)    Calcium 8.7 (*)    AST 47 (*)    Alkaline Phosphatase 127 (*)    Total Bilirubin 1.3 (*)    GFR, Estimated 13 (*)    All other components within normal limits  AMMONIA - Abnormal; Notable for the following components:   Ammonia 52 (*)    All other components within normal limits  LACTIC ACID, PLASMA - Abnormal; Notable for the following components:   Lactic Acid, Venous 2.0 (*)    All other components within normal limits  RESP PANEL BY RT-PCR (FLU A&B, COVID) [id number]  CBG MONITORING, ED    EKG EKG Interpretation  Date/Time:  Friday September 15 2021 10:36:00 EST Ventricular Rate:  70 PR Interval:  70 QRS Duration: 80 QT Interval:  439 QTC Calculation: 460 R Axis:   54 Text Interpretation: Sinus rhythm Atrial premature complexes Short PR interval Low voltage, extremity leads Anteroseptal infarct, old Baseline wander Confirmed by Davonna Belling [phone]) on 10/02/2021 11:03:03 AM  Radiology DG Chest Portable 1 View  Result Date: 09/25/2021 CLINICAL DATA:  Pt brought in by [facility] from home with c/o unresponsiveness. EMS reports CBG was 50, D50 was given with increase of BG to 99. BG then dropped to 50 again and D50 given again. Pt arrives to ED unresponsive. LKW last night when she went to bed,  unknown time. EXAM: PORTABLE CHEST 1 VIEW COMPARISON:  Prior exams, most recent dated 08/18/2021. FINDINGS: Moderate left pleural effusion obscures the hemidiaphragm and portions of the left heart border, increased compared to the  prior chest radiograph. There is associated opacity in the left perihilar and lower lung that is likely a combination of posterior layering pleural fluid and atelectasis. Right lung is clear.  No right pleural effusion. No pneumothorax. Cardiac silhouette is top-normal in size. No mediastinal or hilar masses. Skeletal structures are demineralized, but grossly intact. IMPRESSION: 1. Moderate left pleural effusion, significantly increased when compared to the prior chest radiograph, associated with dependent lung opacity that is most likely atelectasis. Pneumonia not excluded. No convincing pulmonary edema. Electronically Signed   By: Lajean Manes M.D.   On: 09/23/2021 11:15    Procedures Procedures    Medications Ordered in ED Medications  rocuronium bromide 100 MG/10ML SOSY (  Not Given 09/28/2021 1043)  etomidate (AMIDATE) 2 MG/ML injection (  Not Given 09/30/2021 1043)  acetaminophen (TYLENOL) tablet 650 mg (has no administration in time range)    Or  acetaminophen (TYLENOL) suppository 650 mg (has no administration in time range)  morphine bolus via infusion 2 mg (has no administration in time range)  morphine 100mg  in NS 128mL (1mg /mL) infusion - premix (2.5 mg/hr Intravenous New Bag/Given 09/28/2021 1324)  LORazepam (ATIVAN) tablet 1 mg (has no administration in time range)    Or  LORazepam (ATIVAN) injection 1 mg (has no administration in time range)  diphenhydrAMINE (BENADRYL) injection 12.5 mg (has no administration in time range)  ondansetron (ZOFRAN-ODT) disintegrating tablet 4 mg (has no administration in time range)    Or  ondansetron (ZOFRAN) injection 4 mg (has no administration in time range)  glycopyrrolate (ROBINUL) tablet 1 mg ( Oral See Alternative 09/26/2021  1322)    Or  glycopyrrolate (ROBINUL) injection 0.2 mg ( Subcutaneous See Alternative 10/04/2021 1322)    Or  glycopyrrolate (ROBINUL) injection 0.2 mg (0.2 mg Intravenous Given 09/25/2021 1322)  antiseptic oral rinse (BIOTENE) solution 15 mL (has no administration in time range)  nystatin (MYCOSTATIN/NYSTOP) topical powder (has no administration in time range)  polyvinyl alcohol (LIQUIFILM TEARS) 1.4 % ophthalmic solution 1 drop (has no administration in time range)    ED Course/ Medical Decision Making/ A&P                           Medical Decision Making Amount and/or Complexity of Data Reviewed External Data Reviewed: notes.    Details: Previous admission notes and goals of care paperwork. Labs: ordered. Radiology: ordered.  Risk Decision regarding hospitalization. Decision not to resuscitate or to de-escalate care because of poor prognosis.   Patient with history of [name] cirrhosis.  Reviewed previous goals of care.  Does not want intubation or resuscitation.  Discussed with patient's son Lissette Schenk.  Confirmed her wishes.  Patient does not have a gag reflex and otherwise will be intubated.  However oxygenating okay at this time.  CBG okay here.  Blood pressure anything a little elevated and appears to be on midodrine.  Chest x-ray independently interpreted and showed pleural effusion on left.  Will discuss with hospitalist for admission.  Will need further goals of care discussion.        Final Clinical Impression(s) / ED Diagnoses Final diagnoses:  Unresponsiveness  Palliative care status    Rx / DC Orders ED Discharge Orders     None         Davonna Belling, MD 10/09/2021 [phone]

## 2021-09-15 NOTE — ED Notes (Signed)
MD notified of [place]  ?

## 2021-09-15 NOTE — H&P (Addendum)
History and Physical  [facility]  [name] SJG:[id number] DOB: Nov 11, 1940 DOA: 09/20/2021  PCP: Wannetta Sender, FNP  Patient coming from: Home by [facility] Level of care: Med-Surg  I have personally briefly reviewed patient's old medical records in [place]  Chief Complaint: unresponsiveness  HPI: Cheryl Jordan is a 81 year old female with decompensated Cheryl Jordan cirrhosis with multiple recent hospitalizations, history of hepatic encephalopathy, colon cancer, stage IIIb CKD, type 2 diabetes mellitus, severe protein calorie malnutrition, recent SARS COVID infection and recent C. difficile infection, recently declined TIPS procedure, missed home hospice appointment recently and it was rescheduled for 3/22.  Patient was brought in by [facility] from home with unresponsiveness.  Her CBG was down to 50.  She was given D50 by EMS with improvement of BS to 99 however subsequently dropped again to 50 and required an additional D50 infusion.  Patient arrived unresponsive with acute severe respiratory distress.  Patient is DNR/DNI.  Patient appears terminal and labs revealed elevated ammonia at 52, potassium 5.2, creatinine 3.42, bilirubin 1.3, lactic acid 2.0, WBC 11.3, hemoglobin 10.8.  Chest x-ray with findings of significantly worsening although moderate left pleural effusion with atelectasis of the lungs.  After discussion with patient's son he wanted to honor patient's DNR and no extraordinary measures and requested patient be kept comfortable as her prognosis remains terminal and likely could expire in hours.  Patient is being admitted for initiation of full comfort measures.  Prognosis is terminal.  Pt is expected to die in hospital presumably within hours.     Review of Systems: Review of Systems  Unable to perform ROS: Patient unresponsive    Past Medical History:  Diagnosis Date   Allergy    Anemia    hx of   Anxiety    Aortic stenosis    mild by 04/08/20 echo   Arthritis     Colon cancer ([place]) dx'd 11/2014   Diabetes mellitus without complication (HCC)    TYPE II   Dyspnea    Fatty liver    Gallbladder disease    GERD (gastroesophageal reflux disease)    Heart murmur    History of gout    Hypertension    Hypothyroidism    Kidney disorder    spot on left kidney   Osteoporosis    PONV (postoperative nausea and vomiting)     Past Surgical History:  Procedure Laterality Date   BIOPSY  06/17/2020   Procedure: BIOPSY;  Surgeon: Irving Copas., MD;  Location: [facility];  Service: Gastroenterology;;   CATARACT EXTRACTION Left    CHOLECYSTECTOMY     COLONOSCOPY WITH PROPOFOL N/A 06/17/2020   Procedure: COLONOSCOPY WITH PROPOFOL;  Surgeon: Irving Copas., MD;  Location: [facility];  Service: Gastroenterology;  Laterality: N/A;   COLONOSCOPY WITH PROPOFOL N/A 07/01/2020   Procedure: COLONOSCOPY WITH PROPOFOL;  Surgeon: Mauri Pole, MD;  Location: [facility];  Service: Endoscopy;  Laterality: N/A;   ESOPHAGOGASTRODUODENOSCOPY (EGD) WITH PROPOFOL N/A 06/17/2020   Procedure: ESOPHAGOGASTRODUODENOSCOPY (EGD) WITH PROPOFOL;  Surgeon: Rush Landmark Telford Nab., MD;  Location: [facility];  Service: Gastroenterology;  Laterality: N/A;   ESOPHAGOGASTRODUODENOSCOPY (EGD) WITH PROPOFOL N/A 07/01/2020   Procedure: ESOPHAGOGASTRODUODENOSCOPY (EGD) WITH PROPOFOL;  Surgeon: Mauri Pole, MD;  [facility]: [facility];  Service: Endoscopy;  Laterality: N/A;   ESOPHAGOGASTRODUODENOSCOPY (EGD) WITH PROPOFOL N/A 08/10/2021   Procedure: ESOPHAGOGASTRODUODENOSCOPY (EGD) WITH PROPOFOL;  Surgeon: Daryel November, MD;  Location: [facility];  Service: Gastroenterology;  Laterality: N/A;   FOREIGN BODY REMOVAL  06/17/2020   Procedure: FOREIGN BODY REMOVAL;  Surgeon: Rush Landmark Telford Nab., MD;  Location: [facility];  Service: Gastroenterology;;   GI RADIOFREQUENCY ABLATION  06/17/2020   Procedure: GI RADIOFREQUENCY ABLATION;  Surgeon:  Irving Copas., MD;  Location: [facility];  Service: Gastroenterology;;   HEMOSTASIS CLIP PLACEMENT  06/17/2020   Procedure: HEMOSTASIS CLIP PLACEMENT;  Surgeon: Irving Copas., MD;  Location: [facility];  Service: Gastroenterology;;   HEMOSTASIS CLIP PLACEMENT  07/01/2020   Procedure: HEMOSTASIS CLIP PLACEMENT;  Surgeon: Mauri Pole, MD;  [facility]: [facility];  Service: Endoscopy;;   HOT HEMOSTASIS N/A 07/01/2020   Procedure: HOT HEMOSTASIS (ARGON PLASMA COAGULATION/BICAP);  Surgeon: Mauri Pole, MD;  Location: [facility];  Service: Endoscopy;  Laterality: N/A;   HOT HEMOSTASIS  08/10/2021   Procedure: HOT HEMOSTASIS (ARGON PLASMA COAGULATION/BICAP);  Surgeon: Daryel November, MD;  Location: [facility];  Service: Gastroenterology;;   IR PARACENTESIS  08/19/2020   IR PARACENTESIS  09/26/2020   IR PARACENTESIS  11/03/2020   IR PARACENTESIS  11/28/2020   IR PARACENTESIS  12/21/2020   IR PARACENTESIS  01/17/2021   IR PARACENTESIS  02/08/2021   IR PARACENTESIS  02/23/2021   IR PARACENTESIS  03/22/2021   IR PARACENTESIS  04/06/2021   IR PARACENTESIS  04/20/2021   IR PARACENTESIS  05/03/2021   IR PARACENTESIS  05/16/2021   IR PARACENTESIS  05/29/2021   IR PARACENTESIS  06/07/2021   IR PARACENTESIS  06/15/2021   IR PARACENTESIS  06/28/2021   IR PARACENTESIS  07/05/2021   IR PARACENTESIS  07/14/2021   IR PARACENTESIS  07/21/2021   IR PARACENTESIS  08/01/2021   IR RADIOLOGIST EVAL & MGMT  01/24/2017   IR RADIOLOGIST EVAL & MGMT  02/22/2021   IR RADIOLOGIST EVAL & MGMT  03/28/2021   IR RADIOLOGIST EVAL & MGMT  06/06/2021   IR RADIOLOGIST EVAL & MGMT  08/29/2021   KNEE CARTILAGE SURGERY Bilateral    NEPHRECTOMY Left 02/10/2015   Procedure: OPEN RETROPERINTONEAL EXPLORATION LEFT RENAL  CYST DECORTICATION X 5;  Surgeon: Cleon Gustin, MD;  Location: [facility];  Service: Urology;  Laterality: Left;   PARTIAL COLECTOMY N/A 02/10/2015   Procedure: OPEN  RIGHT  COLECTOMY ;  Surgeon: [name], MD;  Location: [facility];  Service: General;  Laterality: N/A;   POLYPECTOMY  06/17/2020   Procedure: POLYPECTOMY;  Surgeon: Irving Copas., MD;  Location: [facility];  Service: Gastroenterology;;   RADIOLOGY WITH ANESTHESIA N/A 03/08/2021   Procedure: CT MICROWAVE ABLATION WITH ANESTHESIA;  Surgeon: Arne Cleveland, MD;  Location: [facility];  Service: Radiology;  Laterality: N/A;   TUBAL LIGATION       reports that she has never smoked. She has never used smokeless tobacco. She reports that she does not drink alcohol and does not use drugs.  Allergies  Allergen Reactions   Aspirin Other (See Comments)    Runny nose    Codeine Nausea And Vomiting   Tape Other (See Comments)    Unnamed "medical tape" BLISTERS THE SKIN!!    Family History  Problem Relation Age of Onset   Diabetes Mother    Hypertension Other    COPD Father    Hypertension Sister    GER disease Sister    Heart attack Brother    COPD Brother    Lung cancer Brother    COPD Brother    Hypertension Brother     Prior to  Admission medications   Medication Sig Start Date End Date Taking? Authorizing Provider  glipiZIDE (GLUCOTROL XL) 5 MG 24 hr tablet Take 5 mg by mouth daily with breakfast.    [provider]  lactose free nutrition (BOOST PLUS) LIQD Take 237 mLs by mouth 3 (three) times daily with meals. Patient not taking: Reported on 08/19/2021 08/11/21   Lavina Hamman, MD  levothyroxine (SYNTHROID) 88 MCG tablet Take 1 tablet (88 mcg total) by mouth daily. 12/04/19   Brunetta Jeans, PA-C  loperamide (IMODIUM) 2 MG capsule Take 2 mg by mouth as needed for diarrhea or loose stools.    [provider]  LORazepam (ATIVAN) 0.5 MG tablet Take 0.5 mg by mouth 2 (two) times daily as needed for anxiety.    [provider]  midodrine (PROAMATINE) 5 MG tablet Take 1 tablet (5 mg total) by mouth 3 (three) times daily with meals. 09/09/21   Roxan Hockey, MD  Multiple Vitamin (MULTIVITAMIN WITH MINERALS) TABS tablet Take 1 tablet by mouth daily. 09/09/21   Roxan Hockey, MD  ondansetron (ZOFRAN) 4 MG tablet Take 1 tablet (4 mg total) by mouth every 8 (eight) hours as needed for nausea or vomiting. 08/24/21 08/24/22  Rai, Vernelle Emerald, MD  pantoprazole (PROTONIX) 40 MG tablet Take 1 tablet (40 mg total) by mouth 2 (two) times daily before a meal. 09/08/21   Emokpae, Courage, MD  propranolol (INDERAL) 20 MG tablet Take 1 tablet (20 mg total) by mouth 2 (two) times daily. 09/08/21   Roxan Hockey, MD  saccharomyces boulardii (FLORASTOR) 250 MG capsule Take 1 capsule (250 mg total) by mouth 2 (two) times daily for 10 days. 09/08/21 09/18/21  Roxan Hockey, MD  Simethicone (GAS-X PO) Take 1 tablet by mouth as needed (flatulence).    [provider]  sodium bicarbonate 650 MG tablet Take 1 tablet (650 mg total) by mouth 2 (two) times daily. 09/08/21   Roxan Hockey, MD  sucralfate (CARAFATE) 1 g tablet Take 1 tablet (1 g total) by mouth 2 (two) times daily. 09/08/21   Roxan Hockey, MD  vancomycin (VANCOCIN) 125 MG capsule Take 1 capsule (125 mg total) by mouth 4 (four) times daily for 8 days. 09/09/21 09/17/21  Roxan Hockey, MD    Physical Exam: Vitals:   10/10/2021 1100 10/07/2021 1130 10/01/2021 1200 10/11/2021 1230  BP:   (!) 133/59 (!) 125/59  Pulse: (!) 56 (!) 55 (!) 54 (!) 53  Resp: (!) 25 (!) 26 (!) 25 (!) 25  Temp:      TempSrc:      SpO2: 98% 100% 100% 100%  Weight:      Height:       Constitutional: patient appears terminally ill mouth open gasping for breath Eyes: scleral icterus seen.  ENMT: Mucous membranes are severely dry.  Neck: normal, supple, no masses, no thyromegaly Respiratory: shallow breathing diminished BS LLL  Cardiovascular: normal s1, s2 sounds.   Abdomen: distended, with fluid wave.   Musculoskeletal: emaciated condition.  Skin: mildly jaundiced.  Neurologic: unresponsive appears terminal.    Psychiatric: unresponsive. Appears terminal.   Labs on Admission: I have personally reviewed following labs and imaging studies  CBC: Recent Labs  Lab 09/09/21 0504 10/02/2021 1033  WBC 4.2 11.3*  NEUTROABS  --  9.2*  HGB 9.3* 10.8*  HCT 30.8* 34.5*  MCV 92.2 91.5  PLT 82* 379   Basic Metabolic Panel: Recent Labs  Lab 09/09/21 0504 09/28/2021 1033  NA 139 134*  K 4.5 5.2*  CL 111 103  CO2 20* 21*  GLUCOSE 117* 73  BUN 51* 61*  CREATININE 3.70* 3.42*  CALCIUM 9.2 8.7*   GFR: Estimated Creatinine Clearance: 14.4 mL/min (A) (by C-G formula based on SCr of 3.42 mg/dL (H)). Liver Function Tests: Recent Labs  Lab 09/09/21 0504 10/13/2021 1033  AST 23 47*  ALT 10 20  ALKPHOS 69 127*  BILITOT 1.3* 1.3*  PROT 5.5* 6.7  ALBUMIN 3.6 3.7   No results for input(s): LIPASE, AMYLASE in the last 168 hours. Recent Labs  Lab 10/11/2021 1109  AMMONIA 52*   Coagulation Profile: Recent Labs  Lab 09/09/21 0504 09/24/2021 1033  INR 1.4* 1.2   Cardiac Enzymes: No results for input(s): CKTOTAL, CKMB, CKMBINDEX, TROPONINI in the last 168 hours. BNP (last 3 results) No results for input(s): PROBNP in the last 8760 hours. HbA1C: No results for input(s): HGBA1C in the last 72 hours. CBG: Recent Labs  Lab 10/13/2021 1035  GLUCAP 91   Lipid Profile: No results for input(s): CHOL, HDL, LDLCALC, TRIG, CHOLHDL, LDLDIRECT in the last 72 hours. Thyroid Function Tests: No results for input(s): TSH, T4TOTAL, FREET4, T3FREE, THYROIDAB in the last 72 hours. Anemia Panel: No results for input(s): VITAMINB12, FOLATE, FERRITIN, TIBC, IRON, RETICCTPCT in the last 72 hours. Urine analysis:    Component Value Date/Time   COLORURINE YELLOW 09/06/2021 0200   APPEARANCEUR CLEAR 09/06/2021 0200   LABSPEC 1.013 09/06/2021 0200   PHURINE 5.0 09/06/2021 0200   GLUCOSEU NEGATIVE 09/06/2021 0200   HGBUR SMALL (A) 09/06/2021 0200   BILIRUBINUR NEGATIVE 09/06/2021 0200   BILIRUBINUR Negative  12/04/2019 1352   KETONESUR NEGATIVE 09/06/2021 0200   PROTEINUR NEGATIVE 09/06/2021 0200   UROBILINOGEN 0.2 12/04/2019 1352   UROBILINOGEN 0.2 02/07/2011 1832   NITRITE NEGATIVE 09/06/2021 0200   LEUKOCYTESUR TRACE (A) 09/06/2021 0200    Radiological Exams on Admission: DG Chest Portable 1 View  Result Date: 09/25/2021 CLINICAL DATA:  Pt brought in by [facility] from home with c/o unresponsiveness. EMS reports CBG was 50, D50 was given with increase of BG to 99. BG then dropped to 50 again and D50 given again. Pt arrives to ED unresponsive. LKW last night when she went to bed, unknown time. EXAM: PORTABLE CHEST 1 VIEW COMPARISON:  Prior exams, most recent dated 08/18/2021. FINDINGS: Moderate left pleural effusion obscures the hemidiaphragm and portions of the left heart border, increased compared to the prior chest radiograph. There is associated opacity in the left perihilar and lower lung that is likely a combination of posterior layering pleural fluid and atelectasis. Right lung is clear.  No right pleural effusion. No pneumothorax. Cardiac silhouette is top-normal in size. No mediastinal or hilar masses. Skeletal structures are demineralized, but grossly intact. IMPRESSION: 1. Moderate left pleural effusion, significantly increased when compared to the prior chest radiograph, associated with dependent lung opacity that is most likely atelectasis. Pneumonia not excluded. No convincing pulmonary edema. Electronically Signed   By: Lajean Manes M.D.   On: 09/21/2021 11:15    Assessment/Plan Principal Problem:   Comfort measures only status Active Problems:   Adenocarcinoma of colon ([place])   Protein-calorie malnutrition, severe   Decompensated Nash cirrhosis ([place])   Unresponsiveness   DNR (do not resuscitate)   Hepatic encephalopathy   Acute respiratory distress   Recurrent Moderate left pleural effusion   Hyperkalemia   Hyperammonemia (HCC)   Lactic acidosis   Leukocytosis   Chronic kidney  disease (CKD), stage IV (severe) (  HCC)   Anemia, chronic disease   GERD (gastroesophageal reflux disease)   Assessment and Plan: * Comfort measures only status Full comfort care orderset bundle utilized Unrestricted visitation for family  Anticipate patient has minutes to hours.   Chronic kidney disease (CKD), stage IV (severe) (HCC) Pt will begin full comfort measures  Anticipate patient has minutes to hours.   Leukocytosis Pt will begin full comfort measures  Anticipate patient has minutes to hours.   Lactic acidosis Pt will begin full comfort measures  Anticipate patient has minutes to hours.   Hyperammonemia (HCC) Pt will begin full comfort measures  Anticipate patient has minutes to hours.   Hyperkalemia Pt will begin full comfort measures  Anticipate patient has minutes to hours.   Recurrent Moderate left pleural effusion IV morphine ordered for comfort and respiratory distress.  Acute respiratory distress IV morphine ordered for comfort and respiratory distress.   Hepatic encephalopathy Pt will begin full comfort measures  Anticipate patient has minutes to hours.   DNR (do not resuscitate) Full comfort measures  Unresponsiveness Pt will begin full comfort measures  Anticipate patient has minutes to hours.   Decompensated Nash cirrhosis ([place]) Pt will begin full comfort measures  Anticipate patient has minutes to hours.   Protein-calorie malnutrition, severe Pt will begin full comfort measures  Anticipate patient has minutes to hours.   Adenocarcinoma of colon ([place]) Pt will begin full comfort measures  Anticipate patient has minutes to hours.   Anemia, chronic disease Pt will begin full comfort measures  Anticipate patient has minutes to hours.    DVT prophylaxis: FULL COMFORT CARE  Code Status: DNR   Family Communication: son, husband updated with plan of care 3/3 Disposition Plan: anticipate hospital death   Consults called: Chaplain  (palliative care not available)   Admission status: INP  Level of care: Med-Surg Irwin Brakeman MD [facility] How to contact the [facility] Attending or Consulting provider [place] or covering provider during after hours [place], for this patient?  Check the care team in [facility] and look for a) attending/consulting [facility] provider listed and b) the [facility] team listed Log into [email or web address] and use [place]'s universal password to access. If you do not have the password, please contact the hospital operator. Locate the [facility] provider you are looking for under [facility] and page to a number that you can be directly reached. If you still have difficulty reaching the provider, please page the [facility] (Director on Call) for the Hospitalists listed on amion for assistance.   If 7PM-7AM, please contact night-coverage [email or web address] Password [facility]  09/14/2021, 12:39 PM

## 2021-09-15 NOTE — ED Triage Notes (Signed)
Pt brought in by [facility] from home with c/o unresponsiveness. EMS reports CBG was 50, D50 was given with increase of BG to 99. BG then dropped to 50 again and D50 given again. Pt arrives to ED unresponsive. LKW last night when she went to bed, unknown time.  ?

## 2021-09-16 DIAGNOSIS — C189 Malignant neoplasm of colon, unspecified: Secondary | ICD-10-CM | POA: Diagnosis not present

## 2021-09-16 DIAGNOSIS — R0603 Acute respiratory distress: Secondary | ICD-10-CM | POA: Diagnosis not present

## 2021-09-16 DIAGNOSIS — N184 Chronic kidney disease, stage 4 (severe): Secondary | ICD-10-CM | POA: Diagnosis not present

## 2021-09-16 DIAGNOSIS — Z515 Encounter for palliative care: Secondary | ICD-10-CM | POA: Diagnosis not present

## 2021-09-18 ENCOUNTER — Ambulatory Visit: Payer: Medicare Other | Admitting: Internal Medicine

## 2021-10-04 ENCOUNTER — Other Ambulatory Visit: Payer: Self-pay | Admitting: Family Medicine

## 2021-10-14 NOTE — Progress Notes (Signed)
0935: Called to room by family, states thinks pt has passed. Pt assessed with no auscultated heart sounds, no respirations for one minute. Second verification performed by R. Hall Busing, RN with confirmation of death. Confirmation of passing communicated to family member at bedside. Comfort offered to family member, she advises that additional family members, including pt's husband, are on their way to the hospital now. Pt cleaned, IV's, purewick and o2 removed and body readied for family viewing. ?1000: MD Wynetta Emery notified of pt death. Donor Services notified. Pt's husband, son and nephew have arrived. Escorted to room, sympathy offered. Advised to call for needs. Son states 3 grandchildren are on their way to hospital as well. ?1020: Additional family members to room. Son and husband state no needs at this time. ?1100: Family states they are leaving. Requesting [employer] home in [place]. ?1115: Body preparations completed for transport to morgue. Bed placement updated and security called to transport. ?1200: Security transported pt to morgue via Biomedical [employer]. ? ?

## 2021-10-14 NOTE — Death Summary Note (Signed)
DEATH SUMMARY   Patient Details  Name: Cheryl Jordan MRN: [id number] DOB: [birth year]  Admission/Discharge Information   Admit Date:  01-Oct-2021  Date of Death: Date of Death: Oct 02, 2021  Time of Death: Time of Death: 0935  Length of Stay: 1  Referring Physician: Wannetta Sender, FNP   Reason(s) for Hospitalization  HPI: Cheryl Jordan is a 81 year old female with decompensated [name] cirrhosis with multiple recent hospitalizations, history of hepatic encephalopathy, colon cancer, stage IIIb CKD, type 2 diabetes mellitus, severe protein calorie malnutrition, recent SARS COVID infection and recent C. difficile infection, recently declined TIPS procedure, missed home hospice appointment recently and it was rescheduled for 3/22.  Patient was brought in by [facility] from home with unresponsiveness.  Her CBG was down to 50.  She was given D50 by EMS with improvement of BS to 99 however subsequently dropped again to 50 and required an additional D50 infusion.  Patient arrived unresponsive with acute severe respiratory distress.  Patient is DNR/DNI.  Patient appears terminal and labs revealed elevated ammonia at 52, potassium 5.2, creatinine 3.42, bilirubin 1.3, lactic acid 2.0, WBC 11.3, hemoglobin 10.8.  Chest x-ray with findings of significantly worsening although moderate left pleural effusion with atelectasis of the lungs.  After discussion with patient's son he wanted to honor patient's DNR and no extraordinary measures and requested patient be kept comfortable as her prognosis remains terminal and likely could expire in hours.  Patient is being admitted for initiation of full comfort measures.  Prognosis is terminal.  Pt is expected to die in hospital presumably within hours.    Diagnoses  Preliminary cause of death:  Secondary Diagnoses (including complications and co-morbidities):  Principal Problem:   Comfort measures only status Active Problems:   Adenocarcinoma of colon ([place])   Protein-calorie  malnutrition, severe   Decompensated Nash cirrhosis ([place])   Unresponsiveness   DNR (do not resuscitate)   Hepatic encephalopathy   Acute respiratory distress   Recurrent Moderate left pleural effusion   Hyperkalemia   Hyperammonemia (HCC)   Lactic acidosis   Leukocytosis   Chronic kidney disease (CKD), stage IV (severe) (HCC)   Anemia, chronic disease   GERD (gastroesophageal reflux disease)   Brief Hospital Course (including significant findings, care, treatment, and services provided and events leading to death)  Assessment and Plan: * Comfort measures only status Full comfort care orderset bundle utilized Unrestricted visitation for family  Anticipate patient has minutes to hours.   Chronic kidney disease (CKD), stage IV (severe) (HCC) Pt will begin full comfort measures  Anticipate patient has minutes to hours.   Leukocytosis Pt will begin full comfort measures  Anticipate patient has minutes to hours.   Lactic acidosis Pt will begin full comfort measures  Anticipate patient has minutes to hours.   Hyperammonemia (HCC) Pt will begin full comfort measures  Anticipate patient has minutes to hours.   Hyperkalemia Pt will begin full comfort measures  Anticipate patient has minutes to hours.   Recurrent Moderate left pleural effusion IV morphine ordered for comfort and respiratory distress.  Acute respiratory distress IV morphine ordered for comfort and respiratory distress.   Hepatic encephalopathy Pt will begin full comfort measures  Anticipate patient has minutes to hours.   DNR (do not resuscitate) Full comfort measures  Unresponsiveness Pt will begin full comfort measures  Anticipate patient has minutes to hours.   Decompensated Nash cirrhosis ([place]) Pt will begin full comfort measures  Anticipate patient has minutes to hours.  Protein-calorie malnutrition, severe Pt will begin full comfort measures  Anticipate patient has minutes to hours.    Adenocarcinoma of colon ([place]) Pt will begin full comfort measures  Anticipate patient has minutes to hours.   Anemia, chronic disease Pt will begin full comfort measures  Anticipate patient has minutes to hours.    Pertinent Labs and Studies  Significant Diagnostic Studies DG Chest 2 View  Result Date: 08/18/2021 CLINICAL DATA:  Weakness and loss of appetite. EXAM: CHEST - 2 VIEW COMPARISON:  earlier today FINDINGS: The heart size and mediastinal contours are within normal limits. The lung volumes are low. There is no pleural effusion or edema. No airspace opacities. The visualized skeletal structures are unremarkable. IMPRESSION: Low lung volumes.  No acute findings. Electronically Signed   By: Kerby Moors M.D.   On: 08/18/2021 18:55   US RENAL  Result Date: 09/08/2021 CLINICAL DATA:  Renal dysfunction EXAM: RENAL / URINARY TRACT ULTRASOUND COMPLETE COMPARISON:  CT abdomen done on 08/08/2021 FINDINGS: Right Kidney: Renal measurements: 10.2 x 4.6 x 4.3 cm = volume: 103.3 mL. There is no hydronephrosis. There is increased cortical echogenicity. Left Kidney: Renal measurements: 9.2 x 4.6 x 3.9 cm = volume: 86.3 mL. There is no hydronephrosis. There is increased cortical echogenicity. There is 2.8 x 2.5 cm cyst in the lateral margin of left kidney. Bladder: Urinary bladder is not distended and not adequately evaluated Other: Small ascites is present. There is nodularity in the liver surface suggesting possible cirrhosis. IMPRESSION: There is no hydronephrosis. There is increased cortical echogenicity suggesting medical renal disease. Left renal cyst. Ascites.  Cirrhosis of liver. Electronically Signed   By: Elmer Picker M.D.   On: 09/08/2021 13:44   US Paracentesis  Result Date: 09/04/2021 INDICATION: Cirrhosis, ascites EXAM: ULTRASOUND GUIDED DIAGNOSTIC AND THERAPEUTIC PARACENTESIS MEDICATIONS: None. COMPLICATIONS: None immediate. PROCEDURE: Informed written consent was obtained from  the patient after a discussion of the risks, benefits and alternatives to treatment. A timeout was performed prior to the initiation of the procedure. Initial ultrasound scanning demonstrates a large amount of ascites within the right lower abdominal quadrant. The right lower abdomen was prepped and draped in the usual sterile fashion. 1% lidocaine was used for local anesthesia. Following this, a 5 French catheter was introduced. An ultrasound image was saved for documentation purposes. The paracentesis was performed. The catheter was removed and a dressing was applied. The patient tolerated the procedure well without immediate post procedural complication. Patient received post-procedure intravenous albumin; see nursing notes for details. FINDINGS: A total of approximately 6 L of yellow ascitic fluid was removed. Samples were sent to the laboratory as requested by the clinical team. IMPRESSION: Successful ultrasound-guided paracentesis yielding 6 liters of peritoneal fluid. Electronically Signed   By: Lavonia Dana M.D.   On: 09/04/2021 13:24   US Paracentesis  Result Date: 08/21/2021 INDICATION: Patient with a history of cirrhosis, hepatocellular carcinoma and recurrent large volume ascites presents today for therapeutic paracentesis with 6 L max. EXAM: ULTRASOUND GUIDED PARACENTESIS MEDICATIONS: 1% lidocaine 10 mL COMPLICATIONS: None immediate. PROCEDURE: Informed written consent was obtained from the patient after a discussion of the risks, benefits and alternatives to treatment. A timeout was performed prior to the initiation of the procedure. Initial ultrasound scanning demonstrates a large amount of ascites within the left lower abdominal quadrant. The left lower abdomen was prepped and draped in the usual sterile fashion. 1% lidocaine was used for local anesthesia. Following this, a 19 gauge, 7-cm, Yueh catheter was  introduced. An ultrasound image was saved for documentation purposes. The paracentesis was  performed. The catheter was removed and a dressing was applied. The patient tolerated the procedure well without immediate post procedural complication. Patient received post-procedure intravenous albumin; see nursing notes for details. FINDINGS: A total of approximately 6 L of clear yellow fluid was removed. IMPRESSION: Successful ultrasound-guided paracentesis yielding 6 liters of peritoneal fluid. Read by: Soyla Dryer, NP Electronically Signed   By: Jerilynn Mages.  Shick M.D.   On: 08/21/2021 15:26   DG Chest Portable 1 View  Result Date: 10/11/2021 CLINICAL DATA:  Pt brought in by [facility] from home with c/o unresponsiveness. EMS reports CBG was 50, D50 was given with increase of BG to 99. BG then dropped to 50 again and D50 given again. Pt arrives to ED unresponsive. LKW last night when she went to bed, unknown time. EXAM: PORTABLE CHEST 1 VIEW COMPARISON:  Prior exams, most recent dated 08/18/2021. FINDINGS: Moderate left pleural effusion obscures the hemidiaphragm and portions of the left heart border, increased compared to the prior chest radiograph. There is associated opacity in the left perihilar and lower lung that is likely a combination of posterior layering pleural fluid and atelectasis. Right lung is clear.  No right pleural effusion. No pneumothorax. Cardiac silhouette is top-normal in size. No mediastinal or hilar masses. Skeletal structures are demineralized, but grossly intact. IMPRESSION: 1. Moderate left pleural effusion, significantly increased when compared to the prior chest radiograph, associated with dependent lung opacity that is most likely atelectasis. Pneumonia not excluded. No convincing pulmonary edema. Electronically Signed   By: Lajean Manes M.D.   On: 10/12/2021 11:15   DG Chest Port 1 View  Result Date: 08/18/2021 CLINICAL DATA:  Generalized weakness EXAM: PORTABLE CHEST 1 VIEW COMPARISON:  03/02/2021 FINDINGS: Mild left basilar atelectasis. No focal consolidation. No pleural  effusion or pneumothorax. Heart and mediastinal contours are unremarkable. No acute osseous abnormality. IMPRESSION: No active disease. Electronically Signed   By: Kathreen Devoid M.D.   On: 08/18/2021 18:34   US LIVER DOPPLER  Result Date: 08/19/2021 CLINICAL DATA:  Cirrhosis EXAM: DUPLEX ULTRASOUND OF LIVER TECHNIQUE: Color and duplex Doppler ultrasound was performed to evaluate the hepatic in-flow and out-flow vessels. COMPARISON:  MRI abdomen 08/11/2021 FINDINGS: Liver: Cirrhotic liver morphology. 2.9 cm echogenic mass noted in the right liver lobe corresponds to the ablated lesion seen on prior MRI. Main Portal Vein size: 1.5 cm Portal Vein Velocities Main Prox:  15 cm/sec Main Mid: 10 cm/sec Main Dist:  16 cm/sec Right: 9 cm/sec Left: 13 cm/sec Hepatic Vein Velocities Right:  15 cm/sec Middle:  28 cm/sec Left:  19 cm/sec IVC: Present and patent with normal respiratory phasicity. Hepatic Artery Velocity:  72 cm/sec Splenic Vein Velocity:  20 cm/sec Spleen: 13 cm x 9 cm x 12 cm with a total volume of 755 cm^3 (411 cm^3 is upper limit normal) Portal Vein Occlusion/Thrombus: No Splenic Vein Occlusion/Thrombus: No Ascites: Mild abdominal ascites. Varices: None Incidental note made of left renal cyst measuring 6.6 cm and the left pleural effusion. IMPRESSION: 1. Patent portal vein with appropriate direction of flow. 2. Splenomegaly 3. Mild ascites 4. Cirrhotic liver morphology Electronically Signed   By: Miachel Roux M.D.   On: 08/19/2021 08:18   IR Radiologist Eval & Mgmt  Result Date: 08/29/2021 Please refer to notes tab for details about interventional procedure. (Op Note)   Microbiology Recent Results (from the past 240 hour(s))  Resp Panel by RT-PCR (Flu A&B, Covid)  Nasopharyngeal Swab     Status: Abnormal   Collection Time: 10/08/2021  3:07 PM   Specimen: Nasopharyngeal Swab; Nasopharyngeal(NP) swabs in vial transport medium  Result Value Ref Range Status   SARS Coronavirus 2 by RT PCR POSITIVE (A)  NEGATIVE Final    Comment: (NOTE) SARS-CoV-2 target nucleic acids are DETECTED.  The SARS-CoV-2 RNA is generally detectable in upper respiratory specimens during the acute phase of infection. Positive results are indicative of the presence of the identified virus, but do not rule out bacterial infection or co-infection with other pathogens not detected by the test. Clinical correlation with patient history and other diagnostic information is necessary to determine patient infection status. The expected result is Negative.  Fact Sheet for Patients: EntrepreneurPulse.com.au  Fact Sheet for Healthcare Providers: IncredibleEmployment.be  This test is not yet approved or cleared by the [place] FDA and  has been authorized for detection and/or diagnosis of SARS-CoV-2 by FDA under an Emergency Use Authorization (EUA).  This EUA will remain in effect (meaning this test can be used) for the duration of  the COVID-19 declaration under Section 564(b)(1) of the A ct, 21 U.S.C. section 360bbb-3(b)(1), unless the authorization is terminated or revoked sooner.     Influenza A by PCR NEGATIVE NEGATIVE Final   Influenza B by PCR NEGATIVE NEGATIVE Final    Comment: (NOTE) The Xpert Xpress SARS-CoV-2/FLU/RSV plus assay is intended as an aid in the diagnosis of influenza from Nasopharyngeal swab specimens and should not be used as a sole basis for treatment. Nasal washings and aspirates are unacceptable for Xpert Xpress SARS-CoV-2/FLU/RSV testing.  Fact Sheet for Patients: EntrepreneurPulse.com.au  Fact Sheet for Healthcare Providers: IncredibleEmployment.be  This test is not yet approved or cleared by the [place] FDA and has been authorized for detection and/or diagnosis of SARS-CoV-2 by FDA under an Emergency Use Authorization (EUA). This EUA will remain in effect (meaning this test can be used) for the  duration of the COVID-19 declaration under Section 564(b)(1) of the Act, 21 U.S.C. section 360bbb-3(b)(1), unless the authorization is terminated or revoked.  Performed at [facility], [address] [place], [place] [address]     Lab Basic Metabolic Panel: Recent Labs  Lab 10/05/2021 1033  NA 134*  K 5.2*  CL 103  CO2 21*  GLUCOSE 73  BUN 61*  CREATININE 3.42*  CALCIUM 8.7*   Liver Function Tests: Recent Labs  Lab 09/22/2021 1033  AST 47*  ALT 20  ALKPHOS 127*  BILITOT 1.3*  PROT 6.7  ALBUMIN 3.7   No results for input(s): LIPASE, AMYLASE in the last 168 hours. Recent Labs  Lab 10/10/2021 1109  AMMONIA 52*   CBC: Recent Labs  Lab 09/30/2021 1033  WBC 11.3*  NEUTROABS 9.2*  HGB 10.8*  HCT 34.5*  MCV 91.5  PLT 161   Cardiac Enzymes: No results for input(s): CKTOTAL, CKMB, CKMBINDEX, TROPONINI in the last 168 hours. Sepsis Labs: Recent Labs  Lab 09/28/2021 1033  WBC 11.3*  LATICACIDVEN 2.0*    Procedures/Operations   Yuette Putnam MD  2021-10-06, 1:06 PM

## 2021-10-14 NOTE — Progress Notes (Signed)
Pt assessment completed. Pt remains unresponsive, respirations shallow @ 16-20/min cheyne stokes pattern. Breath sounds gurgling. Robinul administered per order. HR 96/min apically. Toes and fingertips cyanotic. Morphine drip continues per order, IV site with no s/s infiltration. Family members at bedside, updated on current conditions, discussed process of dying and s/s to be aware of and what to report to staff. Both family members state understanding. ?

## 2021-10-14 DEATH — deceased

## 2022-01-14 IMAGING — US IR PARACENTESIS
1 series · 3 of 3 positions shown · non-contrast
Comparison: none

INDICATION: Patient with history of colon cancer, anemia, left renal mass,
cirrhosis with recurrent ascites. Request to IR for diagnostic and
therapeutic paracentesis with 5 L maximum.

[Series 1: ir (id) (id)/(id)/(id) ir · 3 of 3 slices shown]
[im 1/3]
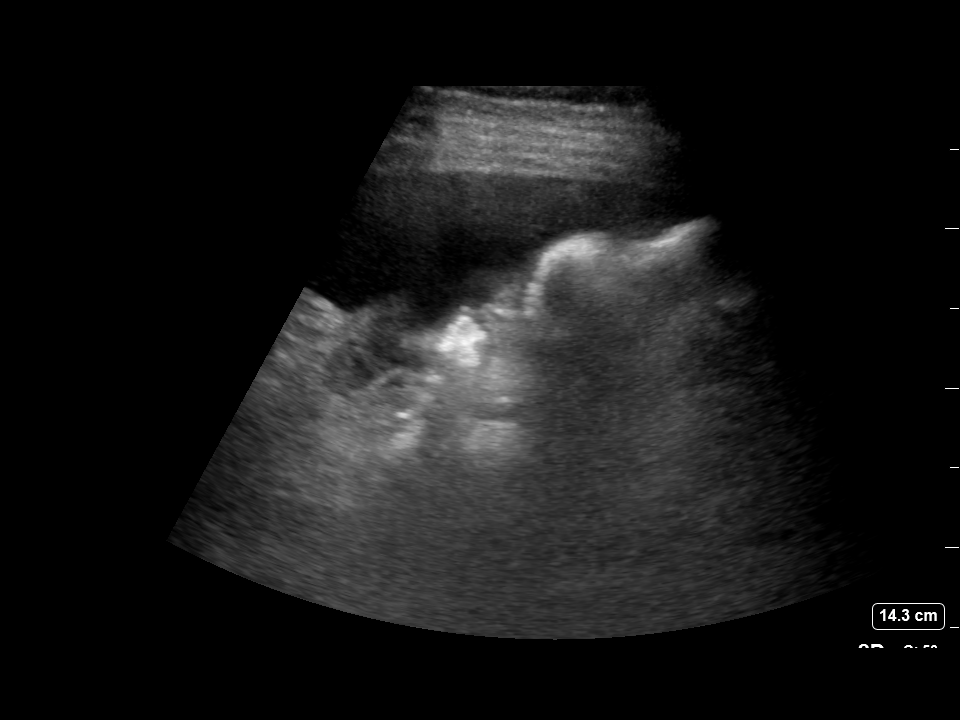
[im 2/3]
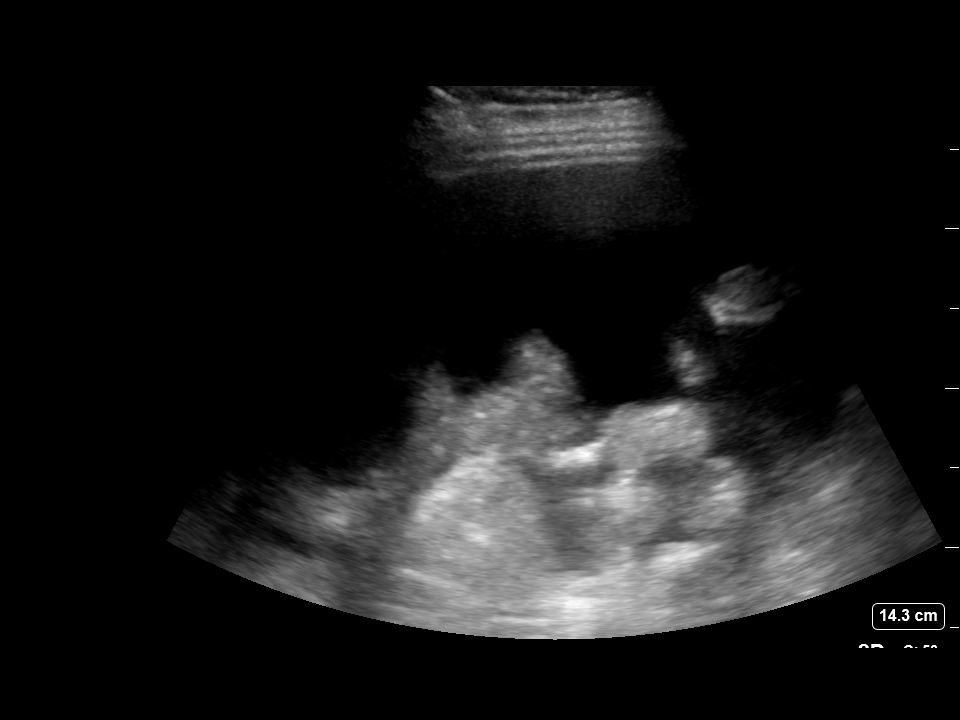
[im 3/3]
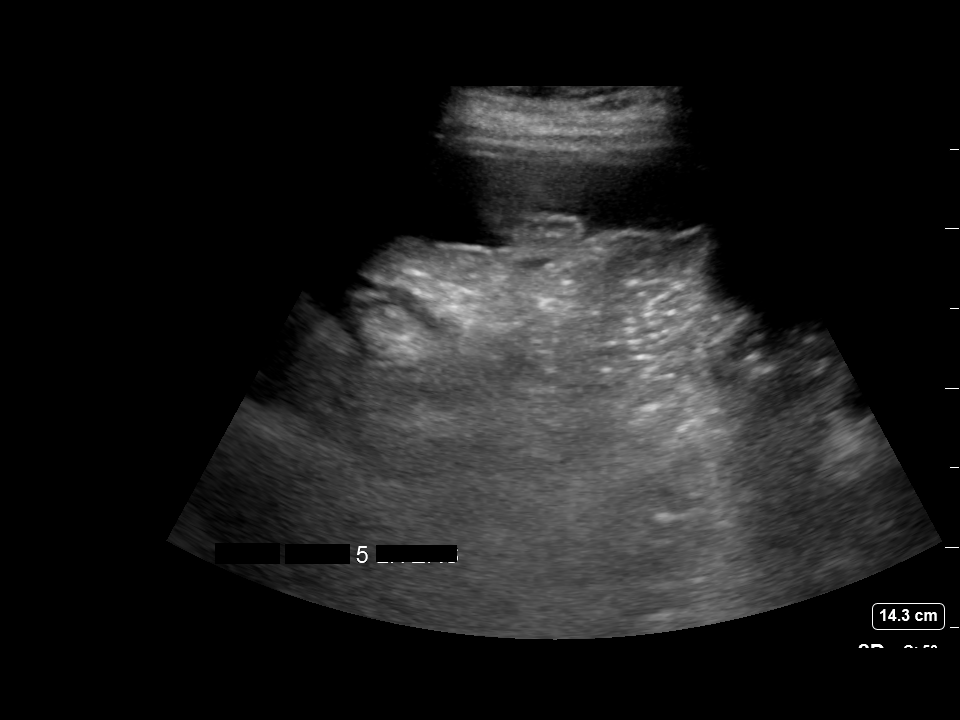

[3 of 3 positions shown; findings below may reference images not displayed]

EXAM:
ULTRASOUND GUIDED DIAGNOSTIC AND THERAPEUTIC PARACENTESIS

MEDICATIONS:
10 mL 1% lidocaine

COMPLICATIONS:
None immediate.

PROCEDURE:
Informed written consent was obtained from the patient after a
discussion of the risks, benefits and alternatives to treatment. A
timeout was performed prior to the initiation of the procedure.

Initial ultrasound scanning demonstrates a large amount of ascites
within the left lower abdominal quadrant. The left lower abdomen was
prepped and draped in the usual sterile fashion. 1% lidocaine was
used for local anesthesia.

Following this, a 19 gauge, 10-cm, Yueh catheter was introduced. An
ultrasound image was saved for documentation purposes. The
paracentesis was performed. The catheter was removed and a dressing
was applied. The patient tolerated the procedure well without
immediate post procedural complication.
FINDINGS: A total of approximately 5.0 L of clear yellow fluid was removed.
Samples were sent to the laboratory as requested by the clinical
team.
IMPRESSION: Successful ultrasound-guided paracentesis yielding 5.0 liters of
peritoneal fluid.

Read by Tiger, Apple

## 2022-07-09 IMAGING — US IR PARACENTESIS
1 series · 3 of 3 positions shown · non-contrast
Comparison: none

INDICATION: Hepatocellular carcinoma and NASH cirrhosis with recurrent ascites.
Request for diagnostic and therapeutic paracentesis.

[Series 1: ir (person_name)/(person_name) · 3 of 3 slices shown]
[im 1/3]
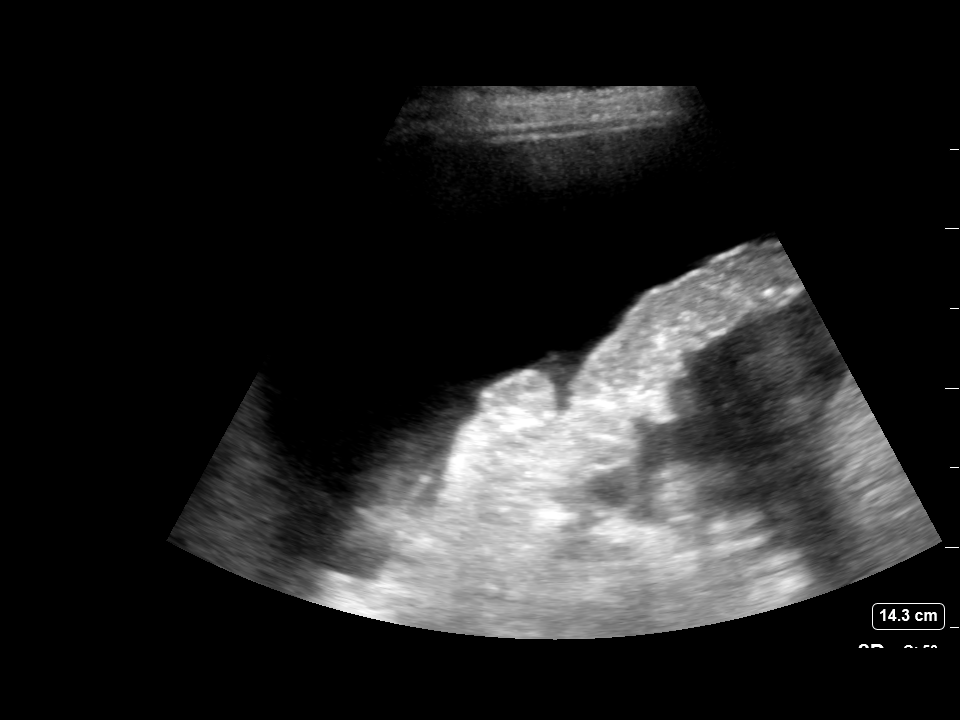
[im 2/3]
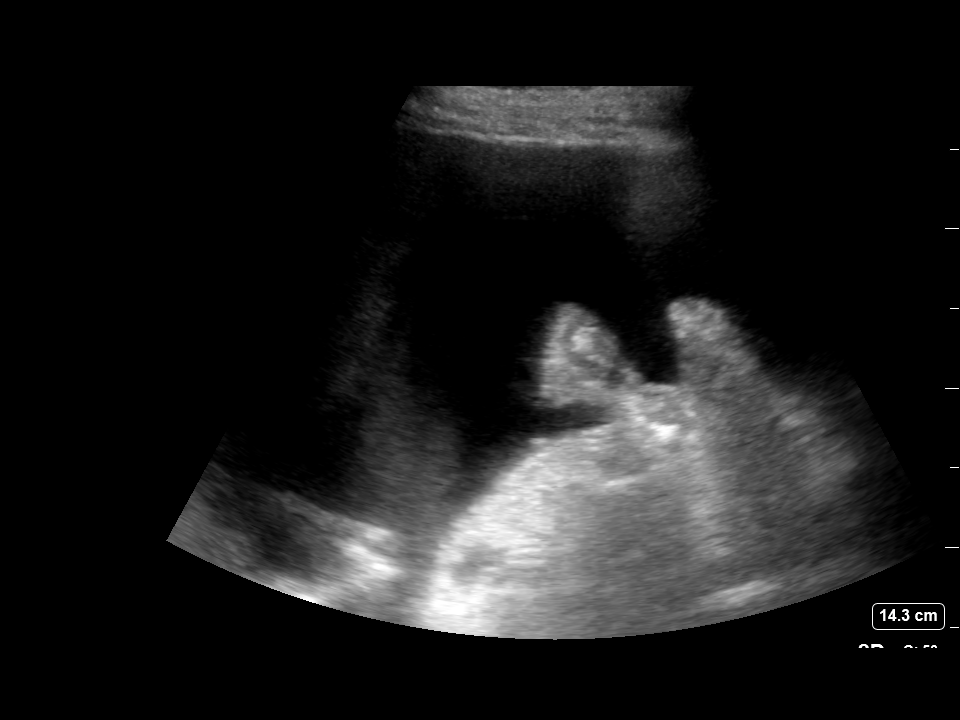
[im 3/3]
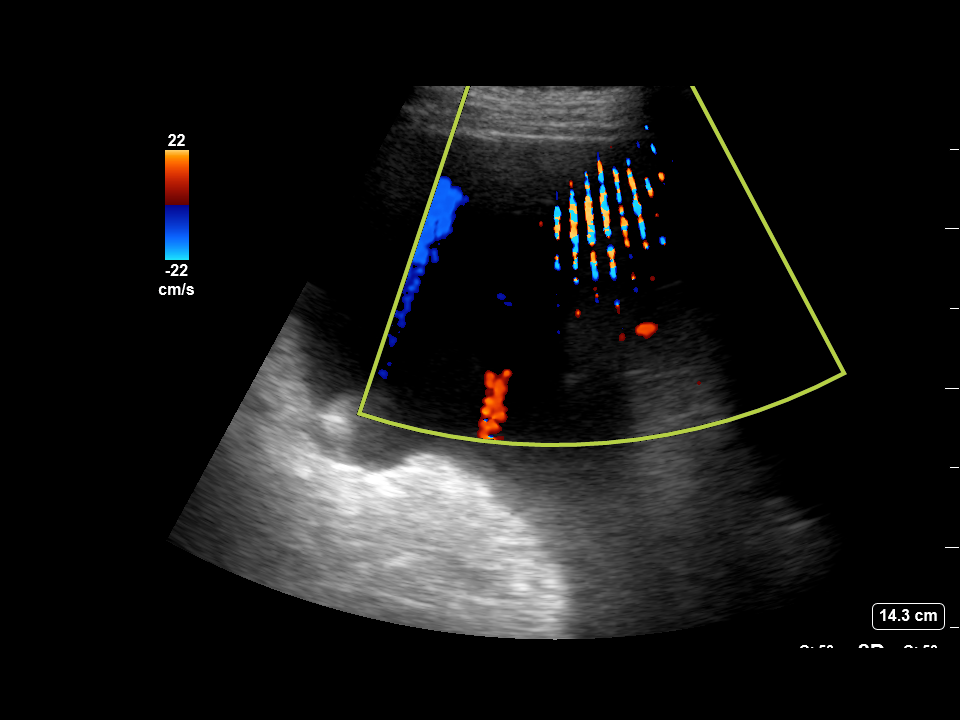

[3 of 3 positions shown; findings below may reference images not displayed]

EXAM:
ULTRASOUND GUIDED PARACENTESIS

MEDICATIONS:
1% lidocaine 10 mL

COMPLICATIONS:
None immediate.

PROCEDURE:
Informed written consent was obtained from the patient after a
discussion of the risks, benefits and alternatives to treatment. A
timeout was performed prior to the initiation of the procedure.

Initial ultrasound scanning demonstrates a large amount of ascites
within the right lower abdominal quadrant. The right lower abdomen
was prepped and draped in the usual sterile fashion. 1% lidocaine
was used for local anesthesia.

Following this, a 19 gauge, 7-cm, Yueh catheter was introduced. An
ultrasound image was saved for documentation purposes. The
paracentesis was performed. The catheter was removed and a dressing
was applied. The patient tolerated the procedure well without
immediate post procedural complication.
Patient received post-procedure intravenous albumin; see nursing
notes for details.
FINDINGS: A total of approximately 6.9 L of clear yellow fluid was removed.
IMPRESSION: Successful ultrasound-guided paracentesis yielding 6.9 liters of
peritoneal fluid.

## 2022-08-14 IMAGING — US IR PARACENTESIS
1 series · 3 of 3 positions shown · non-contrast
Comparison: none

INDICATION: History of hepatocellular carcinoma, Nash cirrhosis, and recurrent
ascites. Request for diagnostic and therapeutic paracentesis up to 8

[Series 1: ir (person_name)/(person_name) · 3 of 3 slices shown]
[im 1/3]
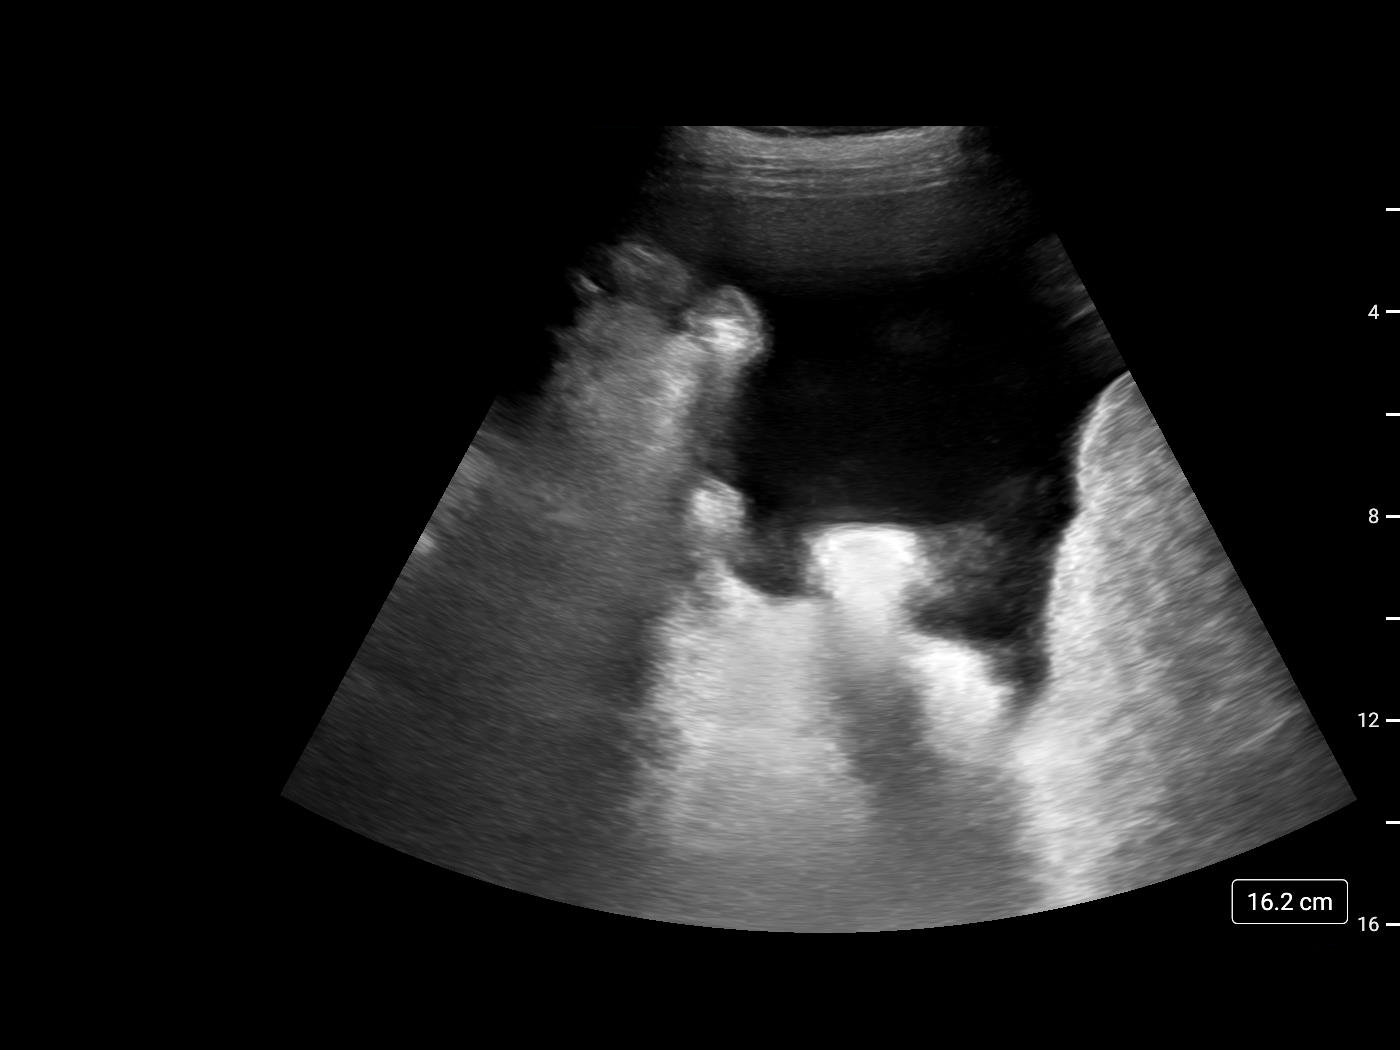
[im 2/3]
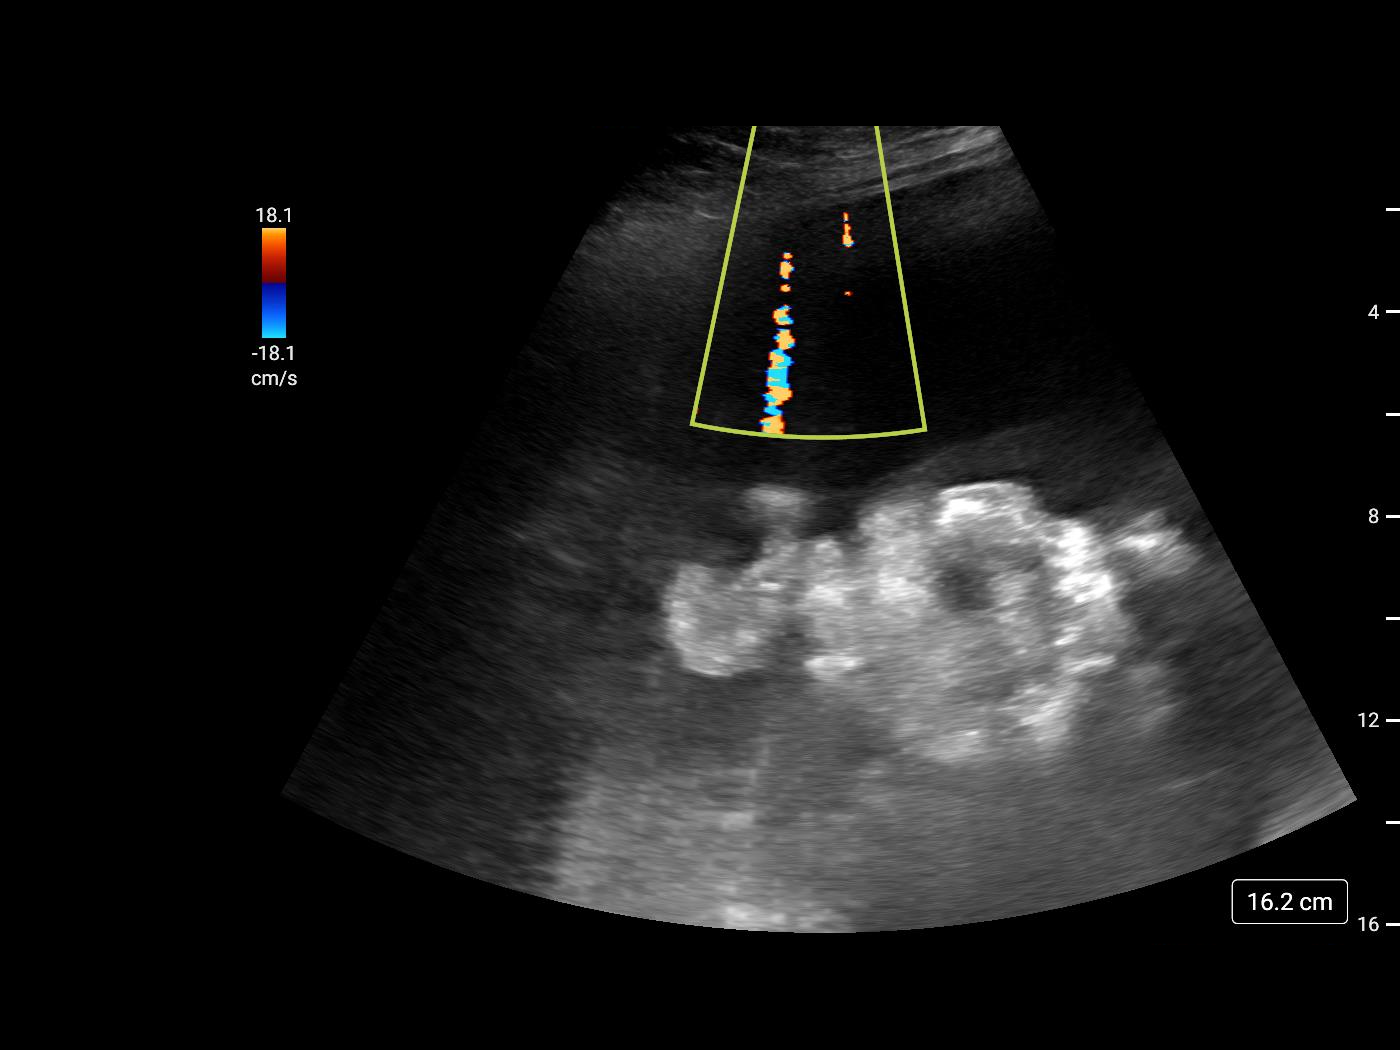
[im 3/3]
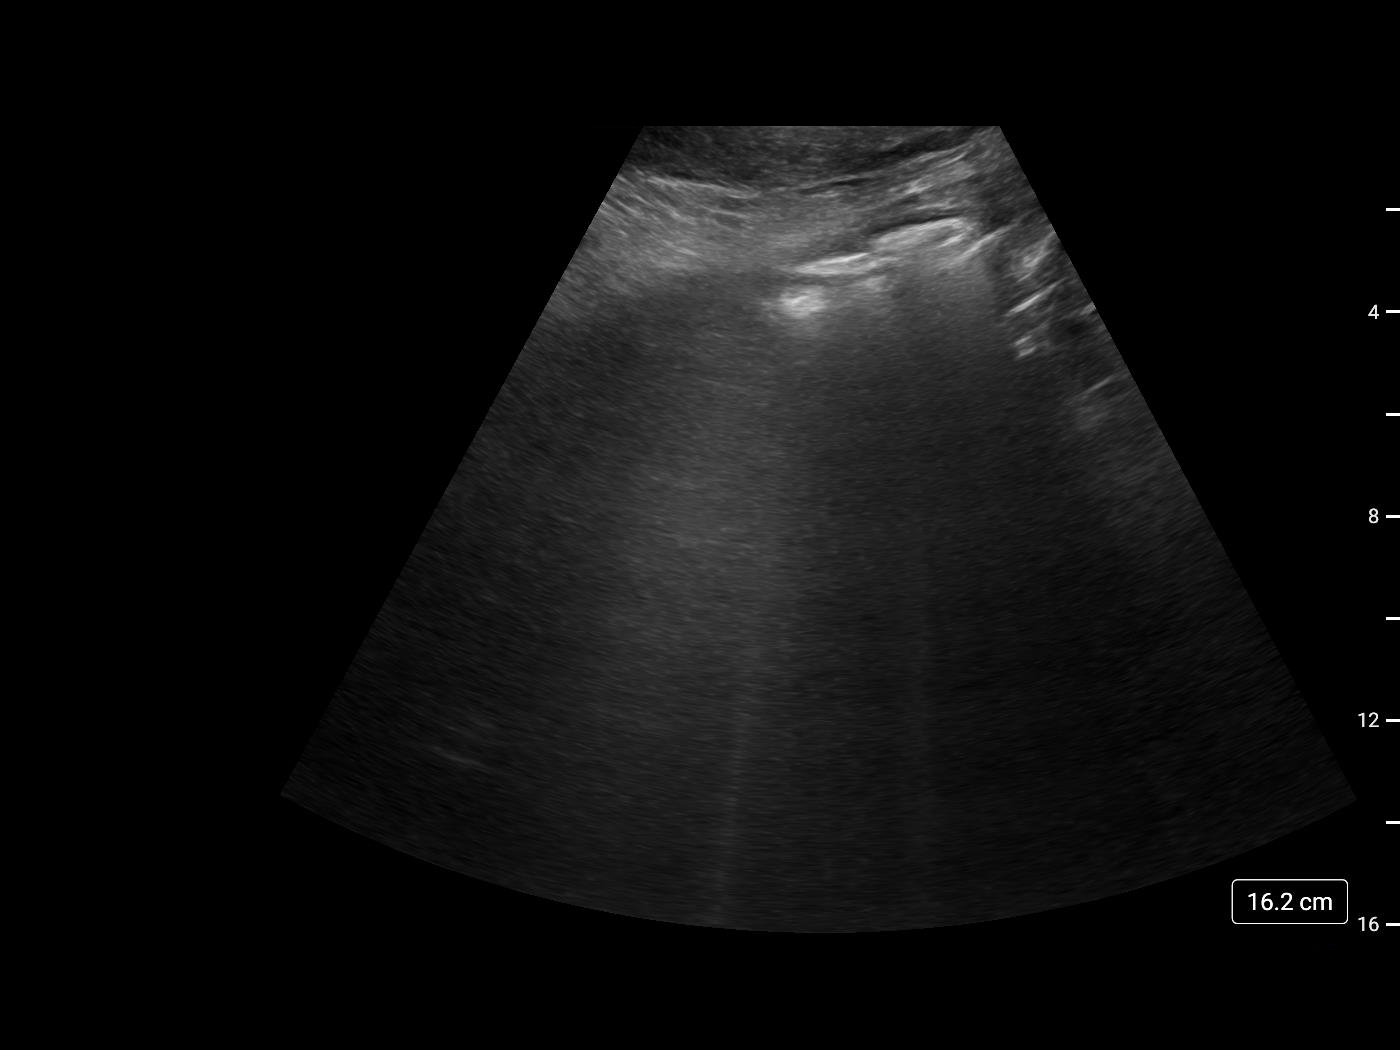

[3 of 3 positions shown; findings below may reference images not displayed]

EXAM:
ULTRASOUND GUIDED PARACENTESIS

MEDICATIONS:
1% lidocaine 9 mL

COMPLICATIONS:
None immediate.

PROCEDURE:
Informed written consent was obtained from the patient after a
discussion of the risks, benefits and alternatives to treatment. A
timeout was performed prior to the initiation of the procedure.

Initial ultrasound scanning demonstrates a large amount of ascites
within the left lower abdominal quadrant. The left lower abdomen was
prepped and draped in the usual sterile fashion. 1% lidocaine was
used for local anesthesia.

Following this, a 6 Fr Safe-T-Centesis catheter was introduced. An
ultrasound image was saved for documentation purposes. The
paracentesis was performed. The catheter was removed and a dressing
was applied. The patient tolerated the procedure well without
immediate post procedural complication.
Patient received post-procedure intravenous albumin; see nursing
notes for details.
FINDINGS: A total of approximately 7.2 L of cloudy yellow fluid was removed.
Samples were sent to the laboratory as requested by the clinical
team.
IMPRESSION: Successful ultrasound-guided paracentesis yielding 7.2 liters of
peritoneal fluid.

## 2022-08-25 IMAGING — US IR PARACENTESIS
1 series · 2 of 2 positions shown · non-contrast
Comparison: none

INDICATION: Patient with a history of hepatocellular carcinoma, NASH cirrhosis
and recurrent large volume ascites. Interventional radiology asked
to perform a therapeutic paracentesis up to 8 L.

[Series 1: ir (person_name)/(person_name) · 2 of 2 slices shown]
[im 1/2]
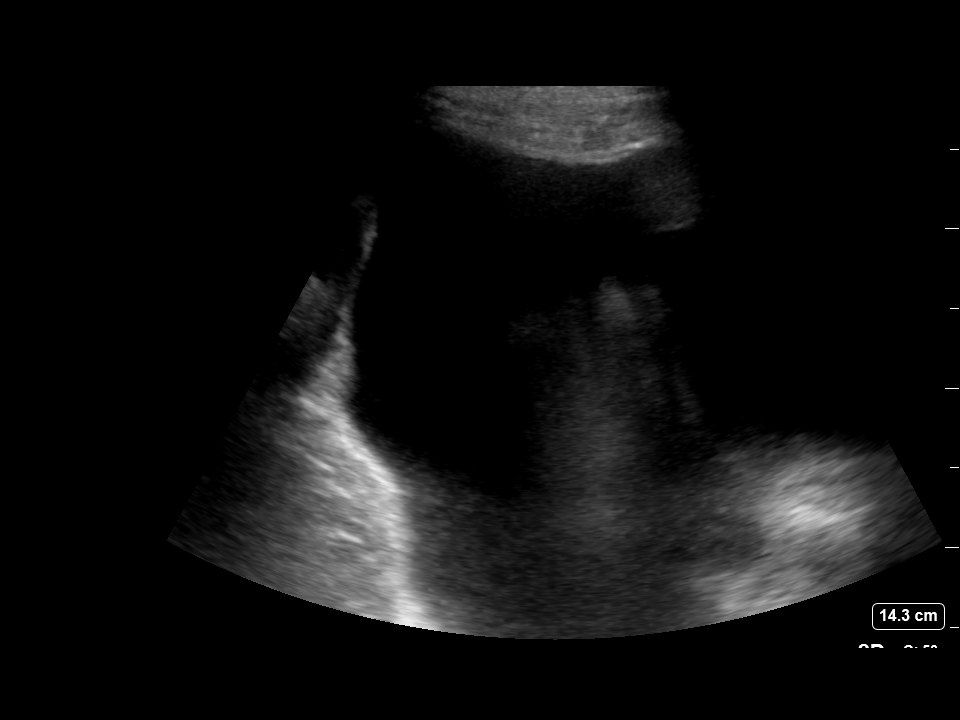
[im 2/2]
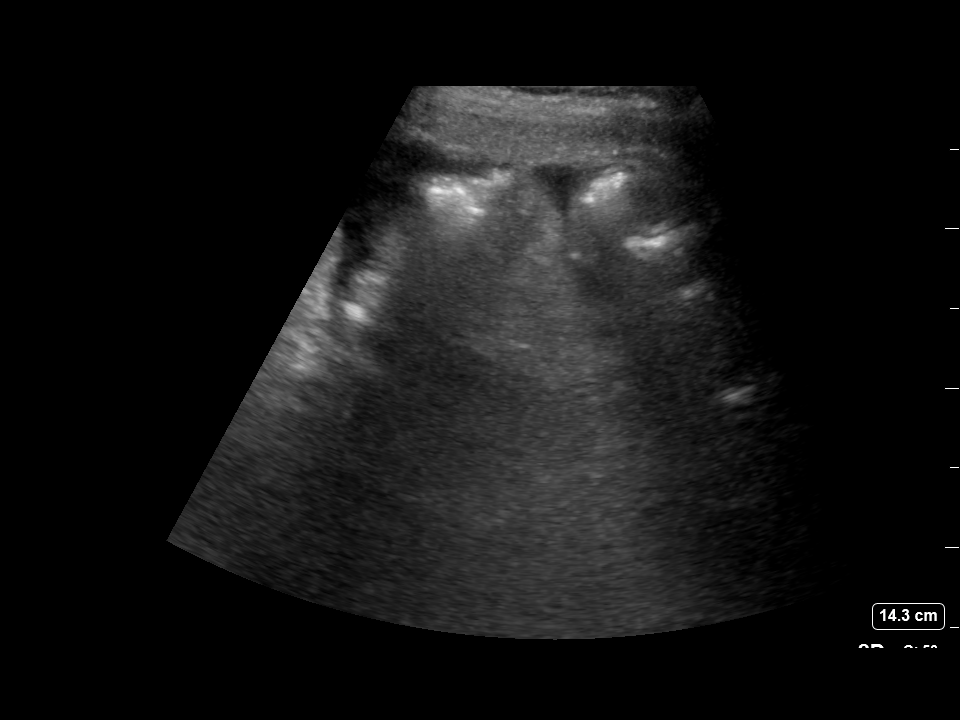

[2 of 2 positions shown; findings below may reference images not displayed]

EXAM:
ULTRASOUND GUIDED PARACENTESIS

MEDICATIONS:
1% lidocaine 10 mL

COMPLICATIONS:
None immediate.

PROCEDURE:
Informed written consent was obtained from the patient after a
discussion of the risks, benefits and alternatives to treatment. A
timeout was performed prior to the initiation of the procedure.

Initial ultrasound scanning demonstrates a large amount of ascites
within the right lower abdominal quadrant. The right lower abdomen
was prepped and draped in the usual sterile fashion. 1% lidocaine
was used for local anesthesia.

Following this, a 19 gauge, 7-cm, Yueh catheter was introduced. An
ultrasound image was saved for documentation purposes. The
paracentesis was performed. The catheter was removed and a dressing
was applied. The patient tolerated the procedure well without
immediate post procedural complication.
Patient received post-procedure intravenous albumin; see nursing
notes for details.
FINDINGS: A total of approximately 7.2 L of clear yellow fluid was removed.
IMPRESSION: Successful ultrasound-guided paracentesis yielding 7.2 liters of
peritoneal fluid. Read by: Breon Mariani, NP
